# Patient Record
Sex: Female | Born: 1940 | Race: Black or African American | Hispanic: No | Smoking: Former smoker
Health system: Southern US, Community
[De-identification: ages and names within clinical notes are randomized; demographics above are authoritative.]

## PROBLEM LIST (undated history)

## (undated) DIAGNOSIS — E119 Type 2 diabetes mellitus without complications: Secondary | ICD-10-CM

## (undated) DIAGNOSIS — O0091 Unspecified ectopic pregnancy with intrauterine pregnancy: Secondary | ICD-10-CM

## (undated) DIAGNOSIS — I82409 Acute embolism and thrombosis of unspecified deep veins of unspecified lower extremity: Secondary | ICD-10-CM

## (undated) DIAGNOSIS — H9190 Unspecified hearing loss, unspecified ear: Secondary | ICD-10-CM

## (undated) DIAGNOSIS — K449 Diaphragmatic hernia without obstruction or gangrene: Secondary | ICD-10-CM

## (undated) DIAGNOSIS — F419 Anxiety disorder, unspecified: Secondary | ICD-10-CM

## (undated) DIAGNOSIS — E785 Hyperlipidemia, unspecified: Secondary | ICD-10-CM

## (undated) DIAGNOSIS — F329 Major depressive disorder, single episode, unspecified: Secondary | ICD-10-CM

## (undated) DIAGNOSIS — F32A Depression, unspecified: Secondary | ICD-10-CM

## (undated) DIAGNOSIS — M549 Dorsalgia, unspecified: Secondary | ICD-10-CM

## (undated) DIAGNOSIS — T8859XA Other complications of anesthesia, initial encounter: Secondary | ICD-10-CM

## (undated) DIAGNOSIS — G4730 Sleep apnea, unspecified: Secondary | ICD-10-CM

## (undated) DIAGNOSIS — I2699 Other pulmonary embolism without acute cor pulmonale: Secondary | ICD-10-CM

## (undated) DIAGNOSIS — R0981 Nasal congestion: Secondary | ICD-10-CM

## (undated) HISTORY — DX: Nasal congestion: R09.81

## (undated) HISTORY — DX: Type 2 diabetes mellitus without complications: E11.9

## (undated) HISTORY — PX: ECTOPIC PREGNANCY SURGERY: SHX613

## (undated) HISTORY — DX: Unspecified hearing loss, unspecified ear: H91.90

## (undated) HISTORY — DX: Sleep apnea, unspecified: G47.30

## (undated) HISTORY — PX: BUNIONECTOMY: SHX129

## (undated) HISTORY — PX: TUBAL LIGATION: SHX77

## (undated) HISTORY — DX: Other pulmonary embolism without acute cor pulmonale: I26.99

## (undated) HISTORY — DX: Major depressive disorder, single episode, unspecified: F32.9

## (undated) HISTORY — DX: Unspecified ectopic pregnancy with intrauterine pregnancy: O00.91

## (undated) HISTORY — DX: Depression, unspecified: F32.A

## (undated) HISTORY — PX: ABDOMINAL EXPLORATION SURGERY: SHX538

## (undated) HISTORY — DX: Anxiety disorder, unspecified: F41.9

## (undated) HISTORY — DX: Dorsalgia, unspecified: M54.9

## (undated) HISTORY — DX: Acute embolism and thrombosis of unspecified deep veins of unspecified lower extremity: I82.409

## (undated) HISTORY — DX: Diaphragmatic hernia without obstruction or gangrene: K44.9

## (undated) HISTORY — PX: FOOT SURGERY: SHX648

## (undated) HISTORY — DX: Hyperlipidemia, unspecified: E78.5

---

## 1983-02-20 HISTORY — PX: BUNIONECTOMY: SHX129

## 1983-02-20 HISTORY — PX: FOOT SURGERY: SHX648

## 1999-01-03 ENCOUNTER — Ambulatory Visit (HOSPITAL_COMMUNITY): Admission: RE | Admit: 1999-01-03 | Discharge: 1999-01-03 | Payer: Self-pay | Admitting: Internal Medicine

## 2000-04-30 ENCOUNTER — Other Ambulatory Visit: Admission: RE | Admit: 2000-04-30 | Discharge: 2000-04-30 | Payer: Self-pay | Admitting: Family Medicine

## 2000-05-01 ENCOUNTER — Encounter (INDEPENDENT_AMBULATORY_CARE_PROVIDER_SITE_OTHER): Payer: Self-pay

## 2000-05-01 ENCOUNTER — Other Ambulatory Visit: Admission: RE | Admit: 2000-05-01 | Discharge: 2000-05-01 | Payer: Self-pay | Admitting: Obstetrics & Gynecology

## 2001-05-16 ENCOUNTER — Other Ambulatory Visit: Admission: RE | Admit: 2001-05-16 | Discharge: 2001-05-16 | Payer: Self-pay | Admitting: Obstetrics & Gynecology

## 2002-05-21 ENCOUNTER — Other Ambulatory Visit: Admission: RE | Admit: 2002-05-21 | Discharge: 2002-05-21 | Payer: Self-pay | Admitting: Obstetrics & Gynecology

## 2002-10-08 ENCOUNTER — Encounter (INDEPENDENT_AMBULATORY_CARE_PROVIDER_SITE_OTHER): Payer: Self-pay | Admitting: Specialist

## 2002-10-08 ENCOUNTER — Ambulatory Visit (HOSPITAL_COMMUNITY): Admission: RE | Admit: 2002-10-08 | Discharge: 2002-10-08 | Payer: Self-pay | Admitting: Obstetrics & Gynecology

## 2003-06-22 ENCOUNTER — Other Ambulatory Visit: Admission: RE | Admit: 2003-06-22 | Discharge: 2003-06-22 | Payer: Self-pay | Admitting: Obstetrics & Gynecology

## 2003-12-12 ENCOUNTER — Emergency Department (HOSPITAL_COMMUNITY): Admission: EM | Admit: 2003-12-12 | Discharge: 2003-12-12 | Payer: Self-pay | Admitting: Family Medicine

## 2004-02-20 DIAGNOSIS — I82409 Acute embolism and thrombosis of unspecified deep veins of unspecified lower extremity: Secondary | ICD-10-CM

## 2004-02-20 DIAGNOSIS — I2699 Other pulmonary embolism without acute cor pulmonale: Secondary | ICD-10-CM

## 2004-02-20 HISTORY — DX: Other pulmonary embolism without acute cor pulmonale: I26.99

## 2004-02-20 HISTORY — DX: Acute embolism and thrombosis of unspecified deep veins of unspecified lower extremity: I82.409

## 2004-02-24 ENCOUNTER — Ambulatory Visit: Payer: Self-pay | Admitting: Internal Medicine

## 2004-02-28 ENCOUNTER — Encounter: Admission: RE | Admit: 2004-02-28 | Discharge: 2004-02-28 | Payer: Self-pay | Admitting: Internal Medicine

## 2004-03-01 ENCOUNTER — Ambulatory Visit: Payer: Self-pay

## 2004-03-09 ENCOUNTER — Ambulatory Visit: Payer: Self-pay | Admitting: Internal Medicine

## 2004-03-15 ENCOUNTER — Ambulatory Visit: Payer: Self-pay | Admitting: Internal Medicine

## 2004-03-20 ENCOUNTER — Ambulatory Visit: Payer: Self-pay | Admitting: Internal Medicine

## 2004-04-06 ENCOUNTER — Ambulatory Visit: Payer: Self-pay | Admitting: Family Medicine

## 2004-04-19 ENCOUNTER — Ambulatory Visit: Payer: Self-pay | Admitting: Internal Medicine

## 2004-04-21 ENCOUNTER — Inpatient Hospital Stay (HOSPITAL_COMMUNITY): Admission: EM | Admit: 2004-04-21 | Discharge: 2004-04-27 | Payer: Self-pay | Admitting: Emergency Medicine

## 2004-04-21 ENCOUNTER — Ambulatory Visit: Payer: Self-pay | Admitting: Internal Medicine

## 2004-04-21 ENCOUNTER — Encounter: Admission: RE | Admit: 2004-04-21 | Discharge: 2004-04-21 | Payer: Self-pay | Admitting: Internal Medicine

## 2004-05-01 ENCOUNTER — Ambulatory Visit: Payer: Self-pay | Admitting: Cardiology

## 2004-05-04 ENCOUNTER — Ambulatory Visit: Payer: Self-pay | Admitting: Internal Medicine

## 2004-05-08 ENCOUNTER — Ambulatory Visit: Payer: Self-pay | Admitting: Internal Medicine

## 2004-05-15 ENCOUNTER — Ambulatory Visit: Payer: Self-pay | Admitting: Cardiology

## 2004-05-24 ENCOUNTER — Ambulatory Visit: Payer: Self-pay | Admitting: *Deleted

## 2004-06-01 ENCOUNTER — Ambulatory Visit: Payer: Self-pay | Admitting: Internal Medicine

## 2004-06-01 ENCOUNTER — Ambulatory Visit: Payer: Self-pay | Admitting: Cardiology

## 2004-06-15 ENCOUNTER — Ambulatory Visit: Payer: Self-pay | Admitting: Cardiology

## 2004-06-26 ENCOUNTER — Ambulatory Visit: Payer: Self-pay | Admitting: Family Medicine

## 2004-07-05 ENCOUNTER — Ambulatory Visit: Payer: Self-pay | Admitting: Cardiology

## 2004-07-20 ENCOUNTER — Ambulatory Visit: Payer: Self-pay | Admitting: Internal Medicine

## 2004-07-26 ENCOUNTER — Other Ambulatory Visit: Admission: RE | Admit: 2004-07-26 | Discharge: 2004-07-26 | Payer: Self-pay | Admitting: Obstetrics & Gynecology

## 2004-08-02 ENCOUNTER — Ambulatory Visit: Payer: Self-pay | Admitting: *Deleted

## 2004-08-15 ENCOUNTER — Ambulatory Visit: Payer: Self-pay | Admitting: Cardiology

## 2004-09-12 ENCOUNTER — Ambulatory Visit: Payer: Self-pay | Admitting: Cardiology

## 2004-09-27 ENCOUNTER — Ambulatory Visit: Payer: Self-pay | Admitting: Cardiology

## 2004-10-11 ENCOUNTER — Ambulatory Visit: Payer: Self-pay | Admitting: Cardiology

## 2004-10-24 ENCOUNTER — Ambulatory Visit: Admission: RE | Admit: 2004-10-24 | Discharge: 2004-10-24 | Payer: Self-pay | Admitting: Internal Medicine

## 2004-10-24 ENCOUNTER — Ambulatory Visit: Payer: Self-pay | Admitting: Internal Medicine

## 2004-10-30 ENCOUNTER — Ambulatory Visit: Payer: Self-pay | Admitting: Family Medicine

## 2004-11-01 ENCOUNTER — Ambulatory Visit: Payer: Self-pay | Admitting: Cardiology

## 2004-11-13 ENCOUNTER — Ambulatory Visit: Payer: Self-pay | Admitting: Family Medicine

## 2004-11-17 ENCOUNTER — Ambulatory Visit: Payer: Self-pay | Admitting: Cardiology

## 2004-11-17 ENCOUNTER — Ambulatory Visit: Payer: Self-pay | Admitting: Internal Medicine

## 2004-12-01 ENCOUNTER — Encounter: Admission: RE | Admit: 2004-12-01 | Discharge: 2004-12-01 | Payer: Self-pay | Admitting: *Deleted

## 2004-12-01 ENCOUNTER — Ambulatory Visit: Payer: Self-pay

## 2004-12-04 ENCOUNTER — Ambulatory Visit (HOSPITAL_COMMUNITY): Admission: RE | Admit: 2004-12-04 | Discharge: 2004-12-04 | Payer: Self-pay | Admitting: *Deleted

## 2004-12-04 ENCOUNTER — Ambulatory Visit (HOSPITAL_BASED_OUTPATIENT_CLINIC_OR_DEPARTMENT_OTHER): Admission: RE | Admit: 2004-12-04 | Discharge: 2004-12-04 | Payer: Self-pay | Admitting: *Deleted

## 2004-12-14 ENCOUNTER — Ambulatory Visit: Payer: Self-pay | Admitting: Cardiology

## 2004-12-20 ENCOUNTER — Ambulatory Visit: Payer: Self-pay | Admitting: Cardiology

## 2004-12-20 ENCOUNTER — Ambulatory Visit: Payer: Self-pay | Admitting: *Deleted

## 2004-12-26 ENCOUNTER — Ambulatory Visit: Payer: Self-pay | Admitting: Cardiology

## 2004-12-26 ENCOUNTER — Ambulatory Visit (HOSPITAL_COMMUNITY): Admission: RE | Admit: 2004-12-26 | Discharge: 2004-12-26 | Payer: Self-pay | Admitting: Cardiology

## 2004-12-29 ENCOUNTER — Ambulatory Visit: Payer: Self-pay | Admitting: Internal Medicine

## 2004-12-29 ENCOUNTER — Ambulatory Visit (HOSPITAL_COMMUNITY): Admission: RE | Admit: 2004-12-29 | Discharge: 2004-12-29 | Payer: Self-pay | Admitting: Cardiology

## 2005-01-03 ENCOUNTER — Ambulatory Visit: Payer: Self-pay | Admitting: Cardiology

## 2005-01-08 ENCOUNTER — Ambulatory Visit: Payer: Self-pay | Admitting: Internal Medicine

## 2005-01-15 ENCOUNTER — Ambulatory Visit: Payer: Self-pay | Admitting: Cardiology

## 2005-01-18 ENCOUNTER — Ambulatory Visit: Payer: Self-pay | Admitting: Internal Medicine

## 2005-02-08 ENCOUNTER — Ambulatory Visit: Payer: Self-pay | Admitting: Cardiology

## 2005-03-13 ENCOUNTER — Ambulatory Visit: Payer: Self-pay | Admitting: *Deleted

## 2005-04-11 ENCOUNTER — Ambulatory Visit: Payer: Self-pay | Admitting: Internal Medicine

## 2005-05-14 ENCOUNTER — Ambulatory Visit: Payer: Self-pay | Admitting: Internal Medicine

## 2005-06-12 ENCOUNTER — Ambulatory Visit: Payer: Self-pay | Admitting: Cardiovascular Disease

## 2005-07-13 ENCOUNTER — Ambulatory Visit: Payer: Self-pay | Admitting: Cardiology

## 2005-07-19 ENCOUNTER — Ambulatory Visit: Payer: Self-pay | Admitting: Cardiology

## 2005-08-14 ENCOUNTER — Ambulatory Visit: Payer: Self-pay | Admitting: Cardiovascular Disease

## 2005-08-30 ENCOUNTER — Ambulatory Visit: Payer: Self-pay | Admitting: Cardiology

## 2005-09-13 ENCOUNTER — Ambulatory Visit: Payer: Self-pay | Admitting: Internal Medicine

## 2005-10-04 ENCOUNTER — Ambulatory Visit: Payer: Self-pay | Admitting: Cardiology

## 2005-10-19 ENCOUNTER — Ambulatory Visit: Payer: Self-pay | Admitting: Internal Medicine

## 2005-10-19 ENCOUNTER — Ambulatory Visit: Payer: Self-pay | Admitting: Cardiology

## 2005-11-08 ENCOUNTER — Ambulatory Visit: Payer: Self-pay | Admitting: Internal Medicine

## 2005-11-08 ENCOUNTER — Ambulatory Visit: Payer: Self-pay | Admitting: Cardiology

## 2005-11-22 ENCOUNTER — Encounter: Admission: RE | Admit: 2005-11-22 | Discharge: 2005-11-22 | Payer: Self-pay | Admitting: Obstetrics & Gynecology

## 2006-06-12 ENCOUNTER — Ambulatory Visit: Payer: Self-pay | Admitting: Internal Medicine

## 2006-06-12 LAB — CONVERTED CEMR LAB
ALT: 29 units/L (ref 0–40)
AST: 29 units/L (ref 0–37)
Cholesterol: 236 mg/dL (ref 0–200)
Direct LDL: 97.5 mg/dL
HDL: 111 mg/dL (ref >39.0)
Total CHOL/HDL Ratio: 2.1
Triglycerides: 59 mg/dL (ref 0–149)
VLDL: 12 mg/dL (ref 0–40)

## 2006-08-20 ENCOUNTER — Ambulatory Visit: Payer: Self-pay | Admitting: Family Medicine

## 2006-08-20 DIAGNOSIS — O0081 Other ectopic pregnancy with intrauterine pregnancy: Secondary | ICD-10-CM

## 2006-08-20 DIAGNOSIS — R350 Frequency of micturition: Secondary | ICD-10-CM

## 2006-08-20 DIAGNOSIS — R7989 Other specified abnormal findings of blood chemistry: Secondary | ICD-10-CM

## 2006-08-20 DIAGNOSIS — Z86718 Personal history of other venous thrombosis and embolism: Secondary | ICD-10-CM

## 2006-08-20 DIAGNOSIS — E785 Hyperlipidemia, unspecified: Secondary | ICD-10-CM

## 2006-08-20 LAB — CONVERTED CEMR LAB
Bilirubin Urine: NEGATIVE
Blood Glucose, Fasting: 107 mg/dL
Blood in Urine, dipstick: NEGATIVE
Glucose, Urine, Semiquant: NEGATIVE
Ketones, urine, test strip: NEGATIVE
Nitrite: NEGATIVE
Protein, U semiquant: NEGATIVE
Specific Gravity, Urine: 1.01
Urobilinogen, UA: NEGATIVE
pH: 6

## 2006-08-21 LAB — CONVERTED CEMR LAB
ALT: 26 units/L (ref 0–35)
AST: 23 units/L (ref 0–37)
Albumin: 3.8 g/dL (ref 3.5–5.2)
Alkaline Phosphatase: 63 units/L (ref 39–117)
BUN: 18 mg/dL (ref 6–23)
Basophils Absolute: 0 10*3/uL (ref 0.0–0.1)
Basophils Relative: 1 % (ref 0.0–1.0)
Bilirubin, Direct: 0.1 mg/dL (ref 0.0–0.3)
CO2: 32 meq/L (ref 19–32)
Calcium: 9.5 mg/dL (ref 8.4–10.5)
Chloride: 111 meq/L (ref 96–112)
Cholesterol: 234 mg/dL (ref 0–200)
Creatinine, Ser: 1 mg/dL (ref 0.4–1.2)
Direct LDL: 84.8 mg/dL
Eosinophils Absolute: 0.1 10*3/uL (ref 0.0–0.6)
Eosinophils Relative: 2.6 % (ref 0.0–5.0)
GFR calc Af Amer: 72 mL/min
GFR calc non Af Amer: 59 mL/min
Glucose, Bld: 91 mg/dL (ref 70–99)
HCT: 41 % (ref 36.0–46.0)
HDL: 131.5 mg/dL (ref >39.0)
Hemoglobin: 13.5 g/dL (ref 12.0–15.0)
Hgb A1c MFr Bld: 6.5 % — ABNORMAL HIGH (ref 4.6–6.0)
Lymphocytes Relative: 44 % (ref 12.0–46.0)
MCHC: 33 g/dL (ref 30.0–36.0)
MCV: 94.5 fL (ref 78.0–100.0)
Monocytes Absolute: 0.4 10*3/uL (ref 0.2–0.7)
Monocytes Relative: 9 % (ref 3.0–11.0)
Neutro Abs: 2.1 10*3/uL (ref 1.4–7.7)
Neutrophils Relative %: 43.4 % (ref 43.0–77.0)
Platelets: 244 10*3/uL (ref 150–400)
Potassium: 4.4 meq/L (ref 3.5–5.1)
RBC: 4.33 M/uL (ref 3.87–5.11)
RDW: 12.7 % (ref 11.5–14.6)
Sodium: 145 meq/L (ref 135–145)
TSH: 1.16 microintl units/mL (ref 0.35–5.50)
Total Bilirubin: 0.9 mg/dL (ref 0.3–1.2)
Total CHOL/HDL Ratio: 1.8
Total Protein: 7 g/dL (ref 6.0–8.3)
Triglycerides: 59 mg/dL (ref 0–149)
VLDL: 12 mg/dL (ref 0–40)
WBC: 4.9 10*3/uL (ref 4.5–10.5)

## 2006-08-28 ENCOUNTER — Ambulatory Visit: Payer: Self-pay | Admitting: Family Medicine

## 2006-08-28 LAB — CONVERTED CEMR LAB
Bilirubin Urine: NEGATIVE
Blood in Urine, dipstick: NEGATIVE
Glucose, Urine, Semiquant: NEGATIVE
Ketones, urine, test strip: NEGATIVE
Nitrite: NEGATIVE
Protein, U semiquant: NEGATIVE
Specific Gravity, Urine: 1.01
Urobilinogen, UA: NEGATIVE
pH: 7

## 2006-09-02 ENCOUNTER — Telehealth (INDEPENDENT_AMBULATORY_CARE_PROVIDER_SITE_OTHER): Payer: Self-pay | Admitting: *Deleted

## 2006-09-04 ENCOUNTER — Encounter (INDEPENDENT_AMBULATORY_CARE_PROVIDER_SITE_OTHER): Payer: Self-pay | Admitting: *Deleted

## 2006-09-19 ENCOUNTER — Ambulatory Visit: Payer: Self-pay | Admitting: Family Medicine

## 2006-09-19 DIAGNOSIS — Z87440 Personal history of urinary (tract) infections: Secondary | ICD-10-CM | POA: Insufficient documentation

## 2006-09-19 LAB — CONVERTED CEMR LAB
Bilirubin Urine: NEGATIVE
Blood in Urine, dipstick: NEGATIVE
Glucose, Urine, Semiquant: NEGATIVE
Ketones, urine, test strip: NEGATIVE
Nitrite: NEGATIVE
Protein, U semiquant: NEGATIVE
Specific Gravity, Urine: 1.015
Urobilinogen, UA: NEGATIVE
pH: 6

## 2006-09-20 ENCOUNTER — Encounter: Payer: Self-pay | Admitting: Family Medicine

## 2006-09-23 ENCOUNTER — Encounter (INDEPENDENT_AMBULATORY_CARE_PROVIDER_SITE_OTHER): Payer: Self-pay | Admitting: *Deleted

## 2006-10-22 ENCOUNTER — Telehealth (INDEPENDENT_AMBULATORY_CARE_PROVIDER_SITE_OTHER): Payer: Self-pay | Admitting: *Deleted

## 2006-10-22 ENCOUNTER — Telehealth: Payer: Self-pay | Admitting: Family Medicine

## 2007-01-10 ENCOUNTER — Ambulatory Visit: Payer: Self-pay | Admitting: Family Medicine

## 2007-01-27 ENCOUNTER — Encounter: Payer: Self-pay | Admitting: Family Medicine

## 2007-02-18 ENCOUNTER — Ambulatory Visit: Payer: Self-pay | Admitting: Internal Medicine

## 2007-02-20 HISTORY — PX: COLONOSCOPY: SHX174

## 2007-02-24 ENCOUNTER — Telehealth (INDEPENDENT_AMBULATORY_CARE_PROVIDER_SITE_OTHER): Payer: Self-pay | Admitting: *Deleted

## 2007-02-25 ENCOUNTER — Ambulatory Visit: Payer: Self-pay | Admitting: Internal Medicine

## 2007-02-26 LAB — CONVERTED CEMR LAB
Basophils Absolute: 0 10*3/uL (ref 0.0–0.1)
Basophils Relative: 0.6 % (ref 0.0–1.0)
Eosinophils Absolute: 0.2 10*3/uL (ref 0.0–0.6)
Eosinophils Relative: 3.1 % (ref 0.0–5.0)
HCT: 38.9 % (ref 36.0–46.0)
Hemoglobin: 13.3 g/dL (ref 12.0–15.0)
Lymphocytes Relative: 40.9 % (ref 12.0–46.0)
MCHC: 34.3 g/dL (ref 30.0–36.0)
MCV: 94.8 fL (ref 78.0–100.0)
Monocytes Absolute: 0.5 10*3/uL (ref 0.2–0.7)
Monocytes Relative: 9.9 % (ref 3.0–11.0)
Neutro Abs: 2.4 10*3/uL (ref 1.4–7.7)
Neutrophils Relative %: 45.5 % (ref 43.0–77.0)
Platelets: 233 10*3/uL (ref 150–400)
RBC: 4.1 M/uL (ref 3.87–5.11)
RDW: 12.8 % (ref 11.5–14.6)
WBC: 5.3 10*3/uL (ref 4.5–10.5)

## 2007-03-03 ENCOUNTER — Telehealth (INDEPENDENT_AMBULATORY_CARE_PROVIDER_SITE_OTHER): Payer: Self-pay | Admitting: *Deleted

## 2007-03-04 ENCOUNTER — Encounter: Payer: Self-pay | Admitting: Family Medicine

## 2007-03-04 ENCOUNTER — Ambulatory Visit: Payer: Self-pay | Admitting: Internal Medicine

## 2007-05-29 ENCOUNTER — Encounter: Payer: Self-pay | Admitting: Internal Medicine

## 2007-07-16 ENCOUNTER — Ambulatory Visit: Payer: Self-pay | Admitting: Family Medicine

## 2007-07-27 LAB — CONVERTED CEMR LAB
BUN: 21 mg/dL (ref 6–23)
CO2: 28 meq/L (ref 19–32)
Calcium: 9.7 mg/dL (ref 8.4–10.5)
Chloride: 103 meq/L (ref 96–112)
Cholesterol: 246 mg/dL (ref 0–200)
Creatinine, Ser: 1.1 mg/dL (ref 0.4–1.2)
Direct LDL: 113.6 mg/dL
GFR calc Af Amer: 64 mL/min
GFR calc non Af Amer: 53 mL/min
Glucose, Bld: 109 mg/dL — ABNORMAL HIGH (ref 70–99)
HDL: 116.5 mg/dL (ref >39.0)
Potassium: 4.1 meq/L (ref 3.5–5.1)
Sodium: 140 meq/L (ref 135–145)
Total CHOL/HDL Ratio: 2.1
Triglycerides: 72 mg/dL (ref 0–149)
VLDL: 14 mg/dL (ref 0–40)

## 2007-07-28 ENCOUNTER — Encounter (INDEPENDENT_AMBULATORY_CARE_PROVIDER_SITE_OTHER): Payer: Self-pay | Admitting: *Deleted

## 2007-08-07 ENCOUNTER — Encounter: Payer: Self-pay | Admitting: Internal Medicine

## 2007-08-07 ENCOUNTER — Ambulatory Visit: Payer: Self-pay | Admitting: Family Medicine

## 2007-08-07 LAB — CONVERTED CEMR LAB
BUN: 14 mg/dL (ref 6–23)
Basophils Absolute: 0 10*3/uL (ref 0.0–0.1)
Basophils Relative: 0.5 % (ref 0.0–1.0)
CO2: 25 meq/L (ref 19–32)
Calcium: 9.6 mg/dL (ref 8.4–10.5)
Chloride: 105 meq/L (ref 96–112)
Creatinine, Ser: 1 mg/dL (ref 0.4–1.2)
Eosinophils Absolute: 0.2 10*3/uL (ref 0.0–0.7)
Eosinophils Relative: 3.3 % (ref 0.0–5.0)
GFR calc Af Amer: 72 mL/min
GFR calc non Af Amer: 59 mL/min
Glucose, Bld: 110 mg/dL — ABNORMAL HIGH (ref 70–99)
HCT: 39.6 % (ref 36.0–46.0)
Hemoglobin: 13.7 g/dL (ref 12.0–15.0)
Hgb A1c MFr Bld: 6.4 % — ABNORMAL HIGH (ref 4.6–6.0)
Lymphocytes Relative: 34.6 % (ref 12.0–46.0)
MCHC: 34.5 g/dL (ref 30.0–36.0)
MCV: 94.6 fL (ref 78.0–100.0)
Monocytes Absolute: 0.4 10*3/uL (ref 0.1–1.0)
Monocytes Relative: 8 % (ref 3.0–12.0)
Neutro Abs: 3 10*3/uL (ref 1.4–7.7)
Neutrophils Relative %: 53.6 % (ref 43.0–77.0)
Platelets: 207 10*3/uL (ref 150–400)
Potassium: 4.1 meq/L (ref 3.5–5.1)
RBC: 4.18 M/uL (ref 3.87–5.11)
RDW: 13.1 % (ref 11.5–14.6)
Sodium: 140 meq/L (ref 135–145)
TSH: 1.61 microintl units/mL (ref 0.35–5.50)
WBC: 5.5 10*3/uL (ref 4.5–10.5)

## 2007-08-08 ENCOUNTER — Encounter (INDEPENDENT_AMBULATORY_CARE_PROVIDER_SITE_OTHER): Payer: Self-pay | Admitting: *Deleted

## 2007-08-14 ENCOUNTER — Ambulatory Visit: Payer: Self-pay | Admitting: Family Medicine

## 2007-09-01 ENCOUNTER — Telehealth (INDEPENDENT_AMBULATORY_CARE_PROVIDER_SITE_OTHER): Payer: Self-pay | Admitting: *Deleted

## 2007-11-04 ENCOUNTER — Telehealth (INDEPENDENT_AMBULATORY_CARE_PROVIDER_SITE_OTHER): Payer: Self-pay | Admitting: *Deleted

## 2007-11-10 ENCOUNTER — Encounter (INDEPENDENT_AMBULATORY_CARE_PROVIDER_SITE_OTHER): Payer: Self-pay | Admitting: *Deleted

## 2007-11-13 ENCOUNTER — Ambulatory Visit: Payer: Self-pay | Admitting: Family Medicine

## 2007-11-25 ENCOUNTER — Encounter (INDEPENDENT_AMBULATORY_CARE_PROVIDER_SITE_OTHER): Payer: Self-pay | Admitting: *Deleted

## 2007-11-25 LAB — CONVERTED CEMR LAB
BUN: 24 mg/dL — ABNORMAL HIGH (ref 6–23)
CO2: 25 meq/L (ref 19–32)
Calcium: 9.4 mg/dL (ref 8.4–10.5)
Chloride: 105 meq/L (ref 96–112)
Creatinine, Ser: 1 mg/dL (ref 0.4–1.2)
GFR calc Af Amer: 71 mL/min
GFR calc non Af Amer: 59 mL/min
Glucose, Bld: 115 mg/dL — ABNORMAL HIGH (ref 70–99)
Hgb A1c MFr Bld: 6.2 % — ABNORMAL HIGH (ref 4.6–6.0)
Potassium: 4.2 meq/L (ref 3.5–5.1)
Sodium: 140 meq/L (ref 135–145)

## 2008-01-07 ENCOUNTER — Telehealth: Payer: Self-pay | Admitting: Family Medicine

## 2008-03-29 ENCOUNTER — Ambulatory Visit: Payer: Self-pay | Admitting: Family Medicine

## 2008-03-30 ENCOUNTER — Telehealth (INDEPENDENT_AMBULATORY_CARE_PROVIDER_SITE_OTHER): Payer: Self-pay | Admitting: *Deleted

## 2008-03-30 ENCOUNTER — Telehealth: Payer: Self-pay | Admitting: Family Medicine

## 2008-03-30 LAB — CONVERTED CEMR LAB
ALT: 22 units/L (ref 0–35)
AST: 24 units/L (ref 0–37)
Albumin: 3.9 g/dL (ref 3.5–5.2)
Alkaline Phosphatase: 87 units/L (ref 39–117)
BUN: 18 mg/dL (ref 6–23)
Bilirubin, Direct: 0.1 mg/dL (ref 0.0–0.3)
CO2: 28 meq/L (ref 19–32)
Calcium: 9.6 mg/dL (ref 8.4–10.5)
Chloride: 103 meq/L (ref 96–112)
Cholesterol: 274 mg/dL (ref 0–200)
Creatinine, Ser: 0.9 mg/dL (ref 0.4–1.2)
Direct LDL: 142.1 mg/dL
GFR calc Af Amer: 81 mL/min
GFR calc non Af Amer: 67 mL/min
Glucose, Bld: 103 mg/dL — ABNORMAL HIGH (ref 70–99)
HDL: 125.8 mg/dL (ref >39.0)
Hgb A1c MFr Bld: 6.3 % — ABNORMAL HIGH (ref 4.6–6.0)
Potassium: 4.4 meq/L (ref 3.5–5.1)
Sodium: 138 meq/L (ref 135–145)
Total Bilirubin: 0.7 mg/dL (ref 0.3–1.2)
Total CHOL/HDL Ratio: 2.2
Total Protein: 7 g/dL (ref 6.0–8.3)
Triglycerides: 63 mg/dL (ref 0–149)
VLDL: 13 mg/dL (ref 0–40)

## 2008-06-01 ENCOUNTER — Telehealth (INDEPENDENT_AMBULATORY_CARE_PROVIDER_SITE_OTHER): Payer: Self-pay | Admitting: *Deleted

## 2008-07-27 ENCOUNTER — Encounter: Payer: Self-pay | Admitting: Family Medicine

## 2008-07-27 ENCOUNTER — Telehealth: Payer: Self-pay | Admitting: Family Medicine

## 2008-07-27 ENCOUNTER — Ambulatory Visit: Payer: Self-pay | Admitting: Vascular Surgery

## 2008-07-27 ENCOUNTER — Ambulatory Visit (HOSPITAL_COMMUNITY): Admission: RE | Admit: 2008-07-27 | Discharge: 2008-07-27 | Payer: Self-pay | Admitting: Family Medicine

## 2008-07-27 ENCOUNTER — Ambulatory Visit: Payer: Self-pay | Admitting: Family Medicine

## 2008-07-27 DIAGNOSIS — R609 Edema, unspecified: Secondary | ICD-10-CM

## 2008-07-29 ENCOUNTER — Ambulatory Visit: Payer: Self-pay | Admitting: Family Medicine

## 2008-08-01 LAB — CONVERTED CEMR LAB
ALT: 19 units/L (ref 0–35)
AST: 23 units/L (ref 0–37)
Albumin: 3.9 g/dL (ref 3.5–5.2)
Alkaline Phosphatase: 98 units/L (ref 39–117)
BUN: 16 mg/dL (ref 6–23)
Basophils Absolute: 0 10*3/uL (ref 0.0–0.1)
Basophils Relative: 0.9 % (ref 0.0–3.0)
Bilirubin, Direct: 0 mg/dL (ref 0.0–0.3)
CO2: 31 meq/L (ref 19–32)
Calcium: 9.8 mg/dL (ref 8.4–10.5)
Chloride: 108 meq/L (ref 96–112)
Cholesterol: 266 mg/dL — ABNORMAL HIGH (ref 0–200)
Cholesterol: 255 mg/dL — ABNORMAL HIGH (ref 0–200)
Creatinine, Ser: 0.9 mg/dL (ref 0.4–1.2)
Direct LDL: 138.1 mg/dL
Direct LDL: 139 mg/dL
Eosinophils Absolute: 0.3 10*3/uL (ref 0.0–0.7)
Eosinophils Relative: 4.8 % (ref 0.0–5.0)
GFR calc non Af Amer: 80.35 mL/min (ref >60)
Glucose, Bld: 74 mg/dL (ref 70–99)
HCT: 41 % (ref 36.0–46.0)
HDL: 112.5 mg/dL (ref >39.00)
HDL: 110.2 mg/dL (ref >39.00)
Hemoglobin: 14.5 g/dL (ref 12.0–15.0)
INR: 1 (ref 0.8–1.0)
Lymphocytes Relative: 40.5 % (ref 12.0–46.0)
Lymphs Abs: 2.2 10*3/uL (ref 0.7–4.0)
MCHC: 35.4 g/dL (ref 30.0–36.0)
MCV: 96.3 fL (ref 78.0–100.0)
Monocytes Absolute: 0.5 10*3/uL (ref 0.1–1.0)
Monocytes Relative: 9.6 % (ref 3.0–12.0)
Neutro Abs: 2.5 10*3/uL (ref 1.4–7.7)
Neutrophils Relative %: 44.2 % (ref 43.0–77.0)
Platelets: 202 10*3/uL (ref 150.0–400.0)
Potassium: 4.2 meq/L (ref 3.5–5.1)
Prothrombin Time: 10.5 s — ABNORMAL LOW (ref 10.9–13.3)
RBC: 4.25 M/uL (ref 3.87–5.11)
RDW: 12.4 % (ref 11.5–14.6)
Sodium: 143 meq/L (ref 135–145)
Total Bilirubin: 0.9 mg/dL (ref 0.3–1.2)
Total CHOL/HDL Ratio: 2
Total CHOL/HDL Ratio: 2
Total Protein: 7.2 g/dL (ref 6.0–8.3)
Triglycerides: 178 mg/dL — ABNORMAL HIGH (ref 0.0–149.0)
Triglycerides: 75 mg/dL (ref 0.0–149.0)
VLDL: 35.6 mg/dL (ref 0.0–40.0)
VLDL: 15 mg/dL (ref 0.0–40.0)
WBC: 5.5 10*3/uL (ref 4.5–10.5)
aPTT: 30.9 s — ABNORMAL HIGH (ref 21.7–28.8)

## 2008-08-02 ENCOUNTER — Encounter (INDEPENDENT_AMBULATORY_CARE_PROVIDER_SITE_OTHER): Payer: Self-pay | Admitting: *Deleted

## 2009-01-11 ENCOUNTER — Ambulatory Visit: Payer: Self-pay | Admitting: Family Medicine

## 2009-01-11 DIAGNOSIS — S139XXA Sprain of joints and ligaments of unspecified parts of neck, initial encounter: Secondary | ICD-10-CM | POA: Insufficient documentation

## 2009-01-12 ENCOUNTER — Ambulatory Visit: Payer: Self-pay | Admitting: Family Medicine

## 2009-01-12 ENCOUNTER — Encounter (INDEPENDENT_AMBULATORY_CARE_PROVIDER_SITE_OTHER): Payer: Self-pay | Admitting: *Deleted

## 2009-01-12 DIAGNOSIS — I6529 Occlusion and stenosis of unspecified carotid artery: Secondary | ICD-10-CM

## 2009-01-21 ENCOUNTER — Encounter: Payer: Self-pay | Admitting: Family Medicine

## 2009-01-21 ENCOUNTER — Ambulatory Visit: Payer: Self-pay

## 2009-01-25 ENCOUNTER — Telehealth (INDEPENDENT_AMBULATORY_CARE_PROVIDER_SITE_OTHER): Payer: Self-pay | Admitting: *Deleted

## 2009-01-28 ENCOUNTER — Encounter (INDEPENDENT_AMBULATORY_CARE_PROVIDER_SITE_OTHER): Payer: Self-pay | Admitting: *Deleted

## 2009-03-08 ENCOUNTER — Encounter: Payer: Self-pay | Admitting: Family Medicine

## 2009-03-31 ENCOUNTER — Ambulatory Visit: Payer: Self-pay | Admitting: Family Medicine

## 2009-03-31 DIAGNOSIS — M5412 Radiculopathy, cervical region: Secondary | ICD-10-CM | POA: Insufficient documentation

## 2009-04-04 ENCOUNTER — Telehealth (INDEPENDENT_AMBULATORY_CARE_PROVIDER_SITE_OTHER): Payer: Self-pay | Admitting: *Deleted

## 2009-04-04 ENCOUNTER — Encounter: Admission: RE | Admit: 2009-04-04 | Discharge: 2009-04-04 | Payer: Self-pay | Admitting: Family Medicine

## 2009-04-13 ENCOUNTER — Telehealth: Payer: Self-pay | Admitting: Family Medicine

## 2009-04-15 ENCOUNTER — Telehealth: Payer: Self-pay | Admitting: Family Medicine

## 2009-04-19 ENCOUNTER — Telehealth (INDEPENDENT_AMBULATORY_CARE_PROVIDER_SITE_OTHER): Payer: Self-pay | Admitting: *Deleted

## 2009-04-20 ENCOUNTER — Ambulatory Visit: Payer: Self-pay | Admitting: Internal Medicine

## 2009-04-20 DIAGNOSIS — D692 Other nonthrombocytopenic purpura: Secondary | ICD-10-CM | POA: Insufficient documentation

## 2009-04-20 DIAGNOSIS — R21 Rash and other nonspecific skin eruption: Secondary | ICD-10-CM | POA: Insufficient documentation

## 2009-04-21 ENCOUNTER — Encounter: Payer: Self-pay | Admitting: Internal Medicine

## 2009-04-22 LAB — CONVERTED CEMR LAB
Basophils Absolute: 0 10*3/uL (ref 0.0–0.1)
Basophils Relative: 1 % (ref 0–1)
Eosinophils Absolute: 0.2 10*3/uL (ref 0.0–0.7)
Eosinophils Relative: 4 % (ref 0–5)
HCT: 47.4 % — ABNORMAL HIGH (ref 36.0–46.0)
Hemoglobin: 15.6 g/dL — ABNORMAL HIGH (ref 12.0–15.0)
INR: 1.03 (ref <1.50)
Lymphocytes Relative: 56 % — ABNORMAL HIGH (ref 12–46)
Lymphs Abs: 2.6 10*3/uL (ref 0.7–4.0)
MCHC: 32.9 g/dL (ref 30.0–36.0)
MCV: 98.1 fL (ref 78.0–100.0)
Monocytes Absolute: 0.6 10*3/uL (ref 0.1–1.0)
Monocytes Relative: 14 % — ABNORMAL HIGH (ref 3–12)
Neutro Abs: 1.2 10*3/uL — ABNORMAL LOW (ref 1.7–7.7)
Neutrophils Relative %: 26 % — ABNORMAL LOW (ref 43–77)
Platelets: 233 10*3/uL (ref 150–400)
Prothrombin Time: 13.4 s (ref 11.6–15.2)
RBC: 4.83 M/uL (ref 3.87–5.11)
RDW: 13.5 % (ref 11.5–15.5)
WBC: 4.7 10*3/uL (ref 4.0–10.5)
aPTT: 26 s (ref 24–37)

## 2009-05-02 ENCOUNTER — Ambulatory Visit: Payer: Self-pay

## 2009-05-02 ENCOUNTER — Telehealth (INDEPENDENT_AMBULATORY_CARE_PROVIDER_SITE_OTHER): Payer: Self-pay | Admitting: *Deleted

## 2009-05-02 ENCOUNTER — Ambulatory Visit: Payer: Self-pay | Admitting: Family Medicine

## 2009-05-02 ENCOUNTER — Telehealth: Payer: Self-pay | Admitting: Family Medicine

## 2009-05-04 ENCOUNTER — Telehealth: Payer: Self-pay | Admitting: Family Medicine

## 2009-05-13 ENCOUNTER — Encounter (INDEPENDENT_AMBULATORY_CARE_PROVIDER_SITE_OTHER): Payer: Self-pay | Admitting: *Deleted

## 2009-05-13 ENCOUNTER — Ambulatory Visit: Payer: Self-pay

## 2009-05-13 ENCOUNTER — Ambulatory Visit: Payer: Self-pay | Admitting: Cardiology

## 2009-05-13 ENCOUNTER — Ambulatory Visit (HOSPITAL_COMMUNITY): Admission: RE | Admit: 2009-05-13 | Discharge: 2009-05-13 | Payer: Self-pay | Admitting: Family Medicine

## 2009-05-13 ENCOUNTER — Encounter: Payer: Self-pay | Admitting: Family Medicine

## 2009-05-15 DIAGNOSIS — I5032 Chronic diastolic (congestive) heart failure: Secondary | ICD-10-CM | POA: Insufficient documentation

## 2009-05-16 ENCOUNTER — Encounter (INDEPENDENT_AMBULATORY_CARE_PROVIDER_SITE_OTHER): Payer: Self-pay | Admitting: *Deleted

## 2009-06-08 ENCOUNTER — Ambulatory Visit: Payer: Self-pay | Admitting: Cardiology

## 2009-06-09 ENCOUNTER — Telehealth: Payer: Self-pay | Admitting: Cardiology

## 2009-06-09 ENCOUNTER — Encounter: Payer: Self-pay | Admitting: Cardiology

## 2009-07-01 ENCOUNTER — Telehealth: Payer: Self-pay | Admitting: Cardiology

## 2009-07-04 ENCOUNTER — Telehealth: Payer: Self-pay | Admitting: Cardiology

## 2009-07-21 ENCOUNTER — Encounter: Payer: Self-pay | Admitting: Family Medicine

## 2009-08-24 ENCOUNTER — Encounter: Payer: Self-pay | Admitting: Family Medicine

## 2009-08-29 ENCOUNTER — Encounter: Payer: Self-pay | Admitting: Family Medicine

## 2009-11-14 ENCOUNTER — Ambulatory Visit: Payer: Self-pay | Admitting: Family Medicine

## 2009-11-22 ENCOUNTER — Ambulatory Visit: Payer: Self-pay | Admitting: Family Medicine

## 2009-11-22 ENCOUNTER — Encounter (INDEPENDENT_AMBULATORY_CARE_PROVIDER_SITE_OTHER): Payer: Self-pay | Admitting: *Deleted

## 2009-11-23 LAB — CONVERTED CEMR LAB: Fecal Occult Bld: NEGATIVE

## 2009-12-05 ENCOUNTER — Encounter: Payer: Self-pay | Admitting: Family Medicine

## 2010-01-02 ENCOUNTER — Telehealth (INDEPENDENT_AMBULATORY_CARE_PROVIDER_SITE_OTHER): Payer: Self-pay | Admitting: *Deleted

## 2010-03-11 ENCOUNTER — Encounter: Payer: Self-pay | Admitting: Obstetrics & Gynecology

## 2010-03-19 LAB — CONVERTED CEMR LAB
ALT: 20 units/L (ref 0–35)
ALT: 35 units/L (ref 0–35)
AST: 25 units/L (ref 0–37)
AST: 33 units/L (ref 0–37)
Albumin: 4 g/dL (ref 3.5–5.2)
Albumin: 4.5 g/dL (ref 3.5–5.2)
Alkaline Phosphatase: 69 units/L (ref 39–117)
Alkaline Phosphatase: 99 units/L (ref 39–117)
BUN: 14 mg/dL (ref 6–23)
BUN: 15 mg/dL (ref 6–23)
BUN: 16 mg/dL (ref 6–23)
BUN: 17 mg/dL (ref 6–23)
Basophils Absolute: 0 10*3/uL (ref 0.0–0.1)
Basophils Relative: 0.9 % (ref 0.0–1.0)
Bilirubin, Direct: 0.1 mg/dL (ref 0.0–0.3)
Bilirubin, Direct: 0.3 mg/dL (ref 0.0–0.3)
CO2: 27 meq/L (ref 19–32)
CO2: 27 meq/L (ref 19–32)
CO2: 28 meq/L (ref 19–32)
CO2: 28 meq/L (ref 19–32)
Calcium: 10 mg/dL (ref 8.4–10.5)
Calcium: 10.3 mg/dL (ref 8.4–10.5)
Calcium: 9.6 mg/dL (ref 8.4–10.5)
Calcium: 9.6 mg/dL (ref 8.4–10.5)
Chloride: 101 meq/L (ref 96–112)
Chloride: 103 meq/L (ref 96–112)
Chloride: 105 meq/L (ref 96–112)
Chloride: 108 meq/L (ref 96–112)
Cholesterol: 221 mg/dL (ref 0–200)
Creatinine, Ser: 1 mg/dL (ref 0.4–1.2)
Creatinine, Ser: 1 mg/dL (ref 0.4–1.2)
Creatinine, Ser: 1 mg/dL (ref 0.4–1.2)
Creatinine, Ser: 1 mg/dL (ref 0.4–1.2)
Direct LDL: 90.1 mg/dL
Eosinophils Absolute: 0.2 10*3/uL (ref 0.0–0.6)
Eosinophils Relative: 3.6 % (ref 0.0–5.0)
GFR calc Af Amer: 72 mL/min
GFR calc non Af Amer: 59 mL/min
GFR calc non Af Amer: 70.97 mL/min (ref >60)
GFR calc non Af Amer: 70.99 mL/min (ref >60)
GFR calc non Af Amer: 71.05 mL/min (ref >60)
Glucose, Bld: 90 mg/dL (ref 70–99)
Glucose, Bld: 94 mg/dL (ref 70–99)
Glucose, Bld: 96 mg/dL (ref 70–99)
Glucose, Bld: 97 mg/dL (ref 70–99)
HCT: 39.9 % (ref 36.0–46.0)
HDL: 134.3 mg/dL (ref >39.0)
Hemoglobin: 13.7 g/dL (ref 12.0–15.0)
Lymphocytes Relative: 49.6 % — ABNORMAL HIGH (ref 12.0–46.0)
MCHC: 34.4 g/dL (ref 30.0–36.0)
MCV: 93.6 fL (ref 78.0–100.0)
Monocytes Absolute: 0.4 10*3/uL (ref 0.2–0.7)
Monocytes Relative: 9.8 % (ref 3.0–11.0)
Neutro Abs: 1.6 10*3/uL (ref 1.4–7.7)
Neutrophils Relative %: 36.1 % — ABNORMAL LOW (ref 43.0–77.0)
Pap Smear: NORMAL
Platelets: 223 10*3/uL (ref 150–400)
Potassium: 4 meq/L (ref 3.5–5.1)
Potassium: 4.3 meq/L (ref 3.5–5.1)
Potassium: 4.5 meq/L (ref 3.5–5.1)
Potassium: 4.7 meq/L (ref 3.5–5.1)
Pro B Natriuretic peptide (BNP): 46 pg/mL (ref 0.0–100.0)
RBC: 4.26 M/uL (ref 3.87–5.11)
RDW: 13.1 % (ref 11.5–14.6)
Sodium: 138 meq/L (ref 135–145)
Sodium: 141 meq/L (ref 135–145)
Sodium: 141 meq/L (ref 135–145)
Sodium: 143 meq/L (ref 135–145)
TSH: 1.08 microintl units/mL (ref 0.35–5.50)
Total Bilirubin: 1.2 mg/dL (ref 0.3–1.2)
Total Bilirubin: 1.3 mg/dL — ABNORMAL HIGH (ref 0.3–1.2)
Total CHOL/HDL Ratio: 1.6
Total Protein: 7.1 g/dL (ref 6.0–8.3)
Total Protein: 7.1 g/dL (ref 6.0–8.3)
Triglycerides: 54 mg/dL (ref 0–149)
VLDL: 11 mg/dL (ref 0–40)
WBC: 4.5 10*3/uL (ref 4.5–10.5)

## 2010-03-23 NOTE — Assessment & Plan Note (Signed)
Summary: body pains- jr   Vital Signs:  Patient profile:   70 year old female Weight:      165 pounds Temp:     97.7 degrees F oral Pulse rate:   86 / minute Pulse rhythm:   regular BP sitting:   128 / 74  (left arm) Cuff size:   large  Vitals Entered By: Army Fossa CMA (March 31, 2009 4:10 PM) CC: Pain in arms and shoulders since october- saw ortho and PT did not help. Hands numb in the am. Comments Is only taking ASA, Vitamin D, Calicum and Flexeril- she stopped all other medications to see if that would stop the pain.    History of Present Illness: Pt here to f/u neck pain with b/l arm and hand numbness.  See previous visit about this.  Pt never went to chiropractor ---she went to ortho instead who sent her to PT.  Pt is no better and feels like it is actually getting worse.  Notes from ortho reviewed.    Current Medications (verified): 1)  Maxzide-25 37.5-25 Mg  Tabs (Triamterene-Hctz) .... 1/2 By Mouth Once Daily 2)  Celexa 40 Mg  Tabs (Citalopram Hydrobromide) .Marland Kitchen.. 1 Once Daily 3)  Wellbutrin Xl 300 Mg  Tb24 (Bupropion Hcl) .Marland Kitchen.. 1 Once Daily 4)  Pravachol 40 Mg Tabs (Pravastatin Sodium) .Marland Kitchen.. 1 By Mouth At Bedtime 5)  Aspirin 81 Mg Tbec (Aspirin) .... Take 1 Tab Once Daily 6)  Vitamin D 2000 Unit Tabs (Cholecalciferol) .... Take 1 Tab Once Daily 7)  Tri-Luma 0.01-4-0.05 % Crea (Fluocin-Hydroquinone-Tretinoin) .... Apply Two Times A Day For 8 Weeks 8)  Calcium 600 1500 Mg Tabs (Calcium Carbonate) .Marland Kitchen.. 1 By Mouth Two Times A Day 9)  Vitamin D 400 Unit Tabs (Cholecalciferol) .Marland Kitchen.. 1 By Mouth Two Times A Day 10)  Aspirin 81 Mg Tbec (Aspirin) .Marland Kitchen.. 1 By Mouth Once Daily 11)  Flexeril 10 Mg Tabs (Cyclobenzaprine Hcl) .Marland Kitchen.. 1 By Mouth Three Times A Day As Needed 12)  Mobic 15 Mg Tabs (Meloxicam) .... 1/2 - 1 By Mouth Once Daily 13)  Cheratussin Ac 100-10 Mg/68ml Syrp (Guaifenesin-Codeine) .Marland Kitchen.. 1 -2 Tsp By Mouth At Bedtime As Needed  Allergies (verified): No Known Drug  Allergies  Past History:  Past medical, surgical, family and social histories (including risk factors) reviewed for relevance to current acute and chronic problems.  Past Medical History: Reviewed history from 08/07/2007 and no changes required. DVT, hx of Hyperlipidemia Pulmonary embolism, hx  hyperglycemia Current Problems:  PREVENTIVE HEALTH CARE (ICD-V70.0) HX, URINARY INFECTION (ICD-V13.02) PREGNANCY, ECTOPIC NEC W/INTRAUTERINE PRG (ICD-633.81) PULMONARY EMBOLISM, HX OF (ICD-V12.51) HYPERLIPIDEMIA (ICD-272.4) DVT, HX OF (ICD-V12.51) FASTING HYPERGLYCEMIA (ICD-790.6) FREQUENCY, URINARY (ICD-788.41)  Past Surgical History: Reviewed history from 08/07/2007 and no changes required. ectopic pregnancy foot surgery 1988 laproscopic surgery 1975 cortisone shot R shoulder---DR hiltz--- [facility]  Family History: Reviewed history from 01/10/2007 and no changes required. none  Social History: Reviewed history from 08/07/2007 and no changes required. Occupation: [employer]---  [employer] Divorced Alcohol use-yes Drug use-yes Drug use-no Regular exercise-yes Current Smoker  Review of Systems      See HPI  Physical Exam  General:  Well-developed,well-nourished,in no acute distress; alert,appropriate and cooperative throughout examination Neck:  No deformities, masses, or tenderness noted. Msk:  normal ROM, no joint tenderness, no joint swelling, and no joint warmth.   Pulses:  R radial normal and L radial normal.   Extremities:  No clubbing, cyanosis, edema,  or deformity noted with normal full range of motion of all joints.   Neurologic:  strength normal in all extremities.   Psych:  Oriented X3 and normally interactive.     Impression & Recommendations:  Problem # 1:  CERVICAL STRAIN, WITH RADICULOPATHY (ICD-723.4) con't flexeril and pain med Orders: Radiology Referral (Radiology)  Complete Medication List: 1)  Maxzide-25 37.5-25  Mg Tabs (Triamterene-hctz) .... 1/2 by mouth once daily 2)  Celexa 40 Mg Tabs (Citalopram hydrobromide) .Marland Kitchen.. 1 once daily 3)  Wellbutrin Xl 300 Mg Tb24 (Bupropion hcl) .Marland Kitchen.. 1 once daily 4)  Pravachol 40 Mg Tabs (Pravastatin sodium) .Marland Kitchen.. 1 by mouth at bedtime 5)  Aspirin 81 Mg Tbec (Aspirin) .... Take 1 tab once daily 6)  Vitamin D 2000 Unit Tabs (Cholecalciferol) .... Take 1 tab once daily 7)  Tri-luma 0.01-4-0.05 % Crea (Fluocin-hydroquinone-tretinoin) .... Apply two times a day for 8 weeks 8)  Calcium 600 1500 Mg Tabs (Calcium carbonate) .Marland Kitchen.. 1 by mouth two times a day 9)  Vitamin D 400 Unit Tabs (Cholecalciferol) .Marland Kitchen.. 1 by mouth two times a day 10)  Aspirin 81 Mg Tbec (Aspirin) .Marland Kitchen.. 1 by mouth once daily 11)  Flexeril 10 Mg Tabs (Cyclobenzaprine hcl) .Marland Kitchen.. 1 by mouth three times a day as needed 12)  Mobic 15 Mg Tabs (Meloxicam) .... 1/2 - 1 by mouth once daily 13)  Cheratussin Ac 100-10 Mg/8ml Syrp (Guaifenesin-codeine) .Marland Kitchen.. 1 -2 tsp by mouth at bedtime as needed

## 2010-03-23 NOTE — Letter (Signed)
Summary: [facility]   Imported By: Lanelle Bal 04/05/2009 14:08:46  _____________________________________________________________________  External Attachment:    Type:   Image     Comment:   External Document

## 2010-03-23 NOTE — Progress Notes (Signed)
Summary: rx for compression stockings faxed to [facility]  Medications Added JOBST KNEE HIGH COMPRESSION SM  MISC (ELASTIC BANDAGES & SUPPORTS) wear daily during waking hours       Phone Note Call from Patient Call back at Home Phone [phone]   Caller: Patient Reason for Call: Talk to Nurse Summary of Call: pt needs to get a script to take to a medical supply store for stockings. [facility] does not carry them. please call pt and let her know when she can come and pick it up. Initial call taken by: Edman Circle,  June 09, 2009 11:59 AM  Follow-up for Phone Call        1pm line busy Sander Nephew, RN  3:41pm  lm to c/b  Sander Nephew, RN    New/Updated Medications: JOBST KNEE HIGH COMPRESSION SM  MISC (ELASTIC BANDAGES & SUPPORTS) wear daily during waking hours Prescriptions: JOBST KNEE HIGH COMPRESSION SM  MISC (ELASTIC BANDAGES & SUPPORTS) wear daily during waking hours  #1 x 0   Entered by:   Charolotte Capuchin, RN   Authorized by:   Rollene Rotunda, MD, [facility]   Signed by:   Charolotte Capuchin, RN on 06/09/2009   Method used:   Printed then faxed to ...       [facility]       [address]       [place], [place]  [address]       Ph: [phone]       Fax: [phone]   RxID:   [id number]   Appended Document: talk to nurse  lm to cb  pfh,rn faxed to [facility] supply   [phone]

## 2010-03-23 NOTE — Assessment & Plan Note (Signed)
Summary: rash on abdomen ? scabies//fd   Vital Signs:  Patient profile:   70 year old female Weight:      162 pounds Temp:     98.2 degrees F oral Pulse rate:   94 / minute Resp:     14 per minute BP sitting:   130 / 78  (left arm) Cuff size:   large  Vitals Entered By: Shonna Chock (April 20, 2009 2:27 PM) CC: Rash on abdomen-? scabies. Patient's grandchild had scabies Comments REVIEWED MED LIST, PATIENT AGREED DOSE AND INSTRUCTION CORRECT    Primary Care Provider:  Laury Axon  CC:  Rash on abdomen-? scabies. Patient's grandchild had scabies.  History of Present Illness:  Rash      This is a 70 year old woman who presents with Rash < 24 hrs, noted while trying on clothes @ store.  The patient  denies macules, papules, nodules, hives, welts, pustules, blisters, ulcers, itching, scaling, weeping, and oozing.  The rash is located on the abdomen and left forearm.  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  The patient denies the following symptoms: fever, headache, facial swelling, tongue swelling, burning, difficulty breathing, abdominal pain, nausea, vomiting, diarrhea, dizziness, sore throat, dysuria, eye symptoms, and vaginal discharge.  The patient reports a history of recent exposure to scabies  infection in great grandson, new clothing, and new topical exposure( to Permethrin 5% last week).  The patient denies history of recent tick bite, recent tick exposure, other insect bite, recent antibiotic use, new medication, recent travel, pet/animal contact, thyroid disease, chronic liver disease, and chronic edema.  She was on generic Mobic until last week ; ASA 81 mg D/Ced pre EPI.  Allergies (verified): No Known Drug Allergies  Review of Systems General:  Denies chills, sweats, and weight loss. ENT:  Denies nosebleeds. Resp:  Denies coughing up blood. GI:  Denies bloody stools and dark tarry stools. GU:  Denies hematuria. Psych:   Complains of anxiety; anxiety about scabies recurrence with impending birth of great grandchild. Heme:  Complains of abnormal bruising; denies bleeding.  Physical Exam  General:  well-nourished,in no acute distress; alert,appropriate and cooperative throughout examination Eyes:  No corneal or conjunctival inflammation noted. Perrla.  Nose:  External nasal examination shows no deformity or inflammation. Nasal mucosa are pink and moist without lesions or exudates. Mouth:  Oral mucosa and oropharynx without lesions or exudates.  Teeth in good repair. Upper & lower partials Neck:  ? faint ecchymoses inf posterior neck Lungs:  Normal respiratory effort, chest expands symmetrically. Lungs are  essentially clear to auscultation, no crackles or wheezes.Slight rattly cough Heart:  Normal rate and regular rhythm. S1 and S2 normal without gallop, murmur, click, rub. S4 Abdomen:  Bowel sounds positive,abdomen soft and non-tender without masses, organomegaly or hernias noted. Skin:  3 bland slightly depressed 5X5 -7X53mm ecchymotic lesions in semi circle over mid abdomen; single lesion L dorsal forearm Cervical Nodes:  No lymphadenopathy noted Axillary Nodes:  No palpable lymphadenopathy Psych:  slightly anxious.     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 1/2 by mouth once daily 2)  Celexa 40 Mg Tabs (Citalopram hydrobromide) .Marland Kitchen.. 1 once daily 3)  Wellbutrin Xl  300 Mg Tb24 (Bupropion hcl) .Marland Kitchen.. 1 once daily 4)  Pravachol 40 Mg Tabs (Pravastatin sodium) .Marland Kitchen.. 1 by mouth at bedtime 5)  Aspirin 81 Mg Tbec (Aspirin) .... Take 1 tab once daily 6)  Vitamin D 2000  Unit Tabs (Cholecalciferol) .... Take 1 tab once daily 7)  Tri-luma 0.01-4-0.05 % Crea (Fluocin-hydroquinone-tretinoin) .... Apply two times a day for 8 weeks 8)  Calcium 600 1500 Mg Tabs (Calcium carbonate) .Marland Kitchen.. 1 by mouth two times a day 9)  Vitamin D 400 Unit Tabs (Cholecalciferol) .Marland Kitchen.. 1 by mouth two times a day 10)  Aspirin 81 Mg Tbec (Aspirin) .Marland Kitchen.. 1 by mouth once daily 11)  Flexeril 10 Mg Tabs (Cyclobenzaprine hcl) .Marland Kitchen.. 1 by mouth three times a day as needed 12)  Mobic 15 Mg Tabs (Meloxicam) .... 1/2 - 1 by mouth once daily 13)  Lindane 1 % Lotn (Lindane) .... Apply chin to toes at night after washing all linens,clothes. leave on for 8-12 hrs, shower, wash all linens, clothes next day as well. 14)  Permethrin 5 % Crea (Permethrin) .... Apply chin to toes at night after washing all linens,clothes. leave on for 8-12 hrs, shower, wash all linens, clothes next day as well.  Patient Instructions: 1)  Do not repeat Premethrin  for 2 weeks from initial treatment& then only if nits seen  Appended Document: Orders Update    Clinical Lists Changes  Orders: Added new Test order of T- * Misc. Laboratory test [phone]) - Signed

## 2010-03-23 NOTE — Progress Notes (Signed)
Summary: Exposed to Scabies  Phone Note Call from Patient   Summary of Call: Pt states that her grandson was diagnoised today with Scabies. She has been around him. The pediatrician told them to call there PCP and get a medication for this. Please advise. Army Fossa CMA  April 13, 2009 3:20 PM   Follow-up for Phone Call        lindane 1% crm  apply chin to toes at night--- ( after washing all linens, clothes)---leave on 8-12 hours---shower---wash all linens, clothes next day as well. Follow-up by: Loreen Freud DO,  April 13, 2009 3:51 PM  Additional Follow-up for Phone Call Additional follow up Details #1::        Pt is aware. Army Fossa CMA  April 13, 2009 4:01 PM     New/Updated Medications: LINDANE 1 % LOTN (LINDANE) Apply chin to toes at night after washing all linens,clothes. Leave on for 8-12 hrs, shower, wash all linens, clothes next day as well. Prescriptions: LINDANE 1 % LOTN (LINDANE) Apply chin to toes at night after washing all linens,clothes. Leave on for 8-12 hrs, shower, wash all linens, clothes next day as well.  #1 bottle x 0   Entered by:   Army Fossa CMA   Authorized by:   Loreen Freud DO   Signed by:   Army Fossa CMA on 04/13/2009   Method used:   Electronically to        [facility] (retail)       [address]       [place], [place]  [address]       Ph: [phone]       Fax: [phone]   RxID:   [id number]

## 2010-03-23 NOTE — Progress Notes (Signed)
----   Converted from flag ---- ---- 05/02/2009 3:26 PM, Missy Al-Rammal, RVT, RDCS wrote: Negative for right lower extremity DVT. ------------------------------

## 2010-03-23 NOTE — Medication Information (Signed)
Summary: Elastic Bandages  Elastic Bandages   Imported By: Marylou Mccoy 08/10/2009 18:29:31  _____________________________________________________________________  External Attachment:    Type:   Image     Comment:   External Document

## 2010-03-23 NOTE — Letter (Signed)
Summary: Primary Care Consult Scheduled Letter  [facility]  [address] [place], [place] [address]   Phone: [phone]  Fax: [phone]      05/16/2009 MRN: [id number]  HOORAIN KOZAKIEWICZ [address] [place], [place]  [address]    Dear Ms. Bingman,    We have scheduled an appointment for you.  At the recommendation of Dr. Loreen Freud, we have scheduled you a consult with Dr. Rollene Rotunda of [facility] on 06-08-2009 at 3:30pm.  Their address is [address] floor, [place] [address]. The office phone number is [phone].  If this appointment day and time is not convenient for you, please feel free to call the office of the doctor you are being referred to at the number listed above and reschedule the appointment.    It is important for you to keep your scheduled appointments. We are here to make sure you are given good patient care.   Thank you,    Renee, Patient Care Coordinator [facility]

## 2010-03-23 NOTE — Letter (Signed)
Summary: Consuela Mimes MD  Consuela Mimes MD   Imported By: Lanelle Bal 12/20/2009 12:25:29  _____________________________________________________________________  External Attachment:    Type:   Image     Comment:   External Document

## 2010-03-23 NOTE — Progress Notes (Signed)
Summary: t calling back regarding her medication Klor-con   Phone Note Call from Patient Call back at Home Phone [phone]   Caller: Patient Summary of Call: Pt calling back regarding her medication Klor-con Initial call taken by: Judie Grieve,  Jul 04, 2009 1:02 PM  Follow-up for Phone Call        Spoke with pt., Per Dr. Jenene Slicker note, pt is supposed to take klor con for 2 days only. Pt did understand to stop taking Klorcon. Marrion Coy, CNA  Jul 04, 2009 1:25 PM  Follow-up by: Marrion Coy, CNA,  Jul 04, 2009 1:25 PM

## 2010-03-23 NOTE — Progress Notes (Signed)
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 Army Fossa CMA  April 06, 2009 8:34 AM Results faxed to Dr.Hilts. Army Fossa CMA  April 06, 2009 8:34 AM

## 2010-03-23 NOTE — Letter (Signed)
Summary: [facility]  [facility]   Imported By: Lanelle Bal 09/16/2009 11:55:54  _____________________________________________________________________  External Attachment:    Type:   Image     Comment:   External Document

## 2010-03-23 NOTE — Miscellaneous (Signed)
Summary: Orders Update  Clinical Lists Changes  Orders: Added new Test order of Venous Duplex Lower Extremity (Venous Duplex Lower) - Signed 

## 2010-03-23 NOTE — Progress Notes (Signed)
----   Converted from flag ---- ---- 05/02/2009 3:28 PM, Magdalen Spatz [facility] wrote:   ---- 05/02/2009 3:26 PM, Missy Al-Rammal, RVT, RDCS wrote: Negative for right lower extremity DVT. ------------------------------  Pt is aware.

## 2010-03-23 NOTE — Assessment & Plan Note (Signed)
Summary: right leg sweling//lch   Vital Signs:  Patient profile:   70 year old female Height:      63.74 inches Weight:      168 pounds BMI:     29.18 Temp:     97.8 degrees F oral Pulse rate:   88 / minute Pulse rhythm:   regular BP sitting:   130 / 84  (left arm) Cuff size:   large  Vitals Entered By: Army Fossa CMA (May 02, 2009 1:41 PM) CC: Pt c/o right leg swelling every day for past week, near her ankle. No redness or warm to touch.   History of Present Illness: Pt here c/o RLE swelling--no calf pain but pt states last time when she had dvt it presented same why.  No CP,  no Sob.    Current Medications (verified): 1)  Furosemide 20 Mg Tabs (Furosemide) .Marland Kitchen.. 1 By Mouth Qam 2)  Tri-Luma 0.01-4-0.05 % Crea (Fluocin-Hydroquinone-Tretinoin) .... Apply Two Times A Day For 8 Weeks 3)  Calcium 600 1500 Mg Tabs (Calcium Carbonate) .Marland Kitchen.. 1 By Mouth Two Times A Day 4)  Vitamin D 400 Unit Tabs (Cholecalciferol) .Marland Kitchen.. 1 By Mouth Two Times A Day 5)  Aspirin 81 Mg Tbec (Aspirin) .Marland Kitchen.. 1 By Mouth Once Daily 6)  Permethrin 5 % Crea (Permethrin) .... Apply Chin To Toes At Night After Washing All Linens,clothes. Leave On For 8-12 Hrs, Shower, Wash All Linens, Clothes Next Day As Well.  Allergies (verified): No Known Drug Allergies  Past History:  Past medical, surgical, family and social histories (including risk factors) reviewed for relevance to current acute and chronic problems.  Past Medical History: Reviewed history from 08/07/2007 and no changes required. DVT, hx of Hyperlipidemia Pulmonary embolism, hx  hyperglycemia Current Problems:  PREVENTIVE HEALTH CARE (ICD-V70.0) HX, URINARY INFECTION (ICD-V13.02) PREGNANCY, ECTOPIC NEC W/INTRAUTERINE PRG (ICD-633.81) PULMONARY EMBOLISM, HX OF (ICD-V12.51) HYPERLIPIDEMIA (ICD-272.4) DVT, HX OF (ICD-V12.51) FASTING HYPERGLYCEMIA (ICD-790.6) FREQUENCY, URINARY (ICD-788.41)  Past Surgical History: Reviewed history from  08/07/2007 and no changes required. ectopic pregnancy foot surgery 1988 laproscopic surgery 1975 cortisone shot R shoulder---DR hiltz--- [facility]  Family History: Reviewed history from 01/10/2007 and no changes required. none  Social History: Reviewed history from 08/07/2007 and no changes required. Occupation: [employer]---  [employer] Divorced Alcohol use-yes Drug use-yes Drug use-no Regular exercise-yes Current Smoker  Review of Systems      See HPI  Physical Exam  General:  Well-developed,well-nourished,in no acute distress; alert,appropriate and cooperative throughout examination Lungs:  Normal respiratory effort, chest expands symmetrically. Lungs are clear to auscultation, no crackles or wheezes. Heart:  normal rate and no murmur.   Msk:  normal ROM and no joint tenderness.   Extremities:  trace left pedal edema and 1+ right pedal edema.   Neurologic:  alert & oriented X3 and gait normal.   Skin:  Intact without suspicious lesions or rashes Psych:  Cognition and judgment appear intact. Alert and cooperative with normal attention span and concentration. No apparent delusions, illusions, hallucinations   Impression & Recommendations:  Problem # 1:  LEG EDEMA, RIGHT (ICD-782.3)  Her updated medication list for this problem includes:    Furosemide 20 Mg Tabs (Furosemide) .Marland Kitchen... 1 by mouth qam  Orders: Venipuncture ([address]) TLB-BMP (Basic Metabolic Panel-BMET) (80048-METABOL) Doppler Referral (Doppler)  Discussed elevation of the legs, use of compression stockings, sodium restiction, and medication use.   Complete Medication List: 1)  Furosemide 20 Mg Tabs (Furosemide) .Marland KitchenMarland KitchenMarland Kitchen 1  by mouth qam 2)  Tri-luma 0.01-4-0.05 % Crea (Fluocin-hydroquinone-tretinoin) .... Apply two times a day for 8 weeks 3)  Calcium 600 1500 Mg Tabs (Calcium carbonate) .Marland Kitchen.. 1 by mouth two times a day 4)  Vitamin D 400 Unit Tabs (Cholecalciferol) .Marland Kitchen.. 1 by mouth  two times a day 5)  Aspirin 81 Mg Tbec (Aspirin) .Marland Kitchen.. 1 by mouth once daily 6)  Permethrin 5 % Crea (Permethrin) .... Apply chin to toes at night after washing all linens,clothes. leave on for 8-12 hrs, shower, wash all linens, clothes next day as well. Prescriptions: FUROSEMIDE 20 MG TABS (FUROSEMIDE) 1 by mouth qam  #30 x 2   Entered and Authorized by:   Loreen Freud DO   Signed by:   Loreen Freud DO on 05/02/2009   Method used:   Electronically to        [facility] (retail)       [address]       [place], [place]  [address]       Ph: [phone]       Fax: [phone]   RxID:   [id number]

## 2010-03-23 NOTE — Letter (Signed)
Summary: [facility]: Immunoassay Fecal Occult Blood (iFOB) Order Form  [facility]  [address] [place], [place] [address]   Phone: [phone]  Fax: [phone]      [facility]: Immunoassay Fecal Occult Blood (iFOB) Order Form   November 22, 2009 MRN: [id number]   [place] 28-Nov-1940   Physicican Name:________Lowne______________  Diagnosis Code:_________v76.51_______________      [name] CMA

## 2010-03-23 NOTE — Assessment & Plan Note (Signed)
Summary: STOOL CHANGE FROM NORMAL, NOW LT GREEN; ALSO COUGH///SPH   Vital Signs:  Patient profile:   70 year old female Weight:      173.0 pounds O2 Sat:      100 % on Room air Pulse rate:   83 / minute Pulse rhythm:   regular BP sitting:   130 / 72  (right arm) Cuff size:   large  Vitals Entered By: Almeta Monas CMA Duncan Dull) (November 14, 2009 3:32 PM)  O2 Flow:  Room air CC: c/o changes in stool and cough, Cough   History of Present Illness:  Cough      This is a 70 year old woman who presents with Cough.  The symptoms began 3 months ago.  The patient reports productive cough, shortness of breath, and wheezing, but denies non-productive cough, pleuritic chest pain, exertional dyspnea, fever, hemoptysis, and malaise.  The patient denies the following symptoms: cold/URI symptoms, sore throat, nasal congestion, chronic rhinitis, weight loss, acid reflux symptoms, and peripheral edema.  The cough is worse with activity, lying down, and smoking.    Pt also c/o changing stool color from dark green to light green.  Pt with no abd pain or cramping andstool is normal consistency.     Preventive Screening-Counseling & Management  Alcohol-Tobacco     Smoking Status: current     Packs/Day: 1.0  Current Medications (verified): 1)  Furosemide 20 Mg Tabs (Furosemide) .Marland Kitchen.. 1 By Mouth Qam 2)  Tri-Luma 0.01-4-0.05 % Crea (Fluocin-Hydroquinone-Tretinoin) .... Apply Two Times A Day For 8 Weeks 3)  Calcium 600 1500 Mg Tabs (Calcium Carbonate) .Marland Kitchen.. 1 By Mouth Two Times A Day 4)  Vitamin D 400 Unit Tabs (Cholecalciferol) .Marland Kitchen.. 1 By Mouth Two Times A Day 5)  Aspirin 81 Mg Tbec (Aspirin) .Marland Kitchen.. 1 By Mouth Once Daily 6)  Potassium Chloride Cr 10 Meq Cr-Tabs (Potassium Chloride) .... One Daily As Directed 7)  Jobst Knee High Compression Sm  Misc (Elastic Bandages & Supports) .... Wear Daily During Waking Hours 8)  Zithromax Z-Pak 250 Mg Tabs (Azithromycin) .... As Directed 9)  Mucinex Dm Maximum  Strength 60-1200 Mg Xr12h-Tab (Dextromethorphan-Guaifenesin) 10)  Symbicort 80-4.5 Mcg/act Aero (Budesonide-Formoterol Fumarate) .... 2 Puffs Two Times A Day  Allergies (verified): No Known Drug Allergies  Past History:  Past medical, surgical, family and social histories (including risk factors) reviewed for relevance to current acute and chronic problems.  Past Medical History: Reviewed history from 08/07/2007 and no changes required. 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 Ectopic pregnancy Foot surgery 1988 Laproscopic surgery 1975 Cortisone shot R shoulder---Dr Jorge Mandril--- Lysle Rubens  Family History: Reviewed history from 06/08/2009 and no changes required. The patient's father died at age 60 of unknown causes. Her mother died at age 53 with heart failure.  Social History: Reviewed history from 06/08/2009 and no changes required. Occupation: [employer]---  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  Otoscopic examination reveals clear canals, tympanic membranes are intact bilaterally without bulging, retraction, inflammation or discharge. Hearing is grossly normal bilaterally. Nose:  External nasal  examination shows no deformity or inflammation. Nasal mucosa are pink and moist without lesions or exudates. Mouth:  Oral mucosa and oropharynx without lesions or exudates.  Teeth in good repair. Neck:  No deformities, masses, or tenderness noted. Lungs:  R wheezes and L wheezes.   Heart:  Normal rate and regular rhythm. S1 and S2 normal without gallop, murmur, click, rub or other extra sounds. Extremities:  No clubbing, cyanosis, edema, or deformity noted with normal full range of motion of all joints.   Cervical Nodes:  No lymphadenopathy noted Psych:  Cognition and judgment appear intact. Alert and cooperative with normal attention span and concentration. No apparent delusions, illusions, hallucinations   Impression & Recommendations:  Problem # 1:  BRONCHITIS- ACUTE (ICD-466.0)  Her updated medication list for this problem includes:    Zithromax Z-pak 250 Mg Tabs (Azithromycin) .Marland Kitchen... As directed    Mucinex Dm Maximum Strength 60-1200 Mg Xr12h-tab (Dextromethorphan-guaifenesin)    Symbicort 80-4.5 Mcg/act Aero (Budesonide-formoterol fumarate) .Marland Kitchen... 2 puffs two times a day  Orders: T-2 View CXR (71020TC)  Take antibiotics and other medications as directed. Encouraged to push clear liquids, get enough rest, and take acetaminophen as needed. To be seen in 5-7 days if no improvement, sooner if worse.  Complete Medication List: 1)  Furosemide 20 Mg Tabs (Furosemide) .Marland Kitchen.. 1 by mouth qam 2)  Tri-luma 0.01-4-0.05 % Crea (Fluocin-hydroquinone-tretinoin) .... Apply two times a day for 8 weeks 3)  Calcium 600 1500 Mg Tabs (Calcium carbonate) .Marland Kitchen.. 1 by mouth two times a day 4)  Vitamin D 400 Unit Tabs (Cholecalciferol) .Marland Kitchen.. 1 by mouth two times a day 5)  Aspirin 81 Mg Tbec (Aspirin) .Marland Kitchen.. 1 by mouth once daily 6)  Potassium Chloride Cr 10 Meq Cr-tabs (Potassium chloride) .... One daily as directed 7)  Jobst Knee High Compression Sm Misc (Elastic bandages & supports) .... Wear daily during waking  hours 8)  Zithromax Z-pak 250 Mg Tabs (Azithromycin) .... As directed 9)  Mucinex Dm Maximum Strength 60-1200 Mg Xr12h-tab (Dextromethorphan-guaifenesin) 10)  Symbicort 80-4.5 Mcg/act Aero (Budesonide-formoterol fumarate) .... 2 puffs two times a day Prescriptions: ZITHROMAX Z-PAK 250 MG TABS (AZITHROMYCIN) as directed  #1 x 0   Entered and Authorized by:   Loreen Freud DO   Signed by:   Loreen Freud DO on 11/14/2009   Method used:   Electronically to        [facility] [phone]* (retail)       [address]       [place], [place]  [address]       Ph: [phone]       Fax: [phone]   RxID:   [id number]

## 2010-03-23 NOTE — Consult Note (Signed)
Summary: [facility]  [facility]   Imported By: Lanelle Bal 08/30/2009 12:02:56  _____________________________________________________________________  External Attachment:    Type:   Image     Comment:   External Document

## 2010-03-23 NOTE — Progress Notes (Signed)
Summary: Meds to expensive  Phone Note Call from Patient   Summary of Call: Pt states the rx we sent in for her Scabies was $60, and she cannot afford this. Pt is requesting Premethrin, she states that everyone else was put on this and it is only $10. Please advise. Army Fossa CMA  April 15, 2009 11:54 AM   Follow-up for Phone Call        fine to call that in--but its otc---used thesame way Follow-up by: Loreen Freud DO,  April 15, 2009 11:58 AM  Additional Follow-up for Phone Call Additional follow up Details #1::        Pt is aware. Army Fossa CMA  April 15, 2009 12:47 PM     New/Updated Medications: PERMETHRIN 5 % CREA (PERMETHRIN) Apply chin to toes at night after washing all linens,clothes. Leave on for 8-12 hrs, shower, wash all linens, clothes next day as well. Prescriptions: PERMETHRIN 5 % CREA (PERMETHRIN) Apply chin to toes at night after washing all linens,clothes. Leave on for 8-12 hrs, shower, wash all linens, clothes next day as well.  #1 bottle x 0   Entered by:   Army Fossa CMA   Authorized by:   Loreen Freud DO   Signed by:   Army Fossa CMA on 04/15/2009   Method used:   Electronically to        [facility] [phone]* (retail)       [address]       [place], [place]  [address]       Ph: [phone]       Fax: [phone]   RxID:   [id number]

## 2010-03-23 NOTE — Assessment & Plan Note (Signed)
Summary: np6/ dystolic dysfuntion / pt has medicare, geha/ gd  Medications Added POTASSIUM CHLORIDE CR 10 MEQ CR-TABS (POTASSIUM CHLORIDE) one daily as directed      Allergies Added: NKDA  Visit Type:  Initial Consult Primary Provider:  Loreen Freud DO  CC:  Edema.  History of Present Illness: The patient presents for evaluation of right greater than left lower extremity edema. She had a history of deep venous thrombosis 2006. I reviewed the results of this hospitalization and this was described as extensive. She was treated with anticoagulation. Since that time she has had no further cardiovascular problems. I do note that she had cardiac catheterization in 2006 with normal coronaries. Now she presents with recurrent right and left lower extremity swelling. This does go down at night in her legs or up. She did have lower extremity venous Doppler which demonstrated right DVT or venous incompetence. She also had an echocardiogram which suggested some mild diastolic dysfunction but no elevated pulmonary pressures, systolic dysfunction or valvular abnormalities. She does get some shortness of breath with climbing stairs but this has been mildly progressive. She has had a 13 pound weight gain since she stopped working. She says she is eating more and less active. She denies chest discomfort, neck or arm discomfort. She has no palpitations, presyncope or syncope.  Current Medications (verified): 1)  Furosemide 20 Mg Tabs (Furosemide) .Marland Kitchen.. 1 By Mouth Qam 2)  Tri-Luma 0.01-4-0.05 % Crea (Fluocin-Hydroquinone-Tretinoin) .... Apply Two Times A Day For 8 Weeks 3)  Calcium 600 1500 Mg Tabs (Calcium Carbonate) .Marland Kitchen.. 1 By Mouth Two Times A Day 4)  Vitamin D 400 Unit Tabs (Cholecalciferol) .Marland Kitchen.. 1 By Mouth Two Times A Day 5)  Aspirin 81 Mg Tbec (Aspirin) .Marland Kitchen.. 1 By Mouth Once Daily  Allergies (verified): No Known Drug Allergies  Past History:  Past Medical History: Reviewed history from 08/07/2007 and  no changes required. DVT, hx of Hyperlipidemia Pulmonary embolism, hx  hyperglycemia Current Problems:  PREVENTIVE HEALTH CARE (ICD-V70.0) HX, URINARY INFECTION (ICD-V13.02) PREGNANCY, ECTOPIC NEC W/INTRAUTERINE PRG (ICD-633.81) PULMONARY EMBOLISM, HX OF (ICD-V12.51) HYPERLIPIDEMIA (ICD-272.4) DVT, HX OF (ICD-V12.51) FASTING HYPERGLYCEMIA (ICD-790.6) FREQUENCY, URINARY (ICD-788.41)  Past Surgical History: Ectopic pregnancy Foot surgery 1988 Laproscopic surgery 1975 Cortisone shot R shoulder---Dr Jorge Mandril--- Lysle Rubens  Family History: The patient's father died at age 68 of unknown causes. Her mother died at age 88 with heart failure.  Social History: Occupation: [employer]---  [employer] 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   Vital Signs:  Patient profile:   70 year old female Height:      63.74 inches Weight:      168 pounds BMI:     29.18 Pulse rate:   99 / minute Resp:     16 per minute BP sitting:   98 / 60  (right arm)  Vitals Entered By: Marrion Coy, CNA (June 08, 2009 3:45 PM)  Physical Exam  General:  Well developed, well nourished, in no acute distress. Head:  normocephalic and atraumatic Eyes:  PERRLA/EOM intact; conjunctiva and lids normal. Mouth:  Teeth, gums and palate normal. Oral mucosa normal. Neck:  Neck supple, no JVD. No masses, thyromegaly or abnormal cervical nodes. Chest Wall:  no deformities or breast masses noted Lungs:  Clear bilaterally to auscultation and  percussion. Abdomen:  Bowel sounds positive; abdomen soft and non-tender without masses, organomegaly, or hernias noted. No hepatosplenomegaly. Msk:  Back normal, normal gait. Muscle strength and tone normal. Extremities:  No clubbing or cyanosis. Neurologic:  Alert and oriented x 3. Skin:   Intact without lesions or rashes. Cervical Nodes:  no significant adenopathy Axillary Nodes:  no significant adenopathy Inguinal Nodes:  no significant adenopathy Psych:  Normal affect.   Detailed Cardiovascular Exam  Neck    Carotids: Carotids full and equal bilaterally without bruits.      Neck Veins: Normal, no JVD.    Heart    Inspection: no deformities or lifts noted.      Palpation: normal PMI with no thrills palpable.      Auscultation: regular rate and rhythm, S1, S2 without murmurs, rubs, gallops, or clicks.    Vascular    Abdominal Aorta: no palpable masses, pulsations, or audible bruits.      Femoral Pulses: normal femoral pulses bilaterally.      Pedal Pulses: normal pedal pulses bilaterally.      Radial Pulses: normal radial pulses bilaterally.      Peripheral Circulation: no clubbing, cyanosis, or edema noted with normal capillary refill.     EKG  Procedure date:  06/08/2009  Findings:      sinus rhythm, rate 99, left axis deviation, right atrial enlargement, poor anterior R-wave progression, not changed from previous EKGs.  Impression & Recommendations:  Problem # 1:  LEG EDEMA, RIGHT (ICD-782.3) The patient has significant leg edema. I would've suspected DVT but this has been ruled out with venous Doppler. Also her EKG and past history might suggest elevated pulmonary pressures but the echocardiogram does not suggest this at all in her neck veins are not elevated. Therefore, despite the absence of this are Doppler pending venous incompetence would be most likely. I will give her Lasix 20 mg a day for the next 2 days with 10 mEq of potassium. I will check a basic metabolic profile today. I will send her to see a vein specialist.  I have also prescribed compression stockings.  Orders: Misc. Referral (Misc. 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 She has no severe symptoms. I will check a BNP  level and consider further evaluation certainly based on his symptoms are the results of this blood work. 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 Orders: EKG w/ Interpretation (93000)  Patient Instructions: 1)  Your physician recommends that you schedule a follow-up appointment as needed 2)  Your physician recommends that you have lab work today  BNP BMP  3)  Your physician has recommended you make the following change in your medication: Furosemide 20 mg a day for 2 days,  Potassium Chlo 10 Meq for 2 days 4)  You have been referred to Dr Donia Ast  [id number] Prescriptions: POTASSIUM CHLORIDE CR 10 MEQ CR-TABS (POTASSIUM CHLORIDE) one daily as directed  #30 x 0   Entered by:   Charolotte Capuchin, RN   Authorized by:   Rollene Rotunda, MD, [facility]   Signed by:   Charolotte Capuchin, RN on 06/08/2009   Method used:   Electronically to        [facility] (retail)       [address]  [place], [place]  [address]       Ph: [phone]       Fax: [phone]   RxID:   [id number]

## 2010-03-23 NOTE — Progress Notes (Signed)
Summary:  still itching  Phone Note Call from Patient Call back at [facility] Phone [phone] Call back at Work Phone [phone]   Caller: Patient Summary of Call: PT states that she is still itching real bad and would like for dr lowne to refill PERMETHRIN 5 % CREA. pt uses [facility]. pls advise...........Marland KitchenFelecia Deloach CMA  April 19, 2009 10:18 AM   Follow-up for Phone Call        supposed to wait 14 days before retreatment and only if live mites still seen.  If only itchy but nothing visualized should not retreat---but she needs to wait 2 weeks anyway Follow-up by: Loreen Freud DO,  April 19, 2009 12:08 PM  Additional Follow-up for Phone Call Additional follow up Details #1::        Tried to call pt memory full on VM will try again later.............Marland KitchenFelecia Deloach CMA  April 19, 2009 1:01 PM  tried to call again no answer VM still full..........Marland KitchenFelecia Deloach CMA  April 19, 2009 4:37 PM  left message to call  office..........Marland KitchenFelecia Deloach CMA  April 20, 2009 10:36 AM     Additional Follow-up for Phone Call Additional follow up Details #2::    pt states that when she call to request original rx it was because she was in contact with grandson who had scabies. Now pt states that she has itchy spot all over abdomen area. Pt has already done treatment with cream. Pt notice rash when she was trying on clothes yesterday. Pt would like to know if she can not do cream again what can she do..........Marland KitchenFelecia Deloach CMA  April 20, 2009 11:56 AM   Per dr Laury Axon pt can try benadryl, or zyrtec to see if symptoms resolve if not pt will need to be see to confirmed what rash is coming from..............Marland KitchenFelecia Deloach CMA  April 20, 2009 12:03 PM   pt aware but prefer to come in. pt has OV for today...............Marland KitchenFelecia Deloach CMA  April 20, 2009 12:08 PM

## 2010-03-23 NOTE — Progress Notes (Signed)
Summary: refill  Phone Note Refill Request Message from:  Fax from Pharmacy on January 02, 2010 1:50 PM  Refills Requested: Medication #1:  FUROSEMIDE 20 MG TABS 1 by mouth qam [facility] - fax [phone]  Initial call taken by: Okey Regal [place],  January 02, 2010 1:50 PM    Prescriptions: FUROSEMIDE 20 MG TABS (FUROSEMIDE) 1 by mouth qam  #90 x 0   Entered by:   Almeta Monas CMA (AAMA)   Authorized by:   Loreen Freud DO   Signed by:   Almeta Monas CMA (AAMA) on 01/03/2010   Method used:   Electronically to        [facility] [phone]* (retail)       [address]       [place], [place]  [address]       Ph: [phone]       Fax: [phone]   RxID:   [id number]

## 2010-03-23 NOTE — Progress Notes (Signed)
Summary: returning call   Phone Note Call from Patient Call back at Home Phone [phone]   Caller: Patient Reason for Call: Talk to Nurse Summary of Call: returning call Initial call taken by: Migdalia Dk,  Jul 01, 2009 8:13 AM  Follow-up for Phone Call        07/01/09--9am--dr Aquarius Latouche--ms Eisen is calling stating she was under impression she was to take KCL 10 mg once daily--according to your note she was only to take furosemide and KCL for two days for edema lower extremeties--would you please call me or pt and give [name] oders you want--thanks nt     Appended Document: returning call Lasix an KCL was to be for two days only.

## 2010-03-23 NOTE — Consult Note (Signed)
Summary: [facility]  [facility]   Imported By: Lanelle Bal 08/09/2009 10:27:28  _____________________________________________________________________  External Attachment:    Type:   Image     Comment:   External Document

## 2010-03-23 NOTE — Progress Notes (Signed)
Summary: Referral  Phone Note Call from Patient   Caller: Patient Summary of Call: Pt called and stated that she was under the impression that she was going to be referred to cardiology. Please advise. I do not see a referall in the computer. Army Fossa CMA  May 04, 2009 11:04 AM   Follow-up for Phone Call        We were going to get ECHO----  not cardio yet Follow-up by: Loreen Freud DO,  May 04, 2009 11:10 AM  Additional Follow-up for Phone Call Additional follow up Details #1::        Pt is aware. Army Fossa CMA  May 04, 2009 12:58 PM

## 2010-03-23 NOTE — Miscellaneous (Signed)
Summary: Outpatient Coinsurance Notice  Outpatient Coinsurance Notice   Imported By: Marylou Mccoy 05/18/2009 16:30:09  _____________________________________________________________________  External Attachment:    Type:   Image     Comment:   External Document

## 2010-07-07 NOTE — Op Note (Signed)
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   PROCEDURE:  Excision of ganglion, right thumb with release of A1 pulley  right thumb.   SURGEON:  Lowell Bouton, M.D.   ANESTHESIA:  Half percent Marcaine local with sedation.   OPERATIVE FINDINGS:  The patient had a volar retinacular ganglion that arose  from the A1 pulley of her right thumb. There was some shredding of the  flexor pollicus longus tendon and no nodules in the tendon.   PROCEDURE:  Under half percent Marcaine local anesthesia with a tourniquet  on the right arm. The right hand was prepped and draped in usual fashion and  after explaining the limb. The tourniquet was inflated to 250 mmHg. A V-  shaped incision was made over the volar aspect of the MP joint of the right  thumb. Sharp dissection was carried through the subcutaneous tissues. Blunt  dissection was carried down in the midline to the mass and care was taken to  protect the digital nerves. The ganglion was excised completely. The A1  pulley was then released with the scissors. The wound was irrigated with  saline. The thumb was flexed and no further triggering occurred. The skin  was closed with 4-0 nylon suture. Sterile dressings were applied. The  patient went to the recovery room awake and in stable condition.      Lowell Bouton, M.D.  Electronically Signed     EMM/MEDQ  D:  12/04/2004  T:  12/04/2004  Job:  [id number]   cc:   Lelon Perla, M.D.  [address] [place], [place] [address]

## 2010-07-07 NOTE — Cardiovascular Report (Signed)
NAMEKEELEE, YANKEY NO.:  [id number]   MEDICAL RECORD NO.:  [id number]          PATIENT TYPE:  OIB   LOCATION:  2899                         FACILITY:  [facility]   PHYSICIAN:  Arvilla Meres, M.D. LHCDATE OF BIRTH:  17-Nov-1940   DATE OF PROCEDURE:  12/29/2004  DATE OF DISCHARGE:  12/29/2004                              CARDIAC CATHETERIZATION   PATIENT IDENTIFICATION:  [name] is a very pleasant 70 year old woman  with a history of hypertension, hyperlipidemia and pulmonary embolism back  in March of 2006.  She has been evaluated in our office for persistent  dyspnea.  She has not had any chest discomfort.  As part of her workup, she  got a Cardiolite which showed an area of apical ischemia and thus was  referred for diagnostic heart catheterization.   PROCEDURES PERFORMED:  1.  Selective coronary angiography.  2.  Left heart catheterization.  3.  Left ventriculogram.  4.  AngioSeal femoral closure.   DESCRIPTION OF PROCEDURE:  The risks and benefits of procedure were  explained to [name]; consent was signed and placed on her chart.  Prior to the procedure, the patient held her Coumadin for several days and  she had an INR of 1.2 on the day of procedure.  A 6-French arterial sheath  was placed in the right femoral artery using a modified Seldinger technique.  Standard preformed Judkins catheters including a JL4, JR4 and angled pigtail  were used for the procedure.  All catheter exchanges were made over a wire;  there are no apparent complications.  At the end of the procedure, the  patient had her femoral artery sealed with an Angio-Seal device.  There was  good hemostasis.   HEMODYNAMIC RESULTS:  Central aortic pressure 106/84 with a mean of 82.  LV  103/1 with an LVEDP of 5.  There is no gradient across the aortic valve on  pullback.   Left main:  Was normal.   LAD was a long vessel coursing to the apex.  It gave off a large proximal  diagonal.  This was angiographically normal.   The left circumflex was a moderate-sized system made up primarily of a large  OM-1.  The distal AV groove circumflex was small.  There was no angiographic  CAD.   The right coronary artery was a dominant vessel; it gave off an RV branch, a  moderate-to-large PDA and a small PL, and moderate-to-large second PL.  There is no angiographic CAD.   LEFT VENTRICULOGRAM:  Left ventriculogram done the RAO position showed an EF  of approximately 65% with no wall motion abnormalities or mitral  regurgitation.   ASSESSMENT:  1.  Normal coronary arteries with no evidence of atherosclerotic disease.  2.  Normal left ventricular function with low filling pressures.  3.  Dyspnea and unclear etiology.   PLAN:  1.  We will continue with risk factor modification.  2.  She will be eligible for discharge today.  She will resume her Coumadin.  3.  I would consider possible cardiopulmonary exercise test for further      evaluation  of her dyspnea if this persists.      Arvilla Meres, M.D. [facility]  Electronically Signed     DB/MEDQ  D:  12/29/2004  T:  12/30/2004  Job:  [id number]

## 2010-07-07 NOTE — H&P (Signed)
Sierra Ford, Sierra Ford NO.:  [id number]   MEDICAL RECORD NO.:  [id number]          PATIENT TYPE:  INP   LOCATION:  1829                         FACILITY:  [facility]   PHYSICIAN:  Gordy Savers, M.D. LHCDATE OF BIRTH:  1941-01-04   DATE OF ADMISSION:  04/21/2004  DATE OF DISCHARGE:                                HISTORY & PHYSICAL   CHIEF COMPLAINT:  Right leg swelling.   HISTORY OF PRESENT ILLNESS:  The patient is a 70 year old, white female who  began having chest pain approximately 1 month ago.  The pain at times was  sharp and also described as heaviness.  It was aggravated by bending,  coughing and deep inspiration.  There was no prior history of trauma.  She  denied any shortness of breath or productive cough.  The pain was  intermittent.  She was evaluated in the office on a couple of occasions  where oxygen saturation was 98-99%.  She was in no distress and clinically  observed.  The patient did have an exercise tolerance test performed with  negative results.  A chest x-ray was obtained and was negative.  The patient  has continued to have occasional chest pain, but over the past week has  developed intermittent right leg swelling.  The patient was seen in the  office and placed on diuretic therapy and sodium restricted diet and  initially improved.  On the day of admission, right leg swelling reoccurred  and the patient was set up for a CT scan of the chest as well as lower  extremity venous Doppler evaluation.  The CT scan confirmed bilateral small  pulmonary emboli.  The patient is now admitted for evaluation and treatment.   PAST MEDICAL HISTORY:  1.  Fairly unremarkable as she has enjoyed excellent health.  2.  She is a G3, P1, AB2.  She has a history of some mild depression in the      past and had been on Wellbutrin therapy.  3.  She has been evaluated by orthopedics in the past for a left shoulder      tendonitis in 1995.  4.  She has history  of ongoing tobacco use.   MEDICATIONS:  Femhrt, which was discontinued last month.  She is presently  on no chronic medications.   FAMILY HISTORY:  Fairly noncontributory.  Mother with history of senile  dementia and colonic polyps.  One sister died of alcohol related  complications.   REVIEW OF SYSTEMS:  Otherwise negative.  Did have screening colonoscopy in  2003.  She has annual gynecologic checks with Dr. Jennette Kettle.   PHYSICAL EXAMINATION:  GENERAL:  A healthy-appearing, African-American  female in no acute distress.  VITAL SIGNS:  Blood pressure 124/76, pulse 76, O2 saturations 99-100% on  room air.  HEENT:  Normal fundi.  Ears, nose and throat clear.  NECK:  No neck vein distention or bruits.  CHEST:  Clear with no rubs.  CARDIAC:  Normal regular rhythm without ectopy.  ABDOMEN:  Soft, nontender, no organomegaly.  EXTREMITIES:  +2 edema distal to the right knee.  Peripheral pulses were  full.  There was no localized tenderness.   IMPRESSION:  1.  Bilateral pulmonary emboli.  2.  Right leg deep venous thrombosis.  3.  Recent hormone replacement therapy.  4.  History of mild hypercholesterolemia with a high-density lipoprotein.  5.  Tobacco use.   PLAN:  The patient will be admitted to the hospital.  She will be placed on  IV heparin and started on Coumadin.  Cessation of smoking is encouraged.  Hormone replacement therapy has been discontinued.      PFK/MEDQ  D:  04/21/2004  T:  04/22/2004  Job:  [id number]

## 2010-07-07 NOTE — Discharge Summary (Signed)
NAMEJONATHON, CASTELO NO.:  [id number]   MEDICAL RECORD NO.:  [id number]          PATIENT TYPE:  INP   LOCATION:  3005                         FACILITY:  [facility]   PHYSICIAN:  Gordy Savers, M.D. LHCDATE OF BIRTH:  [birth year]   DATE OF ADMISSION:  04/21/2004  DATE OF DISCHARGE:  04/27/2004                                 DISCHARGE SUMMARY   DISCHARGE DIAGNOSES:  1.  Extensive right lower extremity deep vein thrombosis.  2.  Bilateral small segmental pulmonary emboli.   BRIEF ADMISSION HISTORY:  [name] is a 70 year old African-American  female who presented with a history of intermittent pleuritic chest pain  over the past weekend and increased right lower extremity edema. Outpatient  CT scan revealed bilateral small PE. The patient was admitted for further  evaluation and treatment.   PAST MEDICAL HISTORY:  1.  Tobacco, ongoing.  2.  Hypercholesterolemia.  3.  Menopausal symptoms.  4.  Depression.   HOSPITAL COURSE:  The patient presented with bilateral PE. The patient  requested lower extremity venous Dopplers be performed. This confirmed a  right lower extremity DVT. There is extensive DVT noted from the posterior  tibial vein extending into the anterior tibial, popliteal, femoral veins.  DVT was occlusive from the ankle through the femoral vein distal portion.  There was minimal flow noted in the mid proximal femoral vein. DVT was  nonocclusive and mobile in the common femoral vein. The patient was started  on heparin and Coumadin. The patient had some minimal pleuritic pain  initially, but this has resolved. She still has some persistent right lower  extremity swelling. Overall, the patient is symptomatically improved. She is  now currently therapeutic on Coumadin. We suspect that the etiology of her  DVT was secondary to hormone replacement therapy in combination with tobacco  use. The patient's hormone therapy has been discontinued, and the  patient  has agreed to smoking cessation.   LABORATORY DATA:  Protime 20.5, INR 2.2. Hemoglobin 12.3, platelet count  316.   MEDICATIONS AT DISCHARGE:  Coumadin 6 mg daily.   Followup at the Coumadin clinic Monday March 13 at 1:30 p.m. The patient has  also requested followup with Dr. Kriste Basque. I have talked with Tammy, Dr.  Jodelle Green nurse, and she will discuss with Dr. Kriste Basque. Either way, Tammy will  call the patient with followup.      LC/MEDQ  D:  04/27/2004  T:  04/27/2004  Job:  [id number]   cc:   Lonzo Cloud. Kriste Basque, M.D. [facility]

## 2010-07-07 NOTE — Assessment & Plan Note (Signed)
[facility]                               PULMONARY OFFICE NOTE   CRYSTOL, WALPOLE                     MRN:          [id number]  DATE:11/08/2005                            DOB:          [birth year]    PROBLEM LIST:  1. Dyspnea.  2. Pulmonary embolism in 2006.  3. Insomnia.  4. Hyperlipidemia.   HISTORY:  Sierra Ford has been on Coumadin and is followed in the Coumadin  Clinic since experiencing DVT with pulmonary embolism in March 2006 while on  hormone replacement using birth control pills and smoking.  I saw her with  complaints of dyspnea and concern for possible residual affect of her  pulmonary embolism.  She has been off hormones since the pulmonary embolism  and has stopped smoking.  She has gotten a treadmill machine and is walking  regularly on that.  She feels her conditioning is better and she notices  that she has lost weight.  She states her dyspnea has completely resolved.  She has had no chest pain, palpitations, leg pain or other discomfort.  She  has little cough or wheeze and only scant clear phlegm.   Pulmonary function testing in June 2006 had shown normal spirometry flows  with a small airway response to bronchodilator suggesting a subclinical  asthma pattern.  Lung volumes were normal.  Diffusion was very slightly  reduced at 79%  of predicted.   MEDICATIONS:  1. Coumadin.  2. Triamterene/hydrochlorothiazide 1/2 x 75/50.  3. Aspirin 81 mg.  4. Vytorin 10/20.  5. Wellbutrin XL 300 mg.  6. Citalopram 40 mg.   ALLERGIES:  The patient has no medication allergies.   NOTE:  The patient has had no bleeding.   OBJECTIVE:  VITAL SIGNS:  Weight 151 pounds down from 163 pounds last  November.  BP 118/70, pulse regular 81 and room air saturation 97%.  GENERAL APPEARANCE:  The patient looks quite comfortable.  LUNGS:  The lungs are very clear.  NECK:  The neck veins are not distended.  EXTREMITIES:  There is no  peripheral edema.  HEART:  The heart sounds are regular.  P2 is not increased.  CHEST:  I hear no rubs, rales or cough.  LYMPHATICS: I find no adenopathy.   IMPRESSION:  1. The patient had a pulmonary embolism while on risk factors including      hormone replacement and cigarettes, which have now been discontinued.      There has been no recurrence.  2. 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  Her complaint of dyspnea has resolved.   PLAN:  1. It is appropriate now to discontinue Coumadin.  The patient has been      instructed on avoiding venous stasis.  2. The patient is encouraged to continue exercising and to keep her weight      down.  3. Chest x-ray for a former smoker with a complaint of dyspnea.  4. Schedule return in six months and earlier if needed.  Clinton D. Maple Hudson, MD, [facility], FACP   CDY/MedQ  DD:  11/08/2005  DT:  11/09/2005  Job #:  [id number]   cc:   Loreen Freud, M.D.

## 2010-07-07 NOTE — Assessment & Plan Note (Signed)
[facility]                              CARDIOLOGY OFFICE NOTE   Sierra Ford, Sierra Ford                     MRN:          [id number]  DATE:10/19/2005                            DOB:          03-19-1940    IDENTIFYING INFORMATION/JUSTIFICATION FOR ADMISSION AND CARE:  Sierra Ford  is a 70 year old woman who I have seen in the past for shortness of breath.  She has a history of pulmonary embolism and dyslipidemia. Had a normal  cardiac catheterization back in November 2006. I last saw her in March. When  I saw her last, she was increasing her physical activity with walking. Now  she says she walks on her treadmill every day for about 4 miles. She denies  shortness of breath. Her activity is good. She has lost about 20 pounds. No  longer smoking.   MEDICATIONS:  Coumadin as directed. Triamterine/hydrochlorothiazide 75/50  1/2 tablet daily, calcium with D daily, aspirin 81 mg daily, Vytorin 10/20  daily, Wellbutrin XL 300 daily, and ____________40 daily.   PHYSICAL EXAMINATION:  GENERAL:  The patient is in no distress.  VITAL SIGNS:  Blood pressure 116/78, pulse 69, weight 151 down from 160 on  last visit.  LUNGS:  Clear.  CARDIAC:  Regular rate and rhythm. S1 and S2. No S2, no murmurs.  ABDOMEN:  Benign.  EXTREMITIES:  No edema.   IMPRESSION/PLAN:  Overall, she is doing great. I encouraged her and  congratulated her on her efforts and I think it is paying off. Again, she  had a normal left heart catheterization back last November. She does have a  history of pulmonary embolus and she will be followed up with Sierra Ford.  This occurred while she was on oral contraceptives and smoking. He will need  to evaluate long term. She does have a history of dyslipidemia. She had an  LDL at about 260 at one point. This was back in September of last year. Now  on Vytorin 10/40. Her LDL is 90, HDL is 86. She is back down to 10/20 and I  will be in touch with  her with the results. I think with all of her dietary  changes, I will back down as her lipids tolerate. My goal for her would be  to not have her LDL exceed 130. I have not set a definite followup for her.  She is followed by Sierra Ford and Sierra Ford and I would be happy to work  with them with the lipid values until things reach a steady state. If her  symptoms change, of course I would be happy to see her in the future.                                Pricilla Riffle, MD, [facility]    PVR/MedQ  DD:  10/19/2005  DT:  10/20/2005  Job #:  [id number]   cc:   Sierra Fears D. Maple Hudson, MD, FCCP, FACP  Sierra Gowda, MD

## 2010-07-07 NOTE — Op Note (Signed)
   [name], Sierra Ford                        ACCOUNT NO.:  [id number]   MEDICAL RECORD NO.:  [id number]                   PATIENT TYPE:  AMB   LOCATION:  SDC                                  FACILITY:  [facility]   PHYSICIAN:  Freddy Finner, M.D.                DATE OF BIRTH:  20-May-1940   DATE OF PROCEDURE:  10/08/2002  DATE OF DISCHARGE:                                 OPERATIVE REPORT   PREOPERATIVE DIAGNOSES:  1. Postmenopausal bleeding.  2. 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  3. Endometrial thickening on pelvic ultrasound.   POSTOPERATIVE DIAGNOSES:  1. Postmenopausal bleeding.  2. 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  3. Endometrial thickening on pelvic ultrasound.   OPERATIVE PROCEDURE:  Hysteroscopy D&C.   INTRAOPERATIVE COMPLICATIONS:  None.   ANESTHESIA:  General.   ESTIMATED INTRAOPERATIVE BLOOD LOSS:  Less than 10 mL.   SORBITOL DEFICIT:  40 mL.   Details of the present illness are recorded in the admission note.  Briefly,  the patient is a 70 year old with postmenopausal bleeding on hormone  replacement therapy and with endometrial thickening and cervical stenosis on  pelvic ultrasound.  She is admitted now for hysteroscopy D&C.   She was admitted on the morning of surgery, brought to the operating room,  placed under adequate general anesthesia, placed in the dorsal lithotomy  position using the [facility] stirrup system.  Betadine prep was carried out in  the usual fashion.  Sterile drapes were applied.  The cervix was visualized,  grasped with a single tooth tenaculum.  The uterus sounded to 9 cm.  Using  small Pratts and progressive dilatation the cervix was dilated to 23.  The  12.5-degree [facility] hysteroscope was introduced using 3% sorbitol as a  distending medium.  The cavity was visualized.  No intracavitary abnormality  was noted.  Gentle thorough curettage was carried out and exploration with  Randall stick forceps to retrieve the tissue.  Reinspection  revealed  adequate sampling.  Photographs were made of the intraoperative findings.  The patient was awakened, taken to recovery room in good condition.  She  will be given routine outpatient surgical instructions, for follow-up in the  office in seven to ten days.  She was given Darvocet to be taken for  postoperative pain if something stronger than over-the-counter is required.                                               Freddy Finner, M.D.   WRN/MEDQ  D:  10/08/2002  T:  10/08/2002  Job:  [id number]

## 2010-10-18 ENCOUNTER — Encounter: Payer: Self-pay | Admitting: Internal Medicine

## 2010-10-18 ENCOUNTER — Ambulatory Visit (INDEPENDENT_AMBULATORY_CARE_PROVIDER_SITE_OTHER): Payer: Medicare Other | Admitting: Internal Medicine

## 2010-10-18 VITALS — BP 122/76 | HR 101 | Temp 98.5°F | Wt 167.4 lb

## 2010-10-18 DIAGNOSIS — J209 Acute bronchitis, unspecified: Secondary | ICD-10-CM

## 2010-10-18 MED ORDER — HYDROCODONE-HOMATROPINE 5-1.5 MG/5ML PO SYRP: 5.0000 mL | ORAL_SOLUTION | Freq: Four times a day (QID) | ORAL | Status: AC | PRN

## 2010-10-18 NOTE — Patient Instructions (Signed)
Plain Mucinex for thick secretions ;force NON dairy fluids for next 48 hrs. Use a Neti pot daily as needed for sinus congestion 

## 2010-10-18 NOTE — Progress Notes (Signed)
  Subjective:    Patient ID: Sierra Ford, female    DOB: 02-Apr-1940, 71 y.o.   MRN: [id number]  HPI Cough Onset:8/24 Extrinsic symptoms:itchy eyes, sneezing:no  Infectious symptoms :fever, purulent secretions : yellow sputum Chest symptoms: pain, hemoptysis,dyspnea.Some wheezing: GI symptoms: Dyspepsia, reflux: no Occupational/environmental exposures:sick grandchildren Smoking:quit 2/12 ACE inhibitor:no Treatment/efficacy: none Past medical history/family history pulmonary disease: PMH of bronchitis when smoking    Review of Systems The major and minor symptoms of rhinosinusitis were reviewed. She denies nasal congestion/obstruction; nasal purulence; facial pain; anosmia; fatigue; fever; headache; halitosis; earache or dental pain.    Objective:   Physical Exam General appearance is of good health and nourishment; no acute distress or increased work of breathing is present.  No  lymphadenopathy about the head, neck, or axilla noted.   Eyes: No conjunctival inflammation or lid edema is present.  Ears:  External ear exam shows no significant lesions or deformities.  Otoscopic examination reveals  Wax bilaterally Nose:  External nasal examination shows no deformity or inflammation. Nasal mucosa are pink and moist without lesions or exudates. No septal dislocation or dislocation.No obstruction to airflow.   Oral exam: Dental hygiene is good; lips and gums are healthy appearing.There is no oropharyngeal erythema or exudate noted.  Upper partial    Heart:  Normal rate and regular rhythm. S1 and S2 normal without gallop, murmur, click, rub or other extra sounds.   Lungs:Chest clear to auscultation; no wheezes, rhonchi,rales ,or rubs present.No increased work of breathing.    Extremities:  No cyanosis, edema, or clubbing  noted    Skin: Warm & dry          Assessment & Plan:  #1 bronchitis acute, no symptoms of rhinosinusitis  Plan: She has a prescription for Z-Pak which  expires 9/12. She was instructed to fill this and take it. Cough syrup will also be initiated.

## 2010-11-13 ENCOUNTER — Ambulatory Visit (INDEPENDENT_AMBULATORY_CARE_PROVIDER_SITE_OTHER): Payer: Medicare Other | Admitting: Family Medicine

## 2010-11-13 ENCOUNTER — Encounter: Payer: Self-pay | Admitting: Family Medicine

## 2010-11-13 VITALS — BP 122/74 | Temp 98.7°F | Wt 170.0 lb

## 2010-11-13 DIAGNOSIS — J069 Acute upper respiratory infection, unspecified: Secondary | ICD-10-CM

## 2010-11-13 MED ORDER — BENZONATATE 200 MG PO CAPS: 200.0000 mg | ORAL_CAPSULE | Freq: Three times a day (TID) | ORAL | Status: AC | PRN

## 2010-11-13 MED ORDER — AZITHROMYCIN 250 MG PO TABS: 250.0000 mg | ORAL_TABLET | Freq: Every day | ORAL | Status: AC

## 2010-11-13 NOTE — Patient Instructions (Signed)
Take the Azithromycin if not feeling better in the next few days Use the cough meds as needed Add mucinex to thin your congestion Drink plenty of fluids Tylenol or ibuprofen for your sore throat REST! Enjoy your trip!

## 2010-11-13 NOTE — Progress Notes (Signed)
  Subjective:    Patient ID: Sierra Ford, female    DOB: January 30, 1941, 70 y.o.   MRN: [id number]  HPI Sore throat- sxs started on Saturday w/ sore throat.  Developed cough- productive of 'beige-y goop'.  Denies nasal congestion, ear pain, facial pain.  + sick contacts.  No fevers.   Review of Systems For ROS see HPI     Objective:   Physical Exam  Vitals reviewed. Constitutional: She appears well-developed and well-nourished. No distress.  HENT:  Head: Normocephalic and atraumatic.       TMs normal bilaterally Mild nasal congestion Throat w/out erythema, edema, or exudate  Eyes: Conjunctivae and EOM are normal. Pupils are equal, round, and reactive to light.  Neck: Normal range of motion. Neck supple.  Cardiovascular: Normal rate, regular rhythm, normal heart sounds and intact distal pulses.   Pulmonary/Chest: Effort normal and breath sounds normal. No respiratory distress. She has no wheezes.       + hacking cough  Lymphadenopathy:    She has no cervical adenopathy.          Assessment & Plan:

## 2010-11-14 NOTE — Assessment & Plan Note (Signed)
No obvious bacterial infxn on PE.  Pt is travelling this weekend and doesn't want to be out of town w/out abx.  Zpack script sent for pt to fill if no improvement.  Cough meds prn.  Reviewed supportive care and red flags that should prompt return.  Pt expressed understanding and is in agreement w/ plan.

## 2010-11-21 ENCOUNTER — Encounter: Payer: Self-pay | Admitting: Family Medicine

## 2010-11-22 ENCOUNTER — Ambulatory Visit (INDEPENDENT_AMBULATORY_CARE_PROVIDER_SITE_OTHER): Payer: Medicare Other | Admitting: Family Medicine

## 2010-11-22 ENCOUNTER — Encounter: Payer: Self-pay | Admitting: Family Medicine

## 2010-11-22 VITALS — BP 142/80 | HR 76 | Temp 98.8°F | Ht 64.0 in | Wt 173.4 lb

## 2010-11-22 DIAGNOSIS — R609 Edema, unspecified: Secondary | ICD-10-CM

## 2010-11-22 DIAGNOSIS — J329 Chronic sinusitis, unspecified: Secondary | ICD-10-CM

## 2010-11-22 DIAGNOSIS — Z0000 Encounter for general adult medical examination without abnormal findings: Secondary | ICD-10-CM

## 2010-11-22 DIAGNOSIS — E785 Hyperlipidemia, unspecified: Secondary | ICD-10-CM

## 2010-11-22 LAB — BASIC METABOLIC PANEL
Calcium: 9.6 mg/dL (ref 8.4–10.5)
GFR: 101.6 mL/min (ref >60.00)
Potassium: 4.2 mEq/L (ref 3.5–5.1)
Sodium: 143 mEq/L (ref 135–145)

## 2010-11-22 LAB — LIPID PANEL
Cholesterol: 306 mg/dL — ABNORMAL HIGH (ref 0–200)
HDL: 84.4 mg/dL (ref >39.00)
VLDL: 20 mg/dL (ref 0.0–40.0)

## 2010-11-22 LAB — POCT URINALYSIS DIPSTICK
Blood, UA: NEGATIVE
Glucose, UA: NEGATIVE
Protein, UA: NEGATIVE
Spec Grav, UA: 1.02
Urobilinogen, UA: 0.2

## 2010-11-22 LAB — CBC WITH DIFFERENTIAL/PLATELET
Basophils Absolute: 0 10*3/uL (ref 0.0–0.1)
Eosinophils Relative: 2.9 % (ref 0.0–5.0)
HCT: 38.6 % (ref 36.0–46.0)
Hemoglobin: 13 g/dL (ref 12.0–15.0)
Lymphocytes Relative: 36.2 % (ref 12.0–46.0)
Lymphs Abs: 2.6 10*3/uL (ref 0.7–4.0)
Monocytes Relative: 7.3 % (ref 3.0–12.0)
Neutro Abs: 3.8 10*3/uL (ref 1.4–7.7)
Platelets: 290 10*3/uL (ref 150.0–400.0)
WBC: 7.2 10*3/uL (ref 4.5–10.5)

## 2010-11-22 LAB — HEPATIC FUNCTION PANEL
ALT: 20 U/L (ref 0–35)
AST: 22 U/L (ref 0–37)
Alkaline Phosphatase: 88 U/L (ref 39–117)
Total Bilirubin: 0.7 mg/dL (ref 0.3–1.2)

## 2010-11-22 MED ORDER — FUROSEMIDE 20 MG PO TABS: 20.0000 mg | ORAL_TABLET | Freq: Every day | ORAL | Status: DC

## 2010-11-22 MED ORDER — FLUTICASONE PROPIONATE 50 MCG/ACT NA SUSP: NASAL | Status: DC

## 2010-11-22 MED ORDER — AMOXICILLIN-POT CLAVULANATE 875-125 MG PO TABS: 1.0000 | ORAL_TABLET | Freq: Two times a day (BID) | ORAL | Status: AC

## 2010-11-22 NOTE — Patient Instructions (Signed)

## 2010-11-22 NOTE — Progress Notes (Signed)
Subjective:    Sierra Ford is a 70 y.o. female who presents for Medicare Annual/Subsequent preventive examination.  Preventive Screening-Counseling & Management  Tobacco History  Smoking status  . Former Smoker  Smokeless tobacco  . Not on file     Problems Prior to Visit 1.   Current Problems (verified) Patient Active Problem List  Diagnoses  . HYPERLIPIDEMIA  . OTHER NONTHROMBOCYTOPENIC PURPURAS  . DIASTOLIC DYSFUNCTION  . CAROTID ARTERY DISEASE  . PREGNANCY, ECTOPIC NEC W/INTRAUTERINE PRG  . CERVICAL STRAIN, WITH RADICULOPATHY  . RASH-NONVESICULAR  . LEG EDEMA, RIGHT  . FREQUENCY, URINARY  . FASTING HYPERGLYCEMIA  . CERVICAL STRAIN  . Personal History of Venous Thrombosis and Embolism  . HX, URINARY INFECTION  . URI (upper respiratory infection)    Medications Prior to Visit Current Outpatient Prescriptions on File Prior to Visit  Medication Sig Dispense Refill  . aspirin 81 MG tablet Take 81 mg by mouth daily.        . Calcium Carbonate (CALCIUM 600 PO) Take 600 mg by mouth.        . cholecalciferol (VITAMIN D) 1000 UNITS tablet Take 1,000 Units by mouth daily.        . furosemide (LASIX) 20 MG tablet Take 20 mg by mouth daily.        . Multiple Vitamin (MULTIVITAMIN) tablet Take 1 tablet by mouth daily.        . Omega-3 Fatty Acids (FISH OIL) 1000 MG CAPS Take by mouth daily.          Current Medications (verified) Current Outpatient Prescriptions  Medication Sig Dispense Refill  . aspirin 81 MG tablet Take 81 mg by mouth daily.        . Calcium Carbonate (CALCIUM 600 PO) Take 600 mg by mouth.        . cholecalciferol (VITAMIN D) 1000 UNITS tablet Take 1,000 Units by mouth daily.        . furosemide (LASIX) 20 MG tablet Take 20 mg by mouth daily.        . Multiple Vitamin (MULTIVITAMIN) tablet Take 1 tablet by mouth daily.        . Omega-3 Fatty Acids (FISH OIL) 1000 MG CAPS Take by mouth daily.           Allergies (verified) Review of patient's  allergies indicates no known allergies.   PAST HISTORY  Family History History reviewed. No pertinent family history.  Social History History  Substance Use Topics  . Smoking status: Former Games developer  . Smokeless tobacco: Not on file  . Alcohol Use: No     Are there smokers in your home (other than you)? No  Risk Factors Current exercise habits: The patient does not participate in regular exercise at present.  Dietary issues discussed: NA  Cardiac risk factors: advanced age (older than 24 for men, 85 for women), dyslipidemia and sedentary lifestyle.  Depression Screen (Note: if answer to either of the following is "Yes", a more complete depression screening is indicated)   Over the past two weeks, have you felt down, depressed or hopeless? No  Over the past two weeks, have you felt little interest or pleasure in doing things? No  Have you lost interest or pleasure in daily life? No  Do you often feel hopeless? No  Do you cry easily over simple problems? No  Activities of Daily Living In your present state of health, do you have any difficulty performing the following activities?:  Driving?  No Managing money?  No Feeding yourself? No Getting from bed to chair? No Climbing a flight of stairs? No Preparing food and eating?: No Bathing or showering? No Getting dressed: No Getting to the toilet? No Using the toilet:No Moving around from place to place: No In the past year have you fallen or had a near fall?:No   Are you sexually active?  No  Do you have more than one partner?  No  Hearing Difficulties: Yes Do you often ask people to speak up or repeat themselves? Yes Do you experience ringing or noises in your ears? No Do you have difficulty understanding soft or whispered voices? Yes   Do you feel that you have a problem with memory? No  Do you often misplace items? No  Do you feel safe at home?  Yes  Cognitive Testing  Alert? Yes  Normal Appearance?Yes  Oriented  to person? Yes  Place? Yes   Time? Yes  Recall of three objects?  Yes  Can perform simple calculations? Yes  Displays appropriate judgment?Yes  Can read the correct time from a watch face?Yes   Advanced Directives have been discussed with the patient? Yes  List the Names of Other Physician/Practitioners you currently use: 1.  none  Indicate any recent Medical Services you may have received from other than [facility] providers in the past year (date may be approximate).  Immunization History  Administered Date(s) Administered  . Pneumococcal Polysaccharide 08/07/2007  . Td 03/03/2003  . Zoster 08/14/2007, 11/13/2007    Screening Tests Health Maintenance  Topic Date Due  . Mammogram  11/23/2007  . Influenza Vaccine  11/20/2011  . Colonoscopy  10/22/2012  . Tetanus/tdap  03/02/2013  . Pneumococcal Polysaccharide Vaccine Age 28 And Over  Completed  . Zostavax  Completed    All answers were reviewed with the patient and necessary referrals were made:  Loreen Freud, DO   11/22/2010   History reviewed: allergies, current medications, past family history, past medical history, past social history, past surgical history and problem list  Review of Systems  Review of Systems  Constitutional: Negative for activity change, appetite change and fatigue.  HENT: Negative for hearing loss, congestion, tinnitus and ear discharge.  dentist  q61m Eyes: Negative for visual disturbance (see optho q1y -- vision corrected to 20/20 with glasses).  Respiratory: Negative for cough, chest tightness and shortness of breath.   Cardiovascular: Negative for chest pain, palpitations and leg swelling.  Gastrointestinal: Negative for abdominal pain, diarrhea, constipation and abdominal distention.  Genitourinary: Negative for urgency, frequency, decreased urine volume and difficulty urinating.  Musculoskeletal: Negative for back pain, arthralgias and gait problem.  Skin: Negative for color change, pallor and rash.    Neurological: Negative for dizziness, light-headedness, numbness and headaches.  Hematological: Negative for adenopathy. Does not bruise/bleed easily.  Psychiatric/Behavioral: Negative for suicidal ideas, confusion, sleep disturbance, self-injury, dysphoric mood, decreased concentration and agitation.  Pt is able to read and write and can do all ADLs No risk for falling No abuse/ violence in home      Objective:     Vision by Snellen chart: right EAV:WUJWJ, left XBJ:YNWGN  Body mass index is 29.76 kg/(m^2). BP 142/80  Pulse 76  Temp(Src) 98.8 F (37.1 C) (Oral)  Ht 5\' 4"  (1.626 m)  Wt 173 lb 6.4 oz (78.654 kg)  BMI 29.76 kg/m2  SpO2 98%  BP 142/80  Pulse 76  Temp(Src) 98.8 F (37.1 C) (Oral)  Ht 5\' 4"  (1.626 m)  Wt  173 lb 6.4 oz (78.654 kg)  BMI 29.76 kg/m2  SpO2 98%  General Appearance:    Alert, cooperative, no distress, appears stated age  Head:    Normocephalic, without obvious abnormality, atraumatic  Eyes:    PERRL, conjunctiva/corneas clear, EOM's intact, fundi    benign, both eyes  Ears:    Normal TM's and external ear canals, both ears  Nose:   Nares normal, septum midline, + sinus tenderness  B/l  + green mucous  Throat:   Lips, mucosa, and tongue normal; teeth and gums normal  Neck:   Supple, symmetrical, trachea midline,    thyroid:  no enlargement/tenderness/nodules; no carotid   bruit or JVD,  + cervical adenopathy  Back:     Symmetric, no curvature, ROM normal, no CVA tenderness  Lungs:     Clear to auscultation bilaterally, respirations unlabored  Chest Wall:    No tenderness or deformity   Heart:    Regular rate and rhythm, S1 and S2 normal, no murmur, rub   or gallop  Breast Exam:   gyn  Abdomen:     Soft, non-tender, bowel sounds active all four quadrants,    no masses, no organomegaly  Genitalia:    gyn  Rectal:   gyn  Extremities:   Extremities normal, atraumatic, no cyanosis or edema  Pulses:   2+ and symmetric all extremities  Skin:    Skin color, texture, turgor normal, no rashes or lesions  Lymph nodes:   Cervical, supraclavicular, and axillary nodes normal  Neurologic:   CNII-XII intact, normal strength, sensation and reflexes    throughout       Assessment:    CPE Sinusitis--- abx--take all 10 days and use nasonex-- consider ENT if no better      Plan:     During the course of the visit the patient was educated and counseled about appropriate screening and preventive services including:    Pneumococcal vaccine   Td vaccine  Screening electrocardiogram  Screening mammography  Screening Pap smear and pelvic exam   Colorectal cancer screening  Advanced directives: pt will discuss with her attorney  Diet review for nutrition referral? Yes ____  Not Indicated _x___   Patient Instructions (the written plan) was given to the patient.  Medicare Attestation I have personally reviewed: The patient's medical and social history Their use of alcohol, tobacco or illicit drugs Their current medications and supplements The patient's functional ability including ADLs,fall risks, home safety risks, cognitive, and hearing and visual impairment Diet and physical activities Evidence for depression or mood disorders  The patient's weight, height, BMI, and visual acuity have been recorded in the chart.  I have made referrals, counseling, and provided education to the patient based on review of the above and I have provided the patient with a written personalized care plan for preventive services.     Loreen Freud, DO   11/22/2010

## 2010-11-30 ENCOUNTER — Other Ambulatory Visit: Payer: Self-pay | Admitting: Family Medicine

## 2010-11-30 MED ORDER — PRAVASTATIN SODIUM 20 MG PO TABS: 20.0000 mg | ORAL_TABLET | Freq: Every evening | ORAL | Status: DC

## 2010-11-30 NOTE — Telephone Encounter (Signed)
Discussed with patient and advised she still have 2 days worth and she stated she did, I advised her to continue to take her meds and if not better in a few days to give [name] a call back and she agreed.... Discussed labs :Cholesterol--- LDL goal < 100, HDL >40, TG < 150. Diet and exercise will increase HDL and decrease LDL and TG. Fish, Fish Oil, Flaxseed oil will also help increase the HDL and decrease Triglycerides. Recheck labs in 3 months.---- cholesterol is still high even though good cholesterol is high----LDL still too high----- really needs to start medication---- pravachol 20 mg #30- 1 po qhs , 2 refills ----------------272.4 Lipid, hep.    Rx sent to the pharmacy     KP

## 2010-11-30 NOTE — Telephone Encounter (Signed)
Addended by: Arnette Norris on: 11/30/2010 03:34 PM   Modules accepted: Orders

## 2010-11-30 NOTE — Telephone Encounter (Signed)
This pt called and is hoping to get another Amoxicillin-clavulanate rx.  She states she is still having symptoms.  She is aware that she does not have refills for this medicine and stated if she needed to come back in, she would make an appt.  She also wanted me to include in the message that her blood pressure yesterday (10/10) was 127/82.

## 2010-12-12 ENCOUNTER — Telehealth: Payer: Self-pay | Admitting: Family Medicine

## 2010-12-12 NOTE — Telephone Encounter (Signed)
FYI

## 2010-12-12 NOTE — Telephone Encounter (Signed)
The pt called and stated her blood pressure was 110/70 at a drs office tomorrow.  She wanted to give this info to the dr.

## 2010-12-15 ENCOUNTER — Telehealth: Payer: Self-pay | Admitting: Family Medicine

## 2010-12-15 DIAGNOSIS — J329 Chronic sinusitis, unspecified: Secondary | ICD-10-CM

## 2010-12-15 NOTE — Telephone Encounter (Signed)
Please advise      KP 

## 2010-12-15 NOTE — Telephone Encounter (Signed)
Ok to refer to ent 

## 2010-12-15 NOTE — Telephone Encounter (Signed)
Done  KP

## 2011-01-01 ENCOUNTER — Emergency Department (HOSPITAL_COMMUNITY): Payer: Medicare Other

## 2011-01-01 ENCOUNTER — Other Ambulatory Visit: Payer: Self-pay

## 2011-01-01 ENCOUNTER — Emergency Department (HOSPITAL_COMMUNITY)
Admission: EM | Admit: 2011-01-01 | Discharge: 2011-01-01 | Disposition: A | Discharge status: Home / Self Care | Payer: Medicare Other | Attending: Emergency Medicine | Admitting: Emergency Medicine

## 2011-01-01 ENCOUNTER — Encounter (HOSPITAL_COMMUNITY): Payer: Self-pay

## 2011-01-01 DIAGNOSIS — Z86718 Personal history of other venous thrombosis and embolism: Secondary | ICD-10-CM | POA: Insufficient documentation

## 2011-01-01 DIAGNOSIS — Z79899 Other long term (current) drug therapy: Secondary | ICD-10-CM | POA: Insufficient documentation

## 2011-01-01 DIAGNOSIS — R0789 Other chest pain: Secondary | ICD-10-CM | POA: Insufficient documentation

## 2011-01-01 DIAGNOSIS — E785 Hyperlipidemia, unspecified: Secondary | ICD-10-CM | POA: Insufficient documentation

## 2011-01-01 DIAGNOSIS — Z7982 Long term (current) use of aspirin: Secondary | ICD-10-CM | POA: Insufficient documentation

## 2011-01-01 LAB — COMPREHENSIVE METABOLIC PANEL
CO2: 24 mEq/L (ref 19–32)
Calcium: 10.1 mg/dL (ref 8.4–10.5)
Creatinine, Ser: 0.86 mg/dL (ref 0.50–1.10)
GFR calc Af Amer: 78 mL/min — ABNORMAL LOW (ref >90)
GFR calc non Af Amer: 67 mL/min — ABNORMAL LOW (ref >90)
Glucose, Bld: 103 mg/dL — ABNORMAL HIGH (ref 70–99)

## 2011-01-01 LAB — POCT I-STAT TROPONIN I: Troponin i, poc: 0 ng/mL (ref 0.00–0.08)

## 2011-01-01 LAB — D-DIMER, QUANTITATIVE: D-Dimer, Quant: 0.8 ug/mL-FEU — ABNORMAL HIGH (ref 0.00–0.48)

## 2011-01-01 LAB — CBC
Hemoglobin: 14.1 g/dL (ref 12.0–15.0)
MCH: 31.5 pg (ref 26.0–34.0)
MCHC: 34.1 g/dL (ref 30.0–36.0)
MCV: 92.4 fL (ref 78.0–100.0)
RBC: 4.47 MIL/uL (ref 3.87–5.11)

## 2011-01-01 MED ORDER — IOHEXOL 300 MG/ML  SOLN
100.0000 mL | Freq: Once | INTRAMUSCULAR | Status: AC | PRN
  Administered 2011-01-01: 100 mL via INTRAVENOUS

## 2011-01-01 NOTE — ED Notes (Signed)
Pt discharged home, given instructions and states understanding.  Denies further questions or needs at present.  

## 2011-01-01 NOTE — ED Provider Notes (Cosign Needed Addendum)
History     CSN: [id number] Arrival date & time: 01/01/2011  7:10 AM   First MD Initiated Contact with Patient 01/01/11 [phone]      Chief Complaint  Patient presents with  . Chest Pain    (Consider location/radiation/quality/duration/timing/severity/associated sxs/prior treatment) Patient is a 70 y.o. female presenting with chest pain.  Chest Pain Pertinent negatives for primary symptoms include no cough and no palpitations.    pleasant 70 year old female with remote history of pulmonary embolism and dyslipidemia. According to chart review had a normal cardiac catheterization in 2006. She, states she's been having intermittent, but persistent chest pressure since Saturday. It is not necessarily associated with exertion. However, she admits she has not been exerting herself. She has had no shortness of breath. No nausea, vomiting. She denies any leg swelling or pain but states she had a "funny feeling" in her left leg 2 days ago. Patient states she was concerned after reading symptoms on lying in regarding to her symptoms. She denies any fever or cough. Denies any specific alleviating or aggravating factors  Past Medical History  Diagnosis Date  . DVT (deep venous thrombosis)     hx of  . Hyperlipidemia   . PE (pulmonary embolism)     hx of  . Hyperglycemia   . UTI (lower urinary tract infection)     hx  . Ectopic pregnancy with intrauterine pregnancy     Past Surgical History  Procedure Date  . Foot surgery   . Laproscopic surgery 1975  . Cortisone shot     History reviewed. No pertinent family history.  History  Substance Use Topics  . Smoking status: Former Smoker    Quit date: 11/21/2009  . Smokeless tobacco: Not on file  . Alcohol Use: No    OB History    Grav Para Term Preterm Abortions TAB SAB Ect Mult Living                  Review of Systems  Respiratory: Negative for cough.   Cardiovascular: Positive for chest pain. Negative for palpitations and leg  swelling.  All other systems reviewed and are negative.    Allergies  Review of patient's allergies indicates no known allergies.  Home Medications   Current Outpatient Rx  Name Route Sig Dispense Refill  . ASPIRIN EC 325 MG PO TBEC Oral Take 1,300 mg by mouth every 6 (six) hours as needed.      . ASPIRIN EC 81 MG PO TBEC Oral Take 81 mg by mouth at bedtime.      Marland Kitchen CALCIUM-VITAMIN D PO Oral Take 600 Units by mouth 2 (two) times daily.      . CHOLECALCIFEROL 400 UNITS PO TABS Oral Take 800 Units by mouth daily.      . FUROSEMIDE 20 MG PO TABS Oral Take 20 mg by mouth daily.      . MULTIVITAMIN PO Oral Take 1 tablet by mouth at bedtime.      . OMEGA-3-ACID ETHYL ESTERS 1 G PO CAPS Oral Take 1 g by mouth 3 (three) times daily.      Marland Kitchen PRAVASTATIN SODIUM 20 MG PO TABS Oral Take 20 mg by mouth daily.        BP 128/90  Pulse 62  Temp(Src) 98.5 F (36.9 C) (Oral)  Resp 16  SpO2 98%  Physical Exam  Constitutional: She is oriented to person, place, and time. She appears well-developed and well-nourished.  HENT:  Head: Normocephalic and atraumatic.  Eyes: Conjunctivae and EOM are normal. Pupils are equal, round, and reactive to light.  Neck: Neck supple.  Cardiovascular: Normal rate, regular rhythm and normal heart sounds.  Exam reveals no gallop and no friction rub.   No murmur heard. Pulmonary/Chest: Breath sounds normal. She has no wheezes. She has no rales. She exhibits no tenderness.  Abdominal: Soft. Bowel sounds are normal. She exhibits no distension. There is no tenderness. There is no rebound and no guarding.  Musculoskeletal: Normal range of motion. She exhibits no edema and no tenderness.  Neurological: She is alert and oriented to person, place, and time. No cranial nerve deficit. Coordination normal.  Skin: Skin is warm and dry. No rash noted.  Psychiatric: She has a normal mood and affect.    ED Course  Procedures (including critical care time)  Labs Reviewed    COMPREHENSIVE METABOLIC PANEL - Abnormal; Notable for the following:    Glucose, Bld 103 (*)    GFR calc non Af Amer 67 (*)    GFR calc Af Amer 78 (*)    All other components within normal limits  D-DIMER, QUANTITATIVE - Abnormal; Notable for the following:    D-Dimer, Quant 0.80 (*)    All other components within normal limits  CBC  POCT I-STAT TROPONIN I  I-STAT TROPONIN I   Ct Angio Chest W/cm &/or Wo Cm  01/01/2011  *RADIOLOGY REPORT*  Clinical Data:  Chest pressure for 2 days.  CT ANGIOGRAPHY CHEST WITH CONTRAST  Technique:  Multidetector CT imaging of the chest was performed using the standard protocol during bolus administration of intravenous contrast.  Multiplanar CT image reconstructions including MIPs were obtained to evaluate the vascular anatomy.  Contrast: OMNIPAQUE IOHEXOL 300 MG/ML IV SOLN  Comparison:  Plain film of the chest 01/01/2011 at 8:18 am.  Findings:  No pulmonary embolus is identified.  There is no axillary, hilar or mediastinal lymphadenopathy.  Mild cardiomegaly is noted.  Lungs demonstrate some linear scar in the right upper lobe.  The lungs are otherwise clear.  Incidentally imaged upper abdomen is unremarkable.  No focal bony abnormality.  Review of the MIP images confirms the above findings.  IMPRESSION: Negative for pulmonary embolus or other acute abnormality.  Small hiatal hernia.  Original Report Authenticated By: Bernadene Bell. D'ALESSIO, M.D.   Dg Chest Portable 1 View  01/01/2011  *RADIOLOGY REPORT*  Clinical Data: chest pain and shortness of breath  PORTABLE CHEST - 1 VIEW  Comparison: 11/14/2009  Findings: Heart size is normal.  There is a right upper lobe scarring.  No airspace consolidation or pulmonary edema.  No pleural effusions.  IMPRESSION:  1.  No acute cardiopulmonary abnormalities.  Original Report Authenticated By: Rosealee Albee, M.D.     1. Chest tightness, discomfort, or pressure       MDM  Patient is seen and examined, initial  history and physical is completed. Evaluation initiated        Cindy Brindisi A. Patrica Duel, MD 01/01/11 0750  9:48 AM  Date: 01/01/2011  Rate: 63  Rhythm: normal sinus rhythm  QRS Axis: left  Intervals: normal  ST/T Wave abnormalities: normal  Conduction Disutrbances:none  Narrative Interpretation:   Old EKG Reviewed: none available LVH      Merranda Bolls A. Patrica Duel, MD 01/01/11 0803  9:48 AM  Results for orders placed during the hospital encounter of 01/01/11  CBC      Component Value Range   WBC 6.1  4.0 - 10.5 (K/uL)  RBC 4.47  3.87 - 5.11 (MIL/uL)   Hemoglobin 14.1  12.0 - 15.0 (g/dL)   HCT 16.1  09.6 - 04.5 (%)   MCV 92.4  78.0 - 100.0 (fL)   MCH 31.5  26.0 - 34.0 (pg)   MCHC 34.1  30.0 - 36.0 (g/dL)   RDW 40.9  81.1 - 91.4 (%)   Platelets 252  150 - 400 (K/uL)  COMPREHENSIVE METABOLIC PANEL      Component Value Range   Sodium 139  135 - 145 (mEq/L)   Potassium 4.1  3.5 - 5.1 (mEq/L)   Chloride 104  96 - 112 (mEq/L)   CO2 24  19 - 32 (mEq/L)   Glucose, Bld 103 (*) 70 - 99 (mg/dL)   BUN 23  6 - 23 (mg/dL)   Creatinine, Ser 7.82  0.50 - 1.10 (mg/dL)   Calcium 95.6  8.4 - 10.5 (mg/dL)   Total Protein 7.3  6.0 - 8.3 (g/dL)   Albumin 3.8  3.5 - 5.2 (g/dL)   AST 18  0 - 37 (U/L)   ALT 14  0 - 35 (U/L)   Alkaline Phosphatase 91  39 - 117 (U/L)   Total Bilirubin 0.3  0.3 - 1.2 (mg/dL)   GFR calc non Af Amer 67 (*) >90 (mL/min)   GFR calc Af Amer 78 (*) >90 (mL/min)  D-DIMER, QUANTITATIVE      Component Value Range   D-Dimer, Quant 0.80 (*) 0.00 - 0.48 (ug/mL-FEU)  POCT I-STAT TROPONIN I      Component Value Range   Troponin i, poc 0.00  0.00 - 0.08 (ng/mL)   Comment 3            No results found.    Wrenly Lauritsen A. Patrica Duel, MD 01/01/11 [phone]  Patient reassessed in the room, she has been essentially chest pain-free. The entire time here she looks very comfortable, reading a newspaper. Her initial workup is negative. EKG is normal. Troponin is 0. CT scan showed no pulmonary  embolus. This was discussed with Dr. Shirlee Latch from the lower cardiology, who also agrees. The patient will be stable for outpatient followup. They will contact her to make follow up appointment for this week. Patient was told to return to the ER for any concerns or changing symptoms  Christine Morton A. Patrica Duel, MD 01/01/11 1053

## 2011-01-01 NOTE — ED Notes (Signed)
Patient presents with midsternal chest pressure since Saturday afternoon without radiation, nausea or vomiting. Patient reports pressure to be 3/10.

## 2011-01-08 ENCOUNTER — Other Ambulatory Visit (HOSPITAL_COMMUNITY): Payer: Self-pay | Admitting: Cardiology

## 2011-01-08 DIAGNOSIS — R0609 Other forms of dyspnea: Secondary | ICD-10-CM

## 2011-01-08 DIAGNOSIS — R079 Chest pain, unspecified: Secondary | ICD-10-CM

## 2011-01-15 ENCOUNTER — Ambulatory Visit (HOSPITAL_COMMUNITY): Payer: Medicare Other | Attending: Cardiology | Admitting: Radiology

## 2011-01-15 VITALS — Ht 64.0 in | Wt 167.0 lb

## 2011-01-15 DIAGNOSIS — E785 Hyperlipidemia, unspecified: Secondary | ICD-10-CM | POA: Insufficient documentation

## 2011-01-15 DIAGNOSIS — R0602 Shortness of breath: Secondary | ICD-10-CM

## 2011-01-15 DIAGNOSIS — R079 Chest pain, unspecified: Secondary | ICD-10-CM

## 2011-01-15 DIAGNOSIS — I6529 Occlusion and stenosis of unspecified carotid artery: Secondary | ICD-10-CM | POA: Insufficient documentation

## 2011-01-15 DIAGNOSIS — Z8249 Family history of ischemic heart disease and other diseases of the circulatory system: Secondary | ICD-10-CM | POA: Insufficient documentation

## 2011-01-15 DIAGNOSIS — R0989 Other specified symptoms and signs involving the circulatory and respiratory systems: Secondary | ICD-10-CM | POA: Insufficient documentation

## 2011-01-15 DIAGNOSIS — I4949 Other premature depolarization: Secondary | ICD-10-CM

## 2011-01-15 DIAGNOSIS — R0789 Other chest pain: Secondary | ICD-10-CM | POA: Insufficient documentation

## 2011-01-15 DIAGNOSIS — R0609 Other forms of dyspnea: Secondary | ICD-10-CM | POA: Insufficient documentation

## 2011-01-15 DIAGNOSIS — Z87891 Personal history of nicotine dependence: Secondary | ICD-10-CM | POA: Insufficient documentation

## 2011-01-15 DIAGNOSIS — I10 Essential (primary) hypertension: Secondary | ICD-10-CM | POA: Insufficient documentation

## 2011-01-15 MED ORDER — TECHNETIUM TC 99M TETROFOSMIN IV KIT
11.0000 | PACK | Freq: Once | INTRAVENOUS | Status: AC | PRN
  Administered 2011-01-15: 11 via INTRAVENOUS

## 2011-01-15 MED ORDER — TECHNETIUM TC 99M TETROFOSMIN IV KIT
33.0000 | PACK | Freq: Once | INTRAVENOUS | Status: AC | PRN
  Administered 2011-01-15: 33 via INTRAVENOUS

## 2011-01-15 NOTE — Progress Notes (Addendum)
[facility] SITE 3 NUCLEAR MED [address] [place] [address] [phone]  Cardiology Nuclear Med Study  Sierra Ford is a 70 y.o. female [id number] 08-Feb-1941   Nuclear Med Background Indication for Stress Test:  Evaluation for Ischemia and 01/01/11 [facility] ED: Chest Pain, (-) enzymes History:  '06 OZH:[id number] ischemia>Cath:Normal coronaries, EF=65%; '11 Echo:EF=55-60%; h/o DVT/PE Cardiac Risk Factors: Carotid Disease:Mild hard plaque in carotid bulbs, ICA arteries normal, Family History - CAD, History of Smoking, Hypertension and Lipids  Symptoms:  Chest Pressure.  (last date of chest discomfort is constant, 1/10 now) and DOE   Nuclear Pre-Procedure Caffeine/Decaff Intake:  None NPO After: 11:00pm   Lungs:  Clear. IV 0.9% NS with Angio Cath:  22g  IV Site: R Hand x 1, tolerated well IV Started by:  Irean Hong, RN  Chest Size (in):  38 Cup Size: DDD  Height: 5\' 4"  (1.626 m)  Weight:  167 lb (75.751 kg)  BMI:  Body mass index is 28.67 kg/(m^2). Tech Comments:  N/A    Nuclear Med Study 1 or 2 day study: 1 day  Stress Test Type:  Stress  Reading MD: Olga Millers, MD  Order Authorizing Provider: Marca Ancona, MD  Resting Radionuclide: Technetium 25m Tetrofosmin  Resting Radionuclide Dose: 11.0 mCi   Stress Radionuclide:  Technetium 80m Tetrofosmin  Stress Radionuclide Dose: 33.0 mCi           Stress Protocol Rest HR: 56 Stress HR: 131  Rest BP: Sitting:129/76  Standing:121/66 Stress BP: 174/80  Exercise Time (min): 7:01 METS: 7.3   Predicted Max HR: 151 bpm % Max HR: 86.75 bpm Rate Pressure Product: 46962   Dose of Adenosine (mg):  n/a Dose of Lexiscan: n/a mg  Dose of Atropine (mg): n/a Dose of Dobutamine: n/a mcg/kg/min (at max HR)  Stress Test Technologist: Smiley Houseman, CMA-N  Nuclear Technologist:  Doyne Keel, CNMT     Rest Procedure:  Myocardial perfusion imaging was performed at rest 45 minutes following the intravenous  administration of Technetium 62m Tetrofosmin.  Rest ECG: No acute changes, occasional PVC's.  Stress Procedure:  The patient exercised for 7:01 on the treadmill utilizing the Bruce protocol.  The patient stopped due to fatigue and denied any chest pain.  There were no diagnostic ST-T wave changes.  There were occasional PVC's noted.   Technetium 49m Tetrofosmin was injected at peak exercise and myocardial perfusion imaging was performed after a brief delay.  Stress ECG: No significant ST segment change suggestive of ischemia.  QPS Raw Data Images:  Acquisition technically good; normal left ventricular size. Stress Images:  There is decreased uptake in the apex. Rest Images:  There is decreased uptake in the apex, slightly less prominent compared to the stress images. Subtraction (SDS):  These findings are consistent with apical thinning; cannot R/O minimal apical ischemia. Transient Ischemic Dilatation (Normal <1.22):  1.08 Lung/Heart Ratio (Normal <0.45):  0.41  Quantitative Gated Spect Images QGS EDV:  59 ml QGS ESV:  15 ml QGS cine images:  NL LV Function; NL Wall Motion QGS EF: 75%  Impression Exercise Capacity:  Fair exercise capacity. BP Response:  Normal blood pressure response. Clinical Symptoms:  No chest pain. ECG Impression:  No significant ST segment change suggestive of ischemia. Comparison with Prior Nuclear Study: No images to compare  Overall Impression:  Low risk stress nuclear study with a small, partially reversible apical defect most likely related to apical thinning; cannot R/O very mild apical  ischemia.   Olga Millers   Small apical perfusion defect that may be apical thinning (artifact) but cannot rule out mild apical ischemia.  Overall low risk.  She should have followup in the office to see if she has had recurrent symptoms.  Dal[employer]

## 2011-01-16 NOTE — Progress Notes (Signed)
appt 01/22/11 with Tereso Newcomer

## 2011-01-19 NOTE — Progress Notes (Signed)
Patient can follow up with Tereso Newcomer next week.  Low risk scan.

## 2011-01-22 ENCOUNTER — Encounter: Payer: Self-pay | Admitting: Internal Medicine

## 2011-01-22 ENCOUNTER — Encounter: Payer: Self-pay | Admitting: Physician Assistant

## 2011-01-22 ENCOUNTER — Ambulatory Visit (INDEPENDENT_AMBULATORY_CARE_PROVIDER_SITE_OTHER): Payer: Medicare Other | Admitting: Physician Assistant

## 2011-01-22 DIAGNOSIS — K449 Diaphragmatic hernia without obstruction or gangrene: Secondary | ICD-10-CM

## 2011-01-22 DIAGNOSIS — R9439 Abnormal result of other cardiovascular function study: Secondary | ICD-10-CM

## 2011-01-22 DIAGNOSIS — R350 Frequency of micturition: Secondary | ICD-10-CM

## 2011-01-22 DIAGNOSIS — R079 Chest pain, unspecified: Secondary | ICD-10-CM | POA: Insufficient documentation

## 2011-01-22 MED ORDER — OMEPRAZOLE 20 MG PO CPDR: 20.0000 mg | DELAYED_RELEASE_CAPSULE | Freq: Every day | ORAL | Status: DC

## 2011-01-22 NOTE — Progress Notes (Signed)
Low risk study H/o normal cors by cath in 2006 Follow up office visit today Tereso Newcomer, PA-C  8:33 AM 01/22/2011

## 2011-01-22 NOTE — Assessment & Plan Note (Addendum)
Check u/a and cx today.  Otherwise, follow up with PCP.  Update - patient unable to leave sample.  She has been advised to follow up with her PCP for evaluation.

## 2011-01-22 NOTE — Assessment & Plan Note (Signed)
I suspect this is related to her hiatal hernia.  Symptoms are atypical for ischemia.  They persisted for 72 hours with negative troponins and CT was neg for pulmonary embolism.  Start Prilosec 20 mg QD.  Refer to GI.

## 2011-01-22 NOTE — Assessment & Plan Note (Signed)
Abnormality is identical to the one in 2006 when she had a normal cath.  Start PPI and refer to GI.  Follow up with Dr. Rollene Rotunda or me in 4-6 weeks.  Return sooner if symptoms worse.

## 2011-01-22 NOTE — Patient Instructions (Signed)
Your physician recommends that you schedule a follow-up appointment in: 4-6 WEEKS WITH DR. HOCHREIN, IF NOT AVAILABLE THEN WITH SCOTT WEAVER, PA-C SAME DAY DR. Antoine Poche IS IN THE OFFICE  Your physician recommends that you return for lab work in: U/A AND CULTURE, URINARY FREQUENCY  You have been referred to [facility] GASTR[facility]OLOGY FOR HIATAL HERNIA AND CHEST PAIN  Your physician has recommended you make the following change in your medication: START PRILOSEC 20 MG DAILY

## 2011-01-22 NOTE — Progress Notes (Signed)
[address] [place], [place]  [address] Phone: [phone] Fax:  [phone]  Date:  01/22/2011   Name:  Sierra Ford       DOB:  09-06-1940 MRN:  [id number]  PCP:  Dr. Laury Axon Primary Cardiologist:  Dr. Rollene Rotunda    History of Present Illness: Sierra Ford is a 70 y.o. female who presents for post ED visit.  She was evaluated by Dr. Rollene Rotunda in 4/11 for edema.  Echo done in 3/11:  EF 55-60%, grade 1 diast dysfxn.  BNP at that time was normal.  She also has a h/o DVT and pulmonary emboli for which she was previously on coumadin, mild carotid plaque, normal coronary arteries by [facility] in 12/2004. Of note, she had a Cardiolite prior to her cath that demonstrated apical ischemia.  She was seen in the ED 01/01/11 with chest pain.   Troponin was negative.  DDimer was elevated and CT angio of the chest demonstrated no pulmonary embolism (she does have a small hiatal hernia).  Case was discussed with Dr. Marca Ancona over the phone and outpatient stress test was recommended with follow up.  Myoview demonstrated partially reversible apical defect likely artifact but very mild ischemia could not be ruled out.  She returns for follow up.  She notes her chest discomfort was a pressure and persisted for 72 hours prior to presenting to the ED.  Her pain lasted for several days afterward and eventually subsided.  She is pain free today.  No aggravating or alleviating factors.  No exertional chest pain.  Notes DOE that is chronic without change over the past several years.  No orthopnea, PND or edema.  No syncope.    Past Medical History  Diagnosis Date  . DVT (deep venous thrombosis)     hx of  . Hyperlipidemia   . PE (pulmonary embolism)     hx of  . Hyperglycemia   . UTI (lower urinary tract infection)     hx  . Ectopic pregnancy with intrauterine pregnancy   . Chest pain     CLite with apical ischemia in 2006 - normal coronary arteries by [facility] in 12/2004;   Myoview 11/12:  Low risk stress nuclear study with a small, partially reversible apical defect most likely related to apical thinning; cannot R/O very mild apical ischemia.  EF: 75%     Current Outpatient Prescriptions  Medication Sig Dispense Refill  . aspirin EC 325 MG tablet Take 1,300 mg by mouth every 6 (six) hours as needed.        Marland Kitchen aspirin EC 81 MG tablet Take 81 mg by mouth at bedtime.        Marland Kitchen CALCIUM-VITAMIN D PO Take 600 Units by mouth 2 (two) times daily.        . cholecalciferol (VITAMIN D-400) 400 UNITS TABS Take 800 Units by mouth daily.        . furosemide (LASIX) 20 MG tablet Take 20 mg by mouth daily.        . Multiple Vitamin (MULTIVITAMIN PO) Take 1 tablet by mouth at bedtime.        Marland Kitchen omega-3 acid ethyl esters (LOVAZA) 1 G capsule Take 1 g by mouth 3 (three) times daily.        . pravastatin (PRAVACHOL) 20 MG tablet Take 20 mg by mouth daily.          Allergies: No Known Allergies  History  Substance Use Topics  .  Smoking status: Former Smoker -- 50 years    Quit date: 11/21/2009  . Smokeless tobacco: Not on file  . Alcohol Use: No     Family History  Problem Relation Age of Onset  . Heart failure Mother      ROS:  Please see the history of present illness.   Gastrointestinal ROS: positive for - acid regurgitation with lying flat; negative for - blood in stools, heartburn or melena.  Genito-Urinary ROS: positive for - urinary frequency/urgency.   All other systems reviewed and negative.   PHYSICAL EXAM: Vital Signs:  BP 126/70  Pulse 64  Resp 18  Ht 5\' 2"  (1.575 m)  Wt 166 lb 12.8 oz (75.66 kg)  BMI 30.51 kg/m2 Well nourished, well developed, in no acute distress HEENT: normal Neck: no JVD Vascular: no carotid bruits Cardiac:  normal S1, S2; RRR; no murmur Lungs:  clear to auscultation bilaterally, no wheezing, rhonchi or rales Abd: soft, nontender, no hepatomegaly Ext: no edema Skin: warm and dry Neuro:  CNs 2-12 intact, no focal abnormalities  noted Psych: normal affect   EKG:   NSR, HR 64, LAD, PRWP, PVC, NSSTTW changes, no change from prior  Myoview 01/15/11:  Impression  Exercise Capacity: Fair exercise capacity.  BP Response: Normal blood pressure response.  Clinical Symptoms: No chest pain.  ECG Impression: No significant ST segment change suggestive of ischemia.  Comparison with Prior Nuclear Study: No images to compare   Overall Impression: Low risk stress nuclear study with a small, partially reversible apical defect most likely related to apical thinning; cannot R/O very mild apical ischemia.  EF: 75%   ASSESSMENT AND PLAN:

## 2011-03-02 ENCOUNTER — Ambulatory Visit (INDEPENDENT_AMBULATORY_CARE_PROVIDER_SITE_OTHER): Payer: Medicare Other | Admitting: Cardiology

## 2011-03-02 ENCOUNTER — Other Ambulatory Visit: Payer: Medicare Other | Admitting: *Deleted

## 2011-03-02 ENCOUNTER — Other Ambulatory Visit: Payer: Self-pay | Admitting: Family Medicine

## 2011-03-02 ENCOUNTER — Encounter: Payer: Self-pay | Admitting: *Deleted

## 2011-03-02 DIAGNOSIS — R079 Chest pain, unspecified: Secondary | ICD-10-CM | POA: Diagnosis not present

## 2011-03-02 DIAGNOSIS — E785 Hyperlipidemia, unspecified: Secondary | ICD-10-CM

## 2011-03-02 DIAGNOSIS — I6529 Occlusion and stenosis of unspecified carotid artery: Secondary | ICD-10-CM

## 2011-03-02 NOTE — Assessment & Plan Note (Signed)
She has had no further chest pain. Stress test is as listed. She will continue with aggressive risk reduction.

## 2011-03-02 NOTE — Progress Notes (Signed)
HPI .Patient returns for followup of chest discomfort. She had this with an emergency room visit in November. However, followup stress testing demonstrated perhaps apical thinning versus ischemia but a very well preserved ejection fraction. Her symptoms were atypical and responded to Prilosec. Since being treated with this she's had no chest pressure, neck or arm discomfort. She denies any shortness of breath, PND or orthopnea. She tries to keep active and she exercises occasionally without bringing on the symptoms.  No Known Allergies  Current Outpatient Prescriptions  Medication Sig Dispense Refill  . aspirin EC 81 MG tablet Take 81 mg by mouth at bedtime.        Marland Kitchen CALCIUM-VITAMIN D PO Take 600 Units by mouth 2 (two) times daily.        . cholecalciferol (VITAMIN D-400) 400 UNITS TABS Take 800 Units by mouth daily.        . furosemide (LASIX) 20 MG tablet Take 20 mg by mouth daily.        . Multiple Vitamin (MULTIVITAMIN PO) Take 1 tablet by mouth at bedtime.        Marland Kitchen omega-3 acid ethyl esters (LOVAZA) 1 G capsule Take 1 g by mouth 3 (three) times daily.        Marland Kitchen omeprazole (PRILOSEC) 20 MG capsule Take 1 capsule (20 mg total) by mouth daily.  30 capsule  6  . pravastatin (PRAVACHOL) 20 MG tablet Take 20 mg by mouth daily.          Past Medical History  Diagnosis Date  . DVT (deep venous thrombosis)     hx of  . Hyperlipidemia   . PE (pulmonary embolism)     hx of  . Hyperglycemia   . UTI (lower urinary tract infection)     hx  . Ectopic pregnancy with intrauterine pregnancy   . Chest pain     CLite with apical ischemia in 2006 - normal coronary arteries by [facility] in 12/2004;  Myoview 11/12:  Low risk stress nuclear study with a small, partially reversible apical defect most likely related to apical thinning; cannot R/O very mild apical ischemia.  EF: 75%   . Hiatal hernia   . CAD (coronary artery disease)     Past Surgical History  Procedure Date  . Foot surgery   . Abdominal  exploration surgery   . Cortisone shot   . Ectopic pregnancy surgery   . Bunionectomy     right    ROS:  As stated in the HPI and negative for all other systems.  PHYSICAL EXAM BP 110/63  Pulse 66  Ht 5\' 2"  (1.575 m)  Wt 171 lb (77.565 kg)  BMI 31.28 kg/m2 GENERAL:  Well appearing HEENT:  Pupils equal round and reactive, fundi not visualized, oral mucosa unremarkable NECK:  No jugular venous distention, waveform within normal limits, carotid upstroke brisk and symmetric, no bruits, no thyromegaly LYMPHATICS:  No cervical, inguinal adenopathy LUNGS:  Clear to auscultation bilaterally BACK:  No CVA tenderness CHEST:  Unremarkable HEART:  PMI not displaced or sustained,S1 and S2 within normal limits, no S3, no S4, no clicks, no rubs, no murmurs ABD:  Flat, positive bowel sounds normal in frequency in pitch, no bruits, no rebound, no guarding, no midline pulsatile mass, no hepatomegaly, no splenomegaly EXT:  2 plus pulses throughout, no edema, no cyanosis no clubbing SKIN:  No rashes no nodules NEURO:  Cranial nerves II through XII grossly intact, motor grossly intact throughout PSYCH:  Cognitively  intact, oriented to person place and time  Lab Results  Component Value Date   CHOL 306* 11/22/2010   HDL 84.40 11/22/2010   LDLDIRECT 203.1 11/22/2010   TRIG 100.0 11/22/2010   CHOLHDL 4 11/22/2010    ASSESSMENT AND PLAN

## 2011-03-02 NOTE — Assessment & Plan Note (Signed)
She had mild plaque in 2010.  With her cholesterol I will follow up on this.

## 2011-03-02 NOTE — Patient Instructions (Addendum)
Please have blood work today (lipid)  The current medical regimen is effective;  continue present plan and medications.  Your physician has requested that you have a carotid duplex. This test is an ultrasound of the carotid arteries in your neck. It looks at blood flow through these arteries that supply the brain with blood. Allow one hour for this exam. There are no restrictions or special instructions.  Follow up as needed

## 2011-03-02 NOTE — Assessment & Plan Note (Signed)
Her lipids are listed above. I doubt the 20 mg of pravastatin will have made it didn't in this. I will check the lipids again today. I'll make a decision regarding further make changes based on this. She might benefit from our lipid clinic. On the positive side she does have an excellent HDL.

## 2011-03-07 NOTE — Progress Notes (Signed)
Addended by: Sharin Grave on: 03/07/2011 05:19 PM   Modules accepted: Orders

## 2011-03-09 ENCOUNTER — Ambulatory Visit: Payer: Medicare Other | Admitting: Internal Medicine

## 2011-03-19 ENCOUNTER — Encounter (INDEPENDENT_AMBULATORY_CARE_PROVIDER_SITE_OTHER): Payer: Medicare Other | Admitting: *Deleted

## 2011-03-19 DIAGNOSIS — I6529 Occlusion and stenosis of unspecified carotid artery: Secondary | ICD-10-CM | POA: Diagnosis not present

## 2011-03-19 DIAGNOSIS — R079 Chest pain, unspecified: Secondary | ICD-10-CM

## 2011-03-23 ENCOUNTER — Other Ambulatory Visit: Payer: Self-pay | Admitting: *Deleted

## 2011-03-23 DIAGNOSIS — E785 Hyperlipidemia, unspecified: Secondary | ICD-10-CM

## 2011-04-02 ENCOUNTER — Ambulatory Visit (INDEPENDENT_AMBULATORY_CARE_PROVIDER_SITE_OTHER): Payer: Medicare Other | Admitting: Pharmacist

## 2011-04-02 VITALS — Wt 171.0 lb

## 2011-04-02 DIAGNOSIS — E785 Hyperlipidemia, unspecified: Secondary | ICD-10-CM | POA: Diagnosis not present

## 2011-04-02 NOTE — Patient Instructions (Addendum)
Stop pravastatin   Start Crestor 10mg  daily.  If you have any problems, please call the office at [phone] and ask for Kennon Rounds   Try to eat more fresh fruits and vegetables.  Cut down on [place] food.  Try to start drinking more water rather than flavored drinks.  For breakfast, try cereals, cottage cheese or oatmeal with fruit.  Stop ice cream at night.   Start exercising on your treadmill 30 minutes 4-5 times per week.

## 2011-04-03 NOTE — Progress Notes (Signed)
HPI  Ms. Boer is a 71 yo female presenting for a lipid visit.  Her PMH is significant for CAD.  She has taken Lipitor in the past while she was still working (~15 years ago).  She thinks she might have experienced muscle pains, but she cannot remember.  She is currently taking pravastatin 20 mg daily and Lovaza 1 g up three times a day.  FH: MI - CAD at 55 years old in mother.  Sister died of unknown cause at 9 years old.  SH: Patient has been retired for the last 15 years.  Diet:  She eats only two times a day.  She does not eat breakfast, but will eat her first meal of the day usually between 12 and 2 pm. For this meal, she may do 2 eggs, 2 slices of bacon, applesauce, or oatmeal with bacon. For her second meal of the day, or dinner, she may do a [employer] salad or pork chop or fried chicken with steamed veggies.  Other nights, she may do [place] food or go to [employer] where she gets liver and onions, or chicken and dumplings with one of the following sides: turnips/collards/cabbage/mac and cheese.   Patient consumes about 2 glasses of 2% milk per day.  Her drink of choice is flavored water (like Kool-Aid), and she may have V8 juice sometimes. For snacks, she eats ice cream topped with pecans, or cookies with milk in the evening.   Exercise: she has not done any exercise in a few weeks, but she used to go on the treadmill for about 30 minutes a day for 4-5 days of the week.  Current medication: Current Outpatient Prescriptions  Medication Sig Dispense Refill  . aspirin EC 81 MG tablet Take 81 mg by mouth at bedtime.        Marland Kitchen CALCIUM-VITAMIN D PO Take 600 Units by mouth 2 (two) times daily.        . cholecalciferol (VITAMIN D-400) 400 UNITS TABS Take 800 Units by mouth daily.        . furosemide (LASIX) 20 MG tablet Take 20 mg by mouth daily.        . Multiple Vitamin (MULTIVITAMIN PO) Take 1 tablet by mouth at bedtime.        Marland Kitchen omeprazole (PRILOSEC) 20 MG capsule Take 1 capsule (20 mg total) by  mouth daily.  30 capsule  6  . pravastatin (PRAVACHOL) 20 MG tablet Take 20 mg by mouth daily.        Marland Kitchen omega-3 acid ethyl esters (LOVAZA) 1 G capsule Take 1 g by mouth 3 (three) times daily.         Allergies: No Known Allergies

## 2011-04-03 NOTE — Progress Notes (Deleted)
HPI  Sierra Ford is a 71 yo female presenting for a lipids visit.  Her PMH is significant for CAD, diastolic dysfunction, unspecified chest pain, abnormal stress test, right leg edema, fasting hyperglycemia, and hx of VTE.  Diet:  She eats only two times a day.  She does not eat breakfast, but will eat her first meal of the day usually between 12 and 2 pm. For this meal, she may do 2 eggs, 2 slices of bacon, applesauce, or oatmeal with bacon. For her second meal of the day, or dinner, she may do a [employer] salad or pork chop or fried chicken with steamed veggies.  Other nights, she may do [place] food or go to [employer] where she gets liver and onion, or chicken and dumplings with one of the following sides: turnips/collards/cabbage/mac and cheese.   Snacks: she eats ice cream topped with pecans, or cookies with milk in the evening.  Patient consumes about 2 glasses of 2% milk per day.  Her drink of choice is flavored water (like Kool-Aid), and she may have V8 juice sometimes.  Exercise: she has not done any exercise in a few weeks, but she used to go on the treadmill for about 30 minutes a day for 4-5 days of the week.  She has taken Lipitor in the past while she was still working.  FH: MI - CAD at 16 years old in mother.  Sister died of unknown cause at 6 years old.  SH: Patient has been retired for the last 15 years.  Current medication: Current Outpatient Prescriptions  Medication Sig Dispense Refill  . aspirin EC 81 MG tablet Take 81 mg by mouth at bedtime.        Marland Kitchen CALCIUM-VITAMIN D PO Take 600 Units by mouth 2 (two) times daily.        . cholecalciferol (VITAMIN D-400) 400 UNITS TABS Take 800 Units by mouth daily.        . furosemide (LASIX) 20 MG tablet Take 20 mg by mouth daily.        . Multiple Vitamin (MULTIVITAMIN PO) Take 1 tablet by mouth at bedtime.        Marland Kitchen omeprazole (PRILOSEC) 20 MG capsule Take 1 capsule (20 mg total) by mouth daily.  30 capsule  6  . pravastatin (PRAVACHOL)  20 MG tablet Take 20 mg by mouth daily.        Marland Kitchen omega-3 acid ethyl esters (LOVAZA) 1 G capsule Take 1 g by mouth 3 (three) times daily.         Allergies: No Known Allergies

## 2011-04-03 NOTE — Assessment & Plan Note (Addendum)
Assessment:  LDL uncontrolled (goal <100 mg/dl).  Triglycerides and HDL are at goal.  Goal is to encourage lifestyle changes, adherence to medications, and prevention of cardiovascular events.  Labs TC 308, TG 70, HDL 92.3, LDL 197 mg/dl.  LFTs WNL  Plan:  1.) Stop pravastatin.  Start Crestor 10mg  daily.  Gave patient samples.  Discussed side effects including muscle aches.    2.)  Encourage lifestyle changes:  Try to eat more fresh fruits and vegetables.  Cut down on [place] food.  Try to start drinking more water rather than flavored drinks.  For breakfast, try cereals, cottage cheese or oatmeal with fruit.  Stop ice cream at night.  Start exercising on the treadmill 30 minutes 4-5 times per week.    3.) Follow up in about 6 weeks with a fasting lipid panel and another lipids visit.

## 2011-04-04 MED ORDER — ROSUVASTATIN CALCIUM 10 MG PO TABS: 10.0000 mg | ORAL_TABLET | Freq: Every day | ORAL | Status: DC

## 2011-05-02 ENCOUNTER — Other Ambulatory Visit: Payer: Medicare Other

## 2011-05-03 ENCOUNTER — Other Ambulatory Visit (INDEPENDENT_AMBULATORY_CARE_PROVIDER_SITE_OTHER): Payer: Medicare Other

## 2011-05-03 ENCOUNTER — Ambulatory Visit: Payer: Medicare Other

## 2011-05-03 DIAGNOSIS — E785 Hyperlipidemia, unspecified: Secondary | ICD-10-CM | POA: Diagnosis not present

## 2011-05-03 LAB — HEPATIC FUNCTION PANEL
AST: 26 U/L (ref 0–37)
Albumin: 4.2 g/dL (ref 3.5–5.2)

## 2011-05-03 LAB — LIPID PANEL
Cholesterol: 240 mg/dL — ABNORMAL HIGH (ref 0–200)
HDL: 93.5 mg/dL (ref >39.00)
Total CHOL/HDL Ratio: 3
Triglycerides: 73 mg/dL (ref 0.0–149.0)
VLDL: 14.6 mg/dL (ref 0.0–40.0)

## 2011-05-03 LAB — LDL CHOLESTEROL, DIRECT: Direct LDL: 126.2 mg/dL

## 2011-05-07 ENCOUNTER — Ambulatory Visit (INDEPENDENT_AMBULATORY_CARE_PROVIDER_SITE_OTHER): Payer: Medicare Other | Admitting: Pharmacist

## 2011-05-07 ENCOUNTER — Ambulatory Visit: Payer: Medicare Other

## 2011-05-07 VITALS — BP 140/80 | Wt 172.4 lb

## 2011-05-07 DIAGNOSIS — E785 Hyperlipidemia, unspecified: Secondary | ICD-10-CM

## 2011-05-07 MED ORDER — ROSUVASTATIN CALCIUM 10 MG PO TABS: 10.0000 mg | ORAL_TABLET | Freq: Every day | ORAL | Status: DC

## 2011-05-07 NOTE — Assessment & Plan Note (Addendum)
Assessment:  LDL uncontrolled (goal <70 mg/dl).  Triglycerides and HDL are at goal.  Goal is to encourage lifestyle changes and prevent cardiovascular events. Goals: TC<200, LDL<70 Labs TC 240, TG 73, HDL 93.5, LDL 126.2 mg/dl.  LFTs WNL  Plan:  1. Continue Crestor 10mg  daily. Gave patient samples and discount card.   2. Encourage lifestyle changes: Continue changing diet steadily and exercising on the treadmill 2-3 times/week.    3. Sent Crestor 10mg  prescription to [employer] [facility].  4. Follow up in 3 months with a fasting lipid panel and another lipids visit.

## 2011-05-07 NOTE — Patient Instructions (Signed)
Good job choosing healthy food choices and increasing your exercise!  1) Continue Crestor 10mg  daily 2) Continue to choose healthy food options and limiting chinese food and fast food. 3) Try to work up to 30 minutes of exercise 4-5 times per week.  Follow-up office visit and blood work in 3 months. We will call you to schedule an appointment.

## 2011-05-07 NOTE — Progress Notes (Signed)
HPI  Sierra Ford is a pleasant 71 yo female presenting for a lipid visit.  Her PMH is significant for CAD.  She has been taking Crestor 10mg  QDay and is tolerating it well. Previously she tried Lipitor and pravastatin, but can't remember if she had muscle pains with them. She is no longer taking Lovaza 1 g Qday.  FH: MI - CAD at 32 years old in mother.  Sister died of unknown cause at 72 years old.  SH: Patient has been retired for the last 15 years.  Diet:  She eats only two times a day.  She does not eat breakfast, but will eat her first meal of the day usually between 12 and 2 pm. For this meal, she eats oatmeal or cereal and applesauce. She discovered egg whites and plans on trying them soon. She often has waffles with syrup and sometimes has bacon. For her second meal of the day, or dinner, she has [employer] salad, [employer], or [place] food. She has decreased [place] food from once a week to once a month. At [employer] she chooses baked chicken and 2 vegetable sides more often than liver and onions with mac and cheese as a side. She has decreased her carb intake. She now eats more fruit: pineapples, watermelon, strawberries, apples, and bananas. She continues to have about 2 glasses of 2% milk per day. On days where she feels she hasn't had enough servings of fruits/vegetables, she drinks a glass of V8 juice. She has increased water intake to 2-3 glasses/day.  She has decreased her ice cream snacking in the evening.   Exercise: She has increased exercise to 2 days of treadmill a week.  Current medication: Current Outpatient Prescriptions on File Prior to Visit  Medication Sig Dispense Refill  . aspirin EC 81 MG tablet Take 81 mg by mouth at bedtime.        Marland Kitchen CALCIUM-VITAMIN D PO Take 600 Units by mouth 2 (two) times daily.        . cholecalciferol (VITAMIN D-400) 400 UNITS TABS Take 800 Units by mouth daily.        . furosemide (LASIX) 20 MG tablet Take 20 mg by mouth daily.        . Multiple Vitamin  (MULTIVITAMIN PO) Take 1 tablet by mouth at bedtime.        Marland Kitchen omeprazole (PRILOSEC) 20 MG capsule Take 1 capsule (20 mg total) by mouth daily.  30 capsule  6  . DISCONTD: rosuvastatin (CRESTOR) 10 MG tablet Take 1 tablet (10 mg total) by mouth daily.  30 tablet  0    Allergies: No Known Allergies

## 2011-05-08 ENCOUNTER — Encounter: Payer: Self-pay | Admitting: Internal Medicine

## 2011-05-08 ENCOUNTER — Ambulatory Visit (INDEPENDENT_AMBULATORY_CARE_PROVIDER_SITE_OTHER): Payer: Medicare Other | Admitting: Internal Medicine

## 2011-05-08 VITALS — BP 108/70 | HR 83 | Ht 64.0 in | Wt 168.2 lb

## 2011-05-08 DIAGNOSIS — R079 Chest pain, unspecified: Secondary | ICD-10-CM

## 2011-05-08 DIAGNOSIS — K3189 Other diseases of stomach and duodenum: Secondary | ICD-10-CM

## 2011-05-08 DIAGNOSIS — K219 Gastro-esophageal reflux disease without esophagitis: Secondary | ICD-10-CM | POA: Diagnosis not present

## 2011-05-08 DIAGNOSIS — R1013 Epigastric pain: Secondary | ICD-10-CM | POA: Diagnosis not present

## 2011-05-08 DIAGNOSIS — R0789 Other chest pain: Secondary | ICD-10-CM

## 2011-05-08 DIAGNOSIS — K449 Diaphragmatic hernia without obstruction or gangrene: Secondary | ICD-10-CM

## 2011-05-08 MED ORDER — OMEPRAZOLE 20 MG PO CPDR: 20.0000 mg | DELAYED_RELEASE_CAPSULE | Freq: Every day | ORAL | Status: DC

## 2011-05-08 NOTE — Progress Notes (Signed)
Sierra Ford [birth year] MRN [id number]  History of Present Illness:  This is a 71 year old African American female with noncardiac chest pain who was seen in the emergency room last November 2012. Her Myoview stress test was normal with an ejection fraction of 75%. Her symptoms responded to Prilosec 20 mg daily. She currently has no complaints. She denies dysphagia. She was having food regurgitation at night after she was snacking at night. We did a screening colonoscopy in 2000 and again in January 2009 for a family history of colon polyps. Her exam was normal. Her liver function tests were normal and her hemoglobin 14.1. There is no family history of gallbladder disease.   Past Medical History  Diagnosis Date  . DVT (deep venous thrombosis)     hx of  . Hyperlipidemia   . PE (pulmonary embolism)     hx of  . Hyperglycemia   . UTI (lower urinary tract infection)     hx  . Ectopic pregnancy with intrauterine pregnancy   . Chest pain     CLite with apical ischemia in 2006 - normal coronary arteries by [facility] in 12/2004;  Myoview 11/12:  Low risk stress nuclear study with a small, partially reversible apical defect most likely related to apical thinning; cannot R/O very mild apical ischemia.  EF: 75%   . Hiatal hernia   . CAD (coronary artery disease)    Past Surgical History  Procedure Date  . Foot surgery   . Abdominal exploration surgery   . Cortisone shot   . Ectopic pregnancy surgery   . Bunionectomy     right    reports that she quit smoking about 17 months ago. She has never used smokeless tobacco. She reports that she does not drink alcohol or use illicit drugs. family history includes Colon polyps in her mother; Diabetes in her maternal aunt; and Heart failure in her mother. No Known Allergies      Review of Systems: Denies dysphagia. Odynophagia.  The remainder of the 10 point ROS is negative except as outlined in H&P   Physical Exam: General appearance  Well  developed, in no distress. Eyes- non icteric. HEENT nontraumatic, normocephalic. Mouth no lesions, tongue papillated, no cheilosis. Neck supple without adenopathy, thyroid not enlarged, no carotid bruits, no JVD. Lungs Clear to auscultation bilaterally. Cor normal S1, normal S2, regular rhythm, no murmur,  quiet precordium. Abdomen: Soft nontender. Normoactive bowel sounds. No distention. Liver edge at costal margin. Rectal: Not done. Extremities no pedal edema. Skin no lesions. Neurological alert and oriented x 3. Psychological normal mood and affect.  Assessment and Plan:  Problem #1 Noncardiac chest pain which responded to  Prilosec. Symptoms were present only for a short period of time and responded to acid reducers. We will taper her Prilosec. If her symptoms keep recurring, we will proceed with an upper endoscopy and/or upper abdominal ultrasound.   Problem #2 Colorectal screening. Patient's last colonoscopy in 2009 was normal. A recall colonoscopy will be due in January 2019.  05/08/2011 Lina Sar

## 2011-05-08 NOTE — Patient Instructions (Addendum)
We have sent the following medications to your pharmacy for you to pick up at your convenience: Prilosec 20 mg Continue taking your medication and call our office should your symptoms persist. Follow antireflux measures. CC: Dr Laury Axon

## 2011-07-03 ENCOUNTER — Other Ambulatory Visit: Payer: Self-pay | Admitting: Pharmacist

## 2011-07-03 DIAGNOSIS — E785 Hyperlipidemia, unspecified: Secondary | ICD-10-CM

## 2011-07-30 ENCOUNTER — Encounter (INDEPENDENT_AMBULATORY_CARE_PROVIDER_SITE_OTHER): Payer: Medicare Other | Admitting: *Deleted

## 2011-07-30 DIAGNOSIS — E785 Hyperlipidemia, unspecified: Secondary | ICD-10-CM | POA: Diagnosis not present

## 2011-07-30 LAB — LIPID PANEL
LDL Cholesterol: 74 mg/dL (ref 0–99)
Total CHOL/HDL Ratio: 3
Triglycerides: 71 mg/dL (ref 0.0–149.0)

## 2011-07-30 LAB — HEPATIC FUNCTION PANEL
Albumin: 4 g/dL (ref 3.5–5.2)
Alkaline Phosphatase: 71 U/L (ref 39–117)
Bilirubin, Direct: 0 mg/dL (ref 0.0–0.3)

## 2011-07-31 ENCOUNTER — Ambulatory Visit (INDEPENDENT_AMBULATORY_CARE_PROVIDER_SITE_OTHER): Payer: Medicare Other | Admitting: Pharmacist

## 2011-07-31 ENCOUNTER — Encounter: Payer: Self-pay | Admitting: Pharmacist

## 2011-07-31 VITALS — BP 122/72 | Wt 174.0 lb

## 2011-07-31 DIAGNOSIS — E785 Hyperlipidemia, unspecified: Secondary | ICD-10-CM | POA: Diagnosis not present

## 2011-07-31 NOTE — Patient Instructions (Signed)
Congratulations on getting your cholesterol down to goal!  Continue to limit the amount of ice cream and milkshakes you enjoy.  Continue to increase your intake of vegetables and fruits.  Try switching to 1% milk.  Continue exercising 150 minutes/week.  Next fasting labwork: Wednesday, 12/11 Next office visit: Thursday, 12/12 at 10am

## 2011-07-31 NOTE — Progress Notes (Signed)
HPI: Sierra Ford arrived today in good spirits for a 3 month follow up of her lipids.  Her PMH lists CAD, but she does not recall ever having an event of stents placed.  She continues on Crestor 10mg  daily without complaints and with good adherence.    Diet:  She admits to "being bad" with watching what she eats, and admits that her biggest weakness is ice cream and milkshakes.  She eats only two times a day, and has never like to eat breakfast.  She has cut back on chinese and fast foods since her last visit.  Exercise: She continues to walk on her treadmill for 30 minutes 4-5 days/week and is trying to increase the speed intensity.  Lipid Panel     Component Value Date/Time   CHOL 142 07/30/2011 0850   TRIG 71.0 07/30/2011 0850   HDL 53.70 07/30/2011 0850   CHOLHDL 3 07/30/2011 0850   VLDL 14.2 07/30/2011 0850   LDLCALC 74 07/30/2011 0850   Current medication: Current Outpatient Prescriptions on File Prior to Visit  Medication Sig Dispense Refill  . aspirin EC 81 MG tablet Take 81 mg by mouth at bedtime.        Marland Kitchen CALCIUM-VITAMIN D PO Take 600 Units by mouth 2 (two) times daily.        . cholecalciferol (VITAMIN D-400) 400 UNITS TABS Take 800 Units by mouth daily.        . furosemide (LASIX) 20 MG tablet Take 20 mg by mouth daily.       . Multiple Vitamin (MULTIVITAMIN PO) Take 1 tablet by mouth at bedtime.        Marland Kitchen omeprazole (PRILOSEC) 20 MG capsule Take 1 capsule (20 mg total) by mouth daily.  30 capsule  3  . rosuvastatin (CRESTOR) 10 MG tablet Take 1 tablet (10 mg total) by mouth daily.  30 tablet  6  . DISCONTD: omega-3 acid ethyl esters (LOVAZA) 1 G capsule Take 1 g by mouth 3 (three) times daily.        Marland Kitchen DISCONTD: pravastatin (PRAVACHOL) 20 MG tablet Take 20 mg by mouth daily.

## 2011-07-31 NOTE — Progress Notes (Deleted)
Opened in error

## 2011-07-31 NOTE — Assessment & Plan Note (Addendum)
CV Risk Assessment: Risk Factors: CAD (?), age Positive Factors: high HDL  All of her lipid parameters have improved and are now at or below goal.  Plan: -Continue taking Crestor 10mg  daily -Continue to limit the amount of ice cream and milkshakes you enjoy. -Continue to increase your intake of vegetables and fruits. -Try switching to 1% milk. -Continue exercising 150 minutes/week.  Next fasting labwork: Wednesday, 12/11 Next office visit: Thursday, 12/12 at 10am

## 2011-08-01 NOTE — Patient Instructions (Addendum)
Opened in error

## 2011-08-10 DIAGNOSIS — H6120 Impacted cerumen, unspecified ear: Secondary | ICD-10-CM | POA: Diagnosis not present

## 2011-08-10 DIAGNOSIS — H902 Conductive hearing loss, unspecified: Secondary | ICD-10-CM | POA: Diagnosis not present

## 2011-12-03 ENCOUNTER — Other Ambulatory Visit: Payer: Self-pay | Admitting: Physician Assistant

## 2011-12-12 DIAGNOSIS — Z01419 Encounter for gynecological examination (general) (routine) without abnormal findings: Secondary | ICD-10-CM | POA: Diagnosis not present

## 2011-12-12 DIAGNOSIS — Z1231 Encounter for screening mammogram for malignant neoplasm of breast: Secondary | ICD-10-CM | POA: Diagnosis not present

## 2011-12-12 DIAGNOSIS — Z13820 Encounter for screening for osteoporosis: Secondary | ICD-10-CM | POA: Diagnosis not present

## 2011-12-12 DIAGNOSIS — Z1212 Encounter for screening for malignant neoplasm of rectum: Secondary | ICD-10-CM | POA: Diagnosis not present

## 2011-12-12 DIAGNOSIS — Z130 Encounter for screening for diseases of the blood and blood-forming organs and certain disorders involving the immune mechanism: Secondary | ICD-10-CM | POA: Diagnosis not present

## 2011-12-18 ENCOUNTER — Other Ambulatory Visit: Payer: Self-pay | Admitting: Obstetrics & Gynecology

## 2011-12-18 DIAGNOSIS — R928 Other abnormal and inconclusive findings on diagnostic imaging of breast: Secondary | ICD-10-CM

## 2011-12-20 ENCOUNTER — Ambulatory Visit
Admission: RE | Admit: 2011-12-20 | Discharge: 2011-12-20 | Disposition: A | Discharge status: Home / Self Care | Payer: Medicare Other | Source: Ambulatory Visit | Attending: Obstetrics & Gynecology | Admitting: Obstetrics & Gynecology

## 2011-12-20 ENCOUNTER — Other Ambulatory Visit: Payer: Self-pay | Admitting: Obstetrics & Gynecology

## 2011-12-20 DIAGNOSIS — R920 Mammographic microcalcification found on diagnostic imaging of breast: Secondary | ICD-10-CM | POA: Diagnosis not present

## 2011-12-20 DIAGNOSIS — R928 Other abnormal and inconclusive findings on diagnostic imaging of breast: Secondary | ICD-10-CM

## 2011-12-31 ENCOUNTER — Other Ambulatory Visit: Payer: Self-pay | Admitting: Obstetrics & Gynecology

## 2011-12-31 ENCOUNTER — Ambulatory Visit
Admission: RE | Admit: 2011-12-31 | Discharge: 2011-12-31 | Disposition: A | Discharge status: Home / Self Care | Payer: Medicare Other | Source: Ambulatory Visit | Attending: Obstetrics & Gynecology | Admitting: Obstetrics & Gynecology

## 2011-12-31 DIAGNOSIS — R928 Other abnormal and inconclusive findings on diagnostic imaging of breast: Secondary | ICD-10-CM

## 2012-01-02 ENCOUNTER — Other Ambulatory Visit: Payer: Self-pay | Admitting: Family Medicine

## 2012-01-02 NOTE — Telephone Encounter (Signed)
OV 11/22/10. Not showing Crestor on current med list    Plz advise    MW

## 2012-01-08 ENCOUNTER — Ambulatory Visit
Admission: RE | Admit: 2012-01-08 | Discharge: 2012-01-08 | Disposition: A | Discharge status: Home / Self Care | Payer: Medicare Other | Source: Ambulatory Visit | Attending: Obstetrics & Gynecology | Admitting: Obstetrics & Gynecology

## 2012-01-08 ENCOUNTER — Other Ambulatory Visit: Payer: Self-pay | Admitting: Obstetrics & Gynecology

## 2012-01-08 DIAGNOSIS — R928 Other abnormal and inconclusive findings on diagnostic imaging of breast: Secondary | ICD-10-CM

## 2012-01-08 DIAGNOSIS — N6019 Diffuse cystic mastopathy of unspecified breast: Secondary | ICD-10-CM | POA: Diagnosis not present

## 2012-01-08 DIAGNOSIS — N641 Fat necrosis of breast: Secondary | ICD-10-CM | POA: Diagnosis not present

## 2012-01-30 ENCOUNTER — Other Ambulatory Visit (INDEPENDENT_AMBULATORY_CARE_PROVIDER_SITE_OTHER): Payer: Medicare Other

## 2012-01-30 DIAGNOSIS — E785 Hyperlipidemia, unspecified: Secondary | ICD-10-CM | POA: Diagnosis not present

## 2012-01-30 LAB — LIPID PANEL
Cholesterol: 230 mg/dL — ABNORMAL HIGH (ref 0–200)
Total CHOL/HDL Ratio: 3

## 2012-01-30 LAB — HEPATIC FUNCTION PANEL
ALT: 26 U/L (ref 0–35)
AST: 27 U/L (ref 0–37)
Alkaline Phosphatase: 106 U/L (ref 39–117)
Total Bilirubin: 0.7 mg/dL (ref 0.3–1.2)

## 2012-01-31 ENCOUNTER — Ambulatory Visit (INDEPENDENT_AMBULATORY_CARE_PROVIDER_SITE_OTHER): Payer: Medicare Other | Admitting: Pharmacist

## 2012-01-31 VITALS — Wt 163.0 lb

## 2012-01-31 DIAGNOSIS — E785 Hyperlipidemia, unspecified: Secondary | ICD-10-CM

## 2012-01-31 NOTE — Patient Instructions (Addendum)
Increase Crestor to 10mg  daily except 20mg  on Monday, Wednesday and Friday.  Once you have settled down from the holidays, we will get back on your previous diet and exercise routine.  When you are on the treadmill, try to add a little bit of an incline and decrease your pace to help increase your heart rate more.   Recheck labs in 2-3 months.

## 2012-02-12 ENCOUNTER — Telehealth: Payer: Self-pay

## 2012-02-12 NOTE — Telephone Encounter (Signed)
Message copied by Maurice Small on Tue Feb 12, 2012  1:08 PM ------      Message from: Lelon Perla      Created: Sat Feb 09, 2012  5:14 PM       Cholesterol--- LDL goal < 100,  HDL >40,  TG < 150.  Diet and exercise will increase HDL and decrease LDL and TG.  Fish,  Fish Oil, Flaxseed oil will also help increase the HDL and decrease Triglycerides.   Recheck labs in 3 months-----increase crestor to 20 mg  #30 2 refills.272.4  Lipid, hep

## 2012-02-12 NOTE — Telephone Encounter (Signed)
Spoke with patient, patient's cholesterol is addressed by the lipid clinc. Patient was recently change to 10 mg daily EXCEPT 20 mg on M/W/F and patient will f/u with Lipid Clinic for recheck

## 2012-03-17 NOTE — Progress Notes (Signed)
HPI: [name] arrived today in good spirits for a 6 month follow up of her lipids.  Her PMH lists CAD, but she does not recall ever having an event of stents placed.  She continues on Crestor 10mg  daily without complaints and with good adherence.    Diet:  She admits to being off her diet for the holidays.  She has been eating more fast food, but trying to make decent choices such as [employer] or a [employer] salad.   Exercise: She continues to walk on her treadmill for 30 minutes 4-5 days/week.  She will walk approximately 2-2 1/2 miles/hour but does not have any incline set.   Current medication: Current Outpatient Prescriptions on File Prior to Visit  Medication Sig Dispense Refill  . aspirin EC 81 MG tablet Take 81 mg by mouth at bedtime.        Marland Kitchen CALCIUM-VITAMIN D PO Take 600 Units by mouth 2 (two) times daily.        . cholecalciferol (VITAMIN D-400) 400 UNITS TABS Take 800 Units by mouth daily.        . furosemide (LASIX) 20 MG tablet Take 20 mg by mouth daily.       . Multiple Vitamin (MULTIVITAMIN PO) Take 1 tablet by mouth at bedtime.        Marland Kitchen omeprazole (PRILOSEC) 20 MG capsule TAKE ONE CAPSULE BY MOUTH EVERY DAY  30 capsule  5  . rosuvastatin (CRESTOR) 10 MG tablet       . [DISCONTINUED] omega-3 acid ethyl esters (LOVAZA) 1 G capsule Take 1 g by mouth 3 (three) times daily.        . [DISCONTINUED] pravastatin (PRAVACHOL) 20 MG tablet Take 20 mg by mouth daily.

## 2012-03-17 NOTE — Assessment & Plan Note (Addendum)
Pt's LDL increased since June.  TC- 230, TG- 75, HDL- 91, and LDL- 132.  LFTs are WNL.  Increase likely due to change in patients lifestyle habits.  Will increase Crestor to 20mg  3 days of the week and 10mg  4 days.  Will have her get back to her previous diet and exercise routine.  Follow up in 2-3 months.

## 2012-04-02 ENCOUNTER — Other Ambulatory Visit: Payer: Medicare Other

## 2012-04-03 ENCOUNTER — Ambulatory Visit: Payer: Medicare Other | Admitting: Pharmacist

## 2012-04-07 ENCOUNTER — Other Ambulatory Visit: Payer: Self-pay | Admitting: Pharmacist

## 2012-04-07 ENCOUNTER — Other Ambulatory Visit (INDEPENDENT_AMBULATORY_CARE_PROVIDER_SITE_OTHER): Payer: Medicare Other

## 2012-04-07 DIAGNOSIS — E7800 Pure hypercholesterolemia, unspecified: Secondary | ICD-10-CM

## 2012-04-07 LAB — LIPID PANEL
HDL: 82.3 mg/dL (ref >39.00)
Total CHOL/HDL Ratio: 2
VLDL: 14.8 mg/dL (ref 0.0–40.0)

## 2012-04-07 LAB — HEPATIC FUNCTION PANEL
Bilirubin, Direct: 0.2 mg/dL (ref 0.0–0.3)
Total Bilirubin: 1.1 mg/dL (ref 0.3–1.2)

## 2012-04-08 ENCOUNTER — Ambulatory Visit (INDEPENDENT_AMBULATORY_CARE_PROVIDER_SITE_OTHER): Payer: Medicare Other | Admitting: Pharmacist

## 2012-04-08 VITALS — Wt 168.0 lb

## 2012-04-08 DIAGNOSIS — E785 Hyperlipidemia, unspecified: Secondary | ICD-10-CM | POA: Diagnosis not present

## 2012-04-08 NOTE — Patient Instructions (Addendum)
Congratulations on your weight loss!!! 1.  Decrease Crestor 10mg  daily because of your leg/muscle pains 2.  Keep your new healthy diet!! 3.  Continue exercise - you can decrease to walking 1 time a day for vs walking 2 times a day for - whichever fits your schedule better 4.  Follow up visit in 3 months for Lipid Clinic appt May 22 Thur at 11 5.  Come for fasting blood work the day before appt

## 2012-04-17 NOTE — Assessment & Plan Note (Addendum)
Tc 230>177 (at goal < 200) Tg 75> 74 (at goal < 150) HDL 91> 82 ( at goal > 40) LDL  74> 80 ( at goal < 100) LFT wnl but does complain of increased leg muscle pain since increasing Crestor 20mg  MWF and 10mg  other days at last visit.  We decided that decreasing Crestor back to 10mg  daily was best option for Sierra Ford.  I have congratulated Sierra Ford on weight loss and encouraged Sierra Ford to keep up the healthy diet.  She can decrease Sierra Ford walking to 1 time day x102min if that fits Sierra Ford lifestyle better.   She is scheduled for a 3 month ROV to ensure compliance and that muscle pain has stopped with lower Crestor dose.

## 2012-04-17 NOTE — Progress Notes (Signed)
HPI: [name] arrived today in good spirits for a 3 month follow up of her lipids.  Her PMH lists CAD, carotid artery stenosis no CVA.  At last visit her Crestor was increased to 20mg  MWF and 10mg  other days.  Since then she has noticed increased muscle pains in her legs.  She has started a new diet and exercise routine.  She states an 8lb wt loss per home scale.  Diet:  She admits to being off her diet for the holidays. Since then she has not eaten any fried food, [place] food or ice cream.  1 week ago she implemented a strict diet she saw on Dr. Neil Crouch.   Breakfast - fruit, small bowl oatmeal, greem tea and OJ Lunch - 100 calorie [employer] prt shake Dinner - 5oz lean meat and 2 veggies   Exercise: She continues to walk on her treadmill for 30 minutes approx 1 mi with incline all the way up 4-5 days/week.  She does an afternoon exercise on TV with some leg lifts and squats.  She most days walks again for ( approx 1.21mi) with no incline in the evening.    Current medication: Current Outpatient Prescriptions on File Prior to Visit  Medication Sig Dispense Refill  . aspirin EC 81 MG tablet Take 81 mg by mouth at bedtime.        Marland Kitchen CALCIUM-VITAMIN D PO Take 600 Units by mouth 2 (two) times daily.        . cholecalciferol (VITAMIN D-400) 400 UNITS TABS Take 800 Units by mouth daily.        . furosemide (LASIX) 20 MG tablet Take 20 mg by mouth daily.       . Multiple Vitamin (MULTIVITAMIN PO) Take 1 tablet by mouth at bedtime.        Marland Kitchen omeprazole (PRILOSEC) 20 MG capsule TAKE ONE CAPSULE BY MOUTH EVERY DAY  30 capsule  5  . rosuvastatin (CRESTOR) 10 MG tablet       . [DISCONTINUED] omega-3 acid ethyl esters (LOVAZA) 1 G capsule Take 1 g by mouth 3 (three) times daily.        . [DISCONTINUED] pravastatin (PRAVACHOL) 20 MG tablet Take 20 mg by mouth daily.         No current facility-administered medications on file prior to visit.

## 2012-05-03 ENCOUNTER — Other Ambulatory Visit: Payer: Self-pay | Admitting: Family Medicine

## 2012-05-06 ENCOUNTER — Other Ambulatory Visit: Payer: Self-pay | Admitting: Family Medicine

## 2012-05-06 DIAGNOSIS — R609 Edema, unspecified: Secondary | ICD-10-CM

## 2012-05-06 NOTE — Telephone Encounter (Signed)
Refill for lasix sent to [facility]

## 2012-06-30 ENCOUNTER — Telehealth: Payer: Self-pay | Admitting: Pharmacist

## 2012-06-30 NOTE — Telephone Encounter (Signed)
New problem    Pt has question about her Crestor. Please call pt

## 2012-06-30 NOTE — Telephone Encounter (Signed)
Pt called to report the cost of her Crestor had increased from $18 to $42 per month.  She is concerned she cannot afford it.  Advised her to call the insurance company to determine why the cost had increased.  Will leave her some samples until she can determine the cause of the increase.

## 2012-07-01 ENCOUNTER — Emergency Department (HOSPITAL_COMMUNITY): Payer: Medicare Other

## 2012-07-01 ENCOUNTER — Encounter (HOSPITAL_COMMUNITY): Payer: Self-pay

## 2012-07-01 ENCOUNTER — Encounter: Payer: Self-pay | Admitting: Family Medicine

## 2012-07-01 ENCOUNTER — Ambulatory Visit (HOSPITAL_COMMUNITY)
Admission: RE | Admit: 2012-07-01 | Discharge: 2012-07-01 | Disposition: A | Discharge status: Home / Self Care | Payer: Medicare Other | Source: Ambulatory Visit | Attending: Family Medicine | Admitting: Family Medicine

## 2012-07-01 ENCOUNTER — Other Ambulatory Visit (INDEPENDENT_AMBULATORY_CARE_PROVIDER_SITE_OTHER): Payer: Medicare Other

## 2012-07-01 ENCOUNTER — Emergency Department (HOSPITAL_COMMUNITY)
Admission: EM | Admit: 2012-07-01 | Discharge: 2012-07-01 | Disposition: A | Discharge status: Home / Self Care | Payer: Medicare Other | Attending: Emergency Medicine | Admitting: Emergency Medicine

## 2012-07-01 ENCOUNTER — Ambulatory Visit (INDEPENDENT_AMBULATORY_CARE_PROVIDER_SITE_OTHER): Payer: Medicare Other | Admitting: Family Medicine

## 2012-07-01 VITALS — BP 116/82 | HR 80 | Temp 98.0°F | Wt 162.8 lb

## 2012-07-01 DIAGNOSIS — R748 Abnormal levels of other serum enzymes: Secondary | ICD-10-CM | POA: Diagnosis not present

## 2012-07-01 DIAGNOSIS — M7989 Other specified soft tissue disorders: Secondary | ICD-10-CM | POA: Insufficient documentation

## 2012-07-01 DIAGNOSIS — I2510 Atherosclerotic heart disease of native coronary artery without angina pectoris: Secondary | ICD-10-CM | POA: Insufficient documentation

## 2012-07-01 DIAGNOSIS — R609 Edema, unspecified: Secondary | ICD-10-CM

## 2012-07-01 DIAGNOSIS — Z79899 Other long term (current) drug therapy: Secondary | ICD-10-CM | POA: Diagnosis not present

## 2012-07-01 DIAGNOSIS — Z862 Personal history of diseases of the blood and blood-forming organs and certain disorders involving the immune mechanism: Secondary | ICD-10-CM | POA: Insufficient documentation

## 2012-07-01 DIAGNOSIS — Z7982 Long term (current) use of aspirin: Secondary | ICD-10-CM | POA: Diagnosis not present

## 2012-07-01 DIAGNOSIS — Z87891 Personal history of nicotine dependence: Secondary | ICD-10-CM | POA: Insufficient documentation

## 2012-07-01 DIAGNOSIS — R0602 Shortness of breath: Secondary | ICD-10-CM | POA: Diagnosis not present

## 2012-07-01 DIAGNOSIS — Z8742 Personal history of other diseases of the female genital tract: Secondary | ICD-10-CM | POA: Diagnosis not present

## 2012-07-01 DIAGNOSIS — R7989 Other specified abnormal findings of blood chemistry: Secondary | ICD-10-CM

## 2012-07-01 DIAGNOSIS — E785 Hyperlipidemia, unspecified: Secondary | ICD-10-CM

## 2012-07-01 DIAGNOSIS — Z8639 Personal history of other endocrine, nutritional and metabolic disease: Secondary | ICD-10-CM | POA: Insufficient documentation

## 2012-07-01 DIAGNOSIS — Z86711 Personal history of pulmonary embolism: Secondary | ICD-10-CM | POA: Diagnosis not present

## 2012-07-01 DIAGNOSIS — R791 Abnormal coagulation profile: Secondary | ICD-10-CM | POA: Insufficient documentation

## 2012-07-01 DIAGNOSIS — Z86718 Personal history of other venous thrombosis and embolism: Secondary | ICD-10-CM | POA: Diagnosis not present

## 2012-07-01 DIAGNOSIS — Z87440 Personal history of urinary (tract) infections: Secondary | ICD-10-CM | POA: Insufficient documentation

## 2012-07-01 DIAGNOSIS — Z8719 Personal history of other diseases of the digestive system: Secondary | ICD-10-CM | POA: Insufficient documentation

## 2012-07-01 LAB — BASIC METABOLIC PANEL
CO2: 26 mEq/L (ref 19–32)
Calcium: 10 mg/dL (ref 8.4–10.5)
Chloride: 107 mEq/L (ref 96–112)
Potassium: 4 mEq/L (ref 3.5–5.1)
Sodium: 141 mEq/L (ref 135–145)

## 2012-07-01 LAB — HEPATIC FUNCTION PANEL
AST: 25 U/L (ref 0–37)
Albumin: 4.2 g/dL (ref 3.5–5.2)
Albumin: 4.2 g/dL (ref 3.5–5.2)
Alkaline Phosphatase: 86 U/L (ref 39–117)
Total Protein: 7.4 g/dL (ref 6.0–8.3)

## 2012-07-01 LAB — CBC WITH DIFFERENTIAL/PLATELET
Basophils Relative: 0.5 % (ref 0.0–3.0)
Eosinophils Absolute: 0.2 10*3/uL (ref 0.0–0.7)
HCT: 44.3 % (ref 36.0–46.0)
Hemoglobin: 15 g/dL (ref 12.0–15.0)
Lymphocytes Relative: 45.6 % (ref 12.0–46.0)
Lymphs Abs: 2.4 10*3/uL (ref 0.7–4.0)
MCHC: 33.9 g/dL (ref 30.0–36.0)
MCV: 92.6 fl (ref 78.0–100.0)
Neutro Abs: 2.2 10*3/uL (ref 1.4–7.7)
RBC: 4.78 Mil/uL (ref 3.87–5.11)

## 2012-07-01 LAB — D-DIMER, QUANTITATIVE: D-Dimer, Quant: 0.51 ug/mL-FEU — ABNORMAL HIGH (ref 0.00–0.48)

## 2012-07-01 LAB — LIPID PANEL
Cholesterol: 225 mg/dL — ABNORMAL HIGH (ref 0–200)
HDL: 106.3 mg/dL (ref >39.00)
Total CHOL/HDL Ratio: 2
Triglycerides: 75 mg/dL (ref 0.0–149.0)
VLDL: 15 mg/dL (ref 0.0–40.0)

## 2012-07-01 LAB — LDL CHOLESTEROL, DIRECT: Direct LDL: 108.8 mg/dL

## 2012-07-01 MED ORDER — IOHEXOL 350 MG/ML SOLN
100.0000 mL | Freq: Once | INTRAVENOUS | Status: AC | PRN
Start: 2012-07-01 — End: 2012-07-01
  Administered 2012-07-01: 100 mL via INTRAVENOUS

## 2012-07-01 NOTE — ED Notes (Signed)
Pt sent to the ED to r/o a blood clot d/t BLE swelling, pt has increase swelling to her Right leg. Dr. Laury Axon did a bedside US of her RLE and did not see a blood clot. Pt reports she received a phone call at home this evening telling her to come to [facility] ED to r/o a blood clot d/t abnormal labs. This RN spoke with the dr on call and they reported pt had an elevated D-Dimmer. 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 Pt denies chest pain or SOB

## 2012-07-01 NOTE — Progress Notes (Signed)
Right lower extremity venous duplex completed.  Right:  No evidence of DVT, superficial thrombosis, or Baker's cyst.  Left:  Negative for DVT in the common femoral vein.  

## 2012-07-01 NOTE — ED Provider Notes (Signed)
History     CSN: [id number]  Arrival date & time 07/01/12  2021   First MD Initiated Contact with Patient 07/01/12 2058      Chief Complaint  Patient presents with  . Leg Swelling    (Consider location/radiation/quality/duration/timing/severity/associated sxs/prior treatment) HPI Pt with several weeks of lower ext swelling R>L and ongoing SOB. No chest pain, fever, chills, cough. Pt had labs drawn by PMD and scheduled [name] of RLE to r/o DVT. No DVT present. Pt was called by her PMD's office and told to go to the ED because of elevated d-dimer adn to r/o PE.  Past Medical History  Diagnosis Date  . DVT (deep venous thrombosis)     hx of  . Hyperlipidemia   . PE (pulmonary embolism)     hx of  . Hyperglycemia   . UTI (lower urinary tract infection)     hx  . Ectopic pregnancy with intrauterine pregnancy   . Chest pain     CLite with apical ischemia in 2006 - normal coronary arteries by [facility] in 12/2004;  Myoview 11/12:  Low risk stress nuclear study with a small, partially reversible apical defect most likely related to apical thinning; cannot R/O very mild apical ischemia.  EF: 75%   . Hiatal hernia   . CAD (coronary artery disease)     Past Surgical History  Procedure Laterality Date  . Foot surgery    . Abdominal exploration surgery    . Cortisone shot    . Ectopic pregnancy surgery    . Bunionectomy      right    Family History  Problem Relation Age of Onset  . Heart failure Mother     died from  . Colon polyps Mother   . Diabetes Maternal Aunt     grandaughter    History  Substance Use Topics  . Smoking status: Former Smoker -- 50 years    Quit date: 11/21/2009  . Smokeless tobacco: Never Used  . Alcohol Use: No    OB History   Grav Para Term Preterm Abortions TAB SAB Ect Mult Living                  Review of Systems  Constitutional: Negative for fever and chills.  HENT: Negative for neck pain.   Respiratory: Positive for shortness of breath.  Negative for cough and wheezing.   Cardiovascular: Negative for chest pain.  Gastrointestinal: Negative for nausea, vomiting and abdominal pain.  Skin: Negative for rash and wound.  Neurological: Negative for dizziness, weakness, numbness and headaches.  All other systems reviewed and are negative.    Allergies  Review of patient's allergies indicates no known allergies.  Home Medications   Current Outpatient Rx  Name  Route  Sig  Dispense  Refill  . aspirin EC 81 MG tablet   Oral   Take 81 mg by mouth at bedtime.           Marland Kitchen CALCIUM-VITAMIN D PO   Oral   Take 600 Units by mouth 2 (two) times daily.           . cholecalciferol (VITAMIN D-400) 400 UNITS TABS   Oral   Take 400 Units by mouth 2 (two) times daily.          . furosemide (LASIX) 20 MG tablet   Oral   Take 20 mg by mouth daily.         . Multiple Vitamin (MULTIVITAMIN PO)  Oral   Take 1 tablet by mouth at bedtime.           . rosuvastatin (CRESTOR) 10 MG tablet   Oral   Take 10 mg by mouth daily with supper.           BP 115/59  Pulse 74  Temp(Src) 98.2 F (36.8 C) (Oral)  Resp 23  SpO2 100%  Physical Exam  Nursing note and vitals reviewed. Constitutional: She is oriented to person, place, and time. She appears well-developed and well-nourished. No distress.  HENT:  Head: Normocephalic and atraumatic.  Mouth/Throat: Oropharynx is clear and moist.  Eyes: EOM are normal. Pupils are equal, round, and reactive to light.  Neck: Normal range of motion. Neck supple.  Cardiovascular: Normal rate and regular rhythm.   Pulmonary/Chest: Effort normal and breath sounds normal. No respiratory distress. She has no wheezes. She has no rales. She exhibits no tenderness.  Abdominal: Soft. Bowel sounds are normal. She exhibits no distension and no mass. There is no tenderness. There is no rebound and no guarding.  Musculoskeletal: Normal range of motion. She exhibits no edema and no tenderness.  RLE  swelling >LLE. No calf tenderness. No cords. 2+DP bl.   Neurological: She is alert and oriented to person, place, and time.  Moves all ext without deficit.   Skin: Skin is warm and dry. No rash noted. No erythema.  Psychiatric: She has a normal mood and affect. Her behavior is normal.    ED Course  Procedures (including critical care time)  Labs Reviewed - No data to display Ct Angio Chest Pe W/cm &/or Wo Cm  07/01/2012  *RADIOLOGY REPORT*  Clinical Data: Shortness of breath.  Bilateral lower extremity swelling.  Elevated D-dimer.  CT ANGIOGRAPHY CHEST  Technique:  Multidetector CT imaging of the chest using the standard protocol during bolus administration of intravenous contrast. Multiplanar reconstructed images including MIPs were obtained and reviewed to evaluate the vascular anatomy.  Contrast: OMNIPAQUE IOHEXOL 350 MG/ML SOLN  Comparison: 01/01/2011  Findings: No filling defects in the pulmonary arteries to suggest pulmonary emboli. Heart is normal size. Aorta is normal caliber. No mediastinal, hilar, or axillary adenopathy.  Visualized thyroid and chest wall soft tissues unremarkable.  Small hiatal hernia.  Linear scarring in the right upper lobe.  No confluent opacities. No effusions. Imaging into the upper abdomen shows no acute findings.  IMPRESSION: No acute bony abnormality.   Original Report Authenticated By: Charlett Nose, M.D.      1. Swelling of right lower extremity   2. Elevated d-dimer       MDM  Will get CT angio to r/o PE.        Loren Racer, MD 07/01/12 2329

## 2012-07-01 NOTE — Progress Notes (Signed)
  Subjective:    Sierra Ford is a 72 y.o. female who presents for evaluation of edema in the right ankle and foot. The edema has been mild. Onset of symptoms was several weeks ago, and patient reports symptoms have waxed and waned since that time. The edema is present in the evening. The patient states the problem has been intermittent for several months. The swelling has been aggravated by dependency of involved area, hot weather and increased salt intake. The swelling has been relieved by diuretics, elevation of involved area. Associated factors include: risk for DVT -- hx of previous dvt and pe. Cardiac risk factors: dyslipidemia and hypertension.  The following portions of the patient's history were reviewed and updated as appropriate: allergies, current medications, past family history, past medical history, past social history, past surgical history and problem list.  Review of Systems Pertinent items are noted in HPI.   Objective:    BP 116/82  Pulse 80  Temp(Src) 98 F (36.7 C) (Oral)  Wt 162 lb 12.8 oz (73.846 kg)  BMI 27.93 kg/m2 General appearance: alert, cooperative, appears stated age and no distress Ears: normal TM's and external ear canals both ears Nose: Nares normal. Septum midline. Mucosa normal. No drainage or sinus tenderness. Throat: lips, mucosa, and tongue normal; teeth and gums normal Neck: no adenopathy, no carotid bruit, no JVD, supple, symmetrical, trachea midline and thyroid not enlarged, symmetric, no tenderness/mass/nodules Lungs: clear to auscultation bilaterally Heart: S1, S2 normal Extremities: edema +1 pitting R low ext --no calf pain, no errythema  Cardiographics ECG: not done  Imaging Chest x-ray: not indicated   Assessment:     Edema secondary to dependency.  ---pt with hx of dvt/ PE  Plan:    Recommendations: decrease sodium in the diet, elevate feet above the level of the heart whenever possible, increase physical activity and use of  compression stockings. The patient was also instructed to call IMMEDIATELY (i.e., day or night) if any cardiopulmonary symptoms occur, especially chest pain, shortness of breath, dyspnea on exertion, paroxysmal nocturnal dyspnea, or orthopnea, and these were explained. Follow up in 2 weeks and as needed.  Check labs Doppler today

## 2012-07-01 NOTE — Patient Instructions (Signed)
Edema Edema is an abnormal build-up of fluids in tissues. Because this is partly dependent on gravity (water flows to the lowest place), it is more common in the legs and thighs (lower extremities). It is also common in the looser tissues, like around the eyes. Painless swelling of the feet and ankles is common and increases as a person ages. It may affect both legs and may include the calves or even thighs. When squeezed, the fluid may move out of the affected area and may leave a dent for a few moments. CAUSES   Prolonged standing or sitting in one place for extended periods of time. Movement helps pump tissue fluid into the veins, and absence of movement prevents this, resulting in edema.  Varicose veins. The valves in the veins do not work as well as they should. This causes fluid to leak into the tissues.  Fluid and salt overload.  Injury, burn, or surgery to the leg, ankle, or foot, may damage veins and allow fluid to leak out.  Sunburn damages vessels. Leaky vessels allow fluid to go out into the sunburned tissues.  Allergies (from insect bites or stings, medications or chemicals) cause swelling by allowing vessels to become leaky.  Protein in the blood helps keep fluid in your vessels. Low protein, as in malnutrition, allows fluid to leak out.  Hormonal changes, including pregnancy and menstruation, cause fluid retention. This fluid may leak out of vessels and cause edema.  Medications that cause fluid retention. Examples are sex hormones, blood pressure medications, steroid treatment, or anti-depressants.  Some illnesses cause edema, especially heart failure, kidney disease, or liver disease.  Surgery that cuts veins or lymph nodes, such as surgery done for the heart or for breast cancer, may result in edema. DIAGNOSIS  Your caregiver is usually easily able to determine what is causing your swelling (edema) by simply asking what is wrong (getting a history) and examining you (doing  a physical). Sometimes x-rays, EKG (electrocardiogram or heart tracing), and blood work may be done to evaluate for underlying medical illness. TREATMENT  General treatment includes:  Leg elevation (or elevation of the affected body part).  Restriction of fluid intake.  Prevention of fluid overload.  Compression of the affected body part. Compression with elastic bandages or support stockings squeezes the tissues, preventing fluid from entering and forcing it back into the blood vessels.  Diuretics (also called water pills or fluid pills) pull fluid out of your body in the form of increased urination. These are effective in reducing the swelling, but can have side effects and must be used only under your caregiver's supervision. Diuretics are appropriate only for some types of edema. The specific treatment can be directed at any underlying causes discovered. Heart, liver, or kidney disease should be treated appropriately. HOME CARE INSTRUCTIONS   Elevate the legs (or affected body part) above the level of the heart, while lying down.  Avoid sitting or standing still for prolonged periods of time.  Avoid putting anything directly under the knees when lying down, and do not wear constricting clothing or garters on the upper legs.  Exercising the legs causes the fluid to work back into the veins and lymphatic channels. This may help the swelling go down.  The pressure applied by elastic bandages or support stockings can help reduce ankle swelling.  A low-salt diet may help reduce fluid retention and decrease the ankle swelling.  Take any medications exactly as prescribed. SEEK MEDICAL CARE IF:  Your edema is   not responding to recommended treatments. SEEK IMMEDIATE MEDICAL CARE IF:   You develop shortness of breath or chest pain.  You cannot breathe when you lay down; or if, while lying down, you have to get up and go to the window to get your breath.  You are having increasing  swelling without relief from treatment.  You develop a fever over 102 F (38.9 C).  You develop pain or redness in the areas that are swollen.  Tell your caregiver right away if you have gained 3 lb/1.4 kg in 1 day or 5 lb/2.3 kg in a week. MAKE SURE YOU:   Understand these instructions.  Will watch your condition.  Will get help right away if you are not doing well or get worse. Document Released: 02/05/2005 Document Revised: 08/07/2011 Document Reviewed: 09/24/2007 [employer] Patient Information 2013 [employer].  

## 2012-07-02 ENCOUNTER — Encounter: Payer: Self-pay | Admitting: General Practice

## 2012-07-02 ENCOUNTER — Telehealth: Payer: Self-pay | Admitting: Internal Medicine

## 2012-07-02 NOTE — Telephone Encounter (Signed)
Late entry.  Call a nurse contacted me 07/01/12 [phone]) with an elevated D dimer result.  I spoke to pt.  Evaluated 07/01/12 for increased right lower extremity swelling/edema.  She informed me that she has a history of DVT and pulmonary embolus.  States with the previous PE - had no chest pain or increased sob.  She denies any chest pain or acute symptoms currently.  Is concerned regarding the increased swelling.  Had lower extremity ultrasound earlier today - negative.  After discussion with the pt and given her history, it was mutually decided to send her to the ER for further evaluation (CT chest, etc).  ER notified.

## 2012-07-08 ENCOUNTER — Encounter: Payer: Self-pay | Admitting: Family Medicine

## 2012-07-08 ENCOUNTER — Ambulatory Visit (INDEPENDENT_AMBULATORY_CARE_PROVIDER_SITE_OTHER): Payer: Medicare Other | Admitting: Family Medicine

## 2012-07-08 VITALS — BP 112/72 | HR 76 | Temp 98.4°F | Wt 165.6 lb

## 2012-07-08 DIAGNOSIS — R609 Edema, unspecified: Secondary | ICD-10-CM | POA: Diagnosis not present

## 2012-07-08 NOTE — Patient Instructions (Addendum)
Edema Edema is an abnormal build-up of fluids in tissues. Because this is partly dependent on gravity (water flows to the lowest place), it is more common in the legs and thighs (lower extremities). It is also common in the looser tissues, like around the eyes. Painless swelling of the feet and ankles is common and increases as a person ages. It may affect both legs and may include the calves or even thighs. When squeezed, the fluid may move out of the affected area and may leave a dent for a few moments. CAUSES   Prolonged standing or sitting in one place for extended periods of time. Movement helps pump tissue fluid into the veins, and absence of movement prevents this, resulting in edema.  Varicose veins. The valves in the veins do not work as well as they should. This causes fluid to leak into the tissues.  Fluid and salt overload.  Injury, burn, or surgery to the leg, ankle, or foot, may damage veins and allow fluid to leak out.  Sunburn damages vessels. Leaky vessels allow fluid to go out into the sunburned tissues.  Allergies (from insect bites or stings, medications or chemicals) cause swelling by allowing vessels to become leaky.  Protein in the blood helps keep fluid in your vessels. Low protein, as in malnutrition, allows fluid to leak out.  Hormonal changes, including pregnancy and menstruation, cause fluid retention. This fluid may leak out of vessels and cause edema.  Medications that cause fluid retention. Examples are sex hormones, blood pressure medications, steroid treatment, or anti-depressants.  Some illnesses cause edema, especially heart failure, kidney disease, or liver disease.  Surgery that cuts veins or lymph nodes, such as surgery done for the heart or for breast cancer, may result in edema. DIAGNOSIS  Your caregiver is usually easily able to determine what is causing your swelling (edema) by simply asking what is wrong (getting a history) and examining you (doing  a physical). Sometimes x-rays, EKG (electrocardiogram or heart tracing), and blood work may be done to evaluate for underlying medical illness. TREATMENT  General treatment includes:  Leg elevation (or elevation of the affected body part).  Restriction of fluid intake.  Prevention of fluid overload.  Compression of the affected body part. Compression with elastic bandages or support stockings squeezes the tissues, preventing fluid from entering and forcing it back into the blood vessels.  Diuretics (also called water pills or fluid pills) pull fluid out of your body in the form of increased urination. These are effective in reducing the swelling, but can have side effects and must be used only under your caregiver's supervision. Diuretics are appropriate only for some types of edema. The specific treatment can be directed at any underlying causes discovered. Heart, liver, or kidney disease should be treated appropriately. HOME CARE INSTRUCTIONS   Elevate the legs (or affected body part) above the level of the heart, while lying down.  Avoid sitting or standing still for prolonged periods of time.  Avoid putting anything directly under the knees when lying down, and do not wear constricting clothing or garters on the upper legs.  Exercising the legs causes the fluid to work back into the veins and lymphatic channels. This may help the swelling go down.  The pressure applied by elastic bandages or support stockings can help reduce ankle swelling.  A low-salt diet may help reduce fluid retention and decrease the ankle swelling.  Take any medications exactly as prescribed. SEEK MEDICAL CARE IF:  Your edema is   not responding to recommended treatments. SEEK IMMEDIATE MEDICAL CARE IF:   You develop shortness of breath or chest pain.  You cannot breathe when you lay down; or if, while lying down, you have to get up and go to the window to get your breath.  You are having increasing  swelling without relief from treatment.  You develop a fever over 102 F (38.9 C).  You develop pain or redness in the areas that are swollen.  Tell your caregiver right away if you have gained 3 lb/1.4 kg in 1 day or 5 lb/2.3 kg in a week. MAKE SURE YOU:   Understand these instructions.  Will watch your condition.  Will get help right away if you are not doing well or get worse. Document Released: 02/05/2005 Document Revised: 08/07/2011 Document Reviewed: 09/24/2007 ExitCare Patient Information 2013 [employer].  

## 2012-07-08 NOTE — Progress Notes (Signed)
  Subjective:    Sierra Ford is a 72 y.o. female who presents for evaluation of edema in the right lower leg. The edema has been mild. Onset of symptoms was several days ago, and patient reports symptoms have gradually improved since that time. The edema is present at bedtime. The patient states the problem is new. The swelling has been aggravated by dependency of involved area. The swelling has been relieved by diuretics, support stockings, elevation of involved area, low-salt diet. Associated factors include: nothing. Cardiac risk factors: advanced age (older than 36 for men, 59 for women), hypertension and sedentary lifestyle.  The following portions of the patient's history were reviewed and updated as appropriate: allergies, current medications, past family history, past medical history, past social history, past surgical history and problem list.  Review of Systems Pertinent items are noted in HPI.   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    Plan:    Recommendations: decrease sodium in the diet, elevate feet above the level of the heart whenever possible, increase physical activity and use of compression stockings. The patient was also instructed to call IMMEDIATELY (i.e., day or night) if any cardiopulmonary symptoms occur, especially chest pain, shortness of breath, dyspnea on exertion, paroxysmal nocturnal dyspnea, or  orthopnea, and these were explained. Follow up in 2 weeks and as needed.  Check echo-- consider vascular

## 2012-07-10 ENCOUNTER — Ambulatory Visit (INDEPENDENT_AMBULATORY_CARE_PROVIDER_SITE_OTHER): Payer: Medicare Other | Admitting: Pharmacist

## 2012-07-10 ENCOUNTER — Ambulatory Visit (HOSPITAL_COMMUNITY)
Admission: RE | Admit: 2012-07-10 | Discharge: 2012-07-10 | Disposition: A | Discharge status: Home / Self Care | Payer: Medicare Other | Source: Ambulatory Visit | Attending: Family Medicine | Admitting: Family Medicine

## 2012-07-10 DIAGNOSIS — R0989 Other specified symptoms and signs involving the circulatory and respiratory systems: Secondary | ICD-10-CM | POA: Insufficient documentation

## 2012-07-10 DIAGNOSIS — R0609 Other forms of dyspnea: Secondary | ICD-10-CM | POA: Diagnosis not present

## 2012-07-10 DIAGNOSIS — E785 Hyperlipidemia, unspecified: Secondary | ICD-10-CM

## 2012-07-10 DIAGNOSIS — I517 Cardiomegaly: Secondary | ICD-10-CM | POA: Insufficient documentation

## 2012-07-10 DIAGNOSIS — R079 Chest pain, unspecified: Secondary | ICD-10-CM

## 2012-07-10 DIAGNOSIS — R0602 Shortness of breath: Secondary | ICD-10-CM | POA: Diagnosis not present

## 2012-07-10 DIAGNOSIS — R609 Edema, unspecified: Secondary | ICD-10-CM

## 2012-07-10 MED ORDER — ROSUVASTATIN CALCIUM 20 MG PO TABS: 10.0000 mg | ORAL_TABLET | Freq: Every day | ORAL | Status: DC

## 2012-07-10 NOTE — Patient Instructions (Addendum)
Your cholesterol looks great!! Congratulations on losing weight!  Continue your Crestor 10 mg every day.  Continue your healthy diet and exercising.  Follow up 3 months.  Next appointment August 20th at 11:30 am.  Come in for blood work on Tuesday August 19th.

## 2012-07-10 NOTE — Assessment & Plan Note (Addendum)
TC 225, TG 75 (at goal<150), HDL 106 (goal >50), LDL 108 (up from 80 last visit).  LFTs wnl.  TC elevated due to high HDL.  LDL increased from last visit, likely due to decreasing Crestor dose back down to 10 mg daily. HDL excellent.  Due to recent increase in her copay for Crestor 10, patient is unable to afford the $42 copay/month.  Provided samples Crestor 20 mg and instructed her to cut them in half. Sent prescription as well to help reduce her costs/month. Encouraged her to keep up her hard work on her diet and exercise. Follow up in 3 months per her request.    Pt reviewed with Weston Brass, PharmD, CPP

## 2012-07-10 NOTE — Progress Notes (Signed)
[name] is here for follow up for her hyperlipidemia.  She is currently taking Crestor 10 mg daily and reports no issues with this regimen.  At her last visit, she had been taking Crestor 20 mg on MWF and Crestor 10 mg other days, but she was having some muscle pain with this.   Diet Breakfast - smoothie, protein shake, 3/4 c low cal cereal like Special K, or fruit; Green tea. Lunch - smoothie or protein shake  ~3 pm - salad with tomatoes or fruit and [place] dressing Dinner - 5 oz of meat plus vegetables like greens, beets, or carrots For snacks, she still eats ice cream although she reports she has cut back quite a bit. Her last [employer] trip was over a month ago.   She drinks mostly water throughout the day with an ocaissional tea sweetened with artificial sweetener.   Exercise Sierra Ford walks on the treadmill for 30 min in the mornings 5 days/week. On a "good day" she says she walks another 30 min in the evenings.  She also does an exercise program on TV that includes leg lifts, squats, etc. 1-2x/week.   Current Outpatient Prescriptions on File Prior to Visit  Medication Sig Dispense Refill  . aspirin EC 81 MG tablet Take 81 mg by mouth at bedtime.        Marland Kitchen CALCIUM-VITAMIN D PO Take 600 Units by mouth 2 (two) times daily.        . cholecalciferol (VITAMIN D-400) 400 UNITS TABS Take 400 Units by mouth 2 (two) times daily.       . furosemide (LASIX) 20 MG tablet Take 20 mg by mouth daily.      . Multiple Vitamin (MULTIVITAMIN PO) Take 1 tablet by mouth at bedtime.        . [DISCONTINUED] rosuvastatin (CRESTOR) 10 MG tablet Take 10 mg by mouth daily with supper.      . [DISCONTINUED] omega-3 acid ethyl esters (LOVAZA) 1 G capsule Take 1 g by mouth 3 (three) times daily.        . [DISCONTINUED] pravastatin (PRAVACHOL) 20 MG tablet Take 20 mg by mouth daily.         No current facility-administered medications on file prior to visit.   No Known Allergies

## 2012-07-10 NOTE — Progress Notes (Signed)
  Echocardiogram 2D Echocardiogram has been performed.  Jorje Guild 07/10/2012, 3:57 PM

## 2012-07-17 ENCOUNTER — Encounter: Payer: Self-pay | Admitting: Cardiology

## 2012-07-17 ENCOUNTER — Ambulatory Visit (INDEPENDENT_AMBULATORY_CARE_PROVIDER_SITE_OTHER): Payer: Medicare Other | Admitting: Cardiology

## 2012-07-17 VITALS — BP 125/70 | HR 72 | Ht 62.0 in | Wt 164.0 lb

## 2012-07-17 DIAGNOSIS — I519 Heart disease, unspecified: Secondary | ICD-10-CM | POA: Diagnosis not present

## 2012-07-17 NOTE — Progress Notes (Signed)
HPI The patient presents for evaluation of an abnormal echocardiogram. She was recently seen in the emergency room with some right greater than left lower extremity swelling. Because of a previous history of pulmonary emboli she did have lower extremity Doppler which demonstrated no DVT and doesn't mention any venous insufficiency. She was sent for an echocardiogram which demonstrated a well preserved ejection fraction. There was however mention of some mild diastolic dysfunction.  The patient denies any new symptoms such as chest discomfort, neck or arm discomfort. There has been no new shortness of breath, PND or orthopnea. There have been no reported palpitations, presyncope or syncope.  She walks on a treadmill 4 times per week without significant limitations.  She has actually lost about 18 pounds through diet and exercise recently.  No Known Allergies  Current Outpatient Prescriptions  Medication Sig Dispense Refill  . aspirin EC 81 MG tablet Take 81 mg by mouth at bedtime.        Marland Kitchen CALCIUM-VITAMIN D PO Take 600 Units by mouth 2 (two) times daily.        . cholecalciferol (VITAMIN D-400) 400 UNITS TABS Take 400 Units by mouth 2 (two) times daily.       . furosemide (LASIX) 20 MG tablet Take 20 mg by mouth daily.      . Misc Natural Products (7-KETO LEAN PO) Take 1 capsule by mouth 2 (two) times daily.      . Multiple Vitamin (MULTIVITAMIN PO) Take 1 tablet by mouth at bedtime.        Marland Kitchen OVER THE COUNTER MEDICATION Take 1 capsule by mouth daily. Forskolin (coleus forskolii)      . rosuvastatin (CRESTOR) 20 MG tablet Take 0.5 tablets (10 mg total) by mouth daily.  30 tablet  2  . [DISCONTINUED] omega-3 acid ethyl esters (LOVAZA) 1 G capsule Take 1 g by mouth 3 (three) times daily.        . [DISCONTINUED] pravastatin (PRAVACHOL) 20 MG tablet Take 20 mg by mouth daily.         No current facility-administered medications for this visit.    Past Medical History  Diagnosis Date  . DVT (deep  venous thrombosis)     hx of  . Hyperlipidemia   . PE (pulmonary embolism)     hx of  . Hyperglycemia   . UTI (lower urinary tract infection)     hx  . Ectopic pregnancy with intrauterine pregnancy   . Chest pain     CLite with apical ischemia in 2006 - normal coronary arteries by [facility] in 12/2004;  Myoview 11/12:  Low risk stress nuclear study with a small, partially reversible apical defect most likely related to apical thinning; cannot R/O very mild apical ischemia.  EF: 75%   . Hiatal hernia   . CAD (coronary artery disease)     Past Surgical History  Procedure Laterality Date  . Foot surgery    . Abdominal exploration surgery    . Cortisone shot    . Ectopic pregnancy surgery    . Bunionectomy      right    ROS:  As stated in the HPI and negative for all other systems.  PHYSICAL EXAM BP 125/70  Pulse 72  Ht 5\' 2"  (1.575 m)  Wt 164 lb (74.39 kg)  BMI 29.99 kg/m2 GENERAL:  Well appearing HEENT:  Pupils equal round and reactive, fundi not visualized, oral mucosa unremarkable NECK:  No jugular venous distention, waveform within normal  limits, carotid upstroke brisk and symmetric, no bruits, no thyromegaly LYMPHATICS:  No cervical, inguinal adenopathy LUNGS:  Clear to auscultation bilaterally BACK:  No CVA tenderness CHEST:  Unremarkable HEART:  PMI not displaced or sustained,S1 and S2 within normal limits, no S3, no S4, no clicks, no rubs, no murmurs ABD:  Flat, positive bowel sounds normal in frequency in pitch, no bruits, no rebound, no guarding, no midline pulsatile mass, no hepatomegaly, no splenomegaly EXT:  2 plus pulses throughout, trace edema, no cyanosis no clubbing SKIN:  No rashes no nodules NEURO:  Cranial nerves II through XII grossly intact, motor grossly intact throughout PSYCH:  Cognitively intact, oriented to person place and time  ASSESSMENT AND PLAN  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 She really doesn't have any  symptoms related to this. We discussed the physiology. No further cardiovascular testing is suggested. No change in medications is indicated. I did review the ER records and echocardiogram.  EDEMA:  The etiology of this is not clear. There is no evidence of clot or venous insufficiency. Given this conservative management is indicated and we talked about when necessary dosing of diuretic. She already employs salt and fluid restriction.

## 2012-07-17 NOTE — Patient Instructions (Addendum)
The current medical regimen is effective;  continue present plan and medications.  Follow up as needed 

## 2012-07-22 ENCOUNTER — Telehealth: Payer: Self-pay | Admitting: Family Medicine

## 2012-07-22 NOTE — Telephone Encounter (Signed)
[facility] Triage Call Report Triage Record Num: [id number] Operator: Aundra Millet Patient Name: Sierra Ford Call Date & Time: 07/01/2012 7:27:58PM Patient Phone: [phone] PCP: Lelon Perla Patient Gender: Female PCP Fax : [phone] Patient DOB: 1940/10/17 Practice Name: [facility] Reason for Call: Caller: [name]; PCP: Lelon Perla.; CB#: [phone]; Call regarding [name] is calling from [place] regarding a D-dimer ordered on [name] by Lelon Perla..; RN spoke to [name]. who is reporting a STAT lab results obtained today at 1213 per [place] -- D - Dimer: 0.51 ( HIGH) Normal range : 0-0.48 - No prior results on file. Pt has had hx of PE per EPIC record. RN called Dr. Lorin Picket and she stated she will call pt and contact info given to MD. Protocol(s) Used: Office Note Recommended Outcome per Protocol: Information Noted and Sent to Office Reason for Outcome: Caller information to office Care Advice: ~ 05/

## 2012-07-22 NOTE — Telephone Encounter (Signed)
Call report from 07/01/12.     KP

## 2012-10-06 ENCOUNTER — Other Ambulatory Visit (INDEPENDENT_AMBULATORY_CARE_PROVIDER_SITE_OTHER): Payer: Medicare Other

## 2012-10-06 DIAGNOSIS — E785 Hyperlipidemia, unspecified: Secondary | ICD-10-CM

## 2012-10-06 LAB — LIPID PANEL
Cholesterol: 229 mg/dL — ABNORMAL HIGH (ref 0–200)
HDL: 119.2 mg/dL (ref >39.00)
Total CHOL/HDL Ratio: 2
VLDL: 12 mg/dL (ref 0.0–40.0)

## 2012-10-06 LAB — HEPATIC FUNCTION PANEL
Albumin: 4.3 g/dL (ref 3.5–5.2)
Bilirubin, Direct: 0.1 mg/dL (ref 0.0–0.3)
Total Protein: 7.6 g/dL (ref 6.0–8.3)

## 2012-10-07 ENCOUNTER — Other Ambulatory Visit: Payer: Medicare Other

## 2012-10-08 ENCOUNTER — Ambulatory Visit (INDEPENDENT_AMBULATORY_CARE_PROVIDER_SITE_OTHER): Payer: Medicare Other | Admitting: Pharmacist

## 2012-10-08 DIAGNOSIS — E785 Hyperlipidemia, unspecified: Secondary | ICD-10-CM | POA: Diagnosis not present

## 2012-10-08 NOTE — Patient Instructions (Signed)
Continue Crestor 10mg  daily.  If you continue to have memory issues and there are no other causes, give me a call and we can try to stop Crestor for a few weeks to see if there is any improvement.  Recheck labs in 6 months.

## 2012-10-09 NOTE — Assessment & Plan Note (Signed)
Pt's cholesterol improved since last visit.  TC remains elevated as a result of extremely high HDL.  LDL decreased to 96.  Will continue Crestor 10mg  daily since she had myalgias with any higher dose.  ? Memory issues related to Crestor.  Instructed pt to monitor and if they become worse we will try a trial off medication to see if there is any improvement.  Follow up in 6 months if no issues between now and then.

## 2012-10-09 NOTE — Progress Notes (Signed)
Sierra Ford is here for follow up for Sierra Ford hyperlipidemia.  Sierra Ford is currently taking Crestor 10 mg daily and reports no issues with this regimen.  Sierra Ford biggest concern in Sierra Ford memory.  Sierra Ford states Sierra Ford has started forgetting things more often.  Sierra Ford questions if this may be related to Crestor.   Diet The biggest change in Sierra Ford diet has been addition of juicing.  Sierra Ford will add spinach, tomatoes, cabbage, and kale with fruit. Breakfast - smoothie, protein shake, 3/4 c low cal cereal like Special K, or fruit; Green tea. Lunch - smoothie or protein shake  ~3 pm - salad with tomatoes or fruit and [place] dressing Dinner - 5 oz of meat plus vegetables like greens, beets, or carrots Sierra Ford drinks mostly water throughout the day with an ocaissional tea sweetened with artificial sweetener.   Exercise Ms. Vowell walks on the treadmill for 30 min in the mornings 5 days/week. On a "good day" Sierra Ford says Sierra Ford walks another 30 min in the evenings.   Current Outpatient Prescriptions on File Prior to Visit  Medication Sig Dispense Refill  . aspirin EC 81 MG tablet Take 81 mg by mouth at bedtime.        Marland Kitchen CALCIUM-VITAMIN D PO Take 600 Units by mouth 2 (two) times daily.        . cholecalciferol (VITAMIN D-400) 400 UNITS TABS Take 400 Units by mouth 2 (two) times daily.       . furosemide (LASIX) 20 MG tablet Take 20 mg by mouth daily.      . Misc Natural Products (7-KETO LEAN PO) Take 1 capsule by mouth 2 (two) times daily.      . Multiple Vitamin (MULTIVITAMIN PO) Take 1 tablet by mouth at bedtime.        Marland Kitchen OVER THE COUNTER MEDICATION Take 1 capsule by mouth daily. Forskolin (coleus forskolii)      . rosuvastatin (CRESTOR) 20 MG tablet Take 0.5 tablets (10 mg total) by mouth daily.  30 tablet  2  . [DISCONTINUED] omega-3 acid ethyl esters (LOVAZA) 1 G capsule Take 1 g by mouth 3 (three) times daily.        . [DISCONTINUED] pravastatin (PRAVACHOL) 20 MG tablet Take 20 mg by mouth daily.         No current  facility-administered medications on file prior to visit.   No Known Allergies

## 2012-12-15 DIAGNOSIS — Z1212 Encounter for screening for malignant neoplasm of rectum: Secondary | ICD-10-CM | POA: Diagnosis not present

## 2012-12-15 DIAGNOSIS — Z130 Encounter for screening for diseases of the blood and blood-forming organs and certain disorders involving the immune mechanism: Secondary | ICD-10-CM | POA: Diagnosis not present

## 2012-12-15 DIAGNOSIS — Z1231 Encounter for screening mammogram for malignant neoplasm of breast: Secondary | ICD-10-CM | POA: Diagnosis not present

## 2012-12-15 DIAGNOSIS — Z124 Encounter for screening for malignant neoplasm of cervix: Secondary | ICD-10-CM | POA: Diagnosis not present

## 2012-12-15 DIAGNOSIS — M899 Disorder of bone, unspecified: Secondary | ICD-10-CM | POA: Diagnosis not present

## 2012-12-15 DIAGNOSIS — Z01419 Encounter for gynecological examination (general) (routine) without abnormal findings: Secondary | ICD-10-CM | POA: Diagnosis not present

## 2012-12-16 ENCOUNTER — Telehealth: Payer: Self-pay | Admitting: Family Medicine

## 2012-12-16 NOTE — Telephone Encounter (Signed)
Patient called and had a question about having her colonoscopy done every 5 years but the last time she had one done it stated to come back every 10 years. Patient was just concern about not coming every 5 years to have it done.

## 2012-12-16 NOTE — Telephone Encounter (Signed)
The patient needs to call Dr.Brodie and discuss with them.      KP

## 2012-12-31 DIAGNOSIS — J019 Acute sinusitis, unspecified: Secondary | ICD-10-CM | POA: Diagnosis not present

## 2013-02-19 LAB — HM PAP SMEAR: HM PAP: NORMAL

## 2013-02-19 LAB — HM MAMMOGRAPHY: HM Mammogram: NORMAL

## 2013-03-04 ENCOUNTER — Other Ambulatory Visit: Payer: Self-pay | Admitting: Family Medicine

## 2013-03-24 ENCOUNTER — Other Ambulatory Visit: Payer: Self-pay | Admitting: Cardiology

## 2013-04-07 ENCOUNTER — Other Ambulatory Visit: Payer: Medicare Other

## 2013-04-08 ENCOUNTER — Ambulatory Visit: Payer: Medicare Other | Admitting: Pharmacist

## 2013-04-14 ENCOUNTER — Other Ambulatory Visit: Payer: Medicare Other

## 2013-04-15 ENCOUNTER — Ambulatory Visit: Payer: Medicare Other | Admitting: Pharmacist

## 2013-04-16 ENCOUNTER — Ambulatory Visit: Payer: Medicare Other | Admitting: Family Medicine

## 2013-04-21 ENCOUNTER — Other Ambulatory Visit: Payer: Self-pay | Admitting: Family Medicine

## 2013-04-22 ENCOUNTER — Other Ambulatory Visit (INDEPENDENT_AMBULATORY_CARE_PROVIDER_SITE_OTHER): Payer: Medicare Other

## 2013-04-22 DIAGNOSIS — E785 Hyperlipidemia, unspecified: Secondary | ICD-10-CM

## 2013-04-22 LAB — HEPATIC FUNCTION PANEL
ALT: 32 U/L (ref 0–35)
AST: 28 U/L (ref 0–37)
Albumin: 4.1 g/dL (ref 3.5–5.2)
Alkaline Phosphatase: 74 U/L (ref 39–117)
BILIRUBIN DIRECT: 0.1 mg/dL (ref 0.0–0.3)
BILIRUBIN TOTAL: 1 mg/dL (ref 0.3–1.2)
Total Protein: 7.5 g/dL (ref 6.0–8.3)

## 2013-04-22 LAB — LIPID PANEL
CHOL/HDL RATIO: 2
Cholesterol: 233 mg/dL — ABNORMAL HIGH (ref 0–200)
HDL: 110.3 mg/dL (ref >39.00)
LDL CALC: 109 mg/dL — AB (ref 0–99)
Triglycerides: 67 mg/dL (ref 0.0–149.0)
VLDL: 13.4 mg/dL (ref 0.0–40.0)

## 2013-04-23 ENCOUNTER — Ambulatory Visit (INDEPENDENT_AMBULATORY_CARE_PROVIDER_SITE_OTHER): Payer: Medicare Other | Admitting: Pharmacist

## 2013-04-23 DIAGNOSIS — E785 Hyperlipidemia, unspecified: Secondary | ICD-10-CM | POA: Diagnosis not present

## 2013-04-23 NOTE — Patient Instructions (Signed)
Thanks for visiting [name] today. It was great to see you.  - Continue Crestor 10mg  daily - Resume eating healthy with Dr. Thompson Caul diet guide but be mindful of the ingredients (less sugar and less salt) - Continue exercising 4-5 times a week  Keep up the good work, we will recheck your labs and see you in 6 months .

## 2013-04-23 NOTE — Assessment & Plan Note (Addendum)
Labs 04/23/13  TC 233 from 229 (64mo ago), Trig 67 from 60, HDL 110, LDL 109 from 96 (65mo ago), LFTs normal Patient's LDL has increased since last visit but not remarkably, and can be attributed to dietary indiscretion lately. Patient instructed to continue Crestor 10mg  daily and to reduce water intake to 6 glasses of water/day if it seems to provide significant improvement. No significant memory loss worsening noted. Encouraged patient to continue exercising as she has been. Patient verbalized goal weight to be 150lb (currently self-reported ~170lbs). Advised patient to resume adherence to nutrition guide book as it seems to have provided benefit in the past (more vegetables/greens). Would be pleased to see 10lb weight loss or more at next visit. Follow up in 6 months.

## 2013-04-23 NOTE — Progress Notes (Signed)
Ms. Gugel arrives in good spirits, for 38-month hyperlipidemia follow-up. She is a long standing patient of ours, with a history of having been on statins (including atorvastatin, pravastatin) and lovaza, but with myalgias. Currently controlled on crestor 10mg . LDL in 2013, 197 > 04/22/13 109. She says she "has been bad lately" and has not been juicing or eating as healthy d/t recent vacations and dietary indiscretion. No complaints with Crestor as of now except for noticed cramping when drinking ~ 8 glasses of water. Seems to be asymptomatic with 6 glasses.  Diet In that past, has been following a nutrition book by Chief Strategy Officer Dr. Tamala Julian, which encouraged more frequent, moderate meals and juicing, but recently has not been doing so Breakfast - cereal, oatmeal w/ artificial sweatener, seldom scrambled eggs with butter, recently has been eating leftover bacon from family gathering Lunch - recently has been skipping Dinner - leftover bacon wrapped-meatloaf, black eyed peas, kale, salmon/tilapia, loves spinach Snacks - Patient loves icecream and will eat ~ once/week  Exercise Enjoys exercising 30 min on the treadmill at home 4-5x a week. (Sometimes twice per day) Pt appears dedicated to continue.

## 2013-04-24 ENCOUNTER — Ambulatory Visit (INDEPENDENT_AMBULATORY_CARE_PROVIDER_SITE_OTHER): Payer: Medicare Other | Admitting: Family Medicine

## 2013-04-24 ENCOUNTER — Ambulatory Visit (INDEPENDENT_AMBULATORY_CARE_PROVIDER_SITE_OTHER)
Admission: RE | Admit: 2013-04-24 | Discharge: 2013-04-24 | Disposition: A | Discharge status: Home / Self Care | Payer: Medicare Other | Source: Ambulatory Visit | Attending: Family Medicine | Admitting: Family Medicine

## 2013-04-24 ENCOUNTER — Encounter: Payer: Self-pay | Admitting: Family Medicine

## 2013-04-24 VITALS — BP 116/70 | HR 73 | Temp 99.2°F | Ht 64.0 in | Wt 180.2 lb

## 2013-04-24 DIAGNOSIS — R0602 Shortness of breath: Secondary | ICD-10-CM

## 2013-04-24 DIAGNOSIS — I10 Essential (primary) hypertension: Secondary | ICD-10-CM | POA: Diagnosis not present

## 2013-04-24 DIAGNOSIS — R252 Cramp and spasm: Secondary | ICD-10-CM

## 2013-04-24 DIAGNOSIS — Z23 Encounter for immunization: Secondary | ICD-10-CM

## 2013-04-24 DIAGNOSIS — E669 Obesity, unspecified: Secondary | ICD-10-CM | POA: Insufficient documentation

## 2013-04-24 LAB — D-DIMER, QUANTITATIVE: D-Dimer, Quant: 0.31 ug/mL-FEU (ref 0.00–0.48)

## 2013-04-24 LAB — BASIC METABOLIC PANEL
BUN: 16 mg/dL (ref 6–23)
CHLORIDE: 106 meq/L (ref 96–112)
CO2: 24 meq/L (ref 19–32)
Calcium: 10.4 mg/dL (ref 8.4–10.5)
Creatinine, Ser: 0.8 mg/dL (ref 0.4–1.2)
GFR: 89.49 mL/min (ref >60.00)
GLUCOSE: 88 mg/dL (ref 70–99)
POTASSIUM: 4.4 meq/L (ref 3.5–5.1)
SODIUM: 137 meq/L (ref 135–145)

## 2013-04-24 MED ORDER — FUROSEMIDE 20 MG PO TABS
ORAL_TABLET | ORAL | Status: DC
Start: 2013-04-24 — End: 2014-01-01

## 2013-04-24 NOTE — Patient Instructions (Signed)
Leg Cramps  Leg cramps that occur during exercise can be caused by poor circulation or dehydration. However, muscle cramps that occur at rest or during the night are usually not due to any serious medical problem. Heat cramps may cause muscle spasms during hot weather.   CAUSES  There is no clear cause for muscle cramps. However, dehydration may be a factor for those who do not drink enough fluids and those who exercise in the heat. Imbalances in the level of sodium, potassium, calcium or magnesium in the muscle tissue may also be a factor. Some medications, such as water pills (diuretics), may cause loss of chemicals that the body needs (like sodium and potassium) and cause muscle cramps.  TREATMENT   · Make sure your diet has enough fluids and essential minerals for the muscle to work normally.  · Avoid strenuous exercise for several days if you have been having frequent leg cramps.  · Stretch and massage the cramped muscle for several minutes.  · Some medicines may be helpful in some patients with night cramps. Only take over-the-counter or prescription medicines as directed by your caregiver.  SEEK IMMEDIATE MEDICAL CARE IF:   · Your leg cramps become worse.  · Your foot becomes cold, numb, or blue.  Document Released: 03/15/2004 Document Revised: 04/30/2011 Document Reviewed: 03/02/2008  ExitCare® Patient Information ©2014 [employer].

## 2013-04-24 NOTE — Progress Notes (Signed)
Patient ID: Sierra Ford, female   DOB: 05-14-1940, 73 y.o.   MRN: [id number]   Subjective:    Patient ID: Sierra Ford, female    DOB: Oct 18, 1940, 73 y.o.   MRN: [id number] HPI Pt here c/o leg cramps and she is looking into starting herballife for weight loss.  Pt also c/o sob with exertion -- she feels like it has gotten worse.           Objective:    BP 116/70  Pulse 73  Temp(Src) 99.2 F (37.3 C) (Oral)  Ht 5\' 4"  (1.626 m)  Wt 180 lb 3.2 oz (81.738 kg)  BMI 30.92 kg/m2  SpO2 94% General appearance: alert, cooperative, appears stated age and no distress Neck: no adenopathy, supple, symmetrical, trachea midline and thyroid not enlarged, symmetric, no tenderness/mass/nodules Lungs: clear to auscultation bilaterally Heart: regular rate and rhythm, S1, S2 normal, no murmur, click, rub or gallop Extremities: extremities normal, atraumatic, no cyanosis or edema       Assessment & Plan:  1. Leg cramps Check labs- Basic metabolic panel  2. HTN (hypertension) stable - furosemide (LASIX) 20 MG tablet; TAKE ONE TABLET BY MOUTH ONCE DAILY  Dispense: 90 tablet; Refill: 1  3. SOB (shortness of breath) F/u cardio - DG Chest 2 View; Future - D-Dimer, Quantitative

## 2013-04-24 NOTE — Progress Notes (Signed)
Pre visit review using our clinic review tool, if applicable. No additional management support is needed unless otherwise documented below in the visit note. 

## 2013-04-27 ENCOUNTER — Telehealth: Payer: Self-pay | Admitting: *Deleted

## 2013-04-27 NOTE — Telephone Encounter (Signed)
      To: Elvia Collum Fax: [phone] From: [facility] Date/ Time: 04/24/2013 5:55 PM Taken By: Haywood Filler, CSR Caller: [place]: Orpah Melter Patient: Sierra Ford, Sierra Ford DOB: [birth year] Phone: [phone] Reason for Call: Janett Billow is calling from [facility] regarding a D-Dimer ordered on Kathrin Penner by Dr. Juliette Alcide, Yvonne3/07/2013 12:28:00 PM. The results were 0.31. Regarding Appointment: Appt Date: Appt Time: Unknown Provider: Reason: Details: Outcome:

## 2013-04-27 NOTE — Telephone Encounter (Signed)
Spoke with patient and made aware of lab results and to follow up w/cards

## 2013-04-27 NOTE — Progress Notes (Signed)
Spoke with patient and made aware of lab results. Patient advised to follow up w/cards per Dr. Nonda Lou written note.

## 2013-05-01 NOTE — Progress Notes (Signed)
Pt reviewed with Porfirio Oar, PharmD, CPP.  Agree with plan below.

## 2013-05-18 ENCOUNTER — Encounter: Payer: Self-pay | Admitting: Nurse Practitioner

## 2013-05-18 ENCOUNTER — Ambulatory Visit (INDEPENDENT_AMBULATORY_CARE_PROVIDER_SITE_OTHER): Payer: Medicare Other | Admitting: Nurse Practitioner

## 2013-05-18 VITALS — BP 110/80 | HR 68 | Ht 62.0 in | Wt 177.1 lb

## 2013-05-18 DIAGNOSIS — R0989 Other specified symptoms and signs involving the circulatory and respiratory systems: Secondary | ICD-10-CM | POA: Diagnosis not present

## 2013-05-18 DIAGNOSIS — R0600 Dyspnea, unspecified: Secondary | ICD-10-CM

## 2013-05-18 DIAGNOSIS — I5032 Chronic diastolic (congestive) heart failure: Secondary | ICD-10-CM | POA: Diagnosis not present

## 2013-05-18 DIAGNOSIS — R0609 Other forms of dyspnea: Secondary | ICD-10-CM

## 2013-05-18 LAB — BRAIN NATRIURETIC PEPTIDE: Pro B Natriuretic peptide (BNP): 28 pg/mL (ref 0.0–100.0)

## 2013-05-18 NOTE — Progress Notes (Addendum)
Sierra Ford Date of Birth: 06/04/1940 Medical Record #[id number]  History of Present Illness: Sierra Ford is seen back today for a work in visit. Seen for Dr. Percival Spanish. She is a 73 year old female with HLD, prior DVT, prior PE, normal coronaries by cath in 2006, low risk Myoview in 4166, diastolic HF per echo back in 2014 and hiatal hernia.   Last seen here by Dr. Percival Spanish in May of 2014 - this was for review of her echo.   Seen earlier this month by PCP - noted worsening dyspnea. D dimer was normal. She was placed on lasix. CXR normal.  Comes in today. Here alone. Notes that for at least the last 4 to 6 month she has had progressive DOE - only with steps, inclines, etc. She had stopped exercising, gained all of her weight back that she had previously lost - had had lots of trips and then the holidays. Probably had too much salt as well. Just started back on her treadmill. No chest pain. No coughing. Some swelling in her lower legs. No cough.    Current Outpatient Prescriptions  Medication Sig Dispense Refill  . aspirin EC 81 MG tablet Take 81 mg by mouth at bedtime.        Marland Kitchen CALCIUM-VITAMIN D PO Take 600 Units by mouth 2 (two) times daily.        . cholecalciferol (VITAMIN D-400) 400 UNITS TABS Take 400 Units by mouth 2 (two) times daily.       . CRESTOR 20 MG tablet TAKE ONE-HALF TABLET BY MOUTH ONCE DAILY  30 tablet  5  . furosemide (LASIX) 20 MG tablet TAKE ONE TABLET BY MOUTH ONCE DAILY  90 tablet  1  . Multiple Vitamin (MULTIVITAMIN PO) Take 1 tablet by mouth at bedtime.       . [DISCONTINUED] omega-3 acid ethyl esters (LOVAZA) 1 G capsule Take 1 g by mouth 3 (three) times daily.        . [DISCONTINUED] pravastatin (PRAVACHOL) 20 MG tablet Take 20 mg by mouth daily.         No current facility-administered medications for this visit.    No Known Allergies  Past Medical History  Diagnosis Date  . DVT (deep venous thrombosis)     hx of  . Hyperlipidemia   . PE (pulmonary  embolism)     hx of  . Hyperglycemia   . UTI (lower urinary tract infection)     hx  . Ectopic pregnancy with intrauterine pregnancy   . Chest pain     CLite with apical ischemia in 2006 - normal coronary arteries by [facility] in 12/2004;  Myoview 11/12:  Low risk stress nuclear study with a small, partially reversible apical defect most likely related to apical thinning; cannot R/O very mild apical ischemia.  EF: 75%   . Hiatal hernia   . CAD (coronary artery disease)     Past Surgical History  Procedure Laterality Date  . Foot surgery    . Abdominal exploration surgery    . Ectopic pregnancy surgery    . Bunionectomy      right    History  Smoking status  . Former Smoker -- 21 years  . Quit date: 11/21/2009  Smokeless tobacco  . Never Used    History  Alcohol Use No    Family History  Problem Relation Age of Onset  . Heart failure Mother     died from  . Colon polyps Mother   .  Diabetes Maternal Aunt     grandaughter    Review of Systems: The review of systems is per the HPI.  All other systems were reviewed and are negative.  Physical Exam: BP 110/80  Pulse 68  Ht 5\' 2"  (1.575 m)  Wt 177 lb 1.9 oz (80.341 kg)  BMI 32.39 kg/m2  SpO2 98% Oxygen sat only drops to 93% with walking here in the office.  Patient is very pleasant and in no acute distress. She is obese - has gained 14 pounds since last visit.  Skin is warm and dry. Color is normal.  HEENT is unremarkable. Normocephalic/atraumatic. PERRL. Sclera are nonicteric. Neck is supple. No masses. No JVD. Lungs are clear. Cardiac exam shows a regular rate and rhythm. Abdomen is soft. Extremities are with trace edema. Gait and ROM are intact. No gross neurologic deficits noted.  Wt Readings from Last 3 Encounters:  05/18/13 177 lb 1.9 oz (80.341 kg)  04/24/13 180 lb 3.2 oz (81.738 kg)  07/17/12 164 lb (74.39 kg)    LABORATORY DATA: Lab Results  Component Value Date   WBC 5.3 07/01/2012   HGB 15.0 07/01/2012    HCT 44.3 07/01/2012   PLT 234.0 07/01/2012   GLUCOSE 88 04/24/2013   CHOL 233* 04/22/2013   TRIG 67.0 04/22/2013   HDL 110.30 04/22/2013   LDLDIRECT 95.9 10/06/2012   LDLCALC 109* 04/22/2013   ALT 32 04/22/2013   AST 28 04/22/2013   NA 137 04/24/2013   K 4.4 04/24/2013   CL 106 04/24/2013   CREATININE 0.8 04/24/2013   BUN 16 04/24/2013   CO2 24 04/24/2013   TSH 1.61 08/07/2007   INR 1.03 04/20/2009   HGBA1C 6.3* 03/29/2008   Echo Study Conclusions from May 2014  Left ventricle: The cavity size was normal. There was mild concentric hypertrophy. Systolic function was normal. The estimated ejection fraction was in the range of 55% to 60%. Wall motion was normal; there were no regional wall motion abnormalities. Doppler parameters are consistent with abnormal left ventricular relaxation (grade 1 diastolic dysfunction).   CARDIAC CATH FROM 2006 HEMODYNAMIC RESULTS: Central aortic pressure 106/84 with a mean of 82. LV  103/1 with an LVEDP of 5. There is no gradient across the aortic valve on  pullback.  Left main: Was normal.  LAD was a long vessel coursing to the apex. It gave off a large proximal  diagonal. This was angiographically normal.  The left circumflex was a moderate-sized system made up primarily of a large  OM-1. The distal AV groove circumflex was small. There was no angiographic  CAD.  The right coronary artery was a dominant vessel; it gave off an RV branch, a  moderate-to-large PDA and a small PL, and moderate-to-large second PL.  There is no angiographic CAD.  LEFT VENTRICULOGRAM: Left ventriculogram done the RAO position showed an EF  of approximately 65% with no wall motion abnormalities or mitral  regurgitation.  ASSESSMENT:  1. Normal coronary arteries with no evidence of atherosclerotic disease.  2. Normal left ventricular function with low filling pressures.  3. Dyspnea and unclear etiology.  PLAN:  1. We will continue with risk factor modification.  2. She will be eligible for  discharge today. She will resume her Coumadin.  3. I would consider possible cardiopulmonary exercise test for further  evaluation of her dyspnea if this persists.  Glori Bickers, M.D. [facility]  Electronically Signed  DB/MEDQ D: 12/29/2004 T: 12/30/2004 Job: [id number]   Assessment / Plan: 1.  DOE - probably more a reflection of deconditioning - she has gained weight and had stopped exercising. Will arrange for GXT. Check BNP and send for PFTs given her long history of smoking. EKG was checked today and is unchanged.   2. Diastolic HF - now trying to cut back on salt. I do not think we need to repeat her echo at this time  3. Normal coronaries per remote cath  4. HTN - BP is good  5. HLD - on statin therapy.  6. Obesity  Patient is agreeable to this plan and will call if any problems develop in the interim.   Burtis Junes, RN, [place] [address] [place], [place]  [phone]

## 2013-05-18 NOTE — Patient Instructions (Addendum)
Stay on your current medicines  I want to check one blood test that tells me if your shortness of breath is coming from your lungs or from your heart  We will arrange for GXT  We will arrange for PFTs at [facility]  See Dr. Percival Spanish in May  Call the [place] office at [phone] if you have any questions, problems or concerns.

## 2013-05-25 ENCOUNTER — Ambulatory Visit (INDEPENDENT_AMBULATORY_CARE_PROVIDER_SITE_OTHER): Payer: Medicare Other | Admitting: Internal Medicine

## 2013-05-25 DIAGNOSIS — I5032 Chronic diastolic (congestive) heart failure: Secondary | ICD-10-CM

## 2013-05-25 DIAGNOSIS — R0600 Dyspnea, unspecified: Secondary | ICD-10-CM

## 2013-05-25 DIAGNOSIS — R0609 Other forms of dyspnea: Secondary | ICD-10-CM

## 2013-05-25 DIAGNOSIS — R0989 Other specified symptoms and signs involving the circulatory and respiratory systems: Secondary | ICD-10-CM

## 2013-05-25 LAB — PULMONARY FUNCTION TEST
DL/VA % pred: 80 %
DL/VA: 3.87 ml/min/mmHg/L
DLCO unc % pred: 59 %
DLCO unc: 14.34 ml/min/mmHg
FEF 25-75 Post: 2.4 L/sec
FEF 25-75 Pre: 1.86 L/sec
FEF2575-%Change-Post: 28 %
FEF2575-%Pred-Post: 147 %
FEF2575-%Pred-Pre: 114 %
FEV1-%Change-Post: 4 %
FEV1-%Pred-Post: 102 %
FEV1-%Pred-Pre: 98 %
FEV1-Post: 1.84 L
FEV1-Pre: 1.76 L
FEV1FVC-%Change-Post: 3 %
FEV1FVC-%Pred-Pre: 105 %
FEV6-%Change-Post: 1 %
FEV6-%Pred-Post: 99 %
FEV6-%Pred-Pre: 97 %
FEV6-Post: 2.2 L
FEV6-Pre: 2.16 L
FEV6FVC-%Pred-Post: 104 %
FEV6FVC-%Pred-Pre: 104 %
FVC-%Change-Post: 1 %
FVC-%Pred-Post: 95 %
FVC-%Pred-Pre: 93 %
FVC-Post: 2.2 L
FVC-Pre: 2.16 L
Post FEV1/FVC ratio: 84 %
Post FEV6/FVC ratio: 100 %
Pre FEV1/FVC ratio: 81 %
Pre FEV6/FVC Ratio: 100 %
RV % pred: 79 %
RV: 1.76 L
TLC % pred: 76 %
TLC: 3.85 L

## 2013-05-25 NOTE — Progress Notes (Signed)
PFT done. Iana Buzan, CMA  

## 2013-05-26 ENCOUNTER — Other Ambulatory Visit: Payer: Self-pay | Admitting: *Deleted

## 2013-05-26 DIAGNOSIS — R942 Abnormal results of pulmonary function studies: Secondary | ICD-10-CM

## 2013-06-04 ENCOUNTER — Institutional Professional Consult (permissible substitution): Payer: Medicare Other | Admitting: Internal Medicine

## 2013-06-12 ENCOUNTER — Ambulatory Visit (INDEPENDENT_AMBULATORY_CARE_PROVIDER_SITE_OTHER): Payer: Medicare Other | Admitting: Emergency Medicine

## 2013-06-12 ENCOUNTER — Encounter: Payer: Self-pay | Admitting: Emergency Medicine

## 2013-06-12 VITALS — BP 118/64 | HR 65 | Ht 63.0 in | Wt 176.8 lb

## 2013-06-12 DIAGNOSIS — R0609 Other forms of dyspnea: Secondary | ICD-10-CM | POA: Diagnosis not present

## 2013-06-12 DIAGNOSIS — R0989 Other specified symptoms and signs involving the circulatory and respiratory systems: Secondary | ICD-10-CM | POA: Diagnosis not present

## 2013-06-12 NOTE — Progress Notes (Signed)
Subjective:    Patient ID: Sierra Ford, female    DOB: 1940-07-25, 73 y.o.   MRN: [id number]  HPI 73 yo former smoker (17 pk-yrs), hx CAD, DVT/PE (remote, not on anti-coag), hyperlipidemia, HTN with diastolic dysfxn. She is referred by Dr Etter Sjogren for evaluation of exertional SOB. She has been an active person, exercises. She notes that she has had some wt gain about 50 lbs over the last 5 years.  No hx asthma, no wheeze, no CP, no cough. She does have some chronic R LE edema.   She underwent PFT 05/25/13 > normal spirometry with restriction on volumes could suggest mixed disease.   04/24/13 --  COMPARISON: CT ANGIO CHEST W/CM &/OR WO/CM dated 07/01/2012  FINDINGS:  There is no focal parenchymal opacity, pleural effusion, or  pneumothorax. Stable cardiomegaly. Normal mediastinal contours.  The osseous structures are unremarkable.  IMPRESSION:  No active cardiopulmonary disease.   Review of Systems  Constitutional: Negative for fever and unexpected weight change.  HENT: Negative for congestion, dental problem, ear pain, nosebleeds, postnasal drip, rhinorrhea, sinus pressure, sneezing, sore throat and trouble swallowing.   Eyes: Negative for redness and itching.  Respiratory: Positive for shortness of breath. Negative for cough, chest tightness and wheezing.   Cardiovascular: Positive for leg swelling. Negative for palpitations.       Legs and ankles  Gastrointestinal: Negative for nausea and vomiting.  Genitourinary: Negative for dysuria.  Musculoskeletal: Positive for joint swelling.       Knee pain and stiffness  Skin: Negative for rash.  Neurological: Negative for headaches.  Hematological: Does not bruise/bleed easily.  Psychiatric/Behavioral: Negative for dysphoric mood. The patient is not nervous/anxious.     Past Medical History  Diagnosis Date  . DVT (deep venous thrombosis) 2006    hx of  . Hyperlipidemia   . PE (pulmonary embolism) 2006    hx of  . Hyperglycemia    . UTI (lower urinary tract infection)     hx  . Ectopic pregnancy with intrauterine pregnancy   . Chest pain     CLite with apical ischemia in 2006 - normal coronary arteries by [facility] in 12/2004;  Myoview 11/12:  Low risk stress nuclear study with a small, partially reversible apical defect most likely related to apical thinning; cannot R/O very mild apical ischemia.  EF: 75%   . Hiatal hernia   . CAD (coronary artery disease)      Family History  Problem Relation Age of Onset  . Heart failure Mother     died from  . Colon polyps Mother   . Diabetes Maternal Aunt     grandaughter     History   Social History  . Marital Status: Divorced    Spouse Name: N/A    Number of Children: N/A  . Years of Education: N/A   Occupational History  . [employer]    Social History Main Topics  . Smoking status: Former Smoker -- 1.00 packs/day for 50 years    Types: Cigarettes    Quit date: 11/21/2009  . Smokeless tobacco: Never Used  . Alcohol Use: No  . Drug Use: No  . Sexual Activity: Not Currently    Partners: Male   Other Topics Concern  . Not on file   Social History Narrative   Regular exercise           No Known Allergies   Outpatient Prescriptions Prior to Visit  Medication Sig Dispense  Refill  . aspirin EC 81 MG tablet Take 81 mg by mouth at bedtime.        Marland Kitchen CALCIUM-VITAMIN D PO Take 600 Units by mouth 2 (two) times daily.        . cholecalciferol (VITAMIN D-400) 400 UNITS TABS Take 400 Units by mouth 2 (two) times daily.       . CRESTOR 20 MG tablet TAKE ONE-HALF TABLET BY MOUTH ONCE DAILY  30 tablet  5  . furosemide (LASIX) 20 MG tablet TAKE ONE TABLET BY MOUTH ONCE DAILY  90 tablet  1  . Multiple Vitamin (MULTIVITAMIN PO) Take 1 tablet by mouth at bedtime.        No facility-administered medications prior to visit.    07/10/12 --  Study Conclusions Left ventricle: The cavity size was normal. There was mild concentric hypertrophy. Systolic  function was normal. The estimated ejection fraction was in the range of 55% to 60%. Wall motion was normal; there were no regional wall motion abnormalities. Doppler parameters are consistent with abnormal left ventricular relaxation (grade 1 diastolic dysfunction).      Objective:   Physical Exam Filed Vitals:   06/12/13 1542  BP: 118/64  Pulse: 65  Height: 5\' 3"  (1.6 m)  Weight: 176 lb 12.8 oz (80.196 kg)  SpO2: 99%   Gen: Pleasant, well-nourished, in no distress,  normal affect  ENT: No lesions,  mouth clear,  oropharynx clear, no postnasal drip  Neck: No JVD, no TMG, no carotid bruits  Lungs: No use of accessory muscles, no dullness to percussion, clear without rales or rhonchi  Cardiovascular: RRR, heart sounds normal, no murmur or gallops, no peripheral edema  Musculoskeletal: No deformities, no cyanosis or clubbing  Neuro: alert, non focal  Skin: Warm, no lesions or rashes     Assessment & Plan:  Dyspnea on effort 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 Suspect wt gain + her HH with superimposed deconditioning. Agree with cardiac stress testing - alternatively could get CPST. She is scheduled for stress testing on May 1. If reassuring then will need to work on deconditioning.     Your breathing tests suggest some restriction of your breath size. There is no significant evidence for COPD or asthma Get your cardiac stress test as planned. We will not order a cardiopulmonary stress test at this time, but may decide to do so in the future If your cardiac testing is normal then you will be cleared to work on your conditioning and weight loss.  Follow with Dr Lamonte Sakai in 3 months or sooner if you have any problems.

## 2013-06-12 NOTE — Assessment & Plan Note (Signed)
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 Suspect wt gain + her HH with superimposed deconditioning. Agree with cardiac stress testing - alternatively could get CPST. She is scheduled for stress testing on May 1. If reassuring then will need to work on deconditioning.     Your breathing tests suggest some restriction of your breath size. There is no significant evidence for COPD or asthma Get your cardiac stress test as planned. We will not order a cardiopulmonary stress test at this time, but may decide to do so in the future If your cardiac testing is normal then you will be cleared to work on your conditioning and weight loss.  Follow with Dr Lamonte Sakai in 3 months or sooner if you have any problems.

## 2013-06-12 NOTE — Patient Instructions (Signed)
Your breathing tests suggest some restriction of your breath size. There is no significant evidence for COPD or asthma Get your cardiac stress test as planned. We will not order a cardiopulmonary stress test at this time, but may decide to do so in the future If your cardiac testing is normal then you will be cleared to work on your conditioning and weight loss.  Follow with Dr Lamonte Sakai in 3 months or sooner if you have any problems.

## 2013-06-19 ENCOUNTER — Ambulatory Visit (INDEPENDENT_AMBULATORY_CARE_PROVIDER_SITE_OTHER): Payer: Medicare Other | Admitting: Nurse Practitioner

## 2013-06-19 VITALS — BP 116/70 | HR 70

## 2013-06-19 DIAGNOSIS — I5032 Chronic diastolic (congestive) heart failure: Secondary | ICD-10-CM

## 2013-06-19 DIAGNOSIS — R0609 Other forms of dyspnea: Secondary | ICD-10-CM

## 2013-06-19 DIAGNOSIS — R0989 Other specified symptoms and signs involving the circulatory and respiratory systems: Secondary | ICD-10-CM

## 2013-06-19 DIAGNOSIS — R0600 Dyspnea, unspecified: Secondary | ICD-10-CM

## 2013-06-19 NOTE — Progress Notes (Signed)
Exercise Treadmill Test  Pre-Exercise Testing Evaluation Rhythm: normal sinus  Rate: 72 bpm     Test  Exercise Tolerance Test Ordering MD: Marijo File, MD  Interpreting MD: Truitt Merle, NP  Unique Test No: 1  Treadmill:  1  Indication for ETT: exertional dyspnea  Contraindication to ETT: No   Stress Modality: exercise - treadmill  Cardiac Imaging Performed: non   Protocol: standard Bruce - maximal  Max BP:  217/91  Max MPHR (bpm):  149 85% MPR (bpm):  127  MPHR obtained (bpm):  134 % MPHR obtained:  89%  Reached 85% MPHR (min:sec):  4:30 Total Exercise Time (min-sec):  6 minutes  Workload in METS:  7.0 Borg Scale: 14  Reason ETT Terminated:  desired heart rate attained    ST Segment Analysis At Rest: normal ST segments - no evidence of significant ST depression With Exercise: no evidence of significant ST depression  Other Information Arrhythmia:  No Angina during ETT:  absent (0) Quality of ETT:  diagnostic  ETT Interpretation:  normal - no evidence of ischemia by ST analysis  Comments: Patient presents today for routine GXT. Has had HLD, DVT, prior PE, no CAD by cath in 2006, negative myoview in 8502 and diastolic HF. Has had worsening DOE and weight gain (had stopped exercising).   Today the patient exercised on the standard Bruce protocol for a total of 6 minutes.  Reduced exercise tolerance.  Adequate blood pressure response.  Clinically negative for chest pain. Test was stopped due to fatigue. No chest pain. Tried to obtain oxygen sat but not able to read due to fingernail polish.  EKG negative for ischemia. No significant arrhythmia noted.   Recommendations: Exercise and weight loss.   She seems motivated  Will see Dr. Lamonte Sakai back in 3 months. If she has persistent symptoms - she will need further testing.  Patient is agreeable to this plan and will call if any problems develop in the interim.   Burtis Junes, RN, [place] [address] [place], [place]  [phone]

## 2013-07-07 ENCOUNTER — Encounter: Payer: Self-pay | Admitting: Cardiology

## 2013-07-07 ENCOUNTER — Ambulatory Visit (INDEPENDENT_AMBULATORY_CARE_PROVIDER_SITE_OTHER): Payer: Medicare Other | Admitting: Cardiology

## 2013-07-07 VITALS — BP 98/62 | HR 66 | Ht 62.0 in | Wt 173.0 lb

## 2013-07-07 DIAGNOSIS — I6529 Occlusion and stenosis of unspecified carotid artery: Secondary | ICD-10-CM | POA: Diagnosis not present

## 2013-07-07 DIAGNOSIS — I519 Heart disease, unspecified: Secondary | ICD-10-CM

## 2013-07-07 NOTE — Patient Instructions (Signed)
Your physician recommends that you continue on your current medications as directed. Please refer to the Current Medication list given to you today.  Your physician wants you to follow-up in: 1 year ov You will receive a reminder letter in the mail two months in advance. If you don't receive a letter, please call our office to schedule the follow-up appointment.  

## 2013-07-07 NOTE — Progress Notes (Signed)
HPI The patient presents for evaluation of dyspnea.  I have seen her in the past with an abnormal echo.  . She has had some right greater than left lower extremity swelling. Because of a previous history of pulmonary emboli she did have lower extremity Doppler prior to our first  Visit which demonstrated no DVT and doesn't mention any venous insufficiency . She was sent for an echocardiogram which demonstrated a well preserved ejection fraction. There was however mention of some mild diastolic dysfunction.    She recently saw Truitt Merle NP and had an unremarkable exercise treadmill test. BNP level was 28. This was done to evaluate dyspnea with exertion. She subsequently has seen Dr. Lamonte Sakai and has some questionable restrictive disease on pulmonary function testing thought maybe related to some deconditioning. She says she'll get short of breath if she climbs an incline. She otherwise is doing treadmill exercise routinely and on level ground isn't getting any shortness of breath. She's not getting any PND or orthopnea. She's not having any chest pressure, neck or arm discomfort. She has no palpitations, presyncope or syncope.   No Known Allergies  Current Outpatient Prescriptions  Medication Sig Dispense Refill  . aspirin EC 81 MG tablet Take 81 mg by mouth at bedtime.        Marland Kitchen CALCIUM-VITAMIN D PO Take 600 Units by mouth 2 (two) times daily.        . cholecalciferol (VITAMIN D-400) 400 UNITS TABS Take 400 Units by mouth 2 (two) times daily.       . CRESTOR 20 MG tablet TAKE ONE-HALF TABLET BY MOUTH ONCE DAILY  30 tablet  5  . furosemide (LASIX) 20 MG tablet TAKE ONE TABLET BY MOUTH ONCE DAILY  90 tablet  1  . Multiple Vitamin (MULTIVITAMIN PO) Take 1 tablet by mouth at bedtime.       . [DISCONTINUED] omega-3 acid ethyl esters (LOVAZA) 1 G capsule Take 1 g by mouth 3 (three) times daily.        . [DISCONTINUED] pravastatin (PRAVACHOL) 20 MG tablet Take 20 mg by mouth daily.         No current  facility-administered medications for this visit.    Past Medical History  Diagnosis Date  . DVT (deep venous thrombosis) 2006    hx of  . Hyperlipidemia   . PE (pulmonary embolism) 2006    hx of  . Hyperglycemia   . UTI (lower urinary tract infection)     hx  . Ectopic pregnancy with intrauterine pregnancy   . Chest pain     CLite with apical ischemia in 2006 - normal coronary arteries by [facility] in 12/2004;  Myoview 11/12:  Low risk stress nuclear study with a small, partially reversible apical defect most likely related to apical thinning; cannot R/O very mild apical ischemia.  EF: 75%   . Hiatal hernia   . CAD (coronary artery disease)     Past Surgical History  Procedure Laterality Date  . Foot surgery    . Abdominal exploration surgery    . Ectopic pregnancy surgery    . Bunionectomy      right    ROS:  As stated in the HPI and negative for all other systems.  PHYSICAL EXAM BP 98/62  Pulse 66  Ht 5\' 2"  (1.575 m)  Wt 173 lb (78.472 kg)  BMI 31.63 kg/m2 GENERAL:  Well appearing NECK:  No jugular venous distention, waveform within normal limits, carotid upstroke brisk and  symmetric, no bruits, no thyromegaly LUNGS:  Clear to auscultation bilaterally BACK:  No CVA tenderness CHEST:  Unremarkable HEART:  PMI not displaced or sustained,S1 and S2 within normal limits, no S3, no S4, no clicks, no rubs, no murmurs ABD:  Flat, positive bowel sounds normal in frequency in pitch, no bruits, no rebound, no guarding, no midline pulsatile mass, no hepatomegaly, no splenomegaly EXT:  2 plus pulses throughout, trace edema, no cyanosis no clubbing   ASSESSMENT AND PLAN  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  I do not believe that her breathing is related to this.  BNP was normal.  There is no evidence of ischemia.  No further cardiac work up is indicated.   EDEMA:  This has been mild.  No further work up is indicated.

## 2013-09-24 ENCOUNTER — Ambulatory Visit (INDEPENDENT_AMBULATORY_CARE_PROVIDER_SITE_OTHER): Payer: Medicare Other | Admitting: Emergency Medicine

## 2013-09-24 ENCOUNTER — Encounter: Payer: Self-pay | Admitting: Emergency Medicine

## 2013-09-24 VITALS — BP 92/60 | HR 68 | Temp 97.2°F | Ht 62.0 in | Wt 174.2 lb

## 2013-09-24 DIAGNOSIS — R0989 Other specified symptoms and signs involving the circulatory and respiratory systems: Secondary | ICD-10-CM

## 2013-09-24 DIAGNOSIS — R0609 Other forms of dyspnea: Secondary | ICD-10-CM

## 2013-09-24 DIAGNOSIS — I6529 Occlusion and stenosis of unspecified carotid artery: Secondary | ICD-10-CM

## 2013-09-24 NOTE — Progress Notes (Signed)
Subjective:    Patient ID: Sierra Ford, female    DOB: 08/18/1940, 73 y.o.   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 She is referred by Dr Etter Sjogren for evaluation of exertional SOB. She has been an active person, exercises. She notes that she has had some wt gain about 50 lbs over the last 5 years.  No hx asthma, no wheeze, no CP, no cough. She does have some chronic R LE edema.   She underwent PFT 05/25/13 > normal spirometry with restriction on volumes could suggest mixed disease.   ROV 09/24/13 -- follow up for dyspnea and mixed disease on spirometry. She underwent cardiac eval and treadmill testing. No evidence ischemia. Her TTE showed diastolic dysfxn. She has been working on exercise and is walking on treadmill. Her dyspnea is better.    04/24/13 --  COMPARISON: CT ANGIO CHEST W/CM &/OR WO/CM dated 07/01/2012  FINDINGS:  There is no focal parenchymal opacity, pleural effusion, or  pneumothorax. Stable cardiomegaly. Normal mediastinal contours.  The osseous structures are unremarkable.  IMPRESSION:  No active cardiopulmonary disease.   Review of Systems  Constitutional: Negative for fever and unexpected weight change.  HENT: Negative for congestion, dental problem, ear pain, nosebleeds, postnasal drip, rhinorrhea, sinus pressure, sneezing, sore throat and trouble swallowing.   Eyes: Negative for redness and itching.  Respiratory: Positive for shortness of breath. Negative for cough, chest tightness and wheezing.   Cardiovascular: Positive for leg swelling. Negative for palpitations.       Legs and ankles  Gastrointestinal: Negative for nausea and vomiting.  Genitourinary: Negative for dysuria.  Musculoskeletal: Positive for joint swelling.       Knee pain and stiffness  Skin: Negative for rash.  Neurological: Negative for headaches.  Hematological: Does not bruise/bleed easily.   Psychiatric/Behavioral: Negative for dysphoric mood. The patient is not nervous/anxious.    07/10/12 --  Study Conclusions Left ventricle: The cavity size was normal. There was mild concentric hypertrophy. Systolic function was normal. The estimated ejection fraction was in the range of 55% to 60%. Wall motion was normal; there were no regional wall motion abnormalities. Doppler parameters are consistent with abnormal left ventricular relaxation (grade 1 diastolic dysfunction).      Objective:   Physical Exam Filed Vitals:   09/24/13 1605  BP: 92/60  Pulse: 68  Temp: 97.2 F (36.2 C)  TempSrc: Oral  Height: 5\' 2"  (1.575 m)  Weight: 174 lb 3.2 oz (79.017 kg)  SpO2: 98%   Gen: Pleasant, well-nourished, in no distress,  normal affect  ENT: No lesions,  mouth clear,  oropharynx clear, no postnasal drip  Neck: No JVD, no TMG, no carotid bruits  Lungs: No use of accessory muscles, no dullness to percussion, clear without rales or rhonchi  Cardiovascular: RRR, heart sounds normal, no murmur or gallops, no peripheral edema  Musculoskeletal: No deformities, no cyanosis or clubbing  Neuro: alert, non focal  Skin: Warm, no lesions or rashes     Assessment & Plan:  Dyspnea on effort Multifactorial - but I do believe that she has a component of COPD. She is better since starting to exercise. At this point she would like to defer any bronchodilators. I am ok with this plan. If she has residual SOB or if she worsens then I believe she should do a trial of Spiriva to see if she benefits.Marland Kitchen

## 2013-09-24 NOTE — Assessment & Plan Note (Signed)
Multifactorial - but I do believe that she has a component of COPD. She is better since starting to exercise. At this point she would like to defer any bronchodilators. I am ok with this plan. If she has residual SOB or if she worsens then I believe she should do a trial of Spiriva to see if she benefits.Marland Kitchen

## 2013-09-24 NOTE — Patient Instructions (Signed)
We will revisit whether to try an inhaler at some point in the future if your breathing remains problematic.  Continue your good work on exercise and weight loss.  Follow with Dr Lamonte Sakai in 6 months or sooner if you have any problems

## 2013-10-21 ENCOUNTER — Other Ambulatory Visit (INDEPENDENT_AMBULATORY_CARE_PROVIDER_SITE_OTHER): Payer: Medicare Other

## 2013-10-21 ENCOUNTER — Other Ambulatory Visit: Payer: Self-pay | Admitting: *Deleted

## 2013-10-21 DIAGNOSIS — E785 Hyperlipidemia, unspecified: Secondary | ICD-10-CM

## 2013-10-21 LAB — HEPATIC FUNCTION PANEL
ALBUMIN: 4.1 g/dL (ref 3.5–5.2)
ALT: 24 U/L (ref 0–35)
AST: 26 U/L (ref 0–37)
Alkaline Phosphatase: 72 U/L (ref 39–117)
Bilirubin, Direct: 0.1 mg/dL (ref 0.0–0.3)
TOTAL PROTEIN: 7.2 g/dL (ref 6.0–8.3)
Total Bilirubin: 1.2 mg/dL (ref 0.2–1.2)

## 2013-10-21 LAB — LIPID PANEL
Cholesterol: 220 mg/dL — ABNORMAL HIGH (ref 0–200)
HDL: 101.6 mg/dL (ref >39.00)
LDL Cholesterol: 104 mg/dL — ABNORMAL HIGH (ref 0–99)
NonHDL: 118.4
TRIGLYCERIDES: 72 mg/dL (ref 0.0–149.0)
Total CHOL/HDL Ratio: 2
VLDL: 14.4 mg/dL (ref 0.0–40.0)

## 2013-10-22 ENCOUNTER — Ambulatory Visit (INDEPENDENT_AMBULATORY_CARE_PROVIDER_SITE_OTHER): Payer: Medicare Other | Admitting: Pharmacist

## 2013-10-22 VITALS — Wt 173.0 lb

## 2013-10-22 DIAGNOSIS — E785 Hyperlipidemia, unspecified: Secondary | ICD-10-CM | POA: Diagnosis not present

## 2013-10-22 DIAGNOSIS — I6529 Occlusion and stenosis of unspecified carotid artery: Secondary | ICD-10-CM

## 2013-10-22 DIAGNOSIS — Z79899 Other long term (current) drug therapy: Secondary | ICD-10-CM | POA: Diagnosis not present

## 2013-10-22 NOTE — Assessment & Plan Note (Addendum)
Patient tolerating Crestor 10 mg qd, and LDL is trending down.  LDL 104, TC 220, TG 72, HDL 102, LFTs normal.  Given LDL ~ 100 mg/dL, and she is tolerating regimen well, and failed other statins in the past, will not make any changes.  Given she has some evidence of mild-moderate carotid plaque on ultrasound would like to keep LDL 100 mg/dL or less.  Will have patient limit her eating out and reduce her ice cream / milk shakes, and recheck lipid / liver in 6 months.  She would like to continue to follow up to see [name] in lipid clinic after lab for accountability reasons. Plan: 1.  Continue exercising 6 days per week. 2.  Try to limit desserts to no more than 3 days per week.   3.  Continue daily Crestor. 4.  Recheck cholesterol and liver in 6 months (04/28/14 fasting labs  ) and see Gay Filler next day (04/29/14 at 10:30 am)

## 2013-10-22 NOTE — Patient Instructions (Signed)
1.  Continue exercising 6 days per week. 2.  Try to limit desserts to no more than 3 days per week.   3.  Continue daily Crestor. 4.  Recheck cholesterol and liver in 6 months (04/28/14 fasting labs  ) and see Gay Filler next day (04/29/14 at 10:30 am)

## 2013-10-22 NOTE — Progress Notes (Signed)
Ms. Rodenberg arrives in good spirits, for 2-month hyperlipidemia follow-up. She is a long standing patient of ours, with a history of having been on statins (including atorvastatin, pravastatin) and lovaza, but with myalgias. Currently controlled on crestor 10mg . LDL in 2013, 197 > 04/22/13 109. She says she "has been bad lately" and eating more ice cream and milk shakes 3 times a week.  No complaints with Crestor as of now.  She is drinking 4 glasses of water daily and this is keeping her from cramping.    Diet In that past, has been following a nutrition book by Chief Strategy Officer Dr. Tamala Julian, which encouraged more frequent, moderate meals and juicing, but recently has not been doing so Breakfast - cereal or skips Lunch - eats a salad with fruit daily Dinner - typically eats chicken or fish, kale, salmon/tilapia, loves spinach Snacks - Patient loves icecream and will eat ~ once/week  Exercise Enjoys exercising 30 min on the treadmill at home 6x a week. Pt appears dedicated to continue.  Current Outpatient Prescriptions  Medication Sig Dispense Refill  . aspirin EC 81 MG tablet Take 81 mg by mouth at bedtime.        Marland Kitchen CALCIUM-VITAMIN D PO Take 600 Units by mouth 2 (two) times daily.        . cholecalciferol (VITAMIN D-400) 400 UNITS TABS Take 400 Units by mouth 2 (two) times daily.       . furosemide (LASIX) 20 MG tablet TAKE ONE TABLET BY MOUTH ONCE DAILY  90 tablet  1  . Multiple Vitamin (MULTIVITAMIN PO) Take 1 tablet by mouth at bedtime.       . rosuvastatin (CRESTOR) 20 MG tablet TAKE HALF TABLET BY MOUTH ONCE DAILY      . [DISCONTINUED] omega-3 acid ethyl esters (LOVAZA) 1 G capsule Take 1 g by mouth 3 (three) times daily.        . [DISCONTINUED] pravastatin (PRAVACHOL) 20 MG tablet Take 20 mg by mouth daily.         No current facility-administered medications for this visit.   No Known Allergies  Family History  Problem Relation Age of Onset  . Heart failure Mother     died from  . Colon  polyps Mother   . Diabetes Maternal Aunt     grandaughter

## 2013-12-31 ENCOUNTER — Other Ambulatory Visit: Payer: Self-pay | Admitting: Obstetrics & Gynecology

## 2013-12-31 DIAGNOSIS — N952 Postmenopausal atrophic vaginitis: Secondary | ICD-10-CM | POA: Diagnosis not present

## 2013-12-31 DIAGNOSIS — Z124 Encounter for screening for malignant neoplasm of cervix: Secondary | ICD-10-CM | POA: Diagnosis not present

## 2013-12-31 DIAGNOSIS — Z1231 Encounter for screening mammogram for malignant neoplasm of breast: Secondary | ICD-10-CM | POA: Diagnosis not present

## 2014-01-01 ENCOUNTER — Telehealth: Payer: Self-pay | Admitting: Family Medicine

## 2014-01-01 DIAGNOSIS — I10 Essential (primary) hypertension: Secondary | ICD-10-CM

## 2014-01-01 LAB — CYTOLOGY - PAP

## 2014-01-01 MED ORDER — FUROSEMIDE 20 MG PO TABS: ORAL_TABLET | ORAL | Status: DC

## 2014-01-01 NOTE — Telephone Encounter (Signed)
Patient also called in wanting a refill of furosemide sent to [facility] on [address]

## 2014-01-01 NOTE — Telephone Encounter (Signed)
Caller name: Shalita Relation to pt: self Call back number: [phone] Pharmacy:  Reason for call:   Patient would like to know when her last colonoscopy was?

## 2014-01-01 NOTE — Telephone Encounter (Signed)
Msg left to call the office     KP 

## 2014-01-04 NOTE — Telephone Encounter (Signed)
Pt states you can leave a detail message on home voicemail.

## 2014-01-04 NOTE — Telephone Encounter (Addendum)
The last colonoscopy was done on 03/04/2007. I left another message for a call back. Per her chart she is not due again until 2019.       KP

## 2014-01-04 NOTE — Telephone Encounter (Signed)
Pt informed

## 2014-01-27 ENCOUNTER — Other Ambulatory Visit: Payer: Medicare Other

## 2014-01-28 ENCOUNTER — Ambulatory Visit: Payer: Medicare Other | Admitting: Pharmacist

## 2014-03-01 DIAGNOSIS — D239 Other benign neoplasm of skin, unspecified: Secondary | ICD-10-CM | POA: Diagnosis not present

## 2014-03-01 DIAGNOSIS — L821 Other seborrheic keratosis: Secondary | ICD-10-CM | POA: Diagnosis not present

## 2014-04-05 ENCOUNTER — Encounter: Payer: Medicare Other | Admitting: Family Medicine

## 2014-04-27 ENCOUNTER — Other Ambulatory Visit (INDEPENDENT_AMBULATORY_CARE_PROVIDER_SITE_OTHER): Payer: Medicare Other

## 2014-04-27 DIAGNOSIS — Z79899 Other long term (current) drug therapy: Secondary | ICD-10-CM

## 2014-04-27 DIAGNOSIS — E785 Hyperlipidemia, unspecified: Secondary | ICD-10-CM | POA: Diagnosis not present

## 2014-04-27 LAB — LIPID PANEL
CHOL/HDL RATIO: 2
Cholesterol: 225 mg/dL — ABNORMAL HIGH (ref 0–200)
HDL: 100.9 mg/dL (ref >39.00)
LDL Cholesterol: 110 mg/dL — ABNORMAL HIGH (ref 0–99)
NONHDL: 124.1
TRIGLYCERIDES: 70 mg/dL (ref 0.0–149.0)
VLDL: 14 mg/dL (ref 0.0–40.0)

## 2014-04-27 LAB — HEPATIC FUNCTION PANEL
ALT: 23 U/L (ref 0–35)
AST: 23 U/L (ref 0–37)
Albumin: 4.3 g/dL (ref 3.5–5.2)
Alkaline Phosphatase: 82 U/L (ref 39–117)
Bilirubin, Direct: 0.1 mg/dL (ref 0.0–0.3)
Total Bilirubin: 0.9 mg/dL (ref 0.2–1.2)
Total Protein: 7.3 g/dL (ref 6.0–8.3)

## 2014-04-28 ENCOUNTER — Other Ambulatory Visit: Payer: Medicare Other

## 2014-04-28 ENCOUNTER — Ambulatory Visit (INDEPENDENT_AMBULATORY_CARE_PROVIDER_SITE_OTHER): Payer: Medicare Other | Admitting: Pharmacist

## 2014-04-28 VITALS — Wt 182.0 lb

## 2014-04-28 DIAGNOSIS — E785 Hyperlipidemia, unspecified: Secondary | ICD-10-CM

## 2014-04-28 MED ORDER — ROSUVASTATIN CALCIUM 20 MG PO TABS: ORAL_TABLET | ORAL | Status: DC

## 2014-04-28 MED ORDER — EZETIMIBE 10 MG PO TABS: ORAL_TABLET | ORAL | Status: DC

## 2014-04-28 NOTE — Progress Notes (Signed)
S/O: Ms. Verrilli arrives in good spirits for a 20-month hyperlipidemia follow-up. She is a longstanding patient of ours, with a history of having been on statins (atorvastatin, pravastatin) and Lovaza, but did not tolerate these due to myalgias. Currently taking Crestor 10mg  daily and tolerating it well.   Diet:  - Patient states that she has 2 diet books and a juicer. She usually eats 2 meals a day.  - On her best day, she will have a spinach salad for lunch with fruit on it. For dinner, she will have salmon or tilapia, greens, and beets. She likes dairy a lot but looks for skim or 2% fat products. - On her worst day, she will eat out and have either fried chicken, BBQ ribs, or an [employer] corned beef sandwich. -States that she eats out at restaurants ~2 times per week. She cooks healthy foods for herself when she stays at home to eat.  - States that she loves ice cream and usually eats this at least 3 times a week (either [employer] milkshakes or [employer]).  Exercise:  - Spends 30 minutes a day walking on the treadmill 4-5 times per week  - Patient has gained ~10 pounds since we saw her 6 months ago (up to 182 lbs today). States her ideal and goal weight is 155-160 lbs. Knows she needs to keep exercising and work on eating healthier for this to happen.  Lipid Panel: (on Crestor 10mg  daily)    Component Value Date/Time   CHOL 225* 04/27/2014 0911   TRIG 70.0 04/27/2014 0911   HDL 100.90 04/27/2014 0911   CHOLHDL 2 04/27/2014 0911   VLDL 14.0 04/27/2014 0911   LDLCALC 110* 04/27/2014 0911   LDLDIRECT 95.9 10/06/2012 1248    Outpatient Encounter Prescriptions as of 04/28/2014  Medication Sig  . aspirin EC 81 MG tablet Take 81 mg by mouth at bedtime.    Marland Kitchen CALCIUM-VITAMIN D PO Take 600 Units by mouth 2 (two) times daily.    . cholecalciferol (VITAMIN D-400) 400 UNITS TABS Take 400 Units by mouth 2 (two) times daily.   . furosemide (LASIX) 20 MG tablet TAKE ONE TABLET BY MOUTH ONCE DAILY  .  Multiple Vitamin (MULTIVITAMIN PO) Take 1 tablet by mouth at bedtime.   . rosuvastatin (CRESTOR) 20 MG tablet Take 1/2 to 1 tablet daily or as directed.  . [DISCONTINUED] rosuvastatin (CRESTOR) 20 MG tablet TAKE HALF TABLET BY MOUTH ONCE DAILY  . ezetimibe (ZETIA) 10 MG tablet Take 1/2 to 1 tablet daily or as directed.   A/P: Patient with hyperlipidemia and stable lipid panel tolerating Crestor 10mg  daily. LDL increased from 104 to 110 in the past 6 months. Patient willing to try Zetia 5mg  daily in addition to continuing her Crestor 10mg  daily. Provided with a sample of Zetia and new prescriptions for her Crestor and Zetia sent to the pharmacy. Encouraged patient to call us if she does not tolerate Zetia. Discussed diet and lifestyle modifications today as well. Read through nutrition labels for ice cream during today's visit--patient will look for low-fat options since this is one of her main snacks. She will also work on shifting some of her exercise to after her evening meal. Patient is determined to lose weight and listed a goal weight for herself that is 25 pounds under current weight. Will follow-up with patient in 6 months in lipid clinic.  Margareth Kanner E. Tynasia Mccaul, Pharm.D Clinical Pharmacy Resident Pager: [phone] 04/28/2014 10:00 AM

## 2014-04-28 NOTE — Patient Instructions (Signed)
It was nice to see you in clinic today  - We will send a refill of your Crestor 10mg  daily to your pharmacy - We will also send a prescription for Zetia 5mg  daily to start taking to help with your cholesterol - Keep up the great work with your exercising. Try to be active and walk around a bit after your evening meal - Continue to cook food at home and try to limit eating meals out at restaurants - Look for low fat dairy options at the grocery store - Give [name] a call if you find that you cannot tolerate your Zetia - We will see you again in 6 months--come in to have your blood work done on Tuesday, September 6. We will see you in the pharmacy clinic on Wednesday, September 7 at 10am.

## 2014-04-29 ENCOUNTER — Ambulatory Visit: Payer: Medicare Other | Admitting: Pharmacist

## 2014-05-06 ENCOUNTER — Telehealth: Payer: Self-pay | Admitting: Pharmacist

## 2014-05-06 ENCOUNTER — Telehealth: Payer: Self-pay | Admitting: *Deleted

## 2014-05-06 NOTE — Telephone Encounter (Signed)
Zetia samples placed at the front desk for patient. 

## 2014-05-19 ENCOUNTER — Encounter: Payer: Self-pay | Admitting: *Deleted

## 2014-05-19 ENCOUNTER — Telehealth: Payer: Self-pay | Admitting: *Deleted

## 2014-05-19 NOTE — Telephone Encounter (Signed)
Pre-Visit Call completed with patient and chart updated.   Pre-Visit Info documented in Specialty Comments under [employer].    

## 2014-05-20 ENCOUNTER — Other Ambulatory Visit: Payer: Self-pay | Admitting: Family Medicine

## 2014-05-20 ENCOUNTER — Encounter: Payer: Medicare Other | Admitting: Family Medicine

## 2014-05-20 ENCOUNTER — Encounter: Payer: Self-pay | Admitting: Family Medicine

## 2014-05-20 ENCOUNTER — Ambulatory Visit (INDEPENDENT_AMBULATORY_CARE_PROVIDER_SITE_OTHER): Payer: Medicare Other | Admitting: Family Medicine

## 2014-05-20 VITALS — BP 114/76 | HR 77 | Temp 98.0°F | Ht 64.5 in | Wt 183.2 lb

## 2014-05-20 DIAGNOSIS — I10 Essential (primary) hypertension: Secondary | ICD-10-CM

## 2014-05-20 DIAGNOSIS — Z833 Family history of diabetes mellitus: Secondary | ICD-10-CM

## 2014-05-20 DIAGNOSIS — Z0000 Encounter for general adult medical examination without abnormal findings: Secondary | ICD-10-CM | POA: Diagnosis not present

## 2014-05-20 DIAGNOSIS — E785 Hyperlipidemia, unspecified: Secondary | ICD-10-CM | POA: Diagnosis not present

## 2014-05-20 LAB — CBC WITH DIFFERENTIAL/PLATELET
BASOS PCT: 0.6 % (ref 0.0–3.0)
Basophils Absolute: 0 10*3/uL (ref 0.0–0.1)
EOS ABS: 0.2 10*3/uL (ref 0.0–0.7)
Eosinophils Relative: 3 % (ref 0.0–5.0)
HEMATOCRIT: 43.6 % (ref 36.0–46.0)
HEMOGLOBIN: 14.9 g/dL (ref 12.0–15.0)
LYMPHS ABS: 2.4 10*3/uL (ref 0.7–4.0)
LYMPHS PCT: 45.1 % (ref 12.0–46.0)
MCHC: 34.3 g/dL (ref 30.0–36.0)
MCV: 91.1 fl (ref 78.0–100.0)
Monocytes Absolute: 0.5 10*3/uL (ref 0.1–1.0)
Monocytes Relative: 8.5 % (ref 3.0–12.0)
NEUTROS ABS: 2.3 10*3/uL (ref 1.4–7.7)
Neutrophils Relative %: 42.8 % — ABNORMAL LOW (ref 43.0–77.0)
Platelets: 252 10*3/uL (ref 150.0–400.0)
RBC: 4.78 Mil/uL (ref 3.87–5.11)
RDW: 13.7 % (ref 11.5–15.5)
WBC: 5.3 10*3/uL (ref 4.0–10.5)

## 2014-05-20 LAB — HEPATIC FUNCTION PANEL
ALK PHOS: 82 U/L (ref 39–117)
ALT: 29 U/L (ref 0–35)
AST: 32 U/L (ref 0–37)
Albumin: 4.4 g/dL (ref 3.5–5.2)
Bilirubin, Direct: 0.3 mg/dL (ref 0.0–0.3)
TOTAL PROTEIN: 7.5 g/dL (ref 6.0–8.3)
Total Bilirubin: 1.3 mg/dL — ABNORMAL HIGH (ref 0.2–1.2)

## 2014-05-20 LAB — POCT URINALYSIS DIPSTICK
Bilirubin, UA: NEGATIVE
Blood, UA: NEGATIVE
GLUCOSE UA: NEGATIVE
Ketones, UA: NEGATIVE
LEUKOCYTES UA: NEGATIVE
NITRITE UA: NEGATIVE
PROTEIN UA: NEGATIVE
Spec Grav, UA: 1.015
UROBILINOGEN UA: 0.2
pH, UA: 6

## 2014-05-20 LAB — BASIC METABOLIC PANEL
BUN: 22 mg/dL (ref 6–23)
CO2: 28 mEq/L (ref 19–32)
Calcium: 10.6 mg/dL — ABNORMAL HIGH (ref 8.4–10.5)
Chloride: 99 mEq/L (ref 96–112)
Creatinine, Ser: 1.01 mg/dL (ref 0.40–1.20)
GFR: 69.16 mL/min (ref >60.00)
Glucose, Bld: 104 mg/dL — ABNORMAL HIGH (ref 70–99)
POTASSIUM: 3.9 meq/L (ref 3.5–5.1)
SODIUM: 135 meq/L (ref 135–145)

## 2014-05-20 LAB — MICROALBUMIN / CREATININE URINE RATIO
Creatinine,U: 97 mg/dL
Microalb Creat Ratio: 0.7 mg/g (ref 0.0–30.0)
Microalb, Ur: <0.7 mg/dL (ref 0.0–1.9)

## 2014-05-20 LAB — LIPID PANEL
CHOL/HDL RATIO: 2
Cholesterol: 210 mg/dL — ABNORMAL HIGH (ref 0–200)
HDL: 96.9 mg/dL (ref >39.00)
LDL CALC: 97 mg/dL (ref 0–99)
NonHDL: 113.1
Triglycerides: 79 mg/dL (ref 0.0–149.0)
VLDL: 15.8 mg/dL (ref 0.0–40.0)

## 2014-05-20 MED ORDER — FUROSEMIDE 20 MG PO TABS: ORAL_TABLET | ORAL | Status: DC

## 2014-05-20 NOTE — Patient Instructions (Signed)
Preventive Care for Adults A healthy lifestyle and preventive care can promote health and wellness. Preventive health guidelines for women include the following key practices.  A routine yearly physical is a good way to check with your health care provider about your health and preventive screening. It is a chance to share any concerns and updates on your health and to receive a thorough exam.  Visit your dentist for a routine exam and preventive care every 6 months. Brush your teeth twice a day and floss once a day. Good oral hygiene prevents tooth decay and gum disease.  The frequency of eye exams is based on your age, health, family medical history, use of contact lenses, and other factors. Follow your health care provider's recommendations for frequency of eye exams.  Eat a healthy diet. Foods like vegetables, fruits, whole grains, low-fat dairy products, and lean protein foods contain the nutrients you need without too many calories. Decrease your intake of foods high in solid fats, added sugars, and salt. Eat the right amount of calories for you.Get information about a proper diet from your health care provider, if necessary.  Regular physical exercise is one of the most important things you can do for your health. Most adults should get at least 150 minutes of moderate-intensity exercise (any activity that increases your heart rate and causes you to sweat) each week. In addition, most adults need muscle-strengthening exercises on 2 or more days a week.  Maintain a healthy weight. The body mass index (BMI) is a screening tool to identify possible weight problems. It provides an estimate of body fat based on height and weight. Your health care provider can find your BMI and can help you achieve or maintain a healthy weight.For adults 20 years and older:  A BMI below 18.5 is considered underweight.  A BMI of 18.5 to 24.9 is normal.  A BMI of 25 to 29.9 is considered overweight.  A BMI of  30 and above is considered obese.  Maintain normal blood lipids and cholesterol levels by exercising and minimizing your intake of saturated fat. Eat a balanced diet with plenty of fruit and vegetables. Blood tests for lipids and cholesterol should begin at age 76 and be repeated every 5 years. If your lipid or cholesterol levels are high, you are over 50, or you are at high risk for heart disease, you may need your cholesterol levels checked more frequently.Ongoing high lipid and cholesterol levels should be treated with medicines if diet and exercise are not working.  If you smoke, find out from your health care provider how to quit. If you do not use tobacco, do not start.  Lung cancer screening is recommended for adults aged 22-80 years who are at high risk for developing lung cancer because of a history of smoking. A yearly low-dose CT scan of the lungs is recommended for people who have at least a 30-pack-year history of smoking and are a current smoker or have quit within the past 15 years. A pack year of smoking is smoking an average of 1 pack of cigarettes a day for 1 year (for example: 1 pack a day for 30 years or 2 packs a day for 15 years). Yearly screening should continue until the smoker has stopped smoking for at least 15 years. Yearly screening should be stopped for people who develop a health problem that would prevent them from having lung cancer treatment.  If you are pregnant, do not drink alcohol. If you are breastfeeding,  be very cautious about drinking alcohol. If you are not pregnant and choose to drink alcohol, do not have more than 1 drink per day. One drink is considered to be 12 ounces (355 mL) of beer, 5 ounces (148 mL) of wine, or 1.5 ounces (44 mL) of liquor.  Avoid use of street drugs. Do not share needles with anyone. Ask for help if you need support or instructions about stopping the use of drugs.  High blood pressure causes heart disease and increases the risk of  stroke. Your blood pressure should be checked at least every 1 to 2 years. Ongoing high blood pressure should be treated with medicines if weight loss and exercise do not work.  If you are 3-86 years old, ask your health care provider if you should take aspirin to prevent strokes.  Diabetes screening involves taking a blood sample to check your fasting blood sugar level. This should be done once every 3 years, after age 67, if you are within normal weight and without risk factors for diabetes. Testing should be considered at a younger age or be carried out more frequently if you are overweight and have at least 1 risk factor for diabetes.  Breast cancer screening is essential preventive care for women. You should practice "breast self-awareness." This means understanding the normal appearance and feel of your breasts and may include breast self-examination. Any changes detected, no matter how small, should be reported to a health care provider. Women in their 8s and 30s should have a clinical breast exam (CBE) by a health care provider as part of a regular health exam every 1 to 3 years. After age 70, women should have a CBE every year. Starting at age 25, women should consider having a mammogram (breast X-ray test) every year. Women who have a family history of breast cancer should talk to their health care provider about genetic screening. Women at a high risk of breast cancer should talk to their health care providers about having an MRI and a mammogram every year.  Breast cancer gene (BRCA)-related cancer risk assessment is recommended for women who have family members with BRCA-related cancers. BRCA-related cancers include breast, ovarian, tubal, and peritoneal cancers. Having family members with these cancers may be associated with an increased risk for harmful changes (mutations) in the breast cancer genes BRCA1 and BRCA2. Results of the assessment will determine the need for genetic counseling and  BRCA1 and BRCA2 testing.  Routine pelvic exams to screen for cancer are no longer recommended for nonpregnant women who are considered low risk for cancer of the pelvic organs (ovaries, uterus, and vagina) and who do not have symptoms. Ask your health care provider if a screening pelvic exam is right for you.  If you have had past treatment for cervical cancer or a condition that could lead to cancer, you need Pap tests and screening for cancer for at least 20 years after your treatment. If Pap tests have been discontinued, your risk factors (such as having a new sexual partner) need to be reassessed to determine if screening should be resumed. Some women have medical problems that increase the chance of getting cervical cancer. In these cases, your health care provider may recommend more frequent screening and Pap tests.  The HPV test is an additional test that may be used for cervical cancer screening. The HPV test looks for the virus that can cause the cell changes on the cervix. The cells collected during the Pap test can be  tested for HPV. The HPV test could be used to screen women aged 30 years and older, and should be used in women of any age who have unclear Pap test results. After the age of 30, women should have HPV testing at the same frequency as a Pap test.  Colorectal cancer can be detected and often prevented. Most routine colorectal cancer screening begins at the age of 50 years and continues through age 75 years. However, your health care provider may recommend screening at an earlier age if you have risk factors for colon cancer. On a yearly basis, your health care provider may provide home test kits to check for hidden blood in the stool. Use of a small camera at the end of a tube, to directly examine the colon (sigmoidoscopy or colonoscopy), can detect the earliest forms of colorectal cancer. Talk to your health care provider about this at age 50, when routine screening begins. Direct  exam of the colon should be repeated every 5-10 years through age 75 years, unless early forms of pre-cancerous polyps or small growths are found.  People who are at an increased risk for hepatitis B should be screened for this virus. You are considered at high risk for hepatitis B if:  You were born in a country where hepatitis B occurs often. Talk with your health care provider about which countries are considered high risk.  Your parents were born in a high-risk country and you have not received a shot to protect against hepatitis B (hepatitis B vaccine).  You have HIV or AIDS.  You use needles to inject street drugs.  You live with, or have sex with, someone who has hepatitis B.  You get hemodialysis treatment.  You take certain medicines for conditions like cancer, organ transplantation, and autoimmune conditions.  Hepatitis C blood testing is recommended for all people born from 1945 through 1965 and any individual with known risks for hepatitis C.  Practice safe sex. Use condoms and avoid high-risk sexual practices to reduce the spread of sexually transmitted infections (STIs). STIs include gonorrhea, chlamydia, syphilis, trichomonas, herpes, HPV, and human immunodeficiency virus (HIV). Herpes, HIV, and HPV are viral illnesses that have no cure. They can result in disability, cancer, and death.  You should be screened for sexually transmitted illnesses (STIs) including gonorrhea and chlamydia if:  You are sexually active and are younger than 24 years.  You are older than 24 years and your health care provider tells you that you are at risk for this type of infection.  Your sexual activity has changed since you were last screened and you are at an increased risk for chlamydia or gonorrhea. Ask your health care provider if you are at risk.  If you are at risk of being infected with HIV, it is recommended that you take a prescription medicine daily to prevent HIV infection. This is  called preexposure prophylaxis (PrEP). You are considered at risk if:  You are a heterosexual woman, are sexually active, and are at increased risk for HIV infection.  You take drugs by injection.  You are sexually active with a partner who has HIV.  Talk with your health care provider about whether you are at high risk of being infected with HIV. If you choose to begin PrEP, you should first be tested for HIV. You should then be tested every 3 months for as long as you are taking PrEP.  Osteoporosis is a disease in which the bones lose minerals and strength   with aging. This can result in serious bone fractures or breaks. The risk of osteoporosis can be identified using a bone density scan. Women ages 65 years and over and women at risk for fractures or osteoporosis should discuss screening with their health care providers. Ask your health care provider whether you should take a calcium supplement or vitamin D to reduce the rate of osteoporosis.  Menopause can be associated with physical symptoms and risks. Hormone replacement therapy is available to decrease symptoms and risks. You should talk to your health care provider about whether hormone replacement therapy is right for you.  Use sunscreen. Apply sunscreen liberally and repeatedly throughout the day. You should seek shade when your shadow is shorter than you. Protect yourself by wearing long sleeves, pants, a wide-brimmed hat, and sunglasses year round, whenever you are outdoors.  Once a month, do a whole body skin exam, using a mirror to look at the skin on your back. Tell your health care provider of new moles, moles that have irregular borders, moles that are larger than a pencil eraser, or moles that have changed in shape or color.  Stay current with required vaccines (immunizations).  Influenza vaccine. All adults should be immunized every year.  Tetanus, diphtheria, and acellular pertussis (Td, Tdap) vaccine. Pregnant women should  receive 1 dose of Tdap vaccine during each pregnancy. The dose should be obtained regardless of the length of time since the last dose. Immunization is preferred during the 27th-36th week of gestation. An adult who has not previously received Tdap or who does not know her vaccine status should receive 1 dose of Tdap. This initial dose should be followed by tetanus and diphtheria toxoids (Td) booster doses every 10 years. Adults with an unknown or incomplete history of completing a 3-dose immunization series with Td-containing vaccines should begin or complete a primary immunization series including a Tdap dose. Adults should receive a Td booster every 10 years.  Varicella vaccine. An adult without evidence of immunity to varicella should receive 2 doses or a second dose if she has previously received 1 dose. Pregnant females who do not have evidence of immunity should receive the first dose after pregnancy. This first dose should be obtained before leaving the health care facility. The second dose should be obtained 4-8 weeks after the first dose.  Human papillomavirus (HPV) vaccine. Females aged 13-26 years who have not received the vaccine previously should obtain the 3-dose series. The vaccine is not recommended for use in pregnant females. However, pregnancy testing is not needed before receiving a dose. If a female is found to be pregnant after receiving a dose, no treatment is needed. In that case, the remaining doses should be delayed until after the pregnancy. Immunization is recommended for any person with an immunocompromised condition through the age of 26 years if she did not get any or all doses earlier. During the 3-dose series, the second dose should be obtained 4-8 weeks after the first dose. The third dose should be obtained 24 weeks after the first dose and 16 weeks after the second dose.  Zoster vaccine. One dose is recommended for adults aged 60 years or older unless certain conditions are  present.  Measles, mumps, and rubella (MMR) vaccine. Adults born before 1957 generally are considered immune to measles and mumps. Adults born in 1957 or later should have 1 or more doses of MMR vaccine unless there is a contraindication to the vaccine or there is laboratory evidence of immunity to   each of the three diseases. A routine second dose of MMR vaccine should be obtained at least 28 days after the first dose for students attending postsecondary schools, health care workers, or international travelers. People who received inactivated measles vaccine or an unknown type of measles vaccine during 1963-1967 should receive 2 doses of MMR vaccine. People who received inactivated mumps vaccine or an unknown type of mumps vaccine before 1979 and are at high risk for mumps infection should consider immunization with 2 doses of MMR vaccine. For females of childbearing age, rubella immunity should be determined. If there is no evidence of immunity, females who are not pregnant should be vaccinated. If there is no evidence of immunity, females who are pregnant should delay immunization until after pregnancy. Unvaccinated health care workers born before 1957 who lack laboratory evidence of measles, mumps, or rubella immunity or laboratory confirmation of disease should consider measles and mumps immunization with 2 doses of MMR vaccine or rubella immunization with 1 dose of MMR vaccine.  Pneumococcal 13-valent conjugate (PCV13) vaccine. When indicated, a person who is uncertain of her immunization history and has no record of immunization should receive the PCV13 vaccine. An adult aged 19 years or older who has certain medical conditions and has not been previously immunized should receive 1 dose of PCV13 vaccine. This PCV13 should be followed with a dose of pneumococcal polysaccharide (PPSV23) vaccine. The PPSV23 vaccine dose should be obtained at least 8 weeks after the dose of PCV13 vaccine. An adult aged 19  years or older who has certain medical conditions and previously received 1 or more doses of PPSV23 vaccine should receive 1 dose of PCV13. The PCV13 vaccine dose should be obtained 1 or more years after the last PPSV23 vaccine dose.  Pneumococcal polysaccharide (PPSV23) vaccine. When PCV13 is also indicated, PCV13 should be obtained first. All adults aged 65 years and older should be immunized. An adult younger than age 65 years who has certain medical conditions should be immunized. Any person who resides in a nursing home or long-term care facility should be immunized. An adult smoker should be immunized. People with an immunocompromised condition and certain other conditions should receive both PCV13 and PPSV23 vaccines. People with human immunodeficiency virus (HIV) infection should be immunized as soon as possible after diagnosis. Immunization during chemotherapy or radiation therapy should be avoided. Routine use of PPSV23 vaccine is not recommended for American Indians, [place] Natives, or people younger than 65 years unless there are medical conditions that require PPSV23 vaccine. When indicated, people who have unknown immunization and have no record of immunization should receive PPSV23 vaccine. One-time revaccination 5 years after the first dose of PPSV23 is recommended for people aged 19-64 years who have chronic kidney failure, nephrotic syndrome, asplenia, or immunocompromised conditions. People who received 1-2 doses of PPSV23 before age 65 years should receive another dose of PPSV23 vaccine at age 65 years or later if at least 5 years have passed since the previous dose. Doses of PPSV23 are not needed for people immunized with PPSV23 at or after age 65 years.  Meningococcal vaccine. Adults with asplenia or persistent complement component deficiencies should receive 2 doses of quadrivalent meningococcal conjugate (MenACWY-D) vaccine. The doses should be obtained at least 2 months apart.  Microbiologists working with certain meningococcal bacteria, military recruits, people at risk during an outbreak, and people who travel to or live in countries with a high rate of meningitis should be immunized. A first-year college student up through age   21 years who is living in a residence hall should receive a dose if she did not receive a dose on or after her 16th birthday. Adults who have certain high-risk conditions should receive one or more doses of vaccine.  Hepatitis A vaccine. Adults who wish to be protected from this disease, have certain high-risk conditions, work with hepatitis A-infected animals, work in hepatitis A research labs, or travel to or work in countries with a high rate of hepatitis A should be immunized. Adults who were previously unvaccinated and who anticipate close contact with an international adoptee during the first 60 days after arrival in the [place] from a country with a high rate of hepatitis A should be immunized.  Hepatitis B vaccine. Adults who wish to be protected from this disease, have certain high-risk conditions, may be exposed to blood or other infectious body fluids, are household contacts or sex partners of hepatitis B positive people, are clients or workers in certain care facilities, or travel to or work in countries with a high rate of hepatitis B should be immunized.  Haemophilus influenzae type b (Hib) vaccine. A previously unvaccinated person with asplenia or sickle cell disease or having a scheduled splenectomy should receive 1 dose of Hib vaccine. Regardless of previous immunization, a recipient of a hematopoietic stem cell transplant should receive a 3-dose series 6-12 months after her successful transplant. Hib vaccine is not recommended for adults with HIV infection. Preventive Services / Frequency Ages 64 to 68 years  Blood pressure check.** / Every 1 to 2 years.  Lipid and cholesterol check.** / Every 5 years beginning at age  22.  Clinical breast exam.** / Every 3 years for women in their 88s and 53s.  BRCA-related cancer risk assessment.** / For women who have family members with a BRCA-related cancer (breast, ovarian, tubal, or peritoneal cancers).  Pap test.** / Every 2 years from ages 90 through 51. Every 3 years starting at age 21 through age 56 or 3 with a history of 3 consecutive normal Pap tests.  HPV screening.** / Every 3 years from ages 24 through ages 1 to 46 with a history of 3 consecutive normal Pap tests.  Hepatitis C blood test.** / For any individual with known risks for hepatitis C.  Skin self-exam. / Monthly.  Influenza vaccine. / Every year.  Tetanus, diphtheria, and acellular pertussis (Tdap, Td) vaccine.** / Consult your health care provider. Pregnant women should receive 1 dose of Tdap vaccine during each pregnancy. 1 dose of Td every 10 years.  Varicella vaccine.** / Consult your health care provider. Pregnant females who do not have evidence of immunity should receive the first dose after pregnancy.  HPV vaccine. / 3 doses over 6 months, if 72 and younger. The vaccine is not recommended for use in pregnant females. However, pregnancy testing is not needed before receiving a dose.  Measles, mumps, rubella (MMR) vaccine.** / You need at least 1 dose of MMR if you were born in 1957 or later. You may also need a 2nd dose. For females of childbearing age, rubella immunity should be determined. If there is no evidence of immunity, females who are not pregnant should be vaccinated. If there is no evidence of immunity, females who are pregnant should delay immunization until after pregnancy.  Pneumococcal 13-valent conjugate (PCV13) vaccine.** / Consult your health care provider.  Pneumococcal polysaccharide (PPSV23) vaccine.** / 1 to 2 doses if you smoke cigarettes or if you have certain conditions.  Meningococcal vaccine.** /  1 dose if you are age 19 to 21 years and a first-year college  student living in a residence hall, or have one of several medical conditions, you need to get vaccinated against meningococcal disease. You may also need additional booster doses.  Hepatitis A vaccine.** / Consult your health care provider.  Hepatitis B vaccine.** / Consult your health care provider.  Haemophilus influenzae type b (Hib) vaccine.** / Consult your health care provider. Ages 40 to 64 years  Blood pressure check.** / Every 1 to 2 years.  Lipid and cholesterol check.** / Every 5 years beginning at age 20 years.  Lung cancer screening. / Every year if you are aged 55-80 years and have a 30-pack-year history of smoking and currently smoke or have quit within the past 15 years. Yearly screening is stopped once you have quit smoking for at least 15 years or develop a health problem that would prevent you from having lung cancer treatment.  Clinical breast exam.** / Every year after age 40 years.  BRCA-related cancer risk assessment.** / For women who have family members with a BRCA-related cancer (breast, ovarian, tubal, or peritoneal cancers).  Mammogram.** / Every year beginning at age 40 years and continuing for as long as you are in good health. Consult with your health care provider.  Pap test.** / Every 3 years starting at age 30 years through age 65 or 70 years with a history of 3 consecutive normal Pap tests.  HPV screening.** / Every 3 years from ages 30 years through ages 65 to 70 years with a history of 3 consecutive normal Pap tests.  Fecal occult blood test (FOBT) of stool. / Every year beginning at age 50 years and continuing until age 75 years. You may not need to do this test if you get a colonoscopy every 10 years.  Flexible sigmoidoscopy or colonoscopy.** / Every 5 years for a flexible sigmoidoscopy or every 10 years for a colonoscopy beginning at age 50 years and continuing until age 75 years.  Hepatitis C blood test.** / For all people born from 1945 through  1965 and any individual with known risks for hepatitis C.  Skin self-exam. / Monthly.  Influenza vaccine. / Every year.  Tetanus, diphtheria, and acellular pertussis (Tdap/Td) vaccine.** / Consult your health care provider. Pregnant women should receive 1 dose of Tdap vaccine during each pregnancy. 1 dose of Td every 10 years.  Varicella vaccine.** / Consult your health care provider. Pregnant females who do not have evidence of immunity should receive the first dose after pregnancy.  Zoster vaccine.** / 1 dose for adults aged 60 years or older.  Measles, mumps, rubella (MMR) vaccine.** / You need at least 1 dose of MMR if you were born in 1957 or later. You may also need a 2nd dose. For females of childbearing age, rubella immunity should be determined. If there is no evidence of immunity, females who are not pregnant should be vaccinated. If there is no evidence of immunity, females who are pregnant should delay immunization until after pregnancy.  Pneumococcal 13-valent conjugate (PCV13) vaccine.** / Consult your health care provider.  Pneumococcal polysaccharide (PPSV23) vaccine.** / 1 to 2 doses if you smoke cigarettes or if you have certain conditions.  Meningococcal vaccine.** / Consult your health care provider.  Hepatitis A vaccine.** / Consult your health care provider.  Hepatitis B vaccine.** / Consult your health care provider.  Haemophilus influenzae type b (Hib) vaccine.** / Consult your health care provider. Ages 65   years and over  Blood pressure check.** / Every 1 to 2 years.  Lipid and cholesterol check.** / Every 5 years beginning at age 22 years.  Lung cancer screening. / Every year if you are aged 73-80 years and have a 30-pack-year history of smoking and currently smoke or have quit within the past 15 years. Yearly screening is stopped once you have quit smoking for at least 15 years or develop a health problem that would prevent you from having lung cancer  treatment.  Clinical breast exam.** / Every year after age 4 years.  BRCA-related cancer risk assessment.** / For women who have family members with a BRCA-related cancer (breast, ovarian, tubal, or peritoneal cancers).  Mammogram.** / Every year beginning at age 40 years and continuing for as long as you are in good health. Consult with your health care provider.  Pap test.** / Every 3 years starting at age 9 years through age 34 or 91 years with 3 consecutive normal Pap tests. Testing can be stopped between 65 and 70 years with 3 consecutive normal Pap tests and no abnormal Pap or HPV tests in the past 10 years.  HPV screening.** / Every 3 years from ages 57 years through ages 64 or 45 years with a history of 3 consecutive normal Pap tests. Testing can be stopped between 65 and 70 years with 3 consecutive normal Pap tests and no abnormal Pap or HPV tests in the past 10 years.  Fecal occult blood test (FOBT) of stool. / Every year beginning at age 15 years and continuing until age 17 years. You may not need to do this test if you get a colonoscopy every 10 years.  Flexible sigmoidoscopy or colonoscopy.** / Every 5 years for a flexible sigmoidoscopy or every 10 years for a colonoscopy beginning at age 86 years and continuing until age 71 years.  Hepatitis C blood test.** / For all people born from [birth year] through 1964 and any individual with known risks for hepatitis C.  Osteoporosis screening.** / A one-time screening for women ages 83 years and over and women at risk for fractures or osteoporosis.  Skin self-exam. / Monthly.  Influenza vaccine. / Every year.  Tetanus, diphtheria, and acellular pertussis (Tdap/Td) vaccine.** / 1 dose of Td every 10 years.  Varicella vaccine.** / Consult your health care provider.  Zoster vaccine.** / 1 dose for adults aged 61 years or older.  Pneumococcal 13-valent conjugate (PCV13) vaccine.** / Consult your health care provider.  Pneumococcal  polysaccharide (PPSV23) vaccine.** / 1 dose for all adults aged 28 years and older.  Meningococcal vaccine.** / Consult your health care provider.  Hepatitis A vaccine.** / Consult your health care provider.  Hepatitis B vaccine.** / Consult your health care provider.  Haemophilus influenzae type b (Hib) vaccine.** / Consult your health care provider. ** Family history and personal history of risk and conditions may change your health care provider's recommendations. Document Released: 04/03/2001 Document Revised: 06/22/2013 Document Reviewed: 07/03/2010 [facility] Patient Information 2015 [place], [place]. This information is not intended to replace advice given to you by your health care provider. Make sure you discuss any questions you have with your health care provider.

## 2014-05-20 NOTE — Progress Notes (Signed)
Subjective:   Sierra Ford is a 74 y.o. female who presents for Medicare Annual (Subsequent) preventive examination.  Review of Systems:   Review of Systems  Constitutional: Negative for activity change, appetite change and fatigue.  HENT: Negative for hearing loss, congestion, tinnitus and ear discharge.   Eyes: Negative for visual disturbance (see optho q1y -- vision corrected to 20/20 with glasses).  Respiratory: Negative for cough, chest tightness and shortness of breath.   Cardiovascular: Negative for chest pain, palpitations and leg swelling.  Gastrointestinal: Negative for abdominal pain, diarrhea, constipation and abdominal distention.  Genitourinary: Negative for urgency, frequency, decreased urine volume and difficulty urinating.  Musculoskeletal: Negative for back pain, arthralgias and gait problem.  Skin: Negative for color change, pallor and rash.  Neurological: Negative for dizziness, light-headedness, numbness and headaches.  Hematological: Negative for adenopathy. Does not bruise/bleed easily.  Psychiatric/Behavioral: Negative for suicidal ideas, confusion, sleep disturbance, self-injury, dysphoric mood, decreased concentration and agitation.  Pt is able to read and write and can do all ADLs No risk for falling No abuse/ violence in home           Objective:     Vitals: BP 114/76 mmHg  Pulse 77  Temp(Src) 98 F (36.7 C) (Oral)  Ht 5' 4.5" (1.638 m)  Wt 183 lb 3.2 oz (83.099 kg)  BMI 30.97 kg/m2  SpO2 97% BP 114/76 mmHg  Pulse 77  Temp(Src) 98 F (36.7 C) (Oral)  Ht 5' 4.5" (1.638 m)  Wt 183 lb 3.2 oz (83.099 kg)  BMI 30.97 kg/m2  SpO2 97% General appearance: alert, cooperative, appears stated age and no distress Head: Normocephalic, without obvious abnormality, atraumatic Eyes: conjunctivae/corneas clear. PERRL, EOM's intact. Fundi benign. Ears: normal TM's and external ear canals both ears Nose: Nares normal. Septum midline. Mucosa normal.  No drainage or sinus tenderness. Throat: lips, mucosa, and tongue normal; teeth and gums normal Neck: no adenopathy, no carotid bruit, no JVD, supple, symmetrical, trachea midline and thyroid not enlarged, symmetric, no tenderness/mass/nodules Back: symmetric, no curvature. ROM normal. No CVA tenderness. Lungs: clear to auscultation bilaterally Breasts: gyn Heart: regular rate and rhythm, S1, S2 normal, no murmur, click, rub or gallop Abdomen: soft, non-tender; bowel sounds normal; no masses,  no organomegaly Pelvic: deferred Extremities: extremities normal, atraumatic, no cyanosis or edema Pulses: 2+ and symmetric Skin: Skin color, texture, turgor normal. No rashes or lesions Lymph nodes: Cervical, supraclavicular, and axillary nodes normal. Neurologic: Alert and oriented X 3, normal strength and tone. Normal symmetric reflexes. Normal coordination and gait Psych--  No depression, no anxiety Tobacco History  Smoking status  . Former Smoker -- 1.00 packs/day for 50 years  . Types: Cigarettes  . Quit date: 11/21/2009  Smokeless tobacco  . Never Used     Counseling given: Not Answered   Past Medical History  Diagnosis Date  . DVT (deep venous thrombosis) 2006    hx of  . Hyperlipidemia   . PE (pulmonary embolism) 2006    hx of  . UTI (lower urinary tract infection)     hx  . Ectopic pregnancy with intrauterine pregnancy   . Chest pain     CLite with apical ischemia in 2006 - normal coronary arteries by [facility] in 12/2004;  Myoview 11/12:  Low risk stress nuclear study with a small, partially reversible apical defect most likely related to apical thinning; cannot R/O very mild apical ischemia.  EF: 75%   . Hiatal hernia    Past Surgical  History  Procedure Laterality Date  . Foot surgery    . Abdominal exploration surgery    . Ectopic pregnancy surgery    . Bunionectomy      right   Family History  Problem Relation Age of Onset  . Heart failure Mother     died from  .  Colon polyps Mother   . Diabetes Maternal Aunt     grandaughter   History  Sexual Activity  . Sexual Activity:  . Partners: Male    Outpatient Encounter Prescriptions as of 05/20/2014  Medication Sig  . aspirin EC 81 MG tablet Take 81 mg by mouth at bedtime.    Marland Kitchen CALCIUM-VITAMIN D PO Take 600 Units by mouth 2 (two) times daily.    . cholecalciferol (VITAMIN D-400) 400 UNITS TABS Take 400 Units by mouth 2 (two) times daily.   Marland Kitchen ezetimibe (ZETIA) 10 MG tablet Take 1/2 to 1 tablet daily or as directed.  . furosemide (LASIX) 20 MG tablet TAKE ONE TABLET BY MOUTH ONCE DAILY  . Multiple Vitamin (MULTIVITAMIN PO) Take 1 tablet by mouth at bedtime.   . rosuvastatin (CRESTOR) 20 MG tablet Take 1/2 to 1 tablet daily or as directed.  . [DISCONTINUED] furosemide (LASIX) 20 MG tablet TAKE ONE TABLET BY MOUTH ONCE DAILY    Activities of Daily Living In your present state of health, do you have any difficulty performing the following activities: 05/20/2014  Is the patient deaf or have difficulty hearing? N  Hearing N  Vision Y  Difficulty concentrating or making decisions N  Walking or climbing stairs? N  Doing errands, shopping? N    Patient Care Team: Rosalita Chessman, DO as PCP - General Maisie Fus, MD as Consulting Physician (Obstetrics and Gynecology)    Assessment:    1. cpe Exercise Activities and Dietary recommendations-- con't with current exercise plan    Goals    . LDL CALC < 100      Fall Risk Fall Risk  05/20/2014 04/24/2013  Falls in the past year? No No   Depression Screen PHQ 2/9 Scores 05/20/2014 04/24/2013  PHQ - 2 Score 1 0     Cognitive Testing No flowsheet data found.  Immunization History  Administered Date(s) Administered  . Pneumococcal Conjugate-13 04/24/2013  . Pneumococcal Polysaccharide-23 08/07/2007  . Td 03/03/2003  . Zoster 08/14/2007, 11/13/2007   Screening Tests Health Maintenance  Topic Date Due  . TETANUS/TDAP  03/02/2013  . INFLUENZA  VACCINE  09/20/2014 (Originally 09/19/2013)  . MAMMOGRAM  02/20/2015  . COLONOSCOPY  10/22/2017  . DEXA SCAN  Completed  . ZOSTAVAX  Completed  . PNA vac Low Risk Adult  Completed      Plan:    see AVS Check labs ghm utd During the course of the visit the patient was educated and counseled about the following appropriate screening and preventive services:   Vaccines to include Pneumoccal, Influenza, Hepatitis B, Td, Zostavax, HCV  Electrocardiogram  Cardiovascular Disease  Colorectal cancer screening  Bone density screening  Diabetes screening  Glaucoma screening  Mammography/PAP  Nutrition counseling   Patient Instructions (the written plan) was given to the patient.  1. Essential hypertension Stable  - furosemide (LASIX) 20 MG tablet; TAKE ONE TABLET BY MOUTH ONCE DAILY  Dispense: 90 tablet; Refill: 3 - Basic metabolic panel - CBC with Differential/Platelet - Hepatic function panel - Lipid panel - Microalbumin / creatinine urine ratio - POCT urinalysis dipstick  2. Hyperlipidemia Per lipid clinic  On crestor - Basic metabolic panel - CBC with Differential/Platelet - Hepatic function panel - Lipid panel - Microalbumin / creatinine urine ratio - POCT urinalysis dipstick  3. Family history of diabetes mellitus Check labs   4. Medicare annual wellness visit, subsequent   Garnet Koyanagi, DO  05/20/2014

## 2014-05-20 NOTE — Progress Notes (Signed)
Pre visit review using our clinic review tool, if applicable. No additional management support is needed unless otherwise documented below in the visit note. 

## 2014-05-26 ENCOUNTER — Other Ambulatory Visit (INDEPENDENT_AMBULATORY_CARE_PROVIDER_SITE_OTHER): Payer: Medicare Other

## 2014-05-26 DIAGNOSIS — E785 Hyperlipidemia, unspecified: Secondary | ICD-10-CM | POA: Diagnosis not present

## 2014-05-26 LAB — LIPID PANEL
Cholesterol: 184 mg/dL (ref 0–200)
HDL: 82.7 mg/dL (ref >39.00)
LDL CALC: 86 mg/dL (ref 0–99)
NONHDL: 101.3
TRIGLYCERIDES: 75 mg/dL (ref 0.0–149.0)
Total CHOL/HDL Ratio: 2
VLDL: 15 mg/dL (ref 0.0–40.0)

## 2014-05-27 LAB — PTH, INTACT AND CALCIUM
Calcium: 9.5 mg/dL (ref 8.4–10.5)
PTH: 32 pg/mL (ref 14–64)

## 2014-06-08 ENCOUNTER — Telehealth: Payer: Self-pay | Admitting: Family Medicine

## 2014-06-08 NOTE — Telephone Encounter (Signed)
Spoke with patient and she verbalized that her PTH and calcium were good. She said she will continue to watch her diet and exercise to help improve her cholesterol. No further questions or concerns.    KP

## 2014-06-08 NOTE — Telephone Encounter (Signed)
Caller name:Sierra Ford Relationship to patient:self Can be [phone]   Reason for call: Pt wanting to go over resultsletter sent on 05/31/14-

## 2014-07-08 ENCOUNTER — Ambulatory Visit (INDEPENDENT_AMBULATORY_CARE_PROVIDER_SITE_OTHER): Payer: Medicare Other | Admitting: Cardiology

## 2014-07-08 ENCOUNTER — Encounter: Payer: Self-pay | Admitting: Cardiology

## 2014-07-08 VITALS — BP 110/66 | HR 60 | Ht 63.0 in | Wt 171.0 lb

## 2014-07-08 DIAGNOSIS — R0609 Other forms of dyspnea: Secondary | ICD-10-CM

## 2014-07-08 DIAGNOSIS — R0602 Shortness of breath: Secondary | ICD-10-CM | POA: Diagnosis not present

## 2014-07-08 DIAGNOSIS — I5032 Chronic diastolic (congestive) heart failure: Secondary | ICD-10-CM

## 2014-07-08 NOTE — Patient Instructions (Signed)
Your physician recommends that you schedule a follow-up appointment in: one year with Dr. Hochrein  

## 2014-07-08 NOTE — Progress Notes (Signed)
HPI The patient presents for evaluation of dyspnea.  She has had some mild diastolic dysfunction and abnormal echo. However, most recently she's had a normal BNP. She's actually not complaining of dyspnea now. She's able to do some walking. She actually lost 15 pounds. She walks on a treadmill. She exercises routinely. The patient denies any new symptoms such as chest discomfort, neck or arm discomfort. There has been no new shortness of breath, PND or orthopnea. There have been no reported palpitations, presyncope or syncope.   No Known Allergies  Current Outpatient Prescriptions  Medication Sig Dispense Refill  . aspirin EC 81 MG tablet Take 81 mg by mouth at bedtime.      Marland Kitchen CALCIUM-VITAMIN D PO Take 600 Units by mouth 2 (two) times daily.      . cholecalciferol (VITAMIN D-400) 400 UNITS TABS Take 400 Units by mouth 2 (two) times daily.     . furosemide (LASIX) 20 MG tablet TAKE ONE TABLET BY MOUTH ONCE DAILY 90 tablet 3  . Multiple Vitamin (MULTIVITAMIN PO) Take 1 tablet by mouth at bedtime.     . rosuvastatin (CRESTOR) 20 MG tablet Take 1/2 to 1 tablet daily or as directed. 90 tablet 3  . [DISCONTINUED] omega-3 acid ethyl esters (LOVAZA) 1 G capsule Take 1 g by mouth 3 (three) times daily.      . [DISCONTINUED] pravastatin (PRAVACHOL) 20 MG tablet Take 20 mg by mouth daily.       No current facility-administered medications for this visit.    Past Medical History  Diagnosis Date  . DVT (deep venous thrombosis) 2006    hx of  . Hyperlipidemia   . PE (pulmonary embolism) 2006    hx of  . UTI (lower urinary tract infection)     hx  . Ectopic pregnancy with intrauterine pregnancy   . Chest pain     CLite with apical ischemia in 2006 - normal coronary arteries by [facility] in 12/2004;  Myoview 11/12:  Low risk stress nuclear study with a small, partially reversible apical defect most likely related to apical thinning; cannot R/O very mild apical ischemia.  EF: 75%   . Hiatal hernia      Past Surgical History  Procedure Laterality Date  . Foot surgery    . Abdominal exploration surgery    . Ectopic pregnancy surgery    . Bunionectomy      right    ROS:  As stated in the HPI and negative for all other systems.  PHYSICAL EXAM BP 110/66 mmHg  Pulse 60  Ht 5\' 3"  (1.6 m)  Wt 171 lb (77.565 kg)  BMI 30.30 kg/m2 GENERAL:  Well appearing NECK:  No jugular venous distention, waveform within normal limits, carotid upstroke brisk and symmetric, no bruits, no thyromegaly LUNGS:  Clear to auscultation bilaterally CHEST:  Unremarkable HEART:  PMI not displaced or sustained,S1 and S2 within normal limits, no S3, no S4, no clicks, no rubs, no murmurs ABD:  Flat, positive bowel sounds normal in frequency in pitch, no bruits, no rebound, no guarding, no midline pulsatile mass, no hepatomegaly, no splenomegaly EXT:  2 plus pulses throughout, trace edema, no cyanosis no clubbing  EKG:  Sinus rhythm, rate 60, left axis deviation, poor anterior R wave progression, minimal voltage criteria for left ventricular hypertrophy, no acute ST-T wave changes.  07/08/2014   ASSESSMENT AND PLAN  DIASTOLIC HF:  The patient had mild evidence of this on echo.  IBNP was normal.  She is breathing well and participating in risk reduction lifestyle modification. No change in therapy or further imaging is indicated.  EDEMA:  This has been mild.  No further work up is indicated.

## 2014-07-13 NOTE — Telephone Encounter (Signed)
error 

## 2014-10-26 ENCOUNTER — Other Ambulatory Visit (INDEPENDENT_AMBULATORY_CARE_PROVIDER_SITE_OTHER): Payer: Medicare Other | Admitting: *Deleted

## 2014-10-26 DIAGNOSIS — E785 Hyperlipidemia, unspecified: Secondary | ICD-10-CM | POA: Diagnosis not present

## 2014-10-26 LAB — LIPID PANEL
CHOLESTEROL: 262 mg/dL — AB (ref 0–200)
HDL: 105.4 mg/dL (ref >39.00)
LDL Cholesterol: 138 mg/dL — ABNORMAL HIGH (ref 0–99)
NONHDL: 156.75
Total CHOL/HDL Ratio: 2
Triglycerides: 93 mg/dL (ref 0.0–149.0)
VLDL: 18.6 mg/dL (ref 0.0–40.0)

## 2014-10-26 LAB — HEPATIC FUNCTION PANEL
ALT: 21 U/L (ref 0–35)
AST: 21 U/L (ref 0–37)
Albumin: 4.5 g/dL (ref 3.5–5.2)
Alkaline Phosphatase: 83 U/L (ref 39–117)
BILIRUBIN DIRECT: 0.2 mg/dL (ref 0.0–0.3)
Total Bilirubin: 1.2 mg/dL (ref 0.2–1.2)
Total Protein: 7.5 g/dL (ref 6.0–8.3)

## 2014-10-27 ENCOUNTER — Ambulatory Visit (INDEPENDENT_AMBULATORY_CARE_PROVIDER_SITE_OTHER): Payer: Medicare Other | Admitting: Pharmacist

## 2014-10-27 VITALS — Wt 174.0 lb

## 2014-10-27 DIAGNOSIS — E785 Hyperlipidemia, unspecified: Secondary | ICD-10-CM | POA: Diagnosis not present

## 2014-10-27 NOTE — Progress Notes (Signed)
Patient ID: BOBBETTE EAKES                DOB: 2040/05/15, 74 yo                     MRN: [id number]     HPI: Sierra Ford is a 73 y.o. female patient well known to lipid clinic who presents for 6 month follow up visit. PMH is significant for diastolic heart failure, mild CAD, and HLD. She has a history of statin failure with atorvastatin, pravastatin, and Crestor 20mg  daily. She experienced myalgias with these statins as well as with Lovaza. She is currently taking Crestor 10mg  daily and tolerating it well. During her last visit, she was prescribed Zetia, however patient reports that her copay was $300-400 per month and she did not start taking it.   Current Medications: Crestor 10mg  - max tolerated dose Intolerances: Lipitor, pravastatin, Lovaza, Crestor 20mg , Lovaza - myalgias Risk Factors: mild-moderate carotid plaque on ultrasound LDL goal: 100mg /dL  Diet: Admits summer is harder for her diet-wise because she has been traveling, most recently to [facility]. Hasn't been eating breakfast recently and has her first meal between 12-2pm. Yesterday, she had [place] hamburgers for lunch. For dinner, she had salmon, corn, and spinach. She says that when she indulges in 1 meal, she does better at the one after. She has not been eating many salads and has been eating [employer] milkshakes at least once a week. She reports that ice cream is her weakness. She is also going on a cruise in October and would like to lose ~10 pounds in anticipation of it.Her ideal weight is 155 pounds, although she says she would be happy with 165. Her weight today in clinic was 174 pounds.  Exercise: Has not been exercising because she has been "lazy" this summer. Usually likes to walk on the treadmill although this had been giving her heel pain. She records workout shows at home and would like to start exercising to these again.  Family History: Mother had heart failure  Social History: Smoked cigarettes for 50  years - quit 3 years ago. Minimal alcohol use, no drug use.  Labs: 10/2014: TC 262, TG 93, HDL 105.4, LDL 138, LFTs wnl (Crestor 10mg  daily) - admits to many dietary indiscretions this summer 05/2014: TC 184, TG 75, HDL 82.7, LDL 86, LFTs wnl (Crestor 10mg  daily)  Past Medical History  Diagnosis Date  . DVT (deep venous thrombosis) 2006    hx of  . Hyperlipidemia   . PE (pulmonary embolism) 2006    hx of  . UTI (lower urinary tract infection)     hx  . Ectopic pregnancy with intrauterine pregnancy   . Chest pain     CLite with apical ischemia in 2006 - normal coronary arteries by [facility] in 12/2004;  Myoview 11/12:  Low risk stress nuclear study with a small, partially reversible apical defect most likely related to apical thinning; cannot R/O very mild apical ischemia.  EF: 75%   . Hiatal hernia     Current Outpatient Prescriptions on File Prior to Visit  Medication Sig Dispense Refill  . aspirin EC 81 MG tablet Take 81 mg by mouth at bedtime.      Marland Kitchen CALCIUM-VITAMIN D PO Take 600 Units by mouth 2 (two) times daily.      . cholecalciferol (VITAMIN D-400) 400 UNITS TABS Take 400 Units by mouth 2 (two) times daily.     Marland Kitchen  furosemide (LASIX) 20 MG tablet TAKE ONE TABLET BY MOUTH ONCE DAILY 90 tablet 3  . Multiple Vitamin (MULTIVITAMIN PO) Take 1 tablet by mouth at bedtime.     . rosuvastatin (CRESTOR) 20 MG tablet Take 1/2 to 1 tablet daily or as directed. 90 tablet 3  . [DISCONTINUED] omega-3 acid ethyl esters (LOVAZA) 1 G capsule Take 1 g by mouth 3 (three) times daily.      . [DISCONTINUED] pravastatin (PRAVACHOL) 20 MG tablet Take 20 mg by mouth daily.       No current facility-administered medications on file prior to visit.    No Known Allergies  Assessment/Plan:  1. Hyperlipidemia - patient with worsened LDL in the past month due to dietary indiscretions and lack of exercise. LDL has increased from 86 to 138 despite adherence to Crestor 10mg  daily. Zetia was prescribed at her last  visit but it was too expensive for her. She has tried Crestor 20mg  as well as atorvastatin and pravastatin and had myalgias with these. Given that patient is on her max tolerated dose of statin and has mild-moderate carotid plaque, will focus on lifestyle changes to bring LDL back < 100mg /dL. Set specific goals for patient regarding diet, exercise, and working on moderation for her upcoming cruise next month - see patient instructions for patient-set goals. Will f/u with patient in lipid clinic in 6 months.   Sierra Ford, PharmD [facility] [address] [place], [place] [address] Phone: [phone]; Fax: [phone] 10/27/2014 11:30 AM

## 2014-10-27 NOTE — Patient Instructions (Addendum)
Continue taking your Crestor 10mg  daily. Follow up lipid panel in 6 months (Tuesday March 8th - lab opens at 7:30 am, come in any time after for a fasting panel). We will see you in clinic in 6 months on Thursday, March 9th at 11am.  Diet goals: - Eat more eggs and protein shakes for breakfast, salads for lunch, and salmon/chicken for dinner - Eat more meals at home - Try to find low-fat ice cream or sorbet  Exercise goals: - Use your exercise tapes at least 5 days a week  Cruise goals: - Use the gym on the boat in the morning  - Try to avoid the ice cream machine - Limit yourself to 1 dessert per day

## 2014-11-04 ENCOUNTER — Telehealth: Payer: Self-pay | Admitting: Family Medicine

## 2014-11-04 NOTE — Telephone Encounter (Signed)
See results.     KP 

## 2014-11-04 NOTE — Telephone Encounter (Signed)
Pt returning call for results. Best # [phone]. Per [name] advised pt she needs to discuss results with her before scheduling.

## 2015-01-03 DIAGNOSIS — Z6832 Body mass index (BMI) 32.0-32.9, adult: Secondary | ICD-10-CM | POA: Diagnosis not present

## 2015-01-03 DIAGNOSIS — Z1321 Encounter for screening for nutritional disorder: Secondary | ICD-10-CM | POA: Diagnosis not present

## 2015-01-03 DIAGNOSIS — Z13228 Encounter for screening for other metabolic disorders: Secondary | ICD-10-CM | POA: Diagnosis not present

## 2015-01-03 DIAGNOSIS — Z1231 Encounter for screening mammogram for malignant neoplasm of breast: Secondary | ICD-10-CM | POA: Diagnosis not present

## 2015-01-03 DIAGNOSIS — Z130 Encounter for screening for diseases of the blood and blood-forming organs and certain disorders involving the immune mechanism: Secondary | ICD-10-CM | POA: Diagnosis not present

## 2015-01-03 DIAGNOSIS — Z13820 Encounter for screening for osteoporosis: Secondary | ICD-10-CM | POA: Diagnosis not present

## 2015-01-03 DIAGNOSIS — Z1329 Encounter for screening for other suspected endocrine disorder: Secondary | ICD-10-CM | POA: Diagnosis not present

## 2015-01-03 DIAGNOSIS — Z124 Encounter for screening for malignant neoplasm of cervix: Secondary | ICD-10-CM | POA: Diagnosis not present

## 2015-01-03 DIAGNOSIS — Z13220 Encounter for screening for lipoid disorders: Secondary | ICD-10-CM | POA: Diagnosis not present

## 2015-01-03 DIAGNOSIS — Z01419 Encounter for gynecological examination (general) (routine) without abnormal findings: Secondary | ICD-10-CM | POA: Diagnosis not present

## 2015-01-03 DIAGNOSIS — N958 Other specified menopausal and perimenopausal disorders: Secondary | ICD-10-CM | POA: Diagnosis not present

## 2015-01-03 LAB — CBC AND DIFFERENTIAL
HCT: 43 % (ref 36–46)
Hemoglobin: 14.1 g/dL (ref 12.0–16.0)
Platelets: 247 10*3/uL (ref 150–399)
WBC: 5.4 10^3/mL

## 2015-01-03 LAB — HEPATIC FUNCTION PANEL
ALK PHOS: 81 U/L (ref 25–125)
ALT: 22 U/L (ref 7–35)
AST: 24 U/L (ref 13–35)
BILIRUBIN, TOTAL: 1 mg/dL

## 2015-01-03 LAB — LIPID PANEL
Cholesterol: 236 mg/dL — AB (ref 0–200)
HDL: 123 mg/dL — AB (ref 35–70)
LDL Cholesterol: 97 mg/dL
Triglycerides: 79 mg/dL (ref 40–160)

## 2015-01-03 LAB — BASIC METABOLIC PANEL
BUN: 21 mg/dL (ref 4–21)
CREATININE: 0.8 mg/dL (ref 0.5–1.1)
GLUCOSE: 94 mg/dL
POTASSIUM: 4.8 mmol/L (ref 3.4–5.3)
Sodium: 138 mmol/L (ref 137–147)

## 2015-01-03 LAB — TSH: TSH: 1 u[IU]/mL (ref 0.41–5.90)

## 2015-02-01 ENCOUNTER — Encounter: Payer: Self-pay | Admitting: Internal Medicine

## 2015-03-15 ENCOUNTER — Telehealth: Payer: Self-pay | Admitting: Family Medicine

## 2015-03-15 NOTE — Telephone Encounter (Signed)
LM for pt to schedule f/u appt (was due to return 10/2014 and schedule for CPE due 04/2015) & update records for flu shot

## 2015-03-24 NOTE — Telephone Encounter (Signed)
Patient declined flu shot  °

## 2015-03-24 NOTE — Telephone Encounter (Signed)
LM for pt to call and schedule flu shot or update records. °

## 2015-03-24 NOTE — Telephone Encounter (Signed)
Noted  KP 

## 2015-04-14 ENCOUNTER — Ambulatory Visit: Payer: Medicare Other | Admitting: Family Medicine

## 2015-04-27 ENCOUNTER — Other Ambulatory Visit (INDEPENDENT_AMBULATORY_CARE_PROVIDER_SITE_OTHER): Payer: Medicare Other | Admitting: *Deleted

## 2015-04-27 DIAGNOSIS — E785 Hyperlipidemia, unspecified: Secondary | ICD-10-CM

## 2015-04-27 LAB — HEPATIC FUNCTION PANEL
ALK PHOS: 84 U/L (ref 33–130)
ALT: 21 U/L (ref 6–29)
AST: 20 U/L (ref 10–35)
Albumin: 4 g/dL (ref 3.6–5.1)
BILIRUBIN INDIRECT: 0.6 mg/dL (ref 0.2–1.2)
Bilirubin, Direct: 0.1 mg/dL (ref <=0.2)
TOTAL PROTEIN: 6.6 g/dL (ref 6.1–8.1)
Total Bilirubin: 0.7 mg/dL (ref 0.2–1.2)

## 2015-04-27 LAB — LIPID PANEL
Cholesterol: 232 mg/dL — ABNORMAL HIGH (ref 125–200)
HDL: 127 mg/dL (ref >=46)
LDL CALC: 91 mg/dL (ref <130)
Total CHOL/HDL Ratio: 1.8 Ratio (ref <=5.0)
Triglycerides: 70 mg/dL (ref <150)
VLDL: 14 mg/dL (ref <30)

## 2015-04-27 NOTE — Addendum Note (Signed)
Addended by: Eulis Foster on: 04/27/2015 10:33 AM   Modules accepted: Orders

## 2015-04-27 NOTE — Addendum Note (Signed)
Addended by: Eulis Foster on: 04/27/2015 10:32 AM   Modules accepted: Orders

## 2015-04-28 ENCOUNTER — Ambulatory Visit (INDEPENDENT_AMBULATORY_CARE_PROVIDER_SITE_OTHER): Payer: Medicare Other | Admitting: Pharmacist

## 2015-04-28 DIAGNOSIS — E785 Hyperlipidemia, unspecified: Secondary | ICD-10-CM

## 2015-04-28 MED ORDER — EZETIMIBE 10 MG PO TABS: 10.0000 mg | ORAL_TABLET | Freq: Every day | ORAL | Status: DC

## 2015-04-28 NOTE — Progress Notes (Signed)
Patient ID: Sierra Ford                 DOB: 1940-04-07, 75 yo                         MRN: [id number]     HPI: Sierra Ford is a 75 y.o. female patient who presents to lipid clinic for 6 month follow up. She is a longstanding patient of ours, with a history of having been on statins (atorvastatin, pravastatin, rosuvastatin 20mg ) and Lovaza, but did not tolerate these due to myalgias. Currently taking Crestor 10mg  daily and tolerating it well. She was previously prescribed Zetia but it was $300-400 a month.  She had a great time on her cruise but admits that she has not been eating as healthy as she would like. She has not been using her treadmill at home because she has plantar fasciitis. Her mother-in-law also died last week and she reports lots of stressful eating over the past few weeks.  Current Medications: rosuvastatin 10mg  daily - max tolerated dose Intolerances: rosuvastatin 20mg  daily - muscle aches, atorvastatin and pravastatin - muscle aches, Lovaza - muscle aches Risk Factors: mild-moderate carotid plaque on ultrasound LDL goal: 100mg /dL - conservative goal. Will aim for <70 due to carotid plaque.  Diet: Has been trying to eat more chicken - does fry it occasionally. Eats a lot of spinach, kale and collard greens. Tries to limit her red meat. Does eat [employer] and [employer] frequently.  Exercise: Plantar fasciitis has been limiting her mobility. She would like to start walking on her treadmill and using exercise videos.  Family History: Mother had heart failure.  Social History: Smoked cigarettes for 50 years - quit 3 years ago. Minimal alcohol use, no drug use.  Labs: 04/2015: TC 232, TG 70, HDL 127, LDL 91, LFTs wnl (Crestor 10mg  daily) 10/2014: TC 262, TG 93, HDL 105.4, LDL 138, LFTs wnl (Crestor 10mg  daily) - admits to many dietary indiscretions this summer 05/2014: TC 184, TG 75, HDL 82.7, LDL 86, LFTs wnl (Crestor 10mg  daily)  Past Medical History  Diagnosis Date  . DVT  (deep venous thrombosis) 2006    hx of  . Hyperlipidemia   . PE (pulmonary embolism) 2006    hx of  . UTI (lower urinary tract infection)     hx  . Ectopic pregnancy with intrauterine pregnancy   . Chest pain     CLite with apical ischemia in 2006 - normal coronary arteries by [facility] in 12/2004;  Myoview 11/12:  Low risk stress nuclear study with a small, partially reversible apical defect most likely related to apical thinning; cannot R/O very mild apical ischemia.  EF: 75%   . Hiatal hernia     Current Outpatient Prescriptions on File Prior to Visit  Medication Sig Dispense Refill  . aspirin EC 81 MG tablet Take 81 mg by mouth at bedtime.      Marland Kitchen CALCIUM-VITAMIN D PO Take 600 Units by mouth 2 (two) times daily.      . cholecalciferol (VITAMIN D-400) 400 UNITS TABS Take 400 Units by mouth 2 (two) times daily.     . furosemide (LASIX) 20 MG tablet TAKE ONE TABLET BY MOUTH ONCE DAILY 90 tablet 3  . Multiple Vitamin (MULTIVITAMIN PO) Take 1 tablet by mouth at bedtime.     . rosuvastatin (CRESTOR) 20 MG tablet Take 1/2 to 1 tablet daily or as directed. 90 tablet 3  . [  DISCONTINUED] omega-3 acid ethyl esters (LOVAZA) 1 G capsule Take 1 g by mouth 3 (three) times daily.      . [DISCONTINUED] pravastatin (PRAVACHOL) 20 MG tablet Take 20 mg by mouth daily.       No current facility-administered medications on file prior to visit.    No Known Allergies  Assessment/Plan:   1. Hyperlipidemia - LDL much improved from 138 to 91. She continues on Crestor 10mg  daily. Will add Zetia 10mg  daily since it is now generic - confirmed that monthly copay is $10 for patient. Will aim for an aggressive LDL goal <70 due to carotid plaque. Patient will work on dietary indiscretions and switch from [facility] and [employer] ice cream to low fat [place]. She will also try to start walking more and using her exercise videos. F/u lipid panel and clinic visit in 6 months.   Kolina Kube E. Burke Terry, PharmD, [place] [id number] [address], [place], [place] [address] Phone: [phone]; Fax: [phone] 04/28/2015 12:02 PM

## 2015-04-28 NOTE — Patient Instructions (Addendum)
Pick up your prescription for Zetia to start taking once daily. Switch your ice cream from [place] [employer] to low fat [place]. Start walking on your treadmill and use exercise tapes.  Recheck cholesterol on Wednesday, September 20. Lab opens at 7:30am, come in any time after for fasting lipid panel. We will see you in lipid clinic the next day, Thursday, September 21 at [facility]

## 2015-05-02 ENCOUNTER — Other Ambulatory Visit: Payer: Self-pay | Admitting: Cardiology

## 2015-05-02 NOTE — Telephone Encounter (Signed)
REFILL 

## 2015-05-03 ENCOUNTER — Telehealth: Payer: Self-pay | Admitting: Cardiology

## 2015-05-03 MED ORDER — ROSUVASTATIN CALCIUM 20 MG PO TABS: 10.0000 mg | ORAL_TABLET | Freq: Every day | ORAL | Status: DC

## 2015-05-03 NOTE — Telephone Encounter (Signed)
New message        *STAT* If patient is at the pharmacy, call can be transferred to refill team.   1. Which medications need to be refilled? (please list name of each medication and dose if known) generic crestor 20mg  2. Which pharmacy/location (including street and city if local pharmacy) is medication to be sent to? [facility] at [address]. Do they need a 30 day or 90 day supply? 90 tablets presc expired because she cuts them in half

## 2015-05-03 NOTE — Telephone Encounter (Signed)
Rx(s) sent to pharmacy electronically.  

## 2015-05-12 ENCOUNTER — Other Ambulatory Visit: Payer: Self-pay | Admitting: Cardiology

## 2015-05-12 MED ORDER — EZETIMIBE 10 MG PO TABS: 10.0000 mg | ORAL_TABLET | Freq: Every day | ORAL | Status: DC

## 2015-06-21 ENCOUNTER — Other Ambulatory Visit: Payer: Self-pay | Admitting: Family Medicine

## 2015-07-14 ENCOUNTER — Telehealth: Payer: Self-pay

## 2015-07-14 NOTE — Telephone Encounter (Signed)
Left message for patient to return call.

## 2015-07-15 ENCOUNTER — Encounter: Payer: Self-pay | Admitting: Family Medicine

## 2015-07-15 ENCOUNTER — Ambulatory Visit (INDEPENDENT_AMBULATORY_CARE_PROVIDER_SITE_OTHER): Payer: Medicare Other | Admitting: Family Medicine

## 2015-07-15 VITALS — BP 112/64 | HR 64 | Ht 63.0 in | Wt 179.6 lb

## 2015-07-15 DIAGNOSIS — E785 Hyperlipidemia, unspecified: Secondary | ICD-10-CM

## 2015-07-15 DIAGNOSIS — Z0000 Encounter for general adult medical examination without abnormal findings: Secondary | ICD-10-CM | POA: Diagnosis not present

## 2015-07-15 DIAGNOSIS — R600 Localized edema: Secondary | ICD-10-CM

## 2015-07-15 DIAGNOSIS — I10 Essential (primary) hypertension: Secondary | ICD-10-CM

## 2015-07-15 LAB — COMPREHENSIVE METABOLIC PANEL
ALBUMIN: 4.4 g/dL (ref 3.5–5.2)
ALK PHOS: 71 U/L (ref 39–117)
ALT: 21 U/L (ref 0–35)
AST: 22 U/L (ref 0–37)
BILIRUBIN TOTAL: 1.1 mg/dL (ref 0.2–1.2)
BUN: 16 mg/dL (ref 6–23)
CALCIUM: 10.7 mg/dL — AB (ref 8.4–10.5)
CHLORIDE: 105 meq/L (ref 96–112)
CO2: 31 mEq/L (ref 19–32)
CREATININE: 0.79 mg/dL (ref 0.40–1.20)
GFR: 91.54 mL/min (ref >60.00)
Glucose, Bld: 97 mg/dL (ref 70–99)
Potassium: 4.7 mEq/L (ref 3.5–5.1)
Sodium: 141 mEq/L (ref 135–145)
TOTAL PROTEIN: 7.1 g/dL (ref 6.0–8.3)

## 2015-07-15 LAB — LIPID PANEL
CHOL/HDL RATIO: 3
Cholesterol: 204 mg/dL — ABNORMAL HIGH (ref 0–200)
HDL: 81.2 mg/dL (ref >39.00)
LDL CALC: 108 mg/dL — AB (ref 0–99)
NONHDL: 122.51
Triglycerides: 72 mg/dL (ref 0.0–149.0)
VLDL: 14.4 mg/dL (ref 0.0–40.0)

## 2015-07-15 LAB — CBC WITH DIFFERENTIAL/PLATELET
BASOS ABS: 0 10*3/uL (ref 0.0–0.1)
BASOS PCT: 0.9 % (ref 0.0–3.0)
EOS ABS: 0.2 10*3/uL (ref 0.0–0.7)
Eosinophils Relative: 4.5 % (ref 0.0–5.0)
HEMATOCRIT: 44.2 % (ref 36.0–46.0)
HEMOGLOBIN: 14.7 g/dL (ref 12.0–15.0)
LYMPHS PCT: 46.8 % — AB (ref 12.0–46.0)
Lymphs Abs: 2.4 10*3/uL (ref 0.7–4.0)
MCHC: 33.2 g/dL (ref 30.0–36.0)
MCV: 92.2 fl (ref 78.0–100.0)
MONO ABS: 0.5 10*3/uL (ref 0.1–1.0)
Monocytes Relative: 9.7 % (ref 3.0–12.0)
Neutro Abs: 1.9 10*3/uL (ref 1.4–7.7)
Neutrophils Relative %: 38.1 % — ABNORMAL LOW (ref 43.0–77.0)
Platelets: 235 10*3/uL (ref 150.0–400.0)
RBC: 4.8 Mil/uL (ref 3.87–5.11)
RDW: 14.1 % (ref 11.5–15.5)
WBC: 5.1 10*3/uL (ref 4.0–10.5)

## 2015-07-15 LAB — POCT URINALYSIS DIPSTICK
Bilirubin, UA: NEGATIVE
Glucose, UA: NEGATIVE
KETONES UA: NEGATIVE
Leukocytes, UA: NEGATIVE
Nitrite, UA: NEGATIVE
PH UA: 6
PROTEIN UA: NEGATIVE
RBC UA: NEGATIVE
UROBILINOGEN UA: 0.2

## 2015-07-15 MED ORDER — FUROSEMIDE 40 MG PO TABS: 40.0000 mg | ORAL_TABLET | Freq: Every day | ORAL | Status: DC

## 2015-07-15 NOTE — Progress Notes (Signed)
Subjective:   Sierra Ford is a 75 y.o. female who presents for Medicare Annual (Subsequent) preventive examination.  Review of Systems:   Review of Systems  Constitutional: Negative for activity change, appetite change and fatigue.  HENT: Negative for hearing loss, congestion, tinnitus and ear discharge.   Eyes: Negative for visual disturbance (see optho q1y -- vision corrected to 20/20 with glasses).  Respiratory: Negative for cough, chest tightness and shortness of breath.   Cardiovascular: Negative for chest pain, palpitations and leg swelling.  Gastrointestinal: Negative for abdominal pain, diarrhea, constipation and abdominal distention.  Genitourinary: Negative for urgency, frequency, decreased urine volume and difficulty urinating.  Musculoskeletal: Negative for back pain, arthralgias and gait problem.  Skin: Negative for color change, pallor and rash.  Neurological: Negative for dizziness, light-headedness, numbness and headaches.  Hematological: Negative for adenopathy. Does not bruise/bleed easily.  Psychiatric/Behavioral: Negative for suicidal ideas, confusion, sleep disturbance, self-injury, dysphoric mood, decreased concentration and agitation.  Pt is able to read and write and can do all ADLs No risk for falling No abuse/ violence in home          Objective:     Vitals: BP 112/64 mmHg  Pulse 64  Ht '5\' 3"'  (1.6 m)  Wt 179 lb 9.6 oz (81.466 kg)  BMI 31.82 kg/m2  SpO2 98%  Body mass index is 31.82 kg/(m^2). BP 112/64 mmHg  Pulse 64  Ht '5\' 3"'  (1.6 m)  Wt 179 lb 9.6 oz (81.466 kg)  BMI 31.82 kg/m2  SpO2 98% General appearance: alert, cooperative, appears stated age and no distress Head: Normocephalic, without obvious abnormality, atraumatic Eyes: negative findings: lids and lashes normal, conjunctivae and sclerae normal and pupils equal, round, reactive to light and accomodation Ears: normal TM's and external ear canals both ears Nose: Nares normal.  Septum midline. Mucosa normal. No drainage or sinus tenderness. Throat: lips, mucosa, and tongue normal; teeth and gums normal Neck: no adenopathy, supple, symmetrical, trachea midline and thyroid not enlarged, symmetric, no tenderness/mass/nodules Back: symmetric, no curvature. ROM normal. No CVA tenderness. Lungs: clear to auscultation bilaterally Breasts: gyn Heart: regular rate and rhythm, S1, S2 normal, no murmur, click, rub or gallop Abdomen: soft, non-tender; bowel sounds normal; no masses,  no organomegaly Pelvic: deferred-gyn Extremities: extremities normal, atraumatic, no cyanosis or edema Pulses: 2+ and symmetric Skin: Skin color, texture, turgor normal. No rashes or lesions Lymph nodes: Cervical, supraclavicular, and axillary nodes normal. Neurologic: Alert and oriented X 3, normal strength and tone. Normal symmetric reflexes. Normal coordination and gait Psych- no depression, no anxiety  Tobacco History  Smoking status  . Former Smoker -- 1.00 packs/day for 50 years  . Types: Cigarettes  . Quit date: 11/21/2009  Smokeless tobacco  . Never Used     Counseling given: Not Answered   Past Medical History  Diagnosis Date  . DVT (deep venous thrombosis) ([place]) 2006    hx of  . Hyperlipidemia   . PE (pulmonary embolism) 2006    hx of  . UTI (lower urinary tract infection)     hx  . Ectopic pregnancy with intrauterine pregnancy   . Chest pain     CLite with apical ischemia in 2006 - normal coronary arteries by [facility] in 12/2004;  Myoview 11/12:  Low risk stress nuclear study with a small, partially reversible apical defect most likely related to apical thinning; cannot R/O very mild apical ischemia.  EF: 75%   . Hiatal hernia    Past Surgical  History  Procedure Laterality Date  . Foot surgery    . Abdominal exploration surgery    . Ectopic pregnancy surgery    . Bunionectomy      right   Family History  Problem Relation Age of Onset  . Heart failure Mother      died from  . Colon polyps Mother   . Diabetes Maternal Games developer  . Diabetes Maternal Aunt    History  Sexual Activity  . Sexual Activity:  . Partners: Male    Outpatient Encounter Prescriptions as of 07/15/2015  Medication Sig  . aspirin EC 81 MG tablet Take 81 mg by mouth at bedtime.    Marland Kitchen CALCIUM-VITAMIN D PO Take 600 Units by mouth 2 (two) times daily.    . cholecalciferol (VITAMIN D-400) 400 UNITS TABS Take 400 Units by mouth 2 (two) times daily.   . Coenzyme Q10 (CO Q 10 PO) Take 1 tablet by mouth daily.  Marland Kitchen ezetimibe (ZETIA) 10 MG tablet Take 1 tablet (10 mg total) by mouth daily.  . Multiple Vitamin (MULTIVITAMIN PO) Take 1 tablet by mouth at bedtime.   . rosuvastatin (CRESTOR) 20 MG tablet Take 0.5 tablets (10 mg total) by mouth daily.  . [DISCONTINUED] furosemide (LASIX) 20 MG tablet TAKE ONE TABLET BY MOUTH ONCE DAILY  . furosemide (LASIX) 40 MG tablet Take 1 tablet (40 mg total) by mouth daily.   No facility-administered encounter medications on file as of 07/15/2015.    Activities of Daily Living In your present state of health, do you have any difficulty performing the following activities: 07/15/2015  Hearing? Y  Vision? N  Difficulty concentrating or making decisions? Y  Walking or climbing stairs? Y  Dressing or bathing? N  Doing errands, shopping? N    Patient Care Team: Ann Held, DO as PCP - General W Evette Cristal, MD as Consulting Physician (Obstetrics and Gynecology) Minus Breeding, MD as Consulting Physician (Cardiology) Collene Gobble, MD as Consulting Physician (Pulmonary Disease)    Assessment:     Exercise Activities and Dietary recommendations- WATER AEROBIC DAILY    Goals    . LDL CALC < 100      Fall Risk Fall Risk  07/15/2015 05/20/2014 04/24/2013  Falls in the past year? No No No   Depression Screen PHQ 2/9 Scores 07/15/2015 05/20/2014 04/24/2013  PHQ - 2 Score 0 1 0     Cognitive Testing MMSE - Mini Mental State  Exam 07/15/2015  Orientation to time 5  Orientation to Place 5  Registration 3  Attention/ Calculation 5  Recall 3  Language- name 2 objects 2  Language- repeat 1  Language- follow 3 step command 3  Language- read & follow direction 1  Write a sentence 1  Copy design 1  Total score 30    Immunization History  Administered Date(s) Administered  . Pneumococcal Conjugate-13 04/24/2013  . Pneumococcal Polysaccharide-23 08/07/2007  . Td 03/03/2003  . Zoster 08/14/2007, 11/13/2007   Screening Tests Health Maintenance  Topic Date Due  . INFLUENZA VACCINE  03/23/2016 (Originally 09/20/2015)  . TETANUS/TDAP  07/14/2016 (Originally 03/02/2013)  . MAMMOGRAM  11/19/2016  . COLONOSCOPY  10/22/2017  . DEXA SCAN  Completed  . ZOSTAVAX  Completed  . PNA vac Low Risk Adult  Completed      Plan:   see AVS During the course of the visit the patient was educated and counseled about the following appropriate screening and preventive  services:   Vaccines to include Pneumoccal, Influenza, Hepatitis B, Td, Zostavax, HCV  Electrocardiogram  Cardiovascular Disease  Colorectal cancer screening  Bone density screening  Diabetes screening  Glaucoma screening  Mammography/PAP  Nutrition counseling   Patient Instructions (the written plan) was given to the patient.  1. Hyperlipidemia con't crestor and zetia Check labs - Comprehensive metabolic panel - CBC with Differential/Platelet - Lipid panel - POCT urinalysis dipstick - Lipid panel; Future  2. Essential hypertension Stable - Comprehensive metabolic panel - CBC with Differential/Platelet - Lipid panel - POCT urinalysis dipstick - furosemide (LASIX) 40 MG tablet; Take 1 tablet (40 mg total) by mouth daily.  Dispense: 90 tablet; Refill: 3 - CBC with Differential/Platelet; Future - Comp Met (CMET); Future  3. Bilateral edema of lower extremity con't lasix - furosemide (LASIX) 40 MG tablet; Take 1 tablet (40 mg total) by  mouth daily.  Dispense: 90 tablet; Refill: 3  4. Medicare annual wellness visit, subsequent  See above  5. Routine history and physical examination of adult    Ann Held, DO  07/20/2015

## 2015-07-15 NOTE — Patient Instructions (Signed)
Preventive Care for Adults, Female A healthy lifestyle and preventive care can promote health and wellness. Preventive health guidelines for women include the following key practices.  A routine yearly physical is a good way to check with your health care provider about your health and preventive screening. It is a chance to share any concerns and updates on your health and to receive a thorough exam.  Visit your dentist for a routine exam and preventive care every 6 months. Brush your teeth twice a day and floss once a day. Good oral hygiene prevents tooth decay and gum disease.  The frequency of eye exams is based on your age, health, family medical history, use of contact lenses, and other factors. Follow your health care provider's recommendations for frequency of eye exams.  Eat a healthy diet. Foods like vegetables, fruits, whole grains, low-fat dairy products, and lean protein foods contain the nutrients you need without too many calories. Decrease your intake of foods high in solid fats, added sugars, and salt. Eat the right amount of calories for you.Get information about a proper diet from your health care provider, if necessary.  Regular physical exercise is one of the most important things you can do for your health. Most adults should get at least 150 minutes of moderate-intensity exercise (any activity that increases your heart rate and causes you to sweat) each week. In addition, most adults need muscle-strengthening exercises on 2 or more days a week.  Maintain a healthy weight. The body mass index (BMI) is a screening tool to identify possible weight problems. It provides an estimate of body fat based on height and weight. Your health care provider can find your BMI and can help you achieve or maintain a healthy weight.For adults 20 years and older:  A BMI below 18.5 is considered underweight.  A BMI of 18.5 to 24.9 is normal.  A BMI of 25 to 29.9 is considered overweight.  A  BMI of 30 and above is considered obese.  Maintain normal blood lipids and cholesterol levels by exercising and minimizing your intake of saturated fat. Eat a balanced diet with plenty of fruit and vegetables. Blood tests for lipids and cholesterol should begin at age 45 and be repeated every 5 years. If your lipid or cholesterol levels are high, you are over 50, or you are at high risk for heart disease, you may need your cholesterol levels checked more frequently.Ongoing high lipid and cholesterol levels should be treated with medicines if diet and exercise are not working.  If you smoke, find out from your health care provider how to quit. If you do not use tobacco, do not start.  Lung cancer screening is recommended for adults aged 45-80 years who are at high risk for developing lung cancer because of a history of smoking. A yearly low-dose CT scan of the lungs is recommended for people who have at least a 30-pack-year history of smoking and are a current smoker or have quit within the past 15 years. A pack year of smoking is smoking an average of 1 pack of cigarettes a day for 1 year (for example: 1 pack a day for 30 years or 2 packs a day for 15 years). Yearly screening should continue until the smoker has stopped smoking for at least 15 years. Yearly screening should be stopped for people who develop a health problem that would prevent them from having lung cancer treatment.  If you are pregnant, do not drink alcohol. If you are  breastfeeding, be very cautious about drinking alcohol. If you are not pregnant and choose to drink alcohol, do not have more than 1 drink per day. One drink is considered to be 12 ounces (355 mL) of beer, 5 ounces (148 mL) of wine, or 1.5 ounces (44 mL) of liquor.  Avoid use of street drugs. Do not share needles with anyone. Ask for help if you need support or instructions about stopping the use of drugs.  High blood pressure causes heart disease and increases the risk  of stroke. Your blood pressure should be checked at least every 1 to 2 years. Ongoing high blood pressure should be treated with medicines if weight loss and exercise do not work.  If you are 55-79 years old, ask your health care provider if you should take aspirin to prevent strokes.  Diabetes screening is done by taking a blood sample to check your blood glucose level after you have not eaten for a certain period of time (fasting). If you are not overweight and you do not have risk factors for diabetes, you should be screened once every 3 years starting at age 45. If you are overweight or obese and you are 40-70 years of age, you should be screened for diabetes every year as part of your cardiovascular risk assessment.  Breast cancer screening is essential preventive care for women. You should practice "breast self-awareness." This means understanding the normal appearance and feel of your breasts and may include breast self-examination. Any changes detected, no matter how small, should be reported to a health care provider. Women in their 20s and 30s should have a clinical breast exam (CBE) by a health care provider as part of a regular health exam every 1 to 3 years. After age 40, women should have a CBE every year. Starting at age 40, women should consider having a mammogram (breast X-ray test) every year. Women who have a family history of breast cancer should talk to their health care provider about genetic screening. Women at a high risk of breast cancer should talk to their health care providers about having an MRI and a mammogram every year.  Breast cancer gene (BRCA)-related cancer risk assessment is recommended for women who have family members with BRCA-related cancers. BRCA-related cancers include breast, ovarian, tubal, and peritoneal cancers. Having family members with these cancers may be associated with an increased risk for harmful changes (mutations) in the breast cancer genes BRCA1 and  BRCA2. Results of the assessment will determine the need for genetic counseling and BRCA1 and BRCA2 testing.  Your health care provider may recommend that you be screened regularly for cancer of the pelvic organs (ovaries, uterus, and vagina). This screening involves a pelvic examination, including checking for microscopic changes to the surface of your cervix (Pap test). You may be encouraged to have this screening done every 3 years, beginning at age 21.  For women ages 30-65, health care providers may recommend pelvic exams and Pap testing every 3 years, or they may recommend the Pap and pelvic exam, combined with testing for human papilloma virus (HPV), every 5 years. Some types of HPV increase your risk of cervical cancer. Testing for HPV may also be done on women of any age with unclear Pap test results.  Other health care providers may not recommend any screening for nonpregnant women who are considered low risk for pelvic cancer and who do not have symptoms. Ask your health care provider if a screening pelvic exam is right for   you.  If you have had past treatment for cervical cancer or a condition that could lead to cancer, you need Pap tests and screening for cancer for at least 20 years after your treatment. If Pap tests have been discontinued, your risk factors (such as having a new sexual partner) need to be reassessed to determine if screening should resume. Some women have medical problems that increase the chance of getting cervical cancer. In these cases, your health care provider may recommend more frequent screening and Pap tests.  Colorectal cancer can be detected and often prevented. Most routine colorectal cancer screening begins at the age of 50 years and continues through age 75 years. However, your health care provider may recommend screening at an earlier age if you have risk factors for colon cancer. On a yearly basis, your health care provider may provide home test kits to check  for hidden blood in the stool. Use of a small camera at the end of a tube, to directly examine the colon (sigmoidoscopy or colonoscopy), can detect the earliest forms of colorectal cancer. Talk to your health care provider about this at age 50, when routine screening begins. Direct exam of the colon should be repeated every 5-10 years through age 75 years, unless early forms of precancerous polyps or small growths are found.  People who are at an increased risk for hepatitis B should be screened for this virus. You are considered at high risk for hepatitis B if:  You were born in a country where hepatitis B occurs often. Talk with your health care provider about which countries are considered high risk.  Your parents were born in a high-risk country and you have not received a shot to protect against hepatitis B (hepatitis B vaccine).  You have HIV or AIDS.  You use needles to inject street drugs.  You live with, or have sex with, someone who has hepatitis B.  You get hemodialysis treatment.  You take certain medicines for conditions like cancer, organ transplantation, and autoimmune conditions.  Hepatitis C blood testing is recommended for all people born from 1945 through 1965 and any individual with known risks for hepatitis C.  Practice safe sex. Use condoms and avoid high-risk sexual practices to reduce the spread of sexually transmitted infections (STIs). STIs include gonorrhea, chlamydia, syphilis, trichomonas, herpes, HPV, and human immunodeficiency virus (HIV). Herpes, HIV, and HPV are viral illnesses that have no cure. They can result in disability, cancer, and death.  You should be screened for sexually transmitted illnesses (STIs) including gonorrhea and chlamydia if:  You are sexually active and are younger than 24 years.  You are older than 24 years and your health care provider tells you that you are at risk for this type of infection.  Your sexual activity has changed  since you were last screened and you are at an increased risk for chlamydia or gonorrhea. Ask your health care provider if you are at risk.  If you are at risk of being infected with HIV, it is recommended that you take a prescription medicine daily to prevent HIV infection. This is called preexposure prophylaxis (PrEP). You are considered at risk if:  You are sexually active and do not regularly use condoms or know the HIV status of your partner(s).  You take drugs by injection.  You are sexually active with a partner who has HIV.  Talk with your health care provider about whether you are at high risk of being infected with HIV. If   you choose to begin PrEP, you should first be tested for HIV. You should then be tested every 3 months for as long as you are taking PrEP.  Osteoporosis is a disease in which the bones lose minerals and strength with aging. This can result in serious bone fractures or breaks. The risk of osteoporosis can be identified using a bone density scan. Women ages 67 years and over and women at risk for fractures or osteoporosis should discuss screening with their health care providers. Ask your health care provider whether you should take a calcium supplement or vitamin D to reduce the rate of osteoporosis.  Menopause can be associated with physical symptoms and risks. Hormone replacement therapy is available to decrease symptoms and risks. You should talk to your health care provider about whether hormone replacement therapy is right for you.  Use sunscreen. Apply sunscreen liberally and repeatedly throughout the day. You should seek shade when your shadow is shorter than you. Protect yourself by wearing long sleeves, pants, a wide-brimmed hat, and sunglasses year round, whenever you are outdoors.  Once a month, do a whole body skin exam, using a mirror to look at the skin on your back. Tell your health care provider of new moles, moles that have irregular borders, moles that  are larger than a pencil eraser, or moles that have changed in shape or color.  Stay current with required vaccines (immunizations).  Influenza vaccine. All adults should be immunized every year.  Tetanus, diphtheria, and acellular pertussis (Td, Tdap) vaccine. Pregnant women should receive 1 dose of Tdap vaccine during each pregnancy. The dose should be obtained regardless of the length of time since the last dose. Immunization is preferred during the 27th-36th week of gestation. An adult who has not previously received Tdap or who does not know her vaccine status should receive 1 dose of Tdap. This initial dose should be followed by tetanus and diphtheria toxoids (Td) booster doses every 10 years. Adults with an unknown or incomplete history of completing a 3-dose immunization series with Td-containing vaccines should begin or complete a primary immunization series including a Tdap dose. Adults should receive a Td booster every 10 years.  Varicella vaccine. An adult without evidence of immunity to varicella should receive 2 doses or a second dose if she has previously received 1 dose. Pregnant females who do not have evidence of immunity should receive the first dose after pregnancy. This first dose should be obtained before leaving the health care facility. The second dose should be obtained 4-8 weeks after the first dose.  Human papillomavirus (HPV) vaccine. Females aged 13-26 years who have not received the vaccine previously should obtain the 3-dose series. The vaccine is not recommended for use in pregnant females. However, pregnancy testing is not needed before receiving a dose. If a female is found to be pregnant after receiving a dose, no treatment is needed. In that case, the remaining doses should be delayed until after the pregnancy. Immunization is recommended for any person with an immunocompromised condition through the age of 61 years if she did not get any or all doses earlier. During the  3-dose series, the second dose should be obtained 4-8 weeks after the first dose. The third dose should be obtained 24 weeks after the first dose and 16 weeks after the second dose.  Zoster vaccine. One dose is recommended for adults aged 30 years or older unless certain conditions are present.  Measles, mumps, and rubella (MMR) vaccine. Adults born  before 1957 generally are considered immune to measles and mumps. Adults born in 1957 or later should have 1 or more doses of MMR vaccine unless there is a contraindication to the vaccine or there is laboratory evidence of immunity to each of the three diseases. A routine second dose of MMR vaccine should be obtained at least 28 days after the first dose for students attending postsecondary schools, health care workers, or international travelers. People who received inactivated measles vaccine or an unknown type of measles vaccine during 1963-1967 should receive 2 doses of MMR vaccine. People who received inactivated mumps vaccine or an unknown type of mumps vaccine before 1979 and are at high risk for mumps infection should consider immunization with 2 doses of MMR vaccine. For females of childbearing age, rubella immunity should be determined. If there is no evidence of immunity, females who are not pregnant should be vaccinated. If there is no evidence of immunity, females who are pregnant should delay immunization until after pregnancy. Unvaccinated health care workers born before 1957 who lack laboratory evidence of measles, mumps, or rubella immunity or laboratory confirmation of disease should consider measles and mumps immunization with 2 doses of MMR vaccine or rubella immunization with 1 dose of MMR vaccine.  Pneumococcal 13-valent conjugate (PCV13) vaccine. When indicated, a person who is uncertain of his immunization history and has no record of immunization should receive the PCV13 vaccine. All adults 65 years of age and older should receive this  vaccine. An adult aged 19 years or older who has certain medical conditions and has not been previously immunized should receive 1 dose of PCV13 vaccine. This PCV13 should be followed with a dose of pneumococcal polysaccharide (PPSV23) vaccine. Adults who are at high risk for pneumococcal disease should obtain the PPSV23 vaccine at least 8 weeks after the dose of PCV13 vaccine. Adults older than 74 years of age who have normal immune system function should obtain the PPSV23 vaccine dose at least 1 year after the dose of PCV13 vaccine.  Pneumococcal polysaccharide (PPSV23) vaccine. When PCV13 is also indicated, PCV13 should be obtained first. All adults aged 65 years and older should be immunized. An adult younger than age 65 years who has certain medical conditions should be immunized. Any person who resides in a nursing home or long-term care facility should be immunized. An adult smoker should be immunized. People with an immunocompromised condition and certain other conditions should receive both PCV13 and PPSV23 vaccines. People with human immunodeficiency virus (HIV) infection should be immunized as soon as possible after diagnosis. Immunization during chemotherapy or radiation therapy should be avoided. Routine use of PPSV23 vaccine is not recommended for American Indians, [place] Natives, or people younger than 65 years unless there are medical conditions that require PPSV23 vaccine. When indicated, people who have unknown immunization and have no record of immunization should receive PPSV23 vaccine. One-time revaccination 5 years after the first dose of PPSV23 is recommended for people aged 19-64 years who have chronic kidney failure, nephrotic syndrome, asplenia, or immunocompromised conditions. People who received 1-2 doses of PPSV23 before age 65 years should receive another dose of PPSV23 vaccine at age 65 years or later if at least 5 years have passed since the previous dose. Doses of PPSV23 are not  needed for people immunized with PPSV23 at or after age 65 years.  Meningococcal vaccine. Adults with asplenia or persistent complement component deficiencies should receive 2 doses of quadrivalent meningococcal conjugate (MenACWY-D) vaccine. The doses should be obtained   at least 2 months apart. Microbiologists working with certain meningococcal bacteria, [place] recruits, people at risk during an outbreak, and people who travel to or live in countries with a high rate of meningitis should be immunized. A first-year college student up through age 34 years who is living in a residence hall should receive a dose if she did not receive a dose on or after her 16th birthday. Adults who have certain high-risk conditions should receive one or more doses of vaccine.  Hepatitis A vaccine. Adults who wish to be protected from this disease, have certain high-risk conditions, work with hepatitis A-infected animals, work in hepatitis A research labs, or travel to or work in countries with a high rate of hepatitis A should be immunized. Adults who were previously unvaccinated and who anticipate close contact with an international adoptee during the first 60 days after arrival in the [place] from a country with a high rate of hepatitis A should be immunized.  Hepatitis B vaccine. Adults who wish to be protected from this disease, have certain high-risk conditions, may be exposed to blood or other infectious body fluids, are household contacts or sex partners of hepatitis B positive people, are clients or workers in certain care facilities, or travel to or work in countries with a high rate of hepatitis B should be immunized.  Haemophilus influenzae type b (Hib) vaccine. A previously unvaccinated person with asplenia or sickle cell disease or having a scheduled splenectomy should receive 1 dose of Hib vaccine. Regardless of previous immunization, a recipient of a hematopoietic stem cell transplant should receive a  3-dose series 6-12 months after her successful transplant. Hib vaccine is not recommended for adults with HIV infection. Preventive Services / Frequency Ages 35 to 4 years  Blood pressure check.** / Every 3-5 years.  Lipid and cholesterol check.** / Every 5 years beginning at age 60.  Clinical breast exam.** / Every 3 years for women in their 71s and 10s.  BRCA-related cancer risk assessment.** / For women who have family members with a BRCA-related cancer (breast, ovarian, tubal, or peritoneal cancers).  Pap test.** / Every 2 years from ages 76 through 26. Every 3 years starting at age 61 through age 76 or 93 with a history of 3 consecutive normal Pap tests.  HPV screening.** / Every 3 years from ages 37 through ages 60 to 51 with a history of 3 consecutive normal Pap tests.  Hepatitis C blood test.** / For any individual with known risks for hepatitis C.  Skin self-exam. / Monthly.  Influenza vaccine. / Every year.  Tetanus, diphtheria, and acellular pertussis (Tdap, Td) vaccine.** / Consult your health care provider. Pregnant women should receive 1 dose of Tdap vaccine during each pregnancy. 1 dose of Td every 10 years.  Varicella vaccine.** / Consult your health care provider. Pregnant females who do not have evidence of immunity should receive the first dose after pregnancy.  HPV vaccine. / 3 doses over 6 months, if 93 and younger. The vaccine is not recommended for use in pregnant females. However, pregnancy testing is not needed before receiving a dose.  Measles, mumps, rubella (MMR) vaccine.** / You need at least 1 dose of MMR if you were born in 1957 or later. You may also need a 2nd dose. For females of childbearing age, rubella immunity should be determined. If there is no evidence of immunity, females who are not pregnant should be vaccinated. If there is no evidence of immunity, females who are  pregnant should delay immunization until after pregnancy.  Pneumococcal  13-valent conjugate (PCV13) vaccine.** / Consult your health care provider.  Pneumococcal polysaccharide (PPSV23) vaccine.** / 1 to 2 doses if you smoke cigarettes or if you have certain conditions.  Meningococcal vaccine.** / 1 dose if you are age 68 to 8 years and a Market researcher living in a residence hall, or have one of several medical conditions, you need to get vaccinated against meningococcal disease. You may also need additional booster doses.  Hepatitis A vaccine.** / Consult your health care provider.  Hepatitis B vaccine.** / Consult your health care provider.  Haemophilus influenzae type b (Hib) vaccine.** / Consult your health care provider. Ages 7 to 53 years  Blood pressure check.** / Every year.  Lipid and cholesterol check.** / Every 5 years beginning at age 25 years.  Lung cancer screening. / Every year if you are aged 11-80 years and have a 30-pack-year history of smoking and currently smoke or have quit within the past 15 years. Yearly screening is stopped once you have quit smoking for at least 15 years or develop a health problem that would prevent you from having lung cancer treatment.  Clinical breast exam.** / Every year after age 48 years.  BRCA-related cancer risk assessment.** / For women who have family members with a BRCA-related cancer (breast, ovarian, tubal, or peritoneal cancers).  Mammogram.** / Every year beginning at age 41 years and continuing for as long as you are in good health. Consult with your health care provider.  Pap test.** / Every 3 years starting at age 65 years through age 37 or 70 years with a history of 3 consecutive normal Pap tests.  HPV screening.** / Every 3 years from ages 72 years through ages 60 to 40 years with a history of 3 consecutive normal Pap tests.  Fecal occult blood test (FOBT) of stool. / Every year beginning at age 21 years and continuing until age 5 years. You may not need to do this test if you get  a colonoscopy every 10 years.  Flexible sigmoidoscopy or colonoscopy.** / Every 5 years for a flexible sigmoidoscopy or every 10 years for a colonoscopy beginning at age 35 years and continuing until age 48 years.  Hepatitis C blood test.** / For all people born from [birth year] through 1965 and any individual with known risks for hepatitis C.  Skin self-exam. / Monthly.  Influenza vaccine. / Every year.  Tetanus, diphtheria, and acellular pertussis (Tdap/Td) vaccine.** / Consult your health care provider. Pregnant women should receive 1 dose of Tdap vaccine during each pregnancy. 1 dose of Td every 10 years.  Varicella vaccine.** / Consult your health care provider. Pregnant females who do not have evidence of immunity should receive the first dose after pregnancy.  Zoster vaccine.** / 1 dose for adults aged 30 years or older.  Measles, mumps, rubella (MMR) vaccine.** / You need at least 1 dose of MMR if you were born in 1957 or later. You may also need a second dose. For females of childbearing age, rubella immunity should be determined. If there is no evidence of immunity, females who are not pregnant should be vaccinated. If there is no evidence of immunity, females who are pregnant should delay immunization until after pregnancy.  Pneumococcal 13-valent conjugate (PCV13) vaccine.** / Consult your health care provider.  Pneumococcal polysaccharide (PPSV23) vaccine.** / 1 to 2 doses if you smoke cigarettes or if you have certain conditions.  Meningococcal vaccine.** /  Consult your health care provider.  Hepatitis A vaccine.** / Consult your health care provider.  Hepatitis B vaccine.** / Consult your health care provider.  Haemophilus influenzae type b (Hib) vaccine.** / Consult your health care provider. Ages 64 years and over  Blood pressure check.** / Every year.  Lipid and cholesterol check.** / Every 5 years beginning at age 23 years.  Lung cancer screening. / Every year if you  are aged 16-80 years and have a 30-pack-year history of smoking and currently smoke or have quit within the past 15 years. Yearly screening is stopped once you have quit smoking for at least 15 years or develop a health problem that would prevent you from having lung cancer treatment.  Clinical breast exam.** / Every year after age 74 years.  BRCA-related cancer risk assessment.** / For women who have family members with a BRCA-related cancer (breast, ovarian, tubal, or peritoneal cancers).  Mammogram.** / Every year beginning at age 44 years and continuing for as long as you are in good health. Consult with your health care provider.  Pap test.** / Every 3 years starting at age 58 years through age 22 or 39 years with 3 consecutive normal Pap tests. Testing can be stopped between 65 and 70 years with 3 consecutive normal Pap tests and no abnormal Pap or HPV tests in the past 10 years.  HPV screening.** / Every 3 years from ages 64 years through ages 70 or 61 years with a history of 3 consecutive normal Pap tests. Testing can be stopped between 65 and 70 years with 3 consecutive normal Pap tests and no abnormal Pap or HPV tests in the past 10 years.  Fecal occult blood test (FOBT) of stool. / Every year beginning at age 40 years and continuing until age 27 years. You may not need to do this test if you get a colonoscopy every 10 years.  Flexible sigmoidoscopy or colonoscopy.** / Every 5 years for a flexible sigmoidoscopy or every 10 years for a colonoscopy beginning at age 7 years and continuing until age 32 years.  Hepatitis C blood test.** / For all people born from [birth year] through 1964 and any individual with known risks for hepatitis C.  Osteoporosis screening.** / A one-time screening for women ages 30 years and over and women at risk for fractures or osteoporosis.  Skin self-exam. / Monthly.  Influenza vaccine. / Every year.  Tetanus, diphtheria, and acellular pertussis (Tdap/Td)  vaccine.** / 1 dose of Td every 10 years.  Varicella vaccine.** / Consult your health care provider.  Zoster vaccine.** / 1 dose for adults aged 35 years or older.  Pneumococcal 13-valent conjugate (PCV13) vaccine.** / Consult your health care provider.  Pneumococcal polysaccharide (PPSV23) vaccine.** / 1 dose for all adults aged 46 years and older.  Meningococcal vaccine.** / Consult your health care provider.  Hepatitis A vaccine.** / Consult your health care provider.  Hepatitis B vaccine.** / Consult your health care provider.  Haemophilus influenzae type b (Hib) vaccine.** / Consult your health care provider. ** Family history and personal history of risk and conditions may change your health care provider's recommendations.   This information is not intended to replace advice given to you by your health care provider. Make sure you discuss any questions you have with your health care provider.   Document Released: 04/03/2001 Document Revised: 02/26/2014 Document Reviewed: 07/03/2010 Elsevier Interactive Patient Education [employer].

## 2015-07-20 NOTE — Progress Notes (Signed)
HPI The patient presents for evaluation of dyspnea.  She has had some mild diastolic dysfunction and abnormal echo.  Since I last saw her she has done well.  She is doing exercise in the water 6 days per week.  Marland Kitchen She walks on a treadmill.  However, currently she has some heal spur pain.   The patient denies any new symptoms such as chest discomfort, neck or arm discomfort. There has been no new shortness of breath, PND or orthopnea. There have been no reported palpitations, presyncope or syncope.  She has some mild chronic dyspnea walking up a hill.  No Known Allergies  Current Outpatient Prescriptions  Medication Sig Dispense Refill  . aspirin EC 81 MG tablet Take 81 mg by mouth at bedtime.      Marland Kitchen CALCIUM-VITAMIN D PO Take 600 Units by mouth 2 (two) times daily.      . cholecalciferol (VITAMIN D-400) 400 UNITS TABS Take 400 Units by mouth 2 (two) times daily.     . Coenzyme Q10 (CO Q 10 PO) Take 1 tablet by mouth daily.    Marland Kitchen ezetimibe (ZETIA) 10 MG tablet Take 1 tablet (10 mg total) by mouth daily. 90 tablet 3  . furosemide (LASIX) 40 MG tablet Take 1 tablet (40 mg total) by mouth daily. 90 tablet 3  . Multiple Vitamin (MULTIVITAMIN PO) Take 1 tablet by mouth at bedtime.     . Potassium (POTASSIMIN PO) Take 1 tablet by mouth as directed.    . rosuvastatin (CRESTOR) 20 MG tablet Take 0.5 tablets (10 mg total) by mouth daily. 45 tablet 0  . [DISCONTINUED] omega-3 acid ethyl esters (LOVAZA) 1 G capsule Take 1 g by mouth 3 (three) times daily.      . [DISCONTINUED] pravastatin (PRAVACHOL) 20 MG tablet Take 20 mg by mouth daily.       No current facility-administered medications for this visit.    Past Medical History  Diagnosis Date  . DVT (deep venous thrombosis) ([place]) 2006    hx of  . Hyperlipidemia   . PE (pulmonary embolism) 2006    hx of  . UTI (lower urinary tract infection)     hx  . Ectopic pregnancy with intrauterine pregnancy   . Chest pain     CLite with apical ischemia  in 2006 - normal coronary arteries [facility] in 12/2004;  Myoview 11/12:  Low risk stress nuclear study with a small, partially reversible apical defect most likely related to apical thinning; cannot R/O very mild apical ischemia.  EF: 75%   . Hiatal hernia     Past Surgical History  Procedure Laterality Date  . Foot surgery    . Abdominal exploration surgery    . Ectopic pregnancy surgery    . Bunionectomy      right    ROS:  As stated in the HPI and negative for all other systems.  PHYSICAL EXAM BP 104/68 mmHg  Pulse 82  Ht 5\' 2"  (1.575 m)  Wt 175 lb (79.379 kg)  BMI 32.00 kg/m2 GENERAL:  Well appearing NECK:  No jugular venous distention, waveform within normal limits, carotid upstroke brisk and symmetric, no bruits, no thyromegaly LUNGS:  Clear to auscultation bilaterally CHEST:  Unremarkable HEART:  PMI not displaced or sustained,S1 and S2 within normal limits, no S3, no S4, no clicks, no rubs, no murmurs ABD:  Flat, positive bowel sounds normal in frequency in pitch, no bruits, no rebound, no guarding, no midline pulsatile mass,  no hepatomegaly, no splenomegaly EXT:  2 plus pulses throughout, trace edema, no cyanosis no clubbing  EKG:  Sinus rhythm, rate 82, left axis deviation, poor anterior R wave progression, minimal voltage criteria for left ventricular hypertrophy, no acute ST-T wave changes.  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    She is breathing well and participating in risk reduction lifestyle modification. No change in therapy or further imaging is indicated.  EDEMA:  This has been mild.  No further work up is indicated.   HTN:  The blood pressure is at target. No change in medications is indicated. We will continue with therapeutic lifestyle changes (TLC).

## 2015-07-21 ENCOUNTER — Encounter: Payer: Self-pay | Admitting: Cardiology

## 2015-07-21 ENCOUNTER — Ambulatory Visit (INDEPENDENT_AMBULATORY_CARE_PROVIDER_SITE_OTHER): Payer: Medicare Other | Admitting: Cardiology

## 2015-07-21 VITALS — BP 104/68 | HR 82 | Ht 62.0 in | Wt 175.0 lb

## 2015-07-21 DIAGNOSIS — R079 Chest pain, unspecified: Secondary | ICD-10-CM

## 2015-07-21 NOTE — Patient Instructions (Signed)
Your physician wants you to follow-up in: 1 Year. You will receive a reminder letter in the mail two months in advance. If you don't receive a letter, please call our office to schedule the follow-up appointment.  

## 2015-07-22 ENCOUNTER — Ambulatory Visit (INDEPENDENT_AMBULATORY_CARE_PROVIDER_SITE_OTHER): Payer: Medicare Other

## 2015-07-22 ENCOUNTER — Ambulatory Visit (INDEPENDENT_AMBULATORY_CARE_PROVIDER_SITE_OTHER): Payer: Medicare Other | Admitting: Podiatry

## 2015-07-22 ENCOUNTER — Encounter: Payer: Self-pay | Admitting: Podiatry

## 2015-07-22 VITALS — BP 101/69 | HR 72 | Resp 12

## 2015-07-22 DIAGNOSIS — M79671 Pain in right foot: Secondary | ICD-10-CM | POA: Diagnosis not present

## 2015-07-22 DIAGNOSIS — M722 Plantar fascial fibromatosis: Secondary | ICD-10-CM | POA: Diagnosis not present

## 2015-07-22 MED ORDER — TRIAMCINOLONE ACETONIDE 10 MG/ML IJ SUSP
10.0000 mg | Freq: Once | INTRAMUSCULAR | Status: AC
  Administered 2015-07-22: 10 mg

## 2015-07-22 NOTE — Patient Instructions (Signed)
Plantar Fasciitis Plantar fasciitis is a painful foot condition that affects the heel. It occurs when the band of tissue that connects the toes to the heel bone (plantar fascia) becomes irritated. This can happen after exercising too much or doing other repetitive activities (overuse injury). The pain from plantar fasciitis can range from mild irritation to severe pain that makes it difficult for you to walk or move. The pain is usually worse in the morning or after you have been sitting or lying down for a while. CAUSES This condition may be caused by:  Standing for long periods of time.  Wearing shoes that do not fit.  Doing high-impact activities, including running, aerobics, and ballet.  Being overweight.  Having an abnormal way of walking (gait).  Having tight calf muscles.  Having high arches in your feet.  Starting a new athletic activity. SYMPTOMS The main symptom of this condition is heel pain. Other symptoms include:  Pain that gets worse after activity or exercise.  Pain that is worse in the morning or after resting.  Pain that goes away after you walk for a few minutes. DIAGNOSIS This condition may be diagnosed based on your signs and symptoms. Your health care provider will also do a physical exam to check for:  A tender area on the bottom of your foot.  A high arch in your foot.  Pain when you move your foot.  Difficulty moving your foot. You may also need to have imaging studies to confirm the diagnosis. These can include:  X-rays.  Ultrasound.  MRI. TREATMENT  Treatment for plantar fasciitis depends on the severity of the condition. Your treatment may include:  Rest, ice, and over-the-counter pain medicines to manage your pain.  Exercises to stretch your calves and your plantar fascia.  A splint that holds your foot in a stretched, upward position while you sleep (night splint).  Physical therapy to relieve symptoms and prevent problems in the  future.  Cortisone injections to relieve severe pain.  Extracorporeal shock wave therapy (ESWT) to stimulate damaged plantar fascia with electrical impulses. It is often used as a last resort before surgery.  Surgery, if other treatments have not worked after 12 months. HOME CARE INSTRUCTIONS  Take medicines only as directed by your health care provider.  Avoid activities that cause pain.  Roll the bottom of your foot over a bag of ice or a bottle of cold water. Do this for 20 minutes, 3-4 times a day.  Perform simple stretches as directed by your health care provider.  Try wearing athletic shoes with air-sole or gel-sole cushions or soft shoe inserts.  Wear a night splint while sleeping, if directed by your health care provider.  Keep all follow-up appointments with your health care provider. PREVENTION   Do not perform exercises or activities that cause heel pain.  Consider finding low-impact activities if you continue to have problems.  Lose weight if you need to. The best way to prevent plantar fasciitis is to avoid the activities that aggravate your plantar fascia. SEEK MEDICAL CARE IF:  Your symptoms do not go away after treatment with home care measures.  Your pain gets worse.  Your pain affects your ability to move or do your daily activities.   This information is not intended to replace advice given to you by your health care provider. Make sure you discuss any questions you have with your health care provider.   Document Released: 10/31/2000 Document Revised: 10/27/2014 Document Reviewed: 12/16/2013 Elsevier   Interactive Patient Education 2016 Elsevier Inc.  

## 2015-07-22 NOTE — Progress Notes (Signed)
   Subjective:    Patient ID: Sierra Ford, female    DOB: 1941-02-09, 75 y.o.   MRN: [id number]  HPI  Chief Complaint  Patient presents with  . Plantar Fasciitis    ''RT FOOT BOTTOM OF THE HEEL IS PAINFUL.''    PT STATED RT BOTTOM/BACK  OF THE HEEL IS BEEN PAINFUL FOR 3 WEEKS. HEEL IS WORSE WHEN SITTING/STANDING. TRIED NO TREATMENT. Review of Systems  Musculoskeletal: Positive for gait problem.       Objective:   Physical Exam        Assessment & Plan:

## 2015-07-25 NOTE — Progress Notes (Signed)
Subjective:     Patient ID: Sierra Ford, female   DOB: Jun 05, 1940, 76 y.o.   MRN: [id number]  HPI patient presents stating that the right heel has been very painful for the last 3 weeks and it's worse when sitting or standing and currently she is just tried shoe gear modifications   Review of Systems  All other systems reviewed and are negative.      Objective:   Physical Exam  Constitutional: She is oriented to person, place, and time.  Cardiovascular: Intact distal pulses.   Musculoskeletal: Normal range of motion.  Neurological: She is oriented to person, place, and time.  Skin: Skin is warm.  Nursing note and vitals reviewed.  neurovascular status intact muscle strength adequate range of motion within normal limits with patient found to have exquisite discomfort plantar aspect right heel at the insertional point tendon into the calcaneus with moderate depression of the arch noted. 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 Gave instructions on physical therapy and reappoint to recheck  Report indicates that there is a spur formation with no indications of stress fracture or arthritis

## 2015-07-29 ENCOUNTER — Ambulatory Visit (INDEPENDENT_AMBULATORY_CARE_PROVIDER_SITE_OTHER): Payer: Medicare Other | Admitting: Podiatry

## 2015-07-29 ENCOUNTER — Encounter: Payer: Self-pay | Admitting: Podiatry

## 2015-07-29 DIAGNOSIS — M722 Plantar fascial fibromatosis: Secondary | ICD-10-CM | POA: Diagnosis not present

## 2015-07-29 MED ORDER — TRIAMCINOLONE ACETONIDE 10 MG/ML IJ SUSP
10.0000 mg | Freq: Once | INTRAMUSCULAR | Status: AC
  Administered 2015-07-29: 10 mg

## 2015-07-29 NOTE — Progress Notes (Signed)
Subjective:     Patient ID: Sierra Ford, female   DOB: [birth year], 75 y.o.   MRN: [id number]  HPI patient states she still getting pain in her right heel but it has improved some   Review of Systems     Objective:   Physical Exam Neurovascular status intact muscle strength adequate with continued discomfort in the right plantar fascia at the insertional point tendon into the calcaneus with fluid buildup    Assessment:     Plantar fasciitis still noted right heel    Plan:     Reinjected the plantar fascia right 3 mg Kenalog 5 mg Xylocaine and instructed on physical therapy. Reappoint to recheck as needed

## 2015-08-31 ENCOUNTER — Telehealth: Payer: Self-pay | Admitting: Family Medicine

## 2015-08-31 NOTE — Telephone Encounter (Signed)
Patient returning your call.

## 2015-08-31 NOTE — Telephone Encounter (Signed)
Left a message for call back.  

## 2015-08-31 NOTE — Telephone Encounter (Signed)
Spoke to patient and discussed lab results from 07/15/15.  She stated understanding and agreed with plan. Follow up lab appt scheduled.  Future labs ordered.   While on the phone, pt stated that she would like to have her Hemoglobin A1c checked and an order for a sleep study.  Per patient, she is currently in an Alzheimer's study. She says they drew labs and she was told that she was pre-diabetic.  Pt would like to have A1c drawn to verify results.  She says she has diabetes in her family and the thought that she could be prediabetic is scary.  She also says that during the study they also suggested ruling out sleep apnea as a cause of memory loss, therefore patient is requesting an order for a sleep study as well.  Pt states she does "snore a lot" and would like to have study done.   Please advise.

## 2015-08-31 NOTE — Telephone Encounter (Signed)
°  Relationship to patient: Self Can be reached: [phone]     Reason for call: Patient request call back to discuss lab report that she received.

## 2015-09-01 ENCOUNTER — Other Ambulatory Visit: Payer: Self-pay | Admitting: Family Medicine

## 2015-09-01 DIAGNOSIS — R739 Hyperglycemia, unspecified: Secondary | ICD-10-CM

## 2015-09-01 DIAGNOSIS — R0683 Snoring: Secondary | ICD-10-CM

## 2015-09-01 NOTE — Telephone Encounter (Signed)
Orders are in for lab and pulmonary

## 2015-09-02 NOTE — Telephone Encounter (Signed)
Patient has been made aware and verbalized understanding, she will come in on Tuesday and wait for the call for the referral.     KP

## 2015-09-06 ENCOUNTER — Other Ambulatory Visit (INDEPENDENT_AMBULATORY_CARE_PROVIDER_SITE_OTHER): Payer: Medicare Other

## 2015-09-06 DIAGNOSIS — R739 Hyperglycemia, unspecified: Secondary | ICD-10-CM | POA: Diagnosis not present

## 2015-09-06 LAB — BASIC METABOLIC PANEL
BUN: 24 mg/dL — AB (ref 6–23)
CHLORIDE: 103 meq/L (ref 96–112)
CO2: 27 meq/L (ref 19–32)
Calcium: 9.8 mg/dL (ref 8.4–10.5)
Creatinine, Ser: 0.9 mg/dL (ref 0.40–1.20)
GFR: 78.72 mL/min (ref >60.00)
GLUCOSE: 110 mg/dL — AB (ref 70–99)
POTASSIUM: 4.3 meq/L (ref 3.5–5.1)
Sodium: 137 mEq/L (ref 135–145)

## 2015-09-06 LAB — HEMOGLOBIN A1C: Hgb A1c MFr Bld: 6.4 % (ref 4.6–6.5)

## 2015-09-12 ENCOUNTER — Telehealth: Payer: Self-pay | Admitting: Behavioral Health

## 2015-09-12 DIAGNOSIS — R739 Hyperglycemia, unspecified: Secondary | ICD-10-CM

## 2015-09-12 DIAGNOSIS — I10 Essential (primary) hypertension: Secondary | ICD-10-CM

## 2015-09-12 DIAGNOSIS — E785 Hyperlipidemia, unspecified: Secondary | ICD-10-CM

## 2015-09-12 NOTE — Telephone Encounter (Addendum)
-----   Message from Ann Held, DO sent at 09/10/2015 11:14 AM EDT ----- Increase fluid intake Glucose elevated---  Watch simple sugars and starches Lipid, cmp, hgba1c ---- inc 3 months    Order placed for future labs. Lab appointment scheduled for 12/13/15 at 8:00 AM.

## 2015-10-04 ENCOUNTER — Other Ambulatory Visit (INDEPENDENT_AMBULATORY_CARE_PROVIDER_SITE_OTHER): Payer: Medicare Other

## 2015-10-04 DIAGNOSIS — R739 Hyperglycemia, unspecified: Secondary | ICD-10-CM | POA: Diagnosis not present

## 2015-10-04 DIAGNOSIS — E785 Hyperlipidemia, unspecified: Secondary | ICD-10-CM

## 2015-10-04 DIAGNOSIS — I10 Essential (primary) hypertension: Secondary | ICD-10-CM

## 2015-10-04 LAB — CBC WITH DIFFERENTIAL/PLATELET
BASOS ABS: 0 10*3/uL (ref 0.0–0.1)
Basophils Relative: 0.5 % (ref 0.0–3.0)
EOS ABS: 0.3 10*3/uL (ref 0.0–0.7)
Eosinophils Relative: 4.4 % (ref 0.0–5.0)
HEMATOCRIT: 42.3 % (ref 36.0–46.0)
Hemoglobin: 14.5 g/dL (ref 12.0–15.0)
LYMPHS ABS: 2.5 10*3/uL (ref 0.7–4.0)
LYMPHS PCT: 39 % (ref 12.0–46.0)
MCHC: 34.4 g/dL (ref 30.0–36.0)
MCV: 91.8 fl (ref 78.0–100.0)
MONO ABS: 0.5 10*3/uL (ref 0.1–1.0)
Monocytes Relative: 7.3 % (ref 3.0–12.0)
NEUTROS ABS: 3.1 10*3/uL (ref 1.4–7.7)
NEUTROS PCT: 48.8 % (ref 43.0–77.0)
PLATELETS: 272 10*3/uL (ref 150.0–400.0)
RBC: 4.61 Mil/uL (ref 3.87–5.11)
RDW: 13.5 % (ref 11.5–15.5)
WBC: 6.4 10*3/uL (ref 4.0–10.5)

## 2015-10-04 LAB — COMPLETE METABOLIC PANEL WITH GFR
ALBUMIN: 4.1 g/dL (ref 3.6–5.1)
ALK PHOS: 71 U/L (ref 33–130)
ALT: 17 U/L (ref 6–29)
AST: 19 U/L (ref 10–35)
BUN: 19 mg/dL (ref 7–25)
CHLORIDE: 105 mmol/L (ref 98–110)
CO2: 23 mmol/L (ref 20–31)
Calcium: 9.9 mg/dL (ref 8.6–10.4)
Creat: 0.82 mg/dL (ref 0.60–0.93)
GFR, EST NON AFRICAN AMERICAN: 71 mL/min (ref >=60)
GFR, Est African American: 82 mL/min (ref >=60)
GLUCOSE: 110 mg/dL — AB (ref 65–99)
POTASSIUM: 4.2 mmol/L (ref 3.5–5.3)
SODIUM: 138 mmol/L (ref 135–146)
Total Bilirubin: 1.1 mg/dL (ref 0.2–1.2)
Total Protein: 6.8 g/dL (ref 6.1–8.1)

## 2015-10-04 LAB — LIPID PANEL
CHOL/HDL RATIO: 2
Cholesterol: 229 mg/dL — ABNORMAL HIGH (ref 0–200)
HDL: 110.4 mg/dL (ref >39.00)
LDL CALC: 103 mg/dL — AB (ref 0–99)
NONHDL: 118.57
Triglycerides: 80 mg/dL (ref 0.0–149.0)
VLDL: 16 mg/dL (ref 0.0–40.0)

## 2015-10-04 LAB — COMPREHENSIVE METABOLIC PANEL
ALT: 18 U/L (ref 0–35)
AST: 19 U/L (ref 0–37)
Albumin: 4.2 g/dL (ref 3.5–5.2)
Alkaline Phosphatase: 76 U/L (ref 39–117)
BILIRUBIN TOTAL: 1 mg/dL (ref 0.2–1.2)
BUN: 19 mg/dL (ref 6–23)
CHLORIDE: 103 meq/L (ref 96–112)
CO2: 26 meq/L (ref 19–32)
CREATININE: 0.88 mg/dL (ref 0.40–1.20)
Calcium: 10.2 mg/dL (ref 8.4–10.5)
GFR: 80.77 mL/min (ref >60.00)
GLUCOSE: 112 mg/dL — AB (ref 70–99)
Potassium: 4 mEq/L (ref 3.5–5.1)
SODIUM: 138 meq/L (ref 135–145)
Total Protein: 7.2 g/dL (ref 6.0–8.3)

## 2015-10-04 LAB — HEMOGLOBIN A1C: Hgb A1c MFr Bld: 6.5 % (ref 4.6–6.5)

## 2015-10-25 ENCOUNTER — Ambulatory Visit (INDEPENDENT_AMBULATORY_CARE_PROVIDER_SITE_OTHER)
Admission: RE | Admit: 2015-10-25 | Discharge: 2015-10-25 | Disposition: A | Discharge status: Home / Self Care | Payer: Medicare Other | Source: Ambulatory Visit | Attending: Pulmonary Disease | Admitting: Pulmonary Disease

## 2015-10-25 ENCOUNTER — Encounter: Payer: Self-pay | Admitting: Pulmonary Disease

## 2015-10-25 ENCOUNTER — Encounter (INDEPENDENT_AMBULATORY_CARE_PROVIDER_SITE_OTHER): Payer: Self-pay

## 2015-10-25 ENCOUNTER — Ambulatory Visit (INDEPENDENT_AMBULATORY_CARE_PROVIDER_SITE_OTHER): Payer: Medicare Other | Admitting: Pulmonary Disease

## 2015-10-25 DIAGNOSIS — R0609 Other forms of dyspnea: Secondary | ICD-10-CM

## 2015-10-25 DIAGNOSIS — G4710 Hypersomnia, unspecified: Secondary | ICD-10-CM | POA: Diagnosis not present

## 2015-10-25 DIAGNOSIS — E669 Obesity, unspecified: Secondary | ICD-10-CM

## 2015-10-25 NOTE — Assessment & Plan Note (Signed)
Patient with exertional dyspnea. Plan for chest x-ray today. 40 pack year smoking history, quit 5 years ago. May need repeat PFTs. Was seen in 2015 by Dr.Byrum. PFT then was non diagnostic.

## 2015-10-25 NOTE — Progress Notes (Signed)
Subjective:    Patient ID: Sierra Ford, female    DOB: 05-Aug-1940, 75 y.o.   MRN: [id number]  HPI    This is the case of Sierra Ford, 75 y.o. Female, who was referred by Rhys Martini  in consultation regarding possible OSA.   As you very well know, patient is 40PY smoking history and quit 5 yrs ago, not been dxed with asthma or copd.  She has some sinus issues. She was seen by Dr. Lamonte Sakai in 2015 for dyspnea. Workup was unremarkable. Dyspnea eventually improved with time. Still gets exertional dyspnea with more than ADLs.  Patient has snoring, witnessed apneas, gasping, choking. She usually ends up sleeping 4-5 hours during the night. She goes to bed anytime from 11 PM to 1 AM. She wakes up anywhere from 4 to 5 AM. Feels refreshed when she wakes up. She naps daily, lasting 1-2 hours. She naps because it's more out of habit. At the end of a 24-hour period, she ends up sleeping 6-7 hours. She denies abnormal behavior and sleep.  ESS 12  Review of Systems  Constitutional: Negative.  Negative for fever and unexpected weight change.  HENT: Positive for congestion and postnasal drip. Negative for dental problem, ear pain, nosebleeds, rhinorrhea, sinus pressure, sneezing, sore throat and trouble swallowing.   Eyes: Negative.  Negative for redness and itching.  Respiratory: Negative.  Negative for cough, chest tightness, shortness of breath and wheezing.   Cardiovascular: Positive for leg swelling. Negative for palpitations.  Gastrointestinal: Negative.  Negative for nausea and vomiting.  Endocrine: Negative.   Genitourinary: Negative.  Negative for dysuria.  Musculoskeletal: Negative.  Negative for joint swelling.  Skin: Negative.  Negative for rash.  Allergic/Immunologic: Negative.   Neurological: Positive for headaches.  Hematological: Negative.  Does not bruise/bleed easily.  Psychiatric/Behavioral: Negative.  Negative for dysphoric mood. The patient is not nervous/anxious.     Past Medical History:  Diagnosis Date  . Chest pain    CLite with apical ischemia in 2006 - normal coronary arteries by [facility] in 12/2004;  Myoview 11/12:  Low risk stress nuclear study with a small, partially reversible apical defect most likely related to apical thinning; cannot R/O very mild apical ischemia.  EF: 75%   . DVT (deep venous thrombosis) ([place]) 2006   hx of  . Ectopic pregnancy with intrauterine pregnancy   . Hiatal hernia   . Hyperlipidemia   . PE (pulmonary embolism) 2006   hx of  . UTI (lower urinary tract infection)    hx   (-) CA  Family History  Problem Relation Age of Onset  . Heart failure Mother     died from  . Colon polyps Mother   . Diabetes Maternal Games developer  . Diabetes Maternal Aunt      Past Surgical History:  Procedure Laterality Date  . ABDOMINAL EXPLORATION SURGERY    . BUNIONECTOMY     right  . ECTOPIC PREGNANCY SURGERY    . FOOT SURGERY      Social History   Social History  . Marital status: Divorced    Spouse name: N/A  . Number of children: N/A  . Years of education: N/A   Occupational History  . [employer]    Social History Main Topics  . Smoking status: Former Smoker    Packs/day: 1.00    Years: 50.00    Types: Cigarettes    Quit date: 11/21/2009  .  Smokeless tobacco: Never Used  . Alcohol use No  . Drug use: No  . Sexual activity: Not Currently    Partners: Male   Other Topics Concern  . Not on file   Social History Narrative   Regular exercise         Lives in [place].   No Known Allergies   Outpatient Medications Prior to Visit  Medication Sig Dispense Refill  . aspirin EC 81 MG tablet Take 81 mg by mouth at bedtime.      Marland Kitchen CALCIUM-VITAMIN D PO Take 600 Units by mouth 2 (two) times daily.      . cholecalciferol (VITAMIN D-400) 400 UNITS TABS Take 400 Units by mouth 2 (two) times daily.     . Coenzyme Q10 (CO Q 10 PO) Take 1 tablet by mouth daily.    Marland Kitchen ezetimibe (ZETIA) 10 MG  tablet Take 1 tablet (10 mg total) by mouth daily. 90 tablet 3  . furosemide (LASIX) 40 MG tablet Take 1 tablet (40 mg total) by mouth daily. 90 tablet 3  . Multiple Vitamin (MULTIVITAMIN PO) Take 1 tablet by mouth at bedtime.     . Potassium (POTASSIMIN PO) Take 1 tablet by mouth as directed.    . rosuvastatin (CRESTOR) 20 MG tablet Take 0.5 tablets (10 mg total) by mouth daily. 45 tablet 0   No facility-administered medications prior to visit.    No orders of the defined types were placed in this encounter.       Objective:   Physical Exam  Vitals:  Vitals:   10/25/15 1058  BP: 118/68  Pulse: 62  SpO2: 95%  Weight: 183 lb (83 kg)  Height: 5\' 2"  (1.575 m)    Constitutional/General:  Pleasant, well-nourished, well-developed, not in any distress,  Comfortably seating.  Well kempt  Body mass index is 33.47 kg/m. Wt Readings from Last 3 Encounters:  10/25/15 183 lb (83 kg)  07/21/15 175 lb (79.4 kg)  07/15/15 179 lb 9.6 oz (81.5 kg)    Neck circumference: 18 inches  HEENT: Pupils equal and reactive to light and accommodation. Anicteric sclerae. Normal nasal mucosa.   No oral  lesions,  mouth clear,  oropharynx clear, no postnasal drip. (-) Oral thrush. No dental caries.  Airway - Mallampati class III-IV  Neck: No masses. Midline trachea. No JVD, (-) LAD. (-) bruits appreciated.  Respiratory/Chest: Grossly normal chest. (-) deformity. (-) Accessory muscle use.  Symmetric expansion. (-) Tenderness on palpation.  Resonant on percussion.  Diminished BS on both lower lung zones. (-) wheezing, crackles, rhonchi (-) egophony  Cardiovascular: Regular rate and  rhythm, heart sounds normal, no murmur or gallops,Gr 1 edema  Gastrointestinal:  Normal bowel sounds. Soft, non-tender. No hepatosplenomegaly.  (-) masses.   Musculoskeletal:  Normal muscle tone. Normal gait.   Extremities: Grossly normal. (-) clubbing, cyanosis.  (+)  edema  Skin: (-) rash,lesions seen.    Neurological/Psychiatric : alert, oriented to time, place, person. Normal mood and affect          Assessment & Plan:  Hypersomnia Patient has snoring, witnessed apneas, gasping, choking. She usually ends up sleeping 4-5 hours during the night. She goes to bed anytime from 11 PM to 1 AM. She wakes up anywhere from 4 to 5 AM. Feels refreshed when she wakes up. She naps daily, lasting 1-2 hours. She naps because it's more out of habit. At the end of a 24-hour period, she ends up sleeping 6-7 hours. She denies abnormal  behavior and sleep.  ESS 12  Patient was part of a Alzheimer's disease study over at [facility]. Her mother had Alzheimer's dementia. They were not sure whether sleep apnea was contributing to her memory issues.  Plan:  We discussed about the diagnosis of Obstructive Sleep Apnea (OSA) and implications of untreated OSA. We discussed about CPAP and BiPaP as possible treatment options.    We will schedule the patient for a sleep study. Plan for home study. Her sleep schedule is all over the place. If mild sleep apnea, plan to treat as well. If negative, need to reassure patient.   Patient was instructed to call the office if he/she has not heard back from the office 1-2 weeks after the sleep study.   Patient was instructed to call the office if he/she is having issues with the PAP device.   We discussed good sleep hygiene.   Patient was advised not to engage in activities requiring concentration and/or vigilance if he/she is sleepy.  Patient was advised not to drive if he/she is sleepy.    Dyspnea on effort Patient with exertional dyspnea. Plan for chest x-ray today. 40 pack year smoking history, quit 5 years ago. May need repeat PFTs. Was seen in 2015 by Dr.Byrum. PFT then was non diagnostic.   Obesity (BMI 30-39.9) Weight reduction     Thank you very much for letting me participate in this patient's care. Please do not hesitate to give me a call if  you have any questions or concerns regarding the treatment plan.   Patient will follow up with me in 6-8 weeks.     Monica Becton, MD 10/25/2015   11:39 AM Pulmonary and [place] Pager: [phone] Office: [phone], Fax: [phone]  v

## 2015-10-25 NOTE — Assessment & Plan Note (Signed)
Patient has snoring, witnessed apneas, gasping, choking. She usually ends up sleeping 4-5 hours during the night. She goes to bed anytime from 11 PM to 1 AM. She wakes up anywhere from 4 to 5 AM. Feels refreshed when she wakes up. She naps daily, lasting 1-2 hours. She naps because it's more out of habit. At the end of a 24-hour period, she ends up sleeping 6-7 hours. She denies abnormal behavior and sleep.  ESS 12  Patient was part of a Alzheimer's disease study [facility] at [facility]. Her mother had Alzheimer's dementia. They were not sure whether sleep apnea was contributing to her memory issues.  Plan:  We discussed about the diagnosis of Obstructive Sleep Apnea (OSA) and implications of untreated OSA. We discussed about CPAP and BiPaP as possible treatment options.    We will schedule the patient for a sleep study. Plan for home study. Her sleep schedule is all over the place. If mild sleep apnea, plan to treat as well. If negative, need to reassure patient.   Patient was instructed to call the office if he/she has not heard back from the office 1-2 weeks after the sleep study.   Patient was instructed to call the office if he/she is having issues with the PAP device.   We discussed good sleep hygiene.   Patient was advised not to engage in activities requiring concentration and/or vigilance if he/she is sleepy.  Patient was advised not to drive if he/she is sleepy.

## 2015-10-25 NOTE — Patient Instructions (Signed)
It was a pleasure taking care of you today!  We will schedule you to have a sleep study to determine if you have sleep apnea.  We will get a chest x-ray today.  We will get a home sleep test.  You will be instructed to come back to the office to get an apparatus to sleep with overnight.  Once we have the apparatus, it will usually take [name] 1-2 weeks to read the study and get back at you with results of the test.  Please give [name] a call in 2 weeks after your study if you do not hear back from [name].    If the sleep study is positive, we will order you a CPAP  machine.  Please call the office if you do NOT receive your machine in the next 1-2 weeks.   Please make sure you use your CPAP device everytime you sleep.  We will monitor the usage of your machine per your insurance requirement.  Your insurance company may take the machine from you if you are not using it regularly.   Please clean the mask, tubings, filter, water reservoir with soapy water every week.  Please use distilled water for the water reservoir.   Please call the office or your machine provider (DME company) if you are having issues with the device.   Return to clinic in 6-8 weeks with Dr. [name] or NP

## 2015-10-25 NOTE — Assessment & Plan Note (Signed)
Weight reduction 

## 2015-10-26 ENCOUNTER — Telehealth: Payer: Self-pay | Admitting: Pulmonary Disease

## 2015-10-26 NOTE — Telephone Encounter (Signed)
Notes Recorded by Rush Landmark, MD on 10/25/2015 at 1:42 PM EDT pls tell pt CXR was normal. --------------  lmtcb X1 to relay cxr results.

## 2015-10-26 NOTE — Telephone Encounter (Signed)
Patient calling to get CXR results. Aware of results. Patient also calling to check the status of Sleep Study.  She said that she is waiting for someone from our office to call to set up Home Sleep Test.  I see order has been placed.    [facility] - please advise on status of HST for this patient.

## 2015-10-26 NOTE — Telephone Encounter (Signed)
Pt was seen yesterday & order was put in for hst.  I explained then we would be calling her in the next week or 2 to schedule her to pick up machine.  I just called pt & left her vm on home # & cell # letting her know we would be calling her in the next week or so to schedule her hst and left her my phone # in case she has any questions.  Nothing further needed.

## 2015-11-08 ENCOUNTER — Other Ambulatory Visit: Payer: Medicare Other

## 2015-11-08 DIAGNOSIS — G4733 Obstructive sleep apnea (adult) (pediatric): Secondary | ICD-10-CM | POA: Diagnosis not present

## 2015-11-09 ENCOUNTER — Other Ambulatory Visit: Payer: Medicare Other

## 2015-11-10 ENCOUNTER — Ambulatory Visit: Payer: Medicare Other

## 2015-11-10 ENCOUNTER — Telehealth: Payer: Self-pay | Admitting: Pulmonary Disease

## 2015-11-10 NOTE — Telephone Encounter (Signed)
  Please call the pt and tell the pt the Many  showed OSA.   Pt stops breathing 28   times an hour.   Home sleep study was done on : 11/08/15  Pt will need a cpap titration study. pls order and facilitate.   Let me know if you receive this.   Thanks!   J. Shirl Harris, MD 11/10/2015, 1:12 PM  ,

## 2015-11-14 ENCOUNTER — Other Ambulatory Visit: Payer: Medicare Other

## 2015-11-14 NOTE — Telephone Encounter (Signed)
LMTCB

## 2015-11-15 ENCOUNTER — Other Ambulatory Visit: Payer: Medicare Other | Admitting: *Deleted

## 2015-11-15 ENCOUNTER — Telehealth: Payer: Self-pay | Admitting: Pulmonary Disease

## 2015-11-15 DIAGNOSIS — E785 Hyperlipidemia, unspecified: Secondary | ICD-10-CM | POA: Diagnosis not present

## 2015-11-15 LAB — HEPATIC FUNCTION PANEL
ALBUMIN: 3.8 g/dL (ref 3.6–5.1)
ALK PHOS: 71 U/L (ref 33–130)
ALT: 18 U/L (ref 6–29)
AST: 18 U/L (ref 10–35)
BILIRUBIN INDIRECT: 0.9 mg/dL (ref 0.2–1.2)
BILIRUBIN TOTAL: 1.1 mg/dL (ref 0.2–1.2)
Bilirubin, Direct: 0.2 mg/dL (ref <=0.2)
TOTAL PROTEIN: 6.1 g/dL (ref 6.1–8.1)

## 2015-11-15 LAB — LIPID PANEL
Cholesterol: 194 mg/dL (ref 125–200)
HDL: 106 mg/dL (ref >=46)
LDL CALC: 69 mg/dL (ref <130)
TRIGLYCERIDES: 97 mg/dL (ref <150)
Total CHOL/HDL Ratio: 1.8 Ratio (ref <=5.0)
VLDL: 19 mg/dL (ref <30)

## 2015-11-15 NOTE — Telephone Encounter (Signed)
PAtient notified of Sleep Study results. Patient decided to set up appointment with [name] to discuss treatment options vs CPAP.  Patient is leary about trying CPAP. Patient scheduled to see Dr. Murlean Iba on 10/26 at 10am.  Patient aware of appointment Nothing further needed.

## 2015-11-16 ENCOUNTER — Ambulatory Visit (INDEPENDENT_AMBULATORY_CARE_PROVIDER_SITE_OTHER): Payer: Medicare Other | Admitting: Pharmacist

## 2015-11-16 DIAGNOSIS — E785 Hyperlipidemia, unspecified: Secondary | ICD-10-CM

## 2015-11-16 NOTE — Patient Instructions (Addendum)
Your cholesterol looked excellent today.  Continue taking your Crestor 10mg  daily and Zetia 10mg  daily.  Follow up labs on Tuesday, March 27th. Come in any time after 7:30am for fasting blood work. Follow up in lipid clinic with [facility] on Wednesday, March 28th at 1pm to review results.

## 2015-11-16 NOTE — Progress Notes (Signed)
Patient ID: Sierra Ford                 DOB: 1940/09/16                    MRN: [id number]     HPI: Sierra Ford is a 75 y.o. female who presents to lipid clinic for 6 month follow up. She is a longstanding patient of ours, with a history of statin intolerance with atorvastatin, pravastatin, rosuvastatin 20mg  plus Lovaza, but did not tolerate these due to myalgias. Currently taking Crestor 10mg  daily and Zetia 10mg  daily and tolerating them well.  Ms. Shampine is doing well and tolerating her lipid lowering therapy. States she was recently enrolled in a study for Alzheimer's since her mother passed from this. She was told yesterday that she has sleep apnea and also found out that she is prediabetic. She has appropriate follow ups scheduled. She has been taking water aerobics classes.  Current Medications: rosuvastatin 10mg  daily - max tolerated dose, Zetia 10mg  daily Intolerances: rosuvastatin 20mg  - muscle aches, atorvastatin and pravastatin - muscle aches, Lovaza - muscle aches Risk Factors: mild-moderate carotid plaque on ultrasound LDL goal: 100mg /dL - conservative goal. Will aim for <70 due to carotid plaque.  Diet: Has been trying to eat more chicken - does fry it occasionally. Eats a lot of spinach, kale and collard greens. Tries to limit her red meat. Does eat [employer] frequently.  Exercise: Water aerobics classes.  Family History: Mother had heart failure and Alzheimer's disease.  Social History: Smoked cigarettes for 50 years - quit 3 years ago. Minimal alcohol use, no drug use.  Labs: 10/2015: TC 194, TG 97, HDL 106, LDL 69 (Crestor 10mg  daily, Zetia 10mg  daily) 04/2015: TC 232, TG 70, HDL 127, LDL 91, LFTs wnl (Crestor 10mg  daily) 10/2014: TC 262, TG 93, HDL 105.4, LDL 138, LFTs wnl (Crestor 10mg  daily) - admits to many dietary indiscretions this summer 05/2014: TC 184, TG 75, HDL 82.7, LDL 86, LFTs wnl (Crestor 10mg  daily)  Past Medical History:  Diagnosis  Date  . Chest pain    CLite with apical ischemia in 2006 - normal coronary arteries by [facility] in 12/2004;  Myoview 11/12:  Low risk stress nuclear study with a small, partially reversible apical defect most likely related to apical thinning; cannot R/O very mild apical ischemia.  EF: 75%   . DVT (deep venous thrombosis) ([place]) 2006   hx of  . Ectopic pregnancy with intrauterine pregnancy   . Hiatal hernia   . Hyperlipidemia   . PE (pulmonary embolism) 2006   hx of  . UTI (lower urinary tract infection)    hx    Current Outpatient Prescriptions on File Prior to Visit  Medication Sig Dispense Refill  . aspirin EC 81 MG tablet Take 81 mg by mouth at bedtime.      Marland Kitchen CALCIUM-VITAMIN D PO Take 600 Units by mouth 2 (two) times daily.      . cholecalciferol (VITAMIN D-400) 400 UNITS TABS Take 400 Units by mouth 2 (two) times daily.     . Coenzyme Q10 (CO Q 10 PO) Take 1 tablet by mouth daily.    Marland Kitchen ezetimibe (ZETIA) 10 MG tablet Take 1 tablet (10 mg total) by mouth daily. 90 tablet 3  . furosemide (LASIX) 40 MG tablet Take 1 tablet (40 mg total) by mouth daily. 90 tablet 3  . Multiple Vitamin (MULTIVITAMIN PO) Take 1 tablet by mouth  at bedtime.     . Potassium (POTASSIMIN PO) Take 1 tablet by mouth as directed.    . rosuvastatin (CRESTOR) 20 MG tablet Take 0.5 tablets (10 mg total) by mouth daily. 45 tablet 0  . [DISCONTINUED] omega-3 acid ethyl esters (LOVAZA) 1 G capsule Take 1 g by mouth 3 (three) times daily.      . [DISCONTINUED] pravastatin (PRAVACHOL) 20 MG tablet Take 20 mg by mouth daily.       No current facility-administered medications on file prior to visit.     No Known Allergies  Assessment/Plan:  1. Hyperlipidemia - LDL improved to 69 since Crestor dose increased to 20mg  and pt resumed Zetia therapy. She is tolerating both medications well. LDL at goal and no med changes made today. Pt prefers to follow up every 6 months with lipid panel and office visit; this helps with  accountability for lifestyle improvements.    Megan E. Supple, PharmD, [place] [id number], [place], [place] [address] Phone: [phone]; Fax: [phone] 11/16/2015 7:47 AM

## 2015-11-18 ENCOUNTER — Telehealth: Payer: Self-pay | Admitting: Cardiology

## 2015-11-18 NOTE — Telephone Encounter (Signed)
Informed patient of liver and lipid study results. She voiced thanks and understanding. Routed to [facility] for results basket.

## 2015-11-18 NOTE — Telephone Encounter (Signed)
°  Follow Up ° ° °Pt is returning call from yesterday. Please call. °

## 2015-11-21 DIAGNOSIS — H40003 Preglaucoma, unspecified, bilateral: Secondary | ICD-10-CM | POA: Diagnosis not present

## 2015-11-23 ENCOUNTER — Other Ambulatory Visit: Payer: Self-pay | Admitting: *Deleted

## 2015-11-23 DIAGNOSIS — G4733 Obstructive sleep apnea (adult) (pediatric): Secondary | ICD-10-CM | POA: Diagnosis not present

## 2015-11-23 DIAGNOSIS — G4710 Hypersomnia, unspecified: Secondary | ICD-10-CM

## 2015-11-24 NOTE — Telephone Encounter (Signed)
Sierra Ford, [place]      11/15/15 5:01 PM  Note    PAtient notified of Sleep Study results. Patient decided to set up appointment with [name] to discuss treatment options vs CPAP.  Patient is leary about trying CPAP. Patient scheduled to see Dr. Murlean Iba on 10/26 at 10am.  Patient aware of appointment Nothing further needed.

## 2015-12-13 ENCOUNTER — Other Ambulatory Visit (INDEPENDENT_AMBULATORY_CARE_PROVIDER_SITE_OTHER): Payer: Medicare Other

## 2015-12-13 DIAGNOSIS — E785 Hyperlipidemia, unspecified: Secondary | ICD-10-CM

## 2015-12-13 LAB — LIPID PANEL
Cholesterol: 240 mg/dL — ABNORMAL HIGH (ref 0–200)
HDL: 106.2 mg/dL (ref >39.00)
LDL CALC: 114 mg/dL — AB (ref 0–99)
NONHDL: 133.49
Total CHOL/HDL Ratio: 2
Triglycerides: 97 mg/dL (ref 0.0–149.0)
VLDL: 19.4 mg/dL (ref 0.0–40.0)

## 2015-12-13 LAB — COMPREHENSIVE METABOLIC PANEL
ALK PHOS: 82 U/L (ref 39–117)
ALT: 26 U/L (ref 0–35)
AST: 23 U/L (ref 0–37)
Albumin: 4.5 g/dL (ref 3.5–5.2)
BUN: 18 mg/dL (ref 6–23)
CALCIUM: 10 mg/dL (ref 8.4–10.5)
CO2: 29 meq/L (ref 19–32)
Chloride: 100 mEq/L (ref 96–112)
Creatinine, Ser: 0.87 mg/dL (ref 0.40–1.20)
GFR: 81.8 mL/min (ref >60.00)
GLUCOSE: 118 mg/dL — AB (ref 70–99)
POTASSIUM: 3.9 meq/L (ref 3.5–5.1)
Sodium: 138 mEq/L (ref 135–145)
Total Bilirubin: 1.1 mg/dL (ref 0.2–1.2)
Total Protein: 7.5 g/dL (ref 6.0–8.3)

## 2015-12-13 LAB — HEMOGLOBIN A1C: HEMOGLOBIN A1C: 6.6 % — AB (ref 4.6–6.5)

## 2015-12-15 ENCOUNTER — Ambulatory Visit: Payer: Medicare Other | Admitting: Pulmonary Disease

## 2015-12-16 ENCOUNTER — Ambulatory Visit (INDEPENDENT_AMBULATORY_CARE_PROVIDER_SITE_OTHER): Payer: Medicare Other | Admitting: Pulmonary Disease

## 2015-12-16 ENCOUNTER — Encounter: Payer: Self-pay | Admitting: Pulmonary Disease

## 2015-12-16 VITALS — BP 108/76 | HR 83 | Ht 62.0 in | Wt 183.4 lb

## 2015-12-16 DIAGNOSIS — E669 Obesity, unspecified: Secondary | ICD-10-CM | POA: Diagnosis not present

## 2015-12-16 DIAGNOSIS — G4710 Hypersomnia, unspecified: Secondary | ICD-10-CM | POA: Diagnosis not present

## 2015-12-16 DIAGNOSIS — G4733 Obstructive sleep apnea (adult) (pediatric): Secondary | ICD-10-CM | POA: Diagnosis not present

## 2015-12-16 NOTE — Patient Instructions (Signed)
  It was a pleasure taking care of you today!  You are diagnosed with Obstructive Sleep Apnea or OSA.  You stop breathing  28 times an hour.   We will order you an autoCPAP  machine.  Please call the office if you do NOT receive your machine in the next 1-2 weeks.   Please make sure you use your CPAP device everytime you sleep.  We will monitor the usage of your machine per your insurance requirement.  Your insurance company may take the machine from you if you are not using it regularly.   Please clean the mask, tubings, filter, water reservoir with soapy water every week.  Please use distilled water for the water reservoir.   Please call the office or your machine provider (DME company) if you are having issues with the device.   Return to clinic in 6-8 weeks with Dr. [name] or NP.

## 2015-12-16 NOTE — Assessment & Plan Note (Signed)
Weight reduction 

## 2015-12-16 NOTE — Assessment & Plan Note (Signed)
Patient has snoring, witnessed apneas, gasping, choking. She usually ends up sleeping 4-5 hours during the night. She goes to bed anytime from 11 PM to 1 AM. She wakes up anywhere from 4 to 5 AM. Feels refreshed when she wakes up. She naps daily, lasting 1-2 hours. She naps because it's more out of habit. At the end of a 24-hour period, she ends up sleeping 6-7 hours. She denies abnormal behavior and sleep.  ESS 12  Patient was part of a Alzheimer's disease study over at [facility]. Her mother had Alzheimer's dementia. They were not sure whether sleep apnea was contributing to her memory issues.  Patient had a home sleep test on 11/08/15 which showed her AHI was 28. She wanted to know the different treatment options. We extensively discussed CPAP therapy. She did not necessarily have issues with that. I mentioned to her about oral device/guard but I'm not sure if Medicare will cover it without her trying CPAP first.  Plan :  We extensively discussed the diagnosis, pathophysiology, and treatment options for Obstructive Sleep Apnea (OSA).  We discussed treatment options for OSA including CPAP, BiPaP, as well as surgical options and oral devices.   We will start patient on auto CPAP 5-10 centimeters water. We discussed the different masks. Most likely she once nasal mask or nasal pillows. Discussed with her regarding compliance. She will let me know if she does not feel better or she is having issues with his CPAP. May eventually try oral guard/device on her. We'll try her on a lower pressure to make it more comfortable.  Patient was instructed to call the office if he/she has not received the PAP device in 1-2 weeks.  Patient was instructed to have mask, tubings, filter, reservoir cleaned at least once a week with soapy water.  Patient was instructed to call the office if he/she is having issues with the PAP device.    I advised patient to obtain sufficient amount of sleep --  7 to 8 hours  at least in a 24 hr period.  Patient was advised to follow good sleep hygiene.  Patient was advised NOT to engage in activities requiring concentration and/or vigilance if he/she is and  sleepy.  Patient is NOT to drive if he/she is sleepy.

## 2015-12-16 NOTE — Progress Notes (Signed)
Subjective:    Patient ID: Sierra Ford, female    DOB: Feb 11, 1941, 75 y.o.   MRN: [id number]      This is the case of Sierra Ford, 75 y.o. Female, who was referred by Rhys Martini  in consultation regarding possible OSA.   As you very well know, patient is 40PY smoking history and quit 5 yrs ago, not been dxed with asthma or copd.  She has some sinus issues. She was seen by Dr. Lamonte Sakai in 2015 for dyspnea. Workup was unremarkable. Dyspnea eventually improved with time. Still gets exertional dyspnea with more than ADLs.  Patient has snoring, witnessed apneas, gasping, choking. She usually ends up sleeping 4-5 hours during the night. She goes to bed anytime from 11 PM to 1 AM. She wakes up anywhere from 4 to 5 AM. Feels refreshed when she wakes up. She naps daily, lasting 1-2 hours. She naps because it's more out of habit. At the end of a 24-hour period, she ends up sleeping 6-7 hours. She denies abnormal behavior and sleep.  ESS 12   ROV 12/16/15 Patient returns to the office after home sleep test. She had a home sleep study on 11/08/15 and AHI was 28. She wanted to hold off on CPAP until we discussed today. She remained symptomatic. Has hypersomnia. Hypersomnia affects her functionality. She wanted to know the different treatment options which we discussed today. Has not been admitted nor been on antibiotics since last seen.    Review of Systems  Constitutional: Negative.  Negative for fever and unexpected weight change.  HENT: Positive for congestion and postnasal drip. Negative for dental problem, ear pain, nosebleeds, rhinorrhea, sinus pressure, sneezing, sore throat and trouble swallowing.   Eyes: Negative.  Negative for redness and itching.  Respiratory: Negative.  Negative for cough, chest tightness, shortness of breath and wheezing.   Cardiovascular: Positive for leg swelling. Negative for palpitations.  Gastrointestinal: Negative.  Negative for nausea and vomiting.    Endocrine: Negative.   Genitourinary: Negative.  Negative for dysuria.  Musculoskeletal: Negative.  Negative for joint swelling.  Skin: Negative.  Negative for rash.  Allergic/Immunologic: Negative.   Neurological: Positive for headaches.  Hematological: Negative.  Does not bruise/bleed easily.  Psychiatric/Behavioral: Negative.  Negative for dysphoric mood. The patient is not nervous/anxious.       Objective:   Physical Exam  Vitals:  Vitals:   12/16/15 1207  BP: 108/76  Pulse: 83  SpO2: 95%  Weight: 183 lb 6.4 oz (83.2 kg)  Height: 5\' 2"  (1.575 m)    Constitutional/General:  Pleasant, well-nourished, well-developed, not in any distress,  Comfortably seating.  Well kempt  Body mass index is 33.54 kg/m. Wt Readings from Last 3 Encounters:  12/16/15 183 lb 6.4 oz (83.2 kg)  10/25/15 183 lb (83 kg)  07/21/15 175 lb (79.4 kg)    Neck circumference: 18 inches  HEENT: Pupils equal and reactive to light and accommodation. Anicteric sclerae. Normal nasal mucosa.   No oral  lesions,  mouth clear,  oropharynx clear, no postnasal drip. (-) Oral thrush. No dental caries.  Airway - Mallampati class III-IV  Neck: No masses. Midline trachea. No JVD, (-) LAD. (-) bruits appreciated.  Respiratory/Chest: Grossly normal chest. (-) deformity. (-) Accessory muscle use.  Symmetric expansion. (-) Tenderness on palpation.  Resonant on percussion.  Diminished BS on both lower lung zones. (-) wheezing, crackles, rhonchi (-) egophony  Cardiovascular: Regular rate and  rhythm, heart sounds normal, no murmur or  gallops,Gr 1 edema  Gastrointestinal:  Normal bowel sounds. Soft, non-tender. No hepatosplenomegaly.  (-) masses.   Musculoskeletal:  Normal muscle tone. Normal gait.   Extremities: Grossly normal. (-) clubbing, cyanosis.  (+)  edema  Skin: (-) rash,lesions seen.   Neurological/Psychiatric : alert, oriented to time, place, person. Normal mood and affect           Assessment & Plan:  OSA (obstructive sleep apnea) Patient has snoring, witnessed apneas, gasping, choking. She usually ends up sleeping 4-5 hours during the night. She goes to bed anytime from 11 PM to 1 AM. She wakes up anywhere from 4 to 5 AM. Feels refreshed when she wakes up. She naps daily, lasting 1-2 hours. She naps because it's more out of habit. At the end of a 24-hour period, she ends up sleeping 6-7 hours. She denies abnormal behavior and sleep.  ESS 12  Patient was part of a Alzheimer's disease study over at [facility]. Her mother had Alzheimer's dementia. They were not sure whether sleep apnea was contributing to her memory issues.  Patient had a home sleep test on 11/08/15 which showed her AHI was 28. She wanted to know the different treatment options. We extensively discussed CPAP therapy. She did not necessarily have issues with that. I mentioned to her about oral device/guard but I'm not sure if Medicare will cover it without her trying CPAP first.  Plan :  We extensively discussed the diagnosis, pathophysiology, and treatment options for Obstructive Sleep Apnea (OSA).  We discussed treatment options for OSA including CPAP, BiPaP, as well as surgical options and oral devices.   We will start patient on auto CPAP 5-10 centimeters water. We discussed the different masks. Most likely she once nasal mask or nasal pillows. Discussed with her regarding compliance. She will let me know if she does not feel better or she is having issues with his CPAP. May eventually try oral guard/device on her. We'll try her on a lower pressure to make it more comfortable.  Patient was instructed to call the office if he/she has not received the PAP device in 1-2 weeks.  Patient was instructed to have mask, tubings, filter, reservoir cleaned at least once a week with soapy water.  Patient was instructed to call the office if he/she is having issues with the PAP device.    I advised patient  to obtain sufficient amount of sleep --  7 to 8 hours at least in a 24 hr period.  Patient was advised to follow good sleep hygiene.  Patient was advised NOT to engage in activities requiring concentration and/or vigilance if he/she is and  sleepy.  Patient is NOT to drive if he/she is sleepy.      Obesity (BMI 30-39.9) Weight reduction     Thank you very much for letting me participate in this patient's care. Please do not hesitate to give me a call if you have any questions or concerns regarding the treatment plan.   Patient will follow up with me in 6-8 weeks.     Monica Becton, MD 12/16/2015   12:52 PM Pul[facility] and [place] Pager: [phone] Office: [phone], Fax: [phone]

## 2015-12-19 ENCOUNTER — Telehealth: Payer: Self-pay | Admitting: Family Medicine

## 2015-12-19 NOTE — Telephone Encounter (Signed)
Returning call for lab results.    CB: [phone]

## 2015-12-21 NOTE — Telephone Encounter (Signed)
Spoke with pt. She states that she did not have myalgias on Crestor 10mg , only when they increased it to 40mg . She will continue 10mg  and cardiology will manage her cholesterol. Advised her that hgba1c was about the same as before and to continue watching diet.

## 2015-12-21 NOTE — Telephone Encounter (Signed)
Pt called in, she says that nurse advised her to stop taking her cholesterol medication. She says that she was advised by cardiologist to continue taking it. She says that she also have concerns about her diabetes lab work.   Pt would like a call back.    CB: [phone]

## 2016-01-24 DIAGNOSIS — Z6833 Body mass index (BMI) 33.0-33.9, adult: Secondary | ICD-10-CM | POA: Diagnosis not present

## 2016-01-24 DIAGNOSIS — Z1231 Encounter for screening mammogram for malignant neoplasm of breast: Secondary | ICD-10-CM | POA: Diagnosis not present

## 2016-01-24 DIAGNOSIS — Z01419 Encounter for gynecological examination (general) (routine) without abnormal findings: Secondary | ICD-10-CM | POA: Diagnosis not present

## 2016-01-27 ENCOUNTER — Encounter: Payer: Self-pay | Admitting: Adult Health

## 2016-01-27 ENCOUNTER — Ambulatory Visit (INDEPENDENT_AMBULATORY_CARE_PROVIDER_SITE_OTHER): Payer: Medicare Other | Admitting: Adult Health

## 2016-01-27 VITALS — BP 138/78 | HR 60 | Temp 97.7°F | Ht 62.0 in | Wt 184.8 lb

## 2016-01-27 DIAGNOSIS — G4733 Obstructive sleep apnea (adult) (pediatric): Secondary | ICD-10-CM

## 2016-01-27 NOTE — Patient Instructions (Signed)
Keep up good work  Continue on CPAP At bedtime   Work on weight loss.  Do not drive if sleepy.  Wear for at least 4-6 hr each night  Follow up Dr. [name] in 6 months and As needed

## 2016-01-27 NOTE — Progress Notes (Signed)
Subjective:    Patient ID: Sierra Ford, female    DOB: 02-22-1940, 75 y.o.   MRN: [id number]  HPI 75 yo female followed for Moderate OSA on CPAP   TEST  HST 11/08/15 AHI 28/h   01/27/2016 Follow up : OSA  Pt returns for a 2 month follow up for sleep apnea. She has recently started on CPAP At bedtime   Doing very well, feels rested and much better.  Wears it everynight for ~5hr [facility] shows excellent control with AHI at 1.9, avg usage at 5.5 hr . Min leaks. On autoset 5-15   Past Medical History:  Diagnosis Date  . Chest pain    CLite with apical ischemia in 2006 - normal coronary arteries by [facility] in 12/2004;  Myoview 11/12:  Low risk stress nuclear study with a small, partially reversible apical defect most likely related to apical thinning; cannot R/O very mild apical ischemia.  EF: 75%   . DVT (deep venous thrombosis) ([place]) 2006   hx of  . Ectopic pregnancy with intrauterine pregnancy   . Hiatal hernia   . Hyperlipidemia   . PE (pulmonary embolism) 2006   hx of  . UTI (lower urinary tract infection)    hx   Current Outpatient Prescriptions on File Prior to Visit  Medication Sig Dispense Refill  . aspirin EC 81 MG tablet Take 81 mg by mouth at bedtime.      Marland Kitchen CALCIUM-VITAMIN D PO Take 600 Units by mouth 2 (two) times daily.      . cholecalciferol (VITAMIN D-400) 400 UNITS TABS Take 400 Units by mouth 2 (two) times daily.     . Coenzyme Q10 100 MG TABS Take 100 mg by mouth daily.    Marland Kitchen ezetimibe (ZETIA) 10 MG tablet Take 1 tablet (10 mg total) by mouth daily. 90 tablet 3  . Flaxseed, Linseed, (FLAXSEED OIL PO) Take by mouth.    . furosemide (LASIX) 40 MG tablet Take 1 tablet (40 mg total) by mouth daily. 90 tablet 3  . Multiple Vitamin (MULTIVITAMIN PO) Take 1 tablet by mouth at bedtime.     . Omega-3 Fatty Acids (FISH OIL PO) Take by mouth.    . Potassium (POTASSIMIN PO) Take 1 tablet by mouth as directed.    . rosuvastatin (CRESTOR) 20 MG tablet Take 0.5 tablets (10  mg total) by mouth daily. 45 tablet 0  . [DISCONTINUED] omega-3 acid ethyl esters (LOVAZA) 1 G capsule Take 1 g by mouth 3 (three) times daily.      . [DISCONTINUED] pravastatin (PRAVACHOL) 20 MG tablet Take 20 mg by mouth daily.       No current facility-administered medications on file prior to visit.       Review of Systems Constitutional:   No  weight loss, night sweats,  Fevers, chills, fatigue, or  lassitude.  HEENT:   No headaches,  Difficulty swallowing,  Tooth/dental problems, or  Sore throat,                No sneezing, itching, ear ache, nasal congestion, post nasal drip,   CV:  No chest pain,  Orthopnea, PND, swelling in lower extremities, anasarca, dizziness, palpitations, syncope.   GI  No heartburn, indigestion, abdominal pain, nausea, vomiting, diarrhea, change in bowel habits, loss of appetite, bloody stools.   Resp: No shortness of breath with exertion or at rest.  No excess mucus, no productive cough,  No non-productive cough,  No coughing up of blood.  No change in color of mucus.  No wheezing.  No chest wall deformity  Skin: no rash or lesions.  GU: no dysuria, change in color of urine, no urgency or frequency.  No flank pain, no hematuria   MS:  No joint pain or swelling.  No decreased range of motion.  No back pain.  Psych:  No change in mood or affect. No depression or anxiety.  No memory loss.         Objective:   Physical Exam  Vitals:   01/27/16 1200  BP: 138/78  Pulse: 60  Temp: 97.7 F (36.5 C)  TempSrc: Oral  SpO2: 96%  Weight: 184 lb 12.8 oz (83.8 kg)  Height: 5\' 2"  (1.575 m)  Body mass index is 33.8 kg/m.  GEN: A/Ox3; pleasant , NAD, well nourished    HEENT:  [place]/AT,  EACs-clear, TMs-wnl, NOSE-clear, THROAT-clear, no lesions, no postnasal drip or exudate noted. Class 2-3 MP airway   NECK:  Supple w/ fair ROM; no JVD; normal carotid impulses w/o bruits; no thyromegaly or nodules palpated; no lymphadenopathy.    RESP  Clear  P & A;  w/o, wheezes/ rales/ or rhonchi. no accessory muscle use, no dullness to percussion  CARD:  RRR, no m/r/g  , no peripheral edema, pulses intact, no cyanosis or clubbing.  GI:   Soft & nt; nml bowel sounds; no organomegaly or masses detected.   Musco: Warm bil, no deformities or joint swelling noted.   Neuro: alert, no focal deficits noted.    Skin: Warm, no lesions or rashes  Danaria Larsen NP-C  [facility]  01/27/2016        Assessment & Plan:

## 2016-01-27 NOTE — Assessment & Plan Note (Signed)
Doing well on CPAP w/ good control and compliance   Plan  Patient Instructions  Keep up good work  Continue on CPAP At bedtime   Work on weight loss.  Do not drive if sleepy.  Wear for at least 4-6 hr each night  Follow up Dr. [name] in 6 months and As needed

## 2016-02-02 ENCOUNTER — Encounter: Payer: Self-pay | Admitting: Adult Health

## 2016-02-20 DIAGNOSIS — G4733 Obstructive sleep apnea (adult) (pediatric): Secondary | ICD-10-CM | POA: Diagnosis not present

## 2016-03-16 ENCOUNTER — Ambulatory Visit: Payer: Medicare Other | Admitting: Family Medicine

## 2016-03-16 ENCOUNTER — Telehealth: Payer: Self-pay | Admitting: Family Medicine

## 2016-03-16 NOTE — Telephone Encounter (Signed)
No charge. 

## 2016-03-16 NOTE — Telephone Encounter (Signed)
Pt says that she is stuck in traffic and will be more that atleast 30 minutes late for her appt. Advised pt that she will have to reschedule per CMA.    Pt says that she would like to not be charged?

## 2016-03-22 DIAGNOSIS — G4733 Obstructive sleep apnea (adult) (pediatric): Secondary | ICD-10-CM | POA: Diagnosis not present

## 2016-04-18 DIAGNOSIS — G4733 Obstructive sleep apnea (adult) (pediatric): Secondary | ICD-10-CM | POA: Diagnosis not present

## 2016-04-19 DIAGNOSIS — G4733 Obstructive sleep apnea (adult) (pediatric): Secondary | ICD-10-CM | POA: Diagnosis not present

## 2016-04-24 DIAGNOSIS — G4733 Obstructive sleep apnea (adult) (pediatric): Secondary | ICD-10-CM | POA: Diagnosis not present

## 2016-05-15 ENCOUNTER — Other Ambulatory Visit: Payer: Medicare PPO

## 2016-05-15 DIAGNOSIS — E785 Hyperlipidemia, unspecified: Secondary | ICD-10-CM | POA: Diagnosis not present

## 2016-05-15 LAB — LIPID PANEL
Chol/HDL Ratio: 1.9 ratio units (ref 0.0–4.4)
Cholesterol, Total: 217 mg/dL — ABNORMAL HIGH (ref 100–199)
HDL: 115 mg/dL (ref >39)
LDL CALC: 87 mg/dL (ref 0–99)
Triglycerides: 76 mg/dL (ref 0–149)
VLDL CHOLESTEROL CAL: 15 mg/dL (ref 5–40)

## 2016-05-15 LAB — HEPATIC FUNCTION PANEL
ALBUMIN: 4.4 g/dL (ref 3.5–4.8)
ALT: 22 IU/L (ref 0–32)
AST: 24 IU/L (ref 0–40)
Alkaline Phosphatase: 92 IU/L (ref 39–117)
Bilirubin Total: 1 mg/dL (ref 0.0–1.2)
Bilirubin, Direct: 0.24 mg/dL (ref 0.00–0.40)
TOTAL PROTEIN: 6.8 g/dL (ref 6.0–8.5)

## 2016-05-16 ENCOUNTER — Ambulatory Visit: Payer: Medicare Other

## 2016-05-16 ENCOUNTER — Ambulatory Visit (INDEPENDENT_AMBULATORY_CARE_PROVIDER_SITE_OTHER): Payer: Medicare PPO | Admitting: Pharmacist

## 2016-05-16 ENCOUNTER — Encounter: Payer: Self-pay | Admitting: Pharmacist

## 2016-05-16 DIAGNOSIS — E785 Hyperlipidemia, unspecified: Secondary | ICD-10-CM | POA: Diagnosis not present

## 2016-05-16 NOTE — Progress Notes (Signed)
Patient ID: Sierra Ford                 DOB: August 31, 1940                    MRN: [id number]     HPI: Sierra Ford is a 76 y.o. female patient of Dr. Percival Spanish that presents today for lipid follow up. She is a longstanding patient of ours, with a history of statin intolerance with atorvastatin, pravastatin, rosuvastatin 20mg  plus Lovaza, but did not tolerate these due to myalgias. Currently taking Crestor 10mg  daily and Zetia 10mg  daily and tolerating them well. Pt prefers to follow up every 6 months with lipid panel and office visit; this helps with accountability for lifestyle improvements. At last Lipid Clinic visit 6 months ago, rosuvastatin 10 mg and ezetimibe 10 mg were continued as LDL <70.  Patient knows that she has work to do with her diet, and that she has been less controlled with her portion sizes since her previous LDL of 59. She says that she eats ice cream at least 4 times a week, and that it is usually the [place] [employer] sized containers. She notes that she eats well when she cooks for herself, but has been eating out a lot more lately. She has had no tolerance issues with her rosuvastatin and ezetimibe.   Current Medications: rosuvastatin 10mg  daily - max tolerated dose, ezetimibe 10mg  daily Intolerances: rosuvastatin 20mg  - muscle aches, atorvastatin and pravastatin - muscle aches, Lovaza - muscle aches Risk Factors: Carotid Artery Disease- mild-moderate carotid plaque on ultrasound LDL goal: 100mg /dL - conservative goal. Will aim for <70 due to carotid plaque.  Diet: Has been trying to eat more chicken - does fry it occasionally. Eats a lot of spinach, kale and collard greens. Tries to limit her red meat. Does eat Suezanne Jacquet and Jerry's frequently, at least 4 days a week. Avoids cooking carbohydrates, but will eat rice/pasta/potatoes when she goes out to eat.  Exercise: Water aerobics classes every morning, and Silver Sneakers for 45 minutes.   Family History: Mother had  heart failure and Alzheimer's disease.  Social History: Smoked cigarettes for 50 years - quit 3 years ago. Minimal alcohol use, no drug use.  Labs: 05/15/16: TC 217, TG 76, HDL 115, LDL 87 (Crestor 10mg  daily, Zetia 10mg  daily) 10/2015: TC 194, TG 97, HDL 106, LDL 69 (Crestor 10mg  daily, Zetia 10mg  daily) 04/2015: TC 232, TG 70, HDL 127, LDL 91, LFTs wnl (Crestor 10mg  daily) 10/2014: TC 262, TG 93, HDL 105.4, LDL 138, LFTs wnl (Crestor 10mg  daily) - admits to many dietary indiscretions this summer 05/2014: TC 184, TG 75, HDL 82.7, LDL 86, LFTs wnl (Crestor 10mg  daily)  Past Medical History:  Diagnosis Date  . Chest pain    CLite with apical ischemia in 2006 - normal coronary arteries by [facility] in 12/2004;  Myoview 11/12:  Low risk stress nuclear study with a small, partially reversible apical defect most likely related to apical thinning; cannot R/O very mild apical ischemia.  EF: 75%   . DVT (deep venous thrombosis) ([place]) 2006   hx of  . Ectopic pregnancy with intrauterine pregnancy   . Hiatal hernia   . Hyperlipidemia   . PE (pulmonary embolism) 2006   hx of  . UTI (lower urinary tract infection)    hx    Current Outpatient Prescriptions on File Prior to Visit  Medication Sig Dispense Refill  . aspirin EC 81 MG  tablet Take 81 mg by mouth at bedtime.      Marland Kitchen CALCIUM-VITAMIN D PO Take 600 Units by mouth 2 (two) times daily.      . cholecalciferol (VITAMIN D-400) 400 UNITS TABS Take 400 Units by mouth 2 (two) times daily.     . Coenzyme Q10 100 MG TABS Take 100 mg by mouth daily.    Marland Kitchen ezetimibe (ZETIA) 10 MG tablet Take 1 tablet (10 mg total) by mouth daily. 90 tablet 3  . Flaxseed, Linseed, (FLAXSEED OIL PO) Take by mouth.    . furosemide (LASIX) 40 MG tablet Take 1 tablet (40 mg total) by mouth daily. 90 tablet 3  . Multiple Vitamin (MULTIVITAMIN PO) Take 1 tablet by mouth at bedtime.     . Omega-3 Fatty Acids (FISH OIL PO) Take by mouth.    . Potassium (POTASSIMIN PO) Take 1 tablet by  mouth as directed.    . rosuvastatin (CRESTOR) 20 MG tablet Take 0.5 tablets (10 mg total) by mouth daily. 45 tablet 0  . [DISCONTINUED] omega-3 acid ethyl esters (LOVAZA) 1 G capsule Take 1 g by mouth 3 (three) times daily.      . [DISCONTINUED] pravastatin (PRAVACHOL) 20 MG tablet Take 20 mg by mouth daily.       No current facility-administered medications on file prior to visit.     No Known Allergies  Assessment/Plan:  Hyperlipidemia: LDL at conservative goal of <100, but is still greater than ideal goal of <70, given her history of carotid artery disease.  Continue rosuvastatin 10 mg and ezetimibe 10 mg daily, as this is her maximum tolerated statin dose. We discussed dietary changes she could target, specifically reducing portion size and frequency of desserts during the week. Overall, she knows what she needs to do to reduce her cholesterol, and states that she will be making these changes to control her cholesterol and lose weight.   Follow up in 6 months for hepatic/lipid panel and discussion regarding success of lifestyle modifications.    Patient was seen with Courtney Heys, PharmD Candidate 2018.  Thank you,  Lelan Pons. Patterson Hammersmith, [facility]  05/16/2016 7:44 AM

## 2016-05-16 NOTE — Patient Instructions (Addendum)
Try to work on serving sizes - Press photographer the [place] and [employer] sized ice creams, and serve yourself an appropriate serving!   Cholesterol Cholesterol is a white, waxy, fat-like substance that is needed by the human body in small amounts. The liver makes all the cholesterol we need. Cholesterol is carried from the liver by the blood through the blood vessels. Deposits of cholesterol (plaques) may build up on blood vessel (artery) walls. Plaques make the arteries narrower and stiffer. Cholesterol plaques increase the risk for heart attack and stroke. You cannot feel your cholesterol level even if it is very high. The only way to know that it is high is to have a blood test. Once you know your cholesterol levels, you should keep a record of the test results. Work with your health care provider to keep your levels in the desired range. What do the results mean?  Total cholesterol is a rough measure of all the cholesterol in your blood.  LDL (low-density lipoprotein) is the "bad" cholesterol. This is the type that causes plaque to build up on the artery walls. You want this level to be low.  HDL (high-density lipoprotein) is the "good" cholesterol because it cleans the arteries and carries the LDL away. You want this level to be high.  Triglycerides are fat that the body can either burn for energy or store. High levels are closely linked to heart disease. What are the desired levels of cholesterol?  Total cholesterol below 200.  LDL below 100 for people who are at risk, below 70 for people at very high risk.  HDL above 40 is good. A level of 60 or higher is considered to be protective against heart disease.  Triglycerides below 150. How can I lower my cholesterol? Diet  Follow your diet program as told by your health care provider.  Choose fish or white meat chicken and [place], roasted or baked. Limit fatty cuts of red meat, fried foods, and processed meats, such as sausage and lunch  meats.  Eat lots of fresh fruits and vegetables.  Choose whole grains, beans, pasta, potatoes, and cereals.  Choose olive oil, corn oil, or canola oil, and use only small amounts.  Avoid butter, mayonnaise, shortening, or palm kernel oils.  Avoid foods with trans fats.  Drink skim or nonfat milk and eat low-fat or nonfat yogurt and cheeses. Avoid whole milk, cream, ice cream, egg yolks, and full-fat cheeses.  Healthier desserts include angel food cake, ginger snaps, animal crackers, hard candy, popsicles, and low-fat or nonfat frozen yogurt. Avoid pastries, cakes, pies, and cookies. Exercise  Follow your exercise program as told by your health care provider. A regular program:  Helps to decrease LDL and raise HDL.  Helps with weight control.  Do things that increase your activity level, such as gardening, walking, and taking the stairs.  Ask your health care provider about ways that you can be more active in your daily life. Medicine  Take over-the-counter and prescription medicines only as told by your health care provider.  Medicine may be prescribed by your health care provider to help lower cholesterol and decrease the risk for heart disease. This is usually done if diet and exercise have failed to bring down cholesterol levels.  If you have several risk factors, you may need medicine even if your levels are normal. This information is not intended to replace advice given to you by your health care provider. Make sure you discuss any questions you have with your  health care provider. Document Released: 10/31/2000 Document Revised: 09/03/2015 Document Reviewed: 08/06/2015 Elsevier Interactive Patient Education  2017 [employer].

## 2016-05-20 DIAGNOSIS — G4733 Obstructive sleep apnea (adult) (pediatric): Secondary | ICD-10-CM | POA: Diagnosis not present

## 2016-05-21 ENCOUNTER — Other Ambulatory Visit: Payer: Self-pay | Admitting: Cardiology

## 2016-05-22 ENCOUNTER — Telehealth: Payer: Self-pay | Admitting: Cardiology

## 2016-05-22 NOTE — Telephone Encounter (Signed)
New message   Pt is calling returning call to [facility] about labs.

## 2016-05-22 NOTE — Telephone Encounter (Signed)
Rx request sent to pharmacy.  

## 2016-05-22 NOTE — Telephone Encounter (Signed)
Patient notified of lab results

## 2016-05-29 ENCOUNTER — Other Ambulatory Visit: Payer: Self-pay | Admitting: Cardiology

## 2016-05-29 NOTE — Telephone Encounter (Signed)
REFILL 

## 2016-05-31 DIAGNOSIS — E785 Hyperlipidemia, unspecified: Secondary | ICD-10-CM | POA: Diagnosis not present

## 2016-05-31 DIAGNOSIS — M170 Bilateral primary osteoarthritis of knee: Secondary | ICD-10-CM | POA: Diagnosis not present

## 2016-05-31 DIAGNOSIS — H9192 Unspecified hearing loss, left ear: Secondary | ICD-10-CM | POA: Diagnosis not present

## 2016-05-31 DIAGNOSIS — E1151 Type 2 diabetes mellitus with diabetic peripheral angiopathy without gangrene: Secondary | ICD-10-CM | POA: Diagnosis not present

## 2016-05-31 DIAGNOSIS — Z6830 Body mass index (BMI) 30.0-30.9, adult: Secondary | ICD-10-CM | POA: Diagnosis not present

## 2016-05-31 DIAGNOSIS — H269 Unspecified cataract: Secondary | ICD-10-CM | POA: Diagnosis not present

## 2016-05-31 DIAGNOSIS — E669 Obesity, unspecified: Secondary | ICD-10-CM | POA: Diagnosis not present

## 2016-06-19 DIAGNOSIS — G4733 Obstructive sleep apnea (adult) (pediatric): Secondary | ICD-10-CM | POA: Diagnosis not present

## 2016-06-28 ENCOUNTER — Encounter: Payer: Self-pay | Admitting: Family Medicine

## 2016-06-28 ENCOUNTER — Ambulatory Visit (INDEPENDENT_AMBULATORY_CARE_PROVIDER_SITE_OTHER): Payer: Medicare PPO | Admitting: Family Medicine

## 2016-06-28 VITALS — BP 110/66 | HR 61 | Temp 98.0°F | Resp 16 | Ht 62.0 in | Wt 185.6 lb

## 2016-06-28 DIAGNOSIS — H6123 Impacted cerumen, bilateral: Secondary | ICD-10-CM | POA: Diagnosis not present

## 2016-06-28 DIAGNOSIS — I5032 Chronic diastolic (congestive) heart failure: Secondary | ICD-10-CM | POA: Diagnosis not present

## 2016-06-28 DIAGNOSIS — R739 Hyperglycemia, unspecified: Secondary | ICD-10-CM

## 2016-06-28 DIAGNOSIS — E785 Hyperlipidemia, unspecified: Secondary | ICD-10-CM | POA: Diagnosis not present

## 2016-06-28 LAB — COMPREHENSIVE METABOLIC PANEL
ALK PHOS: 74 U/L (ref 39–117)
ALT: 22 U/L (ref 0–35)
AST: 23 U/L (ref 0–37)
Albumin: 4.4 g/dL (ref 3.5–5.2)
BILIRUBIN TOTAL: 1.2 mg/dL (ref 0.2–1.2)
BUN: 22 mg/dL (ref 6–23)
CALCIUM: 9.8 mg/dL (ref 8.4–10.5)
CO2: 28 mEq/L (ref 19–32)
CREATININE: 0.89 mg/dL (ref 0.40–1.20)
Chloride: 102 mEq/L (ref 96–112)
GFR: 79.57 mL/min (ref >60.00)
Glucose, Bld: 122 mg/dL — ABNORMAL HIGH (ref 70–99)
Potassium: 4.2 mEq/L (ref 3.5–5.1)
SODIUM: 137 meq/L (ref 135–145)
TOTAL PROTEIN: 7.1 g/dL (ref 6.0–8.3)

## 2016-06-28 LAB — HEMOGLOBIN A1C: Hgb A1c MFr Bld: 6.8 % — ABNORMAL HIGH (ref 4.6–6.5)

## 2016-06-28 NOTE — Patient Instructions (Signed)
Carbohydrate Counting for Diabetes Mellitus, Adult Carbohydrate counting is a method for keeping track of how many carbohydrates you eat. Eating carbohydrates naturally increases the amount of sugar (glucose) in the blood. Counting how many carbohydrates you eat helps keep your blood glucose within normal limits, which helps you manage your diabetes (diabetes mellitus). It is important to know how many carbohydrates you can safely have in each meal. This is different for every person. A diet and nutrition specialist (registered dietitian) can help you make a meal plan and calculate how many carbohydrates you should have at each meal and snack. Carbohydrates are found in the following foods:  Grains, such as breads and cereals.  Dried beans and soy products.  Starchy vegetables, such as potatoes, peas, and corn.  Fruit and fruit juices.  Milk and yogurt.  Sweets and snack foods, such as cake, cookies, candy, chips, and soft drinks. How do I count carbohydrates? There are two ways to count carbohydrates in food. You can use either of the methods or a combination of both. Reading "Nutrition Facts" on packaged food  The "Nutrition Facts" list is included on the labels of almost all packaged foods and beverages in the [place] It includes:  The serving size.  Information about nutrients in each serving, including the grams (g) of carbohydrate per serving. To use the "Nutrition Facts":  Decide how many servings you will have.  Multiply the number of servings by the number of carbohydrates per serving.  The resulting number is the total amount of carbohydrates that you will be having. Learning standard serving sizes of other foods  When you eat foods containing carbohydrates that are not packaged or do not include "Nutrition Facts" on the label, you need to measure the servings in order to count the amount of carbohydrates:  Measure the foods that you will eat with a food scale or measuring  cup, if needed.  Decide how many standard-size servings you will eat.  Multiply the number of servings by 15. Most carbohydrate-rich foods have about 15 g of carbohydrates per serving.  For example, if you eat 8 oz (170 g) of strawberries, you will have eaten 2 servings and 30 g of carbohydrates (2 servings x 15 g = 30 g).  For foods that have more than one food mixed, such as soups and casseroles, you must count the carbohydrates in each food that is included. The following list contains standard serving sizes of common carbohydrate-rich foods. Each of these servings has about 15 g of carbohydrates:   hamburger bun or  English muffin.   oz (15 mL) syrup.   oz (14 g) jelly.  1 slice of bread.  1 six-inch tortilla.  3 oz (85 g) cooked rice or pasta.  4 oz (113 g) cooked dried beans.  4 oz (113 g) starchy vegetable, such as peas, corn, or potatoes.  4 oz (113 g) hot cereal.  4 oz (113 g) mashed potatoes or  of a large baked potato.  4 oz (113 g) canned or frozen fruit.  4 oz (120 mL) fruit juice.  4-6 crackers.  6 chicken nuggets.  6 oz (170 g) unsweetened dry cereal.  6 oz (170 g) plain fat-free yogurt or yogurt sweetened with artificial sweeteners.  8 oz (240 mL) milk.  8 oz (170 g) fresh fruit or one small piece of fruit.  24 oz (680 g) popped popcorn. Example of carbohydrate counting Sample meal  3 oz (85 g) chicken breast.  6 oz (  170 g) brown rice.  4 oz (113 g) corn.  8 oz (240 mL) milk.  8 oz (170 g) strawberries with sugar-free whipped topping. Carbohydrate calculation 1. Identify the foods that contain carbohydrates:  Rice.  Corn.  Milk.  Strawberries. 2. Calculate how many servings you have of each food:  2 servings rice.  1 serving corn.  1 serving milk.  1 serving strawberries. 3. Multiply each number of servings by 15 g:  2 servings rice x 15 g = 30 g.  1 serving corn x 15 g = 15 g.  1 serving milk x 15 g = 15  g.  1 serving strawberries x 15 g = 15 g. 4. Add together all of the amounts to find the total grams of carbohydrates eaten:  30 g + 15 g + 15 g + 15 g = 75 g of carbohydrates total. This information is not intended to replace advice given to you by your health care provider. Make sure you discuss any questions you have with your health care provider. Document Released: 02/05/2005 Document Revised: 08/26/2015 Document Reviewed: 07/20/2015 Elsevier Interactive Patient Education  2017 Elsevier Inc.  

## 2016-06-28 NOTE — Assessment & Plan Note (Signed)
Per cardiology con't zetia and crestor

## 2016-06-28 NOTE — Assessment & Plan Note (Signed)
Per cardiology 

## 2016-06-28 NOTE — Progress Notes (Signed)
Patient ID: Sierra Ford, female   DOB: Feb 04, 1941, 76 y.o.   MRN: [id number]     Subjective:  I acted as a Education administrator for Dr. Carollee Herter.  [name], Leslie   Patient ID: Sierra Ford, female    DOB: [birth year], 76 y.o.   MRN: [id number]  Chief Complaint  Patient presents with  . Follow-up    HPI  Patient is in today for follow up of cholesterol , sugar and is c/o her ears feeling full.  No other complaints.   Patient Care Team: Carollee Herter, Alferd Apa, DO as PCP - General Maisie Fus, MD as Consulting Physician (Obstetrics and Gynecology) Minus Breeding, MD as Consulting Physician (Cardiology) Collene Gobble, MD as Consulting Physician (Pulmonary Disease)   Past Medical History:  Diagnosis Date  . Chest pain    CLite with apical ischemia in 2006 - normal coronary arteries by [facility] in 12/2004;  Myoview 11/12:  Low risk stress nuclear study with a small, partially reversible apical defect most likely related to apical thinning; cannot R/O very mild apical ischemia.  EF: 75%   . DVT (deep venous thrombosis) ([place]) 2006   hx of  . Ectopic pregnancy with intrauterine pregnancy   . Hiatal hernia   . Hyperlipidemia   . PE (pulmonary embolism) 2006   hx of  . UTI (lower urinary tract infection)    hx    Past Surgical History:  Procedure Laterality Date  . ABDOMINAL EXPLORATION SURGERY    . BUNIONECTOMY     right  . ECTOPIC PREGNANCY SURGERY    . FOOT SURGERY      Family History  Problem Relation Age of Onset  . Heart failure Mother        died from  . Colon polyps Mother   . Diabetes Maternal Museum/gallery exhibitions officer  . Diabetes Maternal Aunt     Social History   Social History  . Marital status: Divorced    Spouse name: N/A  . Number of children: N/A  . Years of education: N/A   Occupational History  . [employer]    Social History Main Topics  . Smoking status: Former Smoker    Packs/day: 1.00    Years: 50.00    Types: Cigarettes   Quit date: 11/21/2009  . Smokeless tobacco: Never Used  . Alcohol use No  . Drug use: No  . Sexual activity: Not Currently    Partners: Male   Other Topics Concern  . Not on file   Social History Narrative   Regular exercise          Outpatient Medications Prior to Visit  Medication Sig Dispense Refill  . aspirin EC 81 MG tablet Take 81 mg by mouth at bedtime.      Marland Kitchen CALCIUM-VITAMIN D PO Take 600 Units by mouth 2 (two) times daily.      . cholecalciferol (VITAMIN D-400) 400 UNITS TABS Take 400 Units by mouth 2 (two) times daily.     . Coenzyme Q10 100 MG TABS Take 100 mg by mouth daily.    Marland Kitchen ezetimibe (ZETIA) 10 MG tablet TAKE ONE TABLET BY MOUTH ONCE DAILY 90 tablet 2  . Flaxseed, Linseed, (FLAXSEED OIL PO) Take by mouth.    . furosemide (LASIX) 40 MG tablet Take 1 tablet (40 mg total) by mouth daily. 90 tablet 3  . Multiple Vitamin (MULTIVITAMIN PO) Take 1 tablet by mouth at  bedtime.     . Omega-3 Fatty Acids (FISH OIL PO) Take by mouth.    . Potassium (POTASSIMIN PO) Take 1 tablet by mouth as directed.    . rosuvastatin (CRESTOR) 20 MG tablet Take 0.5 tablets (10 mg total) by mouth daily. 45 tablet 0  . rosuvastatin (CRESTOR) 20 MG tablet TAKE ONE-HALF TO ONE TABLET BY MOUTH ONCE DAILY OR  AS  DIRECTED 90 tablet 1   No facility-administered medications prior to visit.     No Known Allergies  Review of Systems  Constitutional: Negative for fever and malaise/fatigue.  HENT: Negative for congestion.   Eyes: Negative for blurred vision.  Respiratory: Negative for cough and shortness of breath.   Cardiovascular: Negative for chest pain, palpitations and leg swelling.  Gastrointestinal: Negative for vomiting.  Musculoskeletal: Negative for back pain.  Skin: Negative for rash.  Neurological: Negative for loss of consciousness and headaches.       Objective:    Physical Exam  Constitutional: She is oriented to person, place, and time. She appears well-developed and  well-nourished.  HENT:  Head: Normocephalic and atraumatic.  Ears:  Eyes: Conjunctivae and EOM are normal.  Neck: Normal range of motion. Neck supple. No JVD present. Carotid bruit is not present. No thyromegaly present.  Cardiovascular: Normal rate, regular rhythm and normal heart sounds.   No murmur heard. Pulmonary/Chest: Effort normal and breath sounds normal. No respiratory distress. She has no wheezes. She has no rales. She exhibits no tenderness.  Musculoskeletal: She exhibits no edema.  Neurological: She is alert and oriented to person, place, and time.  Psychiatric: She has a normal mood and affect. Her behavior is normal. Judgment and thought content normal.  Nursing note and vitals reviewed.   BP 110/66 (BP Location: Left Arm, Cuff Size: Normal)   Pulse 61   Temp 98 F (36.7 C) (Oral)   Resp 16   Ht 5\' 2"  (1.575 m)   Wt 185 lb 9.6 oz (84.2 kg)   SpO2 96%   BMI 33.95 kg/m  Wt Readings from Last 3 Encounters:  06/28/16 185 lb 9.6 oz (84.2 kg)  01/27/16 184 lb 12.8 oz (83.8 kg)  12/16/15 183 lb 6.4 oz (83.2 kg)   BP Readings from Last 3 Encounters:  06/28/16 110/66  01/27/16 138/78  12/16/15 108/76     Immunization History  Administered Date(s) Administered  . Pneumococcal Conjugate-13 04/24/2013  . Pneumococcal Polysaccharide-23 08/07/2007  . Td 03/03/2003  . Zoster 08/14/2007, 11/13/2007    Health Maintenance  Topic Date Due  . TETANUS/TDAP  07/14/2016 (Originally 03/02/2013)  . INFLUENZA VACCINE  09/19/2016  . MAMMOGRAM  11/19/2016  . COLONOSCOPY  10/22/2017  . DEXA SCAN  Completed  . PNA vac Low Risk Adult  Completed    Lab Results  Component Value Date   WBC 6.4 10/04/2015   HGB 14.5 10/04/2015   HCT 42.3 10/04/2015   PLT 272.0 10/04/2015   GLUCOSE 122 (H) 06/28/2016   CHOL 217 (H) 05/15/2016   TRIG 76 05/15/2016   HDL 115 05/15/2016   LDLDIRECT 95.9 10/06/2012   LDLCALC 87 05/15/2016   ALT 22 06/28/2016   AST 23 06/28/2016   NA 137  06/28/2016   K 4.2 06/28/2016   CL 102 06/28/2016   CREATININE 0.89 06/28/2016   BUN 22 06/28/2016   CO2 28 06/28/2016   TSH 1.00 01/03/2015   INR 1.03 04/20/2009   HGBA1C 6.8 (H) 06/28/2016   MICROALBUR <0.7 05/20/2014  Lab Results  Component Value Date   TSH 1.00 01/03/2015   Lab Results  Component Value Date   WBC 6.4 10/04/2015   HGB 14.5 10/04/2015   HCT 42.3 10/04/2015   MCV 91.8 10/04/2015   PLT 272.0 10/04/2015   Lab Results  Component Value Date   NA 137 06/28/2016   K 4.2 06/28/2016   CO2 28 06/28/2016   GLUCOSE 122 (H) 06/28/2016   BUN 22 06/28/2016   CREATININE 0.89 06/28/2016   BILITOT 1.2 06/28/2016   ALKPHOS 74 06/28/2016   AST 23 06/28/2016   ALT 22 06/28/2016   PROT 7.1 06/28/2016   ALBUMIN 4.4 06/28/2016   CALCIUM 9.8 06/28/2016   GFR 79.57 06/28/2016   Lab Results  Component Value Date   CHOL 217 (H) 05/15/2016   Lab Results  Component Value Date   HDL 115 05/15/2016   Lab Results  Component Value Date   LDLCALC 87 05/15/2016   Lab Results  Component Value Date   TRIG 76 05/15/2016   Lab Results  Component Value Date   CHOLHDL 1.9 05/15/2016   Lab Results  Component Value Date   HGBA1C 6.8 (H) 06/28/2016         Assessment & Plan:   Problem List Items Addressed This Visit      Unprioritized   Chronic diastolic heart failure [facility])    Per cardiology      Hyperlipidemia    Per cardiology con't zetia and crestor       Other Visit Diagnoses    Hyperglycemia    -  Primary   Relevant Orders   Hemoglobin A1c (Completed)   Comprehensive metabolic panel (Completed)   Bilateral impacted cerumen       Relevant Orders   Ambulatory referral to ENT    +  I am having Ms. Conradt maintain her cholecalciferol, CALCIUM-VITAMIN D PO, Multiple Vitamin (MULTIVITAMIN PO), aspirin EC, rosuvastatin, furosemide, Potassium (POTASSIMIN PO), Coenzyme Q10, (Flaxseed, Linseed, (FLAXSEED OIL PO)), Omega-3 Fatty Acids (FISH OIL PO),  and ezetimibe.  No orders of the defined types were placed in this encounter.   CMA served as Education administrator during this visit. History, Physical and Plan performed by medical provider. Documentation and orders reviewed and attested to.  Ann Held, DO

## 2016-06-29 ENCOUNTER — Other Ambulatory Visit: Payer: Self-pay | Admitting: Family Medicine

## 2016-06-29 DIAGNOSIS — E119 Type 2 diabetes mellitus without complications: Secondary | ICD-10-CM

## 2016-06-29 DIAGNOSIS — E785 Hyperlipidemia, unspecified: Secondary | ICD-10-CM

## 2016-07-12 ENCOUNTER — Telehealth: Payer: Self-pay | Admitting: Family Medicine

## 2016-07-12 NOTE — Telephone Encounter (Signed)
Relation to EW:YBRK Call back [phone]   Reason for call:  Patient scheduled AWV with PCP for 10/09/2016 due to patient wanting EKG at the time, please advise

## 2016-07-12 NOTE — Telephone Encounter (Signed)
Called left message to call back 

## 2016-07-12 NOTE — Telephone Encounter (Signed)
If she is having a problem , ie chest pain --  We cant do both She could still do medicare wellness with nurse and see me either before or after

## 2016-07-13 NOTE — Telephone Encounter (Signed)
Called left message to call back 

## 2016-07-13 NOTE — Telephone Encounter (Signed)
Patient returned call and the EKG is for [facility] purposes only.

## 2016-07-19 NOTE — Progress Notes (Signed)
HPI The patient presents for evaluation of dyspnea.  She has had some mild diastolic dysfunction and abnormal echo.  I saw her last year and she returns for follow up.  She has been diagnosed with sleep apnea and her A1C was elevated.  She still exercises daily with water aerobics, walking and Silver sneakers.   She gets a little bit dizzy if she tries to use machines exercise but she otherwise does well.  The patient denies any new symptoms such as chest discomfort, neck or arm discomfort. There has been no new shortness of breath, PND or orthopnea. There have been no reported palpitations, presyncope or syncope.  She has very mild ankle edema  No Known Allergies  Current Outpatient Prescriptions  Medication Sig Dispense Refill  . aspirin EC 81 MG tablet Take 81 mg by mouth at bedtime.      Marland Kitchen CALCIUM-VITAMIN D PO Take 600 Units by mouth 2 (two) times daily.      . cholecalciferol (VITAMIN D-400) 400 UNITS TABS Take 400 Units by mouth 2 (two) times daily.     . Coenzyme Q10 100 MG TABS Take 100 mg by mouth daily.    Marland Kitchen ezetimibe (ZETIA) 10 MG tablet TAKE ONE TABLET BY MOUTH ONCE DAILY 90 tablet 2  . Flaxseed, Linseed, (FLAXSEED OIL PO) Take by mouth.    . furosemide (LASIX) 40 MG tablet Take 1 tablet (40 mg total) by mouth daily. 90 tablet 3  . Multiple Vitamin (MULTIVITAMIN PO) Take 1 tablet by mouth at bedtime.     . Omega-3 Fatty Acids (FISH OIL PO) Take by mouth.    . Potassium (POTASSIMIN PO) Take 1 tablet by mouth as directed.    . rosuvastatin (CRESTOR) 20 MG tablet Take 0.5 tablets (10 mg total) by mouth daily. 45 tablet 0   No current facility-administered medications for this visit.     Past Medical History:  Diagnosis Date  . Chest pain    CLite with apical ischemia in 2006 - normal coronary arteries by [facility] in 12/2004;  Myoview 11/12:  Low risk stress nuclear study with a small, partially reversible apical defect most likely related to apical thinning; cannot R/O very mild  apical ischemia.  EF: 75%   . DVT (deep venous thrombosis) ([place]) 2006   hx of  . Ectopic pregnancy with intrauterine pregnancy   . Hiatal hernia   . Hyperlipidemia   . PE (pulmonary embolism) 2006   hx of  . UTI (lower urinary tract infection)    hx    Past Surgical History:  Procedure Laterality Date  . ABDOMINAL EXPLORATION SURGERY    . BUNIONECTOMY     right  . ECTOPIC PREGNANCY SURGERY    . FOOT SURGERY      ROS: Positive for plantar fasciitis.  Otherwise as stated in the HPI and negative for all other systems.  PHYSICAL EXAM BP 106/70   Pulse 92   Ht 5\' 2"  (1.575 m)   Wt 183 lb (83 kg)   BMI 33.47 kg/m   GENERAL:  Well appearing NECK:  No jugular venous distention, waveform within normal limits, carotid upstroke brisk and symmetric, no bruits, no thyromegaly LUNGS:  Clear to auscultation bilaterally CHEST:  Unremarkable HEART:  PMI not displaced or sustained,S1 and S2 within normal limits, no S3, no S4, no clicks, no rubs, no murmurs ABD:  Flat, positive bowel sounds normal in frequency in pitch, no bruits, no rebound, no guarding, no midline pulsatile mass,  no hepatomegaly, no splenomegaly EXT:  2 plus pulses throughout, trace edema, no cyanosis no clubbing   EKG:  Sinus rhythm, rate 92, left axis deviation, poor anterior R wave progression, minimal voltage criteria for left ventricular hypertrophy, no acute ST-T wave changes.  07/20/2016   Lab Results  Component Value Date   HGBA1C 6.8 (H) 06/28/2016    ASSESSMENT AND PLAN  DIASTOLIC HF:  The patient had mild evidence of this on echo.   At this point I don't think that further study is needed.  She understand salt and fluid restrictuion.   EDEMA:  This has been mild.  No change in therapy is planned.   HTN:  The blood pressure is at target.  She will remain on med as listed.  DM:   This is new and she is followed at a study at [facility].   SLEEP APNEA:  She is having this treated with CPAP.

## 2016-07-20 ENCOUNTER — Ambulatory Visit (INDEPENDENT_AMBULATORY_CARE_PROVIDER_SITE_OTHER): Payer: Medicare PPO | Admitting: Cardiology

## 2016-07-20 ENCOUNTER — Encounter: Payer: Self-pay | Admitting: Cardiology

## 2016-07-20 VITALS — BP 106/70 | HR 92 | Ht 62.0 in | Wt 183.0 lb

## 2016-07-20 DIAGNOSIS — I5032 Chronic diastolic (congestive) heart failure: Secondary | ICD-10-CM

## 2016-07-20 DIAGNOSIS — M7989 Other specified soft tissue disorders: Secondary | ICD-10-CM | POA: Diagnosis not present

## 2016-07-20 DIAGNOSIS — I10 Essential (primary) hypertension: Secondary | ICD-10-CM | POA: Diagnosis not present

## 2016-07-20 DIAGNOSIS — G4733 Obstructive sleep apnea (adult) (pediatric): Secondary | ICD-10-CM | POA: Diagnosis not present

## 2016-07-20 NOTE — Patient Instructions (Signed)

## 2016-07-25 ENCOUNTER — Encounter: Payer: Self-pay | Admitting: Pulmonary Disease

## 2016-07-25 DIAGNOSIS — G4733 Obstructive sleep apnea (adult) (pediatric): Secondary | ICD-10-CM | POA: Diagnosis not present

## 2016-07-27 ENCOUNTER — Encounter: Payer: Self-pay | Admitting: Pulmonary Disease

## 2016-07-27 ENCOUNTER — Ambulatory Visit (INDEPENDENT_AMBULATORY_CARE_PROVIDER_SITE_OTHER): Payer: Medicare PPO | Admitting: Pulmonary Disease

## 2016-07-27 DIAGNOSIS — E669 Obesity, unspecified: Secondary | ICD-10-CM | POA: Diagnosis not present

## 2016-07-27 DIAGNOSIS — G4733 Obstructive sleep apnea (adult) (pediatric): Secondary | ICD-10-CM | POA: Diagnosis not present

## 2016-07-27 NOTE — Progress Notes (Signed)
   Subjective:    Patient ID: KIEU QUIGGLE, female    DOB: 04-Oct-1940, 76 y.o.   MRN: [id number]  HPI  76 yo female followed for Moderate OSA on CPAP   She has adjusted well to her CPAP machine and wakes up feeling rested. Takes two-hour nap daily but denies excessive daytime somnolence or snoring. No problems with mask or pressure. Denies dryness of mouth. Her main issue is taking care of the machine and she is wondering if she needs to buy a soclean device  She otherwise leads an active lifestyle  I have reviewed CPAP download which shows good control of events with average pressure of 9.5 cm on auto settings with minimal leak in excellent usage about 5.5 hours per night  Significant tests/ events reviewed  HST 11/08/15 AHI 28/h     Review of Systems Patient denies significant dyspnea,cough, hemoptysis,  chest pain, palpitations, pedal edema, orthopnea, paroxysmal nocturnal dyspnea, lightheadedness, nausea, vomiting, abdominal or  leg pains      Objective:   Physical Exam  Gen. Pleasant, obese, in no distress ENT - no lesions, no post nasal drip Neck: No JVD, no thyromegaly, no carotid bruits Lungs: no use of accessory muscles, no dullness to percussion, decreased without rales or rhonchi  Cardiovascular: Rhythm regular, heart sounds  normal, no murmurs or gallops, no peripheral edema Musculoskeletal: No deformities, no cyanosis or clubbing , no tremors       Assessment & Plan:

## 2016-07-27 NOTE — Assessment & Plan Note (Signed)
Weight loss encouraged 

## 2016-07-27 NOTE — Addendum Note (Signed)
Addended by: Chase Picket A on: 07/27/2016 02:42 PM   Modules accepted: Orders

## 2016-07-27 NOTE — Patient Instructions (Signed)
CPAP is working well, change to 10 cm pressure

## 2016-07-27 NOTE — Assessment & Plan Note (Signed)
We'll change to a fixed pressure 10 cm and reviewed download  Weight loss encouraged, compliance with goal of at least 4-6 hrs every night is the expectation. Advised against medications with sedative side effects Cautioned against driving when sleepy - understanding that sleepiness will vary on a day to day basis

## 2016-08-01 ENCOUNTER — Ambulatory Visit: Payer: PRIVATE HEALTH INSURANCE

## 2016-08-01 ENCOUNTER — Encounter: Payer: Self-pay | Admitting: Podiatry

## 2016-08-01 ENCOUNTER — Ambulatory Visit (INDEPENDENT_AMBULATORY_CARE_PROVIDER_SITE_OTHER): Payer: PRIVATE HEALTH INSURANCE

## 2016-08-01 ENCOUNTER — Ambulatory Visit (INDEPENDENT_AMBULATORY_CARE_PROVIDER_SITE_OTHER): Payer: Medicare PPO | Admitting: Podiatry

## 2016-08-01 DIAGNOSIS — M722 Plantar fascial fibromatosis: Secondary | ICD-10-CM | POA: Diagnosis not present

## 2016-08-01 MED ORDER — TRIAMCINOLONE ACETONIDE 10 MG/ML IJ SUSP
10.0000 mg | Freq: Once | INTRAMUSCULAR | Status: AC
  Administered 2016-08-01: 10 mg

## 2016-08-02 NOTE — Progress Notes (Signed)
Subjective:    Patient ID: Sierra Ford, female   DOB: 76 y.o.   MRN: [id number]   HPI patient presents with pain in the plantar aspect left heel that's been intense in nature over 3 weeks    ROS      Objective:  Physical Exam neurovascular status intact negative Homans sign was noted with exquisite discomfort plantar aspect left heel     Assessment:    Acute plantar fasciitis left     Plan:  Careful reinjection plantar fashion 3 mg Kenalog 5 mill grams Xylocaine and reviewed x-rays and dispensed fascial brace with instructions on usage  X-rays indicated small spur with no indication of stress fracture or arthritis

## 2016-08-06 ENCOUNTER — Ambulatory Visit: Payer: Medicare Other | Admitting: Pulmonary Disease

## 2016-08-08 ENCOUNTER — Ambulatory Visit: Payer: PRIVATE HEALTH INSURANCE | Admitting: Podiatry

## 2016-08-09 ENCOUNTER — Other Ambulatory Visit: Payer: Self-pay | Admitting: Family Medicine

## 2016-08-09 DIAGNOSIS — I10 Essential (primary) hypertension: Secondary | ICD-10-CM

## 2016-08-09 DIAGNOSIS — R600 Localized edema: Secondary | ICD-10-CM

## 2016-08-19 DIAGNOSIS — G4733 Obstructive sleep apnea (adult) (pediatric): Secondary | ICD-10-CM | POA: Diagnosis not present

## 2016-09-19 DIAGNOSIS — G4733 Obstructive sleep apnea (adult) (pediatric): Secondary | ICD-10-CM | POA: Diagnosis not present

## 2016-09-26 ENCOUNTER — Ambulatory Visit (INDEPENDENT_AMBULATORY_CARE_PROVIDER_SITE_OTHER): Payer: Medicare PPO | Admitting: Podiatry

## 2016-09-26 ENCOUNTER — Encounter: Payer: Self-pay | Admitting: Podiatry

## 2016-09-26 DIAGNOSIS — M722 Plantar fascial fibromatosis: Secondary | ICD-10-CM | POA: Diagnosis not present

## 2016-09-26 MED ORDER — TRIAMCINOLONE ACETONIDE 10 MG/ML IJ SUSP
10.0000 mg | Freq: Once | INTRAMUSCULAR | Status: AC
  Administered 2016-09-26: 10 mg

## 2016-09-26 NOTE — Progress Notes (Signed)
Subjective:    Patient ID: Sierra Ford, female   DOB: 76 y.o.   MRN: [id number]   HPI patient states that she's had a flareup in her left heel and it's making it difficult for her to walk    ROS      Objective:  Physical Exam neurovascular status intact with patient noted to have acute inflammation left plantar fashion at the insertional point of the tendon into the calcaneus     Assessment:   Acute plantar fasciitis left with inflammation fluid buildup      Plan:    At this time due to the acute nature were just can to treat conservatively and I injected the fascia 3 mg Kenalog 5 mg Xylocaine and I then went ahead and advised on supportive shoe gear and if symptoms persist patient be seen back to recheck

## 2016-09-27 ENCOUNTER — Other Ambulatory Visit (INDEPENDENT_AMBULATORY_CARE_PROVIDER_SITE_OTHER): Payer: Medicare PPO

## 2016-09-27 ENCOUNTER — Other Ambulatory Visit: Payer: Medicare PPO

## 2016-09-27 DIAGNOSIS — E785 Hyperlipidemia, unspecified: Secondary | ICD-10-CM

## 2016-09-27 DIAGNOSIS — E119 Type 2 diabetes mellitus without complications: Secondary | ICD-10-CM | POA: Diagnosis not present

## 2016-09-27 LAB — COMPREHENSIVE METABOLIC PANEL
ALBUMIN: 4.3 g/dL (ref 3.5–5.2)
ALK PHOS: 75 U/L (ref 39–117)
ALT: 27 U/L (ref 0–35)
AST: 23 U/L (ref 0–37)
BUN: 16 mg/dL (ref 6–23)
CO2: 28 mEq/L (ref 19–32)
CREATININE: 0.82 mg/dL (ref 0.40–1.20)
Calcium: 9.5 mg/dL (ref 8.4–10.5)
Chloride: 108 mEq/L (ref 96–112)
GFR: 87.4 mL/min (ref >60.00)
GLUCOSE: 119 mg/dL — AB (ref 70–99)
POTASSIUM: 4.1 meq/L (ref 3.5–5.1)
SODIUM: 141 meq/L (ref 135–145)
TOTAL PROTEIN: 7 g/dL (ref 6.0–8.3)
Total Bilirubin: 1.4 mg/dL — ABNORMAL HIGH (ref 0.2–1.2)

## 2016-09-27 LAB — LIPID PANEL
CHOLESTEROL: 200 mg/dL (ref 0–200)
HDL: 101 mg/dL (ref >39.00)
LDL Cholesterol: 87 mg/dL (ref 0–99)
NONHDL: 99.18
Total CHOL/HDL Ratio: 2
Triglycerides: 62 mg/dL (ref 0.0–149.0)
VLDL: 12.4 mg/dL (ref 0.0–40.0)

## 2016-09-27 LAB — HEMOGLOBIN A1C: HEMOGLOBIN A1C: 6.7 % — AB (ref 4.6–6.5)

## 2016-10-01 DIAGNOSIS — H6123 Impacted cerumen, bilateral: Secondary | ICD-10-CM | POA: Insufficient documentation

## 2016-10-01 DIAGNOSIS — H9193 Unspecified hearing loss, bilateral: Secondary | ICD-10-CM | POA: Diagnosis not present

## 2016-10-01 DIAGNOSIS — Z87891 Personal history of nicotine dependence: Secondary | ICD-10-CM | POA: Diagnosis not present

## 2016-10-01 DIAGNOSIS — H93293 Other abnormal auditory perceptions, bilateral: Secondary | ICD-10-CM | POA: Insufficient documentation

## 2016-10-01 DIAGNOSIS — Z7289 Other problems related to lifestyle: Secondary | ICD-10-CM | POA: Diagnosis not present

## 2016-10-03 ENCOUNTER — Encounter: Payer: Self-pay | Admitting: Podiatry

## 2016-10-03 ENCOUNTER — Ambulatory Visit (INDEPENDENT_AMBULATORY_CARE_PROVIDER_SITE_OTHER): Payer: Medicare PPO | Admitting: Podiatry

## 2016-10-03 DIAGNOSIS — M722 Plantar fascial fibromatosis: Secondary | ICD-10-CM | POA: Diagnosis not present

## 2016-10-03 MED ORDER — TRIAMCINOLONE ACETONIDE 10 MG/ML IJ SUSP
10.0000 mg | Freq: Once | INTRAMUSCULAR | Status: AC
  Administered 2016-10-03: 10 mg

## 2016-10-04 NOTE — Progress Notes (Signed)
Subjective:    Patient ID: Sierra Ford, female   DOB: 76 y.o.   MRN: [id number]   HPI patient states she has improved some but is still having a spot of pain in her left heel    ROS      Objective:  Physical Exam neurovascular status intact with patient's left heel showing a continued inflammatory area in the plantar fascia at the insertional point calcaneus     Assessment:    Plantar fasciitis still noted with moderate improvement     Plan:    Reinjected the plantar fascia 3 mg Kenalog 5 mill grams Xylocaine and instructed on physical therapy supportive shoes and reappoint to recheck

## 2016-10-09 ENCOUNTER — Ambulatory Visit (INDEPENDENT_AMBULATORY_CARE_PROVIDER_SITE_OTHER): Payer: Medicare PPO | Admitting: Family Medicine

## 2016-10-09 ENCOUNTER — Encounter: Payer: Self-pay | Admitting: Family Medicine

## 2016-10-09 VITALS — BP 128/78 | HR 67 | Ht 62.0 in | Wt 186.0 lb

## 2016-10-09 DIAGNOSIS — R7989 Other specified abnormal findings of blood chemistry: Secondary | ICD-10-CM

## 2016-10-09 DIAGNOSIS — I5032 Chronic diastolic (congestive) heart failure: Secondary | ICD-10-CM

## 2016-10-09 DIAGNOSIS — E785 Hyperlipidemia, unspecified: Secondary | ICD-10-CM | POA: Diagnosis not present

## 2016-10-09 DIAGNOSIS — Z0000 Encounter for general adult medical examination without abnormal findings: Secondary | ICD-10-CM

## 2016-10-09 NOTE — Progress Notes (Signed)
Subjective:   Sierra Ford is a 76 y.o. female who presents for Medicare Annual (Subsequent) preventive examination.  Review of Systems:   Review of Systems  Constitutional: Negative for activity change, appetite change and fatigue.  HENT: Negative for hearing loss, congestion, tinnitus and ear discharge.   Eyes: Negative for visual disturbance (see optho q1y -- vision corrected to 20/20 with glasses).  Respiratory: Negative for cough, chest tightness and shortness of breath.   Cardiovascular: Negative for chest pain, palpitations and leg swelling.  Gastrointestinal: Negative for abdominal pain, diarrhea, constipation and abdominal distention.  Genitourinary: Negative for urgency, frequency, decreased urine volume and difficulty urinating.  Musculoskeletal: Negative for back pain, arthralgias and gait problem.  Skin: Negative for color change, pallor and rash.  Neurological: Negative for dizziness, light-headedness, numbness and headaches.  Hematological: Negative for adenopathy. Does not bruise/bleed easily.  Psychiatric/Behavioral: Negative for suicidal ideas, confusion, sleep disturbance, self-injury, dysphoric mood, decreased concentration and agitation.  Pt is able to read and write and can do all ADLs No risk for falling No abuse/ violence in home         Objective:     Vitals: BP 128/78   Pulse 67   Ht 5\' 2"  (1.575 m)   Wt 186 lb (84.4 kg)   SpO2 96%   BMI 34.02 kg/m   Body mass index is 34.02 kg/m. BP 128/78   Pulse 67   Ht 5\' 2"  (1.575 m)   Wt 186 lb (84.4 kg)   SpO2 96%   BMI 34.02 kg/m  General appearance: alert, cooperative, appears stated age and no distress Head: Normocephalic, without obvious abnormality, atraumatic Eyes: negative findings: lids and lashes normal, conjunctivae and sclerae normal and pupils equal, round, reactive to light and accomodation Ears: normal TM's and external ear canals both ears Nose: Nares normal. Septum midline. Mucosa  normal. No drainage or sinus tenderness. Throat: lips, mucosa, and tongue normal; teeth and gums normal Neck: no adenopathy, no carotid bruit, no JVD, supple, symmetrical, trachea midline and thyroid not enlarged, symmetric, no tenderness/mass/nodules Back: symmetric, no curvature. ROM normal. No CVA tenderness. Lungs: clear to auscultation bilaterally Heart: regular rate and rhythm, S1, S2 normal, no murmur, click, rub or gallop Abdomen: soft, non-tender; bowel sounds normal; no masses,  no organomegaly Extremities: extremities normal, atraumatic, no cyanosis or edema Pulses: 2+ and symmetric Skin: Skin color, texture, turgor normal. No rashes or lesions Lymph nodes: Cervical, supraclavicular, and axillary nodes normal. Neurologic: Alert and oriented X 3, normal strength and tone. Normal symmetric reflexes. Normal coordination and gait  Tobacco History  Smoking Status  . Former Smoker  . Packs/day: 1.00  . Years: 50.00  . Types: Cigarettes  . Quit date: 11/21/2009  Smokeless Tobacco  . Never Used     Counseling given: Not Answered   Past Medical History:  Diagnosis Date  . Chest pain    CLite with apical ischemia in 2006 - normal coronary arteries by [facility] in 12/2004;  Myoview 11/12:  Low risk stress nuclear study with a small, partially reversible apical defect most likely related to apical thinning; cannot R/O very mild apical ischemia.  EF: 75%   . DVT (deep venous thrombosis) ([place]) 2006   hx of  . Ectopic pregnancy with intrauterine pregnancy   . Hiatal hernia   . Hyperlipidemia   . PE (pulmonary embolism) 2006   hx of  . UTI (lower urinary tract infection)    hx   Past Surgical History:  Procedure Laterality Date  . ABDOMINAL EXPLORATION SURGERY    . BUNIONECTOMY     right  . ECTOPIC PREGNANCY SURGERY    . FOOT SURGERY     Family History  Problem Relation Age of Onset  . Heart failure Mother        died from  . Colon polyps Mother   . Diabetes Maternal Web designer  . Diabetes Maternal Aunt    History  Sexual Activity  . Sexual activity: Not Currently  . Partners: Male    Outpatient Encounter Prescriptions as of 10/09/2016  Medication Sig  . aspirin EC 81 MG tablet Take 81 mg by mouth at bedtime.    Marland Kitchen CALCIUM-VITAMIN D PO Take 600 Units by mouth 2 (two) times daily.    . cholecalciferol (VITAMIN D-400) 400 UNITS TABS Take 400 Units by mouth 2 (two) times daily.   . Coenzyme Q10 100 MG TABS Take 100 mg by mouth daily.  Marland Kitchen ezetimibe (ZETIA) 10 MG tablet TAKE ONE TABLET BY MOUTH ONCE DAILY  . Flaxseed, Linseed, (FLAXSEED OIL PO) Take by mouth.  . furosemide (LASIX) 40 MG tablet TAKE ONE TABLET BY MOUTH ONCE DAILY  . Multiple Vitamin (MULTIVITAMIN PO) Take 1 tablet by mouth at bedtime.   . Omega-3 Fatty Acids (FISH OIL PO) Take by mouth.  . Potassium (POTASSIMIN PO) Take 1 tablet by mouth as directed.  . rosuvastatin (CRESTOR) 20 MG tablet Take 0.5 tablets (10 mg total) by mouth daily.   No facility-administered encounter medications on file as of 10/09/2016.     Activities of Daily Living In your present state of health, do you have any difficulty performing the following activities: 10/09/2016  Hearing? N  Vision? N  Difficulty concentrating or making decisions? N  Walking or climbing stairs? N  Dressing or bathing? N  Doing errands, shopping? N  Some recent data might be hidden    Patient Care Team: Carollee Herter, Alferd Apa, DO as PCP - General Maisie Fus, MD as Consulting Physician (Obstetrics and Gynecology) Minus Breeding, MD as Consulting Physician (Cardiology) Collene Gobble, MD as Consulting Physician (Pulmonary Disease)    Assessment:    cpe  Exercise Activities and Dietary recommendations Current Exercise Habits: Structured exercise class, Type of exercise: strength training/weights;calisthenics, Time (Minutes): 60, Frequency (Times/Week): 5, Weekly Exercise (Minutes/Week): 300, Intensity: Moderate,  Exercise limited by: cardiac condition(s);None identified  Goals    . LDL CALC < 100      Fall Risk Fall Risk  07/15/2015 05/20/2014 04/24/2013  Falls in the past year? No No No   Depression Screen PHQ 2/9 Scores 10/09/2016 07/15/2015 05/20/2014 04/24/2013  PHQ - 2 Score 0 0 1 0     Cognitive Function MMSE - Mini Mental State Exam 10/09/2016 07/15/2015  Orientation to time 5 5  Orientation to Place 5 5  Registration 3 3  Attention/ Calculation 5 5  Recall 3 3  Language- name 2 objects 2 2  Language- repeat 1 1  Language- follow 3 step command 3 3  Language- read & follow direction 1 1  Write a sentence 1 1  Copy design 1 1  Total score 30 30        Immunization History  Administered Date(s) Administered  . Pneumococcal Conjugate-13 04/24/2013  . Pneumococcal Polysaccharide-23 08/07/2007  . Td 03/03/2003  . Zoster 08/14/2007, 11/13/2007   Screening Tests Health Maintenance  Topic Date Due  . FOOT  EXAM  09/18/1950  . OPHTHALMOLOGY EXAM  09/18/1950  . TETANUS/TDAP  03/02/2013  . URINE MICROALBUMIN  05/20/2015  . MAMMOGRAM  11/20/2015  . INFLUENZA VACCINE  09/19/2016  . HEMOGLOBIN A1C  03/30/2017  . COLONOSCOPY  10/22/2017  . DEXA SCAN  Completed  . PNA vac Low Risk Adult  Completed      Plan:    cpe    I have personally reviewed and noted the following in the patient's chart:   . Medical and social history . Use of alcohol, tobacco or illicit drugs  . Current medications and supplements . Functional ability and status . Nutritional status . Physical activity . Advanced directives . List of other physicians . Hospitalizations, surgeries, and ER visits in previous 12 months . Vitals . Screenings to include cognitive, depression, and falls . Referrals and appointments  In addition, I have reviewed and discussed with patient certain preventive protocols, quality metrics, and best practice recommendations. A written personalized care plan for preventive services  as well as general preventive health recommendations were provided to patient.   1. Encounter for Medicare annual wellness exam See above  2. Hyperlipidemia, unspecified hyperlipidemia type Tolerating statin, encouraged heart healthy diet, avoid trans fats, minimize simple carbs and saturated fats. Increase exercise as tolerated  3. Other specified abnormal findings of blood chemistry   4. Chronic diastolic heart failure [facility]) Per cardiology  Ann Held, DO  10/09/2016

## 2016-10-09 NOTE — Assessment & Plan Note (Signed)
Stable Per cardiology 

## 2016-10-09 NOTE — Assessment & Plan Note (Signed)
Tolerating statin, encouraged heart healthy diet, avoid trans fats, minimize simple carbs and saturated fats. Increase exercise as tolerated 

## 2016-10-09 NOTE — Patient Instructions (Signed)
Preventive Care 65 Years and Older, Female Preventive care refers to lifestyle choices and visits with your health care provider that can promote health and wellness. What does preventive care include?  A yearly physical exam. This is also called an annual well check.  Dental exams once or twice a year.  Routine eye exams. Ask your health care provider how often you should have your eyes checked.  Personal lifestyle choices, including: ? Daily care of your teeth and gums. ? Regular physical activity. ? Eating a healthy diet. ? Avoiding tobacco and drug use. ? Limiting alcohol use. ? Practicing safe sex. ? Taking low-dose aspirin every day. ? Taking vitamin and mineral supplements as recommended by your health care provider. What happens during an annual well check? The services and screenings done by your health care provider during your annual well check will depend on your age, overall health, lifestyle risk factors, and family history of disease. Counseling Your health care provider may ask you questions about your:  Alcohol use.  Tobacco use.  Drug use.  Emotional well-being.  Home and relationship well-being.  Sexual activity.  Eating habits.  History of falls.  Memory and ability to understand (cognition).  Work and work environment.  Reproductive health.  Screening You may have the following tests or measurements:  Height, weight, and BMI.  Blood pressure.  Lipid and cholesterol levels. These may be checked every 5 years, or more frequently if you are over 50 years old.  Skin check.  Lung cancer screening. You may have this screening every year starting at age 55 if you have a 30-pack-year history of smoking and currently smoke or have quit within the past 15 years.  Fecal occult blood test (FOBT) of the stool. You may have this test every year starting at age 50.  Flexible sigmoidoscopy or colonoscopy. You may have a sigmoidoscopy every 5 years or  a colonoscopy every 10 years starting at age 50.  Hepatitis C blood test.  Hepatitis B blood test.  Sexually transmitted disease (STD) testing.  Diabetes screening. This is done by checking your blood sugar (glucose) after you have not eaten for a while (fasting). You may have this done every 1-3 years.  Bone density scan. This is done to screen for osteoporosis. You may have this done starting at age 65.  Mammogram. This may be done every 1-2 years. Talk to your health care provider about how often you should have regular mammograms.  Talk with your health care provider about your test results, treatment options, and if necessary, the need for more tests. Vaccines Your health care provider may recommend certain vaccines, such as:  Influenza vaccine. This is recommended every year.  Tetanus, diphtheria, and acellular pertussis (Tdap, Td) vaccine. You may need a Td booster every 10 years.  Varicella vaccine. You may need this if you have not been vaccinated.  Zoster vaccine. You may need this after age 60.  Measles, mumps, and rubella (MMR) vaccine. You may need at least one dose of MMR if you were born in 1957 or later. You may also need a second dose.  Pneumococcal 13-valent conjugate (PCV13) vaccine. One dose is recommended after age 65.  Pneumococcal polysaccharide (PPSV23) vaccine. One dose is recommended after age 65.  Meningococcal vaccine. You may need this if you have certain conditions.  Hepatitis A vaccine. You may need this if you have certain conditions or if you travel or work in places where you may be exposed to hepatitis   A.  Hepatitis B vaccine. You may need this if you have certain conditions or if you travel or work in places where you may be exposed to hepatitis B.  Haemophilus influenzae type b (Hib) vaccine. You may need this if you have certain conditions.  Talk to your health care provider about which screenings and vaccines you need and how often you  need them. This information is not intended to replace advice given to you by your health care provider. Make sure you discuss any questions you have with your health care provider. Document Released: 03/04/2015 Document Revised: 10/26/2015 Document Reviewed: 12/07/2014 Elsevier Interactive Patient Education  2017 [employer].

## 2016-10-09 NOTE — Assessment & Plan Note (Signed)
Check labs 

## 2016-11-02 ENCOUNTER — Encounter: Payer: Medicare PPO | Admitting: Family Medicine

## 2016-11-06 DIAGNOSIS — G4733 Obstructive sleep apnea (adult) (pediatric): Secondary | ICD-10-CM | POA: Diagnosis not present

## 2016-11-12 NOTE — Progress Notes (Signed)
Patient ID: Sierra Ford                 DOB: 1940-09-14                    MRN: [id number]     HPI: Sierra Ford is a 76 y.o. female patient referred to lipid clinic by Dr. Percival Spanish. PMH is significant for HLD, DVT (2006), PE (2006), and OSA. Pt has a history of statin intolerance with atorvastatin, pravastatin, rosuvastatin 20mg  plusLovaza, but did not tolerate these due to myalgias. Currently taking Crestor 10mg  daily and Zetia 10mg  daily and tolerating them well. Pt prefers to follow up every 6 months with lipid panel and office visit; this helps with accountability for lifestyle improvements. At [facility] visit on 11/16/2015, rosuvastatin 10 mg and ezetimibe 10 mg were continued as LDL <70.  Pt presents today in good spirits. She is tolerating Crestor and Zetia well with no complaints. Her lipid panel from yesterday, 11/13/2016, was within conservative goal of LDL 100mg /dL and almost at strict goal of 70mg /dL. She continues to exercise at [employer] 3 times a week, and does water aerobics 5 times a week.  She states that she has not had a great diet the past few weeks d/t traveling and watching the grandkids, but is ready to get back to her diet and keep her cholesterol levels low. She says that she eats ice cream (Ben and Jerry's sized containers) about 2-3x/week which has decreased from her last visit (4x/wk). She would like to decrease the amount of cookout milkshakes that she is drinking (1/wk).   Current Medications: rosuvastatin 10mg  daily - max tolerated dose, ezetimibe 10mg  daily  Intolerances: rosuvastatin 20mg  - muscle aches, atorvastatin and pravastatin - muscle aches, Lovaza - muscle aches  Risk Factors: Carotid Artery Disease- mild-moderate carotid plaque on ultrasound  LDL goal: 100mg /dL- conservative goal. Will aim for <70 due to carotid plaque.  Diet: Has been trying to eat more chicken - does fry it occasionally. Eats a lot of spinach, kale and collard greens.  Tries to limit her red meat. Does eat Suezanne Jacquet and Jerry's frequently, at least 4 days a week. Encourage substituting with Halo Top. Avoids cooking carbohydrates, but will eat rice/pasta/potatoes when she goes out to eat.  Usually drinks water with flavor packets and an occasional diet soda. Dr. Carollee Herter encouraged heart healthy diet, avoid trans fats, minimize simple carbs and saturated fats at [place] on 10/09/16.  Exercise: Pt exercises with [employer] three times a week and does water aerobics 5x/week.  Family History: Mother died from HF, mother had colon polyps and Alzheimer's disease, aunt and granddaughter has diabetes.  Social History: Former smoker: 1ppd x 50 years. Quit date: 11/21/2009. Minimal alcohol use, no drug use.  Labs: 11/13/16: TC 197, TG 55, HDL 112, LDL 74 (Crestor 10mg  daily, Zetia 10mg  daily) 09/27/16: TC 200, TG 62, HDL 101, LDL 87 (Crestor 10mg  daily, Zetia 10mg  daily) 05/15/16: TC 217, TG 76, HDL 115, LDL 87 (Crestor 10mg  daily, Zetia 10mg  daily) 10/2015: TC 194, TG 97, HDL 106, LDL 69 (Crestor 10mg  daily, Zetia 10mg  daily) 04/2015: TC 232, TG 70, HDL 127, LDL 91, LFTs wnl (Crestor 10mg  daily) 10/2014: TC 262, TG 93, HDL 105.4, LDL 138, LFTs wnl (Crestor 10mg  daily) - admits to many dietary indiscretions this summer 05/2014: TC 184, TG 75, HDL 82.7, LDL 86, LFTs wnl (Crestor 10mg  daily)  Past Medical History:  Diagnosis Date  .  Chest pain    CLite with apical ischemia in 2006 - normal coronary arteries by [facility] in 12/2004;  Myoview 11/12:  Low risk stress nuclear study with a small, partially reversible apical defect most likely related to apical thinning; cannot R/O very mild apical ischemia.  EF: 75%   . DVT (deep venous thrombosis) ([place]) 2006   hx of  . Ectopic pregnancy with intrauterine pregnancy   . Hiatal hernia   . Hyperlipidemia   . PE (pulmonary embolism) 2006   hx of  . UTI (lower urinary tract infection)    hx    Current Outpatient Prescriptions on File Prior  to Visit  Medication Sig Dispense Refill  . aspirin EC 81 MG tablet Take 81 mg by mouth at bedtime.      Marland Kitchen CALCIUM-VITAMIN D PO Take 600 Units by mouth 2 (two) times daily.      . cholecalciferol (VITAMIN D-400) 400 UNITS TABS Take 400 Units by mouth 2 (two) times daily.     . Coenzyme Q10 100 MG TABS Take 100 mg by mouth daily.    Marland Kitchen ezetimibe (ZETIA) 10 MG tablet TAKE ONE TABLET BY MOUTH ONCE DAILY 90 tablet 2  . Flaxseed, Linseed, (FLAXSEED OIL PO) Take by mouth.    . furosemide (LASIX) 40 MG tablet TAKE ONE TABLET BY MOUTH ONCE DAILY 90 tablet 3  . Multiple Vitamin (MULTIVITAMIN PO) Take 1 tablet by mouth at bedtime.     . Omega-3 Fatty Acids (FISH OIL PO) Take by mouth.    . Potassium (POTASSIMIN PO) Take 1 tablet by mouth as directed.    . rosuvastatin (CRESTOR) 20 MG tablet Take 0.5 tablets (10 mg total) by mouth daily. 45 tablet 0  . [DISCONTINUED] omega-3 acid ethyl esters (LOVAZA) 1 G capsule Take 1 g by mouth 3 (three) times daily.      . [DISCONTINUED] pravastatin (PRAVACHOL) 20 MG tablet Take 20 mg by mouth daily.       No current facility-administered medications on file prior to visit.     No Known Allergies  Assessment/Plan:  1. Hyperlipidemia: LDL at conservative goal of <100, and is almost at her ideal goal of <70, given her history of carotid artery disease. Continue rosuvastatin 10 mg and ezetimibe 10 mg daily, as this is her maximum tolerated statin dose. We discussed dietary improvements. She has been splurging while on vacation, but has a plan of getting back to her diet of eating prepackaged salmon and chicken, and kale and collards. Pt seems very educated on what she should and should not eat in her diet to lower cholesterol.  Her goal for her next visit will be to decrease the amount of ice cream she eats/wk, to try to substitute Ben and Jerry's with Halo Top, and to decrease her consumption of Cookout milkshakes.  Encouraged pt to keep up the good work with exercise.  Follow up in 6 months per patient preference for hepatic/lipid panel and discussion regarding success of lifestyle modifications.   Pt seen with pharmacy student, Maple Mirza.  Mackena Plummer E. Daxter Paule, PharmD, CPP, [place] [address], [place], [place] [address] Phone: [phone]; Fax: [phone] 11/14/2016 2:02 PM

## 2016-11-13 ENCOUNTER — Other Ambulatory Visit: Payer: Self-pay | Admitting: Cardiology

## 2016-11-13 ENCOUNTER — Other Ambulatory Visit: Payer: Medicare PPO

## 2016-11-13 DIAGNOSIS — E785 Hyperlipidemia, unspecified: Secondary | ICD-10-CM

## 2016-11-13 LAB — HEPATIC FUNCTION PANEL
ALT: 23 IU/L (ref 0–32)
AST: 19 IU/L (ref 0–40)
Albumin: 4.1 g/dL (ref 3.5–4.8)
Alkaline Phosphatase: 84 IU/L (ref 39–117)
BILIRUBIN TOTAL: 0.6 mg/dL (ref 0.0–1.2)
Bilirubin, Direct: 0.15 mg/dL (ref 0.00–0.40)
Total Protein: 6.4 g/dL (ref 6.0–8.5)

## 2016-11-14 ENCOUNTER — Ambulatory Visit (INDEPENDENT_AMBULATORY_CARE_PROVIDER_SITE_OTHER): Payer: Medicare PPO | Admitting: Pharmacist

## 2016-11-14 DIAGNOSIS — E782 Mixed hyperlipidemia: Secondary | ICD-10-CM | POA: Diagnosis not present

## 2016-11-14 LAB — LIPID PANEL
CHOL/HDL RATIO: 1.8 ratio (ref 0.0–4.4)
CHOLESTEROL TOTAL: 197 mg/dL (ref 100–199)
HDL: 112 mg/dL (ref >39)
LDL Calculated: 74 mg/dL (ref 0–99)
TRIGLYCERIDES: 55 mg/dL (ref 0–149)
VLDL Cholesterol Cal: 11 mg/dL (ref 5–40)

## 2016-11-14 NOTE — Patient Instructions (Addendum)
Thank you for coming to see [name] today!  Continue taking your Crestor 10mg  daily and Zetia 10mg  daily.   Your cholesterol levels from 11/13/16 were great! Your total cholesterol was 197 and LDL was 74.  Keep up the good work!  Try to decrease the amount of ice cream per week.  Maybe try Halo Top ice cream.   We will see you in 6 months!  Have a great day!

## 2016-11-19 ENCOUNTER — Telehealth: Payer: Self-pay | Admitting: Cardiology

## 2016-11-19 NOTE — Telephone Encounter (Signed)
Left detailed message w/results per request

## 2016-11-19 NOTE — Telephone Encounter (Signed)
New Message    Pt is returning [place] call from Friday , please call her back and leave a complete message on her voicemail she is at the gym

## 2016-11-19 NOTE — Telephone Encounter (Signed)
Notes recorded by Vennie Homans on 11/16/2016 at 4:17 PM EDT Leave detailed message with result on pt voicemail  Result send to pt PCP via Epic ------  Notes recorded by Minus Breeding, MD on 11/15/2016 at 11:23 PM EDT Excellent lipid profile. No change in therapy. Call Ms. Zappulla with the results and send results to Carollee Herter, Alferd Apa, DO

## 2016-11-20 ENCOUNTER — Telehealth: Payer: Self-pay | Admitting: Podiatry

## 2016-11-20 NOTE — Telephone Encounter (Signed)
I informed pt we had last seen her 10/03/2016 and if she was continuing to have problems she should come in the see Dr. Paulla Dolly for evaluation and if necessary get the handicap sticker. Pt states understanding and asked if there was anything else Dr. Paulla Dolly could give her she had 2 shots in each foot and wanted to know if there was a cream. I told pt she could have up to 3 shots in each foot, she could discuss that and the cream at the next appt. Transferred pt to schedulers.

## 2016-11-20 NOTE — Telephone Encounter (Signed)
I am a pt of Dr. Mellody Drown and I am being treated for plantar fasciitis. I was wanting to know if I can get a handicap placard. Please call me back at [phone] and if I do not answer, you can leave a detailed message on my home answering machine. Thank you.

## 2016-11-22 ENCOUNTER — Encounter: Payer: Self-pay | Admitting: Podiatry

## 2016-11-22 ENCOUNTER — Ambulatory Visit (INDEPENDENT_AMBULATORY_CARE_PROVIDER_SITE_OTHER): Payer: Medicare PPO | Admitting: Podiatry

## 2016-11-22 DIAGNOSIS — M722 Plantar fascial fibromatosis: Secondary | ICD-10-CM | POA: Diagnosis not present

## 2016-11-22 MED ORDER — MELOXICAM 15 MG PO TABS: 15.0000 mg | ORAL_TABLET | Freq: Every day | ORAL | 2 refills | Status: DC

## 2016-11-22 NOTE — Progress Notes (Signed)
Subjective:    Patient ID: Sierra Ford, female   DOB: 76 y.o.   MRN: [id number]   HPI patient states that her left heel is really bothering her still and that it's not responded well to medication    ROS      Objective:  Physical Exam neurovascular status intact with exquisite discomfort left plantar fascia     Assessment:    Chronic plantar fasciitis left inflammation fluid buildup noted     Plan:    H&P condition reviewed and at this point I did discuss possible shockwave therapy for this which may be necessary but in a try first utilizing a boot and see if we can immobilize this and allowed to rest. Patient be seen back for [name] to recheck again and we'll start oral anti-inflammatory agent at this time

## 2016-12-20 ENCOUNTER — Ambulatory Visit: Payer: Medicare PPO | Admitting: Podiatry

## 2016-12-28 LAB — BASIC METABOLIC PANEL
BUN: 23 — AB (ref 4–21)
CREATININE: 0.8 (ref 0.5–1.1)
GLUCOSE: 111
POTASSIUM: 4.6 (ref 3.4–5.3)
Sodium: 142 (ref 137–147)

## 2016-12-28 LAB — LIPID PANEL
Cholesterol: 200 (ref 0–200)
HDL: 101 — AB (ref 35–70)
LDL Cholesterol: 84
Triglycerides: 73 (ref 40–160)

## 2016-12-28 LAB — CBC AND DIFFERENTIAL
HCT: 42 (ref 36–46)
Hemoglobin: 14.5 (ref 12.0–16.0)
Platelets: 241 (ref 150–399)
WBC: 4.1

## 2016-12-28 LAB — TSH: TSH: 1.27 (ref 0.41–5.90)

## 2016-12-28 LAB — HEMOGLOBIN A1C: Hemoglobin A1C: 6.7

## 2017-01-24 DIAGNOSIS — Z01419 Encounter for gynecological examination (general) (routine) without abnormal findings: Secondary | ICD-10-CM | POA: Diagnosis not present

## 2017-01-24 DIAGNOSIS — Z6834 Body mass index (BMI) 34.0-34.9, adult: Secondary | ICD-10-CM | POA: Diagnosis not present

## 2017-01-24 DIAGNOSIS — Z124 Encounter for screening for malignant neoplasm of cervix: Secondary | ICD-10-CM | POA: Diagnosis not present

## 2017-01-25 LAB — HM PAP SMEAR: HM Pap smear: NEGATIVE

## 2017-02-13 ENCOUNTER — Telehealth: Payer: Self-pay | Admitting: *Deleted

## 2017-02-14 NOTE — Telephone Encounter (Signed)
[facility] with attached lab results indicating "possible pre-diabetes"; forwarded to provider/SLS 12/27

## 2017-02-25 ENCOUNTER — Other Ambulatory Visit: Payer: Self-pay | Admitting: Cardiology

## 2017-03-01 DIAGNOSIS — G4733 Obstructive sleep apnea (adult) (pediatric): Secondary | ICD-10-CM | POA: Diagnosis not present

## 2017-03-06 ENCOUNTER — Encounter: Payer: Self-pay | Admitting: *Deleted

## 2017-03-12 DIAGNOSIS — G4733 Obstructive sleep apnea (adult) (pediatric): Secondary | ICD-10-CM | POA: Diagnosis not present

## 2017-03-12 DIAGNOSIS — M158 Other polyosteoarthritis: Secondary | ICD-10-CM | POA: Diagnosis not present

## 2017-03-12 DIAGNOSIS — E785 Hyperlipidemia, unspecified: Secondary | ICD-10-CM | POA: Diagnosis not present

## 2017-03-12 DIAGNOSIS — E669 Obesity, unspecified: Secondary | ICD-10-CM | POA: Diagnosis not present

## 2017-03-12 DIAGNOSIS — Z6834 Body mass index (BMI) 34.0-34.9, adult: Secondary | ICD-10-CM | POA: Diagnosis not present

## 2017-03-13 ENCOUNTER — Ambulatory Visit (INDEPENDENT_AMBULATORY_CARE_PROVIDER_SITE_OTHER): Payer: Medicare PPO | Admitting: Orthopaedic Surgery

## 2017-03-13 ENCOUNTER — Encounter (INDEPENDENT_AMBULATORY_CARE_PROVIDER_SITE_OTHER): Payer: Self-pay | Admitting: Orthopaedic Surgery

## 2017-03-13 ENCOUNTER — Ambulatory Visit (INDEPENDENT_AMBULATORY_CARE_PROVIDER_SITE_OTHER): Payer: Medicare PPO

## 2017-03-13 VITALS — Ht 62.0 in | Wt 191.0 lb

## 2017-03-13 DIAGNOSIS — M160 Bilateral primary osteoarthritis of hip: Secondary | ICD-10-CM | POA: Diagnosis not present

## 2017-03-13 DIAGNOSIS — M545 Low back pain, unspecified: Secondary | ICD-10-CM

## 2017-03-13 NOTE — Progress Notes (Signed)
Office Visit Note   Patient: Sierra Ford           Date of Birth: 02-08-1941           MRN: [id number] Visit Date: 03/13/2017              Requested by: [address], [place], [place] [address] [place], [place] [address] PCP: Carollee Herter, Alferd Apa, DO   Assessment & Plan: Visit Diagnoses:  1. Acute bilateral low back pain without sciatica   2. Bilateral primary osteoarthritis of hip     Plan: Patient says symptoms for 6 weeks but gets relief with sitting.  No limitation of walking.  She likely has some disc bulge contributing to her symptoms.  I plan to recheck her in 2 months.  If she gets increase pain or is unable to walk her normal distances she will call and can return earlier.  She has some bilateral hip osteoarthritis not particularly symptomatic at this point.  We discussed the degenerative anterolisthesis is present at L3-4.  Follow-Up Instructions: Return in about 2 months (around 05/11/2017).   Orders:  Orders Placed This Encounter  Procedures  . XR Lumbar Spine 2-3 Views   No orders of the defined types were placed in this encounter.     Procedures: No procedures performed   Clinical Data: No additional findings.   Subjective: Chief Complaint  Patient presents with  . Lower Back - Pain    HPI 77 year old female lives alone has had 6 weeks history of back pain when she standing.  She can stand about 10 minutes and then has onset of back pain.  She gets complete relief with sitting.  No pain radiates into her legs.  She is able to walk as far she wants and goes to exercise class on a regular basis.  She denies associated bowel or bladder symptoms.   Review of Systems 14 point review of systems performed.  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  History of chronic diastolic heart failure carotid artery disease.  Peripheral leg edema she uses teds.  Positive for sleep apnea  .otherwise negative as it pertains to HPI.   Objective: Vital Signs: Ht 5\' 2"  (1.575 m)   Wt 191 lb (86.6 kg)   BMI 34.93 kg/m   Physical Exam  Constitutional: She is oriented to person, place, and time. She appears well-developed.  HENT:  Head: Normocephalic.  Right Ear: External ear normal.  Left Ear: External ear normal.  Eyes: Pupils are equal, round, and reactive to light.  Neck: No tracheal deviation present. No thyromegaly present.  Cardiovascular: Normal rate.  Pulmonary/Chest: Effort normal.  Abdominal: Soft.  Neurological: She is alert and oriented to person, place, and time.  Skin: Skin is warm and dry.  Psychiatric: She has a normal mood and affect. Her behavior is normal.    Ortho Exam patient has mild lower extremity edema she has bilateral knee-high teds.  Distal pulses are intact.  There is limitation in internal rotation of both hips 15-20 degrees which gives her some groin pain.  Not able to figure for with limitation of external rotation and hip flexion.  Knee range of motion is full without significant crepitus.  Negative straight leg raising 90 degrees.  Knee and ankle jerk are trace and symmetrical.  Negative popliteal compression test.  No sciatic notch tenderness.  Tenderness  at the L3-L5 paraspinal level.  Negative percussion over the kidneys.  Specialty Comments:  No specialty comments available.  Imaging: Xr Lumbar Spine 2-3 Views  Result Date: 03/13/2017 AP and lateral lumbar x-rays are obtained and reviewed.  Patient has bilateral hip space narrowing with marginal osteophytes.  Right lumbar curvature with scoliosis less than 20 degrees.  L3-4 degenerative anterolisthesis with disc space narrowing.  Other lumbar levels have normal disc height. 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    PMFS History: Patient Active Problem List   Diagnosis Date Noted  . OSA (obstructive sleep apnea) 12/16/2015  . Dyspnea on  effort 06/12/2013  . Obesity (BMI 30-39.9) 04/24/2013  . Chest pain, unspecified 01/22/2011  . Abnormal stress test 01/22/2011  . Frequent urination 01/22/2011  . Chronic diastolic heart failure ([place]) 05/15/2009  . 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  . RASH-NONVESICULAR 04/20/2009  . CERVICAL STRAIN, WITH RADICULOPATHY 03/31/2009  . CAROTID ARTERY DISEASE 01/12/2009  . CERVICAL STRAIN 01/11/2009  . LEG EDEMA, RIGHT 07/27/2008  . HX, URINARY INFECTION 09/19/2006  . Hyperlipidemia 08/20/2006  . PREGNANCY, ECTOPIC NEC W/INTRAUTERINE PRG 08/20/2006  . FREQUENCY, URINARY 08/20/2006  . Other specified abnormal findings of blood chemistry 08/20/2006  . Personal history of venous thrombosis and embolism 08/20/2006   Past Medical History:  Diagnosis Date  . Chest pain    CLite with apical ischemia in 2006 - normal coronary arteries by [facility] in 12/2004;  Myoview 11/12:  Low risk stress nuclear study with a small, partially reversible apical defect most likely related to apical thinning; cannot R/O very mild apical ischemia.  EF: 75%   . DVT (deep venous thrombosis) ([place]) 2006   hx of  . Ectopic pregnancy with intrauterine pregnancy   . Hiatal hernia   . Hyperlipidemia   . PE (pulmonary embolism) 2006   hx of  . UTI (lower urinary tract infection)    hx    Family History  Problem Relation Age of Onset  . Heart failure Mother        died from  . Colon polyps Mother   . Diabetes Maternal Museum/gallery exhibitions officer  . Diabetes Maternal Aunt     Past Surgical History:  Procedure Laterality Date  . ABDOMINAL EXPLORATION SURGERY    . BUNIONECTOMY     right  . ECTOPIC PREGNANCY SURGERY    . FOOT SURGERY     Social History   Occupational History  . Occupation: [employer]  Tobacco Use  . Smoking status: Former Smoker    Packs/day: 1.00    Years: 50.00    Pack years: 50.00    Types: Cigarettes    Last attempt to quit: 11/21/2009    Years since quitting:  7.3  . Smokeless tobacco: Never Used  Substance and Sexual Activity  . Alcohol use: No  . Drug use: No  . Sexual activity: Not Currently    Partners: Male

## 2017-03-15 ENCOUNTER — Encounter: Payer: Self-pay | Admitting: Podiatry

## 2017-03-15 ENCOUNTER — Ambulatory Visit (INDEPENDENT_AMBULATORY_CARE_PROVIDER_SITE_OTHER): Payer: Medicare PPO | Admitting: Podiatry

## 2017-03-15 DIAGNOSIS — L600 Ingrowing nail: Secondary | ICD-10-CM

## 2017-03-15 NOTE — Progress Notes (Signed)
Subjective:   Patient ID: Sierra Ford, female   DOB: 77 y.o.   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  Explained procedure and risk and today I infiltrated the right hallux 60 mg like Marcaine mixture and remove the border exposed matrix and applied phenol 3 applications 30 seconds followed by alcohol lavage sterile dressing.  Given instructions on soaks and reappoint

## 2017-03-15 NOTE — Patient Instructions (Addendum)

## 2017-03-27 ENCOUNTER — Encounter: Payer: Self-pay | Admitting: Gastroenterology

## 2017-03-28 ENCOUNTER — Telehealth: Payer: Self-pay | Admitting: *Deleted

## 2017-03-28 NOTE — Telephone Encounter (Signed)
Left detailed message on machine that she needs to make an appointment to discuss lab results from [facility], possible pre-diabetic.

## 2017-04-08 ENCOUNTER — Encounter: Payer: Self-pay | Admitting: Family Medicine

## 2017-04-08 ENCOUNTER — Ambulatory Visit (HOSPITAL_BASED_OUTPATIENT_CLINIC_OR_DEPARTMENT_OTHER)
Admission: RE | Admit: 2017-04-08 | Discharge: 2017-04-08 | Disposition: A | Discharge status: Home / Self Care | Payer: Medicare PPO | Source: Ambulatory Visit | Attending: Family Medicine | Admitting: Family Medicine

## 2017-04-08 ENCOUNTER — Ambulatory Visit (INDEPENDENT_AMBULATORY_CARE_PROVIDER_SITE_OTHER): Payer: Medicare PPO | Admitting: Family Medicine

## 2017-04-08 VITALS — BP 126/70 | HR 61 | Temp 98.0°F | Resp 16 | Ht 62.0 in | Wt 198.4 lb

## 2017-04-08 DIAGNOSIS — E1165 Type 2 diabetes mellitus with hyperglycemia: Secondary | ICD-10-CM

## 2017-04-08 DIAGNOSIS — G4733 Obstructive sleep apnea (adult) (pediatric): Secondary | ICD-10-CM | POA: Diagnosis not present

## 2017-04-08 DIAGNOSIS — R0609 Other forms of dyspnea: Secondary | ICD-10-CM | POA: Insufficient documentation

## 2017-04-08 DIAGNOSIS — J984 Other disorders of lung: Secondary | ICD-10-CM | POA: Insufficient documentation

## 2017-04-08 DIAGNOSIS — R0602 Shortness of breath: Secondary | ICD-10-CM | POA: Diagnosis not present

## 2017-04-08 DIAGNOSIS — E1151 Type 2 diabetes mellitus with diabetic peripheral angiopathy without gangrene: Secondary | ICD-10-CM

## 2017-04-08 DIAGNOSIS — I700 Atherosclerosis of aorta: Secondary | ICD-10-CM | POA: Diagnosis not present

## 2017-04-08 DIAGNOSIS — IMO0002 Reserved for concepts with insufficient information to code with codable children: Secondary | ICD-10-CM

## 2017-04-08 DIAGNOSIS — I10 Essential (primary) hypertension: Secondary | ICD-10-CM | POA: Diagnosis not present

## 2017-04-08 DIAGNOSIS — E782 Mixed hyperlipidemia: Secondary | ICD-10-CM

## 2017-04-08 DIAGNOSIS — E119 Type 2 diabetes mellitus without complications: Secondary | ICD-10-CM | POA: Insufficient documentation

## 2017-04-08 DIAGNOSIS — E669 Obesity, unspecified: Secondary | ICD-10-CM | POA: Diagnosis not present

## 2017-04-08 DIAGNOSIS — I5032 Chronic diastolic (congestive) heart failure: Secondary | ICD-10-CM

## 2017-04-08 LAB — MICROALBUMIN / CREATININE URINE RATIO
Creatinine,U: 153.2 mg/dL
MICROALB UR: 1.1 mg/dL (ref 0.0–1.9)
Microalb Creat Ratio: 0.7 mg/g (ref 0.0–30.0)

## 2017-04-08 LAB — HEMOGLOBIN A1C: Hgb A1c MFr Bld: 6.8 % — ABNORMAL HIGH (ref 4.6–6.5)

## 2017-04-08 LAB — LIPID PANEL
CHOLESTEROL: 196 mg/dL (ref 0–200)
HDL: 90.6 mg/dL (ref >39.00)
LDL Cholesterol: 93 mg/dL (ref 0–99)
NONHDL: 105.14
Total CHOL/HDL Ratio: 2
Triglycerides: 59 mg/dL (ref 0.0–149.0)
VLDL: 11.8 mg/dL (ref 0.0–40.0)

## 2017-04-08 LAB — COMPREHENSIVE METABOLIC PANEL
ALK PHOS: 73 U/L (ref 39–117)
ALT: 25 U/L (ref 0–35)
AST: 22 U/L (ref 0–37)
Albumin: 4.1 g/dL (ref 3.5–5.2)
BILIRUBIN TOTAL: 0.8 mg/dL (ref 0.2–1.2)
BUN: 16 mg/dL (ref 6–23)
CO2: 28 mEq/L (ref 19–32)
Calcium: 9.5 mg/dL (ref 8.4–10.5)
Chloride: 104 mEq/L (ref 96–112)
Creatinine, Ser: 0.81 mg/dL (ref 0.40–1.20)
GFR: 88.52 mL/min (ref >60.00)
Glucose, Bld: 123 mg/dL — ABNORMAL HIGH (ref 70–99)
POTASSIUM: 4.4 meq/L (ref 3.5–5.1)
SODIUM: 139 meq/L (ref 135–145)
TOTAL PROTEIN: 6.9 g/dL (ref 6.0–8.3)

## 2017-04-08 MED ORDER — BLOOD GLUCOSE MONITOR KIT: PACK | 0 refills | Status: AC

## 2017-04-08 NOTE — Patient Instructions (Signed)

## 2017-04-08 NOTE — Assessment & Plan Note (Signed)
Pt given info for healthy weight and welllness

## 2017-04-08 NOTE — Assessment & Plan Note (Signed)
hgba1c to be checked, minimize simple carbs. Increase exercise as tolerated. Continue current meds Lab Results  Component Value Date   HGBA1C 6.7 12/28/2016

## 2017-04-08 NOTE — Assessment & Plan Note (Signed)
Check echo Pt c/o some inc sob with exertion  No chest pain F/u cardiology

## 2017-04-08 NOTE — Progress Notes (Signed)
Patient ID: Sierra Ford, female   DOB: 1940-10-18, 77 y.o.   MRN: [id number]     Subjective:  I acted as a Education administrator for Dr. Carollee Herter.  [name], [place]   Patient ID: Sierra Ford, female    DOB: [birth year], 77 y.o.   MRN: [id number]  Chief Complaint  Patient presents with  . lab results from [place]    HPI  Patient is in today for follow up labs from November from [facility].    Her a1c was up.  She has not tried to work on diet --- she is exercising.   Pt also c/o DOE.  She sees cardiology every 3 months-- Dr Percival Spanish annually.  No chest pain.  She has not see pulm since her pulm moved away.     Patient Care Team: Carollee Herter, Alferd Apa, DO as PCP - General Maisie Fus, MD as Consulting Physician (Obstetrics and Gynecology) Minus Breeding, MD as Consulting Physician (Cardiology) Collene Gobble, MD as Consulting Physician (Pulmonary Disease)   Past Medical History:  Diagnosis Date  . Chest pain    CLite with apical ischemia in 2006 - normal coronary arteries by [facility] in 12/2004;  Myoview 11/12:  Low risk stress nuclear study with a small, partially reversible apical defect most likely related to apical thinning; cannot R/O very mild apical ischemia.  EF: 75%   . DVT (deep venous thrombosis) ([place]) 2006   hx of  . Ectopic pregnancy with intrauterine pregnancy   . Hiatal hernia   . Hyperlipidemia   . PE (pulmonary embolism) 2006   hx of  . UTI (lower urinary tract infection)    hx    Past Surgical History:  Procedure Laterality Date  . ABDOMINAL EXPLORATION SURGERY    . BUNIONECTOMY     right  . ECTOPIC PREGNANCY SURGERY    . FOOT SURGERY      Family History  Problem Relation Age of Onset  . Heart failure Mother        died from  . Colon polyps Mother   . Diabetes Maternal Museum/gallery exhibitions officer  . Diabetes Maternal Aunt     Social History   Socioeconomic History  . Marital status: Divorced    Spouse name: Not on file  . Number of children:  Not on file  . Years of education: Not on file  . Highest education level: Not on file  Social Needs  . Financial resource strain: Not on file  . Food insecurity - worry: Not on file  . Food insecurity - inability: Not on file  . Transportation needs - medical: Not on file  . Transportation needs - non-medical: Not on file  Occupational History  . Occupation: [employer]  Tobacco Use  . Smoking status: Former Smoker    Packs/day: 1.00    Years: 50.00    Pack years: 50.00    Types: Cigarettes    Last attempt to quit: 11/21/2009    Years since quitting: 7.3  . Smokeless tobacco: Never Used  Substance and Sexual Activity  . Alcohol use: No  . Drug use: No  . Sexual activity: Not Currently    Partners: Male  Other Topics Concern  . Not on file  Social History Narrative   Regular exercise          Outpatient Medications Prior to Visit  Medication Sig Dispense Refill  . aspirin EC 81 MG  tablet Take 81 mg by mouth at bedtime.      Marland Kitchen CALCIUM-VITAMIN D PO Take 600 Units by mouth 2 (two) times daily.      . cholecalciferol (VITAMIN D-400) 400 UNITS TABS Take 400 Units by mouth 2 (two) times daily.     . Coenzyme Q10 100 MG TABS Take 100 mg by mouth daily.    Marland Kitchen ezetimibe (ZETIA) 10 MG tablet TAKE 1 TABLET BY MOUTH ONCE DAILY 90 tablet 2  . Flaxseed, Linseed, (FLAXSEED OIL PO) Take by mouth.    . furosemide (LASIX) 40 MG tablet TAKE ONE TABLET BY MOUTH ONCE DAILY 90 tablet 3  . meloxicam (MOBIC) 15 MG tablet Take 1 tablet (15 mg total) by mouth daily. 30 tablet 2  . Multiple Vitamin (MULTIVITAMIN PO) Take 1 tablet by mouth at bedtime.     . Omega-3 Fatty Acids (FISH OIL PO) Take by mouth.    . Potassium (POTASSIMIN PO) Take 1 tablet by mouth as directed.    . rosuvastatin (CRESTOR) 20 MG tablet Take 0.5 tablets (10 mg total) by mouth daily. 45 tablet 0   No facility-administered medications prior to visit.     No Known Allergies  Review of Systems    Constitutional: Negative for chills, fever and malaise/fatigue.  HENT: Negative for congestion and hearing loss.   Eyes: Negative for discharge.  Respiratory: Positive for shortness of breath. Negative for cough and sputum production.   Cardiovascular: Negative for chest pain, palpitations and leg swelling.  Gastrointestinal: Negative for abdominal pain, blood in stool, constipation, diarrhea, heartburn, nausea and vomiting.  Genitourinary: Negative for dysuria, frequency, hematuria and urgency.  Musculoskeletal: Negative for back pain, falls and myalgias.  Skin: Negative for rash.  Neurological: Negative for dizziness, sensory change, loss of consciousness, weakness and headaches.  Endo/Heme/Allergies: Negative for environmental allergies. Does not bruise/bleed easily.  Psychiatric/Behavioral: Negative for depression and suicidal ideas. The patient is not nervous/anxious and does not have insomnia.        Objective:    Physical Exam  Constitutional: She is oriented to person, place, and time. She appears well-developed and well-nourished.  HENT:  Head: Normocephalic and atraumatic.  Eyes: Conjunctivae and EOM are normal.  Neck: Normal range of motion. Neck supple. No JVD present. Carotid bruit is not present. No thyromegaly present.  Cardiovascular: Normal rate, regular rhythm and normal heart sounds.  No murmur heard. Pulmonary/Chest: Effort normal and breath sounds normal. No respiratory distress. She has no wheezes. She has no rales. She exhibits no tenderness.  Musculoskeletal: She exhibits no edema.  Neurological: She is alert and oriented to person, place, and time.  Psychiatric: She has a normal mood and affect. Her behavior is normal. Judgment and thought content normal.  Nursing note and vitals reviewed.   BP 126/70 (BP Location: Left Arm, Cuff Size: Large)   Pulse 61   Temp 98 F (36.7 C) (Oral)   Resp 16   Ht '5\' 2"'  (1.575 m)   Wt 198 lb 6.4 oz (90 kg)   SpO2 98%    BMI 36.29 kg/m  Wt Readings from Last 3 Encounters:  04/08/17 198 lb 6.4 oz (90 kg)  03/13/17 191 lb (86.6 kg)  10/09/16 186 lb (84.4 kg)   BP Readings from Last 3 Encounters:  04/08/17 126/70  10/09/16 128/78  07/27/16 128/64     Immunization History  Administered Date(s) Administered  . Pneumococcal Conjugate-13 04/24/2013  . Pneumococcal Polysaccharide-23 08/07/2007  . Td 03/03/2003  .  Zoster 08/14/2007, 11/13/2007    Health Maintenance  Topic Date Due  . FOOT EXAM  09/18/1951  . OPHTHALMOLOGY EXAM  09/18/1951  . TETANUS/TDAP  03/02/2013  . URINE MICROALBUMIN  05/20/2015  . MAMMOGRAM  11/20/2015  . INFLUENZA VACCINE  09/19/2016  . HEMOGLOBIN A1C  06/27/2017  . COLONOSCOPY  10/22/2017  . DEXA SCAN  Completed  . PNA vac Low Risk Adult  Completed    Lab Results  Component Value Date   WBC 4.1 12/28/2016   HGB 14.5 12/28/2016   HCT 42 12/28/2016   PLT 241 12/28/2016   GLUCOSE 119 (H) 09/27/2016   CHOL 200 12/28/2016   TRIG 73 12/28/2016   HDL 101 (A) 12/28/2016   LDLDIRECT 95.9 10/06/2012   LDLCALC 84 12/28/2016   ALT 23 11/13/2016   AST 19 11/13/2016   NA 142 12/28/2016   K 4.6 12/28/2016   CL 108 09/27/2016   CREATININE 0.8 12/28/2016   BUN 23 (A) 12/28/2016   CO2 28 09/27/2016   TSH 1.27 12/28/2016   INR 1.03 04/20/2009   HGBA1C 6.7 12/28/2016   MICROALBUR <0.7 05/20/2014    Lab Results  Component Value Date   TSH 1.27 12/28/2016   Lab Results  Component Value Date   WBC 4.1 12/28/2016   HGB 14.5 12/28/2016   HCT 42 12/28/2016   MCV 91.8 10/04/2015   PLT 241 12/28/2016   Lab Results  Component Value Date   NA 142 12/28/2016   K 4.6 12/28/2016   CO2 28 09/27/2016   GLUCOSE 119 (H) 09/27/2016   BUN 23 (A) 12/28/2016   CREATININE 0.8 12/28/2016   BILITOT 0.6 11/13/2016   ALKPHOS 84 11/13/2016   AST 19 11/13/2016   ALT 23 11/13/2016   PROT 6.4 11/13/2016   ALBUMIN 4.1 11/13/2016   CALCIUM 9.5 09/27/2016   GFR 87.40 09/27/2016    Lab Results  Component Value Date   CHOL 200 12/28/2016   Lab Results  Component Value Date   HDL 101 (A) 12/28/2016   Lab Results  Component Value Date   LDLCALC 84 12/28/2016   Lab Results  Component Value Date   TRIG 73 12/28/2016   Lab Results  Component Value Date   CHOLHDL 1.8 11/13/2016   Lab Results  Component Value Date   HGBA1C 6.7 12/28/2016         Assessment & Plan:   Problem List Items Addressed This Visit      Unprioritized   Chronic diastolic heart failure ([place])    Check echo Pt c/o some inc sob with exertion  No chest pain F/u cardiology      DM (diabetes mellitus) type II uncontrolled, periph vascular disorder ([place]) - Primary    hgba1c to be checked, minimize simple carbs. Increase exercise as tolerated. Continue current meds Lab Results  Component Value Date   HGBA1C 6.7 12/28/2016         Relevant Medications   blood glucose meter kit and supplies KIT   Other Relevant Orders   Microalbumin / creatinine urine ratio   Hemoglobin A1c   Comprehensive metabolic panel   Lipid panel   DOE (dyspnea on exertion)   Relevant Orders   ECHOCARDIOGRAM COMPLETE   DG Chest 2 View   Essential hypertension   Relevant Orders   ECHOCARDIOGRAM COMPLETE   Hyperlipidemia    Tolerating statin, encouraged heart healthy diet, avoid trans fats, minimize simple carbs and saturated fats. Increase exercise as tolerated  Obesity (BMI 30-39.9)    Pt given info for healthy weight and welllness        Other Visit Diagnoses    Obstructive sleep apnea syndrome       Relevant Orders   Ambulatory referral to Pulmonology      I am having Tommye B. Tiu start on blood glucose meter kit and supplies. I am also having her maintain her cholecalciferol, CALCIUM-VITAMIN D PO, Multiple Vitamin (MULTIVITAMIN PO), aspirin EC, rosuvastatin, Potassium (POTASSIMIN PO), Coenzyme Q10, (Flaxseed, Linseed, (FLAXSEED OIL PO)), Omega-3 Fatty Acids (FISH OIL PO),  furosemide, meloxicam, and ezetimibe.  Meds ordered this encounter  Medications  . blood glucose meter kit and supplies KIT    Sig: Dispense based on patient and insurance preference. Use up to four times daily as directed. (FOR ICD-9 250.00, 250.01).    Dispense:  1 each    Refill:  0    Order Specific Question:   Number of strips    Answer:   100    Order Specific Question:   Number of lancets    Answer:   100    CMA served as scribe during this visit. History, Physical and Plan performed by medical provider. Documentation and orders reviewed and attested to.  Ann Held, DO

## 2017-04-08 NOTE — Assessment & Plan Note (Signed)
Tolerating statin, encouraged heart healthy diet, avoid trans fats, minimize simple carbs and saturated fats. Increase exercise as tolerated 

## 2017-04-10 ENCOUNTER — Telehealth: Payer: Self-pay | Admitting: Family Medicine

## 2017-04-10 NOTE — Telephone Encounter (Signed)
No  Result  Note in [facility]  .  Pt   Was  Advised  Per notes  Of  Dr  Etter Sjogren Cheri Rous  On  04/09/2017    Pt  Advised  And willing  To  Start  Metformin   Pharmacy  Of  Choice   Is  [facility] . Pt  Will  Call  Back  To  Schedule  Lab recheck in 3  Months

## 2017-04-11 ENCOUNTER — Ambulatory Visit: Payer: Medicare PPO | Admitting: Family Medicine

## 2017-04-11 DIAGNOSIS — Z1231 Encounter for screening mammogram for malignant neoplasm of breast: Secondary | ICD-10-CM | POA: Diagnosis not present

## 2017-04-11 LAB — HM MAMMOGRAPHY

## 2017-04-11 MED ORDER — METFORMIN HCL ER 500 MG PO TB24: 500.0000 mg | ORAL_TABLET | Freq: Every day | ORAL | 2 refills | Status: DC

## 2017-04-11 NOTE — Addendum Note (Signed)
Addended by: Kem Boroughs D on: 04/11/2017 01:34 PM   Modules accepted: Orders

## 2017-04-11 NOTE — Telephone Encounter (Signed)
Left message on phone that rx has been sent in and that she needs to schedule a follow up appt in 3 months.

## 2017-04-16 ENCOUNTER — Other Ambulatory Visit: Payer: Self-pay

## 2017-04-16 ENCOUNTER — Ambulatory Visit (HOSPITAL_COMMUNITY): Payer: Medicare PPO | Attending: Cardiovascular Disease

## 2017-04-16 DIAGNOSIS — R0609 Other forms of dyspnea: Secondary | ICD-10-CM | POA: Diagnosis not present

## 2017-04-16 DIAGNOSIS — I119 Hypertensive heart disease without heart failure: Secondary | ICD-10-CM | POA: Diagnosis not present

## 2017-04-16 DIAGNOSIS — R079 Chest pain, unspecified: Secondary | ICD-10-CM | POA: Diagnosis not present

## 2017-04-16 DIAGNOSIS — Z86718 Personal history of other venous thrombosis and embolism: Secondary | ICD-10-CM | POA: Diagnosis not present

## 2017-04-16 DIAGNOSIS — I2699 Other pulmonary embolism without acute cor pulmonale: Secondary | ICD-10-CM | POA: Diagnosis not present

## 2017-04-16 DIAGNOSIS — I10 Essential (primary) hypertension: Secondary | ICD-10-CM | POA: Diagnosis not present

## 2017-04-16 DIAGNOSIS — E785 Hyperlipidemia, unspecified: Secondary | ICD-10-CM | POA: Diagnosis not present

## 2017-04-18 ENCOUNTER — Other Ambulatory Visit: Payer: Self-pay | Admitting: *Deleted

## 2017-04-18 DIAGNOSIS — R0609 Other forms of dyspnea: Principal | ICD-10-CM

## 2017-04-18 DIAGNOSIS — I519 Heart disease, unspecified: Secondary | ICD-10-CM

## 2017-05-02 ENCOUNTER — Encounter (INDEPENDENT_AMBULATORY_CARE_PROVIDER_SITE_OTHER): Payer: Self-pay | Admitting: Surgery

## 2017-05-02 ENCOUNTER — Telehealth: Payer: Self-pay | Admitting: Cardiology

## 2017-05-02 ENCOUNTER — Ambulatory Visit (INDEPENDENT_AMBULATORY_CARE_PROVIDER_SITE_OTHER): Payer: Medicare PPO | Admitting: Surgery

## 2017-05-02 ENCOUNTER — Ambulatory Visit (INDEPENDENT_AMBULATORY_CARE_PROVIDER_SITE_OTHER): Payer: Self-pay

## 2017-05-02 VITALS — BP 117/68 | HR 84

## 2017-05-02 DIAGNOSIS — M25571 Pain in right ankle and joints of right foot: Secondary | ICD-10-CM

## 2017-05-02 NOTE — Telephone Encounter (Signed)
Closed Encounter  °

## 2017-05-02 NOTE — Progress Notes (Incomplete)
Office Visit Note   Patient: Sierra Ford           Date of Birth: 04/29/1940           MRN: [id number] Visit Date: 05/02/2017              Requested by: [address] [place], [place] [address] [place],  [place] [address] PCP: Carollee Herter, Alferd Apa, DO   Assessment & Plan: Visit Diagnoses:  1. Pain in right ankle and joints of right foot     Plan:   Follow-Up Instructions: No follow-ups on file.   Orders:  Orders Placed This Encounter  Procedures   XR Ankle Complete Right   No orders of the defined types were placed in this encounter.     Procedures: No procedures performed   Clinical Data: No additional findings.   Subjective: Chief Complaint  Patient presents with   Right Ankle - Edema, Pain    HPI  Review of Systems   Objective: Vital Signs: BP 117/68   Pulse 84   Physical Exam  Ortho Exam  Specialty Comments:  No specialty comments available.  Imaging: No results found.   PMFS History: Patient Active Problem List   Diagnosis Date Noted   Lower abdominal pain 04/04/2021   Rectal bleeding 04/04/2021   Change in bowel habits 04/04/2021   Internal hemorrhoids 04/04/2021   Preventative health care 03/07/2021   Sacroiliitis ([place]) 03/07/2021   Morbid (severe) obesity due to excess calories ([place]) 03/07/2021   Bronchitis 12/20/2020   Other pulmonary embolism with acute cor pulmonale, unspecified chronicity ([place])    Educated about COVID-19 virus infection 01/26/2019   Hyperlipidemia associated with type 2 diabetes mellitus ([place]) 04/24/2018   Right ankle swelling 05/13/2017   Diabetes mellitus, type II ([place]) 04/08/2017   Essential hypertension 04/08/2017   Atherosclerosis of aorta ([place]) 04/08/2017   Abnormal auditory perception of both ears 10/01/2016   Bilateral impacted cerumen 10/01/2016   OSA (obstructive sleep apnea) 12/16/2015   DOE (dyspnea on exertion) 06/12/2013   Obesity (BMI 30-39.9) 04/24/2013   Chest pain,  unspecified 01/22/2011   Abnormal stress test 01/22/2011   Frequent urination 01/22/2011   Chronic diastolic heart failure ([place]) 05/15/2009   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   RASH-NONVESICULAR 04/20/2009   CERVICAL STRAIN, WITH RADICULOPATHY 03/31/2009   CAROTID ARTERY DISEASE 01/12/2009   CERVICAL STRAIN 01/11/2009   LEG EDEMA, RIGHT 07/27/2008   HX, URINARY INFECTION 09/19/2006   Hyperlipidemia LDL goal <70 08/20/2006   PREGNANCY, ECTOPIC NEC W/INTRAUTERINE PRG 08/20/2006   FREQUENCY, URINARY 08/20/2006   Other specified abnormal findings of blood chemistry 08/20/2006   Personal history of venous thrombosis and embolism 08/20/2006   Past Medical History:  Diagnosis Date   Anxiety    Back pain    Chest pain    CLite with apical ischemia in 2006 - normal coronary arteries by [facility] in 12/2004;  Myoview 11/12:  Low risk stress nuclear study with a small, partially reversible apical defect most likely related to apical thinning; cannot R/O very mild apical ischemia.  EF: 75%    Decreased hearing    Depression    Diabetes ([place])    DVT (deep venous thrombosis) ([place]) 2006   hx of   Ectopic pregnancy with intrauterine pregnancy    Hiatal hernia    Hyperlipidemia    PE (pulmonary embolism) 2006   hx of   Sleep apnea    CPAP  Family History  Problem Relation Age of Onset   Heart failure Mother        died from   Colon polyps Mother    Hypertension Mother    Sudden death Mother    Obesity Mother    Alcoholism Father    Diabetes Maternal Aunt        grandaughter   Diabetes Maternal Aunt    Colon cancer Neg Hx    Esophageal cancer Neg Hx    Stomach cancer Neg Hx     Past Surgical History:  Procedure Laterality Date   ABDOMINAL EXPLORATION SURGERY     BUNIONECTOMY     right   COLONOSCOPY  2009   ECTOPIC PREGNANCY SURGERY     FOOT SURGERY     TUBAL LIGATION     Social History   Occupational History   Occupation: Retired Scientist, research (medical)  Tobacco Use    Smoking status: Former    Packs/day: 1.00    Years: 50.00    Total pack years: 50.00    Types: Cigarettes    Quit date: 11/21/2009    Years since quitting: 11.9   Smokeless tobacco: Never  Vaping Use   Vaping Use: Never used  Substance and Sexual Activity   Alcohol use: Yes    Comment: rare 3 times a year per pt   Drug use: No   Sexual activity: Not Currently    Partners: Male

## 2017-05-03 ENCOUNTER — Encounter (INDEPENDENT_AMBULATORY_CARE_PROVIDER_SITE_OTHER): Payer: Self-pay

## 2017-05-03 ENCOUNTER — Ambulatory Visit (INDEPENDENT_AMBULATORY_CARE_PROVIDER_SITE_OTHER): Payer: Medicare PPO

## 2017-05-03 DIAGNOSIS — Z719 Counseling, unspecified: Secondary | ICD-10-CM

## 2017-05-03 NOTE — Progress Notes (Signed)
Patient came in today for diabetic teaching. She was taught how to use her new glucometer, how to record her numbers and report them.  Per Dr. Etter Sjogren, her fasting blood sugars should be between 120-130. Dr. Etter Sjogren wants her to test 2 hours after breakfast, lunch, or dinner. Patient is try to pick a different meal each day at 2 to test after wards. She is also to test if she is not feeling well. Not so good blood sugars for her are 150-180.  Patient was giving written instructions on what to do. She voiced her understanding.        2 hours after 150-180

## 2017-05-07 ENCOUNTER — Encounter (INDEPENDENT_AMBULATORY_CARE_PROVIDER_SITE_OTHER): Payer: Self-pay | Admitting: Orthopaedic Surgery

## 2017-05-07 ENCOUNTER — Ambulatory Visit (INDEPENDENT_AMBULATORY_CARE_PROVIDER_SITE_OTHER): Payer: Medicare PPO | Admitting: Orthopaedic Surgery

## 2017-05-07 ENCOUNTER — Ambulatory Visit (INDEPENDENT_AMBULATORY_CARE_PROVIDER_SITE_OTHER): Payer: PRIVATE HEALTH INSURANCE | Admitting: Orthopaedic Surgery

## 2017-05-07 DIAGNOSIS — M4807 Spinal stenosis, lumbosacral region: Secondary | ICD-10-CM

## 2017-05-07 DIAGNOSIS — M25571 Pain in right ankle and joints of right foot: Secondary | ICD-10-CM | POA: Diagnosis not present

## 2017-05-10 ENCOUNTER — Encounter: Payer: Self-pay | Admitting: Cardiology

## 2017-05-13 ENCOUNTER — Ambulatory Visit: Payer: Self-pay | Admitting: *Deleted

## 2017-05-13 ENCOUNTER — Encounter: Payer: Self-pay | Admitting: Family Medicine

## 2017-05-13 ENCOUNTER — Ambulatory Visit (INDEPENDENT_AMBULATORY_CARE_PROVIDER_SITE_OTHER): Payer: Medicare PPO | Admitting: Family Medicine

## 2017-05-13 ENCOUNTER — Encounter (INDEPENDENT_AMBULATORY_CARE_PROVIDER_SITE_OTHER): Payer: Self-pay | Admitting: Orthopaedic Surgery

## 2017-05-13 VITALS — BP 116/66 | HR 65 | Temp 98.0°F | Resp 16 | Ht 62.0 in | Wt 195.2 lb

## 2017-05-13 DIAGNOSIS — M25471 Effusion, right ankle: Secondary | ICD-10-CM | POA: Insufficient documentation

## 2017-05-13 NOTE — Progress Notes (Signed)
Patient ID: Sierra Ford, female    DOB: [birth year]  Age: 77 y.o. MRN: [id number]    Subjective:  Subjective  HPI Sierra Ford presents for R ankle pain.  She sprained it over a week ago and put ice directly on her skin for 20 min --- it turned white ---  It did go away but left some redness .     Review of Systems  Constitutional: Positive for fatigue. Negative for activity change, appetite change and unexpected weight change.  Respiratory: Negative for cough and shortness of breath.   Cardiovascular: Negative for chest pain and palpitations.  Musculoskeletal: Positive for arthralgias and joint swelling.  Psychiatric/Behavioral: Negative for behavioral problems and dysphoric mood. The patient is not nervous/anxious.     History Past Medical History:  Diagnosis Date  . Chest pain    CLite with apical ischemia in 2006 - normal coronary arteries by [facility] in 12/2004;  Myoview 11/12:  Low risk stress nuclear study with a small, partially reversible apical defect most likely related to apical thinning; cannot R/O very mild apical ischemia.  EF: 75%   . DVT (deep venous thrombosis) ([place]) 2006   hx of  . Ectopic pregnancy with intrauterine pregnancy   . Hiatal hernia   . Hyperlipidemia   . PE (pulmonary embolism) 2006   hx of  . UTI (lower urinary tract infection)    hx    She has a past surgical history that includes Foot surgery; Abdominal exploration surgery; Ectopic pregnancy surgery; and Bunionectomy.   Her family history includes Colon polyps in her mother; Diabetes in her maternal aunt and maternal aunt; Heart failure in her mother.She reports that she quit smoking about 7 years ago. Her smoking use included cigarettes. She has a 50.00 pack-year smoking history. She has never used smokeless tobacco. She reports that she does not drink alcohol or use drugs.  Current Outpatient Medications on File Prior to Visit  Medication Sig Dispense Refill  . aspirin EC 81 MG tablet Take  81 mg by mouth at bedtime.      . blood glucose meter kit and supplies KIT Dispense based on patient and insurance preference. Use up to four times daily as directed. (FOR ICD-9 250.00, 250.01). 1 each 0  . CALCIUM-VITAMIN D PO Take 600 Units by mouth 2 (two) times daily.      . cholecalciferol (VITAMIN D-400) 400 UNITS TABS Take 400 Units by mouth 2 (two) times daily.     . Coenzyme Q10 100 MG TABS Take 100 mg by mouth daily.    Marland Kitchen ezetimibe (ZETIA) 10 MG tablet TAKE 1 TABLET BY MOUTH ONCE DAILY 90 tablet 2  . Flaxseed, Linseed, (FLAXSEED OIL PO) Take by mouth.    . furosemide (LASIX) 40 MG tablet TAKE ONE TABLET BY MOUTH ONCE DAILY 90 tablet 3  . meloxicam (MOBIC) 15 MG tablet Take 1 tablet (15 mg total) by mouth daily. 30 tablet 2  . metFORMIN (GLUCOPHAGE-XR) 500 MG 24 hr tablet Take 1 tablet (500 mg total) by mouth daily with breakfast. 30 tablet 2  . Multiple Vitamin (MULTIVITAMIN PO) Take 1 tablet by mouth at bedtime.     . Omega-3 Fatty Acids (FISH OIL PO) Take by mouth.    . Potassium (POTASSIMIN PO) Take 1 tablet by mouth as directed.    . rosuvastatin (CRESTOR) 20 MG tablet Take 0.5 tablets (10 mg total) by mouth daily. 45 tablet 0  . [DISCONTINUED] omega-3 acid ethyl esters (LOVAZA)  1 G capsule Take 1 g by mouth 3 (three) times daily.      . [DISCONTINUED] pravastatin (PRAVACHOL) 20 MG tablet Take 20 mg by mouth daily.       No current facility-administered medications on file prior to visit.      Objective:  Objective  Physical Exam  Constitutional: She is oriented to person, place, and time. She appears well-developed and well-nourished.  HENT:  Head: Normocephalic and atraumatic.  Eyes: Conjunctivae and EOM are normal.  Neck: Normal range of motion. Neck supple. No JVD present. Carotid bruit is not present. No thyromegaly present.  Cardiovascular: Normal rate, regular rhythm and normal heart sounds.  No murmur heard. Pulmonary/Chest: Effort normal and breath sounds normal.  No respiratory distress. She has no wheezes. She has no rales. She exhibits no tenderness.  Musculoskeletal: She exhibits no edema.       Right ankle: She exhibits swelling and ecchymosis. Tenderness. Lateral malleolus tenderness found.  Neurological: She is alert and oriented to person, place, and time. Coordination normal.  Psychiatric: She has a normal mood and affect.  Nursing note and vitals reviewed.  BP 116/66 (BP Location: Right Arm, Cuff Size: Normal)   Pulse 65   Temp 98 F (36.7 C) (Oral)   Resp 16   Ht '5\' 2"'  (1.575 m)   Wt 195 lb 3.2 oz (88.5 kg)   SpO2 95%   BMI 35.70 kg/m  Wt Readings from Last 3 Encounters:  05/13/17 195 lb 3.2 oz (88.5 kg)  05/07/17 192 lb (87.1 kg)  04/08/17 198 lb 6.4 oz (90 kg)     Lab Results  Component Value Date   WBC 4.1 12/28/2016   HGB 14.5 12/28/2016   HCT 42 12/28/2016   PLT 241 12/28/2016   GLUCOSE 123 (H) 04/08/2017   CHOL 196 04/08/2017   TRIG 59.0 04/08/2017   HDL 90.60 04/08/2017   LDLDIRECT 95.9 10/06/2012   LDLCALC 93 04/08/2017   ALT 25 04/08/2017   AST 22 04/08/2017   NA 139 04/08/2017   K 4.4 04/08/2017   CL 104 04/08/2017   CREATININE 0.81 04/08/2017   BUN 16 04/08/2017   CO2 28 04/08/2017   TSH 1.27 12/28/2016   INR 1.03 04/20/2009   HGBA1C 6.8 (H) 04/08/2017   MICROALBUR 1.1 04/08/2017    Dg Chest 2 View  Result Date: 04/08/2017 CLINICAL DATA:  Shortness of Breath EXAM: CHEST  2 VIEW COMPARISON:  October 25, 2015 FINDINGS: Scarring in the right upper lobe is stable. There is no edema or consolidation. Heart is upper normal in size with pulmonary vascularity within normal limits. There is aortic atherosclerosis. There is degenerative change in the thoracic spine. IMPRESSION: Stable scarring right upper lobe. No edema or consolidation. Stable cardiac silhouette. There is aortic atherosclerosis. Aortic Atherosclerosis (ICD10-I70.0). Electronically Signed   By: Lowella Grip III M.D.   On: 04/08/2017 10:03       Assessment & Plan:  Plan  I am having Symphoni B. Swartzendruber maintain her cholecalciferol, CALCIUM-VITAMIN D PO, Multiple Vitamin (MULTIVITAMIN PO), aspirin EC, rosuvastatin, Potassium (POTASSIMIN PO), Coenzyme Q10, (Flaxseed, Linseed, (FLAXSEED OIL PO)), Omega-3 Fatty Acids (FISH OIL PO), furosemide, meloxicam, ezetimibe, blood glucose meter kit and supplies, and metFORMIN.  No orders of the defined types were placed in this encounter.   Problem List Items Addressed This Visit      Unprioritized   Right ankle swelling - Primary    con't with brace per ortho F/u ortho  Elevated  ice alt heat-- -make sure you have towel between skin and ice --- no more 15-20  min         Follow-up: Return in about 5 months (around 10/13/2017), or if symptoms worsen or fail to improve, for annual exam, fasting, and AWV with rn.  Ann Held, DO

## 2017-05-13 NOTE — Progress Notes (Signed)
Office Visit Note   Patient: Sierra Ford           Date of Birth: August 08, 1940           MRN: [id number] Visit Date: 05/07/2017              Requested by: [address], [place], [place] [address] [place], [place] [address] PCP: Carollee Herter, Alferd Apa, DO   Assessment & Plan: Visit Diagnoses:  1. Spinal stenosis of lumbosacral region   2.  S/P lateral right ankle sprain  Plan: We will place patient in a lace up Swede-O that she can use.  Number symptoms has anterolisthesis at L3-4 with claudication symptoms.  Follow-Up Instructions: Follow-up after lumbar MRI to evaluate her for lumbar spinal stenosis.  Orders:  Orders Placed This Encounter  Procedures  . MR Lumbar Spine w/o contrast   No orders of the defined types were placed in this encounter.     Procedures: No procedures performed   Clinical Data: No additional findings.   Subjective: Chief Complaint  Patient presents with  . Lower Back - Pain, Follow-up  . Right Ankle - Pain    HPI 77 year old female returns for follow-up of right ankle injury and persistent problems with low back pain.  Is placed in a cam boot and states she cannot wear the cam boot since it bothers her back with ambulation.  Tolerate 10 minutes and then has increased back pain.  She has pain with standing increased back and leg symptoms that make her have to stop and sit down to get relief.  Negative for fever or chills.  He denies bowel or bladder associated symptoms.  Review of Systems 14 point review of systems updated and unchanged from 03/13/2017 other than as mentioned above.  Neurogenic claudication after standing 10 minutes he can ambulate 1-2 blocks and has to stop and sit.  History of diastolic heart failure carotid artery disease noted.  Peripheral edema she is using teds.   Objective: Vital Signs: BP 110/66   Pulse 71   Ht 5\' 2"  (1.575 m)   Wt 192 lb (87.1 kg)   BMI 35.12 kg/m   Physical Exam  Constitutional:  She is oriented to person, place, and time. She appears well-developed.  HENT:  Head: Normocephalic.  Right Ear: External ear normal.  Left Ear: External ear normal.  Eyes: Pupils are equal, round, and reactive to light.  Neck: No tracheal deviation present. No thyromegaly present.  Cardiovascular: Normal rate.  Pulmonary/Chest: Effort normal.  Abdominal: Soft.  Neurological: She is alert and oriented to person, place, and time.  Skin: Skin is warm and dry.  Psychiatric: She has a normal mood and affect. Her behavior is normal.    Ortho Exam patient has some discomfort with internal rotation left greater than right hip consistent with hip osteoarthritis.  Pedal pulses are intact.  Knees reach full extension.  Slight tenderness anterolateral right ankle.  1+ anterior drawer with good endpoint.  Resisted peroneal function anterior tib gastrocsoleus is intact and symmetrical.  Specialty Comments:  No specialty comments available.  Imaging: No results found.   PMFS History: Patient Active Problem List   Diagnosis Date Noted  . DM (diabetes mellitus) type II uncontrolled, periph vascular disorder ([place]) 04/08/2017  . Essential hypertension 04/08/2017  . Atherosclerosis of aorta ([place]) 04/08/2017  . OSA (obstructive sleep apnea) 12/16/2015  . DOE (dyspnea on exertion) 06/12/2013  . Obesity (BMI 30-39.9) 04/24/2013  .  Chest pain, unspecified 01/22/2011  . Abnormal stress test 01/22/2011  . Frequent urination 01/22/2011  . Chronic diastolic heart failure ([place]) 05/15/2009  . 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  . RASH-NONVESICULAR 04/20/2009  . CERVICAL STRAIN, WITH RADICULOPATHY 03/31/2009  . CAROTID ARTERY DISEASE 01/12/2009  . CERVICAL STRAIN 01/11/2009  . LEG EDEMA, RIGHT 07/27/2008  . HX, URINARY INFECTION 09/19/2006  . Hyperlipidemia 08/20/2006  . PREGNANCY, ECTOPIC NEC W/INTRAUTERINE PRG 08/20/2006  . FREQUENCY, URINARY 08/20/2006  . Other specified abnormal  findings of blood chemistry 08/20/2006  . Personal history of venous thrombosis and embolism 08/20/2006   Past Medical History:  Diagnosis Date  . Chest pain    CLite with apical ischemia in 2006 - normal coronary arteries by [facility] in 12/2004;  Myoview 11/12:  Low risk stress nuclear study with a small, partially reversible apical defect most likely related to apical thinning; cannot R/O very mild apical ischemia.  EF: 75%   . DVT (deep venous thrombosis) ([place]) 2006   hx of  . Ectopic pregnancy with intrauterine pregnancy   . Hiatal hernia   . Hyperlipidemia   . PE (pulmonary embolism) 2006   hx of  . UTI (lower urinary tract infection)    hx    Family History  Problem Relation Age of Onset  . Heart failure Mother        died from  . Colon polyps Mother   . Diabetes Maternal Museum/gallery exhibitions officer  . Diabetes Maternal Aunt     Past Surgical History:  Procedure Laterality Date  . ABDOMINAL EXPLORATION SURGERY    . BUNIONECTOMY     right  . ECTOPIC PREGNANCY SURGERY    . FOOT SURGERY     Social History   Occupational History  . Occupation: [employer]  Tobacco Use  . Smoking status: Former Smoker    Packs/day: 1.00    Years: 50.00    Pack years: 50.00    Types: Cigarettes    Last attempt to quit: 11/21/2009    Years since quitting: 7.4  . Smokeless tobacco: Never Used  Substance and Sexual Activity  . Alcohol use: No  . Drug use: No  . Sexual activity: Not Currently    Partners: Male

## 2017-05-13 NOTE — Telephone Encounter (Signed)
Pt called with complaints of area on her right ankle may have frost bite which occurred  on 05/11/17; she states that she sprained her ankle a week ago and placed ice directly on the site for 20 minutes; initially the area was white on Friday; about 30 minutes later it became red and painful; nurse triage initiated; recommendations made per protocol to include seeing a physician within 24 hours; pt normally sees Dr Etter Sjogren; pt offered and accepted appointment with Dr Etter Sjogren, [facility], today at 1430; will route to office for notification of this encounter.      Reason for Disposition . [1] Frostbite blisters AND [2] contain clear fluid  Answer Assessment - Initial Assessment Questions 1. DESCRIPTION: "How does it feel?" "What does it look like?" "Are there any blisters"?     Right ankle red 2. LOCATION: "Where is the frostbite located?"     Right ankle 3. SIZE: "How large an area is involved?"     50 cent piece 4. ONSET: "When did it happen?"     05/10/17 5. HYPOTHERMIA: "Are you shivering?"     no 6. CAUSE: "What do you think caused the frostbite?"     Ice applied directly to sit4 7. OTHER SYMPTOMS: "Do you have any other symptoms?" (e.g., weakness, difficulty walking, confusion)     pain 8. PREGNANCY: "Is there any chance you are pregnant?" "When was your last menstrual period?"     no  Protocols used: [facility]

## 2017-05-13 NOTE — Telephone Encounter (Signed)
FYI to PCP

## 2017-05-13 NOTE — Patient Instructions (Signed)
Ankle Sprain  An ankle sprain is a stretch or tear in one of the tough tissues (ligaments) in your ankle.  Follow these instructions at home:   Rest your ankle.   Take over-the-counter and prescription medicines only as told by your doctor.   For 2-3 days, keep your ankle higher than the level of your heart (elevated) as much as possible.   If directed, put ice on the area:  ? Put ice in a plastic bag.  ? Place a towel between your skin and the bag.  ? Leave the ice on for 20 minutes, 2-3 times a day.   If you were given a brace:  ? Wear it as told.  ? Take it off to shower or bathe.  ? Try not to move your ankle much, but wiggle your toes from time to time. This helps to prevent swelling.   If you were given an elastic bandage (dressing):  ? Take it off when you shower or bathe.  ? Try not to move your ankle much, but wiggle your toes from time to time. This helps to prevent swelling.  ? Adjust the bandage to make it more comfortable if it feels too tight.  ? Loosen the bandage if you lose feeling in your foot, your foot tingles, or your foot gets cold and blue.   If you have crutches, use them as told by your doctor. Continue to use them until you can walk without feeling pain in your ankle.  Contact a doctor if:   Your bruises or swelling are quickly getting worse.   Your pain does not get better after you take medicine.  Get help right away if:   You cannot feel your toes or foot.   Your toes or your foot looks blue.   You have very bad pain that gets worse.  This information is not intended to replace advice given to you by your health care provider. Make sure you discuss any questions you have with your health care provider.  Document Released: 07/25/2007 Document Revised: 07/14/2015 Document Reviewed: 09/07/2014  Elsevier Interactive Patient Education  2018 Elsevier Inc.

## 2017-05-13 NOTE — Assessment & Plan Note (Signed)
con't with brace per ortho F/u ortho  Elevated ice alt heat-- -make sure you have towel between skin and ice --- no more 15-20  min

## 2017-05-14 ENCOUNTER — Other Ambulatory Visit: Payer: Medicare PPO | Admitting: *Deleted

## 2017-05-14 DIAGNOSIS — E782 Mixed hyperlipidemia: Secondary | ICD-10-CM

## 2017-05-14 LAB — HEPATIC FUNCTION PANEL
ALBUMIN: 4.3 g/dL (ref 3.5–4.8)
ALT: 19 IU/L (ref 0–32)
AST: 19 IU/L (ref 0–40)
Alkaline Phosphatase: 78 IU/L (ref 39–117)
BILIRUBIN TOTAL: 1 mg/dL (ref 0.0–1.2)
BILIRUBIN, DIRECT: 0.25 mg/dL (ref 0.00–0.40)
TOTAL PROTEIN: 6.6 g/dL (ref 6.0–8.5)

## 2017-05-14 LAB — LIPID PANEL
CHOL/HDL RATIO: 2.1 ratio (ref 0.0–4.4)
Cholesterol, Total: 195 mg/dL (ref 100–199)
HDL: 95 mg/dL (ref >39)
LDL Calculated: 88 mg/dL (ref 0–99)
Triglycerides: 58 mg/dL (ref 0–149)
VLDL Cholesterol Cal: 12 mg/dL (ref 5–40)

## 2017-05-15 ENCOUNTER — Ambulatory Visit (INDEPENDENT_AMBULATORY_CARE_PROVIDER_SITE_OTHER): Payer: Medicare PPO | Admitting: Pharmacist

## 2017-05-15 DIAGNOSIS — E782 Mixed hyperlipidemia: Secondary | ICD-10-CM | POA: Diagnosis not present

## 2017-05-15 MED ORDER — ROSUVASTATIN CALCIUM 20 MG PO TABS: ORAL_TABLET | ORAL | 3 refills | Status: DC

## 2017-05-15 NOTE — Patient Instructions (Addendum)
Increase your Crestor (rosuvastatin) to 1/2 tablet daily except 1 tablet on Mondays, Wednesdays, and Fridays.  Continue to take your Zetia each day.  Keep up the great work exercising. Try to look for low fat ice cream.  Follow up lab work in 6 months and office visit to discuss your results the next day.  Call Alizandra Loh, Pharmacist with any side effects prior to this [phone].

## 2017-05-15 NOTE — Progress Notes (Signed)
Patient ID: Sierra Ford                 DOB: Oct 18, 1940                    MRN: [id number]     HPI: Sierra Ford is a 77 y.o. female patient of Dr. Percival Spanish that presents today for lipid follow up. PMH is significant for DM, mild-moderate carotid plaque on ultrasound, HLD, DVT and PE (2006), and OSA. She is a longstanding patient of ours, with a history of statin intolerance with atorvastatin, pravastatin, rosuvastatin 5m plusLovaza, but did not tolerate these due to myalgias. Currently taking Crestor 139mdaily and Zetia 1013maily and tolerating them well. Pt prefers to follow up every 6 months with lipid panel and office visit; this helps with accountability for lifestyle improvements. Pt has a history of statin intolerance with atorvastatin, pravastatin, rosuvastatin 62m54musLovaza, but did not tolerate these due to myalgias. Most recent lipid panel revealed LDL of 88.  Patient presents today in good spirits. She has decreased her intake of Cookout milkshakes and Ben and Jerry's. She has switched to lower fat ice cream from [employer] has been eating fish and chicken, no red meat or white carbs. She uses Splenda to sweeten food. She continues to tolerate her Crestor and Zetia well. She was recently started on metformin due to A1c of 6.8. Pt tries to stay very active with water aerobics and Silver Sneakers exercise programs most days a week. She did have an infected toenail in January that put her out from water aerobics for a month. She currently has a sprained right ankle which has been affecting her exercise as well. She is scheduled for an MRI due to back pain.  Current Medications: rosuvastatin 10mg39mly - max tolerated dose, ezetimibe 10mg 8my Intolerances: rosuvastatin 62mg -43mcle aches, atorvastatin and pravastatin - muscle aches, Lovaza - muscle aches Risk Factors: CAD - mild-moderate carotid plaque on ultrasound LDL goal: <70mg/dL19m to carotid plaque  Diet: Has been  trying to eat more chicken - does fry it occasionally. Eats a lot of spinach, kale and collard greens. Tries to limit her red meat. Does eat Ben and Suezanne Jacquetry's frequently, at least 4 days a week. Encourage substituting with Halo Top. Avoids cooking carbohydrates, but will eat rice/pasta/potatoes when she goes out to eat.  Usually drinks water with flavor packets and an occasional diet soda. Dr. Lowne ChCarollee Herterged heart healthy diet, avoid trans fats, minimize simple carbs and saturated fats at OV on 8/Mango/18.  Exercise: Pt exercises with Silver Sneakers three times a week and does water aerobics 5x/week.  Family History: Mother died from HF, mother had colon polyps and Alzheimer's disease, aunt and granddaughter has diabetes.  Social History: Former smoker: 1ppd x 50 years. Quit date: 11/21/2009. Minimal alcohol use, no drug use.  Labs: 05/14/17: TC 195, TG 58, HDL 95, LDL 88 (Crestor 10mg dai68mZetia 10mg dail74m/25/18: TC 197, TG 55, HDL 112, LDL 74 (Crestor 10mg daily35mtia 10mg daily)63m/18: TC 200, TG 62, HDL 101, LDL 87 (Crestor 10mg daily, 36ma 10mg daily) 362m18: TC 217, TG 76, HDL 115, LDL 87 (Crestor 10mg daily, Ze46m10mg daily) 9/262m TC 194, TG 97, HDL 106, LDL 69 (Crestor 10mg daily, Zeti67mmg daily) 3/20121mC 232, TG 70, HDL 127, LDL 91, LFTs wnl (Crestor 10mg daily) 9/201660m 262, TG 93, HDL 105.4, LDL 138, LFTs wnl (Crestor 10mg daily) -56m  admits to many dietary indiscretions this summer 05/2014: TC 184, TG 75, HDL 82.7, LDL 86, LFTs wnl (Crestor 3m daily)  Past Medical History:  Diagnosis Date  . Chest pain    [facility] with apical ischemia in 2006 [facility];  Myoview 11/12:  Low risk stress nuclear study with a small, partially reversible apical defect most likely related to apical thinning; cannot R/O very mild apical ischemia.  EF: 75%   . DVT (deep venous thrombosis) ([place] 2006   hx of  . Ectopic pregnancy with intrauterine pregnancy    . Hiatal hernia   . Hyperlipidemia   . PE (pulmonary embolism) 2006   hx of  . UTI (lower urinary tract infection)    hx    Current Outpatient Medications on File Prior to Visit  Medication Sig Dispense Refill  . aspirin EC 81 MG tablet Take 81 mg by mouth at bedtime.      . blood glucose meter kit and supplies KIT Dispense based on patient and insurance preference. Use up to four times daily as directed. (FOR ICD-9 250.00, 250.01). 1 each 0  . CALCIUM-VITAMIN D PO Take 600 Units by mouth 2 (two) times daily.      . cholecalciferol (VITAMIN D-400) 400 UNITS TABS Take 400 Units by mouth 2 (two) times daily.     . Coenzyme Q10 100 MG TABS Take 100 mg by mouth daily.    .Marland Kitchenezetimibe (ZETIA) 10 MG tablet TAKE 1 TABLET BY MOUTH ONCE DAILY 90 tablet 2  . Flaxseed, Linseed, (FLAXSEED OIL PO) Take by mouth.    . furosemide (LASIX) 40 MG tablet TAKE ONE TABLET BY MOUTH ONCE DAILY 90 tablet 3  . meloxicam (MOBIC) 15 MG tablet Take 1 tablet (15 mg total) by mouth daily. 30 tablet 2  . metFORMIN (GLUCOPHAGE-XR) 500 MG 24 hr tablet Take 1 tablet (500 mg total) by mouth daily with breakfast. 30 tablet 2  . Multiple Vitamin (MULTIVITAMIN PO) Take 1 tablet by mouth at bedtime.     . Omega-3 Fatty Acids (FISH OIL PO) Take by mouth.    . Potassium (POTASSIMIN PO) Take 1 tablet by mouth as directed.    . rosuvastatin (CRESTOR) 20 MG tablet Take 0.5 tablets (10 mg total) by mouth daily. 45 tablet 0  . [DISCONTINUED] omega-3 acid ethyl esters (LOVAZA) 1 G capsule Take 1 g by mouth 3 (three) times daily.      . [DISCONTINUED] pravastatin (PRAVACHOL) 20 MG tablet Take 20 mg by mouth daily.       No current facility-administered medications on file prior to visit.     No Known Allergies  Assessment/Plan:  1. Hyperlipidemia: Pt continues to make healthy lifestyle choices with frequent exercise and healthy eating habits. Discussed reading nutrition labels when she shops for ice cream to look for those with  lower saturated fat and sugar content. Pt is tolerating Crestor 150mdaily and Zetia 10108maily however is previously intolerant to Crestor 36m29mily. LDL currently 88 close to goal < 70. Will increase Crestor to 36mg56m and continue 10mg 50mother days. Recheck lipids in 6 months with pharmacy visit after per patient preference. Advised pt to call clinic if she does not tolerate higher Crestor dosing.   Megan E. Supple, PharmD, CPP, BCACP [id number]: [phone] 7:24 AM

## 2017-05-19 NOTE — Progress Notes (Signed)
 HPI The patient presents for evaluation of dyspnea.  She has had some mild diastolic dysfunction and abnormal echo.  I saw her last year and she returns for follow up.  Since I last saw her she has done well.  She does water aerobics and does well with this.  The patient denies any new symptoms such as chest discomfort, neck or arm discomfort. There has been no new shortness of breath, PND or orthopnea. There have been no reported palpitations, presyncope or syncope.    No Known Allergies  Current Outpatient Medications  Medication Sig Dispense Refill  . aspirin EC 81 MG tablet Take 81 mg by mouth at bedtime.      . blood glucose meter kit and supplies KIT Dispense based on patient and insurance preference. Use up to four times daily as directed. (FOR ICD-9 250.00, 250.01). 1 each 0  . CALCIUM-VITAMIN D PO Take 600 Units by mouth 2 (two) times daily.      . cholecalciferol (VITAMIN D-400) 400 UNITS TABS Take 400 Units by mouth 2 (two) times daily.     . Coenzyme Q10 100 MG TABS Take 100 mg by mouth daily.    . ezetimibe (ZETIA) 10 MG tablet TAKE 1 TABLET BY MOUTH ONCE DAILY 90 tablet 2  . Flaxseed, Linseed, (FLAXSEED OIL PO) Take by mouth.    . furosemide (LASIX) 40 MG tablet TAKE ONE TABLET BY MOUTH ONCE DAILY 90 tablet 3  . metFORMIN (GLUCOPHAGE-XR) 500 MG 24 hr tablet Take 1 tablet (500 mg total) by mouth daily with breakfast. 30 tablet 2  . Multiple Vitamin (MULTIVITAMIN PO) Take 1 tablet by mouth at bedtime.     . Omega-3 Fatty Acids (FISH OIL PO) Take by mouth.    . Potassium (POTASSIMIN PO) Take 1 tablet by mouth as directed.    . rosuvastatin (CRESTOR) 10 MG tablet Take 10 mg by mouth daily.     No current facility-administered medications for this visit.     Past Medical History:  Diagnosis Date  . Chest pain    CLite with apical ischemia in 2006 - normal coronary arteries by [facility] in 12/2004;  Myoview 11/12:  Low risk stress nuclear study with a small, partially reversible  apical defect most likely related to apical thinning; cannot R/O very mild apical ischemia.  EF: 75%   . DVT (deep venous thrombosis) (HCC) 2006   hx of  . Ectopic pregnancy with intrauterine pregnancy   . Hiatal hernia   . Hyperlipidemia   . PE (pulmonary embolism) 2006   hx of  . UTI (lower urinary tract infection)    hx    Past Surgical History:  Procedure Laterality Date  . ABDOMINAL EXPLORATION SURGERY    . BUNIONECTOMY     right  . ECTOPIC PREGNANCY SURGERY    . FOOT SURGERY      ROS:   Tingling in her face, right ankle swelling after she twisted it, low back pain.  Otherwise as stated in the HPI and negative for all other systems.  PHYSICAL EXAM BP 112/78   Pulse 67   Ht 5' 2" (1.575 m)   Wt 194 lb 9.6 oz (88.3 kg)   BMI 35.59 kg/m   GENERAL:  Well appearing NECK:  No jugular venous distention, waveform within normal limits, carotid upstroke brisk and symmetric, no bruits, no thyromegaly LUNGS:  Clear to auscultation bilaterally CHEST:  Unremarkable HEART:  PMI not displaced or sustained,S1 and S2 within normal   limits, no S3, no S4, no clicks, no rubs, no murmurs ABD:  Flat, positive bowel sounds normal in frequency in pitch, no bruits, no rebound, no guarding, no midline pulsatile mass, no hepatomegaly, no splenomegaly EXT:  2 plus pulses throughout, no edema, no cyanosis no clubbing    EKG:  Sinus rhythm, rate 67  left axis deviation, poor anterior R wave progression, minimal voltage criteria for left ventricular hypertrophy, no acute ST-T wave changes.  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  She is euvolemic.  No change in therapy.  EDEMA:  This has been mild.   She does not have edema at this point.  No change in therapy.  DM:   A1c was 6.7.  No change in therapy.  SLEEP APNEA:   She is wearing CPAP.  No change in therapy.    DYSLIPIDEMIA:  She is being followed in the Lipid Clinic.  At this point I do not think that she needs to have her Crestor increased as was suggested because her HDL is still very high her ratio is excellent.  She will remain on the 10 mg daily.  She will remain on the Zetia.   

## 2017-05-21 ENCOUNTER — Encounter: Payer: Self-pay | Admitting: Cardiology

## 2017-05-21 ENCOUNTER — Ambulatory Visit (INDEPENDENT_AMBULATORY_CARE_PROVIDER_SITE_OTHER): Payer: Medicare PPO | Admitting: Cardiology

## 2017-05-21 VITALS — BP 112/78 | HR 67 | Ht 62.0 in | Wt 194.6 lb

## 2017-05-21 DIAGNOSIS — I10 Essential (primary) hypertension: Secondary | ICD-10-CM

## 2017-05-21 DIAGNOSIS — I5032 Chronic diastolic (congestive) heart failure: Secondary | ICD-10-CM

## 2017-05-21 NOTE — Patient Instructions (Signed)
Medication Instructions:  Take- Crestor 10 mg daily  If you need a refill on your cardiac medications before your next appointment, please call your pharmacy.  Labwork: None Ordered   Testing/Procedures: None Ordered  Follow-Up: Your physician wants you to follow-up in: 1 Year. You should receive a reminder letter in the mail two months in advance. If you do not receive a letter, please call our office [phone].    Thank you for choosing [facility]!!

## 2017-05-23 ENCOUNTER — Ambulatory Visit: Payer: Medicare PPO | Admitting: Cardiology

## 2017-05-23 ENCOUNTER — Ambulatory Visit
Admission: RE | Admit: 2017-05-23 | Discharge: 2017-05-23 | Disposition: A | Discharge status: Home / Self Care | Payer: PRIVATE HEALTH INSURANCE | Source: Ambulatory Visit | Attending: Orthopaedic Surgery | Admitting: Orthopaedic Surgery

## 2017-05-23 ENCOUNTER — Encounter (INDEPENDENT_AMBULATORY_CARE_PROVIDER_SITE_OTHER): Payer: Medicare PPO

## 2017-05-23 DIAGNOSIS — M4807 Spinal stenosis, lumbosacral region: Secondary | ICD-10-CM

## 2017-05-23 DIAGNOSIS — M48061 Spinal stenosis, lumbar region without neurogenic claudication: Secondary | ICD-10-CM | POA: Diagnosis not present

## 2017-05-27 ENCOUNTER — Ambulatory Visit (INDEPENDENT_AMBULATORY_CARE_PROVIDER_SITE_OTHER): Payer: Medicare PPO | Admitting: Family Medicine

## 2017-05-27 ENCOUNTER — Encounter (INDEPENDENT_AMBULATORY_CARE_PROVIDER_SITE_OTHER): Payer: Self-pay | Admitting: Family Medicine

## 2017-05-27 VITALS — BP 108/70 | HR 53 | Temp 97.8°F | Ht 63.0 in | Wt 193.0 lb

## 2017-05-27 DIAGNOSIS — E669 Obesity, unspecified: Secondary | ICD-10-CM | POA: Diagnosis not present

## 2017-05-27 DIAGNOSIS — Z1331 Encounter for screening for depression: Secondary | ICD-10-CM | POA: Diagnosis not present

## 2017-05-27 DIAGNOSIS — Z0289 Encounter for other administrative examinations: Secondary | ICD-10-CM

## 2017-05-27 DIAGNOSIS — E1169 Type 2 diabetes mellitus with other specified complication: Secondary | ICD-10-CM | POA: Diagnosis not present

## 2017-05-27 DIAGNOSIS — Z6834 Body mass index (BMI) 34.0-34.9, adult: Secondary | ICD-10-CM

## 2017-05-27 DIAGNOSIS — R0602 Shortness of breath: Secondary | ICD-10-CM

## 2017-05-27 DIAGNOSIS — E559 Vitamin D deficiency, unspecified: Secondary | ICD-10-CM | POA: Diagnosis not present

## 2017-05-27 DIAGNOSIS — R5383 Other fatigue: Secondary | ICD-10-CM | POA: Diagnosis not present

## 2017-05-27 NOTE — Progress Notes (Signed)
Office: [phone]  /  Fax: [phone]   Dear Dr. Carollee Herter,   Thank you for referring Sierra Ford to our clinic. The following note includes my evaluation and treatment recommendations.  HPI:   Chief Complaint: OBESITY    Sierra Ford has been referred by Ann Held, DO for consultation regarding her obesity and obesity related comorbidities.    Sierra Ford (MR# [id number]) is a 77 y.o. female who presents on 05/27/2017 for obesity evaluation and treatment. Current BMI is Body mass index is 34.19 kg/m.Marland Kitchen Sierra Ford has been struggling with her weight for many years and has been unsuccessful in either losing weight, maintaining weight loss, or reaching her healthy weight goal.     Sierra Ford attended our information session and states she is currently in the action stage of change and ready to dedicate time achieving and maintaining a healthier weight. Sierra Ford is interested in becoming our patient and working on intensive lifestyle modifications including (but not limited to) diet, exercise and weight loss.    Sierra Ford states her desired weight loss is 23-28 lbs she started gaining weight in 2010 her heaviest weight ever was 195 lbs she has significant food cravings issues  she snacks frequently in the evenings she skips meals frequently she is frequently drinking liquids with calories she frequently makes poor food choices she frequently eats larger portions than normal  she struggles with emotional eating    Fatigue Sierra Ford feels her energy is lower than it should be. This has worsened with weight gain and has not worsened recently. Sierra Ford admits to daytime somnolence and  denies waking up still tired. Patient is at risk for obstructive sleep apnea. Patent has a history of symptoms of daytime fatigue. Patient generally gets 4 hours of sleep per night, and states they generally have generally restful sleep. Snoring is present. Apneic episodes are not present. Epworth  Sleepiness Score is 14.  Dyspnea on exertion Sierra Ford notes increasing shortness of breath with exercising and seems to be worsening over time with weight gain. She notes getting out of breath sooner with activity than she used to. This has not gotten worse recently. EKG done on 05/21/17, left ventricular hypertrophy, poor R wave progression (done at Cardiologist office). Sierra Ford denies orthopnea.  Diabetes II Sierra Ford has a diagnosis of diabetes type II. Sierra Ford's last Hgb A1c of 6.8 in Feburary 2019, urine microalbumin and creatinine normal. She is on metformin currently and she denies any hypoglycemic episodes. She has been working on intensive lifestyle modifications including diet, exercise, and weight loss to help control her blood glucose levels.  Vitamin D Deficiency Sierra Ford has a diagnosis of vitamin D deficiency. She is on OTC Vit D currently, no Vit D level in chart. She denies nausea, vomiting or muscle weakness.  Depression Screen Sierra Ford's Food and Mood (modified PHQ-9) score was  Depression screen PHQ 2/9 05/27/2017  Decreased Interest 1  Down, Depressed, Hopeless 1  PHQ - 2 Score 2  Altered sleeping 1  Tired, decreased energy 2  Change in appetite 1  Feeling bad or failure about yourself  3  Trouble concentrating 3  Moving slowly or fidgety/restless 3  Suicidal thoughts 0  PHQ-9 Score 15  Difficult doing work/chores Somewhat difficult    ALLERGIES: No Known Allergies  MEDICATIONS: Current Outpatient Medications on File Prior to Visit  Medication Sig Dispense Refill  . aspirin EC 81 MG tablet Take 81 mg by mouth at bedtime.      . blood glucose meter  kit and supplies KIT Dispense based on patient and insurance preference. Use up to four times daily as directed. (FOR ICD-9 250.00, 250.01). 1 each 0  . CALCIUM-VITAMIN D PO Take 600 Units by mouth 2 (two) times daily.      . cholecalciferol (VITAMIN D-400) 400 UNITS TABS Take 400 Units by mouth 2 (two) times daily.     . Coenzyme Q10  100 MG TABS Take 100 mg by mouth daily.    Marland Kitchen ezetimibe (ZETIA) 10 MG tablet TAKE 1 TABLET BY MOUTH ONCE DAILY 90 tablet 2  . Flaxseed, Linseed, (FLAXSEED OIL PO) Take by mouth.    . furosemide (LASIX) 40 MG tablet TAKE ONE TABLET BY MOUTH ONCE DAILY 90 tablet 3  . metFORMIN (GLUCOPHAGE-XR) 500 MG 24 hr tablet Take 1 tablet (500 mg total) by mouth daily with breakfast. 30 tablet 2  . Multiple Vitamin (MULTIVITAMIN PO) Take 1 tablet by mouth at bedtime.     . Omega-3 Fatty Acids (FISH OIL PO) Take by mouth.    . rosuvastatin (CRESTOR) 10 MG tablet Take 10 mg by mouth daily.    . [DISCONTINUED] omega-3 acid ethyl esters (LOVAZA) 1 G capsule Take 1 g by mouth 3 (three) times daily.      . [DISCONTINUED] pravastatin (PRAVACHOL) 20 MG tablet Take 20 mg by mouth daily.       No current facility-administered medications on file prior to visit.     PAST MEDICAL HISTORY: Past Medical History:  Diagnosis Date  . Anxiety   . Back pain   . Chest pain    CLite with apical ischemia in 2006 - normal coronary arteries by [facility] in 12/2004;  Myoview 11/12:  Low risk stress nuclear study with a small, partially reversible apical defect most likely related to apical thinning; cannot R/O very mild apical ischemia.  EF: 75%   . Decreased hearing   . Depression   . Diabetes ([place])   . DVT (deep venous thrombosis) ([place]) 2006   hx of  . Easy bruising   . Ectopic pregnancy with intrauterine pregnancy   . Hiatal hernia   . Hx of blood clots   . Hyperlipidemia   . Joint pain   . Low back pain   . Muscle pain   . PE (pulmonary embolism) 2006   hx of  . Pre-diabetes   . Right shoulder pain   . Shortness of breath   . Sinus congestion   . Sleep apnea   . Sprained ankle   . Swelling of ankle   . Swelling of extremity   . UTI (lower urinary tract infection)    hx    PAST SURGICAL HISTORY: Past Surgical History:  Procedure Laterality Date  . ABDOMINAL EXPLORATION SURGERY    . BUNIONECTOMY     right    . ECTOPIC PREGNANCY SURGERY    . FOOT SURGERY    . TUBAL LIGATION      SOCIAL HISTORY: Social History   Tobacco Use  . Smoking status: Former Smoker    Packs/day: 1.00    Years: 50.00    Pack years: 50.00    Types: Cigarettes    Last attempt to quit: 11/21/2009    Years since quitting: 7.5  . Smokeless tobacco: Never Used  Substance Use Topics  . Alcohol use: No  . Drug use: No    FAMILY HISTORY: Family History  Problem Relation Age of Onset  . Heart failure Mother  died from  . Colon polyps Mother   . Hypertension Mother   . Sudden death Mother   . Obesity Mother   . Alcoholism Father   . Diabetes Maternal Museum/gallery exhibitions officer  . Diabetes Maternal Aunt     ROS: Review of Systems  Constitutional: Positive for malaise/fatigue. Negative for weight loss.  HENT:       + Decreased hearing + Nasal stuffiness  Eyes:       + Wear glasses or contacts  Respiratory: Positive for shortness of breath (with exertion).   Cardiovascular: Negative for orthopnea.  Gastrointestinal: Negative for nausea and vomiting.  Musculoskeletal: Positive for back pain.       Negative muscle weakness + Muscle or joint pain  Endo/Heme/Allergies: Bruises/bleeds easily.  Psychiatric/Behavioral: Positive for depression. Negative for suicidal ideas.    PHYSICAL EXAM: Blood pressure 108/70, pulse (!) 53, temperature 97.8 F (36.6 C), temperature source Oral, height '5\' 3"'  (1.6 m), weight 193 lb (87.5 kg), SpO2 98 %. Body mass index is 34.19 kg/m. Physical Exam  Constitutional: She is oriented to person, place, and time. She appears well-developed and well-nourished.  HENT:  Head: Normocephalic and atraumatic.  Nose: Nose normal.  Eyes: EOM are normal. No scleral icterus.  Neck: Normal range of motion. Neck supple. No thyromegaly present.  Cardiovascular: Normal rate and regular rhythm.  Pulmonary/Chest: Effort normal. No respiratory distress.  Abdominal: Soft. There is no  tenderness.  + Obesity  Musculoskeletal:  Range of Motion normal in all 4 extremities Trace edema noted in bilateral lower extremities  Neurological: She is alert and oriented to person, place, and time. Coordination normal.  Skin: Skin is warm and dry.  Psychiatric: She has a normal mood and affect. Her behavior is normal.  Vitals reviewed.   RECENT LABS AND TESTS: BMET    Component Value Date/Time   NA 139 04/08/2017 0935   NA 142 12/28/2016   K 4.4 04/08/2017 0935   CL 104 04/08/2017 0935   CO2 28 04/08/2017 0935   GLUCOSE 123 (H) 04/08/2017 0935   BUN 16 04/08/2017 0935   BUN 23 (A) 12/28/2016   CREATININE 0.81 04/08/2017 0935   CREATININE 0.82 10/04/2015 0845   CALCIUM 9.5 04/08/2017 0935   GFRNONAA 71 10/04/2015 0845   GFRAA 82 10/04/2015 0845   Lab Results  Component Value Date   HGBA1C 6.8 (H) 04/08/2017   No results found for: INSULIN CBC    Component Value Date/Time   WBC 4.1 12/28/2016   WBC 6.4 10/04/2015 0845   RBC 4.61 10/04/2015 0845   HGB 14.5 12/28/2016   HCT 42 12/28/2016   PLT 241 12/28/2016   MCV 91.8 10/04/2015 0845   MCH 31.5 01/01/2011 0744   MCHC 34.4 10/04/2015 0845   RDW 13.5 10/04/2015 0845   LYMPHSABS 2.5 10/04/2015 0845   MONOABS 0.5 10/04/2015 0845   EOSABS 0.3 10/04/2015 0845   BASOSABS 0.0 10/04/2015 0845   Iron/TIBC/Ferritin/ %Sat No results found for: IRON, TIBC, FERRITIN, IRONPCTSAT Lipid Panel     Component Value Date/Time   CHOL 195 05/14/2017 1007   TRIG 58 05/14/2017 1007   HDL 95 05/14/2017 1007   CHOLHDL 2.1 05/14/2017 1007   CHOLHDL 2 04/08/2017 0935   VLDL 11.8 04/08/2017 0935   LDLCALC 88 05/14/2017 1007   LDLDIRECT 95.9 10/06/2012 1248   Hepatic Function Panel     Component Value Date/Time   PROT 6.6 05/14/2017 1007   ALBUMIN  4.3 05/14/2017 1007   AST 19 05/14/2017 1007   ALT 19 05/14/2017 1007   ALKPHOS 78 05/14/2017 1007   BILITOT 1.0 05/14/2017 1007   BILIDIR 0.25 05/14/2017 1007   IBILI 0.9  11/15/2015 0831      Component Value Date/Time   TSH 1.27 12/28/2016   TSH 1.00 01/03/2015   TSH 1.61 08/07/2007 0938   TSH 1.08 01/10/2007 0000   TSH 1.16 08/20/2006 1203    ECG  shows NSR with a rate of 67 BPM INDIRECT CALORIMETER done today shows a VO2 of 244 and a REE of 1701.  Her calculated basal metabolic rate is 1194 thus her basal metabolic rate is better than expected.    ASSESSMENT AND PLAN: Other fatigue - Plan: CBC with Differential/Platelet, T3, T4, free, TSH  Shortness of breath on exertion  Controlled type 2 diabetes mellitus with other specified complication, without long-term current use of insulin (HCC) - Plan: Insulin, random  Vitamin D deficiency - Plan: VITAMIN D 25 Hydroxy (Vit-D Deficiency, Fractures)  Depression screening  Class 1 obesity with serious comorbidity and body mass index (BMI) of 34.0 to 34.9 in adult, unspecified obesity type  PLAN:  Fatigue Hollye was informed that her fatigue may be related to obesity, depression or many other causes. Labs will be ordered, and in the meanwhile Sierra Ford has agreed to work on diet, exercise and weight loss to help with fatigue. Proper sleep hygiene was discussed including the need for 7-8 hours of quality sleep each night. A sleep study was not ordered based on symptoms and Epworth score.  Dyspnea on exertion Sierra Ford's shortness of breath appears to be obesity related and exercise induced. She has agreed to work on weight loss and gradually increase exercise to treat her exercise induced shortness of breath. If Sierra Ford follows our instructions and loses weight without improvement of her shortness of breath, we will plan to refer to pulmonology. We will monitor this condition regularly. Sierra Ford agrees to this plan.  Diabetes II Sierra Ford has been given extensive diabetes education by myself today including ideal fasting and post-prandial blood glucose readings, individual ideal Hgb A1c goals and hypoglycemia prevention.  We discussed the importance of good blood sugar control to decrease the likelihood of diabetic complications such as nephropathy, neuropathy, limb loss, blindness, coronary artery disease, and death. We discussed the importance of intensive lifestyle modification including diet, exercise and weight loss as the first line treatment for diabetes. Ame agrees to continue her diabetes medications as prescribed. We will check labs and Sierra Ford agrees to follow up with our clinic in 2 weeks.  Vitamin D Deficiency Sierra Ford was informed that low vitamin D levels contributes to fatigue and are associated with obesity, breast, and colon cancer. Sierra Ford agrees to continue taking OTC Vit D and will follow up for routine testing of vitamin D, at least 2-3 times per year. She was informed of the risk of over-replacement of vitamin D and agrees to not increase her dose unless she discusses this with [name] first. We will check labs and Sierra Ford agrees to follow up with our clinic in 2 weeks.  Depression Screen Jahmiya had a strongly positive depression screening. Depression is commonly associated with obesity and often results in emotional eating behaviors. We will monitor this closely and work on CBT to help improve the non-hunger eating patterns. Referral to Psychology may be required if no improvement is seen as she continues in our clinic.  Obesity Bently is currently in the action stage of  change and her goal is to continue with weight loss efforts. I recommend Deyonna begin the structured treatment plan as follows:  She has agreed to follow the Category 2 plan + 100 calories Kynesha has been instructed to eventually work up to a goal of 150 minutes of combined cardio and strengthening exercise per week for weight loss and overall health benefits. We discussed the following Behavioral Modification Strategies today: increasing lean protein intake, decreasing simple carbohydrates, work on meal planning and easy cooking plans, increase  H20 intake, no skipping meals, better snacking choices, and planning for success   She was informed of the importance of frequent follow up visits to maximize her success with intensive lifestyle modifications for her multiple health conditions. She was informed we would discuss her lab results at her next visit unless there is a critical issue that needs to be addressed sooner. Madalynn agreed to keep her next visit at the agreed upon time to discuss these results.    OBESITY BEHAVIORAL INTERVENTION VISIT  Today's visit was # 1 out of 22.  Starting weight: 193 lbs Starting date: 05/27/17 Today's weight : 193 lbs  Today's date: 05/27/2017 Total lbs lost to date: 0 (Patients must lose 7 lbs in the first 6 months to continue with counseling)   ASK: We discussed the diagnosis of obesity with [name] today and Kirah agreed to give [name] permission to discuss obesity behavioral modification therapy today.  ASSESS: Orion has the diagnosis of obesity and her BMI today is 34.2 Damien is in the action stage of change   ADVISE: Zayley was educated on the multiple health risks of obesity as well as the benefit of weight loss to improve her health. She was advised of the need for long term treatment and the importance of lifestyle modifications.  AGREE: Multiple dietary modification options and treatment options were discussed and  Yolette agreed to the above obesity treatment plan.   I, Trixie Dredge, am acting as transcriptionist for Ilene Qua, MD  I have reviewed the above documentation for accuracy and completeness, and I agree with the above. - Ilene Qua, MD

## 2017-05-28 LAB — CBC WITH DIFFERENTIAL/PLATELET
BASOS: 0 %
Basophils Absolute: 0 10*3/uL (ref 0.0–0.2)
EOS (ABSOLUTE): 0.2 10*3/uL (ref 0.0–0.4)
EOS: 5 %
HEMATOCRIT: 42.2 % (ref 34.0–46.6)
HEMOGLOBIN: 14.4 g/dL (ref 11.1–15.9)
Immature Grans (Abs): 0 10*3/uL (ref 0.0–0.1)
Immature Granulocytes: 0 %
LYMPHS ABS: 2.1 10*3/uL (ref 0.7–3.1)
Lymphs: 45 %
MCH: 30.6 pg (ref 26.6–33.0)
MCHC: 34.1 g/dL (ref 31.5–35.7)
MCV: 90 fL (ref 79–97)
Monocytes Absolute: 0.4 10*3/uL (ref 0.1–0.9)
Monocytes: 9 %
NEUTROS ABS: 1.9 10*3/uL (ref 1.4–7.0)
Neutrophils: 41 %
Platelets: 221 10*3/uL (ref 150–379)
RBC: 4.7 x10E6/uL (ref 3.77–5.28)
RDW: 13.7 % (ref 12.3–15.4)
WBC: 4.7 10*3/uL (ref 3.4–10.8)

## 2017-05-28 LAB — T4, FREE: Free T4: 1.05 ng/dL (ref 0.82–1.77)

## 2017-05-28 LAB — INSULIN, RANDOM: INSULIN: 13.7 u[IU]/mL (ref 2.6–24.9)

## 2017-05-28 LAB — TSH: TSH: 1.25 u[IU]/mL (ref 0.450–4.500)

## 2017-05-28 LAB — T3: T3 TOTAL: 81 ng/dL (ref 71–180)

## 2017-05-28 LAB — VITAMIN D 25 HYDROXY (VIT D DEFICIENCY, FRACTURES): VIT D 25 HYDROXY: 45.9 ng/mL (ref 30.0–100.0)

## 2017-05-29 ENCOUNTER — Encounter (INDEPENDENT_AMBULATORY_CARE_PROVIDER_SITE_OTHER): Payer: Self-pay | Admitting: Orthopaedic Surgery

## 2017-05-29 ENCOUNTER — Ambulatory Visit (INDEPENDENT_AMBULATORY_CARE_PROVIDER_SITE_OTHER): Payer: Medicare PPO | Admitting: Orthopaedic Surgery

## 2017-05-29 VITALS — BP 113/67 | HR 71 | Ht 63.0 in | Wt 192.0 lb

## 2017-05-29 DIAGNOSIS — M48062 Spinal stenosis, lumbar region with neurogenic claudication: Secondary | ICD-10-CM

## 2017-05-29 NOTE — Progress Notes (Signed)
Office Visit Note   Patient: Sierra Ford           Date of Birth: 1940-10-13           MRN: [id number] Visit Date: 05/29/2017              Requested by: [address], [place], [place] [address] [place], [place] [address] PCP: Carollee Herter, Alferd Apa, DO   Assessment & Plan: Visit Diagnoses:  1. Spinal stenosis of lumbar region with neurogenic claudication     Plan: Patient has severe stenosis at L3-4 without pars defect claudication symptoms.  Mild stenosis at L4-5.  Her symptoms are not severe enough to consider operative intervention at this point.  We will check her back again in 3 months and on return we will get a lateral flexion-extension lumbar spine x-rays for review.  If she develops increased claudication in the meantime she can return earlier.  MRI scan was reviewed I gave her a copy of the report and we discussed the pathophysiology.  Treatment options were also discussed but her symptoms are not severe enough to consider this at this time.  Follow-Up Instructions: Return in about 3 months (around 08/28/2017).   Orders:  No orders of the defined types were placed in this encounter.  No orders of the defined types were placed in this encounter.     Procedures: No procedures performed   Clinical Data: No additional findings.   Subjective: Chief Complaint  Patient presents with  . Lower Back - Pain    MRI review    HPI 77 year old female returns with persistent problems with neurogenic claudication symptoms and has MRI scan available for review.  She states her ankle is doing better but she continues to have pain when she stands and problems when she walks.  Can make it about 2 blocks and stand for 10 minutes.  She denies chills or fever no bowel bladder symptoms.  Review of Systems 14 point update unchanged from 05/07/2017 office visit other than mentioned above.  Of note is history of diastolic heart failure, carotid artery  disease.   Objective: Vital Signs: BP 113/67 (BP Location: Right Arm, Patient Position: Sitting, Cuff Size: Large)   Pulse 71   Ht 5\' 3"  (1.6 m)   Wt 192 lb (87.1 kg)   BMI 34.01 kg/m   Physical Exam  Constitutional: She is oriented to person, place, and time. She appears well-developed.  HENT:  Head: Normocephalic.  Right Ear: External ear normal.  Left Ear: External ear normal.  Eyes: Pupils are equal, round, and reactive to light.  Neck: No tracheal deviation present. No thyromegaly present.  Cardiovascular: Normal rate.  Pulmonary/Chest: Effort normal.  Abdominal: Soft.  Neurological: She is alert and oriented to person, place, and time.  Skin: Skin is warm and dry.  Psychiatric: She has a normal mood and affect. Her behavior is normal.    Ortho Exam patient still has mild discomfort with hip internal and external rotation greater on the left than right.  Pulses are intact.  Negative straight leg raising 90 degrees.  Knee has mild crepitus but good range of motion.  Gastrocsoleus anterior tib is intact.  Sensation of the foot is intact.  Specialty Comments:  No specialty comments available.  Imaging: CLINICAL DATA:  Low back pain for 3 months, worse with standing and walking. No injury.  EXAM: MRI LUMBAR SPINE WITHOUT CONTRAST  TECHNIQUE: Multiplanar, multisequence MR imaging of the  lumbar spine was performed. No intravenous contrast was administered.  COMPARISON:  Lumbar spine radiograph March 13, 2017  FINDINGS: SEGMENTATION: For the purposes of this report, the last well-formed intervertebral disc is reported as L5-S1.  ALIGNMENT: Maintained lumbar lordosis. Grade 1 (4 mm) L3-4 anterolisthesis. No spondylolysis.  VERTEBRAE:Vertebral bodies are intact. Moderate to severe L3-4 disc height loss. Remaining disc morphology generally preserved with mild disc desiccation mid lumbar discs. Mild chronic discogenic endplate changes A1-2. No suspicious or  acute bone marrow signal.  CONUS MEDULLARIS AND CAUDA EQUINA: Conus medullaris terminates at T12-L1 and demonstrates normal morphology and signal characteristics. Cauda equina is normal.  PARASPINAL AND OTHER SOFT TISSUES: Nonacute. Asymmetrically prominent T1 and T2 bright fat signal RIGHT paraspinal muscles at lumbosacral junction favoring small intramuscular lipoma. Dependent subcutaneous edema.  DISC LEVELS:  T12-L1, L1-2: No disc bulge, canal stenosis nor neural foraminal narrowing. Mild facet arthropathy.  L2-3: Annular bulging. Moderate facet arthropathy and ligamentum flavum redundancy without canal stenosis or neural foraminal narrowing.  L3-4: Anterolisthesis. Moderate to severe facet arthropathy and ligamentum flavum redundancy. Central annular fissure. Severe canal stenosis, AP dimension of the thecal sac is 5 mm. Lateral recess narrowing may affect the traversing L4 nerves. Moderate bilateral neural foraminal narrowing.  L4-5: 3 mm broad-based disc bulge asymmetric to the RIGHT. RIGHT annular fissure. Moderate facet arthropathy and ligamentum flavum redundancy. Mild canal stenosis. Moderate RIGHT, mild-to-moderate LEFT neural foraminal narrowing.  L5-S1: 2 mm broad-based disc bulge asymmetric to the RIGHT. Moderate to severe RIGHT, mild LEFT facet arthropathy. No canal stenosis. Minimal RIGHT neural foraminal narrowing.  IMPRESSION: 1. Grade 1 L3-4 anterolisthesis without spondylolysis. No fracture or acute osseous process. 2. 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  Mild canal stenosis L4-5. 3. Neural foraminal narrowing L3-4 through L5-S1: Moderate at L3-4 and L4-5.   Electronically Signed   By: Elon Alas M.D.   On: 05/23/2017 14:12    PMFS History: Patient Active Problem List   Diagnosis Date Noted  . Right ankle swelling 05/13/2017  . DM (diabetes mellitus) type II uncontrolled, periph vascular disorder ([place]) 04/08/2017  . Essential  hypertension 04/08/2017  . Atherosclerosis of aorta ([place]) 04/08/2017  . OSA (obstructive sleep apnea) 12/16/2015  . DOE (dyspnea on exertion) 06/12/2013  . Obesity (BMI 30-39.9) 04/24/2013  . Chest pain, unspecified 01/22/2011  . Abnormal stress test 01/22/2011  . Frequent urination 01/22/2011  . Chronic diastolic heart failure ([place]) 05/15/2009  . 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  . RASH-NONVESICULAR 04/20/2009  . CERVICAL STRAIN, WITH RADICULOPATHY 03/31/2009  . CAROTID ARTERY DISEASE 01/12/2009  . CERVICAL STRAIN 01/11/2009  . LEG EDEMA, RIGHT 07/27/2008  . HX, URINARY INFECTION 09/19/2006  . Hyperlipidemia 08/20/2006  . PREGNANCY, ECTOPIC NEC W/INTRAUTERINE PRG 08/20/2006  . FREQUENCY, URINARY 08/20/2006  . Other specified abnormal findings of blood chemistry 08/20/2006  . Personal history of venous thrombosis and embolism 08/20/2006   Past Medical History:  Diagnosis Date  . Anxiety   . Back pain   . Chest pain    CLite with apical ischemia in 2006 - normal coronary arteries by [facility] in 12/2004;  Myoview 11/12:  Low risk stress nuclear study with a small, partially reversible apical defect most likely related to apical thinning; cannot R/O very mild apical ischemia.  EF: 75%   . Decreased hearing   . Depression   . Diabetes ([place])   . DVT (deep venous thrombosis) ([place]) 2006   hx of  . Easy bruising   . Ectopic pregnancy with intrauterine  pregnancy   . Hiatal hernia   . Hx of blood clots   . Hyperlipidemia   . Joint pain   . Low back pain   . Muscle pain   . PE (pulmonary embolism) 2006   hx of  . Pre-diabetes   . Right shoulder pain   . Shortness of breath   . Sinus congestion   . Sleep apnea   . Sprained ankle   . Swelling of ankle   . Swelling of extremity   . UTI (lower urinary tract infection)    hx    Family History  Problem Relation Age of Onset  . Heart failure Mother        died from  . Colon polyps Mother   . Hypertension Mother   .  Sudden death Mother   . Obesity Mother   . Alcoholism Father   . Diabetes Maternal Museum/gallery exhibitions officer  . Diabetes Maternal Aunt     Past Surgical History:  Procedure Laterality Date  . ABDOMINAL EXPLORATION SURGERY    . BUNIONECTOMY     right  . ECTOPIC PREGNANCY SURGERY    . FOOT SURGERY    . TUBAL LIGATION     Social History   Occupational History  . Occupation: Retired Scientist, research (medical)  Tobacco Use  . Smoking status: Former Smoker    Packs/day: 1.00    Years: 50.00    Pack years: 50.00    Types: Cigarettes    Last attempt to quit: 11/21/2009    Years since quitting: 7.5  . Smokeless tobacco: Never Used  Substance and Sexual Activity  . Alcohol use: No  . Drug use: No  . Sexual activity: Not Currently    Partners: Male

## 2017-05-30 ENCOUNTER — Ambulatory Visit (INDEPENDENT_AMBULATORY_CARE_PROVIDER_SITE_OTHER): Payer: Medicare PPO | Admitting: Family Medicine

## 2017-05-30 ENCOUNTER — Encounter: Payer: Self-pay | Admitting: Family Medicine

## 2017-05-30 VITALS — BP 110/72 | HR 84 | Temp 98.1°F | Ht 63.0 in | Wt 192.1 lb

## 2017-05-30 DIAGNOSIS — J40 Bronchitis, not specified as acute or chronic: Secondary | ICD-10-CM

## 2017-05-30 MED ORDER — PREDNISONE 20 MG PO TABS: 40.0000 mg | ORAL_TABLET | Freq: Every day | ORAL | 0 refills | Status: AC

## 2017-05-30 MED ORDER — ALBUTEROL SULFATE 108 (90 BASE) MCG/ACT IN AEPB: 1.0000 | INHALATION_SPRAY | Freq: Four times a day (QID) | RESPIRATORY_TRACT | 0 refills | Status: DC | PRN

## 2017-05-30 NOTE — Progress Notes (Signed)
Pre visit review using our clinic review tool, if applicable. No additional management support is needed unless otherwise documented below in the visit note. 

## 2017-05-30 NOTE — Patient Instructions (Signed)
Continue to push fluids, practice good hand hygiene, and cover your mouth if you cough.  If you start having fevers, shaking or shortness of breath, seek immediate care.  Let us know if you need anything.  

## 2017-05-30 NOTE — Progress Notes (Signed)
Chief Complaint  Patient presents with  . Cough    congestion  . Wheezing    Sierra Ford here for URI complaints.  Duration: 4 days  Associated symptoms: sinus congestion, wheezing and cough Denies: sinus pain, rhinorrhea, itchy watery eyes, ear pain, ear drainage, sore throat, shortness of breath, myalgia and fevers Treatment to date: none Sick contacts: No  ROS:  Const: Denies fevers HEENT: As noted in HPI Lungs: +cough  Past Medical History:  Diagnosis Date  . Anxiety   . Back pain   . Chest pain    CLite with apical ischemia in 2006 - normal coronary arteries by [facility] in 12/2004;  Myoview 11/12:  Low risk stress nuclear study with a small, partially reversible apical defect most likely related to apical thinning; cannot R/O very mild apical ischemia.  EF: 75%   . Decreased hearing   . Depression   . Diabetes ([place])   . DVT (deep venous thrombosis) ([place]) 2006   hx of  . Easy bruising   . Ectopic pregnancy with intrauterine pregnancy   . Hiatal hernia   . Hx of blood clots   . Hyperlipidemia   . Joint pain   . Low back pain   . Muscle pain   . PE (pulmonary embolism) 2006   hx of  . Pre-diabetes   . Right shoulder pain   . Shortness of breath   . Sinus congestion   . Sleep apnea   . Sprained ankle   . Swelling of ankle   . Swelling of extremity   . UTI (lower urinary tract infection)    hx     BP 110/72 (BP Location: Left Arm, Patient Position: Sitting, Cuff Size: Large)   Pulse 84   Temp 98.1 F (36.7 C) (Oral)   Ht 5\' 3"  (1.6 m)   Wt 192 lb 2 oz (87.1 kg)   SpO2 94%   BMI 34.03 kg/m  General: Awake, alert, appears stated age HEENT: AT, [place], ears patent b/l and TM's neg, nares patent w/o discharge, pharynx pink and without exudates, MMM Neck: No masses or asymmetry Heart: RRR Lungs: Diffuse exp wheezes, no accessory muscle use Psych: Age appropriate judgment and insight, normal mood and affect  Wheezy bronchitis - Plan: Albuterol Sulfate 108  (90 Base) MCG/ACT AEPB, predniSONE (DELTASONE) 20 MG tablet  Orders as above. Continue to push fluids, practice good hand hygiene, cover mouth when coughing. F/u prn. If starting to experience fevers, shaking, or shortness of breath, seek immediate care. Pt voiced understanding and agreement to the plan.  [place], DO 05/30/17 1:39 PM

## 2017-06-08 ENCOUNTER — Other Ambulatory Visit: Payer: Self-pay | Admitting: Family Medicine

## 2017-06-10 ENCOUNTER — Ambulatory Visit (INDEPENDENT_AMBULATORY_CARE_PROVIDER_SITE_OTHER): Payer: Medicare PPO | Admitting: Family Medicine

## 2017-06-10 VITALS — BP 113/71 | HR 68 | Temp 97.7°F | Ht 63.0 in | Wt 181.0 lb

## 2017-06-10 DIAGNOSIS — E559 Vitamin D deficiency, unspecified: Secondary | ICD-10-CM | POA: Diagnosis not present

## 2017-06-10 DIAGNOSIS — E119 Type 2 diabetes mellitus without complications: Secondary | ICD-10-CM | POA: Diagnosis not present

## 2017-06-10 DIAGNOSIS — E669 Obesity, unspecified: Secondary | ICD-10-CM | POA: Diagnosis not present

## 2017-06-10 DIAGNOSIS — Z6832 Body mass index (BMI) 32.0-32.9, adult: Secondary | ICD-10-CM | POA: Diagnosis not present

## 2017-06-10 NOTE — Progress Notes (Signed)
Office: [phone]  /  Fax: [phone]   HPI:   Chief Complaint: OBESITY Sierra Ford is here to discuss her progress with her obesity treatment plan. She is on the Category 2 plan + 100 calories and is following her eating plan approximately 100 % of the time. She states she is doing water aerobics and silver sneakers for 90 minutes 6 times per week. Sierra Ford really enjoyed meal plan, never hungry. Sometimes only doing 6 oz at dinner.  Her weight is 181 lb (82.1 kg) today and has had a weight loss of 12 pounds over a period of 2 weeks since her last visit. She has lost 12 lbs since starting treatment with [name].  Vitamin D Deficiency Sierra Ford has a diagnosis of vitamin D deficiency. She is on 2,000 IU per day Vit D and denies nausea, vomiting or muscle weakness.  Diabetes II Sierra Ford has a diagnosis of diabetes type II. Sierra Ford last Hgb A1c was 6.8 and insulin 13.7. She is on metformin 500 mg and she states fasting BGs range between 109 and 138 and post prandials range between 110 and 138. She denies any hypoglycemic episodes. She has been working on intensive lifestyle modifications including diet, exercise, and weight loss to help control her blood glucose levels.  ALLERGIES: No Known Allergies  MEDICATIONS: Current Outpatient Medications on File Prior to Visit  Medication Sig Dispense Refill  . Albuterol Sulfate 108 (90 Base) MCG/ACT AEPB Inhale 1-2 puffs into the lungs every 6 (six) hours as needed. 1 each 0  . aspirin EC 81 MG tablet Take 81 mg by mouth at bedtime.      . blood glucose meter kit and supplies KIT Dispense based on patient and insurance preference. Use up to four times daily as directed. (FOR ICD-9 250.00, 250.01). 1 each 0  . CALCIUM-VITAMIN D PO Take 600 Units by mouth 2 (two) times daily.      . cholecalciferol (VITAMIN D-400) 400 UNITS TABS Take 400 Units by mouth 2 (two) times daily.     . Coenzyme Q10 100 MG TABS Take 100 mg by mouth daily.    Mar[place] Kitchen ezetimibe (ZETIA) 10 MG  tablet TAKE 1 TABLET BY MOUTH ONCE DAILY 90 tablet 2  . Flaxseed, Linseed, (FLAXSEED OIL PO) Take by mouth.    . furosemide (LASIX) 40 MG tablet TAKE ONE TABLET BY MOUTH ONCE DAILY 90 tablet 3  . metFORMIN (GLUCOPHAGE-XR) 500 MG 24 hr tablet Take 1 tablet (500 mg total) by mouth daily with breakfast. 30 tablet 2  . Multiple Vitamin (MULTIVITAMIN PO) Take 1 tablet by mouth at bedtime.     . Omega-3 Fatty Acids (FISH OIL PO) Take by mouth.    . rosuvastatin (CRESTOR) 10 MG tablet Take 10 mg by mouth daily.    . [DISCONTINUED] omega-3 acid ethyl esters (LOVAZA) 1 G capsule Take 1 g by mouth 3 (three) times daily.      . [DISCONTINUED] pravastatin (PRAVACHOL) 20 MG tablet Take 20 mg by mouth daily.       No current facility-administered medications on file prior to visit.     PAST MEDICAL HISTORY: Past Medical History:  Diagnosis Date  . Anxiety   . Back pain   . Chest pain    CLite with apical ischemia in 2006 - normal coronary arteries by [facility] in 12/2004;  Myoview 11/12:  Low risk stress nuclear study with a small, partially reversible apical defect most likely related to apical thinning; cannot R/O very mild apical  ischemia.  EF: 75%   . Decreased hearing   . Depression   . Diabetes ([place])   . DVT (deep venous thrombosis) ([place]) 2006   hx of  . Easy bruising   . Ectopic pregnancy with intrauterine pregnancy   . Hiatal hernia   . Hx of blood clots   . Hyperlipidemia   . Joint pain   . Low back pain   . Muscle pain   . PE (pulmonary embolism) 2006   hx of  . Pre-diabetes   . Right shoulder pain   . Shortness of breath   . Sinus congestion   . Sleep apnea   . Sprained ankle   . Swelling of ankle   . Swelling of extremity   . UTI (lower urinary tract infection)    hx    PAST SURGICAL HISTORY: Past Surgical History:  Procedure Laterality Date  . ABDOMINAL EXPLORATION SURGERY    . BUNIONECTOMY     right  . ECTOPIC PREGNANCY SURGERY    . FOOT SURGERY    . TUBAL LIGATION        SOCIAL HISTORY: Social History   Tobacco Use  . Smoking status: Former Smoker    Packs/day: 1.00    Years: 50.00    Pack years: 50.00    Types: Cigarettes    Last attempt to quit: 11/21/2009    Years since quitting: 7.5  . Smokeless tobacco: Never Used  Substance Use Topics  . Alcohol use: No  . Drug use: No    FAMILY HISTORY: Family History  Problem Relation Age of Onset  . Heart failure Mother        died from  . Colon polyps Mother   . Hypertension Mother   . Sudden death Mother   . Obesity Mother   . Alcoholism Father   . Diabetes Maternal Museum/gallery exhibitions officer  . Diabetes Maternal Aunt     ROS: Review of Systems  Constitutional: Positive for weight loss.  Gastrointestinal: Negative for nausea and vomiting.  Musculoskeletal:       Negative muscle weakness  Endo/Heme/Allergies:       Negative hypoglycemia    PHYSICAL EXAM: Blood pressure 113/71, pulse 68, temperature 97.7 F (36.5 C), temperature source Oral, height _0  (1.6 m), weight 181 lb (82.1 kg), SpO2 97 %. Body mass index is 32.06 kg/m. Physical Exam  Constitutional: She is oriented to person, place, and time. She appears well-developed and well-nourished.  Cardiovascular: Normal rate.  Pulmonary/Chest: Effort normal.  Musculoskeletal: Normal range of motion.  Neurological: She is oriented to person, place, and time.  Skin: Skin is warm and dry.  Psychiatric: She has a normal mood and affect. Her behavior is normal.  Vitals reviewed.   RECENT LABS AND TESTS: BMET    Component Value Date/Time   NA 139 04/08/2017 0935   NA 142 12/28/2016   K 4.4 04/08/2017 0935   CL 104 04/08/2017 0935   CO2 28 04/08/2017 0935   GLUCOSE 123 (H) 04/08/2017 0935   BUN 16 04/08/2017 0935   BUN 23 (A) 12/28/2016   CREATININE 0.81 04/08/2017 0935   CREATININE 0.82 10/04/2015 0845   CALCIUM 9.5 04/08/2017 0935   GFRNONAA 71 10/04/2015 0845   GFRAA 82 10/04/2015 0845   Lab Results  Component  Value Date   HGBA1C 6.8 (H) 04/08/2017   HGBA1C 6.7 12/28/2016   HGBA1C 6.7 (H) 09/27/2016   HGBA1C 6.8 (H) 06/28/2016  HGBA1C 6.6 (H) 12/13/2015   Lab Results  Component Value Date   INSULIN 13.7 05/27/2017   CBC    Component Value Date/Time   WBC 4.7 05/27/2017 1224   WBC 6.4 10/04/2015 0845   RBC 4.70 05/27/2017 1224   RBC 4.61 10/04/2015 0845   HGB 14.4 05/27/2017 1224   HCT 42.2 05/27/2017 1224   PLT 221 05/27/2017 1224   MCV 90 05/27/2017 1224   MCH 30.6 05/27/2017 1224   MCH 31.5 01/01/2011 0744   MCHC 34.1 05/27/2017 1224   MCHC 34.4 10/04/2015 0845   RDW 13.7 05/27/2017 1224   LYMPHSABS 2.1 05/27/2017 1224   MONOABS 0.5 10/04/2015 0845   EOSABS 0.2 05/27/2017 1224   BASOSABS 0.0 05/27/2017 1224   Iron/TIBC/Ferritin/ %Sat No results found for: IRON, TIBC, FERRITIN, IRONPCTSAT Lipid Panel     Component Value Date/Time   CHOL 195 05/14/2017 1007   TRIG 58 05/14/2017 1007   HDL 95 05/14/2017 1007   CHOLHDL 2.1 05/14/2017 1007   CHOLHDL 2 04/08/2017 0935   VLDL 11.8 04/08/2017 0935   LDLCALC 88 05/14/2017 1007   LDLDIRECT 95.9 10/06/2012 1248   Hepatic Function Panel     Component Value Date/Time   PROT 6.6 05/14/2017 1007   ALBUMIN 4.3 05/14/2017 1007   AST 19 05/14/2017 1007   ALT 19 05/14/2017 1007   ALKPHOS 78 05/14/2017 1007   BILITOT 1.0 05/14/2017 1007   BILIDIR 0.25 05/14/2017 1007   IBILI 0.9 11/15/2015 0831      Component Value Date/Time   TSH 1.250 05/27/2017 1224   TSH 1.27 12/28/2016   TSH 1.00 01/03/2015   TSH 1.61 08/07/2007 0938   TSH 1.08 01/10/2007 0000  Results for ALEEA, HENDRY (MRN [id number]) as of 06/10/2017 17:55  Ref. Range 05/27/2017 12:24  Vitamin D, 25-Hydroxy Latest Ref Range: 30.0 - 100.0 ng/mL 45.9    ASSESSMENT AND PLAN: Vitamin D deficiency  Type 2 diabetes mellitus without complication, without long-term current use of insulin (HCC)  Class 1 obesity with serious comorbidity and body mass index (BMI) of  32.0 to 32.9 in adult, unspecified obesity type  PLAN:  Vitamin D Deficiency Sierra Ford was informed that low vitamin D levels contributes to fatigue and are associated with obesity, breast, and colon cancer. Sierra Ford agrees to continue taking current Vit D supplement and will follow up for routine testing of vitamin D, at least 2-3 times per year. She was informed of the risk of over-replacement of vitamin D and agrees to not increase her dose unless she discusses this with [name] first. Sierra Ford agrees to follow up with our clinic in 2 weeks.  Diabetes II Sierra Ford has been given extensive diabetes education by myself today including ideal fasting and post-prandial blood glucose readings, individual ideal Hgb A1c goals and hypoglycemia prevention. We discussed the importance of good blood sugar control to decrease the likelihood of diabetic complications such as nephropathy, neuropathy, limb loss, blindness, coronary artery disease, and death. We discussed the importance of intensive lifestyle modification including diet, exercise and weight loss as the first line treatment for diabetes. Sierra Ford agrees to continue metformin and continue testing her BGs and she agrees agrees to follow up with our clinic in 2 weeks.  We spent > than 50% of the 15 minute visit on the counseling as documented in the note.  Obesity Sierra Ford is currently in the action stage of change. As such, her goal is to continue with weight loss efforts She has agreed to  follow the Category 2 plan + 100 calories, add 1/2 cottage cheese + 1 egg Sierra Ford has been instructed to work up to a goal of 150 minutes of combined cardio and strengthening exercise per week for weight loss and overall health benefits. We discussed the following Behavioral Modification Strategies today: increasing lean protein intake, increase H20 intake, work on meal planning and easy cooking plans, and planning for success   Sierra Ford has agreed to follow up with our clinic in 2 weeks.  She was informed of the importance of frequent follow up visits to maximize her success with intensive lifestyle modifications for her multiple health conditions.   OBESITY BEHAVIORAL INTERVENTION VISIT  Today's visit was # 2 out of 22.  Starting weight: 193 lbs Starting date: 05/27/17 Today's weight : 181 lbs Today's date: 06/10/2017 Total lbs lost to date: 12 (Patients must lose 7 lbs in the first 6 months to continue with counseling)   ASK: We discussed the diagnosis of obesity with Sierra Ford today and Sierra Ford agreed to give [name] permission to discuss obesity behavioral modification therapy today.  ASSESS: Sierra Ford has the diagnosis of obesity and her BMI today is 32.07 Sierra Ford is in the action stage of change   ADVISE: Sierra Ford was educated on the multiple health risks of obesity as well as the benefit of weight loss to improve her health. She was advised of the need for long term treatment and the importance of lifestyle modifications.  AGREE: Multiple dietary modification options and treatment options were discussed and  Sierra Ford agreed to the above obesity treatment plan.  I, Trixie Dredge, am acting as transcriptionist for Ilene Qua, MD  I have reviewed the above documentation for accuracy and completeness, and I agree with the above. - Ilene Qua, MD

## 2017-06-26 ENCOUNTER — Ambulatory Visit (INDEPENDENT_AMBULATORY_CARE_PROVIDER_SITE_OTHER): Payer: Medicare PPO | Admitting: Family Medicine

## 2017-06-26 VITALS — BP 117/80 | HR 68 | Temp 97.8°F | Ht 63.0 in | Wt 178.0 lb

## 2017-06-26 DIAGNOSIS — E7849 Other hyperlipidemia: Secondary | ICD-10-CM

## 2017-06-26 DIAGNOSIS — E119 Type 2 diabetes mellitus without complications: Secondary | ICD-10-CM

## 2017-06-26 DIAGNOSIS — E669 Obesity, unspecified: Secondary | ICD-10-CM

## 2017-06-26 DIAGNOSIS — Z6831 Body mass index (BMI) 31.0-31.9, adult: Secondary | ICD-10-CM | POA: Diagnosis not present

## 2017-06-26 MED ORDER — METFORMIN HCL ER 500 MG PO TB24: 500.0000 mg | ORAL_TABLET | Freq: Every day | ORAL | 0 refills | Status: DC

## 2017-06-26 NOTE — Progress Notes (Signed)
Office: [phone]  /  Fax: [phone]   HPI:   Chief Complaint: OBESITY Sierra Ford is here to discuss her progress with her obesity treatment plan. She is on the Category 2 plan + 100 calories, add 1/2 cottage cheese + 1 egg and is following her eating plan approximately 90 % of the time. She states she is doing water aerobics for 45 minutes 5 times per week. Sierra Ford has a d hard time getting down all of breakfast. Still enjoying the rest of the meal plan, wants to go back to just eggs without sausage.  Her weight is 178 lb (80.7 kg) today and has had a weight loss of 3 pounds over a period of 2 weeks since her last visit. She has lost 15 lbs since starting treatment with [name].  Diabetes II Sierra Ford has a diagnosis of diabetes type II. Alois denies carbohydrate cravings or hunger. She is only checking BGs sporadically. She denies any hypoglycemic episodes. Last A1c was 6.8 on 04/08/17. She has been working on intensive lifestyle modifications including diet, exercise, and weight loss to help control her blood glucose levels.  Hyperlipidemia Sierra Ford has hyperlipidemia and has been trying to improve her cholesterol levels with intensive lifestyle modification including a low saturated fat diet, exercise and weight loss. She denies any chest pain, claudication or myalgias.  ALLERGIES: No Known Allergies  MEDICATIONS: Current Outpatient Medications on File Prior to Visit  Medication Sig Dispense Refill  . Albuterol Sulfate 108 (90 Base) MCG/ACT AEPB Inhale 1-2 puffs into the lungs every 6 (six) hours as needed. 1 each 0  . aspirin EC 81 MG tablet Take 81 mg by mouth at bedtime.      . blood glucose meter kit and supplies KIT Dispense based on patient and insurance preference. Use up to four times daily as directed. (FOR ICD-9 250.00, 250.01). 1 each 0  . CALCIUM-VITAMIN D PO Take 600 Units by mouth 2 (two) times daily.      . cholecalciferol (VITAMIN D-400) 400 UNITS TABS Take 400 Units by mouth 2  (two) times daily.     . Coenzyme Q10 100 MG TABS Take 100 mg by mouth daily.    Marland Kitchen ezetimibe (ZETIA) 10 MG tablet TAKE 1 TABLET BY MOUTH ONCE DAILY 90 tablet 2  . Flaxseed, Linseed, (FLAXSEED OIL PO) Take by mouth.    . furosemide (LASIX) 40 MG tablet TAKE ONE TABLET BY MOUTH ONCE DAILY 90 tablet 3  . Multiple Vitamin (MULTIVITAMIN PO) Take 1 tablet by mouth at bedtime.     . Omega-3 Fatty Acids (FISH OIL PO) Take by mouth.    . rosuvastatin (CRESTOR) 10 MG tablet Take 10 mg by mouth daily.    . TRUE METRIX BLOOD GLUCOSE TEST test strip USE UP TO FOUR TIMES DAILY AS DIRECTED 100 each 0  . TRUEPLUS LANCETS 33G MISC USE UP TO FOUR TIMES DAILY AS DIRECTED 100 each 0  . [DISCONTINUED] omega-3 acid ethyl esters (LOVAZA) 1 G capsule Take 1 g by mouth 3 (three) times daily.      . [DISCONTINUED] pravastatin (PRAVACHOL) 20 MG tablet Take 20 mg by mouth daily.       No current facility-administered medications on file prior to visit.     PAST MEDICAL HISTORY: Past Medical History:  Diagnosis Date  . Anxiety   . Back pain   . Chest pain    CLite with apical ischemia in 2006 - normal coronary arteries by [facility] in 12/2004;  Myoview  11/12:  Low risk stress nuclear study with a small, partially reversible apical defect most likely related to apical thinning; cannot R/O very mild apical ischemia.  EF: 75%   . Decreased hearing   . Depression   . Diabetes ([place])   . DVT (deep venous thrombosis) ([place]) 2006   hx of  . Easy bruising   . Ectopic pregnancy with intrauterine pregnancy   . Hiatal hernia   . Hx of blood clots   . Hyperlipidemia   . Joint pain   . Low back pain   . Muscle pain   . PE (pulmonary embolism) 2006   hx of  . Pre-diabetes   . Right shoulder pain   . Shortness of breath   . Sinus congestion   . Sleep apnea   . Sprained ankle   . Swelling of ankle   . Swelling of extremity   . UTI (lower urinary tract infection)    hx    PAST SURGICAL HISTORY: Past Surgical History:    Procedure Laterality Date  . ABDOMINAL EXPLORATION SURGERY    . BUNIONECTOMY     right  . ECTOPIC PREGNANCY SURGERY    . FOOT SURGERY    . TUBAL LIGATION      SOCIAL HISTORY: Social History   Tobacco Use  . Smoking status: Former Smoker    Packs/day: 1.00    Years: 50.00    Pack years: 50.00    Types: Cigarettes    Last attempt to quit: 11/21/2009    Years since quitting: 7.6  . Smokeless tobacco: Never Used  Substance Use Topics  . Alcohol use: No  . Drug use: No    FAMILY HISTORY: Family History  Problem Relation Age of Onset  . Heart failure Mother        died from  . Colon polyps Mother   . Hypertension Mother   . Sudden death Mother   . Obesity Mother   . Alcoholism Father   . Diabetes Maternal Museum/gallery exhibitions officer  . Diabetes Maternal Aunt     ROS: Review of Systems  Constitutional: Positive for weight loss.  Cardiovascular: Negative for chest pain and claudication.  Musculoskeletal: Negative for myalgias.  Endo/Heme/Allergies:       Negative hypoglycemia    PHYSICAL EXAM: Blood pressure 117/80, pulse 68, temperature 97.8 F (36.6 C), temperature source Oral, height '5\' 3"'  (1.6 m), weight 178 lb (80.7 kg), SpO2 98 %. Body mass index is 31.53 kg/m. Physical Exam  Constitutional: She is oriented to person, place, and time. She appears well-developed and well-nourished.  Cardiovascular: Normal rate.  Pulmonary/Chest: Effort normal.  Musculoskeletal: Normal range of motion.  Neurological: She is oriented to person, place, and time.  Skin: Skin is warm and dry.  Psychiatric: She has a normal mood and affect. Her behavior is normal.  Vitals reviewed.   RECENT LABS AND TESTS: BMET    Component Value Date/Time   NA 139 04/08/2017 0935   NA 142 12/28/2016   K 4.4 04/08/2017 0935   CL 104 04/08/2017 0935   CO2 28 04/08/2017 0935   GLUCOSE 123 (H) 04/08/2017 0935   BUN 16 04/08/2017 0935   BUN 23 (A) 12/28/2016   CREATININE 0.81 04/08/2017  0935   CREATININE 0.82 10/04/2015 0845   CALCIUM 9.5 04/08/2017 0935   GFRNONAA 71 10/04/2015 0845   GFRAA 82 10/04/2015 0845   Lab Results  Component Value Date   HGBA1C  6.8 (H) 04/08/2017   HGBA1C 6.7 12/28/2016   HGBA1C 6.7 (H) 09/27/2016   HGBA1C 6.8 (H) 06/28/2016   HGBA1C 6.6 (H) 12/13/2015   Lab Results  Component Value Date   INSULIN 13.7 05/27/2017   CBC    Component Value Date/Time   WBC 4.7 05/27/2017 1224   WBC 6.4 10/04/2015 0845   RBC 4.70 05/27/2017 1224   RBC 4.61 10/04/2015 0845   HGB 14.4 05/27/2017 1224   HCT 42.2 05/27/2017 1224   PLT 221 05/27/2017 1224   MCV 90 05/27/2017 1224   MCH 30.6 05/27/2017 1224   MCH 31.5 01/01/2011 0744   MCHC 34.1 05/27/2017 1224   MCHC 34.4 10/04/2015 0845   RDW 13.7 05/27/2017 1224   LYMPHSABS 2.1 05/27/2017 1224   MONOABS 0.5 10/04/2015 0845   EOSABS 0.2 05/27/2017 1224   BASOSABS 0.0 05/27/2017 1224   Iron/TIBC/Ferritin/ %Sat No results found for: IRON, TIBC, FERRITIN, IRONPCTSAT Lipid Panel     Component Value Date/Time   CHOL 195 05/14/2017 1007   TRIG 58 05/14/2017 1007   HDL 95 05/14/2017 1007   CHOLHDL 2.1 05/14/2017 1007   CHOLHDL 2 04/08/2017 0935   VLDL 11.8 04/08/2017 0935   LDLCALC 88 05/14/2017 1007   LDLDIRECT 95.9 10/06/2012 1248   Hepatic Function Panel     Component Value Date/Time   PROT 6.6 05/14/2017 1007   ALBUMIN 4.3 05/14/2017 1007   AST 19 05/14/2017 1007   ALT 19 05/14/2017 1007   ALKPHOS 78 05/14/2017 1007   BILITOT 1.0 05/14/2017 1007   BILIDIR 0.25 05/14/2017 1007   IBILI 0.9 11/15/2015 0831      Component Value Date/Time   TSH 1.250 05/27/2017 1224   TSH 1.27 12/28/2016   TSH 1.00 01/03/2015   TSH 1.61 08/07/2007 0938   TSH 1.08 01/10/2007 0000    ASSESSMENT AND PLAN: Type 2 diabetes mellitus without complication, without long-term current use of insulin (HCC) - Plan: metFORMIN (GLUCOPHAGE-XR) 500 MG 24 hr tablet  Other hyperlipidemia  Class 1 obesity with  serious comorbidity and body mass index (BMI) of 31.0 to 31.9 in adult, unspecified obesity type  PLAN:  Diabetes II Sierra Ford has been given extensive diabetes education by myself today including ideal fasting and post-prandial blood glucose readings, individual ideal Hgb A1c goals and hypoglycemia prevention. We discussed the importance of good blood sugar control to decrease the likelihood of diabetic complications such as nephropathy, neuropathy, limb loss, blindness, coronary artery disease, and death. We discussed the importance of intensive lifestyle modification including diet, exercise and weight loss as the first line treatment for diabetes. Sierra Ford agrees to continue taking metformin 500 mg PO q AM #90 and we will refill for 1 month (90 days supply). Sierra Ford agrees to follow up with our clinic in 2 weeks.  Hyperlipidemia Sierra Ford was informed of the American Heart Association Guidelines emphasizing intensive lifestyle modifications as the first line treatment for hyperlipidemia. We discussed many lifestyle modifications today in depth, and Sierra Ford will continue to work on decreasing saturated fats such as fatty red meat, butter and many fried foods. She will also increase vegetables and lean protein in her diet and continue to work on exercise and weight loss efforts. Sierra Ford agrees to continue taking statin and Zetia and she agrees to follow up with our clinic in 2 weeks.  Obesity Sierra Ford is currently in the action stage of change. As such, her goal is to continue with weight loss efforts She has agreed to follow the  Category 2 plan + 100 calories, add 1/2 cottage cheese + 1 egg Sierra Ford has been instructed to work up to a goal of 150 minutes of combined cardio and strengthening exercise per week for weight loss and overall health benefits. We discussed the following Behavioral Modification Strategies today: increasing lean protein intake, increasing vegetables, work on meal planning and easy cooking plans,  and planning for success   Sierra Ford has agreed to follow up with our clinic in 2 weeks. She was informed of the importance of frequent follow up visits to maximize her success with intensive lifestyle modifications for her multiple health conditions.   OBESITY BEHAVIORAL INTERVENTION VISIT  Today's visit was # 3 out of 22.  Starting weight: 193 lbs Starting date: 05/27/17 Today's weight : 178 lbs  Today's date: 06/26/2017 Total lbs lost to date: 79 (Patients must lose 7 lbs in the first 6 months to continue with counseling)   ASK: We discussed the diagnosis of obesity with Sierra Ford today and Sierra Ford agreed to give [name] permission to discuss obesity behavioral modification therapy today.  ASSESS: Sierra Ford has the diagnosis of obesity and her BMI today is 31.54 Sierra Ford is in the action stage of change   ADVISE: Sierra Ford was educated on the multiple health risks of obesity as well as the benefit of weight loss to improve her health. She was advised of the need for long term treatment and the importance of lifestyle modifications.  AGREE: Multiple dietary modification options and treatment options were discussed and  Sierra Ford agreed to the above obesity treatment plan.  I, Trixie Dredge, am acting as transcriptionist for Ilene Qua, MD  I have reviewed the above documentation for accuracy and completeness, and I agree with the above. - Ilene Qua, MD

## 2017-07-01 ENCOUNTER — Other Ambulatory Visit: Payer: Self-pay | Admitting: Family Medicine

## 2017-07-03 ENCOUNTER — Other Ambulatory Visit: Payer: Self-pay

## 2017-07-03 ENCOUNTER — Ambulatory Visit (AMBULATORY_SURGERY_CENTER): Payer: Self-pay | Admitting: *Deleted

## 2017-07-03 ENCOUNTER — Telehealth (INDEPENDENT_AMBULATORY_CARE_PROVIDER_SITE_OTHER): Payer: Self-pay | Admitting: Family Medicine

## 2017-07-03 VITALS — Ht 63.0 in | Wt 183.0 lb

## 2017-07-03 DIAGNOSIS — Z1211 Encounter for screening for malignant neoplasm of colon: Secondary | ICD-10-CM

## 2017-07-03 MED ORDER — NA SULFATE-K SULFATE-MG SULF 17.5-3.13-1.6 GM/177ML PO SOLN: ORAL | 0 refills | Status: DC

## 2017-07-03 NOTE — Telephone Encounter (Signed)
L/m for pt to return call

## 2017-07-03 NOTE — Progress Notes (Signed)
Patient denies any allergies to eggs or soy. Patient denies any problems with anesthesia/sedation. Patient denies any oxygen use at home. Patient denies taking any diet/weight loss medications or blood thinners. EMMI education assisgned to patient on colonoscopy, this was explained and instructions given to patient. 

## 2017-07-03 NOTE — Telephone Encounter (Signed)
Sierra Ford is requesting that her Rx for Metformin be resent to [facility].  She stated it takes about 7 days to receive and that she will be out of the medication by the time it arrives. Thank you [name]

## 2017-07-04 ENCOUNTER — Other Ambulatory Visit (INDEPENDENT_AMBULATORY_CARE_PROVIDER_SITE_OTHER): Payer: Self-pay

## 2017-07-04 DIAGNOSIS — E1165 Type 2 diabetes mellitus with hyperglycemia: Principal | ICD-10-CM

## 2017-07-04 DIAGNOSIS — IMO0002 Reserved for concepts with insufficient information to code with codable children: Secondary | ICD-10-CM

## 2017-07-04 DIAGNOSIS — E1151 Type 2 diabetes mellitus with diabetic peripheral angiopathy without gangrene: Secondary | ICD-10-CM

## 2017-07-04 MED ORDER — METFORMIN HCL 500 MG PO TABS: 500.0000 mg | ORAL_TABLET | Freq: Every day | ORAL | 0 refills | Status: DC

## 2017-07-04 NOTE — Telephone Encounter (Signed)
Spoke with [employer] pt only has part B and medications are not covered under this plan. Called pt to make aware we would need to send to local pharmacy for her secondary insurance to cover she verbalized understanding medication sent to [facility] per pt request.

## 2017-07-11 ENCOUNTER — Ambulatory Visit (INDEPENDENT_AMBULATORY_CARE_PROVIDER_SITE_OTHER): Payer: Medicare PPO | Admitting: Family Medicine

## 2017-07-11 ENCOUNTER — Encounter: Payer: Self-pay | Admitting: Family Medicine

## 2017-07-11 VITALS — BP 122/66 | HR 66 | Temp 97.6°F | Resp 16 | Ht 63.0 in | Wt 183.0 lb

## 2017-07-11 DIAGNOSIS — E785 Hyperlipidemia, unspecified: Secondary | ICD-10-CM

## 2017-07-11 DIAGNOSIS — I5032 Chronic diastolic (congestive) heart failure: Secondary | ICD-10-CM | POA: Diagnosis not present

## 2017-07-11 DIAGNOSIS — IMO0002 Reserved for concepts with insufficient information to code with codable children: Secondary | ICD-10-CM

## 2017-07-11 DIAGNOSIS — I10 Essential (primary) hypertension: Secondary | ICD-10-CM

## 2017-07-11 DIAGNOSIS — E1165 Type 2 diabetes mellitus with hyperglycemia: Secondary | ICD-10-CM | POA: Diagnosis not present

## 2017-07-11 DIAGNOSIS — E119 Type 2 diabetes mellitus without complications: Secondary | ICD-10-CM

## 2017-07-11 DIAGNOSIS — E1151 Type 2 diabetes mellitus with diabetic peripheral angiopathy without gangrene: Secondary | ICD-10-CM | POA: Diagnosis not present

## 2017-07-11 LAB — COMPREHENSIVE METABOLIC PANEL
ALK PHOS: 70 U/L (ref 39–117)
ALT: 21 U/L (ref 0–35)
AST: 20 U/L (ref 0–37)
Albumin: 4.3 g/dL (ref 3.5–5.2)
BILIRUBIN TOTAL: 1.2 mg/dL (ref 0.2–1.2)
BUN: 27 mg/dL — ABNORMAL HIGH (ref 6–23)
CALCIUM: 9.9 mg/dL (ref 8.4–10.5)
CO2: 30 mEq/L (ref 19–32)
Chloride: 101 mEq/L (ref 96–112)
Creatinine, Ser: 0.92 mg/dL (ref 0.40–1.20)
GFR: 76.37 mL/min (ref >60.00)
GLUCOSE: 107 mg/dL — AB (ref 70–99)
Potassium: 3.8 mEq/L (ref 3.5–5.1)
Sodium: 140 mEq/L (ref 135–145)
TOTAL PROTEIN: 7 g/dL (ref 6.0–8.3)

## 2017-07-11 LAB — LIPID PANEL
CHOLESTEROL: 193 mg/dL (ref 0–200)
HDL: 79.4 mg/dL (ref >39.00)
LDL Cholesterol: 90 mg/dL (ref 0–99)
NONHDL: 113.39
TRIGLYCERIDES: 116 mg/dL (ref 0.0–149.0)
Total CHOL/HDL Ratio: 2
VLDL: 23.2 mg/dL (ref 0.0–40.0)

## 2017-07-11 LAB — HEMOGLOBIN A1C: Hgb A1c MFr Bld: 6.8 % — ABNORMAL HIGH (ref 4.6–6.5)

## 2017-07-11 MED ORDER — METFORMIN HCL ER 500 MG PO TB24: 500.0000 mg | ORAL_TABLET | Freq: Every day | ORAL | 1 refills | Status: DC

## 2017-07-11 NOTE — Assessment & Plan Note (Addendum)
Well controlled, no changes to meds. Encouraged heart healthy diet such as the DASH diet and exercise as tolerated.  Pt in healthy weight and wellness program

## 2017-07-11 NOTE — Assessment & Plan Note (Signed)
Per cardiology 

## 2017-07-11 NOTE — Assessment & Plan Note (Signed)
Tolerating statin, encouraged heart healthy diet, avoid trans fats, minimize simple carbs and saturated fats. Increase exercise as tolerated 

## 2017-07-11 NOTE — Progress Notes (Signed)
Patient ID: Sierra Ford, female   DOB: [birth year], 77 y.o.   MRN: [id number]     Subjective:  I acted as a Education administrator for Dr. Carollee Herter.  [name], [place]   Patient ID: Sierra Ford, female    DOB: Aug 23, 1940, 77 y.o.   MRN: [id number]  Chief Complaint  Patient presents with  . Hypertension  . Diabetes    HPI  Patient is in today for follow up blood pressure and diabetes.    Patient Care Team: Carollee Herter, Alferd Apa, DO as PCP - General Maisie Fus, MD as Consulting Physician (Obstetrics and Gynecology) Minus Breeding, MD as Consulting Physician (Cardiology) Collene Gobble, MD as Consulting Physician (Pulmonary Disease)   Past Medical History:  Diagnosis Date  . Anxiety   . Back pain   . Chest pain    CLite with apical ischemia in 2006 - normal coronary arteries by [facility] in 12/2004;  Myoview 11/12:  Low risk stress nuclear study with a small, partially reversible apical defect most likely related to apical thinning; cannot R/O very mild apical ischemia.  EF: 75%   . Decreased hearing   . Depression   . Diabetes ([place])   . DVT (deep venous thrombosis) ([place]) 2006   hx of  . Easy bruising   . Ectopic pregnancy with intrauterine pregnancy   . Hiatal hernia   . Hx of blood clots   . Hyperlipidemia   . Joint pain   . Low back pain   . Muscle pain   . PE (pulmonary embolism) 2006   hx of  . Pre-diabetes   . Right shoulder pain   . Shortness of breath   . Sinus congestion   . Sleep apnea    CPAP   . Sprained ankle   . Swelling of ankle   . Swelling of extremity   . UTI (lower urinary tract infection)    hx    Past Surgical History:  Procedure Laterality Date  . ABDOMINAL EXPLORATION SURGERY    . BUNIONECTOMY     right  . COLONOSCOPY  2009  . ECTOPIC PREGNANCY SURGERY    . FOOT SURGERY    . TUBAL LIGATION      Family History  Problem Relation Age of Onset  . Heart failure Mother        died from  . Colon polyps Mother   . Hypertension Mother   .  Sudden death Mother   . Obesity Mother   . Alcoholism Father   . Diabetes Maternal Museum/gallery exhibitions officer  . Diabetes Maternal Aunt   . Colon cancer Neg Hx   . Esophageal cancer Neg Hx   . Stomach cancer Neg Hx     Social History   Socioeconomic History  . Marital status: Divorced    Spouse name: Not on file  . Number of children: Not on file  . Years of education: Not on file  . Highest education level: Not on file  Occupational History  . Occupation: Retired Scientist, research (medical)  Social Needs  . Financial resource strain: Not on file  . Food insecurity:    Worry: Not on file    Inability: Not on file  . Transportation needs:    Medical: Not on file    Non-medical: Not on file  Tobacco Use  . Smoking status: Former Smoker    Packs/day: 1.00    Years: 50.00    Pack years: 50.00  Types: Cigarettes    Last attempt to quit: 11/21/2009    Years since quitting: 7.6  . Smokeless tobacco: Never Used  Substance and Sexual Activity  . Alcohol use: Not Currently    Comment: rare 3 times a year per pt  . Drug use: No  . Sexual activity: Not Currently    Partners: Male  Lifestyle  . Physical activity:    Days per week: Not on file    Minutes per session: Not on file  . Stress: Not on file  Relationships  . Social connections:    Talks on phone: Not on file    Gets together: Not on file    Attends religious service: Not on file    Active member of club or organization: Not on file    Attends meetings of clubs or organizations: Not on file    Relationship status: Not on file  . Intimate partner violence:    Fear of current or ex partner: Not on file    Emotionally abused: Not on file    Physically abused: Not on file    Forced sexual activity: Not on file  Other Topics Concern  . Not on file  Social History Narrative   Regular exercise          Outpatient Medications Prior to Visit  Medication Sig Dispense Refill  . aspirin EC 81 MG tablet Take 81 mg by mouth at  bedtime.      . blood glucose meter kit and supplies KIT Dispense based on patient and insurance preference. Use up to four times daily as directed. (FOR ICD-9 250.00, 250.01). 1 each 0  . CALCIUM-VITAMIN D PO Take 600 Units by mouth 2 (two) times daily.      . cholecalciferol (VITAMIN D-400) 400 UNITS TABS Take 400 Units by mouth 2 (two) times daily.     . Coenzyme Q10 100 MG TABS Take 100 mg by mouth daily.    Marland Kitchen ezetimibe (ZETIA) 10 MG tablet TAKE 1 TABLET BY MOUTH ONCE DAILY 90 tablet 2  . Flaxseed, Linseed, (FLAXSEED OIL PO) Take by mouth.    . furosemide (LASIX) 40 MG tablet TAKE ONE TABLET BY MOUTH ONCE DAILY 90 tablet 3  . metFORMIN (GLUCOPHAGE) 500 MG tablet Take 1 tablet (500 mg total) by mouth daily with breakfast. 30 tablet 0  . Multiple Vitamin (MULTIVITAMIN PO) Take 1 tablet by mouth at bedtime.     . Na Sulfate-K Sulfate-Mg Sulf 17.5-3.13-1.6 GM/177ML SOLN Suprep (no substitutions)-TAKE AS DIRECTED. 354 mL 0  . Omega-3 Fatty Acids (FISH OIL PO) Take by mouth.    Marland Kitchen POTASSIUM CHLORIDE PO Take 1 tablet by mouth as needed.    . rosuvastatin (CRESTOR) 10 MG tablet Take 10 mg by mouth daily.    . TRUE METRIX BLOOD GLUCOSE TEST test strip TEST  UP  TO FOUR TIMES DAILY AS DIRECTED 100 each 1  . TRUEPLUS LANCETS 33G MISC USE UP TO FOUR TIMES DAILY AS DIRECTED 100 each 1   No facility-administered medications prior to visit.     No Known Allergies  Review of Systems  Constitutional: Negative for chills, fever and malaise/fatigue.  HENT: Negative for congestion and hearing loss.   Eyes: Negative for discharge.  Respiratory: Negative for cough, sputum production and shortness of breath.   Cardiovascular: Negative for chest pain, palpitations and leg swelling.  Gastrointestinal: Negative for abdominal pain, blood in stool, constipation, diarrhea, heartburn, nausea and vomiting.  Genitourinary: Negative for dysuria,  frequency, hematuria and urgency.  Musculoskeletal: Negative for back  pain, falls and myalgias.  Skin: Negative for rash.  Neurological: Negative for dizziness, sensory change, loss of consciousness, weakness and headaches.  Endo/Heme/Allergies: Negative for environmental allergies. Does not bruise/bleed easily.  Psychiatric/Behavioral: Negative for depression and suicidal ideas. The patient is not nervous/anxious and does not have insomnia.        Objective:    Physical Exam  Constitutional: She is oriented to person, place, and time. She appears well-developed and well-nourished.  HENT:  Head: Normocephalic and atraumatic.  Eyes: Conjunctivae and EOM are normal.  Neck: Normal range of motion. Neck supple. No JVD present. Carotid bruit is not present. No thyromegaly present.  Cardiovascular: Normal rate, regular rhythm and normal heart sounds.  No murmur heard. Pulmonary/Chest: Effort normal and breath sounds normal. No respiratory distress. She has no wheezes. She has no rales. She exhibits no tenderness.  Musculoskeletal: She exhibits no edema.  Neurological: She is alert and oriented to person, place, and time.  Psychiatric: She has a normal mood and affect.  Nursing note and vitals reviewed.  Diabetic Foot Exam - Simple   No data filed       BP 122/66 (BP Location: Right Arm, Cuff Size: Normal)   Pulse 66   Temp 97.6 F (36.4 C) (Oral)   Resp 16   Ht '5\' 3"'  (1.6 m)   Wt 183 lb (83 kg)   SpO2 97%   BMI 32.42 kg/m  Wt Readings from Last 3 Encounters:  07/11/17 183 lb (83 kg)  07/03/17 183 lb (83 kg)  06/26/17 178 lb (80.7 kg)   BP Readings from Last 3 Encounters:  07/11/17 122/66  06/26/17 117/80  06/10/17 113/71     Immunization History  Administered Date(s) Administered  . Pneumococcal Conjugate-13 04/24/2013  . Pneumococcal Polysaccharide-23 08/07/2007  . Td 03/03/2003  . Zoster 08/14/2007, 11/13/2007    Health Maintenance  Topic Date Due  . OPHTHALMOLOGY EXAM  09/18/1950  . TETANUS/TDAP  03/02/2013  . MAMMOGRAM   11/20/2015  . INFLUENZA VACCINE  09/19/2017  . HEMOGLOBIN A1C  10/06/2017  . COLONOSCOPY  10/22/2017  . URINE MICROALBUMIN  04/08/2018  . FOOT EXAM  07/12/2018  . DEXA SCAN  Completed  . PNA vac Low Risk Adult  Completed    Lab Results  Component Value Date   WBC 4.7 05/27/2017   HGB 14.4 05/27/2017   HCT 42.2 05/27/2017   PLT 221 05/27/2017   GLUCOSE 123 (H) 04/08/2017   CHOL 195 05/14/2017   TRIG 58 05/14/2017   HDL 95 05/14/2017   LDLDIRECT 95.9 10/06/2012   LDLCALC 88 05/14/2017   ALT 19 05/14/2017   AST 19 05/14/2017   NA 139 04/08/2017   K 4.4 04/08/2017   CL 104 04/08/2017   CREATININE 0.81 04/08/2017   BUN 16 04/08/2017   CO2 28 04/08/2017   TSH 1.250 05/27/2017   INR 1.03 04/20/2009   HGBA1C 6.8 (H) 04/08/2017   MICROALBUR 1.1 04/08/2017    Lab Results  Component Value Date   TSH 1.250 05/27/2017   Lab Results  Component Value Date   WBC 4.7 05/27/2017   HGB 14.4 05/27/2017   HCT 42.2 05/27/2017   MCV 90 05/27/2017   PLT 221 05/27/2017   Lab Results  Component Value Date   NA 139 04/08/2017   K 4.4 04/08/2017   CO2 28 04/08/2017   GLUCOSE 123 (H) 04/08/2017   BUN 16 04/08/2017  CREATININE 0.81 04/08/2017   BILITOT 1.0 05/14/2017   ALKPHOS 78 05/14/2017   AST 19 05/14/2017   ALT 19 05/14/2017   PROT 6.6 05/14/2017   ALBUMIN 4.3 05/14/2017   CALCIUM 9.5 04/08/2017   GFR 88.52 04/08/2017   Lab Results  Component Value Date   CHOL 195 05/14/2017   Lab Results  Component Value Date   HDL 95 05/14/2017   Lab Results  Component Value Date   LDLCALC 88 05/14/2017   Lab Results  Component Value Date   TRIG 58 05/14/2017   Lab Results  Component Value Date   CHOLHDL 2.1 05/14/2017   Lab Results  Component Value Date   HGBA1C 6.8 (H) 04/08/2017         Assessment & Plan:   Problem List Items Addressed This Visit      Unprioritized   Chronic diastolic heart failure ([place])    Per cardiology      DM (diabetes mellitus)  type II uncontrolled, periph vascular disorder ([place])    hgba1c to be checkedminimize simple carbs. Increase exercise as tolerated. Continue current meds Pt metformin xr changed to immediate release by another office rx fixed Check labs today       Relevant Medications   metFORMIN (GLUCOPHAGE XR) 500 MG 24 hr tablet   Essential hypertension    Well controlled, no changes to meds. Encouraged heart healthy diet such as the DASH diet and exercise as tolerated.  Pt in healthy weight and wellness program      Hyperlipidemia LDL goal <70    Tolerating statin, encouraged heart healthy diet, avoid trans fats, minimize simple carbs and saturated fats. Increase exercise as tolerated      Relevant Orders   Lipid panel    Other Visit Diagnoses    Diabetes mellitus without complication ([place])    -  Primary   Relevant Medications   metFORMIN (GLUCOPHAGE XR) 500 MG 24 hr tablet   Other Relevant Orders   Hemoglobin A1c   Comprehensive metabolic panel      I am having Fendi B. Degner start on metFORMIN. I am also having her maintain her cholecalciferol, CALCIUM-VITAMIN D PO, Multiple Vitamin (MULTIVITAMIN PO), aspirin EC, Coenzyme Q10, (Flaxseed, Linseed, (FLAXSEED OIL PO)), Omega-3 Fatty Acids (FISH OIL PO), furosemide, ezetimibe, blood glucose meter kit and supplies, rosuvastatin, TRUEPLUS LANCETS 33G, TRUE METRIX BLOOD GLUCOSE TEST, POTASSIUM CHLORIDE PO, Na Sulfate-K Sulfate-Mg Sulf, and metFORMIN.  Meds ordered this encounter  Medications  . metFORMIN (GLUCOPHAGE XR) 500 MG 24 hr tablet    Sig: Take 1 tablet (500 mg total) by mouth daily with breakfast.    Dispense:  90 tablet    Refill:  1    CMA served as scribe during this visit. History, Physical and Plan performed by medical provider. Documentation and orders reviewed and attested to.  Ann Held, DO

## 2017-07-11 NOTE — Assessment & Plan Note (Signed)
hgba1c to be checkedminimize simple carbs. Increase exercise as tolerated. Continue current meds Pt metformin xr changed to immediate release by another office rx fixed Check labs today

## 2017-07-11 NOTE — Patient Instructions (Signed)

## 2017-07-12 ENCOUNTER — Other Ambulatory Visit: Payer: Self-pay | Admitting: Family Medicine

## 2017-07-16 ENCOUNTER — Telehealth: Payer: Self-pay | Admitting: Gastroenterology

## 2017-07-16 ENCOUNTER — Ambulatory Visit (INDEPENDENT_AMBULATORY_CARE_PROVIDER_SITE_OTHER): Payer: Medicare PPO | Admitting: Family Medicine

## 2017-07-16 VITALS — BP 103/67 | HR 61 | Temp 97.8°F | Ht 63.0 in | Wt 174.0 lb

## 2017-07-16 DIAGNOSIS — E7849 Other hyperlipidemia: Secondary | ICD-10-CM | POA: Diagnosis not present

## 2017-07-16 DIAGNOSIS — E119 Type 2 diabetes mellitus without complications: Secondary | ICD-10-CM | POA: Diagnosis not present

## 2017-07-16 DIAGNOSIS — E669 Obesity, unspecified: Secondary | ICD-10-CM

## 2017-07-16 DIAGNOSIS — Z6830 Body mass index (BMI) 30.0-30.9, adult: Secondary | ICD-10-CM | POA: Diagnosis not present

## 2017-07-16 NOTE — Progress Notes (Signed)
Office: [phone]  /  Fax: [phone]   HPI:   Chief Complaint: OBESITY Sierra Ford is here to discuss her progress with her obesity treatment plan. Sierra Ford is on the Category 2 plan +100 calories and add 1/2 cup cottage cheese and 1 egg and Sierra Ford is following her eating plan approximately 85 % of the time. Sierra Ford states Sierra Ford is doing water aerobics for 45 minutes 5 to 6 times per week. Sierra Ford is still enjoying the meal plan. Sierra Ford is finding that Sierra Ford is getting a little bored. Occasionally Sierra Ford is feeling full, so Sierra Ford is not eating all the food and has noticed a slight increase in hunger. Her weight is 174 lb (78.9 kg) today and has had a weight loss of 4 pounds over a period of 3 weeks since her last visit. Sierra Ford has lost 19 lbs since starting treatment with [name].  Diabetes II Sierra Ford has a diagnosis of diabetes type II. Sierra Ford is on metformin 2 times daily and last A1c was at 6.8  Sierra Ford has been working on intensive lifestyle modifications including diet, exercise, and weight loss to help control her blood glucose levels. Sierra Ford denies hypoglycemia.  Hyperlipidemia Sierra Ford has hyperlipidemia and Sierra Ford is on statin and Zetia. Sierra Ford has been trying to improve her cholesterol levels with intensive lifestyle modification including a low saturated fat diet, exercise and weight loss. Sierra Ford denies any myalgias.  ALLERGIES: No Known Allergies  MEDICATIONS: Current Outpatient Medications on File Prior to Visit  Medication Sig Dispense Refill  . aspirin EC 81 MG tablet Take 81 mg by mouth at bedtime.      . blood glucose meter kit and supplies KIT Dispense based on patient and insurance preference. Use up to four times daily as directed. (FOR ICD-9 250.00, 250.01). 1 each 0  . CALCIUM-VITAMIN D PO Take 600 Units by mouth 2 (two) times daily.      . cholecalciferol (VITAMIN D-400) 400 UNITS TABS Take 400 Units by mouth 2 (two) times daily.     . Coenzyme Q10 100 MG TABS Take 100 mg by mouth daily.    Sierra Ford Kitchen ezetimibe (ZETIA) 10  MG tablet TAKE 1 TABLET BY MOUTH ONCE DAILY 90 tablet 2  . Flaxseed, Linseed, (FLAXSEED OIL PO) Take by mouth.    . furosemide (LASIX) 40 MG tablet TAKE ONE TABLET BY MOUTH ONCE DAILY 90 tablet 3  . metFORMIN (GLUCOPHAGE XR) 500 MG 24 hr tablet Take 1 tablet (500 mg total) by mouth daily with breakfast. 90 tablet 1  . Multiple Vitamin (MULTIVITAMIN PO) Take 1 tablet by mouth at bedtime.     . Na Sulfate-K Sulfate-Mg Sulf 17.5-3.13-1.6 GM/177ML SOLN Suprep (no substitutions)-TAKE AS DIRECTED. 354 mL 0  . Omega-3 Fatty Acids (FISH OIL PO) Take by mouth.    Sierra Ford Kitchen POTASSIUM CHLORIDE PO Take 1 tablet by mouth as needed.    . rosuvastatin (CRESTOR) 10 MG tablet Take 10 mg by mouth daily.    . TRUE METRIX BLOOD GLUCOSE TEST test strip TEST  UP  TO FOUR TIMES DAILY AS DIRECTED 100 each 1  . TRUEPLUS LANCETS 33G MISC USE UP TO FOUR TIMES DAILY AS DIRECTED 100 each 1  . [DISCONTINUED] omega-3 acid ethyl esters (LOVAZA) 1 G capsule Take 1 g by mouth 3 (three) times daily.      . [DISCONTINUED] pravastatin (PRAVACHOL) 20 MG tablet Take 20 mg by mouth daily.       No current facility-administered medications on file prior to visit.  PAST MEDICAL HISTORY: Past Medical History:  Diagnosis Date  . Anxiety   . Back pain   . Chest pain    CLite with apical ischemia in 2006 - normal coronary arteries by [facility] in 12/2004;  Myoview 11/12:  Low risk stress nuclear study with a small, partially reversible apical defect most likely related to apical thinning; cannot R/O very mild apical ischemia.  EF: 75%   . Decreased hearing   . Depression   . Diabetes (Sierra Ford)   . DVT (deep venous thrombosis) ([place]) 2006   hx of  . Easy bruising   . Ectopic pregnancy with intrauterine pregnancy   . Hiatal hernia   . Hx of blood clots   . Hyperlipidemia   . Joint pain   . Low back pain   . Muscle pain   . PE (pulmonary embolism) 2006   hx of  . Pre-diabetes   . Right shoulder pain   . Shortness of breath   . Sinus  congestion   . Sleep apnea    CPAP   . Sprained ankle   . Swelling of ankle   . Swelling of extremity   . UTI (lower urinary tract infection)    hx    PAST SURGICAL HISTORY: Past Surgical History:  Procedure Laterality Date  . ABDOMINAL EXPLORATION SURGERY    . BUNIONECTOMY     right  . COLONOSCOPY  2009  . ECTOPIC PREGNANCY SURGERY    . FOOT SURGERY    . TUBAL LIGATION      SOCIAL HISTORY: Social History   Tobacco Use  . Smoking status: Former Smoker    Packs/day: 1.00    Years: 50.00    Pack years: 50.00    Types: Cigarettes    Last attempt to quit: 11/21/2009    Years since quitting: 7.6  . Smokeless tobacco: Never Used  Substance Use Topics  . Alcohol use: Not Currently    Comment: rare 3 times a year per pt  . Drug use: No    FAMILY HISTORY: Family History  Problem Relation Age of Onset  . Heart failure Mother        died from  . Colon polyps Mother   . Hypertension Mother   . Sudden death Mother   . Obesity Mother   . Alcoholism Father   . Diabetes Maternal Museum/gallery exhibitions officer  . Diabetes Maternal Aunt   . Colon cancer Neg Hx   . Esophageal cancer Neg Hx   . Stomach cancer Neg Hx     ROS: Review of Systems  Constitutional: Positive for weight loss.  Musculoskeletal: Negative for myalgias.  Endo/Heme/Allergies:       Negative for hypoglycemia    PHYSICAL EXAM: Blood pressure 103/67, pulse 61, temperature 97.8 F (36.6 C), temperature source Oral, height '5\' 3"'  (1.6 m), weight 174 lb (78.9 kg), SpO2 95 %. Body mass index is 30.82 kg/m. Physical Exam  Constitutional: Sierra Ford is oriented to person, place, and time. Sierra Ford appears well-developed and well-nourished.  Cardiovascular: Normal rate.  Pulmonary/Chest: Effort normal.  Musculoskeletal: Normal range of motion.  Neurological: Sierra Ford is oriented to person, place, and time.  Skin: Skin is warm and dry.  Psychiatric: Sierra Ford has a normal mood and affect. Her behavior is normal.  Vitals  reviewed.   RECENT LABS AND TESTS: BMET    Component Value Date/Time   NA 140 07/11/2017 0908   NA 142 12/28/2016   K  3.8 07/11/2017 0908   CL 101 07/11/2017 0908   CO2 30 07/11/2017 0908   GLUCOSE 107 (H) 07/11/2017 0908   BUN 27 (H) 07/11/2017 0908   BUN 23 (A) 12/28/2016   CREATININE 0.92 07/11/2017 0908   CREATININE 0.82 10/04/2015 0845   CALCIUM 9.9 07/11/2017 0908   GFRNONAA 71 10/04/2015 0845   GFRAA 82 10/04/2015 0845   Lab Results  Component Value Date   HGBA1C 6.8 (H) 07/11/2017   HGBA1C 6.8 (H) 04/08/2017   HGBA1C 6.7 12/28/2016   HGBA1C 6.7 (H) 09/27/2016   HGBA1C 6.8 (H) 06/28/2016   Lab Results  Component Value Date   INSULIN 13.7 05/27/2017   CBC    Component Value Date/Time   WBC 4.7 05/27/2017 1224   WBC 6.4 10/04/2015 0845   RBC 4.70 05/27/2017 1224   RBC 4.61 10/04/2015 0845   HGB 14.4 05/27/2017 1224   HCT 42.2 05/27/2017 1224   PLT 221 05/27/2017 1224   MCV 90 05/27/2017 1224   MCH 30.6 05/27/2017 1224   MCH 31.5 01/01/2011 0744   MCHC 34.1 05/27/2017 1224   MCHC 34.4 10/04/2015 0845   RDW 13.7 05/27/2017 1224   LYMPHSABS 2.1 05/27/2017 1224   MONOABS 0.5 10/04/2015 0845   EOSABS 0.2 05/27/2017 1224   BASOSABS 0.0 05/27/2017 1224   Iron/TIBC/Ferritin/ %Sat No results found for: IRON, TIBC, FERRITIN, IRONPCTSAT Lipid Panel     Component Value Date/Time   CHOL 193 07/11/2017 0908   CHOL 195 05/14/2017 1007   TRIG 116.0 07/11/2017 0908   HDL 79.40 07/11/2017 0908   HDL 95 05/14/2017 1007   CHOLHDL 2 07/11/2017 0908   VLDL 23.2 07/11/2017 0908   LDLCALC 90 07/11/2017 0908   LDLCALC 88 05/14/2017 1007   LDLDIRECT 95.9 10/06/2012 1248   Hepatic Function Panel     Component Value Date/Time   PROT 7.0 07/11/2017 0908   PROT 6.6 05/14/2017 1007   ALBUMIN 4.3 07/11/2017 0908   ALBUMIN 4.3 05/14/2017 1007   AST 20 07/11/2017 0908   ALT 21 07/11/2017 0908   ALKPHOS 70 07/11/2017 0908   BILITOT 1.2 07/11/2017 0908   BILITOT 1.0  05/14/2017 1007   BILIDIR 0.25 05/14/2017 1007   IBILI 0.9 11/15/2015 0831      Component Value Date/Time   TSH 1.250 05/27/2017 1224   TSH 1.27 12/28/2016   TSH 1.00 01/03/2015   TSH 1.61 08/07/2007 0938   TSH 1.08 01/10/2007 0000   Results for JAMIRIA, LANGILL (MRN [id number]) as of 07/16/2017 10:49  Ref. Range 05/27/2017 12:24  Vitamin D, 25-Hydroxy Latest Ref Range: 30.0 - 100.0 ng/mL 45.9   ASSESSMENT AND PLAN: Type 2 diabetes mellitus without complication, without long-term current use of insulin (HCC)  Other hyperlipidemia  Class 1 obesity with serious comorbidity and body mass index (BMI) of 30.0 to 30.9 in adult, unspecified obesity type  PLAN:  Diabetes II Danyia has been given extensive diabetes education by myself today including ideal fasting and post-prandial blood glucose readings, individual ideal Hgb A1c goals and hypoglycemia prevention. We discussed the importance of good blood sugar control to decrease the likelihood of diabetic complications such as nephropathy, neuropathy, limb loss, blindness, coronary artery disease, and death. We discussed the importance of intensive lifestyle modification including diet, exercise and weight loss as the first line treatment for diabetes. Shiela will hold metformin today for colonoscopy; and will resume today. Ferrah will follow up at the agreed upon time.  Hyperlipidemia Amra was informed of  the American Heart Association Guidelines emphasizing intensive lifestyle modifications as the first line treatment for hyperlipidemia. We discussed many lifestyle modifications today in depth, and Hayli will continue to work on decreasing saturated fats such as fatty red meat, butter and many fried foods. Sierra Ford will also increase vegetables and lean protein in her diet and continue to work on exercise and weight loss efforts. Alana will continue statin and we will repeat labs in 2 months.  We spent > than 50% of the 15 minute visit on the  counseling as documented in the note.  Obesity Catelynn is currently in the action stage of change. As such, her goal is to continue with weight loss efforts Sierra Ford has agreed to keep a food journal with 400 to 500 calories and 35+ grams of protein at supper daily and follow the Category 2 plan Daveah has been instructed to work up to a goal of 150 minutes of combined cardio and strengthening exercise per week for weight loss and overall health benefits. We discussed the following Behavioral Modification Strategies today: planning for success, keep a strict food journal, increasing lean protein intake, increasing vegetables and work on meal planning and easy cooking plans  Akeela has agreed to follow up with our clinic in 2 to 3 weeks. Sierra Ford was informed of the importance of frequent follow up visits to maximize her success with intensive lifestyle modifications for her multiple health conditions.   OBESITY BEHAVIORAL INTERVENTION VISIT  Today's visit was # 4 out of 22.  Starting weight: 193 lbs Starting date: 05/27/17 Today's weight : 174 lbs  Today's date: 07/16/2017 Total lbs lost to date: 69 (Patients must lose 7 lbs in the first 6 months to continue with counseling)   ASK: We discussed the diagnosis of obesity with [name] today and Tanayah agreed to give [name] permission to discuss obesity behavioral modification therapy today.  ASSESS: Jadda has the diagnosis of obesity and her BMI today is 30.83 Ladawn is in the action stage of change   ADVISE: Denaisha was educated on the multiple health risks of obesity as well as the benefit of weight loss to improve her health. Sierra Ford was advised of the need for long term treatment and the importance of lifestyle modifications.  AGREE: Multiple dietary modification options and treatment options were discussed and  Najma agreed to the above obesity treatment plan.  I, Doreene Nest, am acting as transcriptionist for Eber Jones, MD  I have  reviewed the above documentation for accuracy and completeness, and I agree with the above. - Ilene Qua, MD

## 2017-07-16 NOTE — Telephone Encounter (Signed)
Pt wanted to know if she could eat a cracker to take her Metformin- instructed no- she can do jello or chicken broth - instructed to not take Metformin tomorrow morning- pt verbalized understanding   Sierra Ford PV

## 2017-07-17 ENCOUNTER — Ambulatory Visit (AMBULATORY_SURGERY_CENTER): Payer: Medicare PPO | Admitting: Gastroenterology

## 2017-07-17 ENCOUNTER — Other Ambulatory Visit: Payer: Self-pay

## 2017-07-17 ENCOUNTER — Encounter: Payer: Self-pay | Admitting: Gastroenterology

## 2017-07-17 VITALS — BP 109/59 | HR 54 | Temp 97.7°F | Resp 18 | Ht 63.0 in | Wt 183.0 lb

## 2017-07-17 DIAGNOSIS — E119 Type 2 diabetes mellitus without complications: Secondary | ICD-10-CM | POA: Diagnosis not present

## 2017-07-17 DIAGNOSIS — I10 Essential (primary) hypertension: Secondary | ICD-10-CM | POA: Diagnosis not present

## 2017-07-17 DIAGNOSIS — Z1211 Encounter for screening for malignant neoplasm of colon: Secondary | ICD-10-CM

## 2017-07-17 DIAGNOSIS — Z86010 Personal history of colonic polyps: Secondary | ICD-10-CM | POA: Diagnosis not present

## 2017-07-17 MED ORDER — SODIUM CHLORIDE 0.9 % IV SOLN: 500.0000 mL | Freq: Once | INTRAVENOUS | Status: DC

## 2017-07-17 NOTE — Patient Instructions (Signed)
**   Handout given on diverticulosis and hemorrhoid banding **   YOU HAD AN ENDOSCOPIC PROCEDURE TODAY AT [facility]:   Refer to the procedure report that was given to you for any specific questions about what was found during the examination.  If the procedure report does not answer your questions, please call your gastroenterologist to clarify.  If you requested that your care partner not be given the details of your procedure findings, then the procedure report has been included in a sealed envelope for you to review at your convenience later.  YOU SHOULD EXPECT: Some feelings of bloating in the abdomen. Passage of more gas than usual.  Walking can help get rid of the air that was put into your GI tract during the procedure and reduce the bloating. If you had a lower endoscopy (such as a colonoscopy or flexible sigmoidoscopy) you may notice spotting of blood in your stool or on the toilet paper. If you underwent a bowel prep for your procedure, you may not have a normal bowel movement for a few days.  Please Note:  You might notice some irritation and congestion in your nose or some drainage.  This is from the oxygen used during your procedure.  There is no need for concern and it should clear up in a day or so.  SYMPTOMS TO REPORT IMMEDIATELY:   Following lower endoscopy (colonoscopy or flexible sigmoidoscopy):  Excessive amounts of blood in the stool  Significant tenderness or worsening of abdominal pains  Swelling of the abdomen that is new, acute  Fever of 100F or higher  For urgent or emergent issues, a gastroenterologist can be reached at any hour by calling [phone].   DIET:  We do recommend a small meal at first, but then you may proceed to your regular diet.  Drink plenty of fluids but you should avoid alcoholic beverages for 24 hours.  ACTIVITY:  You should plan to take it easy for the rest of today and you should NOT DRIVE or use heavy machinery until  tomorrow (because of the sedation medicines used during the test).    FOLLOW UP: Our staff will call the number listed on your records the next business day following your procedure to check on you and address any questions or concerns that you may have regarding the information given to you following your procedure. If we do not reach you, we will leave a message.  However, if you are feeling well and you are not experiencing any problems, there is no need to return our call.  We will assume that you have returned to your regular daily activities without incident.  If any biopsies were taken you will be contacted by phone or by letter within the next 1-3 weeks.  Please call us at [phone] if you have not heard about the biopsies in 3 weeks.    SIGNATURES/CONFIDENTIALITY: You and/or your care partner have signed paperwork which will be entered into your electronic medical record.  These signatures attest to the fact that that the information above on your After Visit Summary has been reviewed and is understood.  Full responsibility of the confidentiality of this discharge information lies with you and/or your care-partner.

## 2017-07-17 NOTE — Op Note (Signed)
[facility] Patient Name: Sierra Ford Procedure Date: 07/17/2017 10:59 AM MRN: [id number] Endoscopist: Mauri Pole , MD Age: 77 Referring MD:  Date of Birth: 03-26-1940 Gender: Female Account #: [id number] Procedure:                Colonoscopy Indications:              Screening for colorectal malignant neoplasm Medicines:                Monitored Anesthesia Care Procedure:                Pre-Anesthesia Assessment:                           - Prior to the procedure, a History and Physical                            was performed, and patient medications and                            allergies were reviewed. The patient's tolerance of                            previous anesthesia was also reviewed. The risks                            and benefits of the procedure and the sedation                            options and risks were discussed with the patient.                            All questions were answered, and informed consent                            was obtained. Prior Anticoagulants: The patient has                            taken no previous anticoagulant or antiplatelet                            agents. ASA Grade Assessment: II - A patient with                            mild systemic disease. After reviewing the risks                            and benefits, the patient was deemed in                            satisfactory condition to undergo the procedure.                           After obtaining informed consent, the colonoscope  was passed under direct vision. Throughout the                            procedure, the patient's blood pressure, pulse, and                            oxygen saturations were monitored continuously. The                            Colonoscope was introduced through the anus and                            advanced to the the cecum, identified by                            appendiceal orifice  and ileocecal valve. The                            colonoscopy was performed without difficulty. The                            patient tolerated the procedure well. The quality                            of the bowel preparation was excellent. The                            ileocecal valve, appendiceal orifice, and rectum                            were photographed. Scope In: 11:11:28 AM Scope Out: 11:23:36 AM Scope Withdrawal Time: 0 hours 6 minutes 55 seconds  Total Procedure Duration: 0 hours 12 minutes 8 seconds  Findings:                 The perianal and digital rectal examinations were                            normal.                           Non-bleeding internal hemorrhoids were found during                            retroflexion. The hemorrhoids were medium-sized.                           Multiple small and large-mouthed diverticula were                            found in the sigmoid colon and descending colon. Complications:            No immediate complications. Estimated Blood Loss:     Estimated blood loss was minimal. Impression:               - Non-bleeding internal hemorrhoids.                           -  Moderate diverticulosis in the sigmoid colon and                            in the descending colon.                           - No specimens collected. Recommendation:           - Patient has a contact number available for                            emergencies. The signs and symptoms of potential                            delayed complications were discussed with the                            patient. Return to normal activities tomorrow.                            Written discharge instructions were provided to the                            patient.                           - Resume previous diet.                           - Continue present medications.                           - No repeat colonoscopy due to age. Mauri Pole, MD 07/17/2017  11:32:02 AM This report has been signed electronically.

## 2017-07-17 NOTE — Progress Notes (Signed)
Report given to PACU, vss 

## 2017-07-18 ENCOUNTER — Telehealth: Payer: Self-pay

## 2017-07-18 ENCOUNTER — Telehealth: Payer: Self-pay | Admitting: *Deleted

## 2017-07-18 NOTE — Telephone Encounter (Signed)
  Follow up Call-  Call back number 07/17/2017  Post procedure Call Back phone  # [phone]  Permission to leave phone message Yes  Some recent data might be hidden     Patient questions:  Do you have a fever, pain , or abdominal swelling? No. Pain Score  0 *  Have you tolerated food without any problems? Yes.    Have you been able to return to your normal activities? Yes.    Do you have any questions about your discharge instructions: Diet   No. Medications  No. Follow up visit  No.  Do you have questions or concerns about your Care? No.  Actions: * If pain score is 4 or above: No action needed, pain <4.

## 2017-07-18 NOTE — Telephone Encounter (Signed)
Received paperwork from [facility] stating patient was participant in [facility] [place] Healthy Brain Study; includes Lab resting, MRI of the brain, and an exam of cognitive status. These results are being sent to you per patient request; forwarded to provider/SLS 05/30

## 2017-08-06 ENCOUNTER — Ambulatory Visit (INDEPENDENT_AMBULATORY_CARE_PROVIDER_SITE_OTHER): Payer: Medicare PPO | Admitting: Family Medicine

## 2017-08-06 VITALS — BP 107/71 | HR 70 | Temp 97.7°F | Ht 63.0 in | Wt 172.0 lb

## 2017-08-06 DIAGNOSIS — E559 Vitamin D deficiency, unspecified: Secondary | ICD-10-CM

## 2017-08-06 DIAGNOSIS — E119 Type 2 diabetes mellitus without complications: Secondary | ICD-10-CM

## 2017-08-06 DIAGNOSIS — Z6830 Body mass index (BMI) 30.0-30.9, adult: Secondary | ICD-10-CM | POA: Diagnosis not present

## 2017-08-06 DIAGNOSIS — E669 Obesity, unspecified: Secondary | ICD-10-CM | POA: Diagnosis not present

## 2017-08-06 NOTE — Progress Notes (Signed)
Office: [phone]  /  Fax: [phone]   HPI:   Chief Complaint: OBESITY Sierra Ford is here to discuss her progress with her obesity treatment plan. She is on the keep a food journal with 400-500 calories and 35+ grams of protein at supper daily and follow the Category 2 plan and is following her eating plan approximately 75 % of the time. She states she is swimming and silver sneakers for 60 minutes 5 times per week. Sierra Ford had a few indulgences with going out to eat and going to [place]. She is going to [place] at the end of the month, will attempt smart choices when eating out. Her weight is 172 lb (78 kg) today and has had a weight loss of 2 pounds over a period of 3 weeks since her last visit. She has lost 21 lbs since starting treatment with [name].  Diabetes II Sierra Ford has a diagnosis of diabetes type II. [name] still notes occasional carbohydrate cravings. She denies any hypoglycemic episodes. Last A1c was 6.8 on 07/11/17. She has been working on intensive lifestyle modifications including diet, exercise, and weight loss to help control her blood glucose levels.  Vitamin D Deficiency Sierra Ford has a diagnosis of vitamin D deficiency. She is on 400 IU BID OTC Vit D and denies nausea, vomiting or muscle weakness.  ALLERGIES: No Known Allergies  MEDICATIONS: Current Outpatient Medications on File Prior to Visit  Medication Sig Dispense Refill  . aspirin EC 81 MG tablet Take 81 mg by mouth at bedtime.      . blood glucose meter kit and supplies KIT Dispense based on patient and insurance preference. Use up to four times daily as directed. (FOR ICD-9 250.00, 250.01). 1 each 0  . CALCIUM-VITAMIN D PO Take 600 Units by mouth 2 (two) times daily.      . cholecalciferol (VITAMIN D-400) 400 UNITS TABS Take 400 Units by mouth 2 (two) times daily.     . Coenzyme Q10 100 MG TABS Take 100 mg by mouth daily.    Marland Kitchen ezetimibe (ZETIA) 10 MG tablet TAKE 1 TABLET BY MOUTH ONCE DAILY 90 tablet 2  . Flaxseed,  Linseed, (FLAXSEED OIL PO) Take by mouth.    . furosemide (LASIX) 40 MG tablet TAKE ONE TABLET BY MOUTH ONCE DAILY 90 tablet 3  . metFORMIN (GLUCOPHAGE XR) 500 MG 24 hr tablet Take 1 tablet (500 mg total) by mouth daily with breakfast. 90 tablet 1  . Multiple Vitamin (MULTIVITAMIN PO) Take 1 tablet by mouth at bedtime.     . Omega-3 Fatty Acids (FISH OIL PO) Take by mouth.    Marland Kitchen POTASSIUM CHLORIDE PO Take 1 tablet by mouth as needed.    . rosuvastatin (CRESTOR) 10 MG tablet Take 10 mg by mouth daily.    . TRUE METRIX BLOOD GLUCOSE TEST test strip TEST  UP  TO FOUR TIMES DAILY AS DIRECTED 100 each 1  . TRUEPLUS LANCETS 33G MISC USE UP TO FOUR TIMES DAILY AS DIRECTED 100 each 1  . [DISCONTINUED] omega-3 acid ethyl esters (LOVAZA) 1 G capsule Take 1 g by mouth 3 (three) times daily.      . [DISCONTINUED] pravastatin (PRAVACHOL) 20 MG tablet Take 20 mg by mouth daily.       Current Facility-Administered Medications on File Prior to Visit  Medication Dose Route Frequency Provider Last Rate Last Dose  . 0.9 %  sodium chloride infusion  500 mL Intravenous Once Nandigam, Venia Minks, MD  PAST MEDICAL HISTORY: Past Medical History:  Diagnosis Date  . Anxiety   . Back pain   . Chest pain    CLite with apical ischemia in 2006 - normal coronary arteries by [facility] in 12/2004;  Myoview 11/12:  Low risk stress nuclear study with a small, partially reversible apical defect most likely related to apical thinning; cannot R/O very mild apical ischemia.  EF: 75%   . Decreased hearing   . Depression   . Diabetes ([place])   . DVT (deep venous thrombosis) ([place]) 2006   hx of  . Easy bruising   . Ectopic pregnancy with intrauterine pregnancy   . Hiatal hernia   . Hx of blood clots   . Hyperlipidemia   . Joint pain   . Low back pain   . Muscle pain   . PE (pulmonary embolism) 2006   hx of  . Pre-diabetes   . Right shoulder pain   . Shortness of breath   . Sinus congestion   . Sleep apnea    CPAP   .  Sprained ankle   . Swelling of ankle   . Swelling of extremity   . UTI (lower urinary tract infection)    hx    PAST SURGICAL HISTORY: Past Surgical History:  Procedure Laterality Date  . ABDOMINAL EXPLORATION SURGERY    . BUNIONECTOMY     right  . COLONOSCOPY  2009  . ECTOPIC PREGNANCY SURGERY    . FOOT SURGERY    . TUBAL LIGATION      SOCIAL HISTORY: Social History   Tobacco Use  . Smoking status: Former Smoker    Packs/day: 1.00    Years: 50.00    Pack years: 50.00    Types: Cigarettes    Last attempt to quit: 11/21/2009    Years since quitting: 7.7  . Smokeless tobacco: Never Used  Substance Use Topics  . Alcohol use: Not Currently    Comment: rare 3 times a year per pt  . Drug use: No    FAMILY HISTORY: Family History  Problem Relation Age of Onset  . Heart failure Mother        died from  . Colon polyps Mother   . Hypertension Mother   . Sudden death Mother   . Obesity Mother   . Alcoholism Father   . Diabetes Maternal Museum/gallery exhibitions officer  . Diabetes Maternal Aunt   . Colon cancer Neg Hx   . Esophageal cancer Neg Hx   . Stomach cancer Neg Hx     ROS: Review of Systems  Constitutional: Positive for weight loss.  Gastrointestinal: Negative for nausea and vomiting.  Musculoskeletal:       Negative muscle weakness  Endo/Heme/Allergies:       Negative hypoglycemia    PHYSICAL EXAM: Blood pressure 107/71, pulse 70, temperature 97.7 F (36.5 C), temperature source Oral, height '5\' 3"'  (1.6 m), weight 172 lb (78 kg), SpO2 97 %. Body mass index is 30.47 kg/m. Physical Exam  Constitutional: She is oriented to person, place, and time. She appears well-developed and well-nourished.  Cardiovascular: Normal rate.  Pulmonary/Chest: Effort normal.  Musculoskeletal: Normal range of motion.  Neurological: She is oriented to person, place, and time.  Skin: Skin is warm and dry.  Psychiatric: She has a normal mood and affect. Her behavior is normal.    Vitals reviewed.   RECENT LABS AND TESTS: BMET    Component Value Date/Time  NA 140 07/11/2017 0908   NA 142 12/28/2016   K 3.8 07/11/2017 0908   CL 101 07/11/2017 0908   CO2 30 07/11/2017 0908   GLUCOSE 107 (H) 07/11/2017 0908   BUN 27 (H) 07/11/2017 0908   BUN 23 (A) 12/28/2016   CREATININE 0.92 07/11/2017 0908   CREATININE 0.82 10/04/2015 0845   CALCIUM 9.9 07/11/2017 0908   GFRNONAA 71 10/04/2015 0845   GFRAA 82 10/04/2015 0845   Lab Results  Component Value Date   HGBA1C 6.8 (H) 07/11/2017   HGBA1C 6.8 (H) 04/08/2017   HGBA1C 6.7 12/28/2016   HGBA1C 6.7 (H) 09/27/2016   HGBA1C 6.8 (H) 06/28/2016   Lab Results  Component Value Date   INSULIN 13.7 05/27/2017   CBC    Component Value Date/Time   WBC 4.7 05/27/2017 1224   WBC 6.4 10/04/2015 0845   RBC 4.70 05/27/2017 1224   RBC 4.61 10/04/2015 0845   HGB 14.4 05/27/2017 1224   HCT 42.2 05/27/2017 1224   PLT 221 05/27/2017 1224   MCV 90 05/27/2017 1224   MCH 30.6 05/27/2017 1224   MCH 31.5 01/01/2011 0744   MCHC 34.1 05/27/2017 1224   MCHC 34.4 10/04/2015 0845   RDW 13.7 05/27/2017 1224   LYMPHSABS 2.1 05/27/2017 1224   MONOABS 0.5 10/04/2015 0845   EOSABS 0.2 05/27/2017 1224   BASOSABS 0.0 05/27/2017 1224   Iron/TIBC/Ferritin/ %Sat No results found for: IRON, TIBC, FERRITIN, IRONPCTSAT Lipid Panel     Component Value Date/Time   CHOL 193 07/11/2017 0908   CHOL 195 05/14/2017 1007   TRIG 116.0 07/11/2017 0908   HDL 79.40 07/11/2017 0908   HDL 95 05/14/2017 1007   CHOLHDL 2 07/11/2017 0908   VLDL 23.2 07/11/2017 0908   LDLCALC 90 07/11/2017 0908   LDLCALC 88 05/14/2017 1007   LDLDIRECT 95.9 10/06/2012 1248   Hepatic Function Panel     Component Value Date/Time   PROT 7.0 07/11/2017 0908   PROT 6.6 05/14/2017 1007   ALBUMIN 4.3 07/11/2017 0908   ALBUMIN 4.3 05/14/2017 1007   AST 20 07/11/2017 0908   ALT 21 07/11/2017 0908   ALKPHOS 70 07/11/2017 0908   BILITOT 1.2 07/11/2017 0908    BILITOT 1.0 05/14/2017 1007   BILIDIR 0.25 05/14/2017 1007   IBILI 0.9 11/15/2015 0831      Component Value Date/Time   TSH 1.250 05/27/2017 1224   TSH 1.27 12/28/2016   TSH 1.00 01/03/2015   TSH 1.61 08/07/2007 0938   TSH 1.08 01/10/2007 0000  Results for RAINY, ROTHMAN (MRN [id number]) as of 08/07/2017 08:40  Ref. Range 05/27/2017 12:24  Vitamin D, 25-Hydroxy Latest Ref Range: 30.0 - 100.0 ng/mL 45.9    ASSESSMENT AND PLAN: Type 2 diabetes mellitus without complication, without long-term current use of insulin (HCC)  Vitamin D deficiency  Class 1 obesity with serious comorbidity and body mass index (BMI) of 30.0 to 30.9 in adult, unspecified obesity type  PLAN:  Diabetes II Tashika has been given extensive diabetes education by myself today including ideal fasting and post-prandial blood glucose readings, individual ideal Hgb A1c goals and hypoglycemia prevention. We discussed the importance of good blood sugar control to decrease the likelihood of diabetic complications such as nephropathy, neuropathy, limb loss, blindness, coronary artery disease, and death. We discussed the importance of intensive lifestyle modification including diet, exercise and weight loss as the first line treatment for diabetes. Loistine agrees to continue metformin and she is trying to make smart choices  on vacation. Donielle agrees to follow up with our clinic in 2 weeks.  Vitamin D Deficiency Eda was informed that low vitamin D levels contributes to fatigue and are associated with obesity, breast, and colon cancer. Aysiah agrees to continue taking OTC Vit D supplement and will follow up for routine testing of vitamin D, at least 2-3 times per year. She was informed of the risk of over-replacement of vitamin D and agrees to not increase her dose unless she discusses this with [name] first. Bryce agrees to follow up with our clinic in 2 weeks.  We spent > than 50% of the 15 minute visit on the counseling as documented  in the note.  Obesity Kaitlen is currently in the action stage of change. As such, her goal is to continue with weight loss efforts She has agreed to follow the Category 2 plan Terrill has been instructed to work up to a goal of 150 minutes of combined cardio and strengthening exercise per week for weight loss and overall health benefits. We discussed the following Behavioral Modification Strategies today: increasing lean protein intake, increasing vegetables, work on meal planning and easy cooking plans, better snacking choices, and planning for success   Nalany has agreed to follow up with our clinic in 2 weeks. She was informed of the importance of frequent follow up visits to maximize her success with intensive lifestyle modifications for her multiple health conditions.   OBESITY BEHAVIORAL INTERVENTION VISIT  Today's visit was # 5 out of 22.  Starting weight: 193 lbs Starting date: 05/27/17 Today's weight : 172 lbs Today's date: 08/06/2017 Total lbs lost to date: 21 (Patients must lose 7 lbs in the first 6 months to continue with counseling)   ASK: We discussed the diagnosis of obesity with [name] today and Musette agreed to give [name] permission to discuss obesity behavioral modification therapy today.  ASSESS: Sukaina has the diagnosis of obesity and her BMI today is 30.48 Zauria is in the action stage of change   ADVISE: Emaly was educated on the multiple health risks of obesity as well as the benefit of weight loss to improve her health. She was advised of the need for long term treatment and the importance of lifestyle modifications.  AGREE: Multiple dietary modification options and treatment options were discussed and  Alyza agreed to the above obesity treatment plan.  I, Trixie Dredge, am acting as transcriptionist for Ilene Qua, MD  I have reviewed the above documentation for accuracy and completeness, and I agree with the above. - Ilene Qua, MD

## 2017-08-07 ENCOUNTER — Ambulatory Visit (INDEPENDENT_AMBULATORY_CARE_PROVIDER_SITE_OTHER): Payer: Medicare PPO | Admitting: Adult Health

## 2017-08-07 ENCOUNTER — Encounter: Payer: Self-pay | Admitting: Adult Health

## 2017-08-07 DIAGNOSIS — E669 Obesity, unspecified: Secondary | ICD-10-CM | POA: Diagnosis not present

## 2017-08-07 DIAGNOSIS — G4733 Obstructive sleep apnea (adult) (pediatric): Secondary | ICD-10-CM | POA: Diagnosis not present

## 2017-08-07 NOTE — Addendum Note (Signed)
Addended by: Parke Poisson E on: 08/07/2017 05:31 PM   Modules accepted: Orders

## 2017-08-07 NOTE — Assessment & Plan Note (Signed)
Great job with weight loss and exercise

## 2017-08-07 NOTE — Progress Notes (Signed)
$'@Patient'l$  ID: Sierra Ford, female    DOB: [birth year], 77 y.o.   MRN: [id number]  Chief Complaint  Patient presents with  . Follow-up    OSA    Referring provider: Ann Held, *  HPI: 77 year old female followed for moderate sleep apnea on CPAP  Significant tests/ events reviewed  HST 11/08/15 AHI 28/h    08/07/2017 Follow up : OSA  Patient presents for a one-year follow-up for sleep apnea.  Patient says she is doing well on CPAP.  She feels rested with no significant daytime sleepiness.  Download shows excellent compliance with average usage at 6 hours.  Patient is on CPAP 10 cm H2O.  AHI 1.0.  Minimum leaks. Patient says she is very active.  Exercises and goes to water aerobics.  She has lost 22 pounds over the last year.  She has been doing a weight loss program.  And is very excited about this.  She travels quite a bit.     No Known Allergies  Immunization History  Administered Date(s) Administered  . Pneumococcal Conjugate-13 04/24/2013  . Pneumococcal Polysaccharide-23 08/07/2007  . Td 03/03/2003  . Zoster 08/14/2007, 11/13/2007    Past Medical History:  Diagnosis Date  . Anxiety   . Back pain   . Chest pain    CLite with apical ischemia in 2006 - normal coronary arteries by [facility] in 12/2004;  Myoview 11/12:  Low risk stress nuclear study with a small, partially reversible apical defect most likely related to apical thinning; cannot R/O very mild apical ischemia.  EF: 75%   . Decreased hearing   . Depression   . Diabetes ([place])   . DVT (deep venous thrombosis) ([place]) 2006   hx of  . Easy bruising   . Ectopic pregnancy with intrauterine pregnancy   . Hiatal hernia   . Hx of blood clots   . Hyperlipidemia   . Joint pain   . Low back pain   . Muscle pain   . PE (pulmonary embolism) 2006   hx of  . Pre-diabetes   . Right shoulder pain   . Shortness of breath   . Sinus congestion   . Sleep apnea    CPAP   . Sprained ankle   . Swelling of  ankle   . Swelling of extremity   . UTI (lower urinary tract infection)    hx    Tobacco History: Social History   Tobacco Use  Smoking Status Former Smoker  . Packs/day: 1.00  . Years: 50.00  . Pack years: 50.00  . Types: Cigarettes  . Last attempt to quit: 11/21/2009  . Years since quitting: 7.7  Smokeless Tobacco Never Used   Counseling given: Not Answered   Outpatient Encounter Medications as of 08/07/2017  Medication Sig  . aspirin EC 81 MG tablet Take 81 mg by mouth at bedtime.    . blood glucose meter kit and supplies KIT Dispense based on patient and insurance preference. Use up to four times daily as directed. (FOR ICD-9 250.00, 250.01).  . CALCIUM-VITAMIN D PO Take 600 Units by mouth 2 (two) times daily.    . cholecalciferol (VITAMIN D-400) 400 UNITS TABS Take 400 Units by mouth 2 (two) times daily.   . Coenzyme Q10 100 MG TABS Take 100 mg by mouth daily.  Marland Kitchen ezetimibe (ZETIA) 10 MG tablet TAKE 1 TABLET BY MOUTH ONCE DAILY  . Flaxseed, Linseed, (FLAXSEED OIL PO) Take by mouth.  . furosemide (LASIX)  40 MG tablet TAKE ONE TABLET BY MOUTH ONCE DAILY  . metFORMIN (GLUCOPHAGE XR) 500 MG 24 hr tablet Take 1 tablet (500 mg total) by mouth daily with breakfast.  . Multiple Vitamin (MULTIVITAMIN PO) Take 1 tablet by mouth at bedtime.   . Omega-3 Fatty Acids (FISH OIL PO) Take by mouth.  Marland Kitchen POTASSIUM CHLORIDE PO Take 1 tablet by mouth as needed.  . rosuvastatin (CRESTOR) 10 MG tablet Take 10 mg by mouth daily.  . TRUE METRIX BLOOD GLUCOSE TEST test strip TEST  UP  TO FOUR TIMES DAILY AS DIRECTED  . TRUEPLUS LANCETS 33G MISC USE UP TO FOUR TIMES DAILY AS DIRECTED  . [DISCONTINUED] omega-3 acid ethyl esters (LOVAZA) 1 G capsule Take 1 g by mouth 3 (three) times daily.    . [DISCONTINUED] pravastatin (PRAVACHOL) 20 MG tablet Take 20 mg by mouth daily.     Facility-Administered Encounter Medications as of 08/07/2017  Medication  . 0.9 %  sodium chloride infusion     Review of  Systems  Constitutional:   No  weight loss, night sweats,  Fevers, chills, fatigue, or  lassitude.  HEENT:   No headaches,  Difficulty swallowing,  Tooth/dental problems, or  Sore throat,                No sneezing, itching, ear ache, nasal congestion, post nasal drip,   CV:  No chest pain,  Orthopnea, PND, swelling in lower extremities, anasarca, dizziness, palpitations, syncope.   GI  No heartburn, indigestion, abdominal pain, nausea, vomiting, diarrhea, change in bowel habits, loss of appetite, bloody stools.   Resp: No shortness of breath with exertion or at rest.  No excess mucus, no productive cough,  No non-productive cough,  No coughing up of blood.  No change in color of mucus.  No wheezing.  No chest wall deformity  Skin: no rash or lesions.  GU: no dysuria, change in color of urine, no urgency or frequency.  No flank pain, no hematuria   MS:  No joint pain or swelling.  No decreased range of motion.  No back pain.    Physical Exam  BP 118/70 (BP Location: Left Arm, Cuff Size: Normal)   Pulse 94   Ht '5\' 3"'$  (1.6 m)   Wt 176 lb 3.2 oz (79.9 kg)   SpO2 96%   BMI 31.21 kg/m   GEN: A/Ox3; pleasant , NAD, well nourished    HEENT:  [place]/AT,  EACs-clear, TMs-wnl, NOSE-clear, THROAT-clear, no lesions, no postnasal drip or exudate noted. Class 2 MP airway   NECK:  Supple w/ fair ROM; no JVD; normal carotid impulses w/o bruits; no thyromegaly or nodules palpated; no lymphadenopathy.    RESP  Clear  P & A; w/o, wheezes/ rales/ or rhonchi. no accessory muscle use, no dullness to percussion  CARD:  RRR, no m/r/g, no peripheral edema, pulses intact, no cyanosis or clubbing.  GI:   Soft & nt; nml bowel sounds; no organomegaly or masses detected.   Musco: Warm bil, no deformities or joint swelling noted.   Neuro: alert, no focal deficits noted.    Skin: Warm, no lesions or rashes    Lab Results:  CBC  BMET  BNP No results found for: BNP  Imaging: No results  found.   Assessment & Plan:   OSA (obstructive sleep apnea) Well-controlled on CPAP  Plan  Patient Instructions  Continue on CPAP At bedtime   Keep up the good work [employer] job on [employer]  loss Do not drive sleeping Follow-up in 1 year with Dr. Elsworth Soho  And As needed        Obesity (BMI 30-39.9) Great job with weight loss and exercise      [employer], NP 08/07/2017

## 2017-08-07 NOTE — Assessment & Plan Note (Signed)
Well-controlled on CPAP  Plan  Patient Instructions  Continue on CPAP At bedtime   Keep up the good work [place] job on weight loss Do not drive sleeping Follow-up in 1 year with Dr. Elsworth Soho  And As needed

## 2017-08-07 NOTE — Patient Instructions (Signed)
Continue on CPAP At bedtime   Keep up the good work [employer] job on [employer] loss Do not drive sleeping Follow-up in 1 year with Dr. Elsworth Soho  And As needed

## 2017-08-14 ENCOUNTER — Telehealth: Payer: Self-pay | Admitting: *Deleted

## 2017-08-14 NOTE — Telephone Encounter (Signed)
.  Received Medical records from [facility]; forwarded to provider/SLS 06/26

## 2017-08-15 ENCOUNTER — Ambulatory Visit: Payer: Medicare PPO | Admitting: Cardiology

## 2017-08-21 ENCOUNTER — Ambulatory Visit (INDEPENDENT_AMBULATORY_CARE_PROVIDER_SITE_OTHER): Payer: Medicare PPO | Admitting: Family Medicine

## 2017-08-28 DIAGNOSIS — E119 Type 2 diabetes mellitus without complications: Secondary | ICD-10-CM | POA: Diagnosis not present

## 2017-08-28 LAB — HM DIABETES EYE EXAM

## 2017-08-29 ENCOUNTER — Encounter: Payer: Self-pay | Admitting: Family Medicine

## 2017-08-30 ENCOUNTER — Ambulatory Visit (INDEPENDENT_AMBULATORY_CARE_PROVIDER_SITE_OTHER): Payer: Medicare PPO | Admitting: Orthopaedic Surgery

## 2017-09-02 ENCOUNTER — Ambulatory Visit (INDEPENDENT_AMBULATORY_CARE_PROVIDER_SITE_OTHER): Payer: Medicare PPO | Admitting: Physician Assistant

## 2017-09-02 VITALS — BP 107/68 | HR 66 | Temp 98.3°F | Ht 63.0 in | Wt 171.0 lb

## 2017-09-02 DIAGNOSIS — E669 Obesity, unspecified: Secondary | ICD-10-CM | POA: Diagnosis not present

## 2017-09-02 DIAGNOSIS — Z6830 Body mass index (BMI) 30.0-30.9, adult: Secondary | ICD-10-CM | POA: Diagnosis not present

## 2017-09-02 DIAGNOSIS — I5032 Chronic diastolic (congestive) heart failure: Secondary | ICD-10-CM | POA: Diagnosis not present

## 2017-09-02 NOTE — Progress Notes (Signed)
Office: [phone]  /  Fax: [phone]   HPI:   Chief Complaint: OBESITY Sierra Ford is here to discuss her progress with her obesity treatment plan. Sierra Ford is on the Category 2 plan and is following her eating plan approximately 50 % of the time. Sierra Ford states Sierra Ford is swimming for 45 minutes 5-6 times per week and sliver sneakers for 90 minutes 2 days per week. Sierra Ford continues to do well with weight loss. Has had increase in celebration eating and would like celebration eating strategies.  Her weight is 171 lb (77.6 kg) today and has had a weight loss of 1 pound over a period of 4 weeks since her last visit. Sierra Ford has lost 22 lbs since starting treatment with [name].  Chronic Diastolic Heart Failure Sierra Ford has chronic diastolic heart failure. Sierra Ford is on Lasix 40 mg daily. Repeat blood pressure at 107/68. Sierra Ford states Sierra Ford has taken 2 pills of Lasix last night due to swelling of her ankle after recent trip. According to records, Sierra Ford is to take 1 pill daily. Advised her to not increase her Lasix as it can decrease her blood pressure and would put her at risk of hypotension.  ALLERGIES: No Known Allergies  MEDICATIONS: Current Outpatient Medications on File Prior to Visit  Medication Sig Dispense Refill  . aspirin EC 81 MG tablet Take 81 mg by mouth at bedtime.      . blood glucose meter kit and supplies KIT Dispense based on patient and insurance preference. Use up to four times daily as directed. (FOR ICD-9 250.00, 250.01). 1 each 0  . CALCIUM-VITAMIN D PO Take 600 Units by mouth 2 (two) times daily.      . cholecalciferol (VITAMIN D-400) 400 UNITS TABS Take 400 Units by mouth 2 (two) times daily.     . Coenzyme Q10 100 MG TABS Take 100 mg by mouth daily.    Marland Kitchen ezetimibe (ZETIA) 10 MG tablet TAKE 1 TABLET BY MOUTH ONCE DAILY 90 tablet 2  . Flaxseed, Linseed, (FLAXSEED OIL PO) Take by mouth.    . furosemide (LASIX) 40 MG tablet TAKE ONE TABLET BY MOUTH ONCE DAILY 90 tablet 3  . metFORMIN (GLUCOPHAGE XR) 500  MG 24 hr tablet Take 1 tablet (500 mg total) by mouth daily with breakfast. 90 tablet 1  . Multiple Vitamin (MULTIVITAMIN PO) Take 1 tablet by mouth at bedtime.     . Omega-3 Fatty Acids (FISH OIL PO) Take by mouth.    Marland Kitchen POTASSIUM CHLORIDE PO Take 1 tablet by mouth as needed.    . rosuvastatin (CRESTOR) 10 MG tablet Take 10 mg by mouth daily.    . TRUE METRIX BLOOD GLUCOSE TEST test strip TEST  UP  TO FOUR TIMES DAILY AS DIRECTED 100 each 1  . TRUEPLUS LANCETS 33G MISC USE UP TO FOUR TIMES DAILY AS DIRECTED 100 each 1  . [DISCONTINUED] omega-3 acid ethyl esters (LOVAZA) 1 G capsule Take 1 g by mouth 3 (three) times daily.      . [DISCONTINUED] pravastatin (PRAVACHOL) 20 MG tablet Take 20 mg by mouth daily.       Current Facility-Administered Medications on File Prior to Visit  Medication Dose Route Frequency Provider Last Rate Last Dose  . 0.9 %  sodium chloride infusion  500 mL Intravenous Once Nandigam, Venia Minks, MD        PAST MEDICAL HISTORY: Past Medical History:  Diagnosis Date  . Anxiety   . Back pain   . Chest pain  CLite with apical ischemia in 2006 - normal coronary arteries by [facility] in 12/2004;  Myoview 11/12:  Low risk stress nuclear study with a small, partially reversible apical defect most likely related to apical thinning; cannot R/O very mild apical ischemia.  EF: 75%   . Decreased hearing   . Depression   . Diabetes ([place])   . DVT (deep venous thrombosis) ([place]) 2006   hx of  . Easy bruising   . Ectopic pregnancy with intrauterine pregnancy   . Hiatal hernia   . Hx of blood clots   . Hyperlipidemia   . Joint pain   . Low back pain   . Muscle pain   . PE (pulmonary embolism) 2006   hx of  . Pre-diabetes   . Right shoulder pain   . Shortness of breath   . Sinus congestion   . Sleep apnea    CPAP   . Sprained ankle   . Swelling of ankle   . Swelling of extremity   . UTI (lower urinary tract infection)    hx    PAST SURGICAL HISTORY: Past Surgical  History:  Procedure Laterality Date  . ABDOMINAL EXPLORATION SURGERY    . BUNIONECTOMY     right  . COLONOSCOPY  2009  . ECTOPIC PREGNANCY SURGERY    . FOOT SURGERY    . TUBAL LIGATION      SOCIAL HISTORY: Social History   Tobacco Use  . Smoking status: Former Smoker    Packs/day: 1.00    Years: 50.00    Pack years: 50.00    Types: Cigarettes    Last attempt to quit: 11/21/2009    Years since quitting: 7.7  . Smokeless tobacco: Never Used  Substance Use Topics  . Alcohol use: Not Currently    Comment: rare 3 times a year per pt  . Drug use: No    FAMILY HISTORY: Family History  Problem Relation Age of Onset  . Heart failure Mother        died from  . Colon polyps Mother   . Hypertension Mother   . Sudden death Mother   . Obesity Mother   . Alcoholism Father   . Diabetes Maternal Museum/gallery exhibitions officer  . Diabetes Maternal Aunt   . Colon cancer Neg Hx   . Esophageal cancer Neg Hx   . Stomach cancer Neg Hx     ROS: Review of Systems  Constitutional: Positive for weight loss.    PHYSICAL EXAM: Blood pressure 107/68, pulse 66, temperature 98.3 F (36.8 C), temperature source Oral, height '5\' 3"'  (1.6 m), weight 171 lb (77.6 kg), SpO2 98 %. Body mass index is 30.29 kg/m. Physical Exam  Constitutional: Sierra Ford is oriented to person, place, and time. Sierra Ford appears well-developed and well-nourished.  Cardiovascular: Normal rate.  Pulmonary/Chest: Effort normal.  Musculoskeletal: Normal range of motion. Sierra Ford exhibits no edema.  Neurological: Sierra Ford is oriented to person, place, and time.  Skin: Skin is warm and dry.  Psychiatric: Sierra Ford has a normal mood and affect. Her behavior is normal.  Vitals reviewed.   RECENT LABS AND TESTS: BMET    Component Value Date/Time   NA 140 07/11/2017 0908   NA 142 12/28/2016   K 3.8 07/11/2017 0908   CL 101 07/11/2017 0908   CO2 30 07/11/2017 0908   GLUCOSE 107 (H) 07/11/2017 0908   BUN 27 (H) 07/11/2017 0908   BUN 23 (A)  12/28/2016  CREATININE 0.92 07/11/2017 0908   CREATININE 0.82 10/04/2015 0845   CALCIUM 9.9 07/11/2017 0908   GFRNONAA 71 10/04/2015 0845   GFRAA 82 10/04/2015 0845   Lab Results  Component Value Date   HGBA1C 6.8 (H) 07/11/2017   HGBA1C 6.8 (H) 04/08/2017   HGBA1C 6.7 12/28/2016   HGBA1C 6.7 (H) 09/27/2016   HGBA1C 6.8 (H) 06/28/2016   Lab Results  Component Value Date   INSULIN 13.7 05/27/2017   CBC    Component Value Date/Time   WBC 4.7 05/27/2017 1224   WBC 6.4 10/04/2015 0845   RBC 4.70 05/27/2017 1224   RBC 4.61 10/04/2015 0845   HGB 14.4 05/27/2017 1224   HCT 42.2 05/27/2017 1224   PLT 221 05/27/2017 1224   MCV 90 05/27/2017 1224   MCH 30.6 05/27/2017 1224   MCH 31.5 01/01/2011 0744   MCHC 34.1 05/27/2017 1224   MCHC 34.4 10/04/2015 0845   RDW 13.7 05/27/2017 1224   LYMPHSABS 2.1 05/27/2017 1224   MONOABS 0.5 10/04/2015 0845   EOSABS 0.2 05/27/2017 1224   BASOSABS 0.0 05/27/2017 1224   Iron/TIBC/Ferritin/ %Sat No results found for: IRON, TIBC, FERRITIN, IRONPCTSAT Lipid Panel     Component Value Date/Time   CHOL 193 07/11/2017 0908   CHOL 195 05/14/2017 1007   TRIG 116.0 07/11/2017 0908   HDL 79.40 07/11/2017 0908   HDL 95 05/14/2017 1007   CHOLHDL 2 07/11/2017 0908   VLDL 23.2 07/11/2017 0908   LDLCALC 90 07/11/2017 0908   LDLCALC 88 05/14/2017 1007   LDLDIRECT 95.9 10/06/2012 1248   Hepatic Function Panel     Component Value Date/Time   PROT 7.0 07/11/2017 0908   PROT 6.6 05/14/2017 1007   ALBUMIN 4.3 07/11/2017 0908   ALBUMIN 4.3 05/14/2017 1007   AST 20 07/11/2017 0908   ALT 21 07/11/2017 0908   ALKPHOS 70 07/11/2017 0908   BILITOT 1.2 07/11/2017 0908   BILITOT 1.0 05/14/2017 1007   BILIDIR 0.25 05/14/2017 1007   IBILI 0.9 11/15/2015 0831      Component Value Date/Time   TSH 1.250 05/27/2017 1224   TSH 1.27 12/28/2016   TSH 1.00 01/03/2015   TSH 1.61 08/07/2007 0938   TSH 1.08 01/10/2007 0000    ASSESSMENT AND PLAN: Chronic  diastolic heart failure (HCC)  Class 1 obesity with serious comorbidity and body mass index (BMI) of 30.0 to 30.9 in adult, unspecified obesity type  PLAN:  Chronic Diastolic Heart Failure We discussed sodium restriction, working on healthy weight loss, and a regular exercise program as the means to achieve improved blood pressure control. Sierra Ford agreed with this plan and agreed to follow up as directed. We will continue to monitor her blood pressure as well as her progress with the above lifestyle modifications. Sierra Ford agrees to continue Lasix 40 mg daily and Sierra Ford will watch for signs of hypotension as Sierra Ford continues her lifestyle modifications. Sierra Ford agrees to follow up with our clinic in 2 to 3 weeks.  We spent > than 50% of the 15 minute visit on the counseling as documented in the note.  Obesity Sierra Ford is currently in the action stage of change. As such, her goal is to continue with weight loss efforts Sierra Ford has agreed to follow the Category 2 plan Sierra Ford has been instructed to work up to a goal of 150 minutes of combined cardio and strengthening exercise per week for weight loss and overall health benefits. We discussed the following Behavioral Modification Strategies today: increasing lean  protein intake and work on meal planning and easy Sierra Ford has agreed to follow up with our clinic in 2 to 3 weeks. Sierra Ford was informed of the importance of frequent follow up visits to maximize her success with intensive lifestyle modifications for her multiple health conditions.   OBESITY BEHAVIORAL INTERVENTION VISIT  Today's visit was # 6 out of 22.  Starting weight: 193 lbs Starting date: 05/27/17 Today's weight : 171 lbs  Today's date: 09/02/2017 Total lbs lost to date: 85 (Patients must lose 7 lbs in the first 6 months to continue with counseling)   ASK: We discussed the diagnosis of obesity with Sierra Ford today and Sierra Ford agreed to give [name] permission to discuss obesity  behavioral modification therapy today.  ASSESS: Anuhea has the diagnosis of obesity and her BMI today is 30.3 Sierra Ford is in the action stage of change   ADVISE: Sierra Ford was educated on the multiple health risks of obesity as well as the benefit of weight loss to improve her health. Sierra Ford was advised of the need for long term treatment and the importance of lifestyle modifications.  AGREE: Multiple dietary modification options and treatment options were discussed and  Sierra Ford agreed to the above obesity treatment plan.   Sierra Ford, am acting as transcriptionist for Lacy Duverney, PA-C I, Lacy Duverney [facility], have reviewed this note and agree with its content

## 2017-09-03 DIAGNOSIS — G4733 Obstructive sleep apnea (adult) (pediatric): Secondary | ICD-10-CM | POA: Diagnosis not present

## 2017-09-16 ENCOUNTER — Ambulatory Visit (INDEPENDENT_AMBULATORY_CARE_PROVIDER_SITE_OTHER): Payer: Medicare PPO | Admitting: Family Medicine

## 2017-09-16 VITALS — BP 91/60 | HR 53 | Temp 98.4°F | Ht 63.0 in | Wt 173.0 lb

## 2017-09-16 DIAGNOSIS — Z6830 Body mass index (BMI) 30.0-30.9, adult: Secondary | ICD-10-CM

## 2017-09-16 DIAGNOSIS — E669 Obesity, unspecified: Secondary | ICD-10-CM

## 2017-09-16 DIAGNOSIS — E559 Vitamin D deficiency, unspecified: Secondary | ICD-10-CM | POA: Diagnosis not present

## 2017-09-16 DIAGNOSIS — E119 Type 2 diabetes mellitus without complications: Secondary | ICD-10-CM | POA: Diagnosis not present

## 2017-09-17 NOTE — Progress Notes (Signed)
Office: [phone]  /  Fax: [phone]   HPI:   Chief Complaint: OBESITY Sierra Ford is here to discuss her progress with her obesity treatment plan. She is on the Category 2 plan and is following her eating plan approximately 60 % of the time. She states she is swimming for 60 minutes 3 times per week. Sierra Ford had a 3 day class reunion that was heavy on indulgent foods and celebrated her birthday the past weekend. Has a family reunion for 3 days this weekend.  Her weight is 173 lb (78.5 kg) today and has gained 2 pounds since her last visit. She has lost 20 lbs since starting treatment with Sierra Ford.  Diabetes II Sierra Ford has a diagnosis of diabetes type II. Sierra Ford notes carbohydrate cravings when celebrating. She denies GI side effects of metformin and she denies any hypoglycemic episodes. Last A1c was 6.8 on 07/11/17. She has been working on intensive lifestyle modifications including diet, exercise, and weight loss to help control her blood glucose levels.  Vitamin D Deficiency Sierra Ford has a diagnosis of vitamin D deficiency. She is on OTC Vit D replacement and denies nausea, vomiting or muscle weakness.  ALLERGIES: No Known Allergies  MEDICATIONS: Current Outpatient Medications on File Prior to Visit  Medication Sig Dispense Refill  . aspirin EC 81 MG tablet Take 81 mg by mouth at bedtime.      . blood glucose meter kit and supplies KIT Dispense based on patient and insurance preference. Use up to four times daily as directed. (FOR ICD-9 250.00, 250.01). 1 each 0  . CALCIUM-VITAMIN D PO Take 600 Units by mouth 2 (two) times daily.      . cholecalciferol (VITAMIN D-400) 400 UNITS TABS Take 400 Units by mouth 2 (two) times daily.     . Coenzyme Q10 100 MG TABS Take 100 mg by mouth daily.    Marland Kitchen ezetimibe (ZETIA) 10 MG tablet TAKE 1 TABLET BY MOUTH ONCE DAILY 90 tablet 2  . Flaxseed, Linseed, (FLAXSEED OIL PO) Take by mouth.    . furosemide (LASIX) 40 MG tablet TAKE ONE TABLET BY MOUTH ONCE DAILY 90  tablet 3  . metFORMIN (GLUCOPHAGE XR) 500 MG 24 hr tablet Take 1 tablet (500 mg total) by mouth daily with breakfast. 90 tablet 1  . Multiple Vitamin (MULTIVITAMIN PO) Take 1 tablet by mouth at bedtime.     . Omega-3 Fatty Acids (FISH OIL PO) Take by mouth.    Marland Kitchen POTASSIUM CHLORIDE PO Take 1 tablet by mouth as needed.    . rosuvastatin (CRESTOR) 10 MG tablet Take 10 mg by mouth daily.    . TRUE METRIX BLOOD GLUCOSE TEST test strip TEST  UP  TO FOUR TIMES DAILY AS DIRECTED 100 each 1  . TRUEPLUS LANCETS 33G MISC USE UP TO FOUR TIMES DAILY AS DIRECTED 100 each 1  . [DISCONTINUED] omega-3 acid ethyl esters (LOVAZA) 1 G capsule Take 1 g by mouth 3 (three) times daily.      . [DISCONTINUED] pravastatin (PRAVACHOL) 20 MG tablet Take 20 mg by mouth daily.       Current Facility-Administered Medications on File Prior to Visit  Medication Dose Route Frequency Provider Last Rate Last Dose  . 0.9 %  sodium chloride infusion  500 mL Intravenous Once Nandigam, Venia Minks, MD        PAST MEDICAL HISTORY: Past Medical History:  Diagnosis Date  . Anxiety   . Back pain   . Chest pain  CLite with apical ischemia in 2006 - normal coronary arteries by [facility] in 12/2004;  Myoview 11/12:  Low risk stress nuclear study with a small, partially reversible apical defect most likely related to apical thinning; cannot R/O very mild apical ischemia.  EF: 75%   . Decreased hearing   . Depression   . Diabetes ([place])   . DVT (deep venous thrombosis) ([place]) 2006   hx of  . Easy bruising   . Ectopic pregnancy with intrauterine pregnancy   . Hiatal hernia   . Hx of blood clots   . Hyperlipidemia   . Joint pain   . Low back pain   . Muscle pain   . PE (pulmonary embolism) 2006   hx of  . Pre-diabetes   . Right shoulder pain   . Shortness of breath   . Sinus congestion   . Sleep apnea    CPAP   . Sprained ankle   . Swelling of ankle   . Swelling of extremity   . UTI (lower urinary tract infection)    hx     PAST SURGICAL HISTORY: Past Surgical History:  Procedure Laterality Date  . ABDOMINAL EXPLORATION SURGERY    . BUNIONECTOMY     right  . COLONOSCOPY  2009  . ECTOPIC PREGNANCY SURGERY    . FOOT SURGERY    . TUBAL LIGATION      SOCIAL HISTORY: Social History   Tobacco Use  . Smoking status: Former Smoker    Packs/day: 1.00    Years: 50.00    Pack years: 50.00    Types: Cigarettes    Last attempt to quit: 11/21/2009    Years since quitting: 7.8  . Smokeless tobacco: Never Used  Substance Use Topics  . Alcohol use: Not Currently    Comment: rare 3 times a year per pt  . Drug use: No    FAMILY HISTORY: Family History  Problem Relation Age of Onset  . Heart failure Mother        died from  . Colon polyps Mother   . Hypertension Mother   . Sudden death Mother   . Obesity Mother   . Alcoholism Father   . Diabetes Maternal Museum/gallery exhibitions officer  . Diabetes Maternal Aunt   . Colon cancer Neg Hx   . Esophageal cancer Neg Hx   . Stomach cancer Neg Hx     ROS: Review of Systems  Constitutional: Negative for weight loss.  Gastrointestinal: Negative for nausea and vomiting.  Musculoskeletal:       Negative muscle weakness  Endo/Heme/Allergies:       Negative hypoglycemia    PHYSICAL EXAM: Blood pressure 91/60, pulse (!) 53, temperature 98.4 F (36.9 C), temperature source Oral, height '5\' 3"'  (1.6 m), weight 173 lb (78.5 kg), SpO2 98 %. Body mass index is 30.65 kg/m. Physical Exam  Constitutional: She is oriented to person, place, and time. She appears well-developed and well-nourished.  Cardiovascular: Normal rate.  Pulmonary/Chest: Effort normal.  Musculoskeletal: Normal range of motion.  Neurological: She is oriented to person, place, and time.  Skin: Skin is warm and dry.  Psychiatric: She has a normal mood and affect. Her behavior is normal.  Vitals reviewed.   RECENT LABS AND TESTS: BMET    Component Value Date/Time   NA 140 07/11/2017 0908    NA 142 12/28/2016   K 3.8 07/11/2017 0908   CL 101 07/11/2017 0908   CO2  30 07/11/2017 0908   GLUCOSE 107 (H) 07/11/2017 0908   BUN 27 (H) 07/11/2017 0908   BUN 23 (A) 12/28/2016   CREATININE 0.92 07/11/2017 0908   CREATININE 0.82 10/04/2015 0845   CALCIUM 9.9 07/11/2017 0908   GFRNONAA 71 10/04/2015 0845   GFRAA 82 10/04/2015 0845   Lab Results  Component Value Date   HGBA1C 6.8 (H) 07/11/2017   HGBA1C 6.8 (H) 04/08/2017   HGBA1C 6.7 12/28/2016   HGBA1C 6.7 (H) 09/27/2016   HGBA1C 6.8 (H) 06/28/2016   Lab Results  Component Value Date   INSULIN 13.7 05/27/2017   CBC    Component Value Date/Time   WBC 4.7 05/27/2017 1224   WBC 6.4 10/04/2015 0845   RBC 4.70 05/27/2017 1224   RBC 4.61 10/04/2015 0845   HGB 14.4 05/27/2017 1224   HCT 42.2 05/27/2017 1224   PLT 221 05/27/2017 1224   MCV 90 05/27/2017 1224   MCH 30.6 05/27/2017 1224   MCH 31.5 01/01/2011 0744   MCHC 34.1 05/27/2017 1224   MCHC 34.4 10/04/2015 0845   RDW 13.7 05/27/2017 1224   LYMPHSABS 2.1 05/27/2017 1224   MONOABS 0.5 10/04/2015 0845   EOSABS 0.2 05/27/2017 1224   BASOSABS 0.0 05/27/2017 1224   Iron/TIBC/Ferritin/ %Sat No results found for: IRON, TIBC, FERRITIN, IRONPCTSAT Lipid Panel     Component Value Date/Time   CHOL 193 07/11/2017 0908   CHOL 195 05/14/2017 1007   TRIG 116.0 07/11/2017 0908   HDL 79.40 07/11/2017 0908   HDL 95 05/14/2017 1007   CHOLHDL 2 07/11/2017 0908   VLDL 23.2 07/11/2017 0908   LDLCALC 90 07/11/2017 0908   LDLCALC 88 05/14/2017 1007   LDLDIRECT 95.9 10/06/2012 1248   Hepatic Function Panel     Component Value Date/Time   PROT 7.0 07/11/2017 0908   PROT 6.6 05/14/2017 1007   ALBUMIN 4.3 07/11/2017 0908   ALBUMIN 4.3 05/14/2017 1007   AST 20 07/11/2017 0908   ALT 21 07/11/2017 0908   ALKPHOS 70 07/11/2017 0908   BILITOT 1.2 07/11/2017 0908   BILITOT 1.0 05/14/2017 1007   BILIDIR 0.25 05/14/2017 1007   IBILI 0.9 11/15/2015 0831      Component Value  Date/Time   TSH 1.250 05/27/2017 1224   TSH 1.27 12/28/2016   TSH 1.00 01/03/2015   TSH 1.61 08/07/2007 0938   TSH 1.08 01/10/2007 0000  Results for REGINAE, WOLFREY (MRN [id number]) as of 09/17/2017 09:16  Ref. Range 05/27/2017 12:24  Vitamin D, 25-Hydroxy Latest Ref Range: 30.0 - 100.0 ng/mL 45.9    ASSESSMENT AND PLAN: Type 2 diabetes mellitus without complication, without long-term current use of insulin (HCC)  Vitamin D deficiency  Class 1 obesity with serious comorbidity and body mass index (BMI) of 30.0 to 30.9 in adult, unspecified obesity type  PLAN:  Diabetes II Adyline has been given extensive diabetes education by myself today including ideal fasting and post-prandial blood glucose readings, individual ideal Hgb A1c goals and hypoglycemia prevention. We discussed the importance of good blood sugar control to decrease the likelihood of diabetic complications such as nephropathy, neuropathy, limb loss, blindness, coronary artery disease, and death. We discussed the importance of intensive lifestyle modification including diet, exercise and weight loss as the first line treatment for diabetes. Nadirah agrees to continue taking metformin and she agrees to follow up with our clinic in 2 weeks.  Vitamin D Deficiency Rosalia was informed that low vitamin D levels contributes to fatigue and are associated with  obesity, breast, and colon cancer. Indiah agrees to continue taking OTC Vit D replacement and will follow up for routine testing of vitamin D, at least 2-3 times per year. She was informed of the risk of over-replacement of vitamin D and agrees to not increase her dose unless she discusses this with Sierra Ford first. We will check Vit D level in early September and Kathrynn agrees to follow up with our clinic in 2 weeks.  We spent > than 50% of the 15 minute visit on the counseling as documented in the note.  Obesity Senna is currently in the action stage of change. As such, her goal is to  continue with weight loss efforts She has agreed to follow the Category 2 plan [place] has been instructed to work up to a goal of 150 minutes of combined cardio and strengthening exercise per week for weight loss and overall health benefits. We discussed the following Behavioral Modification Strategies today: increasing lean protein intake, increasing vegetables, work on meal planning and easy cooking plans, dealing with family or coworker sabotage, celebration eating strategies, and planning for success   Kana has agreed to follow up with our clinic in 2 weeks. She was informed of the importance of frequent follow up visits to maximize her success with intensive lifestyle modifications for her multiple health conditions.   OBESITY BEHAVIORAL INTERVENTION VISIT  Today's visit was # 7 out of 22.  Starting weight: 193 lbs Starting date: 05/27/17 Today's weight : 173 lbs  Today's date: 09/16/2017 Total lbs lost to date: 35    ASK: We discussed the diagnosis of obesity with [name] today and Anali agreed to give Sierra Ford permission to discuss obesity behavioral modification therapy today.  ASSESS: Rosemae has the diagnosis of obesity and her BMI today is 30.65 Namita is in the action stage of change   ADVISE: Lynessa was educated on the multiple health risks of obesity as well as the benefit of weight loss to improve her health. She was advised of the need for long term treatment and the importance of lifestyle modifications.  AGREE: Multiple dietary modification options and treatment options were discussed and  Annalena agreed to the above obesity treatment plan.  I, Trixie Dredge, am acting as transcriptionist for Ilene Qua, MD  I have reviewed the above documentation for accuracy and completeness, and I agree with the above. - Ilene Qua, MD

## 2017-09-23 ENCOUNTER — Telehealth: Payer: Self-pay | Admitting: Cardiology

## 2017-09-23 NOTE — Telephone Encounter (Signed)
New message   Pt c/o swelling: STAT is pt has developed SOB within 24 hours  1) How much weight have you gained and in what time span? n/a  2) If swelling, where is the swelling located? Swelling in patients ankles  3) Are you currently taking a fluid pill? furosemide (LASIX) 40 MG tabletfurosemide (LASIX) 40 MG tablet  4) Are you currently SOB? Patient has sob when she exerts herself.  5) Do you have a log of your daily weights (if so, list)? no  6) Have you gained 3 pounds in a day or 5 pounds in a week?no  7) Have you traveled recently? Patient has traveled to [place] within the last week.   Patient states that she is having excessive swelling in her ankles the patients wants to know if can take 2 tablets of furosemide (LASIX) 40 MG tablet daily instead of 1.

## 2017-09-23 NOTE — Telephone Encounter (Signed)
Spoke to patient.  She states she had taking  40 mg x2 daily for 2 3-4 weeks prior to  09/02/17 appointment with  [facility]. She states blood pressure was low but she does not remember the actually number . Patient states the office had her to wait a little an recheck blood pressure and it came up. The center informed patient to contact Dr Percival Spanish , but went out of town last week . And  Went back to taking only 40 mg lasix daily , she wanted to know if she can take 80 mg again (40mg  x 2) if feet are swelling.  RN ASKED PATIENT IF SHE HAS CHECKED BLOOD PRESSURE  SINCE LAST OFFICE VISIT ON 09/16/17 WITH WEIGHT management. Patient stated no , she did not have a blood pressure cuff. She stated she will go to a drugstore and use their machine. RN informed patient to go to her local fire deppt station for a more accurate reading. Patient states she would and call back with reading and the will be able to ask Dr Michelle Piper?

## 2017-09-24 NOTE — Telephone Encounter (Signed)
Pt calling to give her BP reading   120/80. Please call pt is needed.

## 2017-09-24 NOTE — Telephone Encounter (Signed)
Looks great.  No change.

## 2017-10-01 NOTE — Telephone Encounter (Signed)
Leave detailed message on pt voicemail(DPR) with Dr Percival Spanish response.

## 2017-10-09 ENCOUNTER — Ambulatory Visit (INDEPENDENT_AMBULATORY_CARE_PROVIDER_SITE_OTHER): Payer: Self-pay | Admitting: Physician Assistant

## 2017-10-09 ENCOUNTER — Encounter (INDEPENDENT_AMBULATORY_CARE_PROVIDER_SITE_OTHER): Payer: Self-pay

## 2017-10-24 ENCOUNTER — Encounter: Payer: Self-pay | Admitting: Family Medicine

## 2017-10-24 ENCOUNTER — Ambulatory Visit (INDEPENDENT_AMBULATORY_CARE_PROVIDER_SITE_OTHER): Payer: Medicare PPO | Admitting: Family Medicine

## 2017-10-24 VITALS — BP 116/55 | HR 63 | Temp 98.4°F | Resp 16 | Ht 63.0 in | Wt 178.8 lb

## 2017-10-24 DIAGNOSIS — R609 Edema, unspecified: Secondary | ICD-10-CM | POA: Diagnosis not present

## 2017-10-24 DIAGNOSIS — Z0000 Encounter for general adult medical examination without abnormal findings: Secondary | ICD-10-CM

## 2017-10-24 DIAGNOSIS — E1165 Type 2 diabetes mellitus with hyperglycemia: Secondary | ICD-10-CM | POA: Diagnosis not present

## 2017-10-24 DIAGNOSIS — I5032 Chronic diastolic (congestive) heart failure: Secondary | ICD-10-CM

## 2017-10-24 DIAGNOSIS — IMO0002 Reserved for concepts with insufficient information to code with codable children: Secondary | ICD-10-CM

## 2017-10-24 DIAGNOSIS — E1151 Type 2 diabetes mellitus with diabetic peripheral angiopathy without gangrene: Secondary | ICD-10-CM

## 2017-10-24 DIAGNOSIS — I10 Essential (primary) hypertension: Secondary | ICD-10-CM

## 2017-10-24 DIAGNOSIS — E785 Hyperlipidemia, unspecified: Secondary | ICD-10-CM | POA: Diagnosis not present

## 2017-10-24 LAB — COMPREHENSIVE METABOLIC PANEL
ALT: 19 U/L (ref 0–35)
AST: 18 U/L (ref 0–37)
Albumin: 4.6 g/dL (ref 3.5–5.2)
Alkaline Phosphatase: 85 U/L (ref 39–117)
BILIRUBIN TOTAL: 1.1 mg/dL (ref 0.2–1.2)
BUN: 23 mg/dL (ref 6–23)
CALCIUM: 10.1 mg/dL (ref 8.4–10.5)
CHLORIDE: 99 meq/L (ref 96–112)
CO2: 32 meq/L (ref 19–32)
CREATININE: 0.91 mg/dL (ref 0.40–1.20)
GFR: 77.28 mL/min (ref >60.00)
Glucose, Bld: 98 mg/dL (ref 70–99)
Potassium: 4.2 mEq/L (ref 3.5–5.1)
Sodium: 139 mEq/L (ref 135–145)
Total Protein: 7.2 g/dL (ref 6.0–8.3)

## 2017-10-24 LAB — CBC WITH DIFFERENTIAL/PLATELET
BASOS PCT: 0.5 % (ref 0.0–3.0)
Basophils Absolute: 0 10*3/uL (ref 0.0–0.1)
EOS PCT: 4.2 % (ref 0.0–5.0)
Eosinophils Absolute: 0.2 10*3/uL (ref 0.0–0.7)
HEMATOCRIT: 44.2 % (ref 36.0–46.0)
HEMOGLOBIN: 15 g/dL (ref 12.0–15.0)
LYMPHS PCT: 48.9 % — AB (ref 12.0–46.0)
Lymphs Abs: 2.4 10*3/uL (ref 0.7–4.0)
MCHC: 33.9 g/dL (ref 30.0–36.0)
MCV: 92.7 fl (ref 78.0–100.0)
Monocytes Absolute: 0.5 10*3/uL (ref 0.1–1.0)
Monocytes Relative: 9.8 % (ref 3.0–12.0)
Neutro Abs: 1.8 10*3/uL (ref 1.4–7.7)
Neutrophils Relative %: 36.6 % — ABNORMAL LOW (ref 43.0–77.0)
Platelets: 236 10*3/uL (ref 150.0–400.0)
RBC: 4.76 Mil/uL (ref 3.87–5.11)
RDW: 13.4 % (ref 11.5–15.5)
WBC: 4.9 10*3/uL (ref 4.0–10.5)

## 2017-10-24 LAB — HEMOGLOBIN A1C: Hgb A1c MFr Bld: 6.7 % — ABNORMAL HIGH (ref 4.6–6.5)

## 2017-10-24 LAB — LIPID PANEL
CHOL/HDL RATIO: 2
Cholesterol: 222 mg/dL — ABNORMAL HIGH (ref 0–200)
HDL: 108.9 mg/dL (ref >39.00)
LDL CALC: 98 mg/dL (ref 0–99)
NonHDL: 112.7
Triglycerides: 72 mg/dL (ref 0.0–149.0)
VLDL: 14.4 mg/dL (ref 0.0–40.0)

## 2017-10-24 NOTE — Assessment & Plan Note (Signed)
stable °

## 2017-10-24 NOTE — Progress Notes (Signed)
Subjective:     Sierra Ford is a 77 y.o. female and is here for a comprehensive physical exam. The patient reports no problems.  HPI HYPERTENSION   Blood pressure range-not checking   Chest pain- no      Dyspnea- no Lightheadedness- no   Edema- yes but no worse  Other side effects - no   Medication compliance: good  Low salt diet- good    DIABETES    Blood Sugar ranges-up and down per pt  Polyuria- no New Visual problems- no  Hypoglycemic symptoms- no  Other side effects-no Medication compliance - good Last eye exam- done  Foot exam- today   HYPERLIPIDEMIA  Medication compliance- good RUQ pain- no  Muscle aches- no Other side effects-no     Social History   Socioeconomic History  . Marital status: Divorced    Spouse name: Not on file  . Number of children: Not on file  . Years of education: Not on file  . Highest education level: Not on file  Occupational History  . Occupation: Retired Scientist, research (medical)  Social Needs  . Financial resource strain: Not on file  . Food insecurity:    Worry: Not on file    Inability: Not on file  . Transportation needs:    Medical: Not on file    Non-medical: Not on file  Tobacco Use  . Smoking status: Former Smoker    Packs/day: 1.00    Years: 50.00    Pack years: 50.00    Types: Cigarettes    Last attempt to quit: 11/21/2009    Years since quitting: 7.9  . Smokeless tobacco: Never Used  Substance and Sexual Activity  . Alcohol use: Yes    Comment: rare 3 times a year per pt  . Drug use: No  . Sexual activity: Not Currently    Partners: Male  Lifestyle  . Physical activity:    Days per week: Not on file    Minutes per session: Not on file  . Stress: Not on file  Relationships  . Social connections:    Talks on phone: Not on file    Gets together: Not on file    Attends religious service: Not on file    Active member of club or organization: Not on file    Attends meetings of clubs or organizations: Not on file     Relationship status: Not on file  . Intimate partner violence:    Fear of current or ex partner: Not on file    Emotionally abused: Not on file    Physically abused: Not on file    Forced sexual activity: Not on file  Other Topics Concern  . Not on file  Social History Narrative   Regular exercise         Health Maintenance  Topic Date Due  . OPHTHALMOLOGY EXAM  09/18/1950  . TETANUS/TDAP  03/02/2013  . INFLUENZA VACCINE  06/13/2019 (Originally 09/19/2017)  . HEMOGLOBIN A1C  01/11/2018  . URINE MICROALBUMIN  04/08/2018  . MAMMOGRAM  04/11/2018  . FOOT EXAM  10/25/2018  . DEXA SCAN  Completed  . PNA vac Low Risk Adult  Completed    The following portions of the patient's history were reviewed and updated as appropriate:  She  has a past medical history of Anxiety, Back pain, Chest pain, Decreased hearing, Depression, Diabetes ([place]), DVT (deep venous thrombosis) ([place]) (2006), Easy bruising, Ectopic pregnancy with intrauterine pregnancy, Hiatal hernia, blood clots, Hyperlipidemia, Joint  pain, Low back pain, Muscle pain, PE (pulmonary embolism) (2006), Pre-diabetes, Right shoulder pain, Shortness of breath, Sinus congestion, Sleep apnea, Sprained ankle, Swelling of ankle, Swelling of extremity, and UTI (lower urinary tract infection). She does not have any pertinent problems on file. She  has a past surgical history that includes Foot surgery; Abdominal exploration surgery; Ectopic pregnancy surgery; Bunionectomy; Tubal ligation; and Colonoscopy (2009). Her family history includes Alcoholism in her father; Colon polyps in her mother; Diabetes in her maternal aunt and maternal aunt; Heart failure in her mother; Hypertension in her mother; Obesity in her mother; Sudden death in her mother. She  reports that she quit smoking about 7 years ago. Her smoking use included cigarettes. She has a 50.00 pack-year smoking history. She has never used smokeless tobacco. She reports that she drinks  alcohol. She reports that she does not use drugs. She has a current medication list which includes the following prescription(s): aspirin ec, blood glucose meter kit and supplies, calcium-vitamin d, cholecalciferol, coenzyme q10, ezetimibe, flaxseed (linseed), furosemide, metformin, multiple vitamins-minerals, omega-3 fatty acids, potassium chloride, rosuvastatin, true metrix blood glucose test, trueplus lancets 33g, omega-3 acid ethyl esters, and pravastatin, and the following Facility-Administered Medications: sodium chloride. Current Outpatient Medications on File Prior to Visit  Medication Sig Dispense Refill  . aspirin EC 81 MG tablet Take 81 mg by mouth at bedtime.      . blood glucose meter kit and supplies KIT Dispense based on patient and insurance preference. Use up to four times daily as directed. (FOR ICD-9 250.00, 250.01). 1 each 0  . CALCIUM-VITAMIN D PO Take 600 Units by mouth 2 (two) times daily.      . cholecalciferol (VITAMIN D-400) 400 UNITS TABS Take 400 Units by mouth 2 (two) times daily.     . Coenzyme Q10 100 MG TABS Take 100 mg by mouth daily.    Marland Kitchen ezetimibe (ZETIA) 10 MG tablet TAKE 1 TABLET BY MOUTH ONCE DAILY 90 tablet 2  . Flaxseed, Linseed, (FLAXSEED OIL PO) Take by mouth.    . furosemide (LASIX) 40 MG tablet TAKE ONE TABLET BY MOUTH ONCE DAILY 90 tablet 3  . metFORMIN (GLUCOPHAGE XR) 500 MG 24 hr tablet Take 1 tablet (500 mg total) by mouth daily with breakfast. 90 tablet 1  . Multiple Vitamin (MULTIVITAMIN PO) Take 1 tablet by mouth at bedtime.     . Omega-3 Fatty Acids (FISH OIL PO) Take by mouth.    Marland Kitchen POTASSIUM CHLORIDE PO Take 1 tablet by mouth as needed.    . rosuvastatin (CRESTOR) 10 MG tablet Take 10 mg by mouth daily.    . TRUE METRIX BLOOD GLUCOSE TEST test strip TEST  UP  TO FOUR TIMES DAILY AS DIRECTED 100 each 1  . TRUEPLUS LANCETS 33G MISC USE UP TO FOUR TIMES DAILY AS DIRECTED 100 each 1  . [DISCONTINUED] omega-3 acid ethyl esters (LOVAZA) 1 G capsule  Take 1 g by mouth 3 (three) times daily.      . [DISCONTINUED] pravastatin (PRAVACHOL) 20 MG tablet Take 20 mg by mouth daily.       Current Facility-Administered Medications on File Prior to Visit  Medication Dose Route Frequency Provider Last Rate Last Dose  . 0.9 %  sodium chloride infusion  500 mL Intravenous Once Nandigam, Venia Minks, MD       She has No Known Allergies..  Review of Systems Review of Systems  Constitutional: Negative for activity change, appetite change and fatigue.  HENT:  Negative for hearing loss, congestion, tinnitus and ear discharge.  dentist [id number]: Negative for visual disturbance (see optho q1y -- vision corrected to 20/20 with glasses).  Respiratory: Negative for cough, chest tightness and shortness of breath.   Cardiovascular: Negative for chest pain, palpitations and leg swelling.  Gastrointestinal: Negative for abdominal pain, diarrhea, constipation and abdominal distention.  Genitourinary: Negative for urgency, frequency, decreased urine volume and difficulty urinating.  Musculoskeletal: Negative for back pain, arthralgias and gait problem.  Skin: Negative for color change, pallor and rash.  Neurological: Negative for dizziness, light-headedness, numbness and headaches.  Hematological: Negative for adenopathy. Does not bruise/bleed easily.  Psychiatric/Behavioral: Negative for suicidal ideas, confusion, sleep disturbance, self-injury, dysphoric mood, decreased concentration and agitation.  Diabetic Foot Exam - Simple   Simple Foot Form Diabetic Foot exam was performed with the following findings:  Yes 10/24/2017  1:07 PM  Visual Inspection No deformities, no ulcerations, no other skin breakdown bilaterally:  Yes Sensation Testing Intact to touch and monofilament testing bilaterally:  Yes Pulse Check Posterior Tibialis and Dorsalis pulse intact bilaterally:  Yes Comments        Objective:    BP (!) 116/55 (BP Location: Right Arm, Cuff Size:  Normal)   Pulse 63   Temp 98.4 F (36.9 C) (Oral)   Resp 16   Ht _0  (1.6 m)   Wt 178 lb 12.8 oz (81.1 kg)   SpO2 97%   BMI 31.67 kg/m  General appearance: alert, cooperative, appears stated age and no distress Head: Normocephalic, without obvious abnormality, atraumatic Eyes: negative findings: lids and lashes normal, conjunctivae and sclerae normal and pupils equal, round, reactive to light and accomodation Ears: normal TM's and external ear canals both ears Nose: Nares normal. Septum midline. Mucosa normal. No drainage or sinus tenderness. Throat: lips, mucosa, and tongue normal; teeth and gums normal Neck: no adenopathy, no carotid bruit, no JVD, supple, symmetrical, trachea midline and thyroid not enlarged, symmetric, no tenderness/mass/nodules Back: symmetric, no curvature. ROM normal. No CVA tenderness. Lungs: clear to auscultation bilaterally Breasts: gyn Heart: regular rate and rhythm, S1, S2 normal, no murmur, click, rub or gallop Abdomen: soft, non-tender; bowel sounds normal; no masses,  no organomegaly Pelvic: deferred--gyn Extremities: extremities normal, atraumatic, no cyanosis or edema Pulses: 2+ and symmetric Skin: Skin color, texture, turgor normal. No rashes or lesions Lymph nodes: Cervical, supraclavicular, and axillary nodes normal. Neurologic: Alert and oriented X 3, normal strength and tone. Normal symmetric reflexes. Normal coordination and gait    Assessment:    Healthy female exam.      Plan:    ghm utd Check labs  See After Visit Summary for Counseling Recommendations    1. Essential hypertension Well controlled, no changes to meds. Encouraged heart healthy diet such as the DASH diet and exercise as tolerated.  - Hemoglobin A1c - CBC with Differential/Platelet - Lipid panel - Comprehensive metabolic panel  2. DM (diabetes mellitus) type II uncontrolled, periph vascular disorder ([place] hg[place] to be done, minimize simple carbs. Increase  exercise as tolerated. Continue current meds - Hemoglobin A1c - CBC with Differential/Platelet - Lipid panel - Comprehensive metabolic panel  3. Hyperlipidemia LDL goal <70 Tolerating statin, encouraged heart healthy diet, avoid trans fats, minimize simple carbs and saturated fats. Increase exercise as tolerated - Hemoglobin A1c - CBC with Differential/Platelet - Lipid panel - Comprehensive metabolic panel  4. Preventative health care See above

## 2017-10-24 NOTE — Assessment & Plan Note (Signed)
con't meds Per cardiology 

## 2017-10-24 NOTE — Patient Instructions (Addendum)
Bring copy of living will Get your shingles and TDAP vaccine at your pharmacy.      Preventive Care 15 Years and Older, Female Preventive care refers to lifestyle choices and visits with your health care provider that can promote health and wellness. What does preventive care include?  A yearly physical exam. This is also called an annual well check.  Dental exams once or twice a year.  Routine eye exams. Ask your health care provider how often you should have your eyes checked.  Personal lifestyle choices, including: ? Daily care of your teeth and gums. ? Regular physical activity. ? Eating a healthy diet. ? Avoiding tobacco and drug use. ? Limiting alcohol use. ? Practicing safe sex. ? Taking low-dose aspirin every day. ? Taking vitamin and mineral supplements as recommended by your health care provider. What happens during an annual well check? The services and screenings done by your health care provider during your annual well check will depend on your age, overall health, lifestyle risk factors, and family history of disease. Counseling Your health care provider may ask you questions about your:  Alcohol use.  Tobacco use.  Drug use.  Emotional well-being.  Home and relationship well-being.  Sexual activity.  Eating habits.  History of falls.  Memory and ability to understand (cognition).  Work and work Statistician.  Reproductive health.  Screening You may have the following tests or measurements:  Height, weight, and BMI.  Blood pressure.  Lipid and cholesterol levels. These may be checked every 5 years, or more frequently if you are over 41 years old.  Skin check.  Lung cancer screening. You may have this screening every year starting at age 53 if you have a 30-pack-year history of smoking and currently smoke or have quit within the past 15 years.  Fecal occult blood test (FOBT) of the stool. You may have this test every year starting at age  75.  Flexible sigmoidoscopy or colonoscopy. You may have a sigmoidoscopy every 5 years or a colonoscopy every 10 years starting at age 35.  Hepatitis C blood test.  Hepatitis B blood test.  Sexually transmitted disease (STD) testing.  Diabetes screening. This is done by checking your blood sugar (glucose) after you have not eaten for a while (fasting). You may have this done every 1-3 years.  Bone density scan. This is done to screen for osteoporosis. You may have this done starting at age 84.  Mammogram. This may be done every 1-2 years. Talk to your health care provider about how often you should have regular mammograms.  Talk with your health care provider about your test results, treatment options, and if necessary, the need for more tests. Vaccines Your health care provider may recommend certain vaccines, such as:  Influenza vaccine. This is recommended every year.  Tetanus, diphtheria, and acellular pertussis (Tdap, Td) vaccine. You may need a Td booster every 10 years.  Varicella vaccine. You may need this if you have not been vaccinated.  Zoster vaccine. You may need this after age 76.  Measles, mumps, and rubella (MMR) vaccine. You may need at least one dose of MMR if you were born in 1957 or later. You may also need a second dose.  Pneumococcal 13-valent conjugate (PCV13) vaccine. One dose is recommended after age 3.  Pneumococcal polysaccharide (PPSV23) vaccine. One dose is recommended after age 68.  Meningococcal vaccine. You may need this if you have certain conditions.  Hepatitis A vaccine. You may need this if  you have certain conditions or if you travel or work in places where you may be exposed to hepatitis A.  Hepatitis B vaccine. You may need this if you have certain conditions or if you travel or work in places where you may be exposed to hepatitis B.  Haemophilus influenzae type b (Hib) vaccine. You may need this if you have certain conditions.  Talk to  your health care provider about which screenings and vaccines you need and how often you need them. This information is not intended to replace advice given to you by your health care provider. Make sure you discuss any questions you have with your health care provider. Document Released: 03/04/2015 Document Revised: 10/26/2015 Document Reviewed: 12/07/2014 Elsevier Interactive Patient Education  [employer].

## 2017-10-24 NOTE — Assessment & Plan Note (Signed)
hgba1c to be checked, minimize simple carbs. Increase exercise as tolerated. Continue current meds  

## 2017-10-24 NOTE — Assessment & Plan Note (Signed)
Tolerating statin, encouraged heart healthy diet, avoid trans fats, minimize simple carbs and saturated fats. Increase exercise as tolerated 

## 2017-11-01 ENCOUNTER — Encounter: Payer: Self-pay | Admitting: *Deleted

## 2017-11-06 ENCOUNTER — Ambulatory Visit (INDEPENDENT_AMBULATORY_CARE_PROVIDER_SITE_OTHER): Payer: Medicare PPO | Admitting: Bariatrics

## 2017-11-06 VITALS — BP 124/72 | HR 62 | Temp 97.7°F | Ht 63.0 in | Wt 172.0 lb

## 2017-11-06 DIAGNOSIS — E119 Type 2 diabetes mellitus without complications: Secondary | ICD-10-CM | POA: Diagnosis not present

## 2017-11-06 DIAGNOSIS — E669 Obesity, unspecified: Secondary | ICD-10-CM

## 2017-11-06 DIAGNOSIS — E559 Vitamin D deficiency, unspecified: Secondary | ICD-10-CM

## 2017-11-06 DIAGNOSIS — Z6830 Body mass index (BMI) 30.0-30.9, adult: Secondary | ICD-10-CM

## 2017-11-06 NOTE — Progress Notes (Signed)
Office: [phone]  /  Fax: [phone]   HPI:   Chief Complaint: OBESITY Sierra Ford is here to discuss her progress with her obesity treatment plan. She is on the Category 2 plan and is following her eating plan approximately 50 % of the time. She states she is doing water aerobics 45 minutes 4 times per week. Sierra Ford is still having hunger and getting adequate protein. She is "bored with diet plan". She states " I want to try something different."  Her weight is 172 lb (78 kg) today and has had a weight loss of 1 pound over a period of 7 weeks since her last visit. She has lost 21 lbs since starting treatment with [name].  Diabetes II Sierra Ford has a diagnosis of diabetes type II. Sierra Ford is taking metformin. Her last A1c was 6.7. She denies hypoglycemic episodes, polydipsia, polyphagia, or GI side effects. She has been working on intensive lifestyle modifications including diet, exercise, and weight loss to help control her blood glucose levels.  Vitamin D deficiency Sierra Ford has a diagnosis of vitamin D deficiency. Her last vitamin D level was 45.9 on 05/27/17. She is currently taking OTC vit D and denies nausea, vomiting or muscle weakness.  ALLERGIES: No Known Allergies  MEDICATIONS: Current Outpatient Medications on File Prior to Visit  Medication Sig Dispense Refill  . aspirin EC 81 MG tablet Take 81 mg by mouth at bedtime.      . blood glucose meter kit and supplies KIT Dispense based on patient and insurance preference. Use up to four times daily as directed. (FOR ICD-9 250.00, 250.01). 1 each 0  . CALCIUM-VITAMIN D PO Take 600 Units by mouth 2 (two) times daily.      . cholecalciferol (VITAMIN D-400) 400 UNITS TABS Take 400 Units by mouth 2 (two) times daily.     . Coenzyme Q10 100 MG TABS Take 100 mg by mouth daily.    Marland Kitchen ezetimibe (ZETIA) 10 MG tablet TAKE 1 TABLET BY MOUTH ONCE DAILY 90 tablet 2  . Flaxseed, Linseed, (FLAXSEED OIL PO) Take by mouth.    . furosemide (LASIX) 40 MG tablet  TAKE ONE TABLET BY MOUTH ONCE DAILY 90 tablet 3  . metFORMIN (GLUCOPHAGE XR) 500 MG 24 hr tablet Take 1 tablet (500 mg total) by mouth daily with breakfast. 90 tablet 1  . Multiple Vitamin (MULTIVITAMIN PO) Take 1 tablet by mouth at bedtime.     . Omega-3 Fatty Acids (FISH OIL PO) Take by mouth.    Marland Kitchen POTASSIUM CHLORIDE PO Take 1 tablet by mouth as needed.    . rosuvastatin (CRESTOR) 10 MG tablet Take 10 mg by mouth daily.    . TRUE METRIX BLOOD GLUCOSE TEST test strip TEST  UP  TO FOUR TIMES DAILY AS DIRECTED 100 each 1  . TRUEPLUS LANCETS 33G MISC USE UP TO FOUR TIMES DAILY AS DIRECTED 100 each 1  . [DISCONTINUED] omega-3 acid ethyl esters (LOVAZA) 1 G capsule Take 1 g by mouth 3 (three) times daily.      . [DISCONTINUED] pravastatin (PRAVACHOL) 20 MG tablet Take 20 mg by mouth daily.       Current Facility-Administered Medications on File Prior to Visit  Medication Dose Route Frequency Provider Last Rate Last Dose  . 0.9 %  sodium chloride infusion  500 mL Intravenous Once Sierra Ford, Sierra Minks, MD        PAST MEDICAL HISTORY: Past Medical History:  Diagnosis Date  . Anxiety   . Back  pain   . Chest pain    Sierra Ford with apical ischemia in 2006 - normal coronary arteries by Sierra Ford in 12/2004;  Myoview 11/12:  Low risk stress nuclear study with a small, partially reversible apical defect most likely related to apical thinning; cannot R/O very mild apical ischemia.  EF: 75%   . Decreased hearing   . Depression   . Diabetes ([place])   . DVT (deep venous thrombosis) ([place]) 2006   hx of  . Easy bruising   . Ectopic pregnancy with intrauterine pregnancy   . Hiatal hernia   . Hx of blood clots   . Hyperlipidemia   . Joint pain   . Low back pain   . Muscle pain   . PE (pulmonary embolism) 2006   hx of  . Pre-diabetes   . Right shoulder pain   . Shortness of breath   . Sinus congestion   . Sleep apnea    CPAP   . Sprained ankle   . Swelling of ankle   . Swelling of extremity   . UTI (lower  urinary tract infection)    hx    PAST SURGICAL HISTORY: Past Surgical History:  Procedure Laterality Date  . ABDOMINAL EXPLORATION SURGERY    . BUNIONECTOMY     right  . COLONOSCOPY  2009  . ECTOPIC PREGNANCY SURGERY    . FOOT SURGERY    . TUBAL LIGATION      SOCIAL HISTORY: Social History   Tobacco Use  . Smoking status: Former Smoker    Packs/day: 1.00    Years: 50.00    Pack years: 50.00    Types: Cigarettes    Last attempt to quit: 11/21/2009    Years since quitting: 7.9  . Smokeless tobacco: Never Used  Substance Use Topics  . Alcohol use: Yes    Comment: rare 3 times a year per pt  . Drug use: No    FAMILY HISTORY: Family History  Problem Relation Age of Onset  . Heart failure Mother        died from  . Colon polyps Mother   . Hypertension Mother   . Sudden death Mother   . Obesity Mother   . Alcoholism Father   . Diabetes Maternal Museum/gallery exhibitions officer  . Diabetes Maternal Aunt   . Colon cancer Neg Hx   . Esophageal cancer Neg Hx   . Stomach cancer Neg Hx     ROS: Review of Systems  Constitutional: Negative for weight loss.  Gastrointestinal: Negative for nausea and vomiting.  Musculoskeletal:       Negative for muscle weakness.  Endo/Heme/Allergies: Negative for polydipsia.       Negative for hypoglycemia. Negative for polyphagia.    PHYSICAL EXAM: Blood pressure 124/72, pulse 62, temperature 97.7 F (36.5 C), temperature source Oral, height '5\' 3"'  (1.6 m), weight 172 lb (78 kg), SpO2 95 %. Body mass index is 30.47 kg/m. Physical Exam  Constitutional: She is oriented to person, place, and time. She appears well-developed and well-nourished.  Cardiovascular: Normal rate.  Pulmonary/Chest: Effort normal.  Musculoskeletal: Normal range of motion.  Neurological: She is oriented to person, place, and time.  Skin: Skin is warm and dry.  Psychiatric: She has a normal mood and affect. Her behavior is normal.  Vitals reviewed.   RECENT  LABS AND TESTS: BMET    Component Value Date/Time   NA 139 10/24/2017 1013   NA 142 12/28/2016  K 4.2 10/24/2017 1013   CL 99 10/24/2017 1013   CO2 32 10/24/2017 1013   GLUCOSE 98 10/24/2017 1013   BUN 23 10/24/2017 1013   BUN 23 (A) 12/28/2016   CREATININE 0.91 10/24/2017 1013   CREATININE 0.82 10/04/2015 0845   CALCIUM 10.1 10/24/2017 1013   GFRNONAA 71 10/04/2015 0845   GFRAA 82 10/04/2015 0845   Lab Results  Component Value Date   HGBA1C 6.7 (H) 10/24/2017   HGBA1C 6.8 (H) 07/11/2017   HGBA1C 6.8 (H) 04/08/2017   HGBA1C 6.7 12/28/2016   HGBA1C 6.7 (H) 09/27/2016   Lab Results  Component Value Date   INSULIN 13.7 05/27/2017   CBC    Component Value Date/Time   WBC 4.9 10/24/2017 1013   RBC 4.76 10/24/2017 1013   HGB 15.0 10/24/2017 1013   HGB 14.4 05/27/2017 1224   HCT 44.2 10/24/2017 1013   HCT 42.2 05/27/2017 1224   PLT 236.0 10/24/2017 1013   PLT 221 05/27/2017 1224   MCV 92.7 10/24/2017 1013   MCV 90 05/27/2017 1224   MCH 30.6 05/27/2017 1224   MCH 31.5 01/01/2011 0744   MCHC 33.9 10/24/2017 1013   RDW 13.4 10/24/2017 1013   RDW 13.7 05/27/2017 1224   LYMPHSABS 2.4 10/24/2017 1013   LYMPHSABS 2.1 05/27/2017 1224   MONOABS 0.5 10/24/2017 1013   EOSABS 0.2 10/24/2017 1013   EOSABS 0.2 05/27/2017 1224   BASOSABS 0.0 10/24/2017 1013   BASOSABS 0.0 05/27/2017 1224   Iron/TIBC/Ferritin/ %Sat No results found for: IRON, TIBC, FERRITIN, IRONPCTSAT Lipid Panel     Component Value Date/Time   CHOL 222 (H) 10/24/2017 1013   CHOL 195 05/14/2017 1007   TRIG 72.0 10/24/2017 1013   HDL 108.90 10/24/2017 1013   HDL 95 05/14/2017 1007   CHOLHDL 2 10/24/2017 1013   VLDL 14.4 10/24/2017 1013   LDLCALC 98 10/24/2017 1013   LDLCALC 88 05/14/2017 1007   LDLDIRECT 95.9 10/06/2012 1248   Hepatic Function Panel     Component Value Date/Time   PROT 7.2 10/24/2017 1013   PROT 6.6 05/14/2017 1007   ALBUMIN 4.6 10/24/2017 1013   ALBUMIN 4.3 05/14/2017 1007    AST 18 10/24/2017 1013   ALT 19 10/24/2017 1013   ALKPHOS 85 10/24/2017 1013   BILITOT 1.1 10/24/2017 1013   BILITOT 1.0 05/14/2017 1007   BILIDIR 0.25 05/14/2017 1007   IBILI 0.9 11/15/2015 0831      Component Value Date/Time   TSH 1.250 05/27/2017 1224   TSH 1.27 12/28/2016   TSH 1.00 01/03/2015   TSH 1.61 08/07/2007 0938   TSH 1.08 01/10/2007 0000   Results for BLAKELYN, DINGES (MRN [id number]) as of 11/06/2017 17:32  Ref. Range 05/27/2017 12:24  Vitamin D, 25-Hydroxy Latest Ref Range: 30.0 - 100.0 ng/mL 45.9   ASSESSMENT AND PLAN: Type 2 diabetes mellitus without complication, without long-term current use of insulin (HCC)  Vitamin D deficiency  Class 1 obesity with serious comorbidity and body mass index (BMI) of 30.0 to 30.9 in adult, unspecified obesity type  PLAN:  Diabetes II Sierra Ford has been given extensive diabetes education by myself today including ideal fasting and post-prandial blood glucose readings, individual ideal Hgb A1c goals and hypoglycemia prevention. We discussed the importance of good blood sugar control to decrease the likelihood of diabetic complications such as nephropathy, neuropathy, limb loss, blindness, coronary artery disease, and death. We discussed the importance of intensive lifestyle modification including diet, exercise and weight loss as the  first line treatment for diabetes. Sierra Ford agrees to continue her metformin, decrease carbohydrates, and increase amount of protein. She will follow up at the agreed upon time in 2 weeks.  Vitamin D Deficiency Sierra Ford was informed that low vitamin D levels contributes to fatigue and are associated with obesity, breast, and colon cancer. She agrees to continue to take low dose vitamin D and will follow up for routine testing of vitamin D, at least 2-3 times per year. She was informed of the risk of over-replacement of vitamin D and agrees to not increase her dose unless she discusses this with [name]  first.  Obesity Sierra Ford is currently in the action stage of change. As such, her goal is to continue with weight loss efforts. She has agreed to change to a lower carbohydrate, vegetable and lean protein rich diet plan. She agreed to increase water intake and do more meal planning. Sierra Ford has been instructed to work up to a goal of 150 minutes of combined cardio and strengthening exercise per week for weight loss and overall health benefits. We discussed the following Behavioral Modification Strategies today: increasing lean protein intake, decreasing simple carbohydrates, increasing vegetables, increase H2O intake, and no skipping meals.  Sierra Ford has agreed to follow up with our clinic in 2 weeks. She was informed of the importance of frequent follow up visits to maximize her success with intensive lifestyle modifications for her multiple health conditions.   OBESITY BEHAVIORAL INTERVENTION VISIT  Today's visit was # 8   Starting weight: 193 lbs Starting date: 05/27/17 Today's weight : Weight: 172 lb (78 kg)  Today's date: 11/06/2017 Total lbs lost to date: 21 At least 15 minutes were spent on discussing the following behavioral intervention visit.   ASK: We discussed the diagnosis of obesity with Sierra Ford today and Sierra Ford agreed to give [name] permission to discuss obesity behavioral modification therapy today.  ASSESS: Sierra Ford has the diagnosis of obesity and her BMI today is 30.48. Sierra Ford is in the action stage of change.   ADVISE: Sierra Ford was educated on the multiple health risks of obesity as well as the benefit of weight loss to improve her health. She was advised of the need for long term treatment and the importance of lifestyle modifications to improve her current health and to decrease her risk of future health problems.  AGREE: Multiple dietary modification options and treatment options were discussed and Sierra Ford agreed to follow the recommendations documented in the above  note.  ARRANGE: Sierra Ford was educated on the importance of frequent visits to treat obesity as outlined per CMS and USPSTF guidelines and agreed to schedule her next follow up appointment today.  I, Marcille Blanco, am acting as Location manager for [employer]. Owens Shark, DO  I have reviewed the above documentation for accuracy and completeness, and I agree with the above. -Jearld Lesch, DO

## 2017-11-07 ENCOUNTER — Encounter: Payer: Self-pay | Admitting: Family Medicine

## 2017-11-07 LAB — HM DIABETES EYE EXAM

## 2017-11-11 ENCOUNTER — Other Ambulatory Visit: Payer: Self-pay | Admitting: Family Medicine

## 2017-11-11 DIAGNOSIS — I10 Essential (primary) hypertension: Secondary | ICD-10-CM

## 2017-11-11 DIAGNOSIS — R600 Localized edema: Secondary | ICD-10-CM

## 2017-11-13 ENCOUNTER — Ambulatory Visit: Payer: Medicare PPO | Admitting: Podiatry

## 2017-11-18 ENCOUNTER — Other Ambulatory Visit: Payer: Medicare PPO | Admitting: *Deleted

## 2017-11-18 DIAGNOSIS — E782 Mixed hyperlipidemia: Secondary | ICD-10-CM | POA: Diagnosis not present

## 2017-11-18 LAB — LIPID PANEL
CHOLESTEROL TOTAL: 198 mg/dL (ref 100–199)
Chol/HDL Ratio: 1.8 ratio (ref 0.0–4.4)
HDL: 113 mg/dL (ref >39)
LDL CALC: 74 mg/dL (ref 0–99)
Triglycerides: 57 mg/dL (ref 0–149)
VLDL CHOLESTEROL CAL: 11 mg/dL (ref 5–40)

## 2017-11-19 ENCOUNTER — Ambulatory Visit (INDEPENDENT_AMBULATORY_CARE_PROVIDER_SITE_OTHER): Payer: Medicare PPO | Admitting: Pharmacist

## 2017-11-19 DIAGNOSIS — E785 Hyperlipidemia, unspecified: Secondary | ICD-10-CM

## 2017-11-19 NOTE — Progress Notes (Signed)
Patient ID: Sierra Ford                 DOB: Oct 26, 1940                    MRN: [id number]     HPI: Sierra Ford is a 77 y.o. female patient of Dr. Percival Spanish that presents today for lipid follow up. PMH is significant for DM, mild-moderate carotid plaque on ultrasound, HLD, DVT and PE (2006), and OSA. She is a longstanding patient of ours, with a history of statin intolerance with atorvastatin, pravastatin, rosuvastatin 4m plusLovaza, but did not tolerate these due to myalgias. Currently taking Crestor 132mdaily and Zetia 1067maily and tolerating them well. Pt prefers to follow up every 6 months with lipid panel and office visit; this helps with accountability for lifestyle improvements. Pt has a history of statin intolerance with atorvastatin, pravastatin, rosuvastatin 86m60musLovaza, but did not tolerate these due to myalgias. At last visit 6 months ago, pt was advised to increase Crestor to 86mg59mew days a week as tolerated and continue 10mg 54mother days.  Patient presents today in good spirits. She continues to tolerate her Crestor and Zetia well. Of note, she has continued to take her Crestor 10mg e67m day and did not increase her dose to 86mg a 33mdays a week. She has made huge improvements in lifestyle since her last visit. She has joined a new program and has lost 23 lbs in the past 6 months due to improvements in diet and exercise. She goes to water aerobics every day, Silver Sneakers 3 days a week, and walks on other days. She has been eating high fiber low calorie toast, lean meats, fruits, veggies, and is watching her calorie count.  Current Medications: rosuvastatin 10mg dai79m max tolerated dose, ezetimibe 10mg dail62mtolerances: rosuvastatin 86mg - mus22maches, atorvastatin and pravastatin - muscle aches, Lovaza - muscle aches Risk Factors: CAD - mild-moderate carotid plaque on ultrasound LDL goal: <70mg/dL due54mcarotid plaque  Diet:  Breakfast - egg or yogurt,  low carb toast Lunch - 4 oz lean meat, fruit, some toast Dinner - 8 oz lean meat, 2 cups veggies Snacks - 3 a day under 100 calories - granola bar, pudding, ice cream  Exercise: Pt exercises with Silver Sneakers three times a week, walks other days, and does water aerobics every day. Has lost 23 lbs in the past 6 months.  Family History: Mother died from HF, mother had colon polyps and Alzheimer's disease, aunt and granddaughter has diabetes.  Social History: Former smoker: 1ppd x 50 years. Quit date: 11/21/2009. Minimal alcohol use, no drug use.  Labs: 11/18/17: TC 198, TG 57, HDL 113, LDL 74 (Crestor 10mg daily, 89ma 10mg daily) 356m19: TC 195, TG 58, HDL 95, LDL 88 (Crestor 10mg daily, Ze59m10mg daily) 9/271m: TC 197, TG 55, HDL 112, LDL 74 (Crestor 10mg daily, Zeti17mmg daily) 8/9/178mC 200, TG 62, HDL 101, LDL 87 (Crestor 10mg daily, Zetia 44m daily) 3/27/1825m 217, TG 76, HDL 115, LDL 87 (Crestor 10mg daily, Zetia 1049maily) 10/2015: T59m4, TG 97, HDL 106, LDL 69 (Crestor 10mg daily, Zetia 10mg91mly) 04/2015: TC 15m TG 70, HDL 127, LDL 91, LFTs wnl (Crestor 10mg daily) 10/2014: TC 267mTG 93, HDL 105.4, LDL 138, LFTs wnl (Crestor 10mg daily) - admits to m27mdietary indiscretions this summer 05/2014: TC 184, TG 75, HDL 82.7, LDL 86, LFTs wnl (  Crestor 42m daily)  Past Medical History:  Diagnosis Date  . Anxiety   . Back pain   . Chest pain    [facility] apical ischemia in 2006 [facility]  Myoview 11/12:  Low risk stress nuclear study with a small, partially reversible apical defect most likely related to apical thinning; cannot R/O very mild apical ischemia.  EF: 75%   . Decreased hearing   . Depression   . [place]   . [place]   hx of  . Easy bruising   . Ectopic pregnancy with intrauterine pregnancy   . Hiatal hernia   . Hx of blood clots   . Hyperlipidemia   . Joint pain   . Low back pain     . Muscle pain   . PE (pulmonary embolism) 2006   hx of  . Pre-diabetes   . Right shoulder pain   . Shortness of breath   . Sinus congestion   . Sleep apnea    CPAP   . Sprained ankle   . Swelling of ankle   . Swelling of extremity   . UTI (lower urinary tract infection)    hx    Current Outpatient Medications on File Prior to Visit  Medication Sig Dispense Refill  . aspirin EC 81 MG tablet Take 81 mg by mouth at bedtime.      . blood glucose meter kit and supplies KIT Dispense based on patient and insurance preference. Use up to four times daily as directed. (FOR ICD-9 250.00, 250.01). 1 each 0  . CALCIUM-VITAMIN D PO Take 600 Units by mouth 2 (two) times daily.      . cholecalciferol (VITAMIN D-400) 400 UNITS TABS Take 400 Units by mouth 2 (two) times daily.     . Coenzyme Q10 100 MG TABS Take 100 mg by mouth daily.    .Marland Kitchenezetimibe (ZETIA) 10 MG tablet TAKE 1 TABLET BY MOUTH ONCE DAILY 90 tablet 2  . Flaxseed, Linseed, (FLAXSEED OIL PO) Take by mouth.    . furosemide (LASIX) 40 MG tablet Take 1 tablet (40 mg total) by mouth daily. 90 tablet 1  . metFORMIN (GLUCOPHAGE XR) 500 MG 24 hr tablet Take 1 tablet (500 mg total) by mouth daily with breakfast. 90 tablet 1  . Multiple Vitamin (MULTIVITAMIN PO) Take 1 tablet by mouth at bedtime.     . Omega-3 Fatty Acids (FISH OIL PO) Take by mouth.    .Marland KitchenPOTASSIUM CHLORIDE PO Take 1 tablet by mouth as needed.    . rosuvastatin (CRESTOR) 10 MG tablet Take 10 mg by mouth daily.    . TRUE METRIX BLOOD GLUCOSE TEST test strip TEST  UP  TO FOUR TIMES DAILY AS DIRECTED 100 each 1  . TRUEPLUS LANCETS 33G MISC USE UP TO FOUR TIMES DAILY AS DIRECTED 100 each 1  . [DISCONTINUED] omega-3 acid ethyl esters (LOVAZA) 1 G capsule Take 1 g by mouth 3 (three) times daily.      . [DISCONTINUED] pravastatin (PRAVACHOL) 20 MG tablet Take 20 mg by mouth daily.       Current Facility-Administered Medications on File Prior to Visit  Medication Dose Route  Frequency Provider Last Rate Last Dose  . 0.9 %  sodium chloride infusion  500 mL Intravenous Once Nandigam, KVenia Minks MD        No Known Allergies  Assessment/Plan:  1. Hyperlipidemia: LDL improved from 98 to  74 due to large improvements in lifestyle. She exercises daily and watches her portions and calories and has lost 23 lbs over the past 6 months as a result. Will continue Crestor 76m daily and Zetia 128mdaily. Discussed with pt that she can try increasing Crestor to 2040mWF and continue 81m11ml other days to see if she tolerates slightly higher dose. She continues to take CoQ10 100mg6mly. Recheck lipids in 6 months with pharmacy visit after per patient preference.   Haddie Bruhl E. Allanna Bresee, PharmD, CPP, BCACPShickshinny [id number][id number]: [phone]: [phone]/2019 8:12 AM

## 2017-11-19 NOTE — Patient Instructions (Signed)
It was nice to see you today - your cholesterol looks excellent  Keep up the great work with diet and exercise!  Continue to take Crestor 10mg  daily and Zetia 10mg  daily. If you are able to increase your Crestor to 20mg  a few days a week, this will help to keep your LDL at 70 or less  Follow up for fasting lab work on Monday, April 6th any time after 7:30am  Follow up in clinic for lipid visit on Tuesday, April 7th at 8:30am

## 2017-11-20 ENCOUNTER — Ambulatory Visit (INDEPENDENT_AMBULATORY_CARE_PROVIDER_SITE_OTHER): Payer: Medicare PPO | Admitting: Family Medicine

## 2017-11-20 VITALS — BP 101/66 | HR 65 | Temp 98.6°F | Ht 63.0 in | Wt 173.0 lb

## 2017-11-20 DIAGNOSIS — E559 Vitamin D deficiency, unspecified: Secondary | ICD-10-CM

## 2017-11-20 DIAGNOSIS — E119 Type 2 diabetes mellitus without complications: Secondary | ICD-10-CM

## 2017-11-20 DIAGNOSIS — Z6830 Body mass index (BMI) 30.0-30.9, adult: Secondary | ICD-10-CM | POA: Diagnosis not present

## 2017-11-20 DIAGNOSIS — E669 Obesity, unspecified: Secondary | ICD-10-CM

## 2017-11-20 NOTE — Progress Notes (Signed)
Office: [phone]  /  Fax: [phone]   HPI:   Chief Complaint: OBESITY Sierra Ford is here to discuss her progress with her obesity treatment plan. She is on the lower carbohydrate, vegetable and lean protein rich diet plan and is following her eating plan approximately 75 % of the time. She states she is swimming and going to [employer] for 60 minutes 5 times per week. [name] is planning on going to [place] this weekend for a football game.  Her weight is 173 lb (78.5 kg) today and has not lost weight since her last visit. She has lost 20 lbs since starting treatment with [name].  Diabetes II [name] has a diagnosis of diabetes type II. Her last A1c was 6.7. [name] admits to carb cravings and denies any GI side effects of metformin. She is on a statin. She has been working on intensive lifestyle modifications including diet, exercise, and weight loss to help control her blood glucose levels.  Vitamin D deficiency [name] has a diagnosis of vitamin D deficiency. She is currently taking OTC vit D. She admits fatigue and denies nausea, vomiting or muscle weakness.  ALLERGIES: No Known Allergies  MEDICATIONS: Current Outpatient Medications on File Prior to Visit  Medication Sig Dispense Refill  . aspirin EC 81 MG tablet Take 81 mg by mouth at bedtime.      . blood glucose meter kit and supplies KIT Dispense based on patient and insurance preference. Use up to four times daily as directed. (FOR ICD-9 250.00, 250.01). 1 each 0  . CALCIUM-VITAMIN D PO Take 600 Units by mouth 2 (two) times daily.      . cholecalciferol (VITAMIN D-400) 400 UNITS TABS Take 400 Units by mouth 2 (two) times daily.     . Coenzyme Q10 100 MG TABS Take 100 mg by mouth daily.    Marland Kitchen ezetimibe (ZETIA) 10 MG tablet TAKE 1 TABLET BY MOUTH ONCE DAILY 90 tablet 2  . Flaxseed, Linseed, (FLAXSEED OIL PO) Take by mouth.    . furosemide (LASIX) 40 MG tablet Take 1 tablet (40 mg total) by mouth daily. 90 tablet 1  . metFORMIN  (GLUCOPHAGE XR) 500 MG 24 hr tablet Take 1 tablet (500 mg total) by mouth daily with breakfast. 90 tablet 1  . Multiple Vitamin (MULTIVITAMIN PO) Take 1 tablet by mouth at bedtime.     . Omega-3 Fatty Acids (FISH OIL PO) Take by mouth.    Marland Kitchen POTASSIUM CHLORIDE PO Take 1 tablet by mouth as needed.    . rosuvastatin (CRESTOR) 10 MG tablet Take 10 mg by mouth daily.    . TRUE METRIX BLOOD GLUCOSE TEST test strip TEST  UP  TO FOUR TIMES DAILY AS DIRECTED 100 each 1  . TRUEPLUS LANCETS 33G MISC USE UP TO FOUR TIMES DAILY AS DIRECTED 100 each 1  . [DISCONTINUED] omega-3 acid ethyl esters (LOVAZA) 1 G capsule Take 1 g by mouth 3 (three) times daily.      . [DISCONTINUED] pravastatin (PRAVACHOL) 20 MG tablet Take 20 mg by mouth daily.       Current Facility-Administered Medications on File Prior to Visit  Medication Dose Route Frequency Provider Last Rate Last Dose  . 0.9 %  sodium chloride infusion  500 mL Intravenous Once Nandigam, Venia Minks, MD        PAST MEDICAL HISTORY: Past Medical History:  Diagnosis Date  . Anxiety   . Back pain   . Chest pain    CLite with  apical ischemia in 2006 - normal coronary arteries by [facility] in 12/2004;  Myoview 11/12:  Low risk stress nuclear study with a small, partially reversible apical defect most likely related to apical thinning; cannot R/O very mild apical ischemia.  EF: 75%   . Decreased hearing   . Depression   . Diabetes ([place])   . DVT (deep venous thrombosis) ([place]) 2006   hx of  . Easy bruising   . Ectopic pregnancy with intrauterine pregnancy   . Hiatal hernia   . Hx of blood clots   . Hyperlipidemia   . Joint pain   . Low back pain   . Muscle pain   . PE (pulmonary embolism) 2006   hx of  . Pre-diabetes   . Right shoulder pain   . Shortness of breath   . Sinus congestion   . Sleep apnea    CPAP   . Sprained ankle   . Swelling of ankle   . Swelling of extremity   . UTI (lower urinary tract infection)    hx    PAST SURGICAL  HISTORY: Past Surgical History:  Procedure Laterality Date  . ABDOMINAL EXPLORATION SURGERY    . BUNIONECTOMY     right  . COLONOSCOPY  2009  . ECTOPIC PREGNANCY SURGERY    . FOOT SURGERY    . TUBAL LIGATION      SOCIAL HISTORY: Social History   Tobacco Use  . Smoking status: Former Smoker    Packs/day: 1.00    Years: 50.00    Pack years: 50.00    Types: Cigarettes    Last attempt to quit: 11/21/2009    Years since quitting: 8.0  . Smokeless tobacco: Never Used  Substance Use Topics  . Alcohol use: Yes    Comment: rare 3 times a year per pt  . Drug use: No    FAMILY HISTORY: Family History  Problem Relation Age of Onset  . Heart failure Mother        died from  . Colon polyps Mother   . Hypertension Mother   . Sudden death Mother   . Obesity Mother   . Alcoholism Father   . Diabetes Maternal Museum/gallery exhibitions officer  . Diabetes Maternal Aunt   . Colon cancer Neg Hx   . Esophageal cancer Neg Hx   . Stomach cancer Neg Hx     ROS: Review of Systems  Constitutional: Positive for malaise/fatigue. Negative for weight loss.  Gastrointestinal: Negative for diarrhea, nausea and vomiting.  Musculoskeletal:       Negative for muscle weakness.    PHYSICAL EXAM: Blood pressure 101/66, pulse 65, temperature 98.6 F (37 C), temperature source Oral, height _0  (1.6 m), weight 173 lb (78.5 kg), SpO2 96 %. Body mass index is 30.65 kg/m. Physical Exam  Constitutional: She is oriented to person, place, and time. She appears well-developed and well-nourished.  Cardiovascular: Normal rate.  Pulmonary/Chest: Effort normal.  Musculoskeletal: Normal range of motion.  Neurological: She is oriented to person, place, and time.  Skin: Skin is warm and dry.  Psychiatric: She has a normal mood and affect. Her behavior is normal.  Vitals reviewed.   RECENT LABS AND TESTS: BMET    Component Value Date/Time   NA 139 10/24/2017 1013   NA 142 12/28/2016   K 4.2 10/24/2017  1013   CL 99 10/24/2017 1013   CO2 32 10/24/2017 1013   GLUCOSE 98 10/24/2017 1013  BUN 23 10/24/2017 1013   BUN 23 (A) 12/28/2016   CREATININE 0.91 10/24/2017 1013   CREATININE 0.82 10/04/2015 0845   CALCIUM 10.1 10/24/2017 1013   GFRNONAA 71 10/04/2015 0845   GFRAA 82 10/04/2015 0845   Lab Results  Component Value Date   HGBA1C 6.7 (H) 10/24/2017   HGBA1C 6.8 (H) 07/11/2017   HGBA1C 6.8 (H) 04/08/2017   HGBA1C 6.7 12/28/2016   HGBA1C 6.7 (H) 09/27/2016   Lab Results  Component Value Date   INSULIN 13.7 05/27/2017   CBC    Component Value Date/Time   WBC 4.9 10/24/2017 1013   RBC 4.76 10/24/2017 1013   HGB 15.0 10/24/2017 1013   HGB 14.4 05/27/2017 1224   HCT 44.2 10/24/2017 1013   HCT 42.2 05/27/2017 1224   PLT 236.0 10/24/2017 1013   PLT 221 05/27/2017 1224   MCV 92.7 10/24/2017 1013   MCV 90 05/27/2017 1224   MCH 30.6 05/27/2017 1224   MCH 31.5 01/01/2011 0744   MCHC 33.9 10/24/2017 1013   RDW 13.4 10/24/2017 1013   RDW 13.7 05/27/2017 1224   LYMPHSABS 2.4 10/24/2017 1013   LYMPHSABS 2.1 05/27/2017 1224   MONOABS 0.5 10/24/2017 1013   EOSABS 0.2 10/24/2017 1013   EOSABS 0.2 05/27/2017 1224   BASOSABS 0.0 10/24/2017 1013   BASOSABS 0.0 05/27/2017 1224   Iron/TIBC/Ferritin/ %Sat No results found for: IRON, TIBC, FERRITIN, IRONPCTSAT Lipid Panel     Component Value Date/Time   CHOL 198 11/18/2017 0847   TRIG 57 11/18/2017 0847   HDL 113 11/18/2017 0847   CHOLHDL 1.8 11/18/2017 0847   CHOLHDL 2 10/24/2017 1013   VLDL 14.4 10/24/2017 1013   LDLCALC 74 11/18/2017 0847   LDLDIRECT 95.9 10/06/2012 1248   Hepatic Function Panel     Component Value Date/Time   PROT 7.2 10/24/2017 1013   PROT 6.6 05/14/2017 1007   ALBUMIN 4.6 10/24/2017 1013   ALBUMIN 4.3 05/14/2017 1007   AST 18 10/24/2017 1013   ALT 19 10/24/2017 1013   ALKPHOS 85 10/24/2017 1013   BILITOT 1.1 10/24/2017 1013   BILITOT 1.0 05/14/2017 1007   BILIDIR 0.25 05/14/2017 1007   IBILI  0.9 11/15/2015 0831      Component Value Date/Time   TSH 1.250 05/27/2017 1224   TSH 1.27 12/28/2016   TSH 1.00 01/03/2015   TSH 1.61 08/07/2007 0938   TSH 1.08 01/10/2007 0000   Results for PRINCESS, KARNES (MRN [id number]) as of 11/20/2017 11:50  Ref. Range 05/27/2017 12:24  Vitamin D, 25-Hydroxy Latest Ref Range: 30.0 - 100.0 ng/mL 45.9    ASSESSMENT AND PLAN: Type 2 diabetes mellitus without complication, without long-term current use of insulin (HCC)  Vitamin D deficiency  Class 1 obesity with serious comorbidity and body mass index (BMI) of 30.0 to 30.9 in adult, unspecified obesity type  PLAN:  Diabetes II Octavie has been given extensive diabetes education by myself today including ideal fasting and post-prandial blood glucose readings, individual ideal Hgb A1c goals  and hypoglycemia prevention. We discussed the importance of good blood sugar control to decrease the likelihood of diabetic complications such as nephropathy, neuropathy, limb loss, blindness, coronary artery disease, and death. We discussed the importance of intensive lifestyle modification including diet, exercise and weight loss as the first line treatment for diabetes. Ailyn agrees to continue her metformin and will follow up at the agreed upon time in 2 weeks.  Vitamin D Deficiency Philippa was informed that low vitamin D levels  contributes to fatigue and are associated with obesity, breast, and colon cancer. She agrees to continue to take OTC vitamin D and will follow up for routine testing of vitamin D, at least 2-3 times per year. She was informed of the risk of over-replacement of vitamin D and agrees to not increase her dose unless she discusses this with [name] first. We will see her in 2 weeks.  Obesity Parker is currently in the action stage of change. As such, her goal is to continue with weight loss efforts. She has agreed to [employer] plan + up to 200 calories. Tomia has been instructed to work up to a  goal of 150 minutes of combined cardio and strengthening exercise per week for weight loss and overall health benefits. We discussed the following Behavioral Modification Strategies today: increasing lean protein intake, increasing vegetables, work on meal planning and easy cooking plans, and planning for success.  I spent > than 50% of the 15 minute visit on counseling as documented in the note.  Talula has agreed to follow up with our clinic in 2 weeks. She was informed of the importance of frequent follow up visits to maximize her success with intensive lifestyle modifications for her multiple health conditions.   OBESITY BEHAVIORAL INTERVENTION VISIT  Today's visit was # 9   Starting weight: 193 lbs Starting date: 05/27/17 Today's weight : Weight: 173 lb (78.5 kg)  Today's date: 11/20/2017 Total lbs lost to date: 7  ASK: We discussed the diagnosis of obesity with [name] today and Glender agreed to give [name] permission to discuss obesity behavioral modification therapy today.  ASSESS: Keith has the diagnosis of obesity and her BMI today is 30.65. Dayzha is in the action stage of change.   ADVISE: Malai was educated on the multiple health risks of obesity as well as the benefit of weight loss to improve her health. She was advised of the need for long term treatment and the importance of lifestyle modifications to improve her current health and to decrease her risk of future health problems.  AGREE: Multiple dietary modification options and treatment options were discussed and Winnifred agreed to follow the recommendations documented in the above note.  ARRANGE: Ana was educated on the importance of frequent visits to treat obesity as outlined per CMS and USPSTF guidelines and agreed to schedule her next follow up appointment today.  I, Marcille Blanco, am acting as Location manager for Eber Jones, MD  I have reviewed the above documentation for accuracy and completeness,  and I agree with the above. - Ilene Qua, MD

## 2017-11-25 ENCOUNTER — Telehealth: Payer: Self-pay | Admitting: *Deleted

## 2017-11-25 NOTE — Telephone Encounter (Signed)
Copied from [place] [id number]. Topic: Referral - Request >> Nov 25, 2017 10:35 AM Sheran Luz wrote: Reason for CRM: Pt called to request a referral to a dermatologist for a break out on her legs. Pt states she does not have a preference but would like to get it in as soon as possible. She is requesting a call back to discuss further. Please advise.

## 2017-11-26 NOTE — Telephone Encounter (Signed)
Patient made an appointment to be seen on 11/27/17 with Dr. Nani Ravens

## 2017-11-27 ENCOUNTER — Ambulatory Visit (INDEPENDENT_AMBULATORY_CARE_PROVIDER_SITE_OTHER): Payer: Medicare PPO | Admitting: Family Medicine

## 2017-11-27 ENCOUNTER — Encounter: Payer: Self-pay | Admitting: Family Medicine

## 2017-11-27 VITALS — BP 120/68 | HR 59 | Temp 98.1°F | Ht 62.0 in | Wt 181.4 lb

## 2017-11-27 DIAGNOSIS — L309 Dermatitis, unspecified: Secondary | ICD-10-CM | POA: Diagnosis not present

## 2017-11-27 MED ORDER — TRIAMCINOLONE ACETONIDE 0.1 % EX CREA: 1.0000 "application " | TOPICAL_CREAM | Freq: Two times a day (BID) | CUTANEOUS | 0 refills | Status: DC

## 2017-11-27 NOTE — Progress Notes (Signed)
Chief Complaint  Patient presents with  . Follow-up    spots on legs    Sierra Ford is a 77 y.o. female here for a skin complaint.  Duration: 2 months Location: legs Pruritic? Yes Painful? No Drainage? No New soaps/lotions/topicals/detergents? No Sick contacts? No Other associated symptoms: scaling Therapies tried thus far: vaseline, TAO  ROS:  Const: No fevers Skin: As noted in HPI  Past Medical History:  Diagnosis Date  . Anxiety   . Back pain   . Chest pain    CLite with apical ischemia in 2006 - normal coronary arteries by [facility] in 12/2004;  Myoview 11/12:  Low risk stress nuclear study with a small, partially reversible apical defect most likely related to apical thinning; cannot R/O very mild apical ischemia.  EF: 75%   . Decreased hearing   . Depression   . Diabetes ([place])   . DVT (deep venous thrombosis) ([place]) 2006   hx of  . Easy bruising   . Ectopic pregnancy with intrauterine pregnancy   . Hiatal hernia   . Hx of blood clots   . Hyperlipidemia   . Joint pain   . Low back pain   . Muscle pain   . PE (pulmonary embolism) 2006   hx of  . Pre-diabetes   . Right shoulder pain   . Shortness of breath   . Sinus congestion   . Sleep apnea    CPAP   . Sprained ankle   . Swelling of ankle   . Swelling of extremity   . UTI (lower urinary tract infection)    hx    BP 120/68 (BP Location: Left Arm, Patient Position: Sitting, Cuff Size: Normal)   Pulse (!) 59   Temp 98.1 F (36.7 C) (Oral)   Ht 5\' 2"  (1.575 m)   Wt 181 lb 6 oz (82.3 kg)   SpO2 99%   BMI 33.17 kg/m  Gen: awake, alert, appearing stated age Lungs: No accessory muscle use Skin: +scaly patches on anterior legs. No drainage, erythema, TTP, fluctuance, excoriation Psych: Age appropriate judgment and insight  Dermatitis - Plan: triamcinolone cream (KENALOG) 0.1 %  Orders as above. Cont hydration of skin. F/u prn in 2 weeks. The patient voiced understanding and agreement to the  plan.  [place], DO 11/27/17 9:28 AM

## 2017-11-27 NOTE — Progress Notes (Signed)
Pre visit review using our clinic review tool, if applicable. No additional management support is needed unless otherwise documented below in the visit note. 

## 2017-11-27 NOTE — Patient Instructions (Addendum)
OK to continue Vaseline.  Try not to itch.  I want to see you in 2 weeks if no better.  Let us know if you need anything.

## 2017-11-28 ENCOUNTER — Other Ambulatory Visit: Payer: Self-pay | Admitting: Cardiology

## 2017-12-02 ENCOUNTER — Other Ambulatory Visit: Payer: Self-pay | Admitting: Family Medicine

## 2017-12-03 ENCOUNTER — Ambulatory Visit (INDEPENDENT_AMBULATORY_CARE_PROVIDER_SITE_OTHER): Payer: Medicare PPO | Admitting: Family Medicine

## 2017-12-03 VITALS — BP 101/67 | HR 76 | Temp 97.8°F | Ht 62.0 in | Wt 171.0 lb

## 2017-12-03 DIAGNOSIS — Z6830 Body mass index (BMI) 30.0-30.9, adult: Secondary | ICD-10-CM

## 2017-12-03 DIAGNOSIS — E119 Type 2 diabetes mellitus without complications: Secondary | ICD-10-CM

## 2017-12-03 DIAGNOSIS — E559 Vitamin D deficiency, unspecified: Secondary | ICD-10-CM

## 2017-12-03 DIAGNOSIS — E669 Obesity, unspecified: Secondary | ICD-10-CM | POA: Diagnosis not present

## 2017-12-03 DIAGNOSIS — G4733 Obstructive sleep apnea (adult) (pediatric): Secondary | ICD-10-CM | POA: Diagnosis not present

## 2017-12-05 ENCOUNTER — Ambulatory Visit (INDEPENDENT_AMBULATORY_CARE_PROVIDER_SITE_OTHER): Payer: Medicare PPO | Admitting: Podiatry

## 2017-12-05 ENCOUNTER — Encounter: Payer: Self-pay | Admitting: Podiatry

## 2017-12-05 DIAGNOSIS — M779 Enthesopathy, unspecified: Secondary | ICD-10-CM

## 2017-12-05 DIAGNOSIS — L84 Corns and callosities: Secondary | ICD-10-CM | POA: Diagnosis not present

## 2017-12-05 DIAGNOSIS — M2042 Other hammer toe(s) (acquired), left foot: Secondary | ICD-10-CM

## 2017-12-05 DIAGNOSIS — E119 Type 2 diabetes mellitus without complications: Secondary | ICD-10-CM | POA: Diagnosis not present

## 2017-12-05 MED ORDER — TRIAMCINOLONE ACETONIDE 10 MG/ML IJ SUSP
10.0000 mg | Freq: Once | INTRAMUSCULAR | Status: AC
  Administered 2017-12-05: 10 mg

## 2017-12-05 MED ORDER — LISINOPRIL 2.5 MG PO TABS: 2.5000 mg | ORAL_TABLET | Freq: Every day | ORAL | 0 refills | Status: DC

## 2017-12-05 NOTE — Progress Notes (Signed)
Office: [phone]  /  Fax: [phone]   HPI:   Chief Complaint: OBESITY Sierra Ford is here to discuss her progress with her obesity treatment plan. She is on the Pescatarian eating plan and is following her eating plan approximately 50 % of the time. She states she is exercising 0 minutes 0 times per week. Sierra Ford only followed her low carbohydrate plan half the time, secondary to being on the road. She has plans to go on a [place] cruise in November. Her weight is 171 lb (77.6 kg) today and has had a weight loss of 2 pounds over a period of 2 weeks since her last visit. She has lost 22 lbs since starting treatment with [name].  Diabetes II Sierra Ford has a diagnosis of diabetes type II. Sierra Ford is on Metformin and statin, but she is not on ACE inhibitor. Sierra Ford denies any hypoglycemic episodes. Her last A1c was at 6.7 at the beginning of September 2019. She has been working on intensive lifestyle modifications including diet, exercise, and weight loss to help control her blood glucose levels.  Vitamin D deficiency Sierra Ford has a diagnosis of vitamin D deficiency. She is currently taking OTC vit D 400 IU two times daily and she denies nausea, vomiting or muscle weakness.  ALLERGIES: No Known Allergies  MEDICATIONS: Current Outpatient Medications on File Prior to Visit  Medication Sig Dispense Refill  . aspirin EC 81 MG tablet Take 81 mg by mouth at bedtime.      . blood glucose meter kit and supplies KIT Dispense based on patient and insurance preference. Use up to four times daily as directed. (FOR ICD-9 250.00, 250.01). 1 each 0  . CALCIUM-VITAMIN D PO Take 600 Units by mouth 2 (two) times daily.      . cholecalciferol (VITAMIN D-400) 400 UNITS TABS Take 400 Units by mouth 2 (two) times daily.     . Coenzyme Q10 100 MG TABS Take 100 mg by mouth daily.    Marland Kitchen ezetimibe (ZETIA) 10 MG tablet TAKE 1 TABLET BY MOUTH ONCE DAILY 90 tablet 2  . Flaxseed, Linseed, (FLAXSEED OIL PO) Take by mouth.    .  furosemide (LASIX) 40 MG tablet Take 1 tablet (40 mg total) by mouth daily. 90 tablet 1  . metFORMIN (GLUCOPHAGE XR) 500 MG 24 hr tablet Take 1 tablet (500 mg total) by mouth daily with breakfast. 90 tablet 1  . Multiple Vitamin (MULTIVITAMIN PO) Take 1 tablet by mouth at bedtime.     . Omega-3 Fatty Acids (FISH OIL PO) Take by mouth.    Marland Kitchen POTASSIUM CHLORIDE PO Take 1 tablet by mouth as needed.    . rosuvastatin (CRESTOR) 10 MG tablet Take 10 mg by mouth daily.    Marland Kitchen triamcinolone cream (KENALOG) 0.1 % Apply 1 application topically 2 (two) times daily. 30 g 0  . TRUE METRIX BLOOD GLUCOSE TEST test strip TEST  UP  TO FOUR TIMES DAILY AS DIRECTED 100 each 1  . TRUEPLUS LANCETS 33G MISC USE UP TO FOUR TIMES DAILY AS DIRECTED 100 each 1  . [DISCONTINUED] omega-3 acid ethyl esters (LOVAZA) 1 G capsule Take 1 g by mouth 3 (three) times daily.      . [DISCONTINUED] pravastatin (PRAVACHOL) 20 MG tablet Take 20 mg by mouth daily.       Current Facility-Administered Medications on File Prior to Visit  Medication Dose Route Frequency Provider Last Rate Last Dose  . 0.9 %  sodium chloride infusion  500 mL Intravenous  Once Mauri Pole, MD        PAST MEDICAL HISTORY: Past Medical History:  Diagnosis Date  . Anxiety   . Back pain   . Chest pain    CLite with apical ischemia in 2006 - normal coronary arteries by [facility] in 12/2004;  Myoview 11/12:  Low risk stress nuclear study with a small, partially reversible apical defect most likely related to apical thinning; cannot R/O very mild apical ischemia.  EF: 75%   . Decreased hearing   . Depression   . Diabetes ([place])   . DVT (deep venous thrombosis) ([place]) 2006   hx of  . Easy bruising   . Ectopic pregnancy with intrauterine pregnancy   . Hiatal hernia   . Hx of blood clots   . Hyperlipidemia   . Joint pain   . Low back pain   . Muscle pain   . PE (pulmonary embolism) 2006   hx of  . Pre-diabetes   . Right shoulder pain   . Shortness of  breath   . Sinus congestion   . Sleep apnea    CPAP   . Sprained ankle   . Swelling of ankle   . Swelling of extremity   . UTI (lower urinary tract infection)    hx    PAST SURGICAL HISTORY: Past Surgical History:  Procedure Laterality Date  . ABDOMINAL EXPLORATION SURGERY    . BUNIONECTOMY     right  . COLONOSCOPY  2009  . ECTOPIC PREGNANCY SURGERY    . FOOT SURGERY    . TUBAL LIGATION      SOCIAL HISTORY: Social History   Tobacco Use  . Smoking status: Former Smoker    Packs/day: 1.00    Years: 50.00    Pack years: 50.00    Types: Cigarettes    Last attempt to quit: 11/21/2009    Years since quitting: 8.0  . Smokeless tobacco: Never Used  Substance Use Topics  . Alcohol use: Yes    Comment: rare 3 times a year per pt  . Drug use: No    FAMILY HISTORY: Family History  Problem Relation Age of Onset  . Heart failure Mother        died from  . Colon polyps Mother   . Hypertension Mother   . Sudden death Mother   . Obesity Mother   . Alcoholism Father   . Diabetes Maternal Museum/gallery exhibitions officer  . Diabetes Maternal Aunt   . Colon cancer Neg Hx   . Esophageal cancer Neg Hx   . Stomach cancer Neg Hx     ROS: Review of Systems  Constitutional: Positive for weight loss.  Gastrointestinal: Negative for nausea and vomiting.  Musculoskeletal:       Negative for muscle weakness  Endo/Heme/Allergies:       Negative for hypoglycemia    PHYSICAL EXAM: Blood pressure 101/67, pulse 76, temperature 97.8 F (36.6 C), height '5\' 2"'  (1.575 m), weight 171 lb (77.6 kg), SpO2 97 %. Body mass index is 31.28 kg/m. Physical Exam  Constitutional: She is oriented to person, place, and time. She appears well-developed and well-nourished.  Cardiovascular: Normal rate.  Pulmonary/Chest: Effort normal.  Musculoskeletal: Normal range of motion.  Neurological: She is oriented to person, place, and time.  Skin: Skin is warm and dry.  Psychiatric: She has a normal mood  and affect. Her behavior is normal.  Vitals reviewed.   RECENT LABS AND  TESTS: BMET    Component Value Date/Time   NA 139 10/24/2017 1013   NA 142 12/28/2016   K 4.2 10/24/2017 1013   CL 99 10/24/2017 1013   CO2 32 10/24/2017 1013   GLUCOSE 98 10/24/2017 1013   BUN 23 10/24/2017 1013   BUN 23 (A) 12/28/2016   CREATININE 0.91 10/24/2017 1013   CREATININE 0.82 10/04/2015 0845   CALCIUM 10.1 10/24/2017 1013   GFRNONAA 71 10/04/2015 0845   GFRAA 82 10/04/2015 0845   Lab Results  Component Value Date   HGBA1C 6.7 (H) 10/24/2017   HGBA1C 6.8 (H) 07/11/2017   HGBA1C 6.8 (H) 04/08/2017   HGBA1C 6.7 12/28/2016   HGBA1C 6.7 (H) 09/27/2016   Lab Results  Component Value Date   INSULIN 13.7 05/27/2017   CBC    Component Value Date/Time   WBC 4.9 10/24/2017 1013   RBC 4.76 10/24/2017 1013   HGB 15.0 10/24/2017 1013   HGB 14.4 05/27/2017 1224   HCT 44.2 10/24/2017 1013   HCT 42.2 05/27/2017 1224   PLT 236.0 10/24/2017 1013   PLT 221 05/27/2017 1224   MCV 92.7 10/24/2017 1013   MCV 90 05/27/2017 1224   MCH 30.6 05/27/2017 1224   MCH 31.5 01/01/2011 0744   MCHC 33.9 10/24/2017 1013   RDW 13.4 10/24/2017 1013   RDW 13.7 05/27/2017 1224   LYMPHSABS 2.4 10/24/2017 1013   LYMPHSABS 2.1 05/27/2017 1224   MONOABS 0.5 10/24/2017 1013   EOSABS 0.2 10/24/2017 1013   EOSABS 0.2 05/27/2017 1224   BASOSABS 0.0 10/24/2017 1013   BASOSABS 0.0 05/27/2017 1224   Iron/TIBC/Ferritin/ %Sat No results found for: IRON, TIBC, FERRITIN, IRONPCTSAT Lipid Panel     Component Value Date/Time   CHOL 198 11/18/2017 0847   TRIG 57 11/18/2017 0847   HDL 113 11/18/2017 0847   CHOLHDL 1.8 11/18/2017 0847   CHOLHDL 2 10/24/2017 1013   VLDL 14.4 10/24/2017 1013   LDLCALC 74 11/18/2017 0847   LDLDIRECT 95.9 10/06/2012 1248   Hepatic Function Panel     Component Value Date/Time   PROT 7.2 10/24/2017 1013   PROT 6.6 05/14/2017 1007   ALBUMIN 4.6 10/24/2017 1013   ALBUMIN 4.3 05/14/2017  1007   AST 18 10/24/2017 1013   ALT 19 10/24/2017 1013   ALKPHOS 85 10/24/2017 1013   BILITOT 1.1 10/24/2017 1013   BILITOT 1.0 05/14/2017 1007   BILIDIR 0.25 05/14/2017 1007   IBILI 0.9 11/15/2015 0831      Component Value Date/Time   TSH 1.250 05/27/2017 1224   TSH 1.27 12/28/2016   TSH 1.00 01/03/2015   TSH 1.61 08/07/2007 0938   TSH 1.08 01/10/2007 0000   Results for GENEVIA, BOULDIN (MRN [id number]) as of 12/05/2017 07:28  Ref. Range 05/27/2017 12:24  Vitamin D, 25-Hydroxy Latest Ref Range: 30.0 - 100.0 ng/mL 45.9   ASSESSMENT AND PLAN: Type 2 diabetes mellitus without complication, without long-term current use of insulin (HCC) - Plan: lisinopril (ZESTRIL) 2.5 MG tablet  Vitamin D deficiency  Class 1 obesity with serious comorbidity and body mass index (BMI) of 30.0 to 30.9 in adult, unspecified obesity type  PLAN:  Diabetes II Sierra Ford has been given extensive diabetes education by myself today including ideal fasting and post-prandial blood glucose readings, individual ideal Hgb A1c goals and hypoglycemia prevention. We discussed the importance of good blood sugar control to decrease the likelihood of diabetic complications such as nephropathy, neuropathy, limb loss, blindness, coronary artery disease, and death. We  discussed the importance of intensive lifestyle modification including diet, exercise and weight loss as the first line treatment for diabetes. Melyna agrees to continue Metformin and start Lisinopril 2.5 mg qPM #30 with no refills and follow up at the agreed upon time.  Vitamin D Deficiency Sierra Ford was informed that low vitamin D levels contributes to fatigue and are associated with obesity, breast, and colon cancer. She agrees to continue to take OTC Vit D '@400'  IU twice daily and will follow up for routine testing of vitamin D, at least 2-3 times per year. She was informed of the risk of over-replacement of vitamin D and agrees to not increase her dose unless she  discusses this with [name] first. We will repeat vitamin D level mid December and Adamarie will follow up at the agreed upon time.  Obesity Sierra Ford is currently in the action stage of change. As such, her goal is to continue with weight loss efforts She has agreed to follow a lower carbohydrate, vegetable and lean protein rich diet plan Sierra Ford has been instructed to work up to a goal of 150 minutes of combined cardio and strengthening exercise per week for weight loss and overall health benefits. We discussed the following Behavioral Modification Strategies today: better snacking choices, planning for success, increasing lean protein intake, increasing vegetables and work on meal planning and easy cooking plans  Sierra Ford has agreed to follow up with our clinic in 2 weeks. She was informed of the importance of frequent follow up visits to maximize her success with intensive lifestyle modifications for her multiple health conditions.   OBESITY BEHAVIORAL INTERVENTION VISIT  Today's visit was # 10   Starting weight: 193 lbs Starting date: 05/27/17 Today's weight : 171 lbs Today's date: 12/03/2017 Total lbs lost to date: 22 At least 15 minutes were spent on discussing the following behavioral intervention visit.   ASK: We discussed the diagnosis of obesity with Sierra Ford today and Sierra Ford agreed to give [name] permission to discuss obesity behavioral modification therapy today.  ASSESS: Sierra Ford has the diagnosis of obesity and her BMI today is 31.27 Sierra Ford is in the action stage of change   ADVISE: Sierra Ford was educated on the multiple health risks of obesity as well as the benefit of weight loss to improve her health. She was advised of the need for long term treatment and the importance of lifestyle modifications to improve her current health and to decrease her risk of future health problems.  AGREE: Multiple dietary modification options and treatment options were discussed and  Sierra Ford agreed to follow  the recommendations documented in the above note.  ARRANGE: Sierra Ford was educated on the importance of frequent visits to treat obesity as outlined per CMS and USPSTF guidelines and agreed to schedule her next follow up appointment today.  I, Doreene Nest, am acting as transcriptionist for Eber Jones, MD  I have reviewed the above documentation for accuracy and completeness, and I agree with the above. - Ilene Qua, MD

## 2017-12-05 NOTE — Progress Notes (Signed)
Subjective:   Patient ID: Sierra Ford, female   DOB: 77 y.o.   MRN: [id number]   HPI Patient presents with inflammation pain of the inner phalangeal joint left fifth toe with keratotic lesion formation   ROS      Objective:  Physical Exam  Inflammatory capsulitis at the inner phalangeal joint digit 5 left with hammertoe deformity and keratotic lesion formation that is painful     Assessment:  Capsulitis left fifth digit inner phalangeal joint with hammertoe deformity and lesion formation secondary to bone pressure     Plan:  H&P x-ray reviewed and today I did a careful inner phalangeal joint injection 1.5 mg dexamethasone 1 mg Kenalog and debrided lesion fully and applied padding to the toe.  Reappoint if symptoms persist and may require surgery for this eventually  X-ray indicates there is mild rotation of the toe with slight enlargement had a proximal phalanx

## 2017-12-09 ENCOUNTER — Telehealth: Payer: Self-pay | Admitting: *Deleted

## 2017-12-09 NOTE — Telephone Encounter (Signed)
Is she taking it now--- her bp was low --- that will make it lower We do not want it lower

## 2017-12-09 NOTE — Telephone Encounter (Signed)
Copied from [place] [phone]. Topic: General - Other >> Dec 09, 2017  1:39 PM [name] wrote: Reason for CRM: the weight lost Dr. that Dr.Lowne referred the pt to, prescribed Lisinopril 2.5 mg. Pt wanted to check with her PcP first to see if this is safe to take.   Can leave a detailed message with the answer if pt does not answer

## 2017-12-10 NOTE — Telephone Encounter (Signed)
Left message on machine to call back  

## 2017-12-10 NOTE — Telephone Encounter (Signed)
Pt states that she is not taking medication.

## 2017-12-10 NOTE — Telephone Encounter (Signed)
Pt returned call stating she has not started the medication. States specialist wanted her on it to protect her kidneys since she is taking metformin. Pt states she cannot stop lasix due to swelling in feet / legs. Advised pt not to start lisinopril per below recommendation and to continue current medications unless Dr Etter Sjogren chase has any further recommendations? If so, pt requests that we leave detailed message on her voicemail.  If no other recommendations, no need to call pt back.  Please advise?

## 2017-12-10 NOTE — Telephone Encounter (Signed)
It is a bp med--- her bp was very low in their office so I would not recommend taking it

## 2017-12-11 NOTE — Telephone Encounter (Signed)
Pt was advised not to start medication yesterday. No further callback needed per below note.

## 2017-12-12 ENCOUNTER — Encounter: Payer: Self-pay | Admitting: Family Medicine

## 2017-12-12 ENCOUNTER — Encounter (INDEPENDENT_AMBULATORY_CARE_PROVIDER_SITE_OTHER): Payer: Self-pay

## 2017-12-12 ENCOUNTER — Ambulatory Visit (INDEPENDENT_AMBULATORY_CARE_PROVIDER_SITE_OTHER): Payer: Medicare PPO | Admitting: Family Medicine

## 2017-12-12 DIAGNOSIS — E785 Hyperlipidemia, unspecified: Secondary | ICD-10-CM | POA: Diagnosis not present

## 2017-12-12 DIAGNOSIS — L309 Dermatitis, unspecified: Secondary | ICD-10-CM | POA: Diagnosis not present

## 2017-12-12 DIAGNOSIS — IMO0002 Reserved for concepts with insufficient information to code with codable children: Secondary | ICD-10-CM

## 2017-12-12 DIAGNOSIS — E1165 Type 2 diabetes mellitus with hyperglycemia: Secondary | ICD-10-CM

## 2017-12-12 DIAGNOSIS — I10 Essential (primary) hypertension: Secondary | ICD-10-CM | POA: Diagnosis not present

## 2017-12-12 DIAGNOSIS — E1151 Type 2 diabetes mellitus with diabetic peripheral angiopathy without gangrene: Secondary | ICD-10-CM | POA: Diagnosis not present

## 2017-12-12 MED ORDER — TRIAMCINOLONE ACETONIDE 0.1 % EX CREA: 1.0000 "application " | TOPICAL_CREAM | Freq: Two times a day (BID) | CUTANEOUS | 2 refills | Status: DC

## 2017-12-12 NOTE — Assessment & Plan Note (Signed)
Tolerating statin, encouraged heart healthy diet, avoid trans fats, minimize simple carbs and saturated fats. Increase exercise as tolerated 

## 2017-12-12 NOTE — Progress Notes (Signed)
Patient ID: Sierra Ford, female    DOB: Jun 27, 1940  Age: 77 y.o. MRN: [id number]    Subjective:  Subjective  HPI Sierra Ford presents for f/u dermatitis-- it is improving with topical steroid cream and vaseline.  No other complaints.    Review of Systems  Constitutional: Negative for chills and fever.  HENT: Negative for congestion and hearing loss.   Eyes: Negative for discharge.  Respiratory: Negative for cough and shortness of breath.   Cardiovascular: Negative for chest pain, palpitations and leg swelling.  Gastrointestinal: Negative for abdominal pain, blood in stool, constipation, diarrhea, nausea and vomiting.  Genitourinary: Negative for dysuria, frequency, hematuria and urgency.  Musculoskeletal: Negative for back pain and myalgias.  Skin: Negative for rash.  Allergic/Immunologic: Negative for environmental allergies.  Neurological: Negative for dizziness, weakness and headaches.  Hematological: Does not bruise/bleed easily.  Psychiatric/Behavioral: Negative for suicidal ideas. The patient is not nervous/anxious.     History Past Medical History:  Diagnosis Date  . Anxiety   . Back pain   . Chest pain    CLite with apical ischemia in 2006 - normal coronary arteries by [facility] in 12/2004;  Myoview 11/12:  Low risk stress nuclear study with a small, partially reversible apical defect most likely related to apical thinning; cannot R/O very mild apical ischemia.  EF: 75%   . Decreased hearing   . Depression   . Diabetes ([place])   . DVT (deep venous thrombosis) ([place]) 2006   hx of  . Easy bruising   . Ectopic pregnancy with intrauterine pregnancy   . Hiatal hernia   . Hx of blood clots   . Hyperlipidemia   . Joint pain   . Low back pain   . Muscle pain   . PE (pulmonary embolism) 2006   hx of  . Pre-diabetes   . Right shoulder pain   . Shortness of breath   . Sinus congestion   . Sleep apnea    CPAP   . Sprained ankle   . Swelling of ankle   . Swelling of  extremity   . UTI (lower urinary tract infection)    hx    She has a past surgical history that includes Foot surgery; Abdominal exploration surgery; Ectopic pregnancy surgery; Bunionectomy; Tubal ligation; and Colonoscopy (2009).   Her family history includes Alcoholism in her father; Colon polyps in her mother; Diabetes in her maternal aunt and maternal aunt; Heart failure in her mother; Hypertension in her mother; Obesity in her mother; Sudden death in her mother.She reports that she quit smoking about 8 years ago. Her smoking use included cigarettes. She has a 50.00 pack-year smoking history. She has never used smokeless tobacco. She reports that she drinks alcohol. She reports that she does not use drugs.  Current Outpatient Medications on File Prior to Visit  Medication Sig Dispense Refill  . aspirin EC 81 MG tablet Take 81 mg by mouth at bedtime.      . blood glucose meter kit and supplies KIT Dispense based on patient and insurance preference. Use up to four times daily as directed. (FOR ICD-9 250.00, 250.01). 1 each 0  . CALCIUM-VITAMIN D PO Take 600 Units by mouth 2 (two) times daily.      . cholecalciferol (VITAMIN D-400) 400 UNITS TABS Take 400 Units by mouth 2 (two) times daily.     . Coenzyme Q10 100 MG TABS Take 100 mg by mouth daily.    Marland Kitchen ezetimibe (ZETIA) 10  MG tablet TAKE 1 TABLET BY MOUTH ONCE DAILY 90 tablet 2  . Flaxseed, Linseed, (FLAXSEED OIL PO) Take by mouth.    . furosemide (LASIX) 40 MG tablet Take 1 tablet (40 mg total) by mouth daily. 90 tablet 1  . metFORMIN (GLUCOPHAGE XR) 500 MG 24 hr tablet Take 1 tablet (500 mg total) by mouth daily with breakfast. 90 tablet 1  . Multiple Vitamin (MULTIVITAMIN PO) Take 1 tablet by mouth at bedtime.     . Omega-3 Fatty Acids (FISH OIL PO) Take by mouth.    Marland Kitchen POTASSIUM CHLORIDE PO Take 1 tablet by mouth as needed.    . rosuvastatin (CRESTOR) 10 MG tablet Take 10 mg by mouth daily.    . TRUE METRIX BLOOD GLUCOSE TEST test strip  TEST  UP  TO FOUR TIMES DAILY AS DIRECTED 200 each 12  . TRUEPLUS LANCETS 33G MISC TEST  UP TO FOUR TIMES DAILY AS DIRECTED 200 each 12  . [DISCONTINUED] omega-3 acid ethyl esters (LOVAZA) 1 G capsule Take 1 g by mouth 3 (three) times daily.      . [DISCONTINUED] pravastatin (PRAVACHOL) 20 MG tablet Take 20 mg by mouth daily.       Current Facility-Administered Medications on File Prior to Visit  Medication Dose Route Frequency Provider Last Rate Last Dose  . 0.9 %  sodium chloride infusion  500 mL Intravenous Once Nandigam, Venia Minks, MD         Objective:  Objective  Physical Exam  Constitutional: She is oriented to person, place, and time. She appears well-developed and well-nourished.  HENT:  Head: Normocephalic and atraumatic.  Eyes: Conjunctivae and EOM are normal.  Neck: Normal range of motion. Neck supple. No JVD present. Carotid bruit is not present. No thyromegaly present.  Cardiovascular: Normal rate, regular rhythm and normal heart sounds.  No murmur heard. Pulmonary/Chest: Effort normal and breath sounds normal. No respiratory distress. She has no wheezes. She has no rales. She exhibits no tenderness.  Musculoskeletal: She exhibits no edema.  Neurological: She is alert and oriented to person, place, and time.  Skin: Skin is dry. No rash noted. No erythema. No pallor.  Psychiatric: She has a normal mood and affect.  Nursing note and vitals reviewed.  BP (!) 106/58 (BP Location: Right Arm, Cuff Size: Normal)   Pulse (!) 55   Temp 98.3 F (36.8 C) (Oral)   Resp 16   Ht '5\' 2"'  (1.575 m)   Wt 182 lb 3.2 oz (82.6 kg)   SpO2 98%   BMI 33.32 kg/m  Wt Readings from Last 3 Encounters:  12/12/17 182 lb 3.2 oz (82.6 kg)  12/03/17 171 lb (77.6 kg)  11/27/17 181 lb 6 oz (82.3 kg)     Lab Results  Component Value Date   WBC 4.9 10/24/2017   HGB 15.0 10/24/2017   HCT 44.2 10/24/2017   PLT 236.0 10/24/2017   GLUCOSE 98 10/24/2017   CHOL 198 11/18/2017   TRIG 57  11/18/2017   HDL 113 11/18/2017   LDLDIRECT 95.9 10/06/2012   LDLCALC 74 11/18/2017   ALT 19 10/24/2017   AST 18 10/24/2017   NA 139 10/24/2017   K 4.2 10/24/2017   CL 99 10/24/2017   CREATININE 0.91 10/24/2017   BUN 23 10/24/2017   CO2 32 10/24/2017   TSH 1.250 05/27/2017   INR 1.03 04/20/2009   HGBA1C 6.7 (H) 10/24/2017   MICROALBUR 1.1 04/08/2017    Mr Lumbar Spine W/o Contrast  Result Date: 05/23/2017 CLINICAL DATA:  Low back pain for 3 months, worse with standing and walking. No injury. EXAM: MRI LUMBAR SPINE WITHOUT CONTRAST TECHNIQUE: Multiplanar, multisequence MR imaging of the lumbar spine was performed. No intravenous contrast was administered. COMPARISON:  Lumbar spine radiograph March 13, 2017 FINDINGS: SEGMENTATION: For the purposes of this report, the last well-formed intervertebral disc is reported as L5-S1. ALIGNMENT: Maintained lumbar lordosis. Grade 1 (4 mm) L3-4 anterolisthesis. No spondylolysis. VERTEBRAE:Vertebral bodies are intact. Moderate to severe L3-4 disc height loss. Remaining disc morphology generally preserved with mild disc desiccation mid lumbar discs. Mild chronic discogenic endplate changes L5-7. No suspicious or acute bone marrow signal. CONUS MEDULLARIS AND CAUDA EQUINA: Conus medullaris terminates at T12-L1 and demonstrates normal morphology and signal characteristics. Cauda equina is normal. PARASPINAL AND OTHER SOFT TISSUES: Nonacute. Asymmetrically prominent T1 and T2 bright fat signal RIGHT paraspinal muscles at lumbosacral junction favoring small intramuscular lipoma. Dependent subcutaneous edema. DISC LEVELS: T12-L1, L1-2: No disc bulge, canal stenosis nor neural foraminal narrowing. Mild facet arthropathy. L2-3: Annular bulging. Moderate facet arthropathy and ligamentum flavum redundancy without canal stenosis or neural foraminal narrowing. L3-4: Anterolisthesis. Moderate to severe facet arthropathy and ligamentum flavum redundancy. Central annular  fissure. Severe canal stenosis, AP dimension of the thecal sac is 5 mm. Lateral recess narrowing may affect the traversing L4 nerves. Moderate bilateral neural foraminal narrowing. L4-5: 3 mm broad-based disc bulge asymmetric to the RIGHT. RIGHT annular fissure. Moderate facet arthropathy and ligamentum flavum redundancy. Mild canal stenosis. Moderate RIGHT, mild-to-moderate LEFT neural foraminal narrowing. L5-S1: 2 mm broad-based disc bulge asymmetric to the RIGHT. Moderate to severe RIGHT, mild LEFT facet arthropathy. No canal stenosis. Minimal RIGHT neural foraminal narrowing. IMPRESSION: 1. Grade 1 L3-4 anterolisthesis without spondylolysis. No fracture or acute osseous process. 2. 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  Mild canal stenosis L4-5. 3. Neural foraminal narrowing L3-4 through L5-S1: Moderate at L3-4 and L4-5. Electronically Signed   By: Elon Alas M.D.   On: 05/23/2017 14:12     Assessment & Plan:  Plan  I have discontinued Izzabelle B. Bronaugh's lisinopril. I am also having her maintain her cholecalciferol, CALCIUM-VITAMIN D PO, Multiple Vitamin (MULTIVITAMIN PO), aspirin EC, Coenzyme Q10, (Flaxseed, Linseed, (FLAXSEED OIL PO)), Omega-3 Fatty Acids (FISH OIL PO), blood glucose meter kit and supplies, rosuvastatin, POTASSIUM CHLORIDE PO, metFORMIN, furosemide, ezetimibe, TRUE METRIX BLOOD GLUCOSE TEST, TRUEPLUS LANCETS 33G, and triamcinolone cream. We will continue to administer sodium chloride.  Meds ordered this encounter  Medications  . triamcinolone cream (KENALOG) 0.1 %    Sig: Apply 1 application topically 2 (two) times daily.    Dispense:  45 g    Refill:  2    Problem List Items Addressed This Visit      Unprioritized   DM (diabetes mellitus) type II uncontrolled, periph vascular disorder ([place])    hgba1c to be checked, minimize simple carbs. Increase exercise as tolerated. Continue current meds       Essential hypertension    Well controlled, no changes to meds. Encouraged  heart healthy diet such as the DASH diet and exercise as tolerated.  On no meds Healthy weight and wellness wanted to put her on lisinopril due to dm but bp running too low        Hyperlipidemia LDL goal <70    Tolerating statin, encouraged heart healthy diet, avoid trans fats, minimize simple carbs and saturated fats. Increase exercise as tolerated       Other Visit Diagnoses  Dermatitis       Relevant Medications   triamcinolone cream (KENALOG) 0.1 %      Follow-up: Return in about 3 months (around 03/14/2018), or if symptoms worsen or fail to improve, for hypertension, hyperlipidemia, diabetes II.  Ann Held, DO

## 2017-12-12 NOTE — Assessment & Plan Note (Signed)
hgba1c to be checked, minimize simple carbs. Increase exercise as tolerated. Continue current meds  

## 2017-12-12 NOTE — Patient Instructions (Signed)

## 2017-12-12 NOTE — Assessment & Plan Note (Signed)
Well controlled, no changes to meds. Encouraged heart healthy diet such as the DASH diet and exercise as tolerated.  On no meds Healthy weight and wellness wanted to put her on lisinopril due to dm but bp running too low

## 2017-12-17 ENCOUNTER — Ambulatory Visit (INDEPENDENT_AMBULATORY_CARE_PROVIDER_SITE_OTHER): Payer: Medicare PPO | Admitting: Family Medicine

## 2017-12-17 ENCOUNTER — Encounter (INDEPENDENT_AMBULATORY_CARE_PROVIDER_SITE_OTHER): Payer: Self-pay

## 2017-12-25 ENCOUNTER — Ambulatory Visit (INDEPENDENT_AMBULATORY_CARE_PROVIDER_SITE_OTHER): Payer: Medicare PPO | Admitting: Family Medicine

## 2017-12-25 ENCOUNTER — Encounter (INDEPENDENT_AMBULATORY_CARE_PROVIDER_SITE_OTHER): Payer: Self-pay | Admitting: Family Medicine

## 2017-12-25 VITALS — BP 105/69 | HR 72 | Temp 98.2°F | Ht 62.0 in | Wt 171.0 lb

## 2017-12-25 DIAGNOSIS — E559 Vitamin D deficiency, unspecified: Secondary | ICD-10-CM | POA: Diagnosis not present

## 2017-12-25 DIAGNOSIS — E669 Obesity, unspecified: Secondary | ICD-10-CM | POA: Diagnosis not present

## 2017-12-25 DIAGNOSIS — E119 Type 2 diabetes mellitus without complications: Secondary | ICD-10-CM

## 2017-12-25 DIAGNOSIS — Z6831 Body mass index (BMI) 31.0-31.9, adult: Secondary | ICD-10-CM | POA: Diagnosis not present

## 2017-12-25 NOTE — Progress Notes (Signed)
Office: [phone]  /  Fax: [phone]   HPI:   Chief Complaint: OBESITY Sierra Ford is here to discuss her progress with her obesity treatment plan. She is following a lower carbohydrate, vegetable and lean protein rich diet plan and is following her eating plan approximately 50 % of the time. She states she is swimming and participating with [employer] 45 minutes 5 times per week. [name] said that recent celebrations have caused her to increase her eating She will be leaving for a [place] cruise in 3 days. Her goal is to lose 8 more lbs overall. Her weight is 171 lb (77.6 kg) today and has not lost weight since her last visit. She has lost 22 lbs since starting treatment with [name].  Diabetes II Sierra Ford has a diagnosis of diabetes type II. Sierra Ford states she occasionally checks sugars and  BGs range between 110 and 115 and denies any hypoglycemic episodes. She is currently stable and diabetes is well-controlled  Last A1c was Hemoglobin A1C Latest Ref Rng & Units 10/24/2017 07/11/2017 04/08/2017 12/28/2016  HGBA1C 4.6 - 6.5 % 6.7(H) 6.8(H) 6.8(H) 6.7  Some recent data might be hidden    She has been working on intensive lifestyle modifications including diet, exercise, and weight loss to help control her blood glucose levels.  Vitamin D deficiency Sierra Ford has a diagnosis of vitamin D deficiency. She is stable taking OTC Vit D and nearly at goal. Sierra Ford denies nausea, vomiting or muscle weakness.   ALLERGIES: No Known Allergies  MEDICATIONS: Current Outpatient Medications on File Prior to Visit  Medication Sig Dispense Refill  . aspirin EC 81 MG tablet Take 81 mg by mouth at bedtime.      . blood glucose meter kit and supplies KIT Dispense based on patient and insurance preference. Use up to four times daily as directed. (FOR ICD-9 250.00, 250.01). 1 each 0  . CALCIUM-VITAMIN D PO Take 600 Units by mouth 2 (two) times daily.      . cholecalciferol (VITAMIN D-400) 400 UNITS TABS Take 400 Units by  mouth 2 (two) times daily.     . Coenzyme Q10 100 MG TABS Take 100 mg by mouth daily.    Marland Kitchen ezetimibe (ZETIA) 10 MG tablet TAKE 1 TABLET BY MOUTH ONCE DAILY 90 tablet 2  . Flaxseed, Linseed, (FLAXSEED OIL PO) Take by mouth.    . furosemide (LASIX) 40 MG tablet Take 1 tablet (40 mg total) by mouth daily. 90 tablet 1  . metFORMIN (GLUCOPHAGE XR) 500 MG 24 hr tablet Take 1 tablet (500 mg total) by mouth daily with breakfast. 90 tablet 1  . Multiple Vitamin (MULTIVITAMIN PO) Take 1 tablet by mouth at bedtime.     . Omega-3 Fatty Acids (FISH OIL PO) Take by mouth.    Marland Kitchen POTASSIUM CHLORIDE PO Take 1 tablet by mouth as needed.    . rosuvastatin (CRESTOR) 10 MG tablet Take 10 mg by mouth daily.    Marland Kitchen triamcinolone cream (KENALOG) 0.1 % Apply 1 application topically 2 (two) times daily. 45 g 2  . TRUE METRIX BLOOD GLUCOSE TEST test strip TEST  UP  TO FOUR TIMES DAILY AS DIRECTED 200 each 12  . TRUEPLUS LANCETS 33G MISC TEST  UP TO FOUR TIMES DAILY AS DIRECTED 200 each 12  . [DISCONTINUED] omega-3 acid ethyl esters (LOVAZA) 1 G capsule Take 1 g by mouth 3 (three) times daily.      . [DISCONTINUED] pravastatin (PRAVACHOL) 20 MG tablet Take 20 mg by  mouth daily.       Current Facility-Administered Medications on File Prior to Visit  Medication Dose Route Frequency Provider Last Rate Last Dose  . 0.9 %  sodium chloride infusion  500 mL Intravenous Once Nandigam, Venia Minks, MD        PAST MEDICAL HISTORY: Past Medical History:  Diagnosis Date  . Anxiety   . Back pain   . Chest pain    CLite with apical ischemia in 2006 - normal coronary arteries by [facility] in 12/2004;  Myoview 11/12:  Low risk stress nuclear study with a small, partially reversible apical defect most likely related to apical thinning; cannot R/O very mild apical ischemia.  EF: 75%   . Decreased hearing   . Depression   . Diabetes ([place])   . DVT (deep venous thrombosis) ([place]) 2006   hx of  . Easy bruising   . Ectopic pregnancy with  intrauterine pregnancy   . Hiatal hernia   . Hx of blood clots   . Hyperlipidemia   . Joint pain   . Low back pain   . Muscle pain   . PE (pulmonary embolism) 2006   hx of  . Pre-diabetes   . Right shoulder pain   . Shortness of breath   . Sinus congestion   . Sleep apnea    CPAP   . Sprained ankle   . Swelling of ankle   . Swelling of extremity   . UTI (lower urinary tract infection)    hx    PAST SURGICAL HISTORY: Past Surgical History:  Procedure Laterality Date  . ABDOMINAL EXPLORATION SURGERY    . BUNIONECTOMY     right  . COLONOSCOPY  2009  . ECTOPIC PREGNANCY SURGERY    . FOOT SURGERY    . TUBAL LIGATION      SOCIAL HISTORY: Social History   Tobacco Use  . Smoking status: Former Smoker    Packs/day: 1.00    Years: 50.00    Pack years: 50.00    Types: Cigarettes    Last attempt to quit: 11/21/2009    Years since quitting: 8.0  . Smokeless tobacco: Never Used  Substance Use Topics  . Alcohol use: Yes    Comment: rare 3 times a year per pt  . Drug use: No    FAMILY HISTORY: Family History  Problem Relation Age of Onset  . Heart failure Mother        died from  . Colon polyps Mother   . Hypertension Mother   . Sudden death Mother   . Obesity Mother   . Alcoholism Father   . Diabetes Maternal Museum/gallery exhibitions officer  . Diabetes Maternal Aunt   . Colon cancer Neg Hx   . Esophageal cancer Neg Hx   . Stomach cancer Neg Hx     ROS: Review of Systems  Constitutional: Positive for malaise/fatigue. Negative for weight loss.  Gastrointestinal: Negative for diarrhea, nausea and vomiting.  Musculoskeletal:       Negative muscle weakness  Endo/Heme/Allergies:       Negative hypoglycemia    PHYSICAL EXAM: Blood pressure 105/69, pulse 72, temperature 98.2 F (36.8 C), temperature source Oral, height '5\' 2"'  (1.575 m), weight 171 lb (77.6 kg), SpO2 100 %. Body mass index is 31.28 kg/m. Physical Exam  Constitutional: She is oriented to person,  place, and time. She appears well-developed and well-nourished.  Cardiovascular: Normal rate.  Pulmonary/Chest: Effort normal.  Musculoskeletal: Normal range of motion.  Neurological: She is alert and oriented to person, place, and time.  Skin: Skin is warm and dry.  Psychiatric: She has a normal mood and affect. Her behavior is normal.  Vitals reviewed.   RECENT LABS AND TESTS: BMET    Component Value Date/Time   NA 139 10/24/2017 1013   NA 142 12/28/2016   K 4.2 10/24/2017 1013   CL 99 10/24/2017 1013   CO2 32 10/24/2017 1013   GLUCOSE 98 10/24/2017 1013   BUN 23 10/24/2017 1013   BUN 23 (A) 12/28/2016   CREATININE 0.91 10/24/2017 1013   CREATININE 0.82 10/04/2015 0845   CALCIUM 10.1 10/24/2017 1013   GFRNONAA 71 10/04/2015 0845   GFRAA 82 10/04/2015 0845   Lab Results  Component Value Date   HGBA1C 6.7 (H) 10/24/2017   HGBA1C 6.8 (H) 07/11/2017   HGBA1C 6.8 (H) 04/08/2017   HGBA1C 6.7 12/28/2016   HGBA1C 6.7 (H) 09/27/2016   Lab Results  Component Value Date   INSULIN 13.7 05/27/2017   CBC    Component Value Date/Time   WBC 4.9 10/24/2017 1013   RBC 4.76 10/24/2017 1013   HGB 15.0 10/24/2017 1013   HGB 14.4 05/27/2017 1224   HCT 44.2 10/24/2017 1013   HCT 42.2 05/27/2017 1224   PLT 236.0 10/24/2017 1013   PLT 221 05/27/2017 1224   MCV 92.7 10/24/2017 1013   MCV 90 05/27/2017 1224   MCH 30.6 05/27/2017 1224   MCH 31.5 01/01/2011 0744   MCHC 33.9 10/24/2017 1013   RDW 13.4 10/24/2017 1013   RDW 13.7 05/27/2017 1224   LYMPHSABS 2.4 10/24/2017 1013   LYMPHSABS 2.1 05/27/2017 1224   MONOABS 0.5 10/24/2017 1013   EOSABS 0.2 10/24/2017 1013   EOSABS 0.2 05/27/2017 1224   BASOSABS 0.0 10/24/2017 1013   BASOSABS 0.0 05/27/2017 1224   Iron/TIBC/Ferritin/ %Sat No results found for: IRON, TIBC, FERRITIN, IRONPCTSAT Lipid Panel     Component Value Date/Time   CHOL 198 11/18/2017 0847   TRIG 57 11/18/2017 0847   HDL 113 11/18/2017 0847   CHOLHDL 1.8  11/18/2017 0847   CHOLHDL 2 10/24/2017 1013   VLDL 14.4 10/24/2017 1013   LDLCALC 74 11/18/2017 0847   LDLDIRECT 95.9 10/06/2012 1248   Hepatic Function Panel     Component Value Date/Time   PROT 7.2 10/24/2017 1013   PROT 6.6 05/14/2017 1007   ALBUMIN 4.6 10/24/2017 1013   ALBUMIN 4.3 05/14/2017 1007   AST 18 10/24/2017 1013   ALT 19 10/24/2017 1013   ALKPHOS 85 10/24/2017 1013   BILITOT 1.1 10/24/2017 1013   BILITOT 1.0 05/14/2017 1007   BILIDIR 0.25 05/14/2017 1007   IBILI 0.9 11/15/2015 0831      Component Value Date/Time   TSH 1.250 05/27/2017 1224   TSH 1.27 12/28/2016   TSH 1.00 01/03/2015   TSH 1.61 08/07/2007 0938   TSH 1.08 01/10/2007 0000   Results for HEATHER, MCKENDREE (MRN [id number]) as of 12/25/2017 17:11  Ref. Range 05/27/2017 12:24  Vitamin D, 25-Hydroxy Latest Ref Range: 30.0 - 100.0 ng/mL 45.9   ASSESSMENT AND PLAN: Type 2 diabetes mellitus without complication, without long-term current use of insulin (HCC)  Vitamin D deficiency  Class 1 obesity with serious comorbidity and body mass index (BMI) of 31.0 to 31.9 in adult, unspecified obesity type  PLAN: Diabetes II Sierra Ford has been given extensive diabetes education by myself today including ideal fasting and post-prandial blood glucose readings,  individual ideal HgA1c goals  and hypoglycemia prevention. We discussed the importance of good blood sugar control to decrease the likelihood of diabetic complications such as nephropathy, neuropathy, limb loss, blindness, coronary artery disease, and death. We discussed the importance of intensive lifestyle modification including diet, exercise and weight loss as the first line treatment for diabetes. Sierra Ford agrees to continue her Metformin. She was prescribed losartan at her last visit for kidney protection but Sierra Ford said that she is not taking the Losartan because her PCP advised against it due to the chance of hypotension. Sierra Ford agrees to follow up with our office in  3 weeks.   Vitamin D Deficiency Sierra Ford was informed that low vitamin D levels contributes to fatigue and are associated with obesity, breast, and colon cancer. She agrees to continue to take OTC Vit D 400 IU BID and will follow up for routine testing of vitamin D, at least 2-3 times per year. She was informed of the risk of over-replacement of vitamin D and agrees to not increase her dose unless she discusses this with [name] first. Sierra Ford agrees to follow up with our office in 3 weeks.   Obesity Sierra Ford is currently in the action stage of change. As such, her goal is to continue with weight loss efforts She has agreed to portion control better and make smarter food choices, such as increase vegetables and decrease simple carbohydrates (while on her cruise) Sierra Ford has been instructed to continue swimming and participating in [place] 45 minutes 5 times per week for weight loss and overall health benefits. We discussed the following Behavioral Modification Strategies today: Travel eating strategies and planning for success  Sierra Ford has agreed to follow up with our clinic in 3 weeks. She was informed of the importance of frequent follow up visits to maximize her success with intensive lifestyle modifications for her multiple health conditions.   OBESITY BEHAVIORAL INTERVENTION VISIT  Today's visit was # 11   Starting weight: 193 lbs Starting date: 05/27/2017 Today's weight : Weight: 171 lb (77.6 kg)  Today's date: 12/25/2017 Total lbs lost to date: 22 lbs  At least 15 minutes were spent on discussing the following behavioral intervention visit.   ASK: We discussed the diagnosis of obesity with Sierra Ford today and Sierra Ford agreed to give [name] permission to discuss obesity behavioral modification therapy today.  ASSESS: Sierra Ford has the diagnosis of obesity and her BMI today is 31.27 Sierra Ford is in the action stage of change   ADVISE: Sierra Ford was educated on the multiple health risks of obesity as  well as the benefit of weight loss to improve her health. She was advised of the need for long term treatment and the importance of lifestyle modifications to improve her current health and to decrease her risk of future health problems.  AGREE: Multiple dietary modification options and treatment options were discussed and  Sierra Ford agreed to follow the recommendations documented in the above note.  ARRANGE: Sierra Ford was educated on the importance of frequent visits to treat obesity as outlined per CMS and USPSTF guidelines and agreed to schedule her next follow up appointment today.  I, Sierra Ford, CMA, am acting as Location manager for [employer], [place].  I have reviewed the above documentation for accuracy and completeness, and I agree with the above.  - Jae Skeet, FNP-C.

## 2017-12-26 ENCOUNTER — Encounter (INDEPENDENT_AMBULATORY_CARE_PROVIDER_SITE_OTHER): Payer: Self-pay | Admitting: Family Medicine

## 2018-01-07 ENCOUNTER — Other Ambulatory Visit: Payer: Self-pay | Admitting: Family Medicine

## 2018-01-07 DIAGNOSIS — E119 Type 2 diabetes mellitus without complications: Secondary | ICD-10-CM

## 2018-01-15 ENCOUNTER — Ambulatory Visit (INDEPENDENT_AMBULATORY_CARE_PROVIDER_SITE_OTHER): Payer: Medicare PPO | Admitting: Family Medicine

## 2018-01-27 DIAGNOSIS — Z01419 Encounter for gynecological examination (general) (routine) without abnormal findings: Secondary | ICD-10-CM | POA: Diagnosis not present

## 2018-01-27 DIAGNOSIS — Z6832 Body mass index (BMI) 32.0-32.9, adult: Secondary | ICD-10-CM | POA: Diagnosis not present

## 2018-01-28 ENCOUNTER — Ambulatory Visit (INDEPENDENT_AMBULATORY_CARE_PROVIDER_SITE_OTHER): Payer: Medicare PPO | Admitting: Family Medicine

## 2018-01-28 ENCOUNTER — Encounter (INDEPENDENT_AMBULATORY_CARE_PROVIDER_SITE_OTHER): Payer: Self-pay | Admitting: Family Medicine

## 2018-01-28 VITALS — BP 111/71 | HR 83 | Temp 98.7°F | Ht 62.0 in | Wt 175.0 lb

## 2018-01-28 DIAGNOSIS — E1159 Type 2 diabetes mellitus with other circulatory complications: Secondary | ICD-10-CM | POA: Diagnosis not present

## 2018-01-28 DIAGNOSIS — Z6832 Body mass index (BMI) 32.0-32.9, adult: Secondary | ICD-10-CM

## 2018-01-28 DIAGNOSIS — E669 Obesity, unspecified: Secondary | ICD-10-CM | POA: Diagnosis not present

## 2018-01-28 DIAGNOSIS — E559 Vitamin D deficiency, unspecified: Secondary | ICD-10-CM

## 2018-01-28 DIAGNOSIS — E119 Type 2 diabetes mellitus without complications: Secondary | ICD-10-CM | POA: Diagnosis not present

## 2018-01-28 NOTE — Progress Notes (Signed)
Office: [phone]  /  Fax: [phone]   HPI:   Chief Complaint: OBESITY Sierra Ford is here to discuss her progress with her obesity treatment plan. She is on the  portion control better and make smarter food choices, such as increase vegetables and decrease simple carbohydrates  and is following her eating plan approximately 20 % of the time. She states she is exercising 0 minutes 0 times per week. Sierra Ford just got back from [employer]. She was off the plan and is not currently back on track. completely. She feels it will be the 1st of the year before she gets back on track totally.  Her weight is 175 lb (79.4 kg) today and has had a weight gain of 4 pounds over a period of 5 weeks since her last visit. She has lost 18 lbs since starting treatment with [name].  Diabetes II Kellina has a diagnosis of diabetes type II, well controlled on metformin. Sierra Ford states fasting BGs are in the 120s and denies any hypoglycemic episodes, nausea, vomiting, or diarrhea. Last A1c was 6.7.  She has been working on intensive lifestyle modifications including diet, exercise, and weight loss to help control her blood glucose levels.  Vitamin D deficiency Sierra Ford has a diagnosis of vitamin D deficiency. She is currently taking over the counter vit D 400 units daily plus calcium with vit D and denies nausea, vomiting or muscle weakness.  Ref. Range 05/27/2017 12:24  Vitamin D, 25-Hydroxy Latest Ref Range: 30.0 - 100.0 ng/mL 45.9   ALLERGIES: No Known Allergies  MEDICATIONS: Current Outpatient Medications on File Prior to Visit  Medication Sig Dispense Refill  . aspirin EC 81 MG tablet Take 81 mg by mouth at bedtime.      . blood glucose meter kit and supplies KIT Dispense based on patient and insurance preference. Use up to four times daily as directed. (FOR ICD-9 250.00, 250.01). 1 each 0  . CALCIUM-VITAMIN D PO Take 600 Units by mouth 2 (two) times daily.      . cholecalciferol (VITAMIN D-400) 400 UNITS TABS Take  400 Units by mouth 2 (two) times daily.     . Coenzyme Q10 100 MG TABS Take 100 mg by mouth daily.    Marland Kitchen ezetimibe (ZETIA) 10 MG tablet TAKE 1 TABLET BY MOUTH ONCE DAILY 90 tablet 2  . Flaxseed, Linseed, (FLAXSEED OIL PO) Take by mouth.    . furosemide (LASIX) 40 MG tablet Take 1 tablet (40 mg total) by mouth daily. 90 tablet 1  . metFORMIN (GLUCOPHAGE-XR) 500 MG 24 hr tablet TAKE 1 TABLET BY MOUTH ONCE DAILY WITH BREAKFAST 90 tablet 1  . Multiple Vitamin (MULTIVITAMIN PO) Take 1 tablet by mouth at bedtime.     . Omega-3 Fatty Acids (FISH OIL PO) Take by mouth.    Marland Kitchen POTASSIUM CHLORIDE PO Take 1 tablet by mouth as needed.    . rosuvastatin (CRESTOR) 10 MG tablet Take 10 mg by mouth daily.    Marland Kitchen triamcinolone cream (KENALOG) 0.1 % Apply 1 application topically 2 (two) times daily. 45 g 2  . TRUE METRIX BLOOD GLUCOSE TEST test strip TEST  UP  TO FOUR TIMES DAILY AS DIRECTED 200 each 12  . TRUEPLUS LANCETS 33G MISC TEST  UP TO FOUR TIMES DAILY AS DIRECTED 200 each 12  . [DISCONTINUED] omega-3 acid ethyl esters (LOVAZA) 1 G capsule Take 1 g by mouth 3 (three) times daily.      . [DISCONTINUED] pravastatin (PRAVACHOL) 20 MG tablet  Take 20 mg by mouth daily.       Current Facility-Administered Medications on File Prior to Visit  Medication Dose Route Frequency Provider Last Rate Last Dose  . 0.9 %  sodium chloride infusion  500 mL Intravenous Once Nandigam, Venia Minks, MD        PAST MEDICAL HISTORY: Past Medical History:  Diagnosis Date  . Anxiety   . Back pain   . Chest pain    CLite with apical ischemia in 2006 - normal coronary arteries by [facility] in 12/2004;  Myoview 11/12:  Low risk stress nuclear study with a small, partially reversible apical defect most likely related to apical thinning; cannot R/O very mild apical ischemia.  EF: 75%   . Decreased hearing   . Depression   . Diabetes ([place])   . DVT (deep venous thrombosis) ([place]) 2006   hx of  . Easy bruising   . Ectopic pregnancy with  intrauterine pregnancy   . Hiatal hernia   . Hx of blood clots   . Hyperlipidemia   . Joint pain   . Low back pain   . Muscle pain   . PE (pulmonary embolism) 2006   hx of  . Pre-diabetes   . Right shoulder pain   . Shortness of breath   . Sinus congestion   . Sleep apnea    CPAP   . Sprained ankle   . Swelling of ankle   . Swelling of extremity   . UTI (lower urinary tract infection)    hx    PAST SURGICAL HISTORY: Past Surgical History:  Procedure Laterality Date  . ABDOMINAL EXPLORATION SURGERY    . BUNIONECTOMY     right  . COLONOSCOPY  2009  . ECTOPIC PREGNANCY SURGERY    . FOOT SURGERY    . TUBAL LIGATION      SOCIAL HISTORY: Social History   Tobacco Use  . Smoking status: Former Smoker    Packs/day: 1.00    Years: 50.00    Pack years: 50.00    Types: Cigarettes    Last attempt to quit: 11/21/2009    Years since quitting: 8.1  . Smokeless tobacco: Never Used  Substance Use Topics  . Alcohol use: Yes    Comment: rare 3 times a year per pt  . Drug use: No    FAMILY HISTORY: Family History  Problem Relation Age of Onset  . Heart failure Mother        died from  . Colon polyps Mother   . Hypertension Mother   . Sudden death Mother   . Obesity Mother   . Alcoholism Father   . Diabetes Maternal Museum/gallery exhibitions officer  . Diabetes Maternal Aunt   . Colon cancer Neg Hx   . Esophageal cancer Neg Hx   . Stomach cancer Neg Hx     ROS: Review of Systems  Constitutional: Negative for weight loss.  Gastrointestinal: Negative for diarrhea, nausea and vomiting.  Musculoskeletal:       Negative for muscle weakness  Endo/Heme/Allergies:       Negative for hypoglycemia    PHYSICAL EXAM: Blood pressure 111/71, pulse 83, temperature 98.7 F (37.1 C), temperature source Oral, height '5\' 2"'  (1.575 m), weight 175 lb (79.4 kg), SpO2 97 %. Body mass index is 32.01 kg/m. Physical Exam  Constitutional: She is oriented to person, place, and time. She  appears well-developed and well-nourished.  HENT:  Head: Normocephalic.  Eyes: Pupils are equal, round, and reactive to light.  Neck: Normal range of motion.  Cardiovascular: Normal rate.  Pulmonary/Chest: Effort normal.  Musculoskeletal: Normal range of motion.  Neurological: She is alert and oriented to person, place, and time.  Skin: Skin is warm and dry.  Psychiatric: She has a normal mood and affect. Her behavior is normal.  Vitals reviewed.   RECENT LABS AND TESTS: BMET    Component Value Date/Time   NA 139 10/24/2017 1013   NA 142 12/28/2016   K 4.2 10/24/2017 1013   CL 99 10/24/2017 1013   CO2 32 10/24/2017 1013   GLUCOSE 98 10/24/2017 1013   BUN 23 10/24/2017 1013   BUN 23 (A) 12/28/2016   CREATININE 0.91 10/24/2017 1013   CREATININE 0.82 10/04/2015 0845   CALCIUM 10.1 10/24/2017 1013   GFRNONAA 71 10/04/2015 0845   GFRAA 82 10/04/2015 0845   Lab Results  Component Value Date   HGBA1C 6.7 (H) 10/24/2017   HGBA1C 6.8 (H) 07/11/2017   HGBA1C 6.8 (H) 04/08/2017   HGBA1C 6.7 12/28/2016   HGBA1C 6.7 (H) 09/27/2016   Lab Results  Component Value Date   INSULIN 13.7 05/27/2017   CBC    Component Value Date/Time   WBC 4.9 10/24/2017 1013   RBC 4.76 10/24/2017 1013   HGB 15.0 10/24/2017 1013   HGB 14.4 05/27/2017 1224   HCT 44.2 10/24/2017 1013   HCT 42.2 05/27/2017 1224   PLT 236.0 10/24/2017 1013   PLT 221 05/27/2017 1224   MCV 92.7 10/24/2017 1013   MCV 90 05/27/2017 1224   MCH 30.6 05/27/2017 1224   MCH 31.5 01/01/2011 0744   MCHC 33.9 10/24/2017 1013   RDW 13.4 10/24/2017 1013   RDW 13.7 05/27/2017 1224   LYMPHSABS 2.4 10/24/2017 1013   LYMPHSABS 2.1 05/27/2017 1224   MONOABS 0.5 10/24/2017 1013   EOSABS 0.2 10/24/2017 1013   EOSABS 0.2 05/27/2017 1224   BASOSABS 0.0 10/24/2017 1013   BASOSABS 0.0 05/27/2017 1224   Iron/TIBC/Ferritin/ %Sat No results found for: IRON, TIBC, FERRITIN, IRONPCTSAT Lipid Panel     Component Value Date/Time    CHOL 198 11/18/2017 0847   TRIG 57 11/18/2017 0847   HDL 113 11/18/2017 0847   CHOLHDL 1.8 11/18/2017 0847   CHOLHDL 2 10/24/2017 1013   VLDL 14.4 10/24/2017 1013   LDLCALC 74 11/18/2017 0847   LDLDIRECT 95.9 10/06/2012 1248   Hepatic Function Panel     Component Value Date/Time   PROT 7.2 10/24/2017 1013   PROT 6.6 05/14/2017 1007   ALBUMIN 4.6 10/24/2017 1013   ALBUMIN 4.3 05/14/2017 1007   AST 18 10/24/2017 1013   ALT 19 10/24/2017 1013   ALKPHOS 85 10/24/2017 1013   BILITOT 1.1 10/24/2017 1013   BILITOT 1.0 05/14/2017 1007   BILIDIR 0.25 05/14/2017 1007   IBILI 0.9 11/15/2015 0831      Component Value Date/Time   TSH 1.250 05/27/2017 1224   TSH 1.27 12/28/2016   TSH 1.00 01/03/2015   TSH 1.61 08/07/2007 0938   TSH 1.08 01/10/2007 0000    Ref. Range 05/27/2017 12:24  Vitamin D, 25-Hydroxy Latest Ref Range: 30.0 - 100.0 ng/mL 45.9    ASSESSMENT AND PLAN: Type 2 diabetes mellitus without complication, without long-term current use of insulin (HCC) - Plan: Comprehensive metabolic panel, Hemoglobin A1c, Insulin, random  Vitamin D deficiency - Plan: VITAMIN D 25 Hydroxy (Vit-D Deficiency, Fractures)  Class 1 obesity with serious comorbidity and body mass index (  BMI) of 32.0 to 32.9 in adult, unspecified obesity type  PLAN: Diabetes II Latalia has been given extensive diabetes education by myself today including ideal fasting and post-prandial blood glucose readings, individual ideal HgA1c goals  and hypoglycemia prevention. We discussed the importance of good blood sugar control to decrease the likelihood of diabetic complications such as nephropathy, neuropathy, limb loss, blindness, coronary artery disease, and death. We discussed the importance of intensive lifestyle modification including diet, exercise and weight loss as the first line treatment for diabetes. Masayo agrees to continue her diabetes medications and will follow up at the agreed upon time. We will repeat fating  glucose, insulin, and HgA1c today.   Vitamin D Deficiency Amaiyah was informed that low vitamin D levels contributes to fatigue and are associated with obesity, breast, and colon cancer. She agrees to continue to take over the counter vit D and will follow up for routine testing of vitamin D, at least 2-3 times per year. She was informed of the risk of over-replacement of vitamin D and agrees to not increase her dose unless she discusses this with [name] first. We will repeat vit D level today. Agrees to follow up with our clinic as directed.   Obesity Jariana is currently in the action stage of change. As such, her goal is to continue with weight loss efforts She has agreed to portion control better and make smarter food choices, such as increase vegetables and decrease simple carbohydrates  Caralina has been instructed to start back on exercise program of swimming and silver sneaker classes for weight loss and overall health benefits. We discussed the following Behavioral Modification Strategies today: increasing lean protein intake, keeping healthy foods in the home, planning for success, better snacking choices, and holiday eating strategies.    Jaycey has agreed to follow up with our clinic in 2-3 weeks. She was informed of the importance of frequent follow up visits to maximize her success with intensive lifestyle modifications for her multiple health conditions.   OBESITY BEHAVIORAL INTERVENTION VISIT  Today's visit was # 11   Starting weight: 193 lb Starting date: 05/27/17 Today's weight : Weight: 175 lb (79.4 kg)  Today's date: 01/28/2018 Total lbs lost to date: 18 lb At least 15 minutes were spent on discussing the following behavioral intervention visit.   ASK: We discussed the diagnosis of obesity with [name] today and Kaci agreed to give [name] permission to discuss obesity behavioral modification therapy today.  ASSESS: Layken has the diagnosis of obesity and her BMI today is  33 Berneda is in the action stage of change   ADVISE: Kathia was educated on the multiple health risks of obesity as well as the benefit of weight loss to improve her health. She was advised of the need for long term treatment and the importance of lifestyle modifications to improve her current health and to decrease her risk of future health problems.  AGREE: Multiple dietary modification options and treatment options were discussed and  Tylicia agreed to follow the recommendations documented in the above note.  ARRANGE: Melaine was educated on the importance of frequent visits to treat obesity as outlined per CMS and USPSTF guidelines and agreed to schedule her next follow up appointment today.  I, Renee Ramus, am acting as Location manager for [employer], FNP-C.  I have reviewed the above documentation for accuracy and completeness, and I agree with the above.  - Sereen Schaff, FNP-C.

## 2018-01-29 ENCOUNTER — Ambulatory Visit (HOSPITAL_BASED_OUTPATIENT_CLINIC_OR_DEPARTMENT_OTHER)
Admission: RE | Admit: 2018-01-29 | Discharge: 2018-01-29 | Disposition: A | Discharge status: Home / Self Care | Payer: Medicare PPO | Source: Ambulatory Visit | Attending: Medical | Admitting: Medical

## 2018-01-29 ENCOUNTER — Ambulatory Visit (INDEPENDENT_AMBULATORY_CARE_PROVIDER_SITE_OTHER): Payer: Medicare PPO | Admitting: Medical

## 2018-01-29 ENCOUNTER — Encounter: Payer: Self-pay | Admitting: Medical

## 2018-01-29 VITALS — BP 113/60 | HR 83 | Temp 99.8°F | Resp 16 | Ht 62.0 in | Wt 178.0 lb

## 2018-01-29 DIAGNOSIS — R05 Cough: Secondary | ICD-10-CM | POA: Diagnosis not present

## 2018-01-29 DIAGNOSIS — J40 Bronchitis, not specified as acute or chronic: Secondary | ICD-10-CM

## 2018-01-29 DIAGNOSIS — R059 Cough, unspecified: Secondary | ICD-10-CM

## 2018-01-29 LAB — COMPREHENSIVE METABOLIC PANEL
ALT: 27 IU/L (ref 0–32)
AST: 29 IU/L (ref 0–40)
Albumin/Globulin Ratio: 2.1 (ref 1.2–2.2)
Albumin: 5 g/dL — ABNORMAL HIGH (ref 3.5–4.8)
Alkaline Phosphatase: 106 IU/L (ref 39–117)
BUN/Creatinine Ratio: 19 (ref 12–28)
BUN: 21 mg/dL (ref 8–27)
Bilirubin Total: 1.5 mg/dL — ABNORMAL HIGH (ref 0.0–1.2)
CO2: 24 mmol/L (ref 20–29)
Calcium: 10.2 mg/dL (ref 8.7–10.3)
Chloride: 97 mmol/L (ref 96–106)
Creatinine, Ser: 1.09 mg/dL — ABNORMAL HIGH (ref 0.57–1.00)
GFR calc Af Amer: 57 mL/min/{1.73_m2} — ABNORMAL LOW (ref >59)
GFR calc non Af Amer: 49 mL/min/{1.73_m2} — ABNORMAL LOW (ref >59)
Globulin, Total: 2.4 g/dL (ref 1.5–4.5)
Glucose: 116 mg/dL — ABNORMAL HIGH (ref 65–99)
Potassium: 4.3 mmol/L (ref 3.5–5.2)
Sodium: 141 mmol/L (ref 134–144)
Total Protein: 7.4 g/dL (ref 6.0–8.5)

## 2018-01-29 LAB — VITAMIN D 25 HYDROXY (VIT D DEFICIENCY, FRACTURES): Vit D, 25-Hydroxy: 39.1 ng/mL (ref 30.0–100.0)

## 2018-01-29 LAB — HEMOGLOBIN A1C
Est. average glucose Bld gHb Est-mCnc: 140 mg/dL
Hgb A1c MFr Bld: 6.5 % — ABNORMAL HIGH (ref 4.8–5.6)

## 2018-01-29 LAB — INSULIN, RANDOM: INSULIN: 19.2 u[IU]/mL (ref 2.6–24.9)

## 2018-01-29 MED ORDER — HYDROCODONE-HOMATROPINE 5-1.5 MG/5ML PO SYRP: 5.0000 mL | ORAL_SOLUTION | Freq: Four times a day (QID) | ORAL | 0 refills | Status: DC | PRN

## 2018-01-29 MED ORDER — DOXYCYCLINE HYCLATE 100 MG PO TABS: 100.0000 mg | ORAL_TABLET | Freq: Two times a day (BID) | ORAL | 0 refills | Status: DC

## 2018-01-29 MED FILL — HYDROCODONE-HOMATROPINE SOL: 5-1.5 | 5 days supply | Qty: 100 | Fill #0

## 2018-01-29 MED FILL — DOXYCYCLINE HYCLATE 100 MG: 100 | 10 days supply | Qty: 20 | Fill #0

## 2018-01-29 NOTE — Patient Instructions (Addendum)
You appear to have bronchitis. Rest hydrate and tylenol for fever. I am prescribing cough medicine hycodan, and doxycycline antibiotic. Rx advisement given  Please get chest xray today.  Follow up in 7-10 days or as needed

## 2018-01-29 NOTE — Progress Notes (Signed)
Subjective:    Patient ID: Sierra Ford, female    DOB: 04/23/1940, 77 y.o.   MRN: [id number]  HPI  Pt in for cough x 2 days. Pt states some productive cough. She states feels some fever and sweating last night. Pt has felt tired for 2 days. Cough is keeping her up at night. States coughing so much her head hurts and ribs also hurt.  Not reporting nasal congestion.  No sinus pain.  No wheezing.  Pt states hx of bronchitis 10 yrs or more ago when she was smoking. She stopped smoking 10 years.      Review of Systems  Constitutional: Negative for chills, fatigue and fever.  HENT: Negative for congestion, drooling, facial swelling, mouth sores, postnasal drip and sinus pain.   Respiratory: Positive for cough. Negative for chest tightness, shortness of breath and wheezing.        Chest congestion.  Cardiovascular: Negative for chest pain and palpitations.  Gastrointestinal: Negative for abdominal pain.  Genitourinary: Negative for dysuria and frequency.  Musculoskeletal: Negative for back pain and myalgias.  Skin: Negative for rash.  Neurological: Negative for dizziness and headaches.  Hematological: Negative for adenopathy. Does not bruise/bleed easily.  Psychiatric/Behavioral: Negative for behavioral problems and confusion.    Past Medical History:  Diagnosis Date  . Anxiety   . Back pain   . Chest pain    CLite with apical ischemia in 2006 - normal coronary arteries by [facility] in 12/2004;  Myoview 11/12:  Low risk stress nuclear study with a small, partially reversible apical defect most likely related to apical thinning; cannot R/O very mild apical ischemia.  EF: 75%   . Decreased hearing   . Depression   . Diabetes ([place])   . DVT (deep venous thrombosis) ([place]) 2006   hx of  . Easy bruising   . Ectopic pregnancy with intrauterine pregnancy   . Hiatal hernia   . Hx of blood clots   . Hyperlipidemia   . Joint pain   . Low back pain   . Muscle pain   . PE (pulmonary  embolism) 2006   hx of  . Pre-diabetes   . Right shoulder pain   . Shortness of breath   . Sinus congestion   . Sleep apnea    CPAP   . Sprained ankle   . Swelling of ankle   . Swelling of extremity   . UTI (lower urinary tract infection)    hx     Social History   Socioeconomic History  . Marital status: Divorced    Spouse name: Not on file  . Number of children: Not on file  . Years of education: Not on file  . Highest education level: Not on file  Occupational History  . Occupation: Retired Scientist, research (medical)  Social Needs  . Financial resource strain: Not on file  . Food insecurity:    Worry: Not on file    Inability: Not on file  . Transportation needs:    Medical: Not on file    Non-medical: Not on file  Tobacco Use  . Smoking status: Former Smoker    Packs/day: 1.00    Years: 50.00    Pack years: 50.00    Types: Cigarettes    Last attempt to quit: 11/21/2009    Years since quitting: 8.1  . Smokeless tobacco: Never Used  Substance and Sexual Activity  . Alcohol use: Yes    Comment: rare 3 times a year  per pt  . Drug use: No  . Sexual activity: Not Currently    Partners: Male  Lifestyle  . Physical activity:    Days per week: Not on file    Minutes per session: Not on file  . Stress: Not on file  Relationships  . Social connections:    Talks on phone: Not on file    Gets together: Not on file    Attends religious service: Not on file    Active member of club or organization: Not on file    Attends meetings of clubs or organizations: Not on file    Relationship status: Not on file  . Intimate partner violence:    Fear of current or ex partner: Not on file    Emotionally abused: Not on file    Physically abused: Not on file    Forced sexual activity: Not on file  Other Topics Concern  . Not on file  Social History Narrative   Regular exercise          Past Surgical History:  Procedure Laterality Date  . ABDOMINAL EXPLORATION SURGERY    .  BUNIONECTOMY     right  . COLONOSCOPY  2009  . ECTOPIC PREGNANCY SURGERY    . FOOT SURGERY    . TUBAL LIGATION      Family History  Problem Relation Age of Onset  . Heart failure Mother        died from  . Colon polyps Mother   . Hypertension Mother   . Sudden death Mother   . Obesity Mother   . Alcoholism Father   . Diabetes Maternal Museum/gallery exhibitions officer  . Diabetes Maternal Aunt   . Colon cancer Neg Hx   . Esophageal cancer Neg Hx   . Stomach cancer Neg Hx     No Known Allergies  Current Outpatient Medications on File Prior to Visit  Medication Sig Dispense Refill  . aspirin EC 81 MG tablet Take 81 mg by mouth at bedtime.      . blood glucose meter kit and supplies KIT Dispense based on patient and insurance preference. Use up to four times daily as directed. (FOR ICD-9 250.00, 250.01). 1 each 0  . CALCIUM-VITAMIN D PO Take 600 Units by mouth 2 (two) times daily.      . cholecalciferol (VITAMIN D-400) 400 UNITS TABS Take 400 Units by mouth 2 (two) times daily.     . Coenzyme Q10 100 MG TABS Take 100 mg by mouth daily.    Marland Kitchen ezetimibe (ZETIA) 10 MG tablet TAKE 1 TABLET BY MOUTH ONCE DAILY 90 tablet 2  . Flaxseed, Linseed, (FLAXSEED OIL PO) Take by mouth.    . furosemide (LASIX) 40 MG tablet Take 1 tablet (40 mg total) by mouth daily. 90 tablet 1  . metFORMIN (GLUCOPHAGE-XR) 500 MG 24 hr tablet TAKE 1 TABLET BY MOUTH ONCE DAILY WITH BREAKFAST 90 tablet 1  . Multiple Vitamin (MULTIVITAMIN PO) Take 1 tablet by mouth at bedtime.     . Omega-3 Fatty Acids (FISH OIL PO) Take by mouth.    Marland Kitchen POTASSIUM CHLORIDE PO Take 1 tablet by mouth as needed.    . rosuvastatin (CRESTOR) 10 MG tablet Take 10 mg by mouth daily.    Marland Kitchen triamcinolone cream (KENALOG) 0.1 % Apply 1 application topically 2 (two) times daily. 45 g 2  . TRUE METRIX BLOOD GLUCOSE TEST test strip TEST  UP  TO  FOUR TIMES DAILY AS DIRECTED 200 each 12  . TRUEPLUS LANCETS 33G MISC TEST  UP TO FOUR TIMES DAILY AS DIRECTED  200 each 12  . [DISCONTINUED] omega-3 acid ethyl esters (LOVAZA) 1 G capsule Take 1 g by mouth 3 (three) times daily.      . [DISCONTINUED] pravastatin (PRAVACHOL) 20 MG tablet Take 20 mg by mouth daily.       Current Facility-Administered Medications on File Prior to Visit  Medication Dose Route Frequency Provider Last Rate Last Dose  . 0.9 %  sodium chloride infusion  500 mL Intravenous Once Nandigam, Kavitha V, MD        BP 113/60   Pulse 83   Temp 99.8 F (37.7 C) (Oral)   Resp 16   Ht '5\' 2"'  (1.575 m)   Wt 178 lb (80.7 kg)   SpO2 98%   BMI 32.56 kg/m       Objective:   Physical Exam  General  Mental Status - Alert. General Appearance - Well groomed. Not in acute distress.  Skin Rashes- No Rashes.  HEENT Head- Normal. Ear Auditory Canal - Left- Normal. Right - Normal.Tympanic Membrane- Left- Normal. Right- Normal. Eye Sclera/Conjunctiva- Left- Normal. Right- Normal. Nose & Sinuses Nasal Mucosa- Left-  Boggy and Congested. Right-  Boggy and  Congested.Bilateral  No maxillary and No  frontal sinus pressure. Mouth & Throat Lips: Upper Lip- Normal: no dryness, cracking, pallor, cyanosis, or vesicular eruption. Lower Lip-Normal: no dryness, cracking, pallor, cyanosis or vesicular eruption. Buccal Mucosa- Bilateral- No Aphthous ulcers. Oropharynx- No Discharge or Erythema. Tonsils: Characteristics- Bilateral- No Erythema or Congestion. Size/Enlargement- Bilateral- No enlargement. Discharge- bilateral-None.  Neck Neck- Supple. No Masses.   Chest and Lung Exam Auscultation: Breath Sounds:-Clear even and unlabored.  Cardiovascular Auscultation:Rythm- Regular, rate and rhythm. Murmurs & Other Heart Sounds:Ausculatation of the heart reveal- No Murmurs.  Lymphatic Head & Neck General Head & Neck Lymphatics: Bilateral: Description- No Localized lymphadenopathy.   .      Assessment & Plan:  You appear to have bronchitis. Rest hydrate and tylenol for fever. I am  prescribing cough medicine hycodan, and doxycycline antibiotic.   Please get chest xray today.  Follow up in 7-10 days or as needed  [employer], [employer]

## 2018-01-30 ENCOUNTER — Telehealth: Payer: Self-pay | Admitting: Family Medicine

## 2018-01-30 NOTE — Telephone Encounter (Signed)
Copied from [place] [id number]. Topic: General - Other >> Jan 30, 2018  3:31 PM Blase Mess A wrote: Reason for CRM: Patient is calling back to receive the results of her chest x ray. Please [phone]

## 2018-02-05 NOTE — Telephone Encounter (Signed)
Left message on machine with results and to call back with any questions

## 2018-02-17 ENCOUNTER — Ambulatory Visit (INDEPENDENT_AMBULATORY_CARE_PROVIDER_SITE_OTHER): Payer: Medicare PPO | Admitting: Family Medicine

## 2018-02-17 ENCOUNTER — Encounter (INDEPENDENT_AMBULATORY_CARE_PROVIDER_SITE_OTHER): Payer: Self-pay | Admitting: Family Medicine

## 2018-02-17 VITALS — BP 119/59 | HR 83 | Temp 97.6°F | Ht 62.0 in | Wt 181.0 lb

## 2018-02-17 DIAGNOSIS — M545 Low back pain, unspecified: Secondary | ICD-10-CM

## 2018-02-17 DIAGNOSIS — I5032 Chronic diastolic (congestive) heart failure: Secondary | ICD-10-CM

## 2018-02-17 DIAGNOSIS — E669 Obesity, unspecified: Secondary | ICD-10-CM | POA: Diagnosis not present

## 2018-02-17 DIAGNOSIS — N183 Chronic kidney disease, stage 3 unspecified: Secondary | ICD-10-CM

## 2018-02-17 DIAGNOSIS — E559 Vitamin D deficiency, unspecified: Secondary | ICD-10-CM | POA: Diagnosis not present

## 2018-02-17 DIAGNOSIS — Z6833 Body mass index (BMI) 33.0-33.9, adult: Secondary | ICD-10-CM | POA: Diagnosis not present

## 2018-02-17 MED ORDER — VITAMIN D (ERGOCALCIFEROL) 1.25 MG (50000 UNIT) PO CAPS: 50000.0000 [IU] | ORAL_CAPSULE | ORAL | 0 refills | Status: DC

## 2018-02-17 NOTE — Progress Notes (Signed)
Office: [phone]  /  Fax: [phone]   HPI:   Chief Complaint: OBESITY Sierra Ford is here to discuss her progress with her obesity treatment plan. She is on the portion control better and make smarter food choices, such as increase vegetables and decrease simple carbohydrates and is following her eating plan approximately 20% of the time. She states she is exercising 0 minutes 0 times per week. Sierra Ford recently traveled to [place] and was off track. She plans on getting back on track this week.  Her weight is 181 lb (82.1 kg) today and has gained 6 pounds since her last visit. She has lost 12 lbs since starting treatment with [name].  Chronic Kidney Disease, Stage 3 Sierra Ford's GFR was 57 on 01/28/18. She is on ASA and Lasix for congestive heart failure, which may be decreasing her kidney function.   Vitamin D Deficiency Sierra Ford has a diagnosis of vitamin D deficiency. She is on OTC Vit D, level decreased at last check on 01/28/18. She denies fatigue, nausea, vomiting or muscle weakness.  Chronic Diastolic Heart Failure Sierra Ford noted increased shortness of breath while on her recent trip. She is on ASA, Crestor, Zetia, and Lasix per Has cardiology appointment scheduled fin April 2020.  Lower Back Pain Sierra Ford complains of lower back pain for the last few months which is worsening. She denies pain or numbness in legs.  ALLERGIES: No Known Allergies  MEDICATIONS: Current Outpatient Medications on File Prior to Visit  Medication Sig Dispense Refill  . aspirin EC 81 MG tablet Take 81 mg by mouth at bedtime.      . blood glucose meter kit and supplies KIT Dispense based on patient and insurance preference. Use up to four times daily as directed. (FOR ICD-9 250.00, 250.01). 1 each 0  . CALCIUM-VITAMIN D PO Take 600 Units by mouth 2 (two) times daily.      . cholecalciferol (VITAMIN D-400) 400 UNITS TABS Take 400 Units by mouth 2 (two) times daily.     . Coenzyme Q10 100 MG TABS Take 100 mg by  mouth daily.    Marland Kitchen doxycycline (VIBRA-TABS) 100 MG tablet Take 1 tablet (100 mg total) by mouth 2 (two) times daily. Can give caps or generic 20 tablet 0  . ezetimibe (ZETIA) 10 MG tablet TAKE 1 TABLET BY MOUTH ONCE DAILY 90 tablet 2  . Flaxseed, Linseed, (FLAXSEED OIL PO) Take by mouth.    . furosemide (LASIX) 40 MG tablet Take 1 tablet (40 mg total) by mouth daily. 90 tablet 1  . HYDROcodone-homatropine (HYCODAN) 5-1.5 MG/5ML syrup Take 5 mLs by mouth every 6 (six) hours as needed for cough. 100 mL 0  . metFORMIN (GLUCOPHAGE-XR) 500 MG 24 hr tablet TAKE 1 TABLET BY MOUTH ONCE DAILY WITH BREAKFAST 90 tablet 1  . Multiple Vitamin (MULTIVITAMIN PO) Take 1 tablet by mouth at bedtime.     . Omega-3 Fatty Acids (FISH OIL PO) Take by mouth.    Marland Kitchen POTASSIUM CHLORIDE PO Take 1 tablet by mouth as needed.    . rosuvastatin (CRESTOR) 10 MG tablet Take 10 mg by mouth daily.    Marland Kitchen triamcinolone cream (KENALOG) 0.1 % Apply 1 application topically 2 (two) times daily. 45 g 2  . TRUE METRIX BLOOD GLUCOSE TEST test strip TEST  UP  TO FOUR TIMES DAILY AS DIRECTED 200 each 12  . TRUEPLUS LANCETS 33G MISC TEST  UP TO FOUR TIMES DAILY AS DIRECTED 200 each 12  . [DISCONTINUED] omega-3 acid ethyl  esters (LOVAZA) 1 G capsule Take 1 g by mouth 3 (three) times daily.      . [DISCONTINUED] pravastatin (PRAVACHOL) 20 MG tablet Take 20 mg by mouth daily.       Current Facility-Administered Medications on File Prior to Visit  Medication Dose Route Frequency Provider Last Rate Last Dose  . 0.9 %  sodium chloride infusion  500 mL Intravenous Once Nandigam, Venia Minks, MD        PAST MEDICAL HISTORY: Past Medical History:  Diagnosis Date  . Anxiety   . Back pain   . Chest pain    CLite with apical ischemia in 2006 - normal coronary arteries by [facility] in 12/2004;  Myoview 11/12:  Low risk stress nuclear study with a small, partially reversible apical defect most likely related to apical thinning; cannot R/O very mild apical  ischemia.  EF: 75%   . Decreased hearing   . Depression   . Diabetes ([place])   . DVT (deep venous thrombosis) ([place]) 2006   hx of  . Easy bruising   . Ectopic pregnancy with intrauterine pregnancy   . Hiatal hernia   . Hx of blood clots   . Hyperlipidemia   . Joint pain   . Low back pain   . Muscle pain   . PE (pulmonary embolism) 2006   hx of  . Pre-diabetes   . Right shoulder pain   . Shortness of breath   . Sinus congestion   . Sleep apnea    CPAP   . Sprained ankle   . Swelling of ankle   . Swelling of extremity   . UTI (lower urinary tract infection)    hx    PAST SURGICAL HISTORY: Past Surgical History:  Procedure Laterality Date  . ABDOMINAL EXPLORATION SURGERY    . BUNIONECTOMY     right  . COLONOSCOPY  2009  . ECTOPIC PREGNANCY SURGERY    . FOOT SURGERY    . TUBAL LIGATION      SOCIAL HISTORY: Social History   Tobacco Use  . Smoking status: Former Smoker    Packs/day: 1.00    Years: 50.00    Pack years: 50.00    Types: Cigarettes    Last attempt to quit: 11/21/2009    Years since quitting: 8.2  . Smokeless tobacco: Never Used  Substance Use Topics  . Alcohol use: Yes    Comment: rare 3 times a year per pt  . Drug use: No    FAMILY HISTORY: Family History  Problem Relation Age of Onset  . Heart failure Mother        died from  . Colon polyps Mother   . Hypertension Mother   . Sudden death Mother   . Obesity Mother   . Alcoholism Father   . Diabetes Maternal Museum/gallery exhibitions officer  . Diabetes Maternal Aunt   . Colon cancer Neg Hx   . Esophageal cancer Neg Hx   . Stomach cancer Neg Hx     ROS: Review of Systems  Constitutional: Negative for malaise/fatigue and weight loss.  Respiratory: Positive for shortness of breath.   Cardiovascular: Negative for chest pain.       Negative leg pain Negative leg numbness  Gastrointestinal: Negative for nausea and vomiting.  Musculoskeletal: Positive for back pain (lower).       Negative  muscle weakness  Neurological: Negative for tingling and weakness.    PHYSICAL EXAM: Blood pressure Marland Kitchen)  119/59, pulse 83, temperature 97.6 F (36.4 C), temperature source Oral, height '5\' 2"'$  (1.575 m), weight 181 lb (82.1 kg), SpO2 94 %. Body mass index is 33.11 kg/m. Physical Exam Vitals signs reviewed.  Constitutional:      Appearance: Normal appearance. She is obese.  Cardiovascular:     Rate and Rhythm: Normal rate.     Pulses: Normal pulses.  Pulmonary:     Effort: Pulmonary effort is normal.  Musculoskeletal: Normal range of motion.  Skin:    General: Skin is warm and dry.  Neurological:     Mental Status: She is alert and oriented to person, place, and time.  Psychiatric:        Mood and Affect: Mood normal.        Behavior: Behavior normal.     RECENT LABS AND TESTS: BMET    Component Value Date/Time   NA 141 01/28/2018 0924   K 4.3 01/28/2018 0924   CL 97 01/28/2018 0924   CO2 24 01/28/2018 0924   GLUCOSE 116 (H) 01/28/2018 0924   GLUCOSE 98 10/24/2017 1013   BUN 21 01/28/2018 0924   CREATININE 1.09 (H) 01/28/2018 0924   CREATININE 0.82 10/04/2015 0845   CALCIUM 10.2 01/28/2018 0924   GFRNONAA 49 (L) 01/28/2018 0924   GFRNONAA 71 10/04/2015 0845   GFRAA 57 (L) 01/28/2018 0924   GFRAA 82 10/04/2015 0845   Lab Results  Component Value Date   HGBA1C 6.5 (H) 01/28/2018   HGBA1C 6.7 (H) 10/24/2017   HGBA1C 6.8 (H) 07/11/2017   HGBA1C 6.8 (H) 04/08/2017   HGBA1C 6.7 12/28/2016   Lab Results  Component Value Date   INSULIN 19.2 01/28/2018   INSULIN 13.7 05/27/2017   CBC    Component Value Date/Time   WBC 4.9 10/24/2017 1013   RBC 4.76 10/24/2017 1013   HGB 15.0 10/24/2017 1013   HGB 14.4 05/27/2017 1224   HCT 44.2 10/24/2017 1013   HCT 42.2 05/27/2017 1224   PLT 236.0 10/24/2017 1013   PLT 221 05/27/2017 1224   MCV 92.7 10/24/2017 1013   MCV 90 05/27/2017 1224   MCH 30.6 05/27/2017 1224   MCH 31.5 01/01/2011 0744   MCHC 33.9 10/24/2017 1013    RDW 13.4 10/24/2017 1013   RDW 13.7 05/27/2017 1224   LYMPHSABS 2.4 10/24/2017 1013   LYMPHSABS 2.1 05/27/2017 1224   MONOABS 0.5 10/24/2017 1013   EOSABS 0.2 10/24/2017 1013   EOSABS 0.2 05/27/2017 1224   BASOSABS 0.0 10/24/2017 1013   BASOSABS 0.0 05/27/2017 1224   Iron/TIBC/Ferritin/ %Sat No results found for: IRON, TIBC, FERRITIN, IRONPCTSAT Lipid Panel     Component Value Date/Time   CHOL 198 11/18/2017 0847   TRIG 57 11/18/2017 0847   HDL 113 11/18/2017 0847   CHOLHDL 1.8 11/18/2017 0847   CHOLHDL 2 10/24/2017 1013   VLDL 14.4 10/24/2017 1013   LDLCALC 74 11/18/2017 0847   LDLDIRECT 95.9 10/06/2012 1248   Hepatic Function Panel     Component Value Date/Time   PROT 7.4 01/28/2018 0924   ALBUMIN 5.0 (H) 01/28/2018 0924   AST 29 01/28/2018 0924   ALT 27 01/28/2018 0924   ALKPHOS 106 01/28/2018 0924   BILITOT 1.5 (H) 01/28/2018 0924   BILIDIR 0.25 05/14/2017 1007   IBILI 0.9 11/15/2015 0831      Component Value Date/Time   TSH 1.250 05/27/2017 1224   TSH 1.27 12/28/2016   TSH 1.00 01/03/2015   TSH 1.61 08/07/2007 0938   TSH  1.08 01/10/2007 0000    ASSESSMENT AND PLAN: CKD (chronic kidney disease), stage III (HCC)  Vitamin D deficiency - Plan: Vitamin D, Ergocalciferol, (DRISDOL) 1.25 MG (50000 UT) CAPS capsule  Chronic diastolic heart failure (HCC)  Low back pain without sciatica, unspecified back pain laterality, unspecified chronicity  Class 1 obesity with serious comorbidity and body mass index (BMI) of 33.0 to 33.9 in adult, unspecified obesity type  PLAN:  Chronic Kidney Disease, Stage 3 Margree was advised to avoid NSAIDs and stay well hydrated. We will move up appointment to see Dr. Percival Spanish. Audrena agrees to follow up with our clinic in 2 weeks.  Vitamin D Deficiency Jamaira was informed that low vitamin D levels contributes to fatigue and are associated with obesity, breast, and colon cancer. Zaylah agrees to continue taking prescription Vit D  '@50'$ ,000 IU every week #4 with no refills, and she will continue OTC Vit D. She will follow up for routine testing of vitamin D, at least 2-3 times per year. She was informed of the risk of over-replacement of vitamin D and agrees to not increase her dose unless she discusses this with [name] first. Yesennia agrees to follow up with our clinic in 2 weeks.  Chronic Diastolic Heart Failure Ja will move up her appointment to see Dr. Percival Spanish to January, instead of April. Advised her to discuss recent increasing shortness of breathe and changes in kidney function.   Lower Back Pain Sierra Ford was advised to see her primary care physician, Dr. Etter Sjogren. We discussed that PT may be needed.   Obesity Sierra Ford is currently in the action stage of change. As such, her goal is to continue with weight loss efforts She has agreed to portion control better and make smarter food choices, such as increase vegetables and decrease simple carbohydrates  Sierra Ford has not been prescribed exercise at this time. We discussed the following Behavioral Modification Strategies today: increasing lean protein intake and planning for success   Sierra Ford has agreed to follow up with our clinic in 2 weeks. She was informed of the importance of frequent follow up visits to maximize her success with intensive lifestyle modifications for her multiple health conditions.   OBESITY BEHAVIORAL INTERVENTION VISIT  Today's visit was # 13  Starting weight: 193 lbs Starting date: 05/27/17 Today's weight : 181 lbs  Today's date: 02/17/2018 Total lbs lost to date: 12 At least 15 minutes were spent on discussing the following behavioral intervention visit.   ASK: We discussed the diagnosis of obesity with Sierra Ford today and Sierra Ford agreed to give [name] permission to discuss obesity behavioral modification therapy today.  ASSESS: Sierra Ford has the diagnosis of obesity and her BMI today is 33.1 Sierra Ford is in the action stage of change   ADVISE: Sierra Ford  was educated on the multiple health risks of obesity as well as the benefit of weight loss to improve her health. She was advised of the need for long term treatment and the importance of lifestyle modifications to improve her current health and to decrease her risk of future health problems.  AGREE: Multiple dietary modification options and treatment options were discussed and  Sierra Ford agreed to follow the recommendations documented in the above note.  ARRANGE: Honi was educated on the importance of frequent visits to treat obesity as outlined per CMS and USPSTF guidelines and agreed to schedule her next follow up appointment today.  Sierra Ford, am acting as Location manager for [employer], FNP-C.  I have reviewed the above  documentation for accuracy and completeness, and I agree with the above.  - Judith Demps, FNP-C.

## 2018-02-27 ENCOUNTER — Encounter (INDEPENDENT_AMBULATORY_CARE_PROVIDER_SITE_OTHER): Payer: Self-pay | Admitting: Family Medicine

## 2018-02-27 ENCOUNTER — Ambulatory Visit (INDEPENDENT_AMBULATORY_CARE_PROVIDER_SITE_OTHER): Payer: Medicare Other | Admitting: Family Medicine

## 2018-02-27 DIAGNOSIS — M48062 Spinal stenosis, lumbar region with neurogenic claudication: Secondary | ICD-10-CM | POA: Diagnosis not present

## 2018-02-27 MED ORDER — TIZANIDINE HCL 2 MG PO TABS: 2.0000 mg | ORAL_TABLET | Freq: Every evening | ORAL | 1 refills | Status: DC | PRN

## 2018-02-27 MED ORDER — ETODOLAC 400 MG PO TABS: 400.0000 mg | ORAL_TABLET | Freq: Two times a day (BID) | ORAL | 3 refills | Status: DC | PRN

## 2018-02-27 NOTE — Progress Notes (Signed)
Office Visit Note   Patient: Sierra Ford           Date of Birth: February 06, 1941           MRN: [id number] Visit Date: 02/27/2018 Requested by: [address] [place], [place] [address] [place], [place] [address] PCP: Carollee Herter, Alferd Apa, DO  Subjective: Chief Complaint  Patient presents with  . Lower Back - Pain    Pain mainly right lower side of back. Last night experienced tingling down lateral left leg to mid lower leg. S/p MRI 05/2017. NKI    HPI: He is a 78 year old with low back pain.  She has had intermittent pain in her back for the past year or more.  She had an MRI scan showing L3-4 grade 1 anterolisthesis with central stenosis.  She has not had much in the way of sciatica pain, but lately she is having right-sided posterior hip/low back pain especially when bending and standing up, and occasional tingling into her left leg.  No bowel or bladder dysfunction.  She does not take medication for her pain.  She stays very active at the gym exercising, this is making it difficult to do so.              ROS: She has hypertension and diabetes.  Other systems negative as pertains to her current complaint.  Objective: Vital Signs: There were no vitals taken for this visit.  Physical Exam:  Low back: Tender near the right sacroiliac joint and in the gluteus medius area.  No significant midline spine tenderness.  Negative straight leg raise, lower extremity strength and reflexes are normal.  No pain with internal hip rotation.  Imaging: None today.  Assessment & Plan: 1.  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  Medications given as needed.  Consider epidural injection if symptoms worsen.   Follow-Up Instructions: No follow-ups on file.      Procedures: No procedures performed  No notes on file    PMFS History: Patient Active Problem List   Diagnosis Date Noted  . Right ankle  swelling 05/13/2017  . DM (diabetes mellitus) type II uncontrolled, periph vascular disorder ([place]) 04/08/2017  . Essential hypertension 04/08/2017  . Atherosclerosis of aorta ([place]) 04/08/2017  . Abnormal auditory perception of both ears 10/01/2016  . Bilateral impacted cerumen 10/01/2016  . OSA (obstructive sleep apnea) 12/16/2015  . DOE (dyspnea on exertion) 06/12/2013  . Obesity (BMI 30-39.9) 04/24/2013  . Chest pain, unspecified 01/22/2011  . Abnormal stress test 01/22/2011  . Frequent urination 01/22/2011  . Chronic diastolic heart failure ([place]) 05/15/2009  . 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  . RASH-NONVESICULAR 04/20/2009  . CERVICAL STRAIN, WITH RADICULOPATHY 03/31/2009  . CAROTID ARTERY DISEASE 01/12/2009  . CERVICAL STRAIN 01/11/2009  . LEG EDEMA, RIGHT 07/27/2008  . HX, URINARY INFECTION 09/19/2006  . Hyperlipidemia LDL goal <70 08/20/2006  . PREGNANCY, ECTOPIC NEC W/INTRAUTERINE PRG 08/20/2006  . FREQUENCY, URINARY 08/20/2006  . Other specified abnormal findings of blood chemistry 08/20/2006  . Personal history of venous thrombosis and embolism 08/20/2006   Past Medical History:  Diagnosis Date  . Anxiety   . Back pain   . Chest pain    CLite with apical ischemia in 2006 - normal coronary arteries by [facility] in 12/2004;  Myoview 11/12:  Low risk stress nuclear study with a small, partially reversible apical defect most likely related to apical thinning; cannot  R/O very mild apical ischemia.  EF: 75%   . Decreased hearing   . Depression   . Diabetes ([place])   . DVT (deep venous thrombosis) ([place]) 2006   hx of  . Easy bruising   . Ectopic pregnancy with intrauterine pregnancy   . Hiatal hernia   . Hx of blood clots   . Hyperlipidemia   . Joint pain   . Low back pain   . Muscle pain   . PE (pulmonary embolism) 2006   hx of  . Pre-diabetes   . Right shoulder pain   . Shortness of breath   . Sinus congestion   . Sleep apnea    CPAP   . Sprained ankle     . Swelling of ankle   . Swelling of extremity   . UTI (lower urinary tract infection)    hx    Family History  Problem Relation Age of Onset  . Heart failure Mother        died from  . Colon polyps Mother   . Hypertension Mother   . Sudden death Mother   . Obesity Mother   . Alcoholism Father   . Diabetes Maternal Museum/gallery exhibitions officer  . Diabetes Maternal Aunt   . Colon cancer Neg Hx   . Esophageal cancer Neg Hx   . Stomach cancer Neg Hx     Past Surgical History:  Procedure Laterality Date  . ABDOMINAL EXPLORATION SURGERY    . BUNIONECTOMY     right  . COLONOSCOPY  2009  . ECTOPIC PREGNANCY SURGERY    . FOOT SURGERY    . TUBAL LIGATION     Social History   Occupational History  . Occupation: Retired Scientist, research (medical)  Tobacco Use  . Smoking status: Former Smoker    Packs/day: 1.00    Years: 50.00    Pack years: 50.00    Types: Cigarettes    Last attempt to quit: 11/21/2009    Years since quitting: 8.2  . Smokeless tobacco: Never Used  Substance and Sexual Activity  . Alcohol use: Yes    Comment: rare 3 times a year per pt  . Drug use: No  . Sexual activity: Not Currently    Partners: Male

## 2018-03-02 NOTE — Progress Notes (Signed)
HPI The patient presents for evaluation of dyspnea.  She has had some mild diastolic dysfunction and abnormal echo.  She is referred back by her weight loss clinic.  She is lost about 20 pounds total that she gained some back over the holidays.  They noted that she was having increasing shortness of breath.  She says this has been getting worse over 3 or 4 months.  She gets short of breath dragging garbage cans out.  She gets short of breath walking a short distance on level ground.  She is not describing PND or orthopnea.  She is not having any chest pressure, neck or arm discomfort.  She has no palpitations, presyncope or syncope.  She thinks it is different than her previous bronchitis.  I have evaluated her in the past with an echocardiogram that demonstrated only grade 1 diastolic dysfunction and normal systolic function.  She had a treadmill test in 2015.  She is had no cough fevers or chills.   No Known Allergies  Current Outpatient Medications  Medication Sig Dispense Refill  . aspirin EC 81 MG tablet Take 81 mg by mouth at bedtime.      . blood glucose meter kit and supplies KIT Dispense based on patient and insurance preference. Use up to four times daily as directed. (FOR ICD-9 250.00, 250.01). 1 each 0  . CALCIUM-VITAMIN D PO Take 600 Units by mouth 2 (two) times daily.      . cholecalciferol (VITAMIN D-400) 400 UNITS TABS Take 400 Units by mouth 2 (two) times daily.     . Coenzyme Q10 100 MG TABS Take 100 mg by mouth daily.    Marland Kitchen etodolac (LODINE) 400 MG tablet Take 1 tablet (400 mg total) by mouth 2 (two) times daily as needed. 60 tablet 3  . ezetimibe (ZETIA) 10 MG tablet TAKE 1 TABLET BY MOUTH ONCE DAILY 90 tablet 2  . Flaxseed, Linseed, (FLAXSEED OIL PO) Take by mouth.    . furosemide (LASIX) 40 MG tablet Take 1 tablet (40 mg total) by mouth daily. 90 tablet 1  . metFORMIN (GLUCOPHAGE-XR) 500 MG 24 hr tablet TAKE 1 TABLET BY MOUTH ONCE DAILY WITH BREAKFAST 90 tablet 1  .  Multiple Vitamin (MULTIVITAMIN PO) Take 1 tablet by mouth at bedtime.     . Omega-3 Fatty Acids (FISH OIL PO) Take by mouth.    Marland Kitchen POTASSIUM CHLORIDE PO Take 1 tablet by mouth as needed.    . rosuvastatin (CRESTOR) 10 MG tablet Take 10 mg by mouth daily.    Marland Kitchen tiZANidine (ZANAFLEX) 2 MG tablet Take 1-2 tablets (2-4 mg total) by mouth at bedtime as needed for muscle spasms. 60 tablet 1  . triamcinolone cream (KENALOG) 0.1 % Apply 1 application topically 2 (two) times daily. 45 g 2  . TRUE METRIX BLOOD GLUCOSE TEST test strip TEST  UP  TO FOUR TIMES DAILY AS DIRECTED 200 each 12  . TRUEPLUS LANCETS 33G MISC TEST  UP TO FOUR TIMES DAILY AS DIRECTED 200 each 12  . Vitamin D, Ergocalciferol, (DRISDOL) 1.25 MG (50000 UT) CAPS capsule Take 1 capsule (50,000 Units total) by mouth every 7 (seven) days. 4 capsule 0   Current Facility-Administered Medications  Medication Dose Route Frequency Provider Last Rate Last Dose  . 0.9 %  sodium chloride infusion  500 mL Intravenous Once Mauri Pole, MD        Past Medical History:  Diagnosis Date  . Anxiety   .  Back pain   . Chest pain    CLite with apical ischemia in 2006 - normal coronary arteries by [facility] in 12/2004;  Myoview 11/12:  Low risk stress nuclear study with a small, partially reversible apical defect most likely related to apical thinning; cannot R/O very mild apical ischemia.  EF: 75%   . Decreased hearing   . Depression   . Diabetes ([place])   . DVT (deep venous thrombosis) ([place]) 2006   hx of  . Ectopic pregnancy with intrauterine pregnancy   . Hiatal hernia   . Hyperlipidemia   . PE (pulmonary embolism) 2006   hx of  . Sinus congestion   . Sleep apnea    CPAP     Past Surgical History:  Procedure Laterality Date  . ABDOMINAL EXPLORATION SURGERY    . BUNIONECTOMY     right  . COLONOSCOPY  2009  . ECTOPIC PREGNANCY SURGERY    . FOOT SURGERY    . TUBAL LIGATION      ROS:  As stated in the HPI and negative for all other  systems.  PHYSICAL EXAM BP 124/64   Pulse 83   Ht '5\' 2"'  (1.575 m)   Wt 188 lb (85.3 kg)   BMI 34.39 kg/m   GENERAL:  Well appearing NECK:  No jugular venous distention, waveform within normal limits, carotid upstroke brisk and symmetric, no bruits, no thyromegaly LUNGS:  Clear to auscultation bilaterally CHEST:  Unremarkable HEART:  PMI not displaced or sustained,S1 and S2 within normal limits, no S3, no S4, no clicks, no rubs, no  murmurs ABD:  Flat, positive bowel sounds normal in frequency in pitch, no bruits, no rebound, no guarding, no midline pulsatile mass, no hepatomegaly, no splenomegaly EXT:  2 plus pulses throughout, non pitting right leg edema, no cyanosis no clubbing     EKG:  Sinus rhythm, rate 83 left axis deviation, poor anterior R wave progression, minimal voltage criteria for left ventricular hypertrophy, no acute ST-T wave changes.  03/04/2018    Lab Results  Component Value Date   HGBA1C 6.5 (H) 01/28/2018    ASSESSMENT AND PLAN  DIASTOLIC HF:   She has a small component of this in the past.  It is hard to imagine that this is causing the symptoms that she describes.  I will check a BNP level.  If this is negative I plan to check a cardiopulmonary stress test.  Of note she had slightly abnormal pulmonary function testing in 2015 I went back and saw a note from Dr. Lamonte Sakai.  He had suggested potentially ordering this study back then if she had increasing shortness of breath.   EDEMA:    This is been mild.  No change in therapy.  DM:   A1c was 6.5.   She is now taking metformin.  SLEEP APNEA:   She is wearing CPAP.  No change in therapy  DYSLIPIDEMIA:  She is being followed in the Lipid Clinic.

## 2018-03-03 ENCOUNTER — Encounter (INDEPENDENT_AMBULATORY_CARE_PROVIDER_SITE_OTHER): Payer: Self-pay

## 2018-03-03 DIAGNOSIS — M545 Low back pain: Secondary | ICD-10-CM | POA: Diagnosis not present

## 2018-03-03 DIAGNOSIS — M48061 Spinal stenosis, lumbar region without neurogenic claudication: Secondary | ICD-10-CM | POA: Diagnosis not present

## 2018-03-04 ENCOUNTER — Ambulatory Visit (INDEPENDENT_AMBULATORY_CARE_PROVIDER_SITE_OTHER): Payer: Medicare Other | Admitting: Cardiology

## 2018-03-04 ENCOUNTER — Encounter (INDEPENDENT_AMBULATORY_CARE_PROVIDER_SITE_OTHER): Payer: Self-pay

## 2018-03-04 ENCOUNTER — Ambulatory Visit (INDEPENDENT_AMBULATORY_CARE_PROVIDER_SITE_OTHER): Payer: Medicare PPO | Admitting: Family Medicine

## 2018-03-04 ENCOUNTER — Encounter: Payer: Self-pay | Admitting: Cardiology

## 2018-03-04 VITALS — BP 124/64 | HR 83 | Ht 62.0 in | Wt 188.0 lb

## 2018-03-04 DIAGNOSIS — I5032 Chronic diastolic (congestive) heart failure: Secondary | ICD-10-CM | POA: Diagnosis not present

## 2018-03-04 DIAGNOSIS — R0602 Shortness of breath: Secondary | ICD-10-CM | POA: Diagnosis not present

## 2018-03-04 DIAGNOSIS — E785 Hyperlipidemia, unspecified: Secondary | ICD-10-CM

## 2018-03-04 NOTE — Patient Instructions (Signed)
Medication Instructions:  Continue current medications  If you need a refill on your cardiac medications before your next appointment, please call your pharmacy.  Labwork: BNP Today HERE IN OUR OFFICE AT [facility]    You will NOT need to fast   Take the provided lab slips with you to the lab for your blood draw.  When you have your labs (blood work) drawn today and your tests are completely normal, you will receive your results only by MyChart Message (if you have MyChart) -OR-  A paper copy in the mail.  If you have any lab test that is abnormal or we need to change your treatment, we will call you to review these results.  Testing/Procedures: None ordered  Follow-Up: You will need a follow up appointment in 4 months.  Please call our office 2 months in advance to schedule this appointment.  You may see Dr Percival Spanish or one of the following Advanced Practice Providers on your designated Care Team:   Rosaria Ferries, PA-C . Jory Sims, DNP, ANP    At [facility], you and your health needs are our priority.  As part of our continuing mission to provide you with exceptional heart care, we have created designated Provider Care Teams.  These Care Teams include your primary Cardiologist (physician) and Advanced Practice Providers (APPs -  Physician Assistants and Nurse Practitioners) who all work together to provide you with the care you need, when you need it.  Thank you for choosing [facility] at [facility]!!

## 2018-03-05 DIAGNOSIS — M48061 Spinal stenosis, lumbar region without neurogenic claudication: Secondary | ICD-10-CM | POA: Diagnosis not present

## 2018-03-05 DIAGNOSIS — M545 Low back pain: Secondary | ICD-10-CM | POA: Diagnosis not present

## 2018-03-05 DIAGNOSIS — G4733 Obstructive sleep apnea (adult) (pediatric): Secondary | ICD-10-CM | POA: Diagnosis not present

## 2018-03-05 LAB — BRAIN NATRIURETIC PEPTIDE: BNP: 37.6 pg/mL (ref 0.0–100.0)

## 2018-03-11 DIAGNOSIS — M545 Low back pain: Secondary | ICD-10-CM | POA: Diagnosis not present

## 2018-03-11 DIAGNOSIS — M48061 Spinal stenosis, lumbar region without neurogenic claudication: Secondary | ICD-10-CM | POA: Diagnosis not present

## 2018-03-13 DIAGNOSIS — M48061 Spinal stenosis, lumbar region without neurogenic claudication: Secondary | ICD-10-CM | POA: Diagnosis not present

## 2018-03-13 DIAGNOSIS — M545 Low back pain: Secondary | ICD-10-CM | POA: Diagnosis not present

## 2018-03-17 DIAGNOSIS — M545 Low back pain: Secondary | ICD-10-CM | POA: Diagnosis not present

## 2018-03-17 DIAGNOSIS — M48061 Spinal stenosis, lumbar region without neurogenic claudication: Secondary | ICD-10-CM | POA: Diagnosis not present

## 2018-03-19 DIAGNOSIS — M545 Low back pain: Secondary | ICD-10-CM | POA: Diagnosis not present

## 2018-03-19 DIAGNOSIS — M48061 Spinal stenosis, lumbar region without neurogenic claudication: Secondary | ICD-10-CM | POA: Diagnosis not present

## 2018-03-25 DIAGNOSIS — M48061 Spinal stenosis, lumbar region without neurogenic claudication: Secondary | ICD-10-CM | POA: Diagnosis not present

## 2018-03-25 DIAGNOSIS — M545 Low back pain: Secondary | ICD-10-CM | POA: Diagnosis not present

## 2018-03-28 DIAGNOSIS — M545 Low back pain: Secondary | ICD-10-CM | POA: Diagnosis not present

## 2018-03-28 DIAGNOSIS — M48061 Spinal stenosis, lumbar region without neurogenic claudication: Secondary | ICD-10-CM | POA: Diagnosis not present

## 2018-04-09 ENCOUNTER — Telehealth (INDEPENDENT_AMBULATORY_CARE_PROVIDER_SITE_OTHER): Payer: Self-pay | Admitting: Family Medicine

## 2018-04-09 NOTE — Telephone Encounter (Signed)
Patient called advised will complete a medical records release form

## 2018-04-14 DIAGNOSIS — Z1231 Encounter for screening mammogram for malignant neoplasm of breast: Secondary | ICD-10-CM | POA: Diagnosis not present

## 2018-04-18 ENCOUNTER — Encounter (INDEPENDENT_AMBULATORY_CARE_PROVIDER_SITE_OTHER): Payer: Self-pay | Admitting: Family Medicine

## 2018-04-18 ENCOUNTER — Ambulatory Visit (INDEPENDENT_AMBULATORY_CARE_PROVIDER_SITE_OTHER): Payer: Medicare Other | Admitting: Family Medicine

## 2018-04-18 DIAGNOSIS — M533 Sacrococcygeal disorders, not elsewhere classified: Secondary | ICD-10-CM

## 2018-04-18 MED ORDER — METHYLPREDNISOLONE ACETATE 40 MG/ML IJ SUSP: 40.0000 mg | Freq: Once | INTRAMUSCULAR | Status: DC

## 2018-04-18 NOTE — Progress Notes (Signed)
Office Visit Note   Patient: Sierra Ford           Date of Birth: 1940/05/24           MRN: [id number] Visit Date: 04/18/2018 Requested by: [address] [place], [address] [place], [place] [address] PCP: Carollee Herter, Alferd Apa, DO  Subjective: Chief Complaint  Patient presents with  . Lower Back - Pain    Had no improvement with the PT. Tried a Restaurant manager, fast food - no help. Right lower back pain. Does not radiate down the leg.    HPI: She is here with persistent right posterior hip pain.  Physical therapy unfortunately has not helped.  Pain when she bends down to pick something up, while twisting to the right.  Pain when standing for a long time as well.  No radicular symptoms.              ROS: Noncontributory  Objective: Vital Signs: There were no vitals taken for this visit.  Physical Exam:  Low back: She remains tender near the right sacroiliac joint.  Straight leg raise negative, lower extremity strength and reflexes are normal.  Imaging: None today.  Assessment & Plan: 1.  Right posterior hip pain, exam suggests sacroiliac dysfunction.  She has underlying L3-4 stenosis. -Discussed options with patient and she wants to try an injection into the SI joint.  We will do this under ultrasound guidance.  Follow-up as needed.     Procedures: Ultrasound-guided right SI joint injection: After sterile prep with Betadine, injected 5 cc 1% lidocaine without epinephrine and 40 mg methylprednisolone using a 22-gauge spinal needle, passing the needle from medial to lateral into the right SI joint.  She had excellent pain relief during the immediate anesthetic phase.  Follow-up as needed.    PMFS History: Patient Active Problem List   Diagnosis Date Noted  . Right ankle swelling 05/13/2017  . DM (diabetes mellitus) type II uncontrolled, periph vascular disorder ([place]) 04/08/2017  . Essential hypertension 04/08/2017  . Atherosclerosis of aorta ([place]) 04/08/2017    . Abnormal auditory perception of both ears 10/01/2016  . Bilateral impacted cerumen 10/01/2016  . OSA (obstructive sleep apnea) 12/16/2015  . DOE (dyspnea on exertion) 06/12/2013  . Obesity (BMI 30-39.9) 04/24/2013  . Chest pain, unspecified 01/22/2011  . Abnormal stress test 01/22/2011  . Frequent urination 01/22/2011  . Chronic diastolic heart failure ([place]) 05/15/2009  . 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  . RASH-NONVESICULAR 04/20/2009  . CERVICAL STRAIN, WITH RADICULOPATHY 03/31/2009  . CAROTID ARTERY DISEASE 01/12/2009  . CERVICAL STRAIN 01/11/2009  . LEG EDEMA, RIGHT 07/27/2008  . HX, URINARY INFECTION 09/19/2006  . Hyperlipidemia LDL goal <70 08/20/2006  . PREGNANCY, ECTOPIC NEC W/INTRAUTERINE PRG 08/20/2006  . FREQUENCY, URINARY 08/20/2006  . Other specified abnormal findings of blood chemistry 08/20/2006  . Personal history of venous thrombosis and embolism 08/20/2006   Past Medical History:  Diagnosis Date  . Anxiety   . Back pain   . Chest pain    CLite with apical ischemia in 2006 - normal coronary arteries by [facility] in 12/2004;  Myoview 11/12:  Low risk stress nuclear study with a small, partially reversible apical defect most likely related to apical thinning; cannot R/O very mild apical ischemia.  EF: 75%   . Decreased hearing   . Depression   . Diabetes ([place])   . DVT (deep venous thrombosis) ([place]) 2006   hx of  . Ectopic pregnancy with  intrauterine pregnancy   . Hiatal hernia   . Hyperlipidemia   . PE (pulmonary embolism) 2006   hx of  . Sinus congestion   . Sleep apnea    CPAP     Family History  Problem Relation Age of Onset  . Heart failure Mother        died from  . Colon polyps Mother   . Hypertension Mother   . Sudden death Mother   . Obesity Mother   . Alcoholism Father   . Diabetes Maternal Museum/gallery exhibitions officer  . Diabetes Maternal Aunt   . Colon cancer Neg Hx   . Esophageal cancer Neg Hx   . Stomach cancer Neg Hx      Past Surgical History:  Procedure Laterality Date  . ABDOMINAL EXPLORATION SURGERY    . BUNIONECTOMY     right  . COLONOSCOPY  2009  . ECTOPIC PREGNANCY SURGERY    . FOOT SURGERY    . TUBAL LIGATION     Social History   Occupational History  . Occupation: Retired Scientist, research (medical)  Tobacco Use  . Smoking status: Former Smoker    Packs/day: 1.00    Years: 50.00    Pack years: 50.00    Types: Cigarettes    Last attempt to quit: 11/21/2009    Years since quitting: 8.4  . Smokeless tobacco: Never Used  Substance and Sexual Activity  . Alcohol use: Yes    Comment: rare 3 times a year per pt  . Drug use: No  . Sexual activity: Not Currently    Partners: Male

## 2018-04-21 ENCOUNTER — Telehealth (INDEPENDENT_AMBULATORY_CARE_PROVIDER_SITE_OTHER): Payer: Self-pay | Admitting: Family Medicine

## 2018-04-21 DIAGNOSIS — M533 Sacrococcygeal disorders, not elsewhere classified: Secondary | ICD-10-CM

## 2018-04-21 NOTE — Telephone Encounter (Signed)
Will refer to FN to do it under fluoro.

## 2018-04-21 NOTE — Telephone Encounter (Signed)
I called: she felt better for about 2 hours after the SI injection 04/18/2018, then the pain returned as before.  She would like to know if she could have another injection, in maybe a different area, to see if it would help her pain. Please advise.

## 2018-04-21 NOTE — Telephone Encounter (Signed)
Patient called she is currently in pain from her cortisone injection please call patient to advise

## 2018-04-22 NOTE — Telephone Encounter (Signed)
I called and advised the patient that Dr. Romona Curls assistant will be calling her to schedule this.

## 2018-04-24 ENCOUNTER — Ambulatory Visit (INDEPENDENT_AMBULATORY_CARE_PROVIDER_SITE_OTHER): Payer: Medicare Other | Admitting: Family Medicine

## 2018-04-24 ENCOUNTER — Encounter: Payer: Self-pay | Admitting: Family Medicine

## 2018-04-24 VITALS — BP 118/66 | HR 63 | Temp 98.3°F | Resp 16 | Ht 62.0 in | Wt 186.8 lb

## 2018-04-24 DIAGNOSIS — E1169 Type 2 diabetes mellitus with other specified complication: Secondary | ICD-10-CM | POA: Diagnosis not present

## 2018-04-24 DIAGNOSIS — E785 Hyperlipidemia, unspecified: Secondary | ICD-10-CM

## 2018-04-24 DIAGNOSIS — E1151 Type 2 diabetes mellitus with diabetic peripheral angiopathy without gangrene: Secondary | ICD-10-CM

## 2018-04-24 DIAGNOSIS — I10 Essential (primary) hypertension: Secondary | ICD-10-CM | POA: Diagnosis not present

## 2018-04-24 DIAGNOSIS — IMO0002 Reserved for concepts with insufficient information to code with codable children: Secondary | ICD-10-CM

## 2018-04-24 DIAGNOSIS — E1165 Type 2 diabetes mellitus with hyperglycemia: Secondary | ICD-10-CM

## 2018-04-24 LAB — COMPREHENSIVE METABOLIC PANEL
ALT: 25 U/L (ref 0–35)
AST: 20 U/L (ref 0–37)
Albumin: 4.7 g/dL (ref 3.5–5.2)
Alkaline Phosphatase: 92 U/L (ref 39–117)
BUN: 27 mg/dL — ABNORMAL HIGH (ref 6–23)
CHLORIDE: 101 meq/L (ref 96–112)
CO2: 31 mEq/L (ref 19–32)
Calcium: 10.2 mg/dL (ref 8.4–10.5)
Creatinine, Ser: 0.89 mg/dL (ref 0.40–1.20)
GFR: 74.5 mL/min (ref >60.00)
Glucose, Bld: 109 mg/dL — ABNORMAL HIGH (ref 70–99)
POTASSIUM: 4.6 meq/L (ref 3.5–5.1)
Sodium: 140 mEq/L (ref 135–145)
Total Bilirubin: 1.4 mg/dL — ABNORMAL HIGH (ref 0.2–1.2)
Total Protein: 7.4 g/dL (ref 6.0–8.3)

## 2018-04-24 LAB — LIPID PANEL
CHOL/HDL RATIO: 2
Cholesterol: 230 mg/dL — ABNORMAL HIGH (ref 0–200)
HDL: 125.9 mg/dL (ref >39.00)
LDL Cholesterol: 90 mg/dL (ref 0–99)
NonHDL: 103.69
Triglycerides: 70 mg/dL (ref 0.0–149.0)
VLDL: 14 mg/dL (ref 0.0–40.0)

## 2018-04-24 LAB — HEMOGLOBIN A1C: Hgb A1c MFr Bld: 6.7 % — ABNORMAL HIGH (ref 4.6–6.5)

## 2018-04-24 MED ORDER — ZOSTER VAC RECOMB ADJUVANTED 50 MCG/0.5ML IM SUSR: INTRAMUSCULAR | 1 refills | Status: DC

## 2018-04-24 NOTE — Addendum Note (Signed)
Addended by: Caffie Pinto on: 04/24/2018 04:45 PM   Modules accepted: Orders

## 2018-04-24 NOTE — Patient Instructions (Signed)
Carbohydrate Counting for Diabetes Mellitus, Adult  Carbohydrate counting is a method of keeping track of how many carbohydrates you eat. Eating carbohydrates naturally increases the amount of sugar (glucose) in the blood. Counting how many carbohydrates you eat helps keep your blood glucose within normal limits, which helps you manage your diabetes (diabetes mellitus). It is important to know how many carbohydrates you can safely have in each meal. This is different for every person. A diet and nutrition specialist (registered dietitian) can help you make a meal plan and calculate how many carbohydrates you should have at each meal and snack. Carbohydrates are found in the following foods:  Grains, such as breads and cereals.  Dried beans and soy products.  Starchy vegetables, such as potatoes, peas, and corn.  Fruit and fruit juices.  Milk and yogurt.  Sweets and snack foods, such as cake, cookies, candy, chips, and soft drinks. How do I count carbohydrates? There are two ways to count carbohydrates in food. You can use either of the methods or a combination of both. Reading "Nutrition Facts" on packaged food The "Nutrition Facts" list is included on the labels of almost all packaged foods and beverages in the [place] It includes:  The serving size.  Information about nutrients in each serving, including the grams (g) of carbohydrate per serving. To use the "Nutrition Facts":  Decide how many servings you will have.  Multiply the number of servings by the number of carbohydrates per serving.  The resulting number is the total amount of carbohydrates that you will be having. Learning standard serving sizes of other foods When you eat carbohydrate foods that are not packaged or do not include "Nutrition Facts" on the label, you need to measure the servings in order to count the amount of carbohydrates:  Measure the foods that you will eat with a food scale or measuring cup, if needed.   Decide how many standard-size servings you will eat.  Multiply the number of servings by 15. Most carbohydrate-rich foods have about 15 g of carbohydrates per serving. ? For example, if you eat 8 oz (170 g) of strawberries, you will have eaten 2 servings and 30 g of carbohydrates (2 servings x 15 g = 30 g).  For foods that have more than one food mixed, such as soups and casseroles, you must count the carbohydrates in each food that is included. The following list contains standard serving sizes of common carbohydrate-rich foods. Each of these servings has about 15 g of carbohydrates:   hamburger bun or  English muffin.   oz (15 mL) syrup.   oz (14 g) jelly.  1 slice of bread.  1 six-inch tortilla.  3 oz (85 g) cooked rice or pasta.  4 oz (113 g) cooked dried beans.  4 oz (113 g) starchy vegetable, such as peas, corn, or potatoes.  4 oz (113 g) hot cereal.  4 oz (113 g) mashed potatoes or  of a large baked potato.  4 oz (113 g) canned or frozen fruit.  4 oz (120 mL) fruit juice.  4-6 crackers.  6 chicken nuggets.  6 oz (170 g) unsweetened dry cereal.  6 oz (170 g) plain fat-free yogurt or yogurt sweetened with artificial sweeteners.  8 oz (240 mL) milk.  8 oz (170 g) fresh fruit or one small piece of fruit.  24 oz (680 g) popped popcorn. Example of carbohydrate counting Sample meal  3 oz (85 g) chicken breast.  6 oz (170 g)   brown rice.  4 oz (113 g) corn.  8 oz (240 mL) milk.  8 oz (170 g) strawberries with sugar-free whipped topping. Carbohydrate calculation 1. Identify the foods that contain carbohydrates: ? Rice. ? Corn. ? Milk. ? Strawberries. 2. Calculate how many servings you have of each food: ? 2 servings rice. ? 1 serving corn. ? 1 serving milk. ? 1 serving strawberries. 3. Multiply each number of servings by 15 g: ? 2 servings rice x 15 g = 30 g. ? 1 serving corn x 15 g = 15 g. ? 1 serving milk x 15 g = 15 g. ? 1 serving  strawberries x 15 g = 15 g. 4. Add together all of the amounts to find the total grams of carbohydrates eaten: ? 30 g + 15 g + 15 g + 15 g = 75 g of carbohydrates total. Summary  Carbohydrate counting is a method of keeping track of how many carbohydrates you eat.  Eating carbohydrates naturally increases the amount of sugar (glucose) in the blood.  Counting how many carbohydrates you eat helps keep your blood glucose within normal limits, which helps you manage your diabetes.  A diet and nutrition specialist (registered dietitian) can help you make a meal plan and calculate how many carbohydrates you should have at each meal and snack. This information is not intended to replace advice given to you by your health care provider. Make sure you discuss any questions you have with your health care provider. Document Released: 02/05/2005 Document Revised: 08/15/2016 Document Reviewed: 07/20/2015 Elsevier Interactive Patient Education  2019 Elsevier Inc.  

## 2018-04-24 NOTE — Assessment & Plan Note (Signed)
Tolerating statin, encouraged heart healthy diet, avoid trans fats, minimize simple carbs and saturated fats. Increase exercise as tolerated 

## 2018-04-24 NOTE — Progress Notes (Signed)
Patient ID: Sierra Ford, female    DOB: [birth year]  Age: 77 y.o. MRN: [id number]    Subjective:  Subjective  HPI  Sierra Ford presents for f/u dm, bp and lipids.    HYPERTENSION   Blood pressure range-not checking   Chest pain- no      Dyspnea- no Lightheadedness- no   Edema- no  Other side effects - no   Medication compliance: good Low salt diet- yes    DIABETES    Blood Sugar ranges-121 this afternoon  Polyuria- no New Visual problems- no  Hypoglycemic symptoms- no  Other side effects-good Medication compliance - good Last eye exam- 08/2017 Foot exam- today   HYPERLIPIDEMIA  Medication compliance- good RUQ pain- no  Muscle aches- no Other side effects-no    Review of Systems  Constitutional: Negative for chills and fever.  HENT: Negative for congestion and hearing loss.   Eyes: Negative for discharge.  Respiratory: Negative for cough and shortness of breath.   Cardiovascular: Negative for chest pain, palpitations and leg swelling.  Gastrointestinal: Negative for abdominal pain, blood in stool, constipation, diarrhea, nausea and vomiting.  Genitourinary: Negative for dysuria, frequency, hematuria and urgency.  Musculoskeletal: Negative for back pain and myalgias.  Skin: Negative for rash.  Allergic/Immunologic: Negative for environmental allergies.  Neurological: Negative for dizziness, weakness and headaches.  Hematological: Does not bruise/bleed easily.  Psychiatric/Behavioral: Negative for suicidal ideas. The patient is not nervous/anxious.     History Past Medical History:  Diagnosis Date  . Anxiety   . Back pain   . Chest pain    CLite with apical ischemia in 2006 - normal coronary arteries by [facility] in 12/2004;  Myoview 11/12:  Low risk stress nuclear study with a small, partially reversible apical defect most likely related to apical thinning; cannot R/O very mild apical ischemia.  EF: 75%   . Decreased hearing   . Depression   . Diabetes ([place])    . DVT (deep venous thrombosis) ([place]) 2006   hx of  . Ectopic pregnancy with intrauterine pregnancy   . Hiatal hernia   . Hyperlipidemia   . PE (pulmonary embolism) 2006   hx of  . Sinus congestion   . Sleep apnea    CPAP     She has a past surgical history that includes Foot surgery; Abdominal exploration surgery; Ectopic pregnancy surgery; Bunionectomy; Tubal ligation; and Colonoscopy (2009).   Her family history includes Alcoholism in her father; Colon polyps in her mother; Diabetes in her maternal aunt and maternal aunt; Heart failure in her mother; Hypertension in her mother; Obesity in her mother; Sudden death in her mother.She reports that she quit smoking about 8 years ago. Her smoking use included cigarettes. She has a 50.00 pack-year smoking history. She has never used smokeless tobacco. She reports current alcohol use. She reports that she does not use drugs.  Current Outpatient Medications on File Prior to Visit  Medication Sig Dispense Refill  . aspirin EC 81 MG tablet Take 81 mg by mouth at bedtime.      . blood glucose meter kit and supplies KIT Dispense based on patient and insurance preference. Use up to four times daily as directed. (FOR ICD-9 250.00, 250.01). 1 each 0  . CALCIUM-VITAMIN D PO Take 600 Units by mouth 2 (two) times daily.      . cholecalciferol (VITAMIN D-400) 400 UNITS TABS Take 400 Units by mouth 2 (two) times daily.     . Coenzyme Q10  100 MG TABS Take 100 mg by mouth daily.    Marland Kitchen etodolac (LODINE) 400 MG tablet Take 1 tablet (400 mg total) by mouth 2 (two) times daily as needed. 60 tablet 3  . ezetimibe (ZETIA) 10 MG tablet TAKE 1 TABLET BY MOUTH ONCE DAILY 90 tablet 2  . Flaxseed, Linseed, (FLAXSEED OIL PO) Take by mouth.    . furosemide (LASIX) 40 MG tablet Take 1 tablet (40 mg total) by mouth daily. 90 tablet 1  . metFORMIN (GLUCOPHAGE-XR) 500 MG 24 hr tablet TAKE 1 TABLET BY MOUTH ONCE DAILY WITH BREAKFAST 90 tablet 1  . Omega-3 Fatty Acids (FISH  OIL PO) Take by mouth.    Marland Kitchen POTASSIUM CHLORIDE PO Take 1 tablet by mouth as needed.    . rosuvastatin (CRESTOR) 10 MG tablet Take 10 mg by mouth daily.    Marland Kitchen tiZANidine (ZANAFLEX) 2 MG tablet Take 1-2 tablets (2-4 mg total) by mouth at bedtime as needed for muscle spasms. 60 tablet 1  . triamcinolone cream (KENALOG) 0.1 % Apply 1 application topically 2 (two) times daily. 45 g 2  . TRUE METRIX BLOOD GLUCOSE TEST test strip TEST  UP  TO FOUR TIMES DAILY AS DIRECTED 200 each 12  . TRUEPLUS LANCETS 33G MISC TEST  UP TO FOUR TIMES DAILY AS DIRECTED 200 each 12  . Vitamin D, Ergocalciferol, (DRISDOL) 1.25 MG (50000 UT) CAPS capsule Take 1 capsule (50,000 Units total) by mouth every 7 (seven) days. 4 capsule 0  . [DISCONTINUED] omega-3 acid ethyl esters (LOVAZA) 1 G capsule Take 1 g by mouth 3 (three) times daily.      . [DISCONTINUED] pravastatin (PRAVACHOL) 20 MG tablet Take 20 mg by mouth daily.       Current Facility-Administered Medications on File Prior to Visit  Medication Dose Route Frequency Provider Last Rate Last Dose  . 0.9 %  sodium chloride infusion  500 mL Intravenous Once Nandigam, Kavitha V, MD      . methylPREDNISolone acetate (DEPO-MEDROL) injection 40 mg  40 mg Intra-articular Once Hilts, Michael, MD         Objective:  Objective  Physical Exam Vitals signs and nursing note reviewed.  Constitutional:      Appearance: She is well-developed.  HENT:     Head: Normocephalic and atraumatic.  Eyes:     Conjunctiva/sclera: Conjunctivae normal.  Neck:     Musculoskeletal: Normal range of motion and neck supple.     Thyroid: No thyromegaly.     Vascular: No carotid bruit or JVD.  Cardiovascular:     Rate and Rhythm: Normal rate and regular rhythm.     Heart sounds: Normal heart sounds. No murmur.  Pulmonary:     Effort: Pulmonary effort is normal. No respiratory distress.     Breath sounds: Normal breath sounds. No wheezing or rales.  Chest:     Chest wall: No tenderness.    Neurological:     Mental Status: She is alert and oriented to person, place, and time.     Diabetic Foot Exam - Simple   No data filed      BP 118/66 (BP Location: Right Arm, Cuff Size: Large)   Pulse 63   Temp 98.3 F (36.8 C) (Oral)   Resp 16   Ht _0  (1.575 m)   Wt 186 lb 12.8 oz (84.7 kg)   SpO2 98%   BMI 34.17 kg/m  Wt Readings from Last 3 Encounters:  04/24/18 186 lb 12.8  oz (84.7 kg)  03/04/18 188 lb (85.3 kg)  02/17/18 181 lb (82.1 kg)     Lab Results  Component Value Date   WBC 4.9 10/24/2017   HGB 15.0 10/24/2017   HCT 44.2 10/24/2017   PLT 236.0 10/24/2017   GLUCOSE 116 (H) 01/28/2018   CHOL 198 11/18/2017   TRIG 57 11/18/2017   HDL 113 11/18/2017   LDLDIRECT 95.9 10/06/2012   LDLCALC 74 11/18/2017   ALT 27 01/28/2018   AST 29 01/28/2018   NA 141 01/28/2018   K 4.3 01/28/2018   CL 97 01/28/2018   CREATININE 1.09 (H) 01/28/2018   BUN 21 01/28/2018   CO2 24 01/28/2018   TSH 1.250 05/27/2017   INR 1.03 04/20/2009   HGBA1C 6.5 (H) 01/28/2018   MICROALBUR 1.1 04/08/2017    Dg Chest 2 View  Result Date: 01/29/2018 CLINICAL DATA:  Cough. EXAM: CHEST - 2 VIEW COMPARISON:  04/08/2017 FINDINGS: The heart size and mediastinal contours are within normal limits. Both lungs are clear. a stable right upper lobe scar. The visualized skeletal structures are unremarkable. IMPRESSION: No active cardiopulmonary disease. Electronically Signed   By: Kerby Moors M.D.   On: 01/29/2018 17:33     Assessment & Plan:  Plan  I have discontinued Shavonta B. Bernards's Multiple Vitamin (MULTIVITAMIN PO). I am also having her start on Zoster Vaccine Adjuvanted. Additionally, I am having her maintain her cholecalciferol, CALCIUM-VITAMIN D PO, aspirin EC, Coenzyme Q10, (Flaxseed, Linseed, (FLAXSEED OIL PO)), Omega-3 Fatty Acids (FISH OIL PO), blood glucose meter kit and supplies, rosuvastatin, POTASSIUM CHLORIDE PO, furosemide, ezetimibe, TRUE METRIX BLOOD GLUCOSE TEST,  TRUEPLUS LANCETS 33G, triamcinolone cream, metFORMIN, Vitamin D (Ergocalciferol), etodolac, and tiZANidine. We will continue to administer sodium chloride and methylPREDNISolone acetate.  Meds ordered this encounter  Medications  . Zoster Vaccine Adjuvanted [facility]) injection    Sig: 0.72m IM x1  Repeat in 2-6 months    Dispense:  0.5 mL    Refill:  1    Problem List Items Addressed This Visit      Unprioritized   DM (diabetes mellitus) type II uncontrolled, periph vascular disorder ([place]    hgba1c to be checked, minimize simple carbs. Increase exercise as tolerated. Continue current meds       Relevant Orders   Hemoglobin A1c   Comprehensive metabolic panel   Essential hypertension    Well controlled, no changes to meds. Encouraged heart healthy diet such as the DASH diet and exercise as tolerated.       Relevant Orders   Lipid panel   Hemoglobin A1c   Comprehensive metabolic panel   Hyperlipidemia associated with type 2 diabetes mellitus ([place]    Tolerating statin, encouraged heart healthy diet, avoid trans fats, minimize simple carbs and saturated fats. Increase exercise as tolerated      Relevant Orders   Lipid panel   Comprehensive metabolic panel   Hyperlipidemia LDL goal <70     Tolerating statin, encouraged heart healthy diet, avoid trans fats, minimize simple carbs and saturated fats. Increase exercise as tolerated         Follow-up: Return in about 6 months (around 10/25/2018), or if symptoms worsen or fail to improve, for annual exam, fasting.  YAnn Held DO

## 2018-04-24 NOTE — Assessment & Plan Note (Signed)
hgba1c to be checked, minimize simple carbs. Increase exercise as tolerated. Continue current meds  

## 2018-04-24 NOTE — Assessment & Plan Note (Signed)
Well controlled, no changes to meds. Encouraged heart healthy diet such as the DASH diet and exercise as tolerated.  °

## 2018-04-29 ENCOUNTER — Encounter: Payer: Self-pay | Admitting: *Deleted

## 2018-05-08 ENCOUNTER — Encounter (INDEPENDENT_AMBULATORY_CARE_PROVIDER_SITE_OTHER): Payer: Self-pay | Admitting: Physical Medicine and Rehabilitation

## 2018-05-08 ENCOUNTER — Ambulatory Visit (INDEPENDENT_AMBULATORY_CARE_PROVIDER_SITE_OTHER): Payer: Medicare Other | Admitting: Physical Medicine and Rehabilitation

## 2018-05-08 ENCOUNTER — Other Ambulatory Visit: Payer: Self-pay

## 2018-05-08 ENCOUNTER — Ambulatory Visit (INDEPENDENT_AMBULATORY_CARE_PROVIDER_SITE_OTHER): Payer: Self-pay

## 2018-05-08 VITALS — Temp 98.4°F

## 2018-05-08 DIAGNOSIS — M461 Sacroiliitis, not elsewhere classified: Secondary | ICD-10-CM

## 2018-05-08 NOTE — Progress Notes (Signed)
Sierra Ford - 78 y.o. female MRN [id number]  Date of birth: 10-27-1940  Office Visit Note: Visit Date: 05/08/2018 PCP: Ann Held, DO Referred by: Roma Schanz R, *  Subjective: No chief complaint on file.  HPI: Sierra Ford is a 78 y.o. female who comes in today At the request of Dr. Legrand Como hilts for diagnostic and hopefully therapeutic right sacroiliac joint injection fluoroscopic guidance.  Prior injection using fluoroscopic guidance I did give her several hours of diagnostic relief.  We will go ahead and complete the injection today.  MRI of the lumbar spine was also performed given April of last year and does show severe central stenosis at L3-4 with some foraminal narrowing right more than left L4 and L5.  If she does not get relief is lasting with the sacroiliac joint would suggest possible diagnostic transforaminal epidural steroid injection.  ROS Otherwise per HPI.  Assessment & Plan: Visit Diagnoses:  1. Sacroiliitis ([place])     Plan: No additional findings.   Meds & Orders: No orders of the defined types were placed in this encounter.   Orders Placed This Encounter  Procedures  . Sacroiliac Joint Inj  . XR C-ARM NO REPORT    Follow-up: Return if symptoms worsen or fail to improve.   Procedures: Sacroiliac Joint Inj on 05/08/2018 2:48 PM Indications: pain and diagnostic evaluation Details: 22 G 3.5 in needle, fluoroscopy-guided posterior approach Medications: 2 mL bupivacaine 0.5 %; 80 mg methylPREDNISolone acetate 80 MG/ML Outcome: tolerated well, no immediate complications  There was excellent flow of contrast producing a partial arthrogram of the sacroiliac joint.  Procedure, treatment alternatives, risks and benefits explained, specific risks discussed. Consent was given by the patient. Immediately prior to procedure a time out was called to verify the correct patient, procedure, equipment, support staff and site/side marked as required.  Patient was prepped and draped in the usual sterile fashion.      No notes on file   Clinical History: MRI LUMBAR SPINE WITHOUT CONTRAST  TECHNIQUE: Multiplanar, multisequence MR imaging of the lumbar spine was performed. No intravenous contrast was administered.  COMPARISON:  Lumbar spine radiograph March 13, 2017  FINDINGS: SEGMENTATION: For the purposes of this report, the last well-formed intervertebral disc is reported as L5-S1.  ALIGNMENT: Maintained lumbar lordosis. Grade 1 (4 mm) L3-4 anterolisthesis. No spondylolysis.  VERTEBRAE:Vertebral bodies are intact. Moderate to severe L3-4 disc height loss. Remaining disc morphology generally preserved with mild disc desiccation mid lumbar discs. Mild chronic discogenic endplate changes F7-4. No suspicious or acute bone marrow signal.  CONUS MEDULLARIS AND CAUDA EQUINA: Conus medullaris terminates at T12-L1 and demonstrates normal morphology and signal characteristics. Cauda equina is normal.  PARASPINAL AND OTHER SOFT TISSUES: Nonacute. Asymmetrically prominent T1 and T2 bright fat signal RIGHT paraspinal muscles at lumbosacral junction favoring small intramuscular lipoma. Dependent subcutaneous edema.  DISC LEVELS:  T12-L1, L1-2: No disc bulge, canal stenosis nor neural foraminal narrowing. Mild facet arthropathy.  L2-3: Annular bulging. Moderate facet arthropathy and ligamentum flavum redundancy without canal stenosis or neural foraminal narrowing.  L3-4: Anterolisthesis. Moderate to severe facet arthropathy and ligamentum flavum redundancy. Central annular fissure. Severe canal stenosis, AP dimension of the thecal sac is 5 mm. Lateral recess narrowing may affect the traversing L4 nerves. Moderate bilateral neural foraminal narrowing.  L4-5: 3 mm broad-based disc bulge asymmetric to the RIGHT. RIGHT annular fissure. Moderate facet arthropathy and ligamentum flavum redundancy. Mild canal stenosis.  Moderate RIGHT, mild-to-moderate LEFT neural  foraminal narrowing.  L5-S1: 2 mm broad-based disc bulge asymmetric to the RIGHT. Moderate to severe RIGHT, mild LEFT facet arthropathy. No canal stenosis. Minimal RIGHT neural foraminal narrowing.  IMPRESSION: 1. Grade 1 L3-4 anterolisthesis without spondylolysis. No fracture or acute osseous process. 2. 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  Mild canal stenosis L4-5. 3. Neural foraminal narrowing L3-4 through L5-S1: Moderate at L3-4 and L4-5.   Electronically Signed   By: Elon Alas M.D.   On: 05/23/2017 14:12   She reports that she quit smoking about 8 years ago. Her smoking use included cigarettes. She has a 50.00 pack-year smoking history. She has never used smokeless tobacco.  Recent Labs    10/24/17 1013 01/28/18 0924 04/24/18 0905  HGBA1C 6.7* 6.5* 6.7*    Objective:  VS:  HT:    WT:   BMI:     BP:   HR: bpm  TEMP:98.4 F (36.9 C)(Oral)  RESP:  Physical Exam  Ortho Exam Imaging: No results found.  Past Medical/Family/Surgical/Social History: Medications & Allergies reviewed per EMR, new medications updated. Patient Active Problem List   Diagnosis Date Noted  . Hyperlipidemia associated with type 2 diabetes mellitus ([place]) 04/24/2018  . Right ankle swelling 05/13/2017  . DM (diabetes mellitus) type II uncontrolled, periph vascular disorder ([place]) 04/08/2017  . Essential hypertension 04/08/2017  . Atherosclerosis of aorta ([place]) 04/08/2017  . Abnormal auditory perception of both ears 10/01/2016  . Bilateral impacted cerumen 10/01/2016  . OSA (obstructive sleep apnea) 12/16/2015  . DOE (dyspnea on exertion) 06/12/2013  . Obesity (BMI 30-39.9) 04/24/2013  . Chest pain, unspecified 01/22/2011  . Abnormal stress test 01/22/2011  . Frequent urination 01/22/2011  . Chronic diastolic heart failure ([place]) 05/15/2009  . 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  . RASH-NONVESICULAR 04/20/2009  . CERVICAL STRAIN,  WITH RADICULOPATHY 03/31/2009  . CAROTID ARTERY DISEASE 01/12/2009  . CERVICAL STRAIN 01/11/2009  . LEG EDEMA, RIGHT 07/27/2008  . HX, URINARY INFECTION 09/19/2006  . Hyperlipidemia LDL goal <70 08/20/2006  . PREGNANCY, ECTOPIC NEC W/INTRAUTERINE PRG 08/20/2006  . FREQUENCY, URINARY 08/20/2006  . Other specified abnormal findings of blood chemistry 08/20/2006  . Personal history of venous thrombosis and embolism 08/20/2006   Past Medical History:  Diagnosis Date  . Anxiety   . Back pain   . Chest pain    CLite with apical ischemia in 2006 - normal coronary arteries by [facility] in 12/2004;  Myoview 11/12:  Low risk stress nuclear study with a small, partially reversible apical defect most likely related to apical thinning; cannot R/O very mild apical ischemia.  EF: 75%   . Decreased hearing   . Depression   . Diabetes ([place])   . DVT (deep venous thrombosis) ([place]) 2006   hx of  . Ectopic pregnancy with intrauterine pregnancy   . Hiatal hernia   . Hyperlipidemia   . PE (pulmonary embolism) 2006   hx of  . Sinus congestion   . Sleep apnea    CPAP    Family History  Problem Relation Age of Onset  . Heart failure Mother        died from  . Colon polyps Mother   . Hypertension Mother   . Sudden death Mother   . Obesity Mother   . Alcoholism Father   . Diabetes Maternal Museum/gallery exhibitions officer  . Diabetes Maternal Aunt   . Colon cancer Neg Hx   . Esophageal cancer Neg Hx   . Stomach cancer  Neg Hx    Past Surgical History:  Procedure Laterality Date  . ABDOMINAL EXPLORATION SURGERY    . BUNIONECTOMY     right  . COLONOSCOPY  2009  . ECTOPIC PREGNANCY SURGERY    . FOOT SURGERY    . TUBAL LIGATION     Social History   Occupational History  . Occupation: Retired Scientist, research (medical)  Tobacco Use  . Smoking status: Former Smoker    Packs/day: 1.00    Years: 50.00    Pack years: 50.00    Types: Cigarettes    Last attempt to quit: 11/21/2009    Years since quitting: 8.4  .  Smokeless tobacco: Never Used  Substance and Sexual Activity  . Alcohol use: Yes    Comment: rare 3 times a year per pt  . Drug use: No  . Sexual activity: Not Currently    Partners: Male

## 2018-05-12 MED ORDER — BUPIVACAINE HCL 0.5 % IJ SOLN
2.0000 mL | INTRAMUSCULAR | Status: AC | PRN
  Administered 2018-05-08: 2 mL via INTRA_ARTICULAR

## 2018-05-12 MED ORDER — METHYLPREDNISOLONE ACETATE 80 MG/ML IJ SUSP
80.0000 mg | INTRAMUSCULAR | Status: AC | PRN
  Administered 2018-05-08: 80 mg via INTRA_ARTICULAR

## 2018-05-15 ENCOUNTER — Telehealth: Payer: Self-pay | Admitting: Family Medicine

## 2018-05-15 NOTE — Telephone Encounter (Signed)
Patient would like to start taking Cherry Tart Tablets 100 mg, she would like to know from the provider if this is okay to take and stated that it can be left as a yes or no on her voicemail [phone]

## 2018-05-16 ENCOUNTER — Encounter: Payer: Self-pay | Admitting: Family Medicine

## 2018-05-21 ENCOUNTER — Other Ambulatory Visit: Payer: Medicare PPO

## 2018-05-26 ENCOUNTER — Other Ambulatory Visit: Payer: Medicare PPO

## 2018-05-27 ENCOUNTER — Ambulatory Visit: Payer: Medicare PPO

## 2018-06-05 DIAGNOSIS — G4733 Obstructive sleep apnea (adult) (pediatric): Secondary | ICD-10-CM | POA: Diagnosis not present

## 2018-06-26 ENCOUNTER — Telehealth: Payer: Self-pay | Admitting: Cardiology

## 2018-06-26 NOTE — Progress Notes (Signed)
Virtual Visit via Video Note   This visit type was conducted due to national recommendations for restrictions regarding the COVID-19 Pandemic (e.g. social distancing) in an effort to limit this patient's exposure and mitigate transmission in our community.  Due to her co-morbid illnesses, this patient is at least at moderate risk for complications without adequate follow up.  This format is felt to be most appropriate for this patient at this time.  All issues noted in this document were discussed and addressed.  A limited physical exam was performed with this format.  Please refer to the patient's chart for her consent to telehealth for [facility].   Date:  06/27/2018   ID:  Sierra Ford, DOB 1940/02/20, MRN [id number]  Patient Location: Home Provider Location: Home  PCP:  Ann Held, DO  Cardiologist:  Minus Breeding, MD  Electrophysiologist:  None   Evaluation Performed:  Follow-Up Visit  Chief Complaint:  SOB  History of Present Illness:     Sierra Ford is a 78 y.o. female presents for follow up of diastolic dysfunction and abnormal echo. Echo 04/16/17  demonstrated an EF of 65% I saw her last year and she returns for follow up.  Since I last saw her she has had no new complaints.  She has gained about 15 lbs from eating too much.  She reports that she has chronic dyspnea with exertion. However, this is not really different than previous.  The patient denies any new symptoms such as chest discomfort, neck or arm discomfort. There has been no new PND or orthopnea. There have been no reported palpitations, presyncope or syncope.   She is not having cough fevers or chills.  She drinks a great deal of water.    The patient does not have symptoms concerning for COVID-19 infection (fever, chills, cough, or new shortness of breath).    Past Medical History:  Diagnosis Date  . Anxiety   . Back pain   . Chest pain    CLite with apical ischemia in 2006 - normal  coronary arteries by [facility] in 12/2004;  Myoview 11/12:  Low risk stress nuclear study with a small, partially reversible apical defect most likely related to apical thinning; cannot R/O very mild apical ischemia.  EF: 75%   . Decreased hearing   . Depression   . Diabetes ([place])   . DVT (deep venous thrombosis) ([place]) 2006   hx of  . Ectopic pregnancy with intrauterine pregnancy   . Hiatal hernia   . Hyperlipidemia   . PE (pulmonary embolism) 2006   hx of  . Sinus congestion   . Sleep apnea    CPAP    Past Surgical History:  Procedure Laterality Date  . ABDOMINAL EXPLORATION SURGERY    . BUNIONECTOMY     right  . COLONOSCOPY  2009  . ECTOPIC PREGNANCY SURGERY    . FOOT SURGERY    . TUBAL LIGATION       Current Meds  Medication Sig  . aspirin EC 81 MG tablet Take 81 mg by mouth at bedtime.    . blood glucose meter kit and supplies KIT Dispense based on patient and insurance preference. Use up to four times daily as directed. (FOR ICD-9 250.00, 250.01).  . CALCIUM-VITAMIN D PO Take 600 Units by mouth 2 (two) times daily.    . cholecalciferol (VITAMIN D-400) 400 UNITS TABS Take 400 Units by mouth 2 (two) times daily.   . Coenzyme Q10  100 MG TABS Take 100 mg by mouth daily.  Marland Kitchen etodolac (LODINE) 400 MG tablet Take 1 tablet (400 mg total) by mouth 2 (two) times daily as needed.  . ezetimibe (ZETIA) 10 MG tablet TAKE 1 TABLET BY MOUTH ONCE DAILY  . Flaxseed, Linseed, (FLAXSEED OIL PO) Take by mouth.  . furosemide (LASIX) 40 MG tablet Take 1 tablet (40 mg total) by mouth daily.  . metFORMIN (GLUCOPHAGE-XR) 500 MG 24 hr tablet TAKE 1 TABLET BY MOUTH ONCE DAILY WITH BREAKFAST  . Omega-3 Fatty Acids (FISH OIL PO) Take by mouth.  Marland Kitchen POTASSIUM CHLORIDE PO Take 1 tablet by mouth as needed.  . rosuvastatin (CRESTOR) 10 MG tablet Take 10 mg by mouth daily.  Marland Kitchen tiZANidine (ZANAFLEX) 2 MG tablet Take 1-2 tablets (2-4 mg total) by mouth at bedtime as needed for muscle spasms.  Marland Kitchen triamcinolone cream  (KENALOG) 0.1 % Apply 1 application topically 2 (two) times daily.  . TRUE METRIX BLOOD GLUCOSE TEST test strip TEST  UP  TO FOUR TIMES DAILY AS DIRECTED  . TRUEPLUS LANCETS 33G MISC TEST  UP TO FOUR TIMES DAILY AS DIRECTED  . Vitamin D, Ergocalciferol, (DRISDOL) 1.25 MG (50000 UT) CAPS capsule Take 1 capsule (50,000 Units total) by mouth every 7 (seven) days.  . Zoster Vaccine Adjuvanted [facility]) injection 0.36m IM x1  Repeat in 2-6 months   Current Facility-Administered Medications for the 06/27/18 encounter (Telemedicine) with HMinus Breeding MD  Medication  . 0.9 %  sodium chloride infusion  . methylPREDNISolone acetate (DEPO-MEDROL) injection 40 mg     Allergies:   Patient has no known allergies.   Social History   Tobacco Use  . Smoking status: Former Smoker    Packs/day: 1.00    Years: 50.00    Pack years: 50.00    Types: Cigarettes    Last attempt to quit: 11/21/2009    Years since quitting: 8.6  . Smokeless tobacco: Never Used  Substance Use Topics  . Alcohol use: Yes    Comment: rare 3 times a year per pt  . Drug use: No     Family Hx: The patient's family history includes Alcoholism in her father; Colon polyps in her mother; Diabetes in her maternal aunt and maternal aunt; Heart failure in her mother; Hypertension in her mother; Obesity in her mother; Sudden death in her mother. There is no history of Colon cancer, Esophageal cancer, or Stomach cancer.  ROS:   Please see the history of present illness.    Positive for back pain Otherwise as stated in the HPI and negative for all other systems.   Prior CV studies:   The following studies were reviewed today:    Labs/Other Tests and Data Reviewed:    EKG:  No ECG reviewed.  Recent Labs: 10/24/2017: Hemoglobin 15.0; Platelets 236.0 03/04/2018: BNP 37.6 04/24/2018: ALT 25; BUN 27; Creatinine, Ser 0.89; Potassium 4.6; Sodium 140   Recent Lipid Panel Lab Results  Component Value Date/Time   CHOL 230 (H)  04/24/2018 09:05 AM   CHOL 198 11/18/2017 08:47 AM   TRIG 70.0 04/24/2018 09:05 AM   HDL 125.90 04/24/2018 09:05 AM   HDL 113 11/18/2017 08:47 AM   CHOLHDL 2 04/24/2018 09:05 AM   LDLCALC 90 04/24/2018 09:05 AM   LDLCALC 74 11/18/2017 08:47 AM   LDLDIRECT 95.9 10/06/2012 12:48 PM    Wt Readings from Last 3 Encounters:  06/27/18 193 lb (87.5 kg)  04/24/18 186 lb 12.8 oz (84.7 kg)  03/04/18 188 lb (85.3 kg)     Objective:    Vital Signs:  Ht _0  (1.575 m)   Wt 193 lb (87.5 kg)   BMI 35.30 kg/m    VITAL SIGNS:  reviewed GEN:  no acute distress EYES:  sclerae anicteric, EOMI - Extraocular Movements Intact NEURO:  alert and oriented x 3, no obvious focal deficit PSYCH:  normal affect  ASSESSMENT & PLAN:    DIASTOLIC HF:  I had a long discussion with her about this.  She was very confused about the diagnosis of HF.  I explained this and that we treat this by reducing her fluid intake and managing salt.  I did see her labs and her creat was 0.89.      DM:   A1c was 6.7.   Continue current therapy.   SLEEP APNEA:    She wears her CPAP.    DYSLIPIDEMIA:  She is being followed in the Lipid Clinic.   LDL was 90.  Continue current therapy.  COVID-19 Education: The signs and symptoms of COVID-19 were discussed with the patient and how to seek care for testing (follow up with PCP or arrange E-visit).  The importance of social distancing was discussed today.  Time:   Today, I have spent 20 minutes with the patient with telehealth technology discussing the above problems.     Medication Adjustments/Labs and Tests Ordered: Current medicines are reviewed at length with the patient today.  Concerns regarding medicines are outlined above.   Tests Ordered: No orders of the defined types were placed in this encounter.   Medication Changes: No orders of the defined types were placed in this encounter.   Disposition:  Follow up prn  Signed, Minus Breeding, MD  06/27/2018 9:47  AM    [place]

## 2018-06-27 ENCOUNTER — Encounter: Payer: Self-pay | Admitting: Cardiology

## 2018-06-27 ENCOUNTER — Telehealth (INDEPENDENT_AMBULATORY_CARE_PROVIDER_SITE_OTHER): Payer: Medicare Other | Admitting: Cardiology

## 2018-06-27 VITALS — Ht 62.0 in | Wt 193.0 lb

## 2018-06-27 DIAGNOSIS — I5032 Chronic diastolic (congestive) heart failure: Secondary | ICD-10-CM

## 2018-06-27 DIAGNOSIS — R0602 Shortness of breath: Secondary | ICD-10-CM | POA: Diagnosis not present

## 2018-06-27 DIAGNOSIS — Z7189 Other specified counseling: Secondary | ICD-10-CM

## 2018-06-27 NOTE — Patient Instructions (Signed)

## 2018-07-08 ENCOUNTER — Telehealth: Payer: Self-pay | Admitting: Pharmacist

## 2018-07-08 NOTE — Telephone Encounter (Signed)
LMOM for pt to move lipid appt back 1 month to mid July due to COVID-19 pandemic.

## 2018-07-09 ENCOUNTER — Other Ambulatory Visit: Payer: Self-pay | Admitting: Family Medicine

## 2018-07-09 DIAGNOSIS — E119 Type 2 diabetes mellitus without complications: Secondary | ICD-10-CM

## 2018-07-22 ENCOUNTER — Other Ambulatory Visit: Payer: Medicare Other

## 2018-07-23 ENCOUNTER — Ambulatory Visit: Payer: Medicare Other

## 2018-07-30 ENCOUNTER — Ambulatory Visit: Payer: Medicare Other | Admitting: Pulmonary Disease

## 2018-08-12 ENCOUNTER — Telehealth: Payer: Self-pay | Admitting: Family Medicine

## 2018-08-12 DIAGNOSIS — E119 Type 2 diabetes mellitus without complications: Secondary | ICD-10-CM

## 2018-08-14 NOTE — Telephone Encounter (Signed)
Change to metformin 500 mg bid #60  #180 1 refill

## 2018-08-14 NOTE — Telephone Encounter (Signed)
Patient is waiting on the status of this new refill medication because she has stopped taking the other medication three days ago, and is in need of the new medication.  She said it was recalled.

## 2018-08-15 MED ORDER — METFORMIN HCL 500 MG PO TABS: 500.0000 mg | ORAL_TABLET | Freq: Two times a day (BID) | ORAL | 1 refills | Status: DC

## 2018-08-15 NOTE — Telephone Encounter (Signed)
Rx sent 

## 2018-08-19 NOTE — Telephone Encounter (Signed)
Pt has questions regarding new rx for metFORMIN 500mg . She would like to speak with Dr. Nonda Lou assistant to confirm that she is supposed to take medication 2x per day. Please advise.

## 2018-08-19 NOTE — Telephone Encounter (Signed)
Spoke w/ Pt- answered her questions.

## 2018-08-20 NOTE — Telephone Encounter (Signed)
Open n error °

## 2018-08-25 ENCOUNTER — Telehealth: Payer: Self-pay

## 2018-08-25 NOTE — Telephone Encounter (Signed)

## 2018-08-25 NOTE — Telephone Encounter (Signed)
Unable to lmom for prescreen  

## 2018-08-26 ENCOUNTER — Encounter: Payer: Self-pay | Admitting: Family Medicine

## 2018-08-26 ENCOUNTER — Other Ambulatory Visit: Payer: Medicare Other

## 2018-08-26 ENCOUNTER — Other Ambulatory Visit: Payer: Self-pay

## 2018-08-26 DIAGNOSIS — E785 Hyperlipidemia, unspecified: Secondary | ICD-10-CM

## 2018-08-26 LAB — HEPATIC FUNCTION PANEL
ALT: 31 IU/L (ref 0–32)
AST: 25 IU/L (ref 0–40)
Albumin: 4.7 g/dL (ref 3.7–4.7)
Alkaline Phosphatase: 98 IU/L (ref 39–117)
Bilirubin Total: 1.1 mg/dL (ref 0.0–1.2)
Bilirubin, Direct: 0.25 mg/dL (ref 0.00–0.40)
Total Protein: 6.9 g/dL (ref 6.0–8.5)

## 2018-08-26 LAB — LIPID PANEL
Chol/HDL Ratio: 2.1 ratio (ref 0.0–4.4)
Cholesterol, Total: 209 mg/dL — ABNORMAL HIGH (ref 100–199)
HDL: 101 mg/dL (ref >39)
LDL Calculated: 88 mg/dL (ref 0–99)
Triglycerides: 102 mg/dL (ref 0–149)
VLDL Cholesterol Cal: 20 mg/dL (ref 5–40)

## 2018-08-27 ENCOUNTER — Ambulatory Visit: Payer: Medicare Other

## 2018-08-29 ENCOUNTER — Ambulatory Visit: Payer: Medicare Other

## 2018-08-29 ENCOUNTER — Other Ambulatory Visit: Payer: Self-pay

## 2018-08-29 ENCOUNTER — Telehealth (INDEPENDENT_AMBULATORY_CARE_PROVIDER_SITE_OTHER): Payer: Self-pay | Admitting: Pharmacist

## 2018-08-29 DIAGNOSIS — E785 Hyperlipidemia, unspecified: Secondary | ICD-10-CM

## 2018-08-29 MED ORDER — PRALUENT 75 MG/ML ~~LOC~~ SOAJ: 1.0000 "pen " | SUBCUTANEOUS | 11 refills | Status: DC

## 2018-08-29 MED ORDER — PRALUENT 75 MG/ML ~~LOC~~ SOAJ: 1.0000 "pen " | SUBCUTANEOUS | 1 refills | Status: DC

## 2018-08-29 NOTE — Progress Notes (Addendum)
Patient ID: Sierra Ford                 DOB: 01-10-1941                    MRN: [id number]     HPI: Sierra Ford is a 78 y.o. female patient of Dr. Percival Ford that presents today for lipid follow up. PMH is significant for DM, mild-moderate carotid plaque on ultrasound, HLD, DVT and PE (2006), and OSA. She is a longstanding patient of ours, with a history of statin intolerance with atorvastatin, pravastatin, rosuvastatin 52m plusLovaza, but did not tolerate these due to myalgias. Currently taking Crestor 166mdaily and Zetia 1054maily and tolerating them well. Pt prefers to follow up every 6 months with lipid panel and office visit; this helps with accountability for lifestyle improvements. Pt has a history of statin intolerance with atorvastatin, pravastatin, rosuvastatin 2m45musLovaza, but did not tolerate these due to myalgias. At last visit 6 months ago, pt was advised to increase Crestor to 2mg30mew days a week as tolerated and continue 10mg 77mother days.  Visit today conducted via telehealth due to COVID-19 pandemic. Pt reports she has been staying home more and eating a bit worse due to COVID - eating more ice cream in particular. She has still been trying to stay active with 30 minute work out home tapes, but has been unable to go to the gym or water aerobics classes due to COVID.[place] also been experiencing back pain and has gotten 2 back injections so far.  Current Medications: rosuvastatin 10mg d63m - max tolerated dose, ezetimibe 10mg da64mIntolerances: rosuvastatin 2mg - m8me aches, atorvastatin and pravastatin - muscle aches, Lovaza - muscle aches Risk Factors: CAD - mild-moderate carotid plaque on ultrasound, DM LDL goal: <70mg/dL d26mo carotid plaque  Diet:  Breakfast - egg or yogurt, low carb toast Lunch - 4 oz lean meat, fruit, some toast Dinner - 8 oz lean meat, 2 cups veggies Snacks - 3 a day under 100 calories - granola bar, pudding, ice cream   Exercise: Pt exercises with Silver Sneakers three times a week, walks other days, and does water aerobics every day. Has lost 23 lbs in the past 6 months.  Family History: Mother died from HF, mother had colon polyps and Alzheimer's disease, aunt and granddaughter has diabetes.  Social History: Former smoker: 1ppd x 50 years. Quit date: 11/21/2009. Minimal alcohol use, no drug use.  Labs: 08/26/18: TC 209, TG 102, HDL 101, LDL 88 (Crestor 10mg daily29mtia 10mg daily)68m/20: TC 230, TG 70, HDL 126, LDL 90 (Crestor 10mg daily, 45ma 10mg daily) 959m19: TC 198, TG 57, HDL 113, LDL 74 (Crestor 10mg daily, Ze103m10mg daily) 3/271m: TC 195, TG 58, HDL 95, LDL 88 (Crestor 10mg daily, Zeti67mmg daily) 9/25/24mTC 197, TG 55, HDL 112, LDL 74 (Crestor 10mg daily, Zetia 37m daily) 09/27/16:37m200, TG 62, HDL 101, LDL 87 (Crestor 10mg daily, Zetia 1054maily) 05/15/16: 78m17, TG 76, HDL 115, LDL 87 (Crestor 10mg daily, Zetia 10mg24mly) 10/2015: TC 28m TG 97, HDL 106, LDL 69 (Crestor 10mg daily, Zetia 10mg d82m) 04/2015: TC 2358mG 70, HDL 127, LDL 91, LFTs wnl (Crestor 10mg daily) 10/2014: TC 2625m 93, HDL 105.4, LDL 138, LFTs wnl (Crestor 10mg daily) - admits to man87metary indiscretions this summer 05/2014: TC 184, TG 75, HDL 82.7, LDL 86, LFTs wnl (Crestor 10mg18m  daily)  Past Medical History:  Diagnosis Date  . Anxiety   . Back pain   . Chest pain    CLite with apical ischemia in 2006 - normal coronary arteries by [facility] in 12/2004;  Myoview 11/12:  Low risk stress nuclear study with a small, partially reversible apical defect most likely related to apical thinning; cannot R/O very mild apical ischemia.  EF: 75%   . Decreased hearing   . Depression   . Diabetes ([place])   . DVT (deep venous thrombosis) ([place]) 2006   hx of  . Ectopic pregnancy with intrauterine pregnancy   . Hiatal hernia   . Hyperlipidemia   . PE (pulmonary embolism) 2006   hx of  . Sinus congestion   . Sleep apnea    CPAP      Current Outpatient Medications on File Prior to Visit  Medication Sig Dispense Refill  . aspirin EC 81 MG tablet Take 81 mg by mouth at bedtime.      . blood glucose meter kit and supplies KIT Dispense based on patient and insurance preference. Use up to four times daily as directed. (FOR ICD-9 250.00, 250.01). 1 each 0  . CALCIUM-VITAMIN D PO Take 600 Units by mouth 2 (two) times daily.      . cholecalciferol (VITAMIN D-400) 400 UNITS TABS Take 400 Units by mouth 2 (two) times daily.     . Coenzyme Q10 100 MG TABS Take 100 mg by mouth daily.    Marland Kitchen etodolac (LODINE) 400 MG tablet Take 1 tablet (400 mg total) by mouth 2 (two) times daily as needed. 60 tablet 3  . ezetimibe (ZETIA) 10 MG tablet TAKE 1 TABLET BY MOUTH ONCE DAILY 90 tablet 2  . Flaxseed, Linseed, (FLAXSEED OIL PO) Take by mouth.    . furosemide (LASIX) 40 MG tablet Take 1 tablet (40 mg total) by mouth daily. 90 tablet 1  . metFORMIN (GLUCOPHAGE) 500 MG tablet Take 1 tablet (500 mg total) by mouth 2 (two) times daily with a meal. 180 tablet 1  . Omega-3 Fatty Acids (FISH OIL PO) Take by mouth.    Marland Kitchen POTASSIUM CHLORIDE PO Take 1 tablet by mouth as needed.    . rosuvastatin (CRESTOR) 10 MG tablet Take 10 mg by mouth daily.    Marland Kitchen tiZANidine (ZANAFLEX) 2 MG tablet Take 1-2 tablets (2-4 mg total) by mouth at bedtime as needed for muscle spasms. 60 tablet 1  . triamcinolone cream (KENALOG) 0.1 % Apply 1 application topically 2 (two) times daily. 45 g 2  . TRUE METRIX BLOOD GLUCOSE TEST test strip TEST  UP  TO FOUR TIMES DAILY AS DIRECTED 200 each 12  . TRUEPLUS LANCETS 33G MISC TEST  UP TO FOUR TIMES DAILY AS DIRECTED 200 each 12  . Vitamin D, Ergocalciferol, (DRISDOL) 1.25 MG (50000 UT) CAPS capsule Take 1 capsule (50,000 Units total) by mouth every 7 (seven) days. 4 capsule 0  . Zoster Vaccine Adjuvanted [facility]) injection 0.44m IM x1  Repeat in 2-6 months 0.5 mL 1  . [DISCONTINUED] omega-3 acid ethyl esters (LOVAZA) 1 G capsule Take  1 g by mouth 3 (three) times daily.      . [DISCONTINUED] pravastatin (PRAVACHOL) 20 MG tablet Take 20 mg by mouth daily.       Current Facility-Administered Medications on File Prior to Visit  Medication Dose Route Frequency Provider Last Rate Last Dose  . 0.9 %  sodium chloride infusion  500 mL Intravenous Once Nandigam,  Venia Minks, MD      . methylPREDNISolone acetate (DEPO-MEDROL) injection 40 mg  40 mg Intra-articular Once Hilts, Michael, MD        No Known Allergies  Assessment/Plan:  1. Hyperlipidemia: LDL remains above goal < 70, in part due to exercise and dietary limitations due to COVID. Discussed adding PCSK9i therapy to her regimen and she is agreeable. Will submit prior authorization for Repatha. Once approved, will continue rosuvastatin 51m daily but stop ezetimibe 137mdaily (this was costing pt $30/month). Will also apply for copay assistance through [employer]  Megan E. Supple, PharmD, CPP, [id number]. [address] [address]hone: [phone]: ([phone]/11/2018 7:14 AM  Addendum: Prior auth approved through 03/01/19. $2500 grant approved through 07/29/19. ID: [id number]: [id number]: [id number]: : [id number]

## 2018-08-29 NOTE — Addendum Note (Signed)
Addended by: SUPPLE, MEGAN E on: 08/29/2018 11:15 AM   Modules accepted: Orders

## 2018-09-03 ENCOUNTER — Other Ambulatory Visit: Payer: Self-pay | Admitting: Family Medicine

## 2018-09-03 DIAGNOSIS — I10 Essential (primary) hypertension: Secondary | ICD-10-CM

## 2018-09-03 DIAGNOSIS — R600 Localized edema: Secondary | ICD-10-CM

## 2018-09-10 DIAGNOSIS — G4733 Obstructive sleep apnea (adult) (pediatric): Secondary | ICD-10-CM | POA: Diagnosis not present

## 2018-09-26 ENCOUNTER — Other Ambulatory Visit: Payer: Self-pay

## 2018-09-26 DIAGNOSIS — R600 Localized edema: Secondary | ICD-10-CM

## 2018-09-26 DIAGNOSIS — I10 Essential (primary) hypertension: Secondary | ICD-10-CM

## 2018-10-02 ENCOUNTER — Other Ambulatory Visit: Payer: Self-pay

## 2018-10-02 DIAGNOSIS — I10 Essential (primary) hypertension: Secondary | ICD-10-CM

## 2018-10-02 DIAGNOSIS — R600 Localized edema: Secondary | ICD-10-CM

## 2018-10-02 MED ORDER — METFORMIN HCL 500 MG PO TABS: 500.0000 mg | ORAL_TABLET | Freq: Two times a day (BID) | ORAL | 2 refills | Status: DC

## 2018-10-02 MED ORDER — FUROSEMIDE 40 MG PO TABS: 40.0000 mg | ORAL_TABLET | Freq: Every day | ORAL | 2 refills | Status: DC

## 2018-10-02 MED ORDER — ROSUVASTATIN CALCIUM 10 MG PO TABS: 10.0000 mg | ORAL_TABLET | Freq: Every day | ORAL | 2 refills | Status: DC

## 2018-10-13 ENCOUNTER — Encounter: Payer: Self-pay | Admitting: Family Medicine

## 2018-10-21 DIAGNOSIS — H524 Presbyopia: Secondary | ICD-10-CM | POA: Diagnosis not present

## 2018-10-21 DIAGNOSIS — E119 Type 2 diabetes mellitus without complications: Secondary | ICD-10-CM | POA: Diagnosis not present

## 2018-10-23 ENCOUNTER — Other Ambulatory Visit: Payer: Self-pay | Admitting: Pharmacist

## 2018-10-23 MED ORDER — PRALUENT 75 MG/ML ~~LOC~~ SOAJ: 1.0000 "pen " | SUBCUTANEOUS | 11 refills | Status: DC

## 2018-10-28 ENCOUNTER — Encounter: Payer: Medicare Other | Admitting: Family Medicine

## 2018-11-04 ENCOUNTER — Encounter: Payer: Self-pay | Admitting: Family Medicine

## 2018-11-05 ENCOUNTER — Other Ambulatory Visit: Payer: Self-pay

## 2018-11-06 ENCOUNTER — Encounter: Payer: Self-pay | Admitting: Family Medicine

## 2018-11-06 ENCOUNTER — Ambulatory Visit (INDEPENDENT_AMBULATORY_CARE_PROVIDER_SITE_OTHER): Payer: Medicare Other | Admitting: Family Medicine

## 2018-11-06 VITALS — BP 122/68 | HR 74 | Temp 97.5°F | Resp 18 | Ht 62.0 in | Wt 201.4 lb

## 2018-11-06 DIAGNOSIS — L309 Dermatitis, unspecified: Secondary | ICD-10-CM

## 2018-11-06 DIAGNOSIS — I10 Essential (primary) hypertension: Secondary | ICD-10-CM | POA: Diagnosis not present

## 2018-11-06 DIAGNOSIS — G8929 Other chronic pain: Secondary | ICD-10-CM

## 2018-11-06 DIAGNOSIS — M545 Low back pain, unspecified: Secondary | ICD-10-CM

## 2018-11-06 DIAGNOSIS — Z0000 Encounter for general adult medical examination without abnormal findings: Secondary | ICD-10-CM | POA: Diagnosis not present

## 2018-11-06 DIAGNOSIS — E785 Hyperlipidemia, unspecified: Secondary | ICD-10-CM | POA: Diagnosis not present

## 2018-11-06 DIAGNOSIS — M25512 Pain in left shoulder: Secondary | ICD-10-CM | POA: Diagnosis not present

## 2018-11-06 DIAGNOSIS — E1165 Type 2 diabetes mellitus with hyperglycemia: Secondary | ICD-10-CM

## 2018-11-06 DIAGNOSIS — E1169 Type 2 diabetes mellitus with other specified complication: Secondary | ICD-10-CM | POA: Diagnosis not present

## 2018-11-06 DIAGNOSIS — Z23 Encounter for immunization: Secondary | ICD-10-CM

## 2018-11-06 LAB — COMPREHENSIVE METABOLIC PANEL
ALT: 25 U/L (ref 0–35)
AST: 21 U/L (ref 0–37)
Albumin: 4.5 g/dL (ref 3.5–5.2)
Alkaline Phosphatase: 102 U/L (ref 39–117)
BUN: 21 mg/dL (ref 6–23)
CO2: 32 mEq/L (ref 19–32)
Calcium: 10.4 mg/dL (ref 8.4–10.5)
Chloride: 100 mEq/L (ref 96–112)
Creatinine, Ser: 0.91 mg/dL (ref 0.40–1.20)
GFR: 72.51 mL/min (ref >60.00)
Glucose, Bld: 121 mg/dL — ABNORMAL HIGH (ref 70–99)
Potassium: 4.4 mEq/L (ref 3.5–5.1)
Sodium: 140 mEq/L (ref 135–145)
Total Bilirubin: 1 mg/dL (ref 0.2–1.2)
Total Protein: 7.2 g/dL (ref 6.0–8.3)

## 2018-11-06 LAB — LIPID PANEL
Cholesterol: 189 mg/dL (ref 0–200)
HDL: 105.6 mg/dL (ref >39.00)
LDL Cholesterol: 61 mg/dL (ref 0–99)
NonHDL: 83.63
Total CHOL/HDL Ratio: 2
Triglycerides: 114 mg/dL (ref 0.0–149.0)
VLDL: 22.8 mg/dL (ref 0.0–40.0)

## 2018-11-06 LAB — MICROALBUMIN / CREATININE URINE RATIO
Creatinine,U: 226.9 mg/dL
Microalb Creat Ratio: 0.9 mg/g (ref 0.0–30.0)
Microalb, Ur: 2.1 mg/dL — ABNORMAL HIGH (ref 0.0–1.9)

## 2018-11-06 LAB — HEMOGLOBIN A1C: Hgb A1c MFr Bld: 6.8 % — ABNORMAL HIGH (ref 4.6–6.5)

## 2018-11-06 MED ORDER — NONFORMULARY OR COMPOUNDED ITEM: 0 refills | Status: DC

## 2018-11-06 MED ORDER — TRIAMCINOLONE ACETONIDE 0.1 % EX CREA: 1.0000 "application " | TOPICAL_CREAM | Freq: Two times a day (BID) | CUTANEOUS | 2 refills | Status: DC

## 2018-11-06 MED ORDER — SHINGRIX 50 MCG/0.5ML IM SUSR: INTRAMUSCULAR | 1 refills | Status: DC

## 2018-11-06 MED FILL — SHINGRIX 50 MCG SUS: 50 | 1 days supply | Qty: 1 | Fill #0

## 2018-11-06 MED FILL — BOOSTRIX VACCINE SYRINGE: 5-2.5-18.5 | 1 days supply | Qty: 1 | Fill #0

## 2018-11-06 NOTE — Patient Instructions (Signed)
Preventive Care 66 Years and Older, Female Preventive care refers to lifestyle choices and visits with your health care provider that can promote health and wellness. This includes:  A yearly physical exam. This is also called an annual well check.  Regular dental and eye exams.  Immunizations.  Screening for certain conditions.  Healthy lifestyle choices, such as diet and exercise. What can I expect for my preventive care visit? Physical exam Your health care provider will check:  Height and weight. These may be used to calculate body mass index (BMI), which is a measurement that tells if you are at a healthy weight.  Heart rate and blood pressure.  Your skin for abnormal spots. Counseling Your health care provider may ask you questions about:  Alcohol, tobacco, and drug use.  Emotional well-being.  Home and relationship well-being.  Sexual activity.  Eating habits.  History of falls.  Memory and ability to understand (cognition).  Work and work Statistician.  Pregnancy and menstrual history. What immunizations do I need?  Influenza (flu) vaccine  This is recommended every year. Tetanus, diphtheria, and pertussis (Tdap) vaccine  You may need a Td booster every 10 years. Varicella (chickenpox) vaccine  You may need this vaccine if you have not already been vaccinated. Zoster (shingles) vaccine  You may need this after age 77. Pneumococcal conjugate (PCV13) vaccine  One dose is recommended after age 77. Pneumococcal polysaccharide (PPSV23) vaccine  One dose is recommended after age 77. Measles, mumps, and rubella (MMR) vaccine  You may need at least one dose of MMR if you were born in 1957 or later. You may also need a second dose. Meningococcal conjugate (MenACWY) vaccine  You may need this if you have certain conditions. Hepatitis A vaccine  You may need this if you have certain conditions or if you travel or work in places where you may be exposed  to hepatitis A. Hepatitis B vaccine  You may need this if you have certain conditions or if you travel or work in places where you may be exposed to hepatitis B. Haemophilus influenzae type b (Hib) vaccine  You may need this if you have certain conditions. You may receive vaccines as individual doses or as more than one vaccine together in one shot (combination vaccines). Talk with your health care provider about the risks and benefits of combination vaccines. What tests do I need? Blood tests  Lipid and cholesterol levels. These may be checked every 5 years, or more frequently depending on your overall health.  Hepatitis C test.  Hepatitis B test. Screening  Lung cancer screening. You may have this screening every year starting at age 77 if you have a 30-pack-year history of smoking and currently smoke or have quit within the past 15 years.  Colorectal cancer screening. All adults should have this screening starting at age 77 and continuing until age 56. Your health care provider may recommend screening at age 77 if you are at increased risk. You will have tests every 1-10 years, depending on your results and the type of screening test.  Diabetes screening. This is done by checking your blood sugar (glucose) after you have not eaten for a while (fasting). You may have this done every 1-3 years.  Mammogram. This may be done every 1-2 years. Talk with your health care provider about how often you should have regular mammograms.  BRCA-related cancer screening. This may be done if you have a family history of breast, ovarian, tubal, or peritoneal cancers.  Other tests  Sexually transmitted disease (STD) testing.  Bone density scan. This is done to screen for osteoporosis. You may have this done starting at age 77. Follow these instructions at home: Eating and drinking  Eat a diet that includes fresh fruits and vegetables, whole grains, lean protein, and low-fat dairy products. Limit  your intake of foods with high amounts of sugar, saturated fats, and salt.  Take vitamin and mineral supplements as recommended by your health care provider.  Do not drink alcohol if your health care provider tells you not to drink.  If you drink alcohol: ? Limit how much you have to 0-1 drink a day. ? Be aware of how much alcohol is in your drink. In the [place] one drink equals one 12 oz bottle of beer (355 mL), one 5 oz glass of wine (148 mL), or one 1 oz glass of hard liquor (44 mL). Lifestyle  Take daily care of your teeth and gums.  Stay active. Exercise for at least 30 minutes on 5 or more days each week.  Do not use any products that contain nicotine or tobacco, such as cigarettes, e-cigarettes, and chewing tobacco. If you need help quitting, ask your health care provider.  If you are sexually active, practice safe sex. Use a condom or other form of protection in order to prevent STIs (sexually transmitted infections).  Talk with your health care provider about taking a low-dose aspirin or statin. What's next?  Go to your health care provider once a year for a well check visit.  Ask your health care provider how often you should have your eyes and teeth checked.  Stay up to date on all vaccines. This information is not intended to replace advice given to you by your health care provider. Make sure you discuss any questions you have with your health care provider. Document Released: 03/04/2015 Document Revised: 01/30/2018 Document Reviewed: 01/30/2018 Elsevier Patient Education  2020 [employer].

## 2018-11-06 NOTE — Progress Notes (Signed)
Subjective:     Sierra Ford is a 78 y.o. female and is here for a comprehensive physical exam. The patient reports no problems.  HPI HYPERTENSION   Blood pressure range-good per pt  Chest pain- no      Dyspnea- no Lightheadedness- no   Edema- yes  Other side effects - no   Medication compliance: good Low salt diet- yes    DIABETES    Blood Sugar ranges- not checking  Polyuria- no New Visual problems- no  Hypoglycemic symptoms- no  Other side effects-no Medication compliance - good Last eye exam- due Foot exam- today   HYPERLIPIDEMIA  Medication compliance- good RUQ pain- no  Muscle aches- no Other side effects-no     Social History   Socioeconomic History  . Marital status: Divorced    Spouse name: Not on file  . Number of children: Not on file  . Years of education: Not on file  . Highest education level: Not on file  Occupational History  . Occupation: Retired Scientist, research (medical)  Social Needs  . Financial resource strain: Not on file  . Food insecurity    Worry: Not on file    Inability: Not on file  . Transportation needs    Medical: Not on file    Non-medical: Not on file  Tobacco Use  . Smoking status: Former Smoker    Packs/day: 1.00    Years: 50.00    Pack years: 50.00    Types: Cigarettes    Quit date: 11/21/2009    Years since quitting: 8.9  . Smokeless tobacco: Never Used  Substance and Sexual Activity  . Alcohol use: Yes    Comment: rare 3 times a year per pt  . Drug use: No  . Sexual activity: Not Currently    Partners: Male  Lifestyle  . Physical activity    Days per week: Not on file    Minutes per session: Not on file  . Stress: Not on file  Relationships  . Social Herbalist on phone: Not on file    Gets together: Not on file    Attends religious service: Not on file    Active member of club or organization: Not on file    Attends meetings of clubs or organizations: Not on file    Relationship status: Not on file  .  Intimate partner violence    Fear of current or ex partner: Not on file    Emotionally abused: Not on file    Physically abused: Not on file    Forced sexual activity: Not on file  Other Topics Concern  . Not on file  Social History Narrative   Regular exercise         Health Maintenance  Topic Date Due  . Samul Dada  03/02/2013  . URINE MICROALBUMIN  04/08/2018  . HEMOGLOBIN A1C  10/25/2018  . INFLUENZA VACCINE  05/20/2019 (Originally 09/20/2018)  . OPHTHALMOLOGY EXAM  11/08/2018  . MAMMOGRAM  03/25/2019  . FOOT EXAM  11/06/2019  . DEXA SCAN  Completed  . PNA vac Low Risk Adult  Completed    The following portions of the patient's history were reviewed and updated as appropriate:  She  has a past medical history of Anxiety, Back pain, Chest pain, Decreased hearing, Depression, Diabetes ([place]), DVT (deep venous thrombosis) ([place]) (2006), Ectopic pregnancy with intrauterine pregnancy, Hiatal hernia, Hyperlipidemia, PE (pulmonary embolism) (2006), Sinus congestion, and Sleep apnea. She does not have any  pertinent problems on file. She  has a past surgical history that includes Foot surgery; Abdominal exploration surgery; Ectopic pregnancy surgery; Bunionectomy; Tubal ligation; and Colonoscopy (2009). Her family history includes Alcoholism in her father; Colon polyps in her mother; Diabetes in her maternal aunt and maternal aunt; Heart failure in her mother; Hypertension in her mother; Obesity in her mother; Sudden death in her mother. She  reports that she quit smoking about 8 years ago. Her smoking use included cigarettes. She has a 50.00 pack-year smoking history. She has never used smokeless tobacco. She reports current alcohol use. She reports that she does not use drugs. She has a current medication list which includes the following prescription(s): praluent, aspirin ec, blood glucose meter kit and supplies, calcium-vitamin d, cholecalciferol, coenzyme q10, etodolac, flaxseed  (linseed), furosemide, metformin, omega-3 fatty acids, potassium chloride, rosuvastatin, tizanidine, triamcinolone cream, true metrix blood glucose test, trueplus lancets 33g, vitamin d (ergocalciferol), NONFORMULARY OR COMPOUNDED ITEM, shingrix, omega-3 acid ethyl esters, and pravastatin, and the following Facility-Administered Medications: sodium chloride and methylprednisolone acetate. Current Outpatient Medications on File Prior to Visit  Medication Sig Dispense Refill  . Alirocumab (PRALUENT) 75 MG/ML SOAJ Inject 1 pen into the skin every 14 (fourteen) days. 2 pen 11  . aspirin EC 81 MG tablet Take 81 mg by mouth at bedtime.      . blood glucose meter kit and supplies KIT Dispense based on patient and insurance preference. Use up to four times daily as directed. (FOR ICD-9 250.00, 250.01). 1 each 0  . CALCIUM-VITAMIN D PO Take 600 Units by mouth 2 (two) times daily.      . cholecalciferol (VITAMIN D-400) 400 UNITS TABS Take 400 Units by mouth 2 (two) times daily.     . Coenzyme Q10 100 MG TABS Take 100 mg by mouth daily.    Marland Kitchen etodolac (LODINE) 400 MG tablet Take 1 tablet (400 mg total) by mouth 2 (two) times daily as needed. 60 tablet 3  . Flaxseed, Linseed, (FLAXSEED OIL PO) Take by mouth.    . furosemide (LASIX) 40 MG tablet Take 1 tablet (40 mg total) by mouth daily. 90 tablet 2  . metFORMIN (GLUCOPHAGE) 500 MG tablet Take 1 tablet (500 mg total) by mouth 2 (two) times daily with a meal. 180 tablet 2  . Omega-3 Fatty Acids (FISH OIL PO) Take by mouth.    Marland Kitchen POTASSIUM CHLORIDE PO Take 1 tablet by mouth as needed.    . rosuvastatin (CRESTOR) 10 MG tablet Take 1 tablet (10 mg total) by mouth daily. 90 tablet 2  . tiZANidine (ZANAFLEX) 2 MG tablet Take 1-2 tablets (2-4 mg total) by mouth at bedtime as needed for muscle spasms. 60 tablet 1  . TRUE METRIX BLOOD GLUCOSE TEST test strip TEST  UP  TO FOUR TIMES DAILY AS DIRECTED 200 each 12  . TRUEPLUS LANCETS 33G MISC TEST  UP TO FOUR TIMES DAILY AS  DIRECTED 200 each 12  . Vitamin D, Ergocalciferol, (DRISDOL) 1.25 MG (50000 UT) CAPS capsule Take 1 capsule (50,000 Units total) by mouth every 7 (seven) days. 4 capsule 0  . [DISCONTINUED] omega-3 acid ethyl esters (LOVAZA) 1 G capsule Take 1 g by mouth 3 (three) times daily.      . [DISCONTINUED] pravastatin (PRAVACHOL) 20 MG tablet Take 20 mg by mouth daily.       Current Facility-Administered Medications on File Prior to Visit  Medication Dose Route Frequency Provider Last Rate Last Dose  . 0.9 %  sodium chloride infusion  500 mL Intravenous Once Nandigam, Kavitha V, MD      . methylPREDNISolone acetate (DEPO-MEDROL) injection 40 mg  40 mg Intra-articular Once Hilts, Michael, MD       She has No Known Allergies..  Review of Systems Review of Systems  Constitutional: Negative for activity change, appetite change and fatigue.  HENT: Negative for hearing loss, congestion, tinnitus and ear discharge.  dentist q5mEyes: Negative for visual disturbance (see optho q1y -- vision corrected to 20/20 with glasses).  Respiratory: Negative for cough, chest tightness and shortness of breath.   Cardiovascular: Negative for chest pain, palpitations and leg swelling.  Gastrointestinal: Negative for abdominal pain, diarrhea, constipation and abdominal distention.  Genitourinary: Negative for urgency, frequency, decreased urine volume and difficulty urinating.  Musculoskeletal: Negative for back pain, arthralgias and gait problem.  Skin: Negative for color change, pallor and rash.  Neurological: Negative for dizziness, light-headedness, numbness and headaches.  Hematological: Negative for adenopathy. Does not bruise/bleed easily.  Psychiatric/Behavioral: Negative for suicidal ideas, confusion, sleep disturbance, self-injury, dysphoric mood, decreased concentration and agitation.        Objective:    BP 122/68 (BP Location: Right Arm, Patient Position: Sitting, Cuff Size: Normal)   Pulse 74   Temp  (!) 97.5 F (36.4 C) (Temporal)   Resp 18   Ht _0  (1.575 m)   Wt 201 lb 6.4 oz (91.4 kg)   SpO2 98%   BMI 36.84 kg/m  General appearance: alert, cooperative, appears stated age and no distress Head: Normocephalic, without obvious abnormality, atraumatic Eyes: conjunctivae/corneas clear. PERRL, EOM's intact. Fundi benign. Ears: normal TM's and external ear canals both ears Nose: Nares normal. Septum midline. Mucosa normal. No drainage or sinus tenderness. Throat: lips, mucosa, and tongue normal; teeth and gums normal Neck: no adenopathy, no carotid bruit, no JVD, supple, symmetrical, trachea midline and thyroid not enlarged, symmetric, no tenderness/mass/nodules Back: symmetric, no curvature. ROM normal. No CVA tenderness. Lungs: clear to auscultation bilaterally Breasts: normal appearance, no masses or tenderness Heart: regular rate and rhythm, S1, S2 normal, no murmur, click, rub or gallop Abdomen: soft, non-tender; bowel sounds normal; no masses,  no organomegaly Pelvic: not indicated; post-menopausal, no abnormal Pap smears in past Extremities: extremities normal, atraumatic, no cyanosis or edema Pulses: 2+ and symmetric Skin: Skin color, texture, turgor normal. No rashes or lesions Lymph nodes: Cervical, supraclavicular, and axillary nodes normal. Neurologic: Alert and oriented X 3, normal strength and tone. Normal symmetric reflexes. Normal coordination and gait    Diabetic Foot Exam - Simple   Simple Foot Form Diabetic Foot exam was performed with the following findings: Yes 11/06/2018 12:11 PM  Visual Inspection No deformities, no ulcerations, no other skin breakdown bilaterally: Yes Sensation Testing Intact to touch and monofilament testing bilaterally: Yes Pulse Check Posterior Tibialis and Dorsalis pulse intact bilaterally: Yes Comments     Assessment:    Healthy female exam.      Plan:     ghm utd Check labs See After Visit Summary for Counseling  Recommendations    1. Dermatitis Refill steroid cream - triamcinolone cream (KENALOG) 0.1 %; Apply 1 application topically 2 (two) times daily.  Dispense: 45 g; Refill: 2  2. Chronic left shoulder pain Pt requesting to see ortho - Ambulatory referral to Orthopedic Surgery  3. Low back pain of over 3 months duration Pt would like another opinion from [facility] - Ambulatory referral to Orthopedic Surgery  4. Uncontrolled type 2 diabetes mellitus  with hyperglycemia ([place]) hgba1c to be checked , minimize simple carbs. Increase exercise as tolerated. Continue current meds  - Hemoglobin A1c - Comprehensive metabolic panel - Microalbumin / creatinine urine ratio  5. Essential hypertension bp elevated ---pt ran out of meds meds refilled  - Hemoglobin A1c - Comprehensive metabolic panel - Microalbumin / creatinine urine ratio  6. Hyperlipidemia associated with type 2 diabetes mellitus ([place]) Tolerating statin, encouraged heart healthy diet, avoid trans fats, minimize simple carbs and saturated fats. Increase exercise as tolerated - Lipid panel  7. Need for shingles vaccine  - Zoster Vaccine Adjuvanted [facility]) injection; 0.13m IM x1  Repeat in 2-6 months  Dispense: 0.5 mL; Refill: 1  8. Need for Tdap vaccination   9. Need for Td vaccine  - NONFORMULARY OR COMPOUNDED ITEM; Td   #1    0.5 ml IM x1  Dispense: 1 each; Refill: 0

## 2018-11-15 DIAGNOSIS — M25512 Pain in left shoulder: Secondary | ICD-10-CM | POA: Diagnosis not present

## 2018-11-25 ENCOUNTER — Encounter: Payer: Self-pay | Admitting: Family Medicine

## 2018-11-26 ENCOUNTER — Other Ambulatory Visit: Payer: Self-pay

## 2018-11-26 NOTE — Telephone Encounter (Signed)
LM for pt to call and schedule in office appointment

## 2018-11-27 ENCOUNTER — Ambulatory Visit (INDEPENDENT_AMBULATORY_CARE_PROVIDER_SITE_OTHER): Payer: Medicare Other | Admitting: Family Medicine

## 2018-11-27 ENCOUNTER — Encounter: Payer: Self-pay | Admitting: Family Medicine

## 2018-11-27 ENCOUNTER — Other Ambulatory Visit: Payer: Self-pay

## 2018-11-27 VITALS — BP 122/60 | HR 66 | Temp 97.6°F | Resp 18 | Ht 62.0 in | Wt 202.6 lb

## 2018-11-27 DIAGNOSIS — K625 Hemorrhage of anus and rectum: Secondary | ICD-10-CM | POA: Diagnosis not present

## 2018-11-27 DIAGNOSIS — K649 Unspecified hemorrhoids: Secondary | ICD-10-CM

## 2018-11-27 MED ORDER — HYDROCORTISONE (PERIANAL) 2.5 % EX CREA: 1.0000 "application " | TOPICAL_CREAM | Freq: Two times a day (BID) | CUTANEOUS | 0 refills | Status: DC

## 2018-11-27 NOTE — Progress Notes (Signed)
Patient ID: Sierra Ford, female    DOB: 1941-01-28  Age: 78 y.o. MRN: [id number]    Subjective:  Subjective  HPI Sierra Ford presents for blood in stool and ? Hemorrhoids.  She denies constipation   No abd pain ,  No nvd    Symptoms for a few days   Review of Systems  Constitutional: Negative for appetite change, diaphoresis, fatigue and unexpected weight change.  Eyes: Negative for pain, redness and visual disturbance.  Respiratory: Negative for cough, chest tightness, shortness of breath and wheezing.   Cardiovascular: Negative for chest pain, palpitations and leg swelling.  Gastrointestinal: Positive for blood in stool. Negative for abdominal distention, abdominal pain, constipation, diarrhea, nausea, rectal pain and vomiting.  Endocrine: Negative for cold intolerance, heat intolerance, polydipsia, polyphagia and polyuria.  Genitourinary: Negative for difficulty urinating, dysuria and frequency.  Neurological: Negative for dizziness, light-headedness, numbness and headaches.    History Past Medical History:  Diagnosis Date  . Anxiety   . Back pain   . Chest pain    CLite with apical ischemia in 2006 - normal coronary arteries by [facility] in 12/2004;  Myoview 11/12:  Low risk stress nuclear study with a small, partially reversible apical defect most likely related to apical thinning; cannot R/O very mild apical ischemia.  EF: 75%   . Decreased hearing   . Depression   . Diabetes ([place])   . DVT (deep venous thrombosis) ([place]) 2006   hx of  . Ectopic pregnancy with intrauterine pregnancy   . Hiatal hernia   . Hyperlipidemia   . PE (pulmonary embolism) 2006   hx of  . Sinus congestion   . Sleep apnea    CPAP     She has a past surgical history that includes Foot surgery; Abdominal exploration surgery; Ectopic pregnancy surgery; Bunionectomy; Tubal ligation; and Colonoscopy (2009).   Her family history includes Alcoholism in her father; Colon polyps in her mother; Diabetes  in her maternal aunt and maternal aunt; Heart failure in her mother; Hypertension in her mother; Obesity in her mother; Sudden death in her mother.She reports that she quit smoking about 9 years ago. Her smoking use included cigarettes. She has a 50.00 pack-year smoking history. She has never used smokeless tobacco. She reports current alcohol use. She reports that she does not use drugs.  Current Outpatient Medications on File Prior to Visit  Medication Sig Dispense Refill  . Alirocumab (PRALUENT) 75 MG/ML SOAJ Inject 1 pen into the skin every 14 (fourteen) days. 2 pen 11  . aspirin EC 81 MG tablet Take 81 mg by mouth at bedtime.      . blood glucose meter kit and supplies KIT Dispense based on patient and insurance preference. Use up to four times daily as directed. (FOR ICD-9 250.00, 250.01). 1 each 0  . CALCIUM-VITAMIN D PO Take 600 Units by mouth 2 (two) times daily.      . cholecalciferol (VITAMIN D-400) 400 UNITS TABS Take 400 Units by mouth 2 (two) times daily.     . Coenzyme Q10 100 MG TABS Take 100 mg by mouth daily.    Marland Kitchen etodolac (LODINE) 400 MG tablet Take 1 tablet (400 mg total) by mouth 2 (two) times daily as needed. 60 tablet 3  . Flaxseed, Linseed, (FLAXSEED OIL PO) Take by mouth.    . furosemide (LASIX) 40 MG tablet Take 1 tablet (40 mg total) by mouth daily. 90 tablet 2  . metFORMIN (GLUCOPHAGE) 500 MG tablet  Take 1 tablet (500 mg total) by mouth 2 (two) times daily with a meal. 180 tablet 2  . NONFORMULARY OR COMPOUNDED ITEM Td   #1    0.5 ml IM x1 1 each 0  . Omega-3 Fatty Acids (FISH OIL PO) Take by mouth.    Marland Kitchen POTASSIUM CHLORIDE PO Take 1 tablet by mouth as needed.    . rosuvastatin (CRESTOR) 10 MG tablet Take 1 tablet (10 mg total) by mouth daily. 90 tablet 2  . tiZANidine (ZANAFLEX) 2 MG tablet Take 1-2 tablets (2-4 mg total) by mouth at bedtime as needed for muscle spasms. 60 tablet 1  . triamcinolone cream (KENALOG) 0.1 % Apply 1 application topically 2 (two) times daily.  45 g 2  . TRUE METRIX BLOOD GLUCOSE TEST test strip TEST  UP  TO FOUR TIMES DAILY AS DIRECTED 200 each 12  . TRUEPLUS LANCETS 33G MISC TEST  UP TO FOUR TIMES DAILY AS DIRECTED 200 each 12  . Vitamin D, Ergocalciferol, (DRISDOL) 1.25 MG (50000 UT) CAPS capsule Take 1 capsule (50,000 Units total) by mouth every 7 (seven) days. 4 capsule 0  . Zoster Vaccine Adjuvanted [facility]) injection 0.34m IM x1  Repeat in 2-6 months 0.5 mL 1  . [DISCONTINUED] omega-3 acid ethyl esters (LOVAZA) 1 G capsule Take 1 g by mouth 3 (three) times daily.      . [DISCONTINUED] pravastatin (PRAVACHOL) 20 MG tablet Take 20 mg by mouth daily.       Current Facility-Administered Medications on File Prior to Visit  Medication Dose Route Frequency Provider Last Rate Last Dose  . 0.9 %  sodium chloride infusion  500 mL Intravenous Once Nandigam, Kavitha V, MD      . methylPREDNISolone acetate (DEPO-MEDROL) injection 40 mg  40 mg Intra-articular Once Hilts, Michael, MD         Objective:  Objective  Physical Exam Vitals signs and nursing note reviewed. Exam conducted with a chaperone present.  Constitutional:      Appearance: She is well-developed.  HENT:     Head: Normocephalic and atraumatic.  Eyes:     Conjunctiva/sclera: Conjunctivae normal.  Neck:     Musculoskeletal: Normal range of motion and neck supple.     Thyroid: No thyromegaly.     Vascular: No carotid bruit or JVD.  Cardiovascular:     Rate and Rhythm: Normal rate and regular rhythm.     Heart sounds: Normal heart sounds. No murmur.  Pulmonary:     Effort: Pulmonary effort is normal. No respiratory distress.     Breath sounds: Normal breath sounds. No wheezing or rales.  Chest:     Chest wall: No tenderness.  Abdominal:     Palpations: Abdomen is soft.     Tenderness: There is no abdominal tenderness.  Genitourinary:    Rectum: Guaiac result negative. External hemorrhoid present. No mass or tenderness.  Neurological:     Mental Status: She is  alert and oriented to person, place, and time.    BP 122/60 (BP Location: Right Arm, Patient Position: Sitting, Cuff Size: Large)   Pulse 66   Temp 97.6 F (36.4 C) (Temporal)   Resp 18   Ht '5\' 2"'  (1.575 m)   Wt 202 lb 9.6 oz (91.9 kg)   SpO2 97%   BMI 37.06 kg/m  Wt Readings from Last 3 Encounters:  11/27/18 202 lb 9.6 oz (91.9 kg)  11/06/18 201 lb 6.4 oz (91.4 kg)  06/27/18 193 lb (87.5  kg)     Lab Results  Component Value Date   WBC 9.1 11/27/2018   HGB 13.8 11/27/2018   HCT 41.7 11/27/2018   PLT 288.0 11/27/2018   GLUCOSE 93 11/27/2018   CHOL 189 11/06/2018   TRIG 114.0 11/06/2018   HDL 105.60 11/06/2018   LDLDIRECT 95.9 10/06/2012   LDLCALC 61 11/06/2018   ALT 18 11/27/2018   AST 13 11/27/2018   NA 136 11/27/2018   K 4.4 11/27/2018   CL 100 11/27/2018   CREATININE 0.88 11/27/2018   BUN 24 (H) 11/27/2018   CO2 28 11/27/2018   TSH 1.250 05/27/2017   INR 1.03 04/20/2009   HGBA1C 6.8 (H) 11/06/2018   MICROALBUR 2.1 (H) 11/06/2018    Dg Chest 2 View  Result Date: 01/29/2018 CLINICAL DATA:  Cough. EXAM: CHEST - 2 VIEW COMPARISON:  04/08/2017 FINDINGS: The heart size and mediastinal contours are within normal limits. Both lungs are clear. a stable right upper lobe scar. The visualized skeletal structures are unremarkable. IMPRESSION: No active cardiopulmonary disease. Electronically Signed   By: Kerby Moors M.D.   On: 01/29/2018 17:33              Assessment & Plan:  Plan  I am having Ashyla B. Doublin start on hydrocortisone. I am also having her maintain her cholecalciferol, CALCIUM-VITAMIN D PO, aspirin EC, Coenzyme Q10, (Flaxseed, Linseed, (FLAXSEED OIL PO)), Omega-3 Fatty Acids (FISH OIL PO), blood glucose meter kit and supplies, POTASSIUM CHLORIDE PO, True Metrix Blood Glucose Test, TRUEplus Lancets 33G, Vitamin D (Ergocalciferol), etodolac, tiZANidine, rosuvastatin, furosemide, metFORMIN, Praluent, triamcinolone cream, Shingrix, and NONFORMULARY  OR COMPOUNDED ITEM. We will continue to administer sodium chloride and methylPREDNISolone acetate.  Meds ordered this encounter  Medications  . hydrocortisone (ANUSOL-HC) 2.5 % rectal cream    Sig: Place 1 application rectally 2 (two) times daily.    Dispense:  30 g    Refill:  0    Problem List Items Addressed This Visit    None    Visit Diagnoses    Hemorrhoids, unspecified hemorrhoid type    -  Primary   Relevant Medications   hydrocortisone (ANUSOL-HC) 2.5 % rectal cream   Other Relevant Orders   Ambulatory referral to Gastroenterology   CBC with Differential/Platelet (Completed)   Comprehensive metabolic panel (Completed)   Rectal bleeding       Relevant Orders   Ambulatory referral to Gastroenterology   CBC with Differential/Platelet (Completed)   Comprehensive metabolic panel (Completed)      Follow-up: Return if symptoms worsen or fail to improve.  Ann Held, DO

## 2018-11-27 NOTE — Patient Instructions (Signed)

## 2018-11-28 LAB — CBC WITH DIFFERENTIAL/PLATELET
Basophils Absolute: 0.2 10*3/uL — ABNORMAL HIGH (ref 0.0–0.1)
Basophils Relative: 1.9 % (ref 0.0–3.0)
Eosinophils Absolute: 0.1 10*3/uL (ref 0.0–0.7)
Eosinophils Relative: 0.7 % (ref 0.0–5.0)
HCT: 41.7 % (ref 36.0–46.0)
Hemoglobin: 13.8 g/dL (ref 12.0–15.0)
Lymphocytes Relative: 25.8 % (ref 12.0–46.0)
Lymphs Abs: 2.3 10*3/uL (ref 0.7–4.0)
MCHC: 33.2 g/dL (ref 30.0–36.0)
MCV: 95.3 fl (ref 78.0–100.0)
Monocytes Absolute: 0.7 10*3/uL (ref 0.1–1.0)
Monocytes Relative: 8 % (ref 3.0–12.0)
Neutro Abs: 5.8 10*3/uL (ref 1.4–7.7)
Neutrophils Relative %: 63.6 % (ref 43.0–77.0)
Platelets: 288 10*3/uL (ref 150.0–400.0)
RBC: 4.37 Mil/uL (ref 3.87–5.11)
RDW: 14.4 % (ref 11.5–15.5)
WBC: 9.1 10*3/uL (ref 4.0–10.5)

## 2018-11-28 LAB — COMPREHENSIVE METABOLIC PANEL
ALT: 18 U/L (ref 0–35)
AST: 13 U/L (ref 0–37)
Albumin: 4.1 g/dL (ref 3.5–5.2)
Alkaline Phosphatase: 74 U/L (ref 39–117)
BUN: 24 mg/dL — ABNORMAL HIGH (ref 6–23)
CO2: 28 mEq/L (ref 19–32)
Calcium: 9.8 mg/dL (ref 8.4–10.5)
Chloride: 100 mEq/L (ref 96–112)
Creatinine, Ser: 0.88 mg/dL (ref 0.40–1.20)
GFR: 75.36 mL/min (ref >60.00)
Glucose, Bld: 93 mg/dL (ref 70–99)
Potassium: 4.4 mEq/L (ref 3.5–5.1)
Sodium: 136 mEq/L (ref 135–145)
Total Bilirubin: 1 mg/dL (ref 0.2–1.2)
Total Protein: 6.7 g/dL (ref 6.0–8.3)

## 2018-12-17 DIAGNOSIS — G4733 Obstructive sleep apnea (adult) (pediatric): Secondary | ICD-10-CM | POA: Diagnosis not present

## 2019-01-02 ENCOUNTER — Encounter: Payer: Self-pay | Admitting: Gastroenterology

## 2019-01-02 ENCOUNTER — Other Ambulatory Visit: Payer: Self-pay

## 2019-01-02 ENCOUNTER — Ambulatory Visit (INDEPENDENT_AMBULATORY_CARE_PROVIDER_SITE_OTHER): Payer: Medicare Other | Admitting: Gastroenterology

## 2019-01-02 VITALS — BP 122/70 | HR 80 | Temp 97.2°F | Ht 62.0 in | Wt 204.6 lb

## 2019-01-02 DIAGNOSIS — K625 Hemorrhage of anus and rectum: Secondary | ICD-10-CM

## 2019-01-02 DIAGNOSIS — K641 Second degree hemorrhoids: Secondary | ICD-10-CM

## 2019-01-02 MED ORDER — HYDROCORTISONE ACETATE 25 MG RE SUPP: 25.0000 mg | Freq: Every evening | RECTAL | 1 refills | Status: DC | PRN

## 2019-01-02 NOTE — Progress Notes (Signed)
Sierra Ford    [id number]    1941/02/15  Primary Care Physician:Lowne Cheri Rous Alferd Apa, DO  Referring Physician: Carollee Herter, Alferd Apa, DO [address] [place],  [place] [address]   Chief complaint: Bright red blood per rectum HPI: 78 year old very pleasant African-American female with history of CAD, diastolic heart failure, OSA, hypertension, diabetes here for follow-up visit for evaluation of blood per rectum.  She had 2 episodes of small volume BRBPR when she wiped after bowel movement about 1-2 months ago.  She saw Dr. Etter Sjogren, had labs.  Normal CBC.  She was prescribed hydrocortisone for hemorrhoids.  She has not had any further episodes of bleeding.  Denies any change in bowel habits.  She has soft bowel movements daily but she tends to sit on commode for prolonged period of time, sometimes drinks coffee and also reads paper while she is on the commode.  She has also been sitting and watching television for prolonged period of time in the past few months. Denies any weight loss, decreased appetite, nausea, vomiting or abdominal pain.  Colonoscopy 07/17/2017: Left side diverticulosis and medium size internal hemorrhoids.   Outpatient Encounter Medications as of 01/02/2019  Medication Sig  . Alirocumab (PRALUENT) 75 MG/ML SOAJ Inject 1 pen into the skin every 14 (fourteen) days.  Marland Kitchen aspirin EC 81 MG tablet Take 81 mg by mouth at bedtime.    . blood glucose meter kit and supplies KIT Dispense based on patient and insurance preference. Use up to four times daily as directed. (FOR ICD-9 250.00, 250.01).  . CALCIUM-VITAMIN D PO Take 600 Units by mouth 2 (two) times daily.    . cholecalciferol (VITAMIN D-400) 400 UNITS TABS Take 400 Units by mouth 2 (two) times daily.   . Coenzyme Q10 100 MG TABS Take 100 mg by mouth daily.  Marland Kitchen etodolac (LODINE) 400 MG tablet Take 1 tablet (400 mg total) by mouth 2 (two) times daily as needed.  . Flaxseed, Linseed, (FLAXSEED  OIL PO) Take by mouth.  . furosemide (LASIX) 40 MG tablet Take 1 tablet (40 mg total) by mouth daily.  . hydrocortisone (ANUSOL-HC) 2.5 % rectal cream Place 1 application rectally 2 (two) times daily.  . metFORMIN (GLUCOPHAGE) 500 MG tablet Take 1 tablet (500 mg total) by mouth 2 (two) times daily with a meal.  . Omega-3 Fatty Acids (FISH OIL PO) Take by mouth.  Marland Kitchen POTASSIUM CHLORIDE PO Take 1 tablet by mouth as needed.  . rosuvastatin (CRESTOR) 10 MG tablet Take 1 tablet (10 mg total) by mouth daily.  Marland Kitchen tiZANidine (ZANAFLEX) 2 MG tablet Take 1-2 tablets (2-4 mg total) by mouth at bedtime as needed for muscle spasms.  . TRUE METRIX BLOOD GLUCOSE TEST test strip TEST  UP  TO FOUR TIMES DAILY AS DIRECTED  . TRUEPLUS LANCETS 33G MISC TEST  UP TO FOUR TIMES DAILY AS DIRECTED  . Zoster Vaccine Adjuvanted [facility]) injection 0.77m IM x1  Repeat in 2-6 months  . [DISCONTINUED] NONFORMULARY OR COMPOUNDED ITEM Td   #1    0.5 ml IM x1  . [DISCONTINUED] omega-3 acid ethyl esters (LOVAZA) 1 G capsule Take 1 g by mouth 3 (three) times daily.    . [DISCONTINUED] pravastatin (PRAVACHOL) 20 MG tablet Take 20 mg by mouth daily.    . [DISCONTINUED] triamcinolone cream (KENALOG) 0.1 % Apply 1 application topically 2 (two)  times daily.  . [DISCONTINUED] Vitamin D, Ergocalciferol, (DRISDOL) 1.25 MG (50000 UT) CAPS capsule Take 1 capsule (50,000 Units total) by mouth every 7 (seven) days.   Facility-Administered Encounter Medications as of 01/02/2019  Medication  . 0.9 %  sodium chloride infusion  . methylPREDNISolone acetate (DEPO-MEDROL) injection 40 mg    Allergies as of 01/02/2019  . (No Known Allergies)    Past Medical History:  Diagnosis Date  . Anxiety   . Back pain   . Chest pain    CLite with apical ischemia in 2006 - normal coronary arteries by [facility] in 12/2004;  Myoview 11/12:  Low risk stress nuclear study with a small, partially reversible apical defect most likely related to apical thinning;  cannot R/O very mild apical ischemia.  EF: 75%   . Decreased hearing   . Depression   . Diabetes ([place])   . DVT (deep venous thrombosis) ([place]) 2006   hx of  . Ectopic pregnancy with intrauterine pregnancy   . Hiatal hernia   . Hyperlipidemia   . PE (pulmonary embolism) 2006   hx of  . Sinus congestion   . Sleep apnea    CPAP     Past Surgical History:  Procedure Laterality Date  . ABDOMINAL EXPLORATION SURGERY    . BUNIONECTOMY     right  . COLONOSCOPY  2009  . ECTOPIC PREGNANCY SURGERY    . FOOT SURGERY    . TUBAL LIGATION      Family History  Problem Relation Age of Onset  . Heart failure Mother        died from  . Colon polyps Mother   . Hypertension Mother   . Sudden death Mother   . Obesity Mother   . Alcoholism Father   . Diabetes Maternal Museum/gallery exhibitions officer  . Diabetes Maternal Aunt   . Colon cancer Neg Hx   . Esophageal cancer Neg Hx   . Stomach cancer Neg Hx     Social History   Socioeconomic History  . Marital status: Divorced    Spouse name: Not on file  . Number of children: Not on file  . Years of education: Not on file  . Highest education level: Not on file  Occupational History  . Occupation: Retired Scientist, research (medical)  Social Needs  . Financial resource strain: Not on file  . Food insecurity    Worry: Not on file    Inability: Not on file  . Transportation needs    Medical: Not on file    Non-medical: Not on file  Tobacco Use  . Smoking status: Former Smoker    Packs/day: 1.00    Years: 50.00    Pack years: 50.00    Types: Cigarettes    Quit date: 11/21/2009    Years since quitting: 9.1  . Smokeless tobacco: Never Used  Substance and Sexual Activity  . Alcohol use: Yes    Comment: rare 3 times a year per pt  . Drug use: No  . Sexual activity: Not Currently    Partners: Male  Lifestyle  . Physical activity    Days per week: Not on file    Minutes per session: Not on file  . Stress: Not on file  Relationships  . Social  Herbalist on phone: Not on file    Gets together: Not on file    Attends religious service: Not on file    Active  member of club or organization: Not on file    Attends meetings of clubs or organizations: Not on file    Relationship status: Not on file  . Intimate partner violence    Fear of current or ex partner: Not on file    Emotionally abused: Not on file    Physically abused: Not on file    Forced sexual activity: Not on file  Other Topics Concern  . Not on file  Social History Narrative   Regular exercise            Review of systems: Review of Systems  Constitutional: Negative for fever and chills.  HENT: Negative.   Eyes: Negative for blurred vision.  Respiratory: Negative for cough, shortness of breath and wheezing.   Cardiovascular: Negative for chest pain and palpitations.  Gastrointestinal: as per HPI Genitourinary: Negative for dysuria, urgency, frequency and hematuria.  Musculoskeletal: Positive for myalgias, back pain and joint pain.  Skin: Negative for itching and rash.  Neurological: Negative for dizziness, tremors, focal weakness, seizures and loss of consciousness.  Endo/Heme/Allergies: Positive for easy bruising.  Psychiatric/Behavioral: Negative for depression, suicidal ideas and hallucinations.  All other systems reviewed and are negative.   Physical Exam: Vitals:   01/02/19 0945  BP: 122/70  Pulse: 80  Temp: (!) 97.2 F (36.2 C)   Body mass index is 37.42 kg/m. Gen:      No acute distress HEENT:  EOMI, sclera anicteric Neck:     No masses; no thyromegaly Lungs:    Clear to auscultation bilaterally; normal respiratory effort CV:         Regular rate and rhythm; no murmurs Abd:      + bowel sounds; soft, non-tender; no palpable masses, no distension Ext:    No edema; adequate peripheral perfusion Skin:      Warm and dry; no rash Neuro: alert and oriented x 3 Psych: normal mood and affect  Data Reviewed:  Reviewed labs,  radiology imaging, old records and pertinent past GI work up   Assessment and Plan/Recommendations:  78 year old very pleasant African-American female with type 2 diabetes, hypertension, hyperlipidemia, obesity, CAD on low-dose aspirin with small-volume rectal bleeding likely secondary to internal hemorrhoids  She is up-to-date with colorectal cancer screening Hemoglobin and hematocrit within normal limits  Advised patient to avoid sitting on commode for greater than 3 minutes or sitting without change in position for prolonged period of time in front of television.  Benefiber 1 tablespoon twice daily  Anusol suppository per rectum at bedtime as needed  If continues to have persistent symptoms from hemorrhoids, will consider internal hemorrhoidal band ligation   25 minutes was spent face-to-face with the patient. Greater than 50% of the time used for counseling as well as treatment plan and follow-up. She had multiple questions which were answered to her satisfaction  K. Denzil Magnuson , MD    CC: Carollee Herter, Alferd Apa, *

## 2019-01-02 NOTE — Patient Instructions (Addendum)
AVOID  Sitting on the commode for greater than 3 minutes   Take Benefiber 1 tablespoon twice a day  Take Anusol suppositories at bedtime as needed  Follow up as needed  I appreciate the  opportunity to care for you  Thank You   Harl Bowie , MD   If you are age 77 or older, your body mass index should be between 23-30. Your Body mass index is 37.42 kg/m. If this is out of the aforementioned range listed, please consider follow up with your Primary Care Provider.  If you are age 43 or younger, your body mass index should be between 19-25. Your Body mass index is 37.42 kg/m. If this is out of the aformentioned range listed, please consider follow up with your Primary Care Provider.

## 2019-01-12 ENCOUNTER — Other Ambulatory Visit: Payer: Self-pay

## 2019-01-12 NOTE — Patient Outreach (Signed)
[facility]) Care Management  01/12/2019  GUENEVERE RITTEL 1940/05/04 [id number]   Medication Adherence call to Mrs. [place] Compliant Voice message left with a call back number. Mrs. Macphail is showing past due on Rosuvastatin 10 mg under [place].   [facility] Management Direct Dial [phone]  Fax [phone] [email or web address]

## 2019-01-26 ENCOUNTER — Telehealth: Payer: Self-pay | Admitting: Cardiology

## 2019-01-26 DIAGNOSIS — Z7189 Other specified counseling: Secondary | ICD-10-CM | POA: Insufficient documentation

## 2019-01-26 NOTE — Progress Notes (Deleted)
Cardiology Office Note   Date:  01/26/2019   ID:  Sierra Ford, DOB January 06, 1941, MRN [id number]  PCP:  Carollee Herter, Alferd Apa, DO  Cardiologist:   Minus Breeding, MD Referring:  ***  No chief complaint on file.     History of Present Illness: Sierra Ford is a 78 y.o. female who presents for follow up of diastolic dysfunction and abnormal echo. Echo 04/16/17  demonstrated an EF of 65%I saw her last year and she returns for follow up. She called recently and despite losing 7 lbs she has had increased SOB.  ***    Since I last saw her she has had no new complaints.  She has gained about 15 lbs from eating too much.  She reports that she has chronic dyspnea with exertion. However, this is not really different than previous.  The patient denies any new symptoms such as chest discomfort, neck or arm discomfort. There has been no new PND or orthopnea. There have been no reported palpitations, presyncope or syncope.   She is not having cough fevers or chills.  She drinks a great deal of water.      Past Medical History:  Diagnosis Date  . Anxiety   . Back pain   . Chest pain    CLite with apical ischemia in 2006 - normal coronary arteries by [facility] in 12/2004;  Myoview 11/12:  Low risk stress nuclear study with a small, partially reversible apical defect most likely related to apical thinning; cannot R/O very mild apical ischemia.  EF: 75%   . Decreased hearing   . Depression   . Diabetes ([place])   . DVT (deep venous thrombosis) ([place]) 2006   hx of  . Ectopic pregnancy with intrauterine pregnancy   . Hiatal hernia   . Hyperlipidemia   . PE (pulmonary embolism) 2006   hx of  . Sinus congestion   . Sleep apnea    CPAP     Past Surgical History:  Procedure Laterality Date  . ABDOMINAL EXPLORATION SURGERY    . BUNIONECTOMY     right  . COLONOSCOPY  2009  . ECTOPIC PREGNANCY SURGERY    . FOOT SURGERY    . TUBAL LIGATION       Current Outpatient Medications   Medication Sig Dispense Refill  . Alirocumab (PRALUENT) 75 MG/ML SOAJ Inject 1 pen into the skin every 14 (fourteen) days. 2 pen 11  . aspirin EC 81 MG tablet Take 81 mg by mouth at bedtime.      . blood glucose meter kit and supplies KIT Dispense based on patient and insurance preference. Use up to four times daily as directed. (FOR ICD-9 250.00, 250.01). 1 each 0  . CALCIUM-VITAMIN D PO Take 600 Units by mouth 2 (two) times daily.      . cholecalciferol (VITAMIN D-400) 400 UNITS TABS Take 400 Units by mouth 2 (two) times daily.     . Coenzyme Q10 100 MG TABS Take 100 mg by mouth daily.    Marland Kitchen etodolac (LODINE) 400 MG tablet Take 1 tablet (400 mg total) by mouth 2 (two) times daily as needed. 60 tablet 3  . Flaxseed, Linseed, (FLAXSEED OIL PO) Take by mouth.    . furosemide (LASIX) 40 MG tablet Take 1 tablet (40 mg total) by mouth daily. 90 tablet 2  . hydrocortisone (ANUSOL-HC) 2.5 % rectal cream Place 1 application rectally 2 (two) times daily. 30 g 0  . hydrocortisone (ANUSOL-HC)  25 MG suppository Place 1 suppository (25 mg total) rectally at bedtime as needed for hemorrhoids or anal itching. 12 suppository 1  . metFORMIN (GLUCOPHAGE) 500 MG tablet Take 1 tablet (500 mg total) by mouth 2 (two) times daily with a meal. 180 tablet 2  . Omega-3 Fatty Acids (FISH OIL PO) Take by mouth.    Marland Kitchen POTASSIUM CHLORIDE PO Take 1 tablet by mouth as needed.    . rosuvastatin (CRESTOR) 10 MG tablet Take 1 tablet (10 mg total) by mouth daily. 90 tablet 2  . tiZANidine (ZANAFLEX) 2 MG tablet Take 1-2 tablets (2-4 mg total) by mouth at bedtime as needed for muscle spasms. 60 tablet 1  . TRUE METRIX BLOOD GLUCOSE TEST test strip TEST  UP  TO FOUR TIMES DAILY AS DIRECTED 200 each 12  . TRUEPLUS LANCETS 33G MISC TEST  UP TO FOUR TIMES DAILY AS DIRECTED 200 each 12  . Zoster Vaccine Adjuvanted [facility]) injection 0.60m IM x1  Repeat in 2-6 months 0.5 mL 1   Current Facility-Administered Medications  Medication  Dose Route Frequency Provider Last Rate Last Dose  . 0.9 %  sodium chloride infusion  500 mL Intravenous Once Nandigam, Kavitha V, MD      . methylPREDNISolone acetate (DEPO-MEDROL) injection 40 mg  40 mg Intra-articular Once Hilts, Michael, MD        Allergies:   Patient has no known allergies.     ROS:  Please see the history of present illness.   Otherwise, review of systems are positive for {NONE DEFAULTED:18576::"none"}.   All other systems are reviewed and negative.    PHYSICAL EXAM: VS:  There were no vitals taken for this visit. , BMI There is no height or weight on file to calculate BMI. GENERAL:  Well appearing NECK:  No jugular venous distention, waveform within normal limits, carotid upstroke brisk and symmetric, no bruits, no thyromegaly LUNGS:  Clear to auscultation bilaterally CHEST:  Unremarkable HEART:  PMI not displaced or sustained,S1 and S2 within normal limits, no S3, no S4, no clicks, no rubs, *** murmurs ABD:  Flat, positive bowel sounds normal in frequency in pitch, no bruits, no rebound, no guarding, no midline pulsatile mass, no hepatomegaly, no splenomegaly EXT:  2 plus pulses throughout, no edema, no cyanosis no clubbing   EKG:  EKG {ACTION; IS/IS NFFM:[id number]}ordered today. The ekg ordered today demonstrates ***   Recent Labs: 03/04/2018: BNP 37.6 11/27/2018: ALT 18; BUN 24; Creatinine, Ser 0.88; Hemoglobin 13.8; Platelets 288.0; Potassium 4.4; Sodium 136    Lipid Panel    Component Value Date/Time   CHOL 189 11/06/2018 0936   CHOL 209 (H) 08/26/2018 1018   TRIG 114.0 11/06/2018 0936   HDL 105.60 11/06/2018 0936   HDL 101 08/26/2018 1018   CHOLHDL 2 11/06/2018 0936   VLDL 22.8 11/06/2018 0936   LDLCALC 61 11/06/2018 0936   LDLCALC 88 08/26/2018 1018   LDLDIRECT 95.9 10/06/2012 1248      Wt Readings from Last 3 Encounters:  01/02/19 204 lb 9.6 oz (92.8 kg)  11/27/18 202 lb 9.6 oz (91.9 kg)  11/06/18 201 lb 6.4 oz (91.4 kg)      Other  studies Reviewed: Additional studies/ records that were reviewed today include: ***. Review of the above records demonstrates:  Please see elsewhere in the note.  ***   ASSESSMENT AND PLAN:  DIASTOLIC HF:  ***  I had a long discussion with her about this.  She was very confused  about the diagnosis of HF.  I explained this and that we treat this by reducing her fluid intake and managing salt.  I did see her labs and her creat was 0.89.      DM:A1c was ***  6.7.  Continue current therapy.   SLEEP APNEA: ***  She wears her CPAP.    DYSLIPIDEMIA:   ***   She is being followed in the [facility].   LDL was 90.  Continue current therapy.  COVID EDUCATION:  ***    Current medicines are reviewed at length with the patient today.  The patient {ACTIONS; HAS/DOES NOT HAVE:19233} concerns regarding medicines.  The following changes have been made:  {PLAN; NO CHANGE:13088:s}  Labs/ tests ordered today include: *** No orders of the defined types were placed in this encounter.    Disposition:   FU with ***    Signed, Minus Breeding, MD  01/26/2019 6:53 PM    [facility]

## 2019-01-26 NOTE — Telephone Encounter (Signed)
New Message   Pt c/o Shortness Of Breath: STAT if SOB developed within the last 24 hours or pt is noticeably SOB on the phone  1. Are you currently SOB (can you hear that pt is SOB on the phone)? Yes, I can hear it on the phone  2. How long have you been experiencing SOB? For about a week  3. Are you SOB when sitting or when up moving around? Moving. She states that when she walks from the bedroom to the living room she is very winded   4. Are you currently experiencing any other symptoms? Some coughing and loss of appetite. She states that she did get tested for covid and it was negative  Alternate number is [phone].

## 2019-01-26 NOTE — Telephone Encounter (Signed)
Spoke to pt - states she has been SOB for 1 week. She is only SOB with exertion. Denied CP, swelling and weight gain. States she actually lost 7 pounds. Denies consuming salty foods. Advised pt to keep appt tomorrow with Dr Percival Spanish. Advised pt to cal 911 if SOB gets worse and does not subside with rest. Also advised to call 911 if had chest pain that does not subside.

## 2019-01-27 ENCOUNTER — Other Ambulatory Visit: Payer: Self-pay

## 2019-01-27 ENCOUNTER — Ambulatory Visit: Payer: Medicare Other | Admitting: Cardiology

## 2019-01-27 ENCOUNTER — Emergency Department (HOSPITAL_COMMUNITY): Payer: Medicare Other

## 2019-01-27 ENCOUNTER — Inpatient Hospital Stay (HOSPITAL_COMMUNITY)
Admission: EM | Admit: 2019-01-27 | Discharge: 2019-01-31 | DRG: 175 | Disposition: A | Discharge status: Home / Self Care | Payer: Medicare Other | Attending: Internal Medicine | Admitting: Internal Medicine

## 2019-01-27 DIAGNOSIS — Z79899 Other long term (current) drug therapy: Secondary | ICD-10-CM

## 2019-01-27 DIAGNOSIS — I2609 Other pulmonary embolism with acute cor pulmonale: Secondary | ICD-10-CM | POA: Diagnosis not present

## 2019-01-27 DIAGNOSIS — I2699 Other pulmonary embolism without acute cor pulmonale: Secondary | ICD-10-CM | POA: Diagnosis not present

## 2019-01-27 DIAGNOSIS — Z6836 Body mass index (BMI) 36.0-36.9, adult: Secondary | ICD-10-CM | POA: Diagnosis not present

## 2019-01-27 DIAGNOSIS — H9190 Unspecified hearing loss, unspecified ear: Secondary | ICD-10-CM | POA: Diagnosis present

## 2019-01-27 DIAGNOSIS — Z7982 Long term (current) use of aspirin: Secondary | ICD-10-CM

## 2019-01-27 DIAGNOSIS — I110 Hypertensive heart disease with heart failure: Secondary | ICD-10-CM | POA: Diagnosis not present

## 2019-01-27 DIAGNOSIS — E669 Obesity, unspecified: Secondary | ICD-10-CM | POA: Diagnosis present

## 2019-01-27 DIAGNOSIS — N179 Acute kidney failure, unspecified: Secondary | ICD-10-CM | POA: Diagnosis not present

## 2019-01-27 DIAGNOSIS — F419 Anxiety disorder, unspecified: Secondary | ICD-10-CM | POA: Diagnosis present

## 2019-01-27 DIAGNOSIS — Z7984 Long term (current) use of oral hypoglycemic drugs: Secondary | ICD-10-CM

## 2019-01-27 DIAGNOSIS — R0902 Hypoxemia: Secondary | ICD-10-CM

## 2019-01-27 DIAGNOSIS — Z9989 Dependence on other enabling machines and devices: Secondary | ICD-10-CM | POA: Diagnosis not present

## 2019-01-27 DIAGNOSIS — I82431 Acute embolism and thrombosis of right popliteal vein: Secondary | ICD-10-CM | POA: Diagnosis not present

## 2019-01-27 DIAGNOSIS — Z87891 Personal history of nicotine dependence: Secondary | ICD-10-CM

## 2019-01-27 DIAGNOSIS — I82411 Acute embolism and thrombosis of right femoral vein: Secondary | ICD-10-CM | POA: Diagnosis not present

## 2019-01-27 DIAGNOSIS — R42 Dizziness and giddiness: Secondary | ICD-10-CM | POA: Diagnosis not present

## 2019-01-27 DIAGNOSIS — I10 Essential (primary) hypertension: Secondary | ICD-10-CM | POA: Diagnosis present

## 2019-01-27 DIAGNOSIS — E119 Type 2 diabetes mellitus without complications: Secondary | ICD-10-CM | POA: Diagnosis present

## 2019-01-27 DIAGNOSIS — I371 Nonrheumatic pulmonary valve insufficiency: Secondary | ICD-10-CM | POA: Diagnosis not present

## 2019-01-27 DIAGNOSIS — Z833 Family history of diabetes mellitus: Secondary | ICD-10-CM

## 2019-01-27 DIAGNOSIS — G4733 Obstructive sleep apnea (adult) (pediatric): Secondary | ICD-10-CM | POA: Diagnosis present

## 2019-01-27 DIAGNOSIS — R0600 Dyspnea, unspecified: Secondary | ICD-10-CM

## 2019-01-27 DIAGNOSIS — Z8249 Family history of ischemic heart disease and other diseases of the circulatory system: Secondary | ICD-10-CM | POA: Diagnosis not present

## 2019-01-27 DIAGNOSIS — Z86718 Personal history of other venous thrombosis and embolism: Secondary | ICD-10-CM

## 2019-01-27 DIAGNOSIS — I5032 Chronic diastolic (congestive) heart failure: Secondary | ICD-10-CM | POA: Diagnosis present

## 2019-01-27 DIAGNOSIS — Z8371 Family history of colonic polyps: Secondary | ICD-10-CM | POA: Diagnosis not present

## 2019-01-27 DIAGNOSIS — E1165 Type 2 diabetes mellitus with hyperglycemia: Secondary | ICD-10-CM | POA: Diagnosis not present

## 2019-01-27 DIAGNOSIS — J9601 Acute respiratory failure with hypoxia: Secondary | ICD-10-CM | POA: Diagnosis not present

## 2019-01-27 DIAGNOSIS — F329 Major depressive disorder, single episode, unspecified: Secondary | ICD-10-CM | POA: Diagnosis present

## 2019-01-27 DIAGNOSIS — E1151 Type 2 diabetes mellitus with diabetic peripheral angiopathy without gangrene: Secondary | ICD-10-CM | POA: Diagnosis present

## 2019-01-27 DIAGNOSIS — I2602 Saddle embolus of pulmonary artery with acute cor pulmonale: Secondary | ICD-10-CM | POA: Diagnosis not present

## 2019-01-27 DIAGNOSIS — I361 Nonrheumatic tricuspid (valve) insufficiency: Secondary | ICD-10-CM | POA: Diagnosis not present

## 2019-01-27 DIAGNOSIS — Z811 Family history of alcohol abuse and dependence: Secondary | ICD-10-CM | POA: Diagnosis not present

## 2019-01-27 DIAGNOSIS — Z20828 Contact with and (suspected) exposure to other viral communicable diseases: Secondary | ICD-10-CM | POA: Diagnosis present

## 2019-01-27 DIAGNOSIS — E785 Hyperlipidemia, unspecified: Secondary | ICD-10-CM | POA: Diagnosis present

## 2019-01-27 DIAGNOSIS — I82441 Acute embolism and thrombosis of right tibial vein: Secondary | ICD-10-CM | POA: Diagnosis present

## 2019-01-27 DIAGNOSIS — R0602 Shortness of breath: Secondary | ICD-10-CM | POA: Diagnosis not present

## 2019-01-27 DIAGNOSIS — Z743 Need for continuous supervision: Secondary | ICD-10-CM | POA: Diagnosis not present

## 2019-01-27 DIAGNOSIS — I82451 Acute embolism and thrombosis of right peroneal vein: Secondary | ICD-10-CM | POA: Diagnosis present

## 2019-01-27 DIAGNOSIS — R000 Tachycardia, unspecified: Secondary | ICD-10-CM | POA: Diagnosis not present

## 2019-01-27 LAB — BASIC METABOLIC PANEL
Anion gap: 16 — ABNORMAL HIGH (ref 5–15)
BUN: 21 mg/dL (ref 8–23)
CO2: 19 mmol/L — ABNORMAL LOW (ref 22–32)
Calcium: 9.2 mg/dL (ref 8.9–10.3)
Chloride: 104 mmol/L (ref 98–111)
Creatinine, Ser: 1.19 mg/dL — ABNORMAL HIGH (ref 0.44–1.00)
GFR calc Af Amer: 51 mL/min — ABNORMAL LOW (ref >60)
GFR calc non Af Amer: 44 mL/min — ABNORMAL LOW (ref >60)
Glucose, Bld: 323 mg/dL — ABNORMAL HIGH (ref 70–99)
Potassium: 3.9 mmol/L (ref 3.5–5.1)
Sodium: 139 mmol/L (ref 135–145)

## 2019-01-27 LAB — CBG MONITORING, ED: Glucose-Capillary: 135 mg/dL — ABNORMAL HIGH (ref 70–99)

## 2019-01-27 LAB — CBC
HCT: 46 % (ref 36.0–46.0)
Hemoglobin: 14.6 g/dL (ref 12.0–15.0)
MCH: 31.1 pg (ref 26.0–34.0)
MCHC: 31.7 g/dL (ref 30.0–36.0)
MCV: 97.9 fL (ref 80.0–100.0)
Platelets: 186 10*3/uL (ref 150–400)
RBC: 4.7 MIL/uL (ref 3.87–5.11)
RDW: 14.5 % (ref 11.5–15.5)
WBC: 7.2 10*3/uL (ref 4.0–10.5)
nRBC: 0 % (ref 0.0–0.2)

## 2019-01-27 LAB — TROPONIN I (HIGH SENSITIVITY)
Troponin I (High Sensitivity): 76 ng/L — ABNORMAL HIGH (ref <18)
Troponin I (High Sensitivity): 208 ng/L (ref <18)

## 2019-01-27 LAB — SARS CORONAVIRUS 2 (TAT 6-24 HRS): SARS Coronavirus 2: NEGATIVE

## 2019-01-27 MED ORDER — SODIUM CHLORIDE 0.9% FLUSH: 3.0000 mL | Freq: Once | INTRAVENOUS | Status: DC

## 2019-01-27 MED ORDER — INSULIN ASPART 100 UNIT/ML ~~LOC~~ SOLN
0.0000 [IU] | Freq: Three times a day (TID) | SUBCUTANEOUS | Status: DC
  Administered 2019-01-28: 2 [IU] via SUBCUTANEOUS
  Administered 2019-01-28: 13:00:00 3 [IU] via SUBCUTANEOUS

## 2019-01-27 MED ORDER — HEPARIN (PORCINE) 25000 UT/250ML-% IV SOLN
1250.0000 [IU]/h | INTRAVENOUS | Status: DC
  Administered 2019-01-27: 1250 [IU]/h via INTRAVENOUS
  Filled 2019-01-27: qty 250

## 2019-01-27 MED ORDER — INSULIN ASPART 100 UNIT/ML ~~LOC~~ SOLN: 0.0000 [IU] | Freq: Every day | SUBCUTANEOUS | Status: DC

## 2019-01-27 MED ORDER — ROSUVASTATIN CALCIUM 5 MG PO TABS
10.0000 mg | ORAL_TABLET | Freq: Every day | ORAL | Status: DC
  Administered 2019-01-27: 21:00:00 10 mg via ORAL
  Filled 2019-01-27 (×2): qty 2

## 2019-01-27 MED ORDER — HEPARIN BOLUS VIA INFUSION
5000.0000 [IU] | Freq: Once | INTRAVENOUS | Status: AC
  Administered 2019-01-27: 17:00:00 5000 [IU] via INTRAVENOUS
  Filled 2019-01-27: qty 5000

## 2019-01-27 MED ORDER — IOHEXOL 350 MG/ML SOLN
100.0000 mL | Freq: Once | INTRAVENOUS | Status: AC | PRN
  Administered 2019-01-27: 15:00:00 100 mL via INTRAVENOUS

## 2019-01-27 MED ORDER — COENZYME Q10 100 MG PO TABS: 100.0000 mg | ORAL_TABLET | Freq: Every day | ORAL | Status: DC

## 2019-01-27 MED ORDER — FUROSEMIDE 40 MG PO TABS
40.0000 mg | ORAL_TABLET | Freq: Every day | ORAL | Status: DC
  Administered 2019-01-27 – 2019-01-28 (×2): 40 mg via ORAL
  Filled 2019-01-27 (×2): qty 2

## 2019-01-27 NOTE — ED Triage Notes (Signed)
Pt arrives via EMS from home with SOB since Saturday. Denies recent fevers. Taking meds for CHF. Alert, oriented x4. VSS. Pt not normally on O2 today requires 4L.

## 2019-01-27 NOTE — ED Notes (Signed)
Got patient undress on the monitor patient is resting with call bell in reach 

## 2019-01-27 NOTE — ED Notes (Signed)
Pt given warm blankets.

## 2019-01-27 NOTE — ED Provider Notes (Signed)
[facility] EMERGENCY DEPARTMENT Provider Note   CSN: [id number] Arrival date & time: 01/27/19  1133     History   Chief Complaint No chief complaint on file.   HPI Sierra Ford is a 78 y.o. female.     HPI  She presents for evaluation of shortness of breath present for 2 weeks, and worsening.  He denies lower extremity swelling or weight gain.  She has in fact lost 7 pounds recently.  She reports decreased appetite.  She denies exposure to COVID-19.  She states she had a COVID-19 test, done 1 week ago and it returned as negative.  She has not taken her temperature at home and this morning had an episode of sweating.  She denies chest pain, productive cough, nausea, vomiting, change in bowel or urinary habits.  There are no other known modifying factors.     Past Medical History:  Diagnosis Date  . Anxiety   . Back pain   . Chest pain    CLite with apical ischemia in 2006 - normal coronary arteries by [facility] in 12/2004;  Myoview 11/12:  Low risk stress nuclear study with a small, partially reversible apical defect most likely related to apical thinning; cannot R/O very mild apical ischemia.  EF: 75%   . Decreased hearing   . Depression   . Diabetes ([place])   . DVT (deep venous thrombosis) ([place]) 2006   hx of  . Ectopic pregnancy with intrauterine pregnancy   . Hiatal hernia   . Hyperlipidemia   . PE (pulmonary embolism) 2006   hx of  . Sinus congestion   . Sleep apnea    CPAP     Patient Active Problem List   Diagnosis Date Noted  . Acute hypoxemic respiratory failure ([place]) 01/27/2019  . Acute pulmonary embolism with acute cor pulmonale ([place]) 01/27/2019  . Educated about COVID-19 virus infection 01/26/2019  . Hyperlipidemia associated with type 2 diabetes mellitus ([place]) 04/24/2018  . Right ankle swelling 05/13/2017  . DM (diabetes mellitus) type II uncontrolled, periph vascular disorder ([place]) 04/08/2017  . Essential hypertension 04/08/2017  .  Atherosclerosis of aorta ([place]) 04/08/2017  . Abnormal auditory perception of both ears 10/01/2016  . Bilateral impacted cerumen 10/01/2016  . OSA (obstructive sleep apnea) 12/16/2015  . DOE (dyspnea on exertion) 06/12/2013  . Obesity (BMI 30-39.9) 04/24/2013  . Chest pain, unspecified 01/22/2011  . Abnormal stress test 01/22/2011  . Frequent urination 01/22/2011  . Chronic diastolic heart failure ([place]) 05/15/2009  . 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  . RASH-NONVESICULAR 04/20/2009  . CERVICAL STRAIN, WITH RADICULOPATHY 03/31/2009  . CAROTID ARTERY DISEASE 01/12/2009  . CERVICAL STRAIN 01/11/2009  . LEG EDEMA, RIGHT 07/27/2008  . HX, URINARY INFECTION 09/19/2006  . Hyperlipidemia LDL goal <70 08/20/2006  . PREGNANCY, ECTOPIC NEC W/INTRAUTERINE PRG 08/20/2006  . FREQUENCY, URINARY 08/20/2006  . Other specified abnormal findings of blood chemistry 08/20/2006  . Personal history of venous thrombosis and embolism 08/20/2006    Past Surgical History:  Procedure Laterality Date  . ABDOMINAL EXPLORATION SURGERY    . BUNIONECTOMY     right  . COLONOSCOPY  2009  . ECTOPIC PREGNANCY SURGERY    . FOOT SURGERY    . TUBAL LIGATION       OB History    Gravida  2   Para  2   Term  2   Preterm      AB      Living  2  SAB      TAB      Ectopic      Multiple      Live Births               Home Medications    Prior to Admission medications   Medication Sig Start Date End Date Taking? Authorizing Provider  Alirocumab (PRALUENT) 75 MG/ML SOAJ Inject 1 pen into the skin every 14 (fourteen) days. 10/23/18  Yes Minus Breeding, MD  aspirin EC 81 MG tablet Take 81 mg by mouth at bedtime.     Yes [provider]  blood glucose meter kit and supplies KIT Dispense based on patient and insurance preference. Use up to four times daily as directed. (FOR ICD-9 250.00, 250.01). 04/08/17  Yes Lowne Lyndal Pulley R, DO  CALCIUM-VITAMIN D PO Take 600 Units by  mouth 2 (two) times daily.     Yes [provider]  cholecalciferol (VITAMIN D-400) 400 UNITS TABS Take 400 Units by mouth 2 (two) times daily.    Yes [provider]  Coenzyme Q10 100 MG TABS Take 100 mg by mouth daily.   Yes [provider]  etodolac (LODINE) 400 MG tablet Take 1 tablet (400 mg total) by mouth 2 (two) times daily as needed. Patient taking differently: Take 400 mg by mouth 2 (two) times daily as needed for mild pain.  02/27/18  Yes Hilts, Legrand Como, MD  Flaxseed, Linseed, (FLAXSEED OIL PO) Take 1,000 mg by mouth daily.    Yes [provider]  furosemide (LASIX) 40 MG tablet Take 1 tablet (40 mg total) by mouth daily. 10/02/18  Yes Minus Breeding, MD  metFORMIN (GLUCOPHAGE) 500 MG tablet Take 1 tablet (500 mg total) by mouth 2 (two) times daily with a meal. 10/02/18  Yes Minus Breeding, MD  Omega-3 Fatty Acids (FISH OIL PO) Take 1,200 mg by mouth daily.    Yes [provider]  POTASSIUM CHLORIDE PO Take 1 tablet by mouth as needed (muscle cramps).    Yes [provider]  rosuvastatin (CRESTOR) 10 MG tablet Take 1 tablet (10 mg total) by mouth daily. 10/02/18  Yes Minus Breeding, MD  TRUE METRIX BLOOD GLUCOSE TEST test strip TEST  UP  TO FOUR TIMES DAILY AS DIRECTED 12/05/17  Yes Carollee Herter, Yvonne R, DO  TRUEPLUS LANCETS 33G MISC TEST  UP TO FOUR TIMES DAILY AS DIRECTED 12/05/17  Yes Lowne Lyndal Pulley R, DO  Wheat Dextrin (BENEFIBER PO) Take 30 mLs by mouth 2 (two) times daily.   Yes [provider]  hydrocortisone (ANUSOL-HC) 2.5 % rectal cream Place 1 application rectally 2 (two) times daily. Patient not taking: Reported on 01/27/2019 11/27/18   Carollee Herter, Alferd Apa, DO  hydrocortisone (ANUSOL-HC) 25 MG suppository Place 1 suppository (25 mg total) rectally at bedtime as needed for hemorrhoids or anal itching. Patient not taking: Reported on 01/27/2019 01/02/19   Mauri Pole, MD  tiZANidine (ZANAFLEX) 2 MG tablet  Take 1-2 tablets (2-4 mg total) by mouth at bedtime as needed for muscle spasms. Patient not taking: Reported on 01/27/2019 02/27/18   Hilts, Legrand Como, MD  Zoster Vaccine Adjuvanted [facility]) injection 0.72m IM x1  Repeat in 2-6 months 11/06/18   LCarollee Herter YAlferd Apa DO  omega-3 acid ethyl esters (LOVAZA) 1 G capsule Take 1 g by mouth 3 (three) times daily.    05/07/11  [provider]  pravastatin (PRAVACHOL) 20 MG tablet Take 20 mg by mouth daily.  05/07/11  [provider]    Family History Family History  Problem Relation Age of Onset  . Heart failure Mother        died from  . Colon polyps Mother   . Hypertension Mother   . Sudden death Mother   . Obesity Mother   . Alcoholism Father   . Diabetes Maternal Museum/gallery exhibitions officer  . Diabetes Maternal Aunt   . Colon cancer Neg Hx   . Esophageal cancer Neg Hx   . Stomach cancer Neg Hx     Social History Social History   Tobacco Use  . Smoking status: Former Smoker    Packs/day: 1.00    Years: 50.00    Pack years: 50.00    Types: Cigarettes    Quit date: 11/21/2009    Years since quitting: 9.1  . Smokeless tobacco: Never Used  Substance Use Topics  . Alcohol use: Yes    Comment: rare 3 times a year per pt  . Drug use: No     Allergies   Patient has no known allergies.   Review of Systems Review of Systems  All other systems reviewed and are negative.    Physical Exam Updated Vital Signs BP 129/75   Pulse (!) 103   Temp 98.6 F (37 C)   Resp (!) 28   Ht '5\' 2"'  (1.575 m)   Wt 87.5 kg   SpO2 93%   BMI 35.30 kg/m   Physical Exam Vitals signs and nursing note reviewed.  Constitutional:      General: She is not in acute distress.    Appearance: She is well-developed. She is obese. She is ill-appearing. She is not toxic-appearing or diaphoretic.  HENT:     Head: Normocephalic and atraumatic.  Eyes:     Conjunctiva/sclera: Conjunctivae normal.     Pupils: Pupils are equal, round, and  reactive to light.  Neck:     Musculoskeletal: Normal range of motion and neck supple.     Trachea: Phonation normal.  Cardiovascular:     Rate and Rhythm: Normal rate and regular rhythm.  Pulmonary:     Effort: Pulmonary effort is normal.     Breath sounds: Wheezing present.  Chest:     Chest wall: No tenderness.  Abdominal:     Palpations: Abdomen is soft.  Musculoskeletal: Normal range of motion.     Right lower leg: No edema.     Left lower leg: No edema.  Skin:    General: Skin is warm and dry.  Neurological:     Mental Status: She is alert and oriented to person, place, and time.     Motor: No abnormal muscle tone.  Psychiatric:        Mood and Affect: Mood normal.        Behavior: Behavior normal.        Thought Content: Thought content normal.        Judgment: Judgment normal.      ED Treatments / Results  Labs (all labs ordered are listed, but only abnormal results are displayed) Labs Reviewed  BASIC METABOLIC PANEL - Abnormal; Notable for the following components:      Result Value   CO2 19 (*)    Glucose, Bld 323 (*)    Creatinine, Ser 1.19 (*)    GFR calc non Af Amer 44 (*)    GFR calc Af Amer 51 (*)    Anion gap  16 (*)    All other components within normal limits  TROPONIN I (HIGH SENSITIVITY) - Abnormal; Notable for the following components:   Troponin I (High Sensitivity) 76 (*)    All other components within normal limits  TROPONIN I (HIGH SENSITIVITY) - Abnormal; Notable for the following components:   Troponin I (High Sensitivity) 208 (*)    All other components within normal limits  SARS CORONAVIRUS 2 (TAT 6-24 HRS)  CBC  CBC    EKG EKG Interpretation  Date/Time:  Tuesday January 27 2019 11:46:51 EST Ventricular Rate:  119 PR Interval:  138 QRS Duration: 102 QT Interval:  336 QTC Calculation: 472 R Axis:   -72 Text Interpretation: Sinus tachycardia Left anterior fascicular block Left ventricular hypertrophy with repolarization  abnormality ( R in aVL , Cornell product , Romhilt-Estes ) Cannot rule out Septal infarct , age undetermined Possible Lateral infarct , age undetermined Abnormal ECG Since last tracing rate faster and non-specific ST abnormality laterally Confirmed by Daleen Bo [phone]) on 01/27/2019 1:20:38 PM   Radiology Dg Chest 2 View  Result Date: 01/27/2019 CLINICAL DATA:  Acute shortness of breath EXAM: CHEST - 2 VIEW COMPARISON:  01/29/2018 FINDINGS: The cardiomediastinal silhouette is unremarkable. Minimal subsegmental atelectasis/scarring within the UPPER RIGHT lung and LOWER LEFT lung noted. There is no evidence of focal airspace disease, pulmonary edema, suspicious pulmonary nodule/mass, pleural effusion, or pneumothorax. No acute bony abnormalities are identified. IMPRESSION: No evidence of acute cardiopulmonary disease. Electronically Signed   By: Margarette Canada M.D.   On: 01/27/2019 12:34   Ct Angio Chest Pe W/cm &/or Wo Cm  Result Date: 01/27/2019 CLINICAL DATA:  Shortness of breath EXAM: CT ANGIOGRAPHY CHEST WITH CONTRAST TECHNIQUE: Multidetector CT imaging of the chest was performed using the standard protocol during bolus administration of intravenous contrast. Multiplanar CT image reconstructions and MIPs were obtained to evaluate the vascular anatomy. CONTRAST:  153m OMNIPAQUE IOHEXOL 350 MG/ML SOLN COMPARISON:  None. FINDINGS: Cardiovascular: Satisfactory opacification of the pulmonary arteries. Filling defects reflecting acute pulmonary emboli are present within the pulmonary arteries beginning at the bifurcation of the main pulmonary artery with additional lobar, segmental, and subsegmental involvement bilaterally. There is enlargement of the right ventricle relative to the left with straightening of the interventricular septum. Mediastinum/Nodes: No adenopathy. Lungs/Pleura: Central airways are patent. Patchy ground-glass density likely related to altered perfusion and atelectasis. No pleural  effusion or pneumothorax. Upper Abdomen: Small hiatal hernia. Musculoskeletal: Mild degenerative changes of the included spine. Review of the MIP images confirms the above findings. IMPRESSION: Positive for acute PE with CT evidence of right heart strain (RV/LV Ratio = 1.7) consistent with at least submassive (intermediate risk) PE. The presence of right heart strain has been associated with an increased risk of morbidity and mortality. Please activate Code PE by paging [phone] These results were called by telephone at the time of interpretation on 01/27/2019 at 5:01 pm [facility] who verbally acknowledged these results. Electronically Signed   By: PMacy MisM.D.   On: 01/27/2019 17:08    Procedures .Critical Care Performed by: WDaleen Bo MD Authorized by: WDaleen Bo MD   Critical care provider statement:    Critical care time (minutes):  35   Critical care start time:  01/27/2019 12:45 PM   Critical care end time:  01/27/2019 5:50 PM   Critical care time was exclusive of:  Separately billable procedures and treating other patients   Critical care was necessary to treat or  prevent imminent or life-threatening deterioration of the following conditions:  Respiratory failure   Critical care was time spent personally by me on the following activities:  Blood draw for specimens, development of treatment plan with patient or surrogate, discussions with consultants, evaluation of patient's response to treatment, examination of patient, obtaining history from patient or surrogate, ordering and performing treatments and interventions, ordering and review of laboratory studies, pulse oximetry, re-evaluation of patient's condition, review of old charts and ordering and review of radiographic studies   (including critical care time)  Medications Ordered in ED Medications  sodium chloride flush (NS) 0.9 % injection 3 mL (has no administration in time range)  heparin ADULT  infusion 100 units/mL (25000 units/267m sodium chloride 0.45%) (1,250 Units/hr Intravenous New Bag/Given 01/27/19 1725)  iohexol (OMNIPAQUE) 350 MG/ML injection 100 mL (100 mLs Intravenous Contrast Given 01/27/19 1516)  heparin bolus via infusion 5,000 Units (5,000 Units Intravenous Bolus from Bag 01/27/19 1726)     Initial Impression / Assessment and Plan / ED Course  I have reviewed the triage vital signs and the nursing notes.  Pertinent labs & imaging results that were available during my care of the patient were reviewed by me and considered in my medical decision making (see chart for details).  Clinical Course as of Jan 26 1750  Tue Jan 27, 2019  1318 Normal except CO2 low, glucose high, creatinine high, GFR low, anion gap high  Basic metabolic panel(!) [EW]  14818Abnormal, high  Troponin I (High Sensitivity)(!) [EW]  1318 Normal  CBC [EW]  1318 No infiltrate or CHF, images interpreted by me   [EW]  1331 Rectal temperature normal.  Patient with symptomatic dyspnea, low oxygenation, requiring nasal cannula supplementation.   [EW]  1510 Abnormal, higher delta troponin indicative of ischemia.  Troponin I (High Sensitivity)(!!) [EW]  1701 Discussed with radiologist, patient has large volume PE with right heart strain.  This is consistent with a Code PE.  Will contact intensivist and order heparin.   [EW]  15631Discussed with PCCM.  No indication for more aggressive management of PE at this time.   [EW]    Clinical Course User Index [EW] WDaleen Bo MD        Patient Vitals for the past 24 hrs:  BP Temp Temp src Pulse Resp SpO2 Height Weight  01/27/19 1600 129/75 - - (!) 103 (!) 28 93 % - -  01/27/19 1530 127/75 - - (!) 104 (!) 27 93 % - -  01/27/19 1519 125/77 98.6 F (37 C) - (!) 110 20 92 % - -  01/27/19 1504 - - - (!) 107 (!) 21 94 % - -  01/27/19 1500 125/77 - - - - - - -  01/27/19 1430 129/78 - - - - - - -  01/27/19 1429 - - - (!) 109 (!) 22 93 % - -  01/27/19  1346 - - - - - 93 % - -  01/27/19 1340 121/72 - - (!) 113 (!) 22 91 % - -  01/27/19 1335 109/75 - - (!) 117 (!) 29 91 % - -  01/27/19 1328 - 99.5 F (37.5 C) Rectal - - - - -  01/27/19 1141 - - - - - - '5\' 2"'  (1.575 m) 87.5 kg  01/27/19 1136 115/76 97.7 F (36.5 C) Oral (!) 116 (!) 24 90 % - -    5:34 PM Reevaluation with update and discussion. After initial assessment and treatment, an updated evaluation  reveals no change in clinical status.  Findings discussed and questions answered. Daleen Bo   Medical Decision Making: Shortness of breath, with hypoxia, requiring oxygen supplementation.  Hemodynamically stable.  She has large volume PE, requiring anticoagulation with heparin.  Case discussed with critical care medicine who does not believe that she needs thrombolysis or acute intervention at this time.  She requires hospitalization for anticoagulation and close monitoring.  CRITICAL CARE-yes Performed by: Daleen Bo  Nursing Notes Reviewed/ Care Coordinated Applicable Imaging Reviewed Interpretation of Laboratory Data incorporated into ED treatment  5:05 PM-Consult complete with hospitalist. Patient case explained and discussed.  He agrees to admit patient for further evaluation and treatment. Call ended at 1515 p.m.  Final Clinical Impressions(s) / ED Diagnoses   Final diagnoses:  Hypoxia  Dyspnea, unspecified type    ED Discharge Orders    None       Daleen Bo, MD 01/27/19 1751

## 2019-01-27 NOTE — Progress Notes (Signed)
ANTICOAGULATION CONSULT NOTE - Initial Consult  Pharmacy Consult for Heparin  Indication: pulmonary embolus  No Known Allergies  Patient Measurements: Height: 5\' 2"  (157.5 cm) Weight: 193 lb (87.5 kg) IBW/kg (Calculated) : 50.1 Heparin Dosing Weight: 70.1 kg  Vital Signs: Temp: 98.6 F (37 C) (12/08 1519) Temp Source: Rectal (12/08 1328) BP: 125/77 (12/08 1519) Pulse Rate: 110 (12/08 1519)  Labs: Recent Labs    01/27/19 1155 01/27/19 1343  HGB 14.6  --   HCT 46.0  --   PLT 186  --   CREATININE 1.19*  --   TROPONINIHS 76* 208*    Estimated Creatinine Clearance: 40.7 mL/min (A) (by C-G formula based on SCr of 1.19 mg/dL (H)).   Medical History: Past Medical History:  Diagnosis Date  . Anxiety   . Back pain   . Chest pain    CLite with apical ischemia in 2006 - normal coronary arteries by [facility] in 12/2004;  Myoview 11/12:  Low risk stress nuclear study with a small, partially reversible apical defect most likely related to apical thinning; cannot R/O very mild apical ischemia.  EF: 75%   . Decreased hearing   . Depression   . Diabetes ([place])   . DVT (deep venous thrombosis) ([place]) 2006   hx of  . Ectopic pregnancy with intrauterine pregnancy   . Hiatal hernia   . Hyperlipidemia   . PE (pulmonary embolism) 2006   hx of  . Sinus congestion   . Sleep apnea    CPAP     Medications:  Scheduled:  . heparin  5,000 Units Intravenous Once  . methylPREDNISolone acetate  40 mg Intra-articular Once  . sodium chloride flush  3 mL Intravenous Once    Assessment: Patient is a 33 yof that presents to the ED with complaints of SOB that has worsened over the course of 2 weeks. The patient was found to have a PE with signs of right heart strain on further examination. Pharmacy has been asked to dose heparin in this patient.   Goal of Therapy:  Heparin level 0.3-0.7 units/ml Monitor platelets by anticoagulation protocol: Yes   Plan:  - Heparin bolus 5000 units  - Heparin  drip @ 1250 units/hr - Check heparin level in 6 hours  - Monitor patient for s/s of bleeding and CBC daily while on heparin.    Duanne Limerick PharmD. BCPS  01/27/2019,5:18 PM

## 2019-01-27 NOTE — ED Notes (Signed)
Pt on the phone with her dtr

## 2019-01-27 NOTE — ED Notes (Signed)
purewick in place.

## 2019-01-27 NOTE — ED Notes (Signed)
Paged dr Jonelle Sidle to [facility]

## 2019-01-27 NOTE — ED Notes (Signed)
Paged dr Jonelle Sidle to [facility]

## 2019-01-27 NOTE — ED Notes (Signed)
Text messaged Dr Baltazar Najjar to obtain an order for CPAP.

## 2019-01-27 NOTE — H&P (Signed)
History and Physical    Sierra Ford DOB: 09-12-1940 DOA: 01/27/2019  PCP: Ann Held, DO  Patient coming from: Home  Chief Complaint: SOB  HPI: Sierra Ford is a 78 y.o. female with medical history significant of obesity, DM, HTN, HLD presening to the ED with progressive sob. SOB first began 4-5 days prior to ED visit. Symptoms progressed and pt believed her sob was related to CHF. Pt was scheduled to see her Cardiologist on the day of ED visit, however on the morning of visit, pt developed acute worsening of SOB resulting in near syncopal episode. Pt subsequently presented to the ED for further work up.. Denies fevers, sick contacts. Pt reports recent negative covid test prior to ED visit.   Of note, pt has prior hx of R LE DVT and PE treated with coumadin x 7yr  ED Course: In the ED, patient noted to have unremarkable CXR. Follow up CT notable for acute PE with R heart strain. Patient was started on heparin gtt. Hospitalist service consulted for consideration for admission.  COVID testing pending  Review of Systems:  Review of Systems  Constitutional: Negative for chills, fever and malaise/fatigue.  HENT: Negative for congestion, ear pain and nosebleeds.   Eyes: Negative for double vision and photophobia.  Respiratory: Positive for shortness of breath. Negative for hemoptysis, sputum production and wheezing.   Cardiovascular: Negative for palpitations and claudication.  Gastrointestinal: Negative for abdominal pain, nausea and vomiting.  Genitourinary: Negative for frequency, hematuria and urgency.  Musculoskeletal: Negative for back pain, joint pain and neck pain.  Neurological: Negative for tingling, tremors and seizures.       Near syncope  Psychiatric/Behavioral: Negative for hallucinations and memory loss. The patient is not nervous/anxious.     Past Medical History:  Diagnosis Date  . Anxiety   . Back pain   . Chest pain    CLite with  apical ischemia in 2006 [facility];  Myoview 11/12:  Low risk stress nuclear study with a small, partially reversible apical defect most likely related to apical thinning; cannot R/O very mild apical ischemia.  EF: 75%   . Decreased hearing   . Depression   . [place]   . D[place]) [place] 2006   hx of  . Ectopic pregnancy with intrauterine pregnancy   . Hiatal hernia   . Hyperlipidemia   . PE (pulmonary embolism) 2006   hx of  . Sinus congestion   . Sleep apnea    CPAP     Past Surgical History:  Procedure Laterality Date  . ABDOMINAL EXPLORATION SURGERY    . BUNIONECTOMY     right  . COLONOSCOPY  2009  . ECTOPIC PREGNANCY SURGERY    . FOOT SURGERY    . TUBAL LIGATION       reports that she quit smoking about 9 years ago. Her smoking use included cigarettes. She has a 50.00 pack-year smoking history. She has never used smokeless tobacco. She reports current alcohol use. She reports that she does not use drugs.  No Known Allergies  Family History  Problem Relation Age of Onset  . Heart failure Mother        died from  . Colon polyps Mother   . Hypertension Mother   . Sudden death Mother   . Obesity Mother   . Alcoholism Father   . Diabetes Maternal Aunt  grandaughter  . Diabetes Maternal Aunt   . Colon cancer Neg Hx   . Esophageal cancer Neg Hx   . Stomach cancer Neg Hx     Prior to Admission medications   Medication Sig Start Date End Date Taking? Authorizing Provider  Alirocumab (PRALUENT) 75 MG/ML SOAJ Inject 1 pen into the skin every 14 (fourteen) days. 10/23/18  Yes Minus Breeding, MD  aspirin EC 81 MG tablet Take 81 mg by mouth at bedtime.     Yes [provider]  blood glucose meter kit and supplies KIT Dispense based on patient and insurance preference. Use up to four times daily as directed. (FOR ICD-9 250.00, 250.01). 04/08/17  Yes Lowne Lyndal Pulley R, DO  CALCIUM-VITAMIN D PO Take  600 Units by mouth 2 (two) times daily.     Yes [provider]  cholecalciferol (VITAMIN D-400) 400 UNITS TABS Take 400 Units by mouth 2 (two) times daily.    Yes [provider]  Coenzyme Q10 100 MG TABS Take 100 mg by mouth daily.   Yes [provider]  etodolac (LODINE) 400 MG tablet Take 1 tablet (400 mg total) by mouth 2 (two) times daily as needed. Patient taking differently: Take 400 mg by mouth 2 (two) times daily as needed for mild pain.  02/27/18  Yes Hilts, Legrand Como, MD  Flaxseed, Linseed, (FLAXSEED OIL PO) Take 1,000 mg by mouth daily.    Yes [provider]  furosemide (LASIX) 40 MG tablet Take 1 tablet (40 mg total) by mouth daily. 10/02/18  Yes Minus Breeding, MD  metFORMIN (GLUCOPHAGE) 500 MG tablet Take 1 tablet (500 mg total) by mouth 2 (two) times daily with a meal. 10/02/18  Yes Minus Breeding, MD  Omega-3 Fatty Acids (FISH OIL PO) Take 1,200 mg by mouth daily.    Yes [provider]  POTASSIUM CHLORIDE PO Take 1 tablet by mouth as needed (muscle cramps).    Yes [provider]  rosuvastatin (CRESTOR) 10 MG tablet Take 1 tablet (10 mg total) by mouth daily. 10/02/18  Yes Minus Breeding, MD  TRUE METRIX BLOOD GLUCOSE TEST test strip TEST  UP  TO FOUR TIMES DAILY AS DIRECTED 12/05/17  Yes Carollee Herter, Yvonne R, DO  TRUEPLUS LANCETS 33G MISC TEST  UP TO FOUR TIMES DAILY AS DIRECTED 12/05/17  Yes Lowne Lyndal Pulley R, DO  Wheat Dextrin (BENEFIBER PO) Take 30 mLs by mouth 2 (two) times daily.   Yes [provider]  hydrocortisone (ANUSOL-HC) 2.5 % rectal cream Place 1 application rectally 2 (two) times daily. Patient not taking: Reported on 01/27/2019 11/27/18   Carollee Herter, Alferd Apa, DO  hydrocortisone (ANUSOL-HC) 25 MG suppository Place 1 suppository (25 mg total) rectally at bedtime as needed for hemorrhoids or anal itching. Patient not taking: Reported on 01/27/2019 01/02/19   Mauri Pole, MD  tiZANidine  (ZANAFLEX) 2 MG tablet Take 1-2 tablets (2-4 mg total) by mouth at bedtime as needed for muscle spasms. Patient not taking: Reported on 01/27/2019 02/27/18   Hilts, Legrand Como, MD  Zoster Vaccine Adjuvanted [facility]) injection 0.47m IM x1  Repeat in 2-6 months 11/06/18   LCarollee Herter YAlferd Apa DO  omega-3 acid ethyl esters (LOVAZA) 1 G capsule Take 1 g by mouth 3 (three) times daily.    05/07/11  [provider]  pravastatin (PRAVACHOL) 20 MG tablet Take 20 mg by mouth daily.    05/07/11  [provider]    Physical Exam: Vitals:  01/27/19 1504 01/27/19 1519 01/27/19 1530 01/27/19 1600  BP:  125/77 127/75 129/75  Pulse: (!) 107 (!) 110 (!) 104 (!) 103  Resp: (!) 21 20 (!) 27 (!) 28  Temp:  98.6 F (37 C)    TempSrc:      SpO2: 94% 92% 93% 93%  Weight:      Height:        Constitutional: NAD, calm, comfortable Vitals:   01/27/19 1504 01/27/19 1519 01/27/19 1530 01/27/19 1600  BP:  125/77 127/75 129/75  Pulse: (!) 107 (!) 110 (!) 104 (!) 103  Resp: (!) 21 20 (!) 27 (!) 28  Temp:  98.6 F (37 C)    TempSrc:      SpO2: 94% 92% 93% 93%  Weight:      Height:       Eyes: PERRL, lids and conjunctivae normal ENMT: Mucous membranes are moist. Posterior pharynx clear of any exudate or lesions.Normal dentition.  Neck: normal, supple, no masses, no thyromegaly Respiratory: Mildly increased resp effort, no audible wheezing Cardiovascular: Regular rate and rhythm, s1, s2.  Abdomen: no tenderness, no masses palpated. No hepatosplenomegaly. Bowel sounds positive.  Musculoskeletal: no clubbing / cyanosis. No joint deformity upper and lower extremities. Good ROM, no contractures. Normal muscle tone.  Skin: no rashes, lesions, ulcers. No induration Neurologic: CN 2-12 grossly intact. Sensation intact, Strength 5/5 in all 4.  Psychiatric: Normal judgment and insight. Alert and oriented x 3. Normal mood.    Labs on Admission: I have personally reviewed following labs and imaging  studies  CBC: Recent Labs  Lab 01/27/19 1155  WBC 7.2  HGB 14.6  HCT 46.0  MCV 97.9  PLT 048   Basic Metabolic Panel: Recent Labs  Lab 01/27/19 1155  NA 139  K 3.9  CL 104  CO2 19*  GLUCOSE 323*  BUN 21  CREATININE 1.19*  CALCIUM 9.2   GFR: Estimated Creatinine Clearance: 40.7 mL/min (A) (by C-G formula based on SCr of 1.19 mg/dL (H)). Liver Function Tests: No results for input(s): AST, ALT, ALKPHOS, BILITOT, PROT, ALBUMIN in the last 168 hours. No results for input(s): LIPASE, AMYLASE in the last 168 hours. No results for input(s): AMMONIA in the last 168 hours. Coagulation Profile: No results for input(s): INR, PROTIME in the last 168 hours. Cardiac Enzymes: No results for input(s): CKTOTAL, CKMB, CKMBINDEX, TROPONINI in the last 168 hours. BNP (last 3 results) No results for input(s): PROBNP in the last 8760 hours. HbA1C: No results for input(s): HGBA1C in the last 72 hours. CBG: No results for input(s): GLUCAP in the last 168 hours. Lipid Profile: No results for input(s): CHOL, HDL, LDLCALC, TRIG, CHOLHDL, LDLDIRECT in the last 72 hours. Thyroid Function Tests: No results for input(s): TSH, T4TOTAL, FREET4, T3FREE, THYROIDAB in the last 72 hours. Anemia Panel: No results for input(s): VITAMINB12, FOLATE, FERRITIN, TIBC, IRON, RETICCTPCT in the last 72 hours. Urine analysis:    Component Value Date/Time   COLORURINE yellow 09/19/2006 1256   APPEARANCEUR Clear 09/19/2006 1256   LABSPEC 1.015 09/19/2006 1256   PHURINE 6.0 09/19/2006 1256   HGBUR negative 09/19/2006 1256   BILIRUBINUR neg 07/15/2015 1321   PROTEINUR neg 07/15/2015 1321   UROBILINOGEN 0.2 07/15/2015 1321   UROBILINOGEN negative 09/19/2006 1256   NITRITE neg 07/15/2015 1321   NITRITE negative 09/19/2006 1256   LEUKOCYTESUR Negative 07/15/2015 1321   Sepsis Labs: !!!!!!!!!!!!!!!!!!!!!!!!!!!!!!!!!!!!!!!!!!!! '@[email or web address]' (procalcitonin:4,lacticidven:4) )No results found for this or any  previous visit (from the past 240 hour(s)).  Radiological Exams on Admission: Dg Chest 2 View  Result Date: 01/27/2019 CLINICAL DATA:  Acute shortness of breath EXAM: CHEST - 2 VIEW COMPARISON:  01/29/2018 FINDINGS: The cardiomediastinal silhouette is unremarkable. Minimal subsegmental atelectasis/scarring within the UPPER RIGHT lung and LOWER LEFT lung noted. There is no evidence of focal airspace disease, pulmonary edema, suspicious pulmonary nodule/mass, pleural effusion, or pneumothorax. No acute bony abnormalities are identified. IMPRESSION: No evidence of acute cardiopulmonary disease. Electronically Signed   By: Margarette Canada M.D.   On: 01/27/2019 12:34   Ct Angio Chest Pe W/cm &/or Wo Cm  Result Date: 01/27/2019 CLINICAL DATA:  Shortness of breath EXAM: CT ANGIOGRAPHY CHEST WITH CONTRAST TECHNIQUE: Multidetector CT imaging of the chest was performed using the standard protocol during bolus administration of intravenous contrast. Multiplanar CT image reconstructions and MIPs were obtained to evaluate the vascular anatomy. CONTRAST:  167m OMNIPAQUE IOHEXOL 350 MG/ML SOLN COMPARISON:  None. FINDINGS: Cardiovascular: Satisfactory opacification of the pulmonary arteries. Filling defects reflecting acute pulmonary emboli are present within the pulmonary arteries beginning at the bifurcation of the main pulmonary artery with additional lobar, segmental, and subsegmental involvement bilaterally. There is enlargement of the right ventricle relative to the left with straightening of the interventricular septum. Mediastinum/Nodes: No adenopathy. Lungs/Pleura: Central airways are patent. Patchy ground-glass density likely related to altered perfusion and atelectasis. No pleural effusion or pneumothorax. Upper Abdomen: Small hiatal hernia. Musculoskeletal: Mild degenerative changes of the included spine. Review of the MIP images confirms the above findings. IMPRESSION: Positive for acute PE with CT evidence of  right heart strain (RV/LV Ratio = 1.7) consistent with at least submassive (intermediate risk) PE. The presence of right heart strain has been associated with an increased risk of morbidity and mortality. Please activate Code PE by paging [phone] These results were called by telephone at the time of interpretation on 01/27/2019 at 5:01 pm to provider [facility], who verbally acknowledged these results. Electronically Signed   By: PMacy MisM.D.   On: 01/27/2019 17:08    EKG: Independently reviewed. Sinus tach  Assessment/Plan Principal Problem:   Acute pulmonary embolism with acute cor pulmonale (HCC) Active Problems:   Hyperlipidemia LDL goal <70   Chronic diastolic heart failure (HCC)   Personal history of venous thrombosis and embolism   Obesity (BMI 30-39.9)   DM (diabetes mellitus) type II uncontrolled, periph vascular disorder ([place]   Essential hypertension   Acute hypoxemic respiratory failure ([place]   1. Acute PE with R heart strain 1. CTA personally reviewed. Finding of Acute PE with R heart strain 2. BP stable, mildly tachycardic, pt currently requires 4LNC 3. Heparin gtt started in ED, will continue. Consider transition to NOAC when stable after 24-48hrs 4. Will check 2d echo and LE dopplers 5. Follow CBC 6. Given recurrent PT and DVT, pt would likely benefit from lifelong anticoagulation 2. HLD 1. Continue on statin per home regimen 2. Seems stable at this time 3. Chronic diastolic CHF 1. CXR clear 2. Will continue PO lasix per home regimen 4. Obesity 1. Recommend diet/lifestyle modification 2. Stable 5. DM 1. Random glucose of 323 2. Most recent a1c of 6.8 on 11/06/18 3. Will hold metformin while in hospital 4. Will continue on SSI coverage as needed 6. HTN 1. BP stable at this time 2. Cont home meds as tolerated 7. Acute hypoxemic respiratory failure 1. Secondary to presenting acute PE  DVT prophylaxis: Heparin gtt  Code Status: Full Family  Communication: Pt in room, family  not at bedside  Disposition Plan: Uncertain at this time  Consults called:  Admission status: Inpatient as would likely require greater than 2 midnight stay to stabilize and treat acute PE   Marylu Lund MD [facility] Pager On Amion  If 7PM-7AM, please contact night-coverage  01/27/2019, 5:49 PM

## 2019-01-27 NOTE — ED Notes (Signed)
PAGED DR Baltazar Najjar TO RN Adriana Reams

## 2019-01-27 NOTE — ED Notes (Signed)
Called respiratory to get CPAP for this patient.

## 2019-01-27 NOTE — ED Notes (Signed)
RT called to request CPAP unit, pt usually wears this at home

## 2019-01-28 ENCOUNTER — Inpatient Hospital Stay (HOSPITAL_COMMUNITY): Payer: Medicare Other

## 2019-01-28 DIAGNOSIS — I10 Essential (primary) hypertension: Secondary | ICD-10-CM

## 2019-01-28 DIAGNOSIS — I2699 Other pulmonary embolism without acute cor pulmonale: Secondary | ICD-10-CM

## 2019-01-28 DIAGNOSIS — I5032 Chronic diastolic (congestive) heart failure: Secondary | ICD-10-CM

## 2019-01-28 DIAGNOSIS — Z86718 Personal history of other venous thrombosis and embolism: Secondary | ICD-10-CM

## 2019-01-28 DIAGNOSIS — I371 Nonrheumatic pulmonary valve insufficiency: Secondary | ICD-10-CM

## 2019-01-28 DIAGNOSIS — I2609 Other pulmonary embolism with acute cor pulmonale: Principal | ICD-10-CM

## 2019-01-28 DIAGNOSIS — E785 Hyperlipidemia, unspecified: Secondary | ICD-10-CM

## 2019-01-28 DIAGNOSIS — I361 Nonrheumatic tricuspid (valve) insufficiency: Secondary | ICD-10-CM

## 2019-01-28 DIAGNOSIS — E669 Obesity, unspecified: Secondary | ICD-10-CM

## 2019-01-28 LAB — COMPREHENSIVE METABOLIC PANEL
ALT: 19 U/L (ref 0–44)
AST: 29 U/L (ref 15–41)
Albumin: 3.3 g/dL — ABNORMAL LOW (ref 3.5–5.0)
Alkaline Phosphatase: 89 U/L (ref 38–126)
Anion gap: 17 — ABNORMAL HIGH (ref 5–15)
BUN: 20 mg/dL (ref 8–23)
CO2: 24 mmol/L (ref 22–32)
Calcium: 9.3 mg/dL (ref 8.9–10.3)
Chloride: 104 mmol/L (ref 98–111)
Creatinine, Ser: 1.09 mg/dL — ABNORMAL HIGH (ref 0.44–1.00)
GFR calc Af Amer: 57 mL/min — ABNORMAL LOW (ref >60)
GFR calc non Af Amer: 49 mL/min — ABNORMAL LOW (ref >60)
Glucose, Bld: 133 mg/dL — ABNORMAL HIGH (ref 70–99)
Potassium: 3.8 mmol/L (ref 3.5–5.1)
Sodium: 145 mmol/L (ref 135–145)
Total Bilirubin: 1.3 mg/dL — ABNORMAL HIGH (ref 0.3–1.2)
Total Protein: 6.9 g/dL (ref 6.5–8.1)

## 2019-01-28 LAB — CBC
HCT: 43.6 % (ref 36.0–46.0)
Hemoglobin: 14 g/dL (ref 12.0–15.0)
MCH: 31 pg (ref 26.0–34.0)
MCHC: 32.1 g/dL (ref 30.0–36.0)
MCV: 96.7 fL (ref 80.0–100.0)
Platelets: 197 10*3/uL (ref 150–400)
RBC: 4.51 MIL/uL (ref 3.87–5.11)
RDW: 14.7 % (ref 11.5–15.5)
WBC: 6.3 10*3/uL (ref 4.0–10.5)
nRBC: 0 % (ref 0.0–0.2)

## 2019-01-28 LAB — CBG MONITORING, ED
Glucose-Capillary: 130 mg/dL — ABNORMAL HIGH (ref 70–99)
Glucose-Capillary: 160 mg/dL — ABNORMAL HIGH (ref 70–99)

## 2019-01-28 LAB — MRSA PCR SCREENING: MRSA by PCR: NEGATIVE

## 2019-01-28 LAB — ECHOCARDIOGRAM COMPLETE
Height: 62 in
Weight: 3088 oz

## 2019-01-28 LAB — GLUCOSE, CAPILLARY
Glucose-Capillary: 113 mg/dL — ABNORMAL HIGH (ref 70–99)
Glucose-Capillary: 137 mg/dL — ABNORMAL HIGH (ref 70–99)

## 2019-01-28 LAB — HEPARIN LEVEL (UNFRACTIONATED)
Heparin Unfractionated: 1.34 IU/mL — ABNORMAL HIGH (ref 0.30–0.70)
Heparin Unfractionated: 1.04 IU/mL — ABNORMAL HIGH (ref 0.30–0.70)

## 2019-01-28 MED ORDER — PANTOPRAZOLE SODIUM 40 MG IV SOLR
40.0000 mg | INTRAVENOUS | Status: AC
  Administered 2019-01-28 – 2019-01-30 (×3): 40 mg via INTRAVENOUS
  Filled 2019-01-28 (×3): qty 40

## 2019-01-28 MED ORDER — SODIUM CHLORIDE 0.9 % IV SOLN
250.0000 mL | Freq: Once | INTRAVENOUS | Status: AC
  Administered 2019-01-28: 23:00:00 250 mL via INTRAVENOUS

## 2019-01-28 MED ORDER — HEPARIN (PORCINE) 25000 UT/250ML-% IV SOLN
1100.0000 [IU]/h | INTRAVENOUS | Status: DC
  Administered 2019-01-28: 09:00:00 1100 [IU]/h via INTRAVENOUS
  Filled 2019-01-28: qty 250

## 2019-01-28 MED ORDER — HEPARIN (PORCINE) 25000 UT/250ML-% IV SOLN: 950.0000 [IU]/h | INTRAVENOUS | Status: DC

## 2019-01-28 MED ORDER — ALTEPLASE (PULMONARY EMBOLISM) INFUSION
100.0000 mg | INTRAVENOUS | Status: AC
  Administered 2019-01-28: 100 mg via INTRAVENOUS
  Filled 2019-01-28: qty 100

## 2019-01-28 MED ORDER — ROSUVASTATIN CALCIUM 5 MG PO TABS
10.0000 mg | ORAL_TABLET | Freq: Every day | ORAL | Status: DC
  Administered 2019-01-28 – 2019-01-30 (×3): 10 mg via ORAL
  Filled 2019-01-28 (×4): qty 2

## 2019-01-28 MED ORDER — CHLORHEXIDINE GLUCONATE CLOTH 2 % EX PADS
6.0000 | MEDICATED_PAD | Freq: Every day | CUTANEOUS | Status: DC
  Administered 2019-01-30: 6 via TOPICAL

## 2019-01-28 MED ORDER — PERFLUTREN LIPID MICROSPHERE
1.0000 mL | INTRAVENOUS | Status: AC | PRN
  Administered 2019-01-28: 2 mL via INTRAVENOUS
  Filled 2019-01-28: qty 10

## 2019-01-28 MED ORDER — INSULIN ASPART 100 UNIT/ML ~~LOC~~ SOLN
0.0000 [IU] | SUBCUTANEOUS | Status: DC
  Administered 2019-01-28 – 2019-01-30 (×5): 1 [IU] via SUBCUTANEOUS
  Administered 2019-01-30: 08:00:00 2 [IU] via SUBCUTANEOUS
  Administered 2019-01-30 – 2019-01-31 (×2): 1 [IU] via SUBCUTANEOUS
  Administered 2019-01-31: 09:00:00 2 [IU] via SUBCUTANEOUS
  Administered 2019-01-31: 1 [IU] via SUBCUTANEOUS

## 2019-01-28 NOTE — Progress Notes (Signed)
ANTICOAGULATION CONSULT NOTE  Pharmacy Consult for Heparin  Indication: pulmonary embolus  No Known Allergies  Patient Measurements: Height: 5\' 2"  (157.5 cm) Weight: 193 lb (87.5 kg) IBW/kg (Calculated) : 50.1 Heparin Dosing Weight: 70.1 kg  Vital Signs: BP: 122/73 (12/09 0700) Pulse Rate: 88 (12/09 0700)  Labs: Recent Labs    01/27/19 1155 01/27/19 1343 01/28/19 0218 01/28/19 0441  HGB 14.6  --   --  14.0  HCT 46.0  --   --  43.6  PLT 186  --   --  197  HEPARINUNFRC  --   --  1.34*  --   CREATININE 1.19*  --   --  1.09*  TROPONINIHS 76* 208*  --   --     Estimated Creatinine Clearance: 44.4 mL/min (A) (by C-G formula based on SCr of 1.09 mg/dL (H)).   Medical History: Past Medical History:  Diagnosis Date  . Anxiety   . Back pain   . Chest pain    CLite with apical ischemia in 2006 - normal coronary arteries by [facility] in 12/2004;  Myoview 11/12:  Low risk stress nuclear study with a small, partially reversible apical defect most likely related to apical thinning; cannot R/O very mild apical ischemia.  EF: 75%   . Decreased hearing   . Depression   . Diabetes ([place])   . DVT (deep venous thrombosis) ([place]) 2006   hx of  . Ectopic pregnancy with intrauterine pregnancy   . Hiatal hernia   . Hyperlipidemia   . PE (pulmonary embolism) 2006   hx of  . Sinus congestion   . Sleep apnea    CPAP     Medications:  Scheduled:  . furosemide  40 mg Oral Daily  . insulin aspart  0-15 Units Subcutaneous TID WC  . insulin aspart  0-5 Units Subcutaneous QHS  . methylPREDNISolone acetate  40 mg Intra-articular Once  . rosuvastatin  10 mg Oral Daily  . sodium chloride flush  3 mL Intravenous Once    Assessment: 73 yof presenting to the ED with complaints of SOB that has worsened over the course of 2 weeks. The patient was found to have a PE with signs of right heart strain on further examination. Pharmacy has been asked to dose heparin in this patient.   Heparin level  initially reported as therapeutic this morning; however, lab was corrected to now show as supratherapeutic at 1.34. Heparin is running in the right [facility] and per discussion with patient, level was drawn from her left hand - appears this is an accurate level. Will hold heparin x 1 hour and resume at reduced rate - communicated plan with RN in the room. CBC wnl. No active bleed or issues with the infusion reported.  Goal of Therapy:  Heparin level 0.3-0.7 units/ml Monitor platelets by anticoagulation protocol: Yes   Plan:  Hold heparin x 1 hour and resume at reduced rate of 1100 units/hr at 0900 6h heparin level from resumption Monitor daily heparin level and CBC, s/sx bleeding   Elicia Lamp, PharmD, BCPS Please check AMION for all [place] contact numbers Clinical Pharmacist 01/28/2019 8:03 AM

## 2019-01-28 NOTE — ED Notes (Signed)
Vascular US just called to report a positive for DVT in pt's Rt lower leg.

## 2019-01-28 NOTE — Progress Notes (Deleted)
ANTICOAGULATION CONSULT NOTE  Pharmacy Consult for Heparin  Indication: pulmonary embolus  No Known Allergies  Patient Measurements: Height: 5\' 2"  (157.5 cm) Weight: 193 lb (87.5 kg) IBW/kg (Calculated) : 50.1 Heparin Dosing Weight: 70.1 kg  Vital Signs: Temp: 98.2 F (36.8 C) (12/09 1558) BP: 132/74 (12/09 1558) Pulse Rate: 93 (12/09 1558)  Labs: Recent Labs    01/27/19 1155 01/27/19 1343 01/28/19 0218 01/28/19 0441 01/28/19 1605  HGB 14.6  --   --  14.0  --   HCT 46.0  --   --  43.6  --   PLT 186  --   --  197  --   HEPARINUNFRC  --   --  1.34*  --  1.04*  CREATININE 1.19*  --   --  1.09*  --   TROPONINIHS 76* 208*  --   --   --     Estimated Creatinine Clearance: 44.4 mL/min (A) (by C-G formula based on SCr of 1.09 mg/dL (H)).   Medical History: Past Medical History:  Diagnosis Date  . Anxiety   . Back pain   . Chest pain    CLite with apical ischemia in 2006 - normal coronary arteries by [facility] in 12/2004;  Myoview 11/12:  Low risk stress nuclear study with a small, partially reversible apical defect most likely related to apical thinning; cannot R/O very mild apical ischemia.  EF: 75%   . Decreased hearing   . Depression   . Diabetes ([place])   . DVT (deep venous thrombosis) ([place]) 2006   hx of  . Ectopic pregnancy with intrauterine pregnancy   . Hiatal hernia   . Hyperlipidemia   . PE (pulmonary embolism) 2006   hx of  . Sinus congestion   . Sleep apnea    CPAP     Assessment: 78 yr old female presented to the ED with complaint of SOB that has worsened over the course of 2 weeks. The patient was found to have a PE with signs of right heart strain. Pt has hx of R LE DVT and PE treated with warfarin X 2 years (not on anticoagulation prior to admission). Pharmacy has been asked to dose heparin in this patient.   Heparin level initially reported as therapeutic this morning; however, lab was corrected to now show as supratherapeutic at 1.34. Heparin is running  in the right [facility] and per discussion with patient, level was drawn from her left hand - appears this is an accurate level. Heparin was held X 1 hr, then resumed at 1100 units/hr at 0908 this AM.   Heparin level drawn ~7 hrs after infusion was held X 1 hr then restarted at lower rate of 1100 units/hr was 1.04 units/ml, which still remains above the goal range for this pt. CBC WNL. Per RN, no issues with IV or bleeding observed.  Per RN, pt to be transferred to ICU S/P echo to receive TPA.  Goal of Therapy:  Heparin level 0.3-0.7 units/ml (will target lower goal of 0.3-0.5 units/ml while on TPA) Monitor platelets by anticoagulation protocol: Yes   Plan:  Hold heparin x 1 hour, then resume heparin infusion at reduced rate of 950 units/hr  Check 6-hr heparin level after heparin drip resumed  Monitor daily heparin level, CBC Monitor for signs/symptoms of bleeding  Gillermina Hu, PharmD, BCPS, [facility] Clinical Pharmacist 01/28/2019 6:19 PM

## 2019-01-28 NOTE — Progress Notes (Signed)
Bilateral lower extremity venous duplex has been completed. Preliminary results can be found in CV Proc through chart review.  Results were given to the patient's nurse, Arby Barrette.  01/28/19 8:35 AM Sierra Ford RVT

## 2019-01-28 NOTE — Progress Notes (Deleted)
ANTICOAGULATION CONSULT NOTE  Pharmacy Consult for Heparin  Indication: pulmonary embolus  No Known Allergies  Patient Measurements: Height: 5\' 2"  (157.5 cm) Weight: 193 lb (87.5 kg) IBW/kg (Calculated) : 50.1 Heparin Dosing Weight: 70.1 kg  Vital Signs: Temp: 98.2 F (36.8 C) (12/09 1558) BP: 132/74 (12/09 1558) Pulse Rate: 93 (12/09 1558)  Labs: Recent Labs    01/27/19 1155 01/27/19 1343 01/28/19 0218 01/28/19 0441 01/28/19 1605  HGB 14.6  --   --  14.0  --   HCT 46.0  --   --  43.6  --   PLT 186  --   --  197  --   HEPARINUNFRC  --   --  1.34*  --  1.04*  CREATININE 1.19*  --   --  1.09*  --   TROPONINIHS 76* 208*  --   --   --     Estimated Creatinine Clearance: 44.4 mL/min (A) (by C-G formula based on SCr of 1.09 mg/dL (H)).   Medical History: Past Medical History:  Diagnosis Date  . Anxiety   . Back pain   . Chest pain    CLite with apical ischemia in 2006 - normal coronary arteries by [facility] in 12/2004;  Myoview 11/12:  Low risk stress nuclear study with a small, partially reversible apical defect most likely related to apical thinning; cannot R/O very mild apical ischemia.  EF: 75%   . Decreased hearing   . Depression   . Diabetes ([place])   . DVT (deep venous thrombosis) ([place]) 2006   hx of  . Ectopic pregnancy with intrauterine pregnancy   . Hiatal hernia   . Hyperlipidemia   . PE (pulmonary embolism) 2006   hx of  . Sinus congestion   . Sleep apnea    CPAP     Assessment: 78 yr old female presented to the ED with complaint of SOB that has worsened over the course of 2 weeks. The patient was found to have a PE with signs of right heart strain. Pt has hx of R LE DVT and PE treated with warfarin X 2 years (not on anticoagulation prior to admission). Pharmacy has been asked to dose heparin in this patient.   Heparin level initially reported as therapeutic this morning; however, lab was corrected to now show as supratherapeutic at 1.34. Heparin is running  in the right [facility] and per discussion with patient, level was drawn from her left hand - appears this is an accurate level. Heparin was held X 1 hr, then resumed at 1100 units/hr at 0908 this AM.   Heparin level drawn ~7 hrs after infusion was held X 1 hr then restarted at lower rate of 1100 units/hr was 1.04 units/ml, which still remains above the goal range for this pt. CBC WNL. Per RN, no issues with IV or bleeding observed.  Goal of Therapy:  Heparin level 0.3-0.7 units/ml (will target lower goal of 0.3-0.5 units/ml while on TPA) Monitor platelets by anticoagulation protocol: Yes   Plan:  Hold heparin x 1 hour, then resume heparin infusion at reduced rate of 950 units/hr  Check 8-hr heparin level after heparin drip resumed Monitor daily heparin level, CBC Monitor for signs/symptoms of bleeding  Gillermina Hu, PharmD, BCPS, [facility] Clinical Pharmacist 01/28/2019 6:40 PM

## 2019-01-28 NOTE — Consult Note (Signed)
NAME:  Sierra Ford, MRN:  [id number], DOB:  02-Aug-1940, LOS: 1 ADMISSION DATE:  01/27/2019, CONSULTATION DATE:  01/28/2019 REFERRING MD:  Dr. Louanne Belton, CHIEF COMPLAINT:  PE w/right heart strain  Brief History   20 yoF presenting with progressive 4-5 SOB with near syncopal episode found to have acute PE, RLE DVT, and TTE with significant RV dysfunction.  Has remained hemodynamically stable and on minimal O2 [place].   PCCM consulted given significant TTE findings.   History of present illness   78 year old female with history of RLE DVT and PE in 2006 s/p coumadin for 2 years, DM, HTN, HLD, former smoker (quit over 8 years ago), obesity who presented on 12/8 with progressive SOB with near syncopal episode.    Patient reports PE and RLE DVT in 2006 which at that time was attributed to her smoking and estrogen therapy.  She was on coumadin for 2 years.  Patient reports she is up to date on her cancer screenings and denies recent travel.  Only complaints of SOB and some dizzy spells but denies any syncopal episodes, chest pain, falls, recent bleeding events - other than some hemorrhoidal spotting several weeks ago when she wiped only had small amount of blood.  States she noticed getting dyspneic around Thanksgiving but much more pronounced over the last 4-5 days. No lower leg swelling or pain.     In ER, CXR unremarkable.  CTA chest PE noted for acute submassive PE with RV/LV ratio 1.7.  She has been hemodynamically stable and on minimal O2 requirement.  She was started on heparin and admitted to hospitalist service. DVT of LE positive for DVT in the right femoral vein, right popliteal vein, right posterior tibial veins, right peroneal veins, and right gastrocnemius veins.  TTE completed today showing severely dilated RV with severely reduced RV function with flattened septum consistent with RV pressure overload; LVEF 55-60%.  Since admission, patient reports her breathing/ SOB is somewhat better.  PCCM  consulted given significant TTE findings of RV dysfunction.   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   12/9 LE VAS duplex DVT >> Right: Findings consistent with acute deep vein thrombosis involving the right femoral vein, right popliteal vein, right posterior tibial veins, right peroneal veins, and right gastrocnemius veins. No cystic structure found in the popliteal fossa. Left: There is no evidence of deep vein thrombosis in the lower extremity. However, portions of this examination were limited- see technologist comments above. No cystic structure found in the popliteal fossa.  12/9 TTE >>  1. Severely dilated RV with severely reduced function. Septum is flattened in systole/diastole consistent with RV pressure overload. RVSP is moderately elevated on this study. Findings consistent with submassive PE on review of medical record and CTA  chest. Findings communicated to [name], MD @ 5:25 PM.  2. Left ventricular ejection fraction, by visual estimation, is 55 to 60%. The left ventricle has normal function. There is no left ventricular hypertrophy.  3. Definity contrast agent was given IV to delineate the left ventricular endocardial borders.  4. Right ventricular volume and pressure overload.  5. The left ventricle demonstrates regional wall motion abnormalities.  6. Global right ventricle has severely reduced systolic function.The right ventricular size is severely enlarged. No increase in right ventricular wall thickness.  7. Left atrial size was normal.  8. Right atrial size was mildly dilated.  9. Presence of pericardial fat pad. 10. The  pericardial effusion is circumferential. 11. Trivial pericardial effusion is present. 12. Mild mitral annular calcification. 13. The mitral valve is degenerative. No evidence of mitral valve regurgitation. 14. The tricuspid valve is grossly normal. Tricuspid valve regurgitation is mild. 15. The aortic valve is tricuspid. Aortic valve regurgitation is not visualized. No evidence of aortic valve sclerosis or stenosis. 16. Pulmonic regurgitation is mild. 17. The pulmonic valve was grossly normal. Pulmonic valve regurgitation is mild. 18. Moderately elevated pulmonary artery systolic pressure. 19. The tricuspid regurgitant velocity is 3.55 m/s, and with an assumed right atrial pressure of 8 mmHg, the estimated right ventricular systolic pressure is moderately elevated at 58.4 mmHg. 20. The inferior vena cava is normal in size with <50% respiratory variability, suggesting right atrial pressure of 8 mmHg. 21. Changes from prior study are noted. 22. A prior study was performed on 04/16/2017. 23. RV severely dilated and with reduced function compared with prior.  Micro Data:  12/8 SARS2  >> negative  Antimicrobials:  n/a  Interim history/subjective:  Currently on 5L [place]   Objective   Blood pressure 132/74, pulse 93, temperature 98.2 F (36.8 C), resp. rate (!) 26, height _0  (1.575 m), weight 87.5 kg, SpO2 98 %.        Intake/Output Summary (Last 24 hours) at 01/28/2019 1740 Last data filed at 01/28/2019 1127 Gross per 24 hour  Intake -  Output 900 ml  Net -900 ml   Filed Weights   01/27/19 1141  Weight: 87.5 kg    Examination: General:  Elderly female sitting upright in bed, easily dyspneic with talking HEENT: MM pink/moist Neuro: A/O x 3, MAE CV: rr, no mumur PULM:  Mildly labored/ easily dyspneic speaking short sentences, lungs clear / diminished, no wheeze GI: obese, soft, bs active  Extremities: warm/dry, no LE edema, tenderness, erythema, or warmth Skin: no rashes    Resolved Hospital Problem list    Assessment & Plan:   Acute hypoxic respiratory failure  Acute submassive PE with severely reduced RV dysfunction by TTE with RLE DVT P:  - Patient is at high risk for decompensation related to significant RV dysfunction.  Will transfer to ICU for close monitoring.   - caution with any meds which reduce affect preload  - MAP goal >65 - Discussed with Dr. Ander Slade concerning tPA, benefits out weigh risk at this point and patient and patients daughter, [name] at bedside are aware and accept risk.  - tPA per pharmacy tPA protocol/ heparin- will consult with pharmacy for timing of tPA given her heparin level just resulted high w/recs to hold heparin x 1 hour - she will need coags/labs, 2nd IV and transfer to ICU prior to tPA initiation given this is urgent but not emergent situation.  - continue supplemental O2 for sat goal > 94-99% - will require lifelong anticoagulation    AKI P:  Insert foley w/ plans for tPA Trend BMP / urinary output Replace electrolytes as indicated Avoid nephrotoxic agents, ensure adequate renal perfusion  Hx HTN/ HLD/ HFpEF P:  CXR clear Hold home lasix and HTN meds  Caution with preload reduction given RV dysfunction   DM  P:  SSI/ CBG Hold home metformin  OSA P:  Nightly CPAP per her home machine  Best practice:  Diet: clear liquids  Pain/Anxiety/Delirium protocol (if indicated): n/a VAP protocol (if indicated): n/a DVT prophylaxis: heparin/ tPA GI prophylaxis: will add PPI  Glucose control: CBG/ SSI Mobility: BR Code Status: Full  Family Communication: patient and her daughter, [name] [phone]) updated on plan of care Disposition: ICU   Labs   CBC: Recent Labs  Lab 01/27/19 1155 01/28/19 0441  WBC 7.2 6.3  HGB 14.6 14.0  HCT 46.0 43.6  MCV 97.9 96.7  PLT 186 659    Basic Metabolic Panel: Recent Labs  Lab 01/27/19 1155 01/28/19 0441  NA 139 145  K 3.9 3.8  CL 104 104  CO2 19* 24   GLUCOSE 323* 133*  BUN 21 20  CREATININE 1.19* 1.09*  CALCIUM 9.2 9.3   GFR: Estimated Creatinine Clearance: 44.4 mL/min (A) (by C-G formula based on SCr of 1.09 mg/dL (H)). Recent Labs  Lab 01/27/19 1155 01/28/19 0441  WBC 7.2 6.3    Liver Function Tests: Recent Labs  Lab 01/28/19 0441  AST 29  ALT 19  ALKPHOS 89  BILITOT 1.3*  PROT 6.9  ALBUMIN 3.3*   No results for input(s): LIPASE, AMYLASE in the last 168 hours. No results for input(s): AMMONIA in the last 168 hours.  ABG No results found for: PHART, PCO2ART, PO2ART, HCO3, TCO2, ACIDBASEDEF, O2SAT   Coagulation Profile: No results for input(s): INR, PROTIME in the last 168 hours.  Cardiac Enzymes: No results for input(s): CKTOTAL, CKMB, CKMBINDEX, TROPONINI in the last 168 hours.  HbA1C: Hemoglobin A1C  Date/Time Value Ref Range Status  12/28/2016 6.7  Final   Hgb A1c MFr Bld  Date/Time Value Ref Range Status  11/06/2018 09:36 AM 6.8 (H) 4.6 - 6.5 % Final    Comment:    Glycemic Control Guidelines for People with Diabetes:Non Diabetic:  <6%Goal of Therapy: <7%Additional Action Suggested:  >8%   04/24/2018 09:05 AM 6.7 (H) 4.6 - 6.5 % Final    Comment:    Glycemic Control Guidelines for People with Diabetes:Non Diabetic:  <6%Goal of Therapy: <7%Additional Action Suggested:  >8%     CBG: Recent Labs  Lab 01/27/19 2057 01/28/19 0743 01/28/19 1146 01/28/19 1553  GLUCAP 135* 130* 160* 113*    Review of Systems:   As per HPI otherwise negative.   Past Medical History  She,  has a past medical history of Anxiety, Back pain, Chest pain, Decreased hearing, Depression, Diabetes ([place]), DVT (deep venous thrombosis) ([place]) (2006), Ectopic pregnancy with intrauterine pregnancy, Hiatal hernia, Hyperlipidemia, PE (pulmonary embolism) (2006), Sinus congestion, and Sleep apnea.   Surgical History    Past Surgical History:  Procedure Laterality Date  . ABDOMINAL EXPLORATION SURGERY    . BUNIONECTOMY      right  . COLONOSCOPY  2009  . ECTOPIC PREGNANCY SURGERY    . FOOT SURGERY    . TUBAL LIGATION       Social History   reports that she quit smoking about 9 years ago. Her smoking use included cigarettes. She has a 50.00 pack-year smoking history. She has never used smokeless tobacco. She reports current alcohol use. She reports that she does not use drugs.   Family History   Her family history includes Alcoholism in her father; Colon polyps in her mother; Diabetes in her maternal aunt and maternal aunt; Heart failure in her mother; Hypertension in her mother; Obesity in her  mother; Sudden death in her mother. There is no history of Colon cancer, Esophageal cancer, or Stomach cancer.   Allergies No Known Allergies   Home Medications  Prior to Admission medications   Medication Sig Start Date End Date Taking? Authorizing Provider  Alirocumab (PRALUENT) 75 MG/ML SOAJ Inject 1 pen into the skin every 14 (fourteen) days. 10/23/18  Yes Minus Breeding, MD  aspirin EC 81 MG tablet Take 81 mg by mouth at bedtime.     Yes [provider]  blood glucose meter kit and supplies KIT Dispense based on patient and insurance preference. Use up to four times daily as directed. (FOR ICD-9 250.00, 250.01). 04/08/17  Yes Lowne Lyndal Pulley R, DO  CALCIUM-VITAMIN D PO Take 600 Units by mouth 2 (two) times daily.     Yes [provider]  cholecalciferol (VITAMIN D-400) 400 UNITS TABS Take 400 Units by mouth 2 (two) times daily.    Yes [provider]  Coenzyme Q10 100 MG TABS Take 100 mg by mouth daily.   Yes [provider]  etodolac (LODINE) 400 MG tablet Take 1 tablet (400 mg total) by mouth 2 (two) times daily as needed. Patient taking differently: Take 400 mg by mouth 2 (two) times daily as needed for mild pain.  02/27/18  Yes Hilts, Legrand Como, MD  Flaxseed, Linseed, (FLAXSEED OIL PO) Take 1,000 mg by mouth daily.    Yes [provider]  furosemide (LASIX) 40 MG  tablet Take 1 tablet (40 mg total) by mouth daily. 10/02/18  Yes Minus Breeding, MD  metFORMIN (GLUCOPHAGE) 500 MG tablet Take 1 tablet (500 mg total) by mouth 2 (two) times daily with a meal. 10/02/18  Yes Minus Breeding, MD  Omega-3 Fatty Acids (FISH OIL PO) Take 1,200 mg by mouth daily.    Yes [provider]  POTASSIUM CHLORIDE PO Take 1 tablet by mouth as needed (muscle cramps).    Yes [provider]  rosuvastatin (CRESTOR) 10 MG tablet Take 1 tablet (10 mg total) by mouth daily. 10/02/18  Yes Minus Breeding, MD  TRUE METRIX BLOOD GLUCOSE TEST test strip TEST  UP  TO FOUR TIMES DAILY AS DIRECTED 12/05/17  Yes Carollee Herter, Yvonne R, DO  TRUEPLUS LANCETS 33G MISC TEST  UP TO FOUR TIMES DAILY AS DIRECTED 12/05/17  Yes Lowne Lyndal Pulley R, DO  Wheat Dextrin (BENEFIBER PO) Take 30 mLs by mouth 2 (two) times daily.   Yes [provider]  hydrocortisone (ANUSOL-HC) 2.5 % rectal cream Place 1 application rectally 2 (two) times daily. Patient not taking: Reported on 01/27/2019 11/27/18   Carollee Herter, Alferd Apa, DO  hydrocortisone (ANUSOL-HC) 25 MG suppository Place 1 suppository (25 mg total) rectally at bedtime as needed for hemorrhoids or anal itching. Patient not taking: Reported on 01/27/2019 01/02/19   Mauri Pole, MD  tiZANidine (ZANAFLEX) 2 MG tablet Take 1-2 tablets (2-4 mg total) by mouth at bedtime as needed for muscle spasms. Patient not taking: Reported on 01/27/2019 02/27/18   Hilts, Legrand Como, MD  Zoster Vaccine Adjuvanted [facility]) injection 0.58m IM x1  Repeat in 2-6 months 11/06/18   LCarollee Herter YAlferd Apa DO  omega-3 acid ethyl esters (LOVAZA) 1 G capsule Take 1 g by mouth 3 (three) times daily.    05/07/11  [provider]  pravastatin (PRAVACHOL) 20 MG tablet Take 20 mg by mouth daily.    05/07/11  [provider]     Critical care time: 55 mins  Kennieth Rad, MSN, AGACNP-BC [facility] 01/28/2019, 6:44  PM

## 2019-01-28 NOTE — ED Notes (Signed)
Tele   Breakfast ordered  

## 2019-01-28 NOTE — ED Notes (Signed)
Lab contacted and stated they are having an issue with their instrument and results of PT are delayed because of that, they will have results of PT in the chart as soon as the issue is fixed.

## 2019-01-28 NOTE — Progress Notes (Signed)
Placed patient on CPAP and patient stated that she was unable to tolerate the CPAP. Patient stated that she would be more comfortable wearing her nasal cannula. Placed patient back on 4L.

## 2019-01-28 NOTE — Progress Notes (Signed)
ANTICOAGULATION CONSULT NOTE  Pharmacy Consult for Heparin  Indication: pulmonary embolus  No Known Allergies  Patient Measurements: Height: 5\' 2"  (157.5 cm) Weight: 193 lb (87.5 kg) IBW/kg (Calculated) : 50.1 Heparin Dosing Weight: 70.1 kg  Vital Signs: BP: 113/73 (12/09 0230) Pulse Rate: 89 (12/09 0230)  Labs: Recent Labs    01/27/19 1155 01/27/19 1343 01/28/19 0218  HGB 14.6  --   --   HCT 46.0  --   --   PLT 186  --   --   HEPARINUNFRC  --   --  0.64  CREATININE 1.19*  --   --   TROPONINIHS 76* 208*  --     Estimated Creatinine Clearance: 40.7 mL/min (A) (by C-G formula based on SCr of 1.19 mg/dL (H)).   Medical History: Past Medical History:  Diagnosis Date  . Anxiety   . Back pain   . Chest pain    CLite with apical ischemia in 2006 - normal coronary arteries by [facility] in 12/2004;  Myoview 11/12:  Low risk stress nuclear study with a small, partially reversible apical defect most likely related to apical thinning; cannot R/O very mild apical ischemia.  EF: 75%   . Decreased hearing   . Depression   . Diabetes ([place])   . DVT (deep venous thrombosis) ([place]) 2006   hx of  . Ectopic pregnancy with intrauterine pregnancy   . Hiatal hernia   . Hyperlipidemia   . PE (pulmonary embolism) 2006   hx of  . Sinus congestion   . Sleep apnea    CPAP     Medications:  Scheduled:  . furosemide  40 mg Oral Daily  . insulin aspart  0-15 Units Subcutaneous TID WC  . insulin aspart  0-5 Units Subcutaneous QHS  . methylPREDNISolone acetate  40 mg Intra-articular Once  . rosuvastatin  10 mg Oral Daily  . sodium chloride flush  3 mL Intravenous Once    Assessment: Patient is a 63 yof that presents to the ED with complaints of SOB that has worsened over the course of 2 weeks. The patient was found to have a PE with signs of right heart strain on further examination. Pharmacy has been asked to dose heparin in this patient.   12/9 AM update:  Initial heparin level  therapeutic   Goal of Therapy:  Heparin level 0.3-0.7 units/ml Monitor platelets by anticoagulation protocol: Yes   Plan:  -Cont heparin at 1250 units/hr -Confirmatory heparin level at [place], PharmD, [place] Pharmacist Phone: [phone]

## 2019-01-28 NOTE — Progress Notes (Addendum)
PROGRESS NOTE  CALVIN DONICA [id number] DOB: 03-06-1940 DOA: 01/27/2019 PCP: Ann Held, DO   LOS: 1 day   Brief narrative: KAMBRIA KLIMASZEWSKI is a 78 y.o. female with medical history significant of obesity, DM, HTN, HLD presening to the ED with progressive sob. SOB first began 4-5 days prior to ED visit. Symptoms progressed and pt believed her sob was related to CHF. Pt was scheduled to see her Cardiologist on the day of ED visit, however on the morning of visit, pt developed acute worsening of SOB resulting in near syncopal episode. Pt subsequently, presented to the ED for further work up. 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 Follow up CT notable for acute PE with R heart strain. Patient was started on heparin gtt. Hospitalist service consulted for consideration for admission.  COVID-19 was negative.  Assessment/Plan:  Principal Problem:   Acute pulmonary embolism with acute cor pulmonale (HCC) Active Problems:   Hyperlipidemia LDL goal <70   Chronic diastolic heart failure (HCC)   Personal history of venous thrombosis and embolism   Obesity (BMI 30-39.9)   DM (diabetes mellitus) type II uncontrolled, periph vascular disorder ([place])   Essential hypertension   Acute hypoxemic respiratory failure (HCC)   Pulmonary emboli (HCC)  Acute hypoxic respiratory failure secondary to acute pulmonary embolism with R heart strain Still on 4 L of oxygen with dyspnea on minimal exertion.  Currently saturating 94% on 4 L of oxygen by nasal cannula.  On heparin drip, denies chest pain.   Check 2D echo pending.  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 Ultrasound of the lower extremity shows acute deep vein thrombosis involving the right femoral vein, right popliteal vein, right posterior tibial veins, right peroneal veins, and right gastrocnemius  veins.  ED provider had spoken with critical care regarding this CT scan finding with right heart strain and the recommendation was anticoagulation and no thrombolytic treatment.  Right lower extremity DVT.  On heparin drip.  Chronic diastolic CHF Chest x-ray was unremarkable.  Continue p.o. Lasix.  Obesity Lifestyle modification advised  Diabetes mellitus type 2. Continue sliding scale insulin.  A1c 6.8 on 11/06/2018.  Diabetic diet, Accu-Cheks.  Hold OHA.  Check A1c.  Essential HTN Blood pressure is stable.  Hemodynamically stable at this time.  Currently not tachycardic.  History of obstructive sleep apnea.  Resume CPAP at nighttime.  VTE Prophylaxis: Heparin drip  Code Status: Full code  Family Communication: I tried to call the patient's daughter Joelene Millin to update her about the clinical condition of the patient but was not able to reach on her phone.  Disposition Plan: Home, pending 2D echocardiogram.  Wean oxygen as tolerated.  Continue on heparin drip until 2D echocardiogram.  Continue to monitor closely.   Addendum:  01/28/2019 5:30 PM  Cardiologist notified me about the 2D echocardiogram report that there was possible saddle embolus/submassive PE with significant RV strain.  I immediately called PCCM Dr. Gala Murdoch on the phone for reconsideration of possible thrombolytic treatment on this patient and potential ICU admission.  He stated that he was going to review the patient as soon as possible.  Blood pressure is still stable.  Consultants:  None  Procedures:  None  Antibiotics: Anti-infectives (From admission, onward)   None     Subjective: Today, patient still feels dyspneic even on minimal exertion.  Denies chest pain cough or fever.  Complaints of bilateral lower extremity pain.  Objective: Vitals:   01/28/19 0645 01/28/19 0700  BP: 123/74 122/73  Pulse: 89 88  Resp:    Temp:    SpO2: 96% 96%   No intake or output data in the 24 hours ending  01/28/19 0817 Filed Weights   01/27/19 1141  Weight: 87.5 kg   Body mass index is 35.3 kg/m.   Physical Exam: GENERAL: Patient is alert awake and oriented. Not in obvious distress.  Morbidly obese, on nasal cannula 4 L/min HENT: No scleral pallor or icterus. Pupils equally reactive to light. Oral mucosa is moist NECK: is supple, no palpable thyroid enlargement. CHEST: No obvious wheezes noted.  Mildly tachypneic.  Diminished breath sounds bilaterally. CVS: S1 and S2 heard, no murmur. Regular rate and rhythm. No pericardial rub. ABDOMEN: Soft, non-tender, bowel sounds are present. No palpable hepato-splenomegaly. EXTREMITIES: Trace edema with nonspecific tenderness on palpation. CNS: Cranial nerves are intact. No focal motor or sensory deficits. SKIN: warm and dry without rashes.  Data Review: I have personally reviewed the following laboratory data and studies,  CBC: Recent Labs  Lab 01/27/19 1155 01/28/19 0441  WBC 7.2 6.3  HGB 14.6 14.0  HCT 46.0 43.6  MCV 97.9 96.7  PLT 186 [id number]   Basic Metabolic Panel: Recent Labs  Lab 01/27/19 1155 01/28/19 0441  NA 139 145  K 3.9 3.8  CL 104 104  CO2 19* 24  GLUCOSE 323* 133*  BUN 21 20  CREATININE 1.19* 1.09*  CALCIUM 9.2 9.3   Liver Function Tests: Recent Labs  Lab 01/28/19 0441  AST 29  ALT 19  ALKPHOS 89  BILITOT 1.3*  PROT 6.9  ALBUMIN 3.3*   No results for input(s): LIPASE, AMYLASE in the last 168 hours. No results for input(s): AMMONIA in the last 168 hours. Cardiac Enzymes: No results for input(s): CKTOTAL, CKMB, CKMBINDEX, TROPONINI in the last 168 hours. BNP (last 3 results) Recent Labs    03/04/18 1609  BNP 37.6    ProBNP (last 3 results) No results for input(s): PROBNP in the last 8760 hours.  CBG: Recent Labs  Lab 01/27/19 2057 01/28/19 0743  GLUCAP 135* 130*   Recent Results (from the past 240 hour(s))  SARS CORONAVIRUS 2 (TAT 6-24 HRS) Nasopharyngeal Nasopharyngeal Swab     Status:  None   Collection Time: 01/27/19  1:39 PM   Specimen: Nasopharyngeal Swab  Result Value Ref Range Status   SARS Coronavirus 2 NEGATIVE NEGATIVE Final    Comment: (NOTE) SARS-CoV-2 target nucleic acids are NOT DETECTED. The SARS-CoV-2 RNA is generally detectable in upper and lower respiratory specimens during the acute phase of infection. Negative results do not preclude SARS-CoV-2 infection, do not rule out co-infections with other pathogens, and should not be used as the sole basis for treatment or other patient management decisions. Negative results must be combined with clinical observations, patient history, and epidemiological information. The expected result is Negative. Fact Sheet for Patients: SugarRoll.be Fact Sheet for Healthcare Providers: https://www.woods-mathews.com/ This test is not yet approved or cleared by the [place] FDA and  has been authorized for detection and/or diagnosis of SARS-CoV-2 by FDA under an Emergency Use Authorization (EUA). This EUA will remain  in effect (meaning this test can be used) for the duration of the COVID-19 declaration under Section 56 4(b)(1) of the Act, 21 U.S.C. section 360bbb-3(b)(1), unless the authorization is terminated or revoked sooner. Performed at [facility], [place] [address], [place], [place] [address]      Studies: Dg Chest 2 View  Result Date: 01/27/2019 CLINICAL DATA:  Acute shortness of breath EXAM: CHEST - 2 VIEW COMPARISON:  01/29/2018 FINDINGS: The cardiomediastinal silhouette is unremarkable. Minimal subsegmental atelectasis/scarring within the UPPER RIGHT lung and LOWER LEFT lung noted. There is no evidence of focal airspace disease, pulmonary edema, suspicious pulmonary nodule/mass, pleural effusion, or pneumothorax. No acute bony abnormalities are identified. IMPRESSION: No evidence of acute cardiopulmonary disease. Electronically Signed   By: Margarette Canada M.D.    On: 01/27/2019 12:34   Ct Angio Chest Pe W/cm &/or Wo Cm  Result Date: 01/27/2019 CLINICAL DATA:  Shortness of breath EXAM: CT ANGIOGRAPHY CHEST WITH CONTRAST TECHNIQUE: Multidetector CT imaging of the chest was performed using the standard protocol during bolus administration of intravenous contrast. Multiplanar CT image reconstructions and MIPs were obtained to evaluate the vascular anatomy. CONTRAST:  169mL OMNIPAQUE IOHEXOL 350 MG/ML SOLN COMPARISON:  None. FINDINGS: Cardiovascular: Satisfactory opacification of the pulmonary arteries. Filling defects reflecting acute pulmonary emboli are present within the pulmonary arteries beginning at the bifurcation of the main pulmonary artery with additional lobar, segmental, and subsegmental involvement bilaterally. There is enlargement of the right ventricle relative to the left with straightening of the interventricular septum. Mediastinum/Nodes: No adenopathy. Lungs/Pleura: Central airways are patent. Patchy ground-glass density likely related to altered perfusion and atelectasis. No pleural effusion or pneumothorax. Upper Abdomen: Small hiatal hernia. Musculoskeletal: Mild degenerative changes of the included spine. Review of the MIP images confirms the above findings. IMPRESSION: Positive for acute PE with CT evidence of right heart strain (RV/LV Ratio = 1.7) consistent with at least submassive (intermediate risk) PE. The presence of right heart strain has been associated with an increased risk of morbidity and mortality. Please activate Code PE by paging [phone]. These results were called by telephone at the time of interpretation on 01/27/2019 at 5:01 pm to provider [facility] , who verbally acknowledged these results. Electronically Signed   By: Macy Mis M.D.   On: 01/27/2019 17:08    Scheduled Meds: . furosemide  40 mg Oral Daily  . insulin aspart  0-15 Units Subcutaneous TID WC  . insulin aspart  0-5 Units Subcutaneous QHS  .  methylPREDNISolone acetate  40 mg Intra-articular Once  . rosuvastatin  10 mg Oral Daily  . sodium chloride flush  3 mL Intravenous Once    Continuous Infusions: . sodium chloride    . heparin       [name], MD  [facility] 01/28/2019

## 2019-01-28 NOTE — ED Notes (Signed)
Attempted to give report to 2W.

## 2019-01-28 NOTE — Progress Notes (Signed)
  Echocardiogram 2D Echocardiogram has been performed.  Burnett Kanaris 01/28/2019, 3:45 PM

## 2019-01-28 NOTE — ED Notes (Signed)
Echo at bedside

## 2019-01-28 NOTE — Progress Notes (Addendum)
ANTICOAGULATION CONSULT NOTE  Pharmacy Consult for Heparin  Indication: pulmonary embolus  No Known Allergies  Patient Measurements: Height: 5\' 2"  (157.5 cm) Weight: 193 lb (87.5 kg) IBW/kg (Calculated) : 50.1 Heparin Dosing Weight: 70.1 kg  Vital Signs: Temp: 98.2 F (36.8 C) (12/09 1558) BP: 132/74 (12/09 1558) Pulse Rate: 93 (12/09 1558)  Labs: Recent Labs    01/27/19 1155 01/27/19 1343 01/28/19 0218 01/28/19 0441 01/28/19 1605  HGB 14.6  --   --  14.0  --   HCT 46.0  --   --  43.6  --   PLT 186  --   --  197  --   HEPARINUNFRC  --   --  1.34*  --  1.04*  CREATININE 1.19*  --   --  1.09*  --   TROPONINIHS 76* 208*  --   --   --     Estimated Creatinine Clearance: 44.4 mL/min (A) (by C-G formula based on SCr of 1.09 mg/dL (H)).   Medical History: Past Medical History:  Diagnosis Date  . Anxiety   . Back pain   . Chest pain    CLite with apical ischemia in 2006 - normal coronary arteries by [facility] in 12/2004;  Myoview 11/12:  Low risk stress nuclear study with a small, partially reversible apical defect most likely related to apical thinning; cannot R/O very mild apical ischemia.  EF: 75%   . Decreased hearing   . Depression   . Diabetes ([place])   . DVT (deep venous thrombosis) ([place]) 2006   hx of  . Ectopic pregnancy with intrauterine pregnancy   . Hiatal hernia   . Hyperlipidemia   . PE (pulmonary embolism) 2006   hx of  . Sinus congestion   . Sleep apnea    CPAP     Assessment: 78 yr old female presented to the ED with complaint of SOB that has worsened over the course of 2 weeks. The patient was found to have a PE with signs of right heart strain. Pt has hx of R LE DVT and PE treated with warfarin X 2 years (not on anticoagulation prior to admission). Pharmacy has been asked to dose heparin in this patient.   Heparin level initially reported as therapeutic this morning; however, lab was corrected to now show as supratherapeutic at 1.34. Heparin is running  in the right [facility] and per discussion with patient, level was drawn from her left hand - appears this is an accurate level. Heparin was held X 1 hr, then resumed at 1100 units/hr at 0908 this AM.   Heparin level drawn ~7 hrs after infusion was held X 1 hr then restarted at lower rate of 1100 units/hr was 1.04 units/ml, which still remains above the goal range for this pt. CBC WNL. Per RN, no issues with IV or bleeding observed.  Per RN, pt to be transferred to ICU S/P echo to receive TPA.  Goal of Therapy:  Heparin level 0.3-0.7 units/ml (will target lower goal of 0.3-0.5 units/ml for first 24 hrs after TPA, then use goal of 0.3-0.7 units/ml) Monitor platelets by anticoagulation protocol: Yes   Plan:  Hold heparin infusion (elevated heparin level, TPA planned when transferred to ICU this evening) Check aPTT, PT/INR,  CBC 30 mins after TPA completion Restart heparin at 850 units/hr (no bolus) when aPTT <80 sec (if aPTT =/> 80 sec, recheck every 2 hrs until <80 sec) Check 8-hr heparin level after heparin drip resumed Monitor daily heparin level,  CBC Monitor for signs/symptoms of bleeding  Gillermina Hu, PharmD, BCPS, [facility] Clinical Pharmacist 01/28/2019 6:41 PM

## 2019-01-29 DIAGNOSIS — E1151 Type 2 diabetes mellitus with diabetic peripheral angiopathy without gangrene: Secondary | ICD-10-CM | POA: Diagnosis not present

## 2019-01-29 DIAGNOSIS — I2602 Saddle embolus of pulmonary artery with acute cor pulmonale: Secondary | ICD-10-CM | POA: Diagnosis not present

## 2019-01-29 DIAGNOSIS — G4733 Obstructive sleep apnea (adult) (pediatric): Secondary | ICD-10-CM | POA: Diagnosis not present

## 2019-01-29 DIAGNOSIS — E1165 Type 2 diabetes mellitus with hyperglycemia: Secondary | ICD-10-CM

## 2019-01-29 DIAGNOSIS — J9601 Acute respiratory failure with hypoxia: Secondary | ICD-10-CM | POA: Diagnosis not present

## 2019-01-29 DIAGNOSIS — Z9989 Dependence on other enabling machines and devices: Secondary | ICD-10-CM

## 2019-01-29 LAB — APTT: aPTT: 43 seconds — ABNORMAL HIGH (ref 24–36)

## 2019-01-29 LAB — BASIC METABOLIC PANEL
Anion gap: 14 (ref 5–15)
BUN: 23 mg/dL (ref 8–23)
CO2: 25 mmol/L (ref 22–32)
Calcium: 8.7 mg/dL — ABNORMAL LOW (ref 8.9–10.3)
Chloride: 103 mmol/L (ref 98–111)
Creatinine, Ser: 1.1 mg/dL — ABNORMAL HIGH (ref 0.44–1.00)
GFR calc Af Amer: 56 mL/min — ABNORMAL LOW (ref >60)
GFR calc non Af Amer: 48 mL/min — ABNORMAL LOW (ref >60)
Glucose, Bld: 141 mg/dL — ABNORMAL HIGH (ref 70–99)
Potassium: 3.5 mmol/L (ref 3.5–5.1)
Sodium: 142 mmol/L (ref 135–145)

## 2019-01-29 LAB — CBC
HCT: 41.3 % (ref 36.0–46.0)
Hemoglobin: 13.9 g/dL (ref 12.0–15.0)
MCH: 31.4 pg (ref 26.0–34.0)
MCHC: 33.7 g/dL (ref 30.0–36.0)
MCV: 93.4 fL (ref 80.0–100.0)
Platelets: 183 10*3/uL (ref 150–400)
RBC: 4.42 MIL/uL (ref 3.87–5.11)
RDW: 14.1 % (ref 11.5–15.5)
WBC: 7.6 10*3/uL (ref 4.0–10.5)
nRBC: 0 % (ref 0.0–0.2)

## 2019-01-29 LAB — GLUCOSE, CAPILLARY
Glucose-Capillary: 107 mg/dL — ABNORMAL HIGH (ref 70–99)
Glucose-Capillary: 117 mg/dL — ABNORMAL HIGH (ref 70–99)
Glucose-Capillary: 118 mg/dL — ABNORMAL HIGH (ref 70–99)
Glucose-Capillary: 128 mg/dL — ABNORMAL HIGH (ref 70–99)
Glucose-Capillary: 142 mg/dL — ABNORMAL HIGH (ref 70–99)
Glucose-Capillary: 143 mg/dL — ABNORMAL HIGH (ref 70–99)

## 2019-01-29 LAB — PROTIME-INR
INR: 2 — ABNORMAL HIGH (ref 0.8–1.2)
Prothrombin Time: 22.8 seconds — ABNORMAL HIGH (ref 11.4–15.2)

## 2019-01-29 LAB — MAGNESIUM: Magnesium: 1.9 mg/dL (ref 1.7–2.4)

## 2019-01-29 LAB — HEPARIN LEVEL (UNFRACTIONATED): Heparin Unfractionated: 0.36 IU/mL (ref 0.30–0.70)

## 2019-01-29 MED ORDER — HEPARIN (PORCINE) 25000 UT/250ML-% IV SOLN
850.0000 [IU]/h | INTRAVENOUS | Status: DC
  Administered 2019-01-29: 07:00:00 850 [IU]/h via INTRAVENOUS
  Filled 2019-01-29: qty 250

## 2019-01-29 NOTE — Progress Notes (Signed)
eLink Physician-Brief Progress Note Patient Name: Sierra Ford DOB: 08/10/1940 MRN: [id number]   Date of Service  01/29/2019  HPI/Events of Note  RN requesting order downgrading Pt to progressive care status  eICU Interventions  Progressive care status order entered.        [name] Feleica Fulmore 01/29/2019, 9:14 PM

## 2019-01-29 NOTE — Progress Notes (Addendum)
2 RNs attempted to draw blood off of PIV for post TPA labs, and unable to draw back. Elink MD made aware, and agreed to a 1 time lab stick. Phlebotomy notified. Will continue to monitor.

## 2019-01-29 NOTE — Progress Notes (Signed)
ANTICOAGULATION CONSULT NOTE  Pharmacy Consult for Heparin  Indication: pulmonary embolus  No Known Allergies  Patient Measurements: Height: 5\' 2"  (157.5 cm) Weight: 195 lb 15.8 oz (88.9 kg) IBW/kg (Calculated) : 50.1 Heparin Dosing Weight: 70.1 kg  Vital Signs: Temp: 97.9 F (36.6 C) (12/10 1156) Temp Source: Oral (12/10 1156) BP: 123/62 (12/10 1500) Pulse Rate: 80 (12/10 1500)  Labs: Recent Labs    01/27/19 1155 01/27/19 1343 01/28/19 0218 01/28/19 0441 01/28/19 1605 01/29/19 0326 01/29/19 1526  HGB 14.6  --   --  14.0  --  13.9  --   HCT 46.0  --   --  43.6  --  41.3  --   PLT 186  --   --  197  --  183  --   APTT  --   --   --   --   --  43*  --   LABPROT  --   --   --   --   --  22.8*  --   INR  --   --   --   --   --  2.0*  --   HEPARINUNFRC  --   --  1.34*  --  1.04*  --  0.36  CREATININE 1.19*  --   --  1.09*  --  1.10*  --   TROPONINIHS 76* 208*  --   --   --   --   --     Estimated Creatinine Clearance: 44.4 mL/min (A) (by C-G formula based on SCr of 1.1 mg/dL (H)).   Medical History: Past Medical History:  Diagnosis Date  . Anxiety   . Back pain   . Chest pain    CLite with apical ischemia in 2006 - normal coronary arteries by [facility] in 12/2004;  Myoview 11/12:  Low risk stress nuclear study with a small, partially reversible apical defect most likely related to apical thinning; cannot R/O very mild apical ischemia.  EF: 75%   . Decreased hearing   . Depression   . Diabetes ([place])   . DVT (deep venous thrombosis) ([place]) 2006   hx of  . Ectopic pregnancy with intrauterine pregnancy   . Hiatal hernia   . Hyperlipidemia   . PE (pulmonary embolism) 2006   hx of  . Sinus congestion   . Sleep apnea    CPAP     Assessment: 78 yr old female presented to the ED with complaint of SOB that has worsened over the course of 2 weeks. The patient was found to have a PE with signs of right heart strain. Pt has hx of R LE DVT and PE treated with warfarin X 2  years (not on anticoagulation prior to admission). Pharmacy has been asked to dose heparin in this patient.   Pt was transferred to ICU S/P echo to receive tPA last evening..  Heparin infusion was restarted at 850 units/hr this AM when aPTT < 80 seconds S/P tPA. Heparin level drawn ~8 hrs after restarting infusion was 0.36 units/ml, which is within the goal range for this pt. CBC WNL. Per RN, no issues with IV or bleeding observed.  Goal of Therapy:  Heparin level 0.3-0.7 units/ml (will target lower goal of 0.3-0.5 units/ml for first 24 hrs after TPA, then use goal of 0.3-0.7 units/ml) Monitor platelets by anticoagulation protocol: Yes   Plan:  Continue heparin infusion at 850 units/hr Check 8-hr confirmatory heparin level Monitor daily heparin level, CBC Monitor for signs/symptoms  of bleeding  Gillermina Hu, PharmD, BCPS, [facility] Clinical Pharmacist 01/29/2019 4:27 PM

## 2019-01-29 NOTE — Progress Notes (Signed)
NAME:  Sierra Ford, MRN:  [id number], DOB:  12-21-1940, LOS: 2 ADMISSION DATE:  01/27/2019, CONSULTATION DATE:  01/28/2019 REFERRING MD:  Dr. Louanne Belton, CHIEF COMPLAINT:  PE w/right heart strain  Brief History   13 yoF presenting with progressive 4-5 SOB with near syncopal episode found to have acute PE, RLE DVT, and TTE with significant RV dysfunction.  Has remained hemodynamically stable and on minimal O2 .   PCCM consulted given significant TTE findings.   History of present illness   78 year old female with history of RLE DVT and PE in 2006 s/p coumadin for 2 years, DM, HTN, HLD, former smoker (quit over 8 years ago), obesity who presented on 12/8 with progressive SOB with near syncopal episode.    Patient reports PE and RLE DVT in 2006 which at that time was attributed to her smoking and estrogen therapy.  She was on coumadin for 2 years.  Patient reports she is up to date on her cancer screenings and denies recent travel.  Only complaints of SOB and some dizzy spells but denies any syncopal episodes, chest pain, falls, recent bleeding events - other than some hemorrhoidal spotting several weeks ago when she wiped only had small amount of blood.  States she noticed getting dyspneic around Thanksgiving but much more pronounced over the last 4-5 days. No lower leg swelling or pain.     In ER, CXR unremarkable.  CTA chest PE noted for acute submassive PE with RV/LV ratio 1.7.  She has been hemodynamically stable and on minimal O2 requirement.  She was started on heparin and admitted to hospitalist service. DVT of LE positive for DVT in the right femoral vein, right popliteal vein, right posterior tibial veins, right peroneal veins, and right gastrocnemius veins.  TTE completed today showing severely dilated RV with severely reduced RV function with flattened septum consistent with RV pressure overload; LVEF 55-60%.  Since admission, patient reports her breathing/ SOB is somewhat better.  PCCM  consulted given significant TTE findings of RV dysfunction.   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   12/9 LE VAS duplex DVT >> Right: Findings consistent with acute deep vein thrombosis involving the right femoral vein, right popliteal vein, right posterior tibial veins, right peroneal veins, and right gastrocnemius veins. No cystic structure found in the popliteal fossa. Left: There is no evidence of deep vein thrombosis in the lower extremity. However, portions of this examination were limited- see technologist comments above. No cystic structure found in the popliteal fossa.  12/9 TTE >>  1. Severely dilated RV with severely reduced function. Septum is flattened in systole/diastole consistent with RV pressure overload. RVSP is moderately elevated on this study. Findings consistent with submassive PE on review of medical record and CTA  chest. Findings communicated to [name], MD @ 5:25 PM.  2. Left ventricular ejection fraction, by visual estimation, is 55 to 60%. The left ventricle has normal function. There is no left ventricular hypertrophy.  3. Definity contrast agent was given IV to delineate the left ventricular endocardial borders.  4. Right ventricular volume and pressure overload.  5. The left ventricle demonstrates regional wall motion abnormalities.  6. Global right ventricle has severely reduced systolic function.The right ventricular size is severely enlarged. No increase in right ventricular wall thickness.  7. Left atrial size was normal.  8. Right atrial size was mildly dilated.  9. Presence of pericardial fat pad. 10. The  pericardial effusion is circumferential. 11. Trivial pericardial effusion is present. 12. Mild mitral annular calcification. 13. The mitral valve is degenerative. No evidence of mitral valve regurgitation. 14. The tricuspid valve is grossly normal. Tricuspid valve regurgitation is mild. 15. The aortic valve is tricuspid. Aortic valve regurgitation is not visualized. No evidence of aortic valve sclerosis or stenosis. 16. Pulmonic regurgitation is mild. 17. The pulmonic valve was grossly normal. Pulmonic valve regurgitation is mild. 18. Moderately elevated pulmonary artery systolic pressure. 19. The tricuspid regurgitant velocity is 3.55 m/s, and with an assumed right atrial pressure of 8 mmHg, the estimated right ventricular systolic pressure is moderately elevated at 58.4 mmHg. 20. The inferior vena cava is normal in size with <50% respiratory variability, suggesting right atrial pressure of 8 mmHg. 21. Changes from prior study are noted. 22. A prior study was performed on 04/16/2017. 23. RV severely dilated and with reduced function compared with prior.  Micro Data:  12/8 SARS2  >> negative  Antimicrobials:  n/a  Interim history/subjective:  Down to 3L and comfortable with no complaints Feels better this AM   Objective   Blood pressure 115/69, pulse 78, temperature 98 F (36.7 C), temperature source Oral, resp. rate (!) 23, height 5\' 2"  (1.575 m), weight 88.9 kg, SpO2 97 %.        Intake/Output Summary (Last 24 hours) at 01/29/2019 [id number] Last data filed at 01/29/2019 0900 Gross per 24 hour  Intake 192.37 ml  Output 1400 ml  Net -1207.63 ml   Filed Weights   01/27/19 1141 01/28/19 1900  Weight: 87.5 kg 88.9 kg    Examination: General:  Well appearing, NAD HEENT: [place]/AT, PERRL, EOM-I and MMM Neuro: Alert and interactive, moving all ext to command CV: RRR, Nl S1/S2 and -M/R/G PULM:  Diminished but clear bilaterally GI: Soft, NT, ND and +BS Extremities: warm/dry, no LE  edema, tenderness, erythema, or warmth Skin: no rashes   I reviewed chest CT myself, PE noted  Discussed with PCCM-NP and TRH-MD  Resolved Hospital Problem list    Assessment & Plan:   Acute hypoxic respiratory failure  Acute submassive PE with severely reduced RV dysfunction by TTE with RLE DVT P:  - Continue heparin - May transition to PO once able to take more PO medications - KVO IVF - Tele monitoring - Transfer out of the ICU - PT/OT - OOB - Ambulate as able - Continue supplemental O2 for sat goal > 94-99% - Will require lifelong anticoagulation   AKI P:  Insert foley w/ plans for tPA Trend BMP / urinary output Replace electrolytes as indicated Avoid nephrotoxic agents, ensure adequate renal perfusion  Hx HTN/ HLD/ HFpEF P:  CXR clear Hold home lasix and HTN meds  Caution with preload reduction given RV dysfunction   DM  P:  SSI/ CBG Hold home metformin  OSA P:  Nightly CPAP, may use home machine  Transfer to progressive care, out of the ICU and back to [facility] service with PCCM off 12/11  Best practice:  Diet: clear liquids  Pain/Anxiety/Delirium protocol (if indicated): n/a VAP protocol (if indicated): n/a DVT prophylaxis: heparin/ tPA GI prophylaxis: will add PPI  Glucose control: CBG/ SSI Mobility: BR Code Status: Full  Family Communication: patient and her daughter, [name] [phone]) updated on plan of care Disposition: ICU   Labs   CBC: Recent Labs  Lab 01/27/19 1155 01/28/19 0441 01/29/19 0326  WBC 7.2 6.3 7.6  HGB 14.6 14.0 13.9  HCT 46.0 43.6 41.3  MCV 97.9 96.7 93.4  PLT 186 197 [id number]   Basic Metabolic Panel: Recent Labs  Lab 01/27/19 1155 01/28/19 0441 01/29/19 0326  NA 139 145 142  K 3.9 3.8 3.5  CL 104 104 103  CO2 19* 24 25  GLUCOSE 323* 133* 141*  BUN 21 20 23   CREATININE 1.19* 1.09* 1.10*  CALCIUM 9.2 9.3 8.7*  MG  --   --  1.9   GFR: Estimated Creatinine Clearance: 44.4 mL/min (A) (by C-G formula based on SCr  of 1.1 mg/dL (H)). Recent Labs  Lab 01/27/19 1155 01/28/19 0441 01/29/19 0326  WBC 7.2 6.3 7.6   Liver Function Tests: Recent Labs  Lab 01/28/19 0441  AST 29  ALT 19  ALKPHOS 89  BILITOT 1.3*  PROT 6.9  ALBUMIN 3.3*   No results for input(s): LIPASE, AMYLASE in the last 168 hours. No results for input(s): AMMONIA in the last 168 hours.  ABG No results found for: PHART, PCO2ART, PO2ART, HCO3, TCO2, ACIDBASEDEF, O2SAT   Coagulation Profile: Recent Labs  Lab 01/29/19 0326  INR 2.0*    Cardiac Enzymes: No results for input(s): CKTOTAL, CKMB, CKMBINDEX, TROPONINI in the last 168 hours.  HbA1C: Hemoglobin A1C  Date/Time Value Ref Range Status  12/28/2016 12:00 AM 6.7  Final   Hgb A1c MFr Bld  Date/Time Value Ref Range Status  11/06/2018 09:36 AM 6.8 (H) 4.6 - 6.5 % Final    Comment:    Glycemic Control Guidelines for People with Diabetes:Non Diabetic:  <6%Goal of Therapy: <7%Additional Action Suggested:  >8%   04/24/2018 09:05 AM 6.7 (H) 4.6 - 6.5 % Final    Comment:    Glycemic Control Guidelines for People with Diabetes:Non Diabetic:  <6%Goal of Therapy: <7%Additional Action Suggested:  >8%    CBG: Recent Labs  Lab 01/28/19 1553 01/28/19 1931 01/28/19 2359 01/29/19 0458 01/29/19 0734  GLUCAP 113* 137* 118* 143* 128*   Rush Farmer, M.D. [facility] Pulmonary/Critical Care Medicine.

## 2019-01-29 NOTE — Progress Notes (Signed)
ANTICOAGULATION CONSULT NOTE  Pharmacy Consult for Heparin  Indication: pulmonary embolus  No Known Allergies  Patient Measurements: Height: 5\' 2"  (157.5 cm) Weight: 195 lb 15.8 oz (88.9 kg) IBW/kg (Calculated) : 50.1 Heparin Dosing Weight: 70.1 kg  Vital Signs: Temp: 98 F (36.7 C) (12/10 0400) Temp Source: Oral (12/10 0400) BP: 124/86 (12/10 0530) Pulse Rate: 80 (12/10 0530)  Labs: Recent Labs    01/27/19 1155 01/27/19 1343 01/28/19 0218 01/28/19 0441 01/28/19 1605 01/29/19 0326  HGB 14.6  --   --  14.0  --  13.9  HCT 46.0  --   --  43.6  --  41.3  PLT 186  --   --  197  --  183  APTT  --   --   --   --   --  43*  LABPROT  --   --   --   --   --  22.8*  INR  --   --   --   --   --  2.0*  HEPARINUNFRC  --   --  1.34*  --  1.04*  --   CREATININE 1.19*  --   --  1.09*  --  1.10*  TROPONINIHS 76* 208*  --   --   --   --     Estimated Creatinine Clearance: 44.4 mL/min (A) (by C-G formula based on SCr of 1.1 mg/dL (H)).   Medical History: Past Medical History:  Diagnosis Date  . Anxiety   . Back pain   . Chest pain    CLite with apical ischemia in 2006 - normal coronary arteries by [facility] in 12/2004;  Myoview 11/12:  Low risk stress nuclear study with a small, partially reversible apical defect most likely related to apical thinning; cannot R/O very mild apical ischemia.  EF: 75%   . Decreased hearing   . Depression   . Diabetes ([place])   . DVT (deep venous thrombosis) ([place]) 2006   hx of  . Ectopic pregnancy with intrauterine pregnancy   . Hiatal hernia   . Hyperlipidemia   . PE (pulmonary embolism) 2006   hx of  . Sinus congestion   . Sleep apnea    CPAP     Assessment: 78 yr old female presented to the ED with complaint of SOB that has worsened over the course of 2 weeks. The patient was found to have a PE with signs of right heart strain. Pt has hx of R LE DVT and PE treated with warfarin X 2 years (not on anticoagulation prior to admission). Pharmacy has  been asked to dose heparin in this patient.   Per RN, pt to be transferred to ICU S/P echo to receive TPA.  APTT now < 80 seconds s/p tPA, will start heparin gtt  Goal of Therapy:  Heparin level 0.3-0.7 units/ml (will target lower goal of 0.3-0.5 units/ml for first 24 hrs after TPA, then use goal of 0.3-0.7 units/ml) Monitor platelets by anticoagulation protocol: Yes   Plan:  Heparin gtt at 850 units/hr, no bolus F/u 8 hour heparin level  Bertis Ruddy, PharmD Clinical Pharmacist Please check AMION for all [place] numbers 01/29/2019 6:49 AM

## 2019-01-29 NOTE — Progress Notes (Signed)
Called lab to check status of unresulted PTINR and APTT.

## 2019-01-30 LAB — CBC
HCT: 38.2 % (ref 36.0–46.0)
Hemoglobin: 12.9 g/dL (ref 12.0–15.0)
MCH: 31.8 pg (ref 26.0–34.0)
MCHC: 33.8 g/dL (ref 30.0–36.0)
MCV: 94.1 fL (ref 80.0–100.0)
Platelets: 175 10*3/uL (ref 150–400)
RBC: 4.06 MIL/uL (ref 3.87–5.11)
RDW: 13.9 % (ref 11.5–15.5)
WBC: 5.6 10*3/uL (ref 4.0–10.5)
nRBC: 0 % (ref 0.0–0.2)

## 2019-01-30 LAB — GLUCOSE, CAPILLARY
Glucose-Capillary: 112 mg/dL — ABNORMAL HIGH (ref 70–99)
Glucose-Capillary: 114 mg/dL — ABNORMAL HIGH (ref 70–99)
Glucose-Capillary: 116 mg/dL — ABNORMAL HIGH (ref 70–99)
Glucose-Capillary: 128 mg/dL — ABNORMAL HIGH (ref 70–99)
Glucose-Capillary: 136 mg/dL — ABNORMAL HIGH (ref 70–99)
Glucose-Capillary: 154 mg/dL — ABNORMAL HIGH (ref 70–99)

## 2019-01-30 LAB — HEPARIN LEVEL (UNFRACTIONATED): Heparin Unfractionated: 0.56 IU/mL (ref 0.30–0.70)

## 2019-01-30 MED ORDER — APIXABAN 5 MG PO TABS: 5.0000 mg | ORAL_TABLET | Freq: Two times a day (BID) | ORAL | Status: DC

## 2019-01-30 MED ORDER — APIXABAN 5 MG PO TABS
10.0000 mg | ORAL_TABLET | Freq: Two times a day (BID) | ORAL | Status: DC
  Administered 2019-01-30 – 2019-01-31 (×3): 10 mg via ORAL
  Filled 2019-01-30 (×3): qty 2

## 2019-01-30 NOTE — Care Management Important Message (Signed)
Important Message  Patient Details  Name: Sierra Ford MRN: [id number] Date of Birth: 10/27/1940   Medicare Important Message Given:  Yes     Orbie Pyo 01/30/2019, 2:24 PM

## 2019-01-30 NOTE — Progress Notes (Signed)
Pt able to place self on home CPAP machine without assistance. Pt settings are her home settings. RT will continue to monitor.

## 2019-01-30 NOTE — Progress Notes (Signed)
Patient Saturations on Room Air at Rest = 99%  Patient Saturations on [employer] while Ambulating = 98%  Paulene Floor, RN

## 2019-01-30 NOTE — Progress Notes (Signed)
ANTICOAGULATION CONSULT NOTE - Initial Consult  Pharmacy Consult for Eliquis Indication: pulmonary embolus  No Known Allergies  Patient Measurements: Height: '5\' 2"'  (157.5 cm) Weight: 198 lb 6.6 oz (90 kg) IBW/kg (Calculated) : 50.1  Vital Signs: Temp: 97.7 F (36.5 C) (12/11 0733) Temp Source: Oral (12/11 0733) BP: 116/61 (12/11 0733) Pulse Rate: 75 (12/11 0733)  Labs: Recent Labs    01/27/19 1155 01/27/19 1343 01/28/19 0218 01/28/19 0441 01/28/19 1605 01/29/19 0326 01/29/19 1526 01/30/19 0302  HGB 14.6  --    < > 14.0  --  13.9  --  12.9  HCT 46.0  --   --  43.6  --  41.3  --  38.2  PLT 186  --   --  197  --  183  --  175  APTT  --   --   --   --   --  43*  --   --   LABPROT  --   --   --   --   --  22.8*  --   --   INR  --   --   --   --   --  2.0*  --   --   HEPARINUNFRC  --   --   --   --  1.04*  --  0.36 0.56  CREATININE 1.19*  --   --  1.09*  --  1.10*  --   --   TROPONINIHS 76* 208*  --   --   --   --   --   --    < > = values in this interval not displayed.    Estimated Creatinine Clearance: 44.7 mL/min (A) (by C-G formula based on SCr of 1.1 mg/dL (H)).   Medical History: Past Medical History:  Diagnosis Date  . Anxiety   . Back pain   . Chest pain    CLite with apical ischemia in 2006 - normal coronary arteries by [facility] in 12/2004;  Myoview 11/12:  Low risk stress nuclear study with a small, partially reversible apical defect most likely related to apical thinning; cannot R/O very mild apical ischemia.  EF: 75%   . Decreased hearing   . Depression   . Diabetes ([place])   . DVT (deep venous thrombosis) ([place]) 2006   hx of  . Ectopic pregnancy with intrauterine pregnancy   . Hiatal hernia   . Hyperlipidemia   . PE (pulmonary embolism) 2006   hx of  . Sinus congestion   . Sleep apnea    CPAP     Medications:  Facility-Administered Medications Prior to Admission  Medication Dose Route Frequency Provider Last Rate Last Admin  . 0.9 %  sodium  chloride infusion  500 mL Intravenous Once Nandigam, Kavitha V, MD      . methylPREDNISolone acetate (DEPO-MEDROL) injection 40 mg  40 mg Intra-articular Once Hilts, Michael, MD       Medications Prior to Admission  Medication Sig Dispense Refill Last Dose  . Alirocumab (PRALUENT) 75 MG/ML SOAJ Inject 1 pen into the skin every 14 (fourteen) days. 2 pen 11 01/24/2019 at Unknown time  . aspirin EC 81 MG tablet Take 81 mg by mouth at bedtime.     01/26/2019 at Unknown time  . blood glucose meter kit and supplies KIT Dispense based on patient and insurance preference. Use up to four times daily as directed. (FOR ICD-9 250.00, 250.01). 1 each 0   . CALCIUM-VITAMIN D PO  Take 600 Units by mouth 2 (two) times daily.     01/27/2019 at Unknown time  . cholecalciferol (VITAMIN D-400) 400 UNITS TABS Take 400 Units by mouth 2 (two) times daily.    01/27/2019 at Unknown time  . Coenzyme Q10 100 MG TABS Take 100 mg by mouth daily.   01/26/2019 at Unknown time  . etodolac (LODINE) 400 MG tablet Take 1 tablet (400 mg total) by mouth 2 (two) times daily as needed. (Patient taking differently: Take 400 mg by mouth 2 (two) times daily as needed for mild pain. ) 60 tablet 3 unknown at unknown  . Flaxseed, Linseed, (FLAXSEED OIL PO) Take 1,000 mg by mouth daily.    01/27/2019 at Unknown time  . furosemide (LASIX) 40 MG tablet Take 1 tablet (40 mg total) by mouth daily. 90 tablet 2 01/26/2019 at Unknown time  . metFORMIN (GLUCOPHAGE) 500 MG tablet Take 1 tablet (500 mg total) by mouth 2 (two) times daily with a meal. 180 tablet 2 01/26/2019 at Unknown time  . Omega-3 Fatty Acids (FISH OIL PO) Take 1,200 mg by mouth daily.    01/27/2019 at Unknown time  . POTASSIUM CHLORIDE PO Take 1 tablet by mouth as needed (muscle cramps).    01/26/2019 at Unknown time  . rosuvastatin (CRESTOR) 10 MG tablet Take 1 tablet (10 mg total) by mouth daily. 90 tablet 2 01/26/2019 at Unknown time  . TRUE METRIX BLOOD GLUCOSE TEST test strip TEST  UP  TO  FOUR TIMES DAILY AS DIRECTED 200 each 12   . TRUEPLUS LANCETS 33G MISC TEST  UP TO FOUR TIMES DAILY AS DIRECTED 200 each 12   . Wheat Dextrin (BENEFIBER PO) Take 30 mLs by mouth 2 (two) times daily.   01/26/2019 at Unknown time  . hydrocortisone (ANUSOL-HC) 2.5 % rectal cream Place 1 application rectally 2 (two) times daily. (Patient not taking: Reported on 01/27/2019) 30 g 0 Not Taking at Unknown time  . hydrocortisone (ANUSOL-HC) 25 MG suppository Place 1 suppository (25 mg total) rectally at bedtime as needed for hemorrhoids or anal itching. (Patient not taking: Reported on 01/27/2019) 12 suppository 1 Not Taking at Unknown time  . tiZANidine (ZANAFLEX) 2 MG tablet Take 1-2 tablets (2-4 mg total) by mouth at bedtime as needed for muscle spasms. (Patient not taking: Reported on 01/27/2019) 60 tablet 1 Not Taking at Unknown time  . Zoster Vaccine Adjuvanted [facility]) injection 0.72m IM x1  Repeat in 2-6 months 0.5 mL 1    Scheduled:  . Chlorhexidine Gluconate Cloth  6 each Topical Q0600  . insulin aspart  0-9 Units Subcutaneous Q4H  . pantoprazole (PROTONIX) IV  40 mg Intravenous Q24H  . rosuvastatin  10 mg Oral Daily  . sodium chloride flush  3 mL Intravenous Once   Infusions:  . heparin 850 Units/hr (01/29/19 1800)    Assessment: 76yofemale on heparin for PE/DVT s/p tPA infusion to transition to oral anticoagulation.  Plan:  At 10a will d/c heparin and start Eliquis 110mPO BID x7d then 50m70mO BID.  VerWynona NeatharmD, BCPS  01/30/2019,8:14 AM

## 2019-01-30 NOTE — TOC Benefit Eligibility Note (Signed)
Transition of Care [facility]) Benefit Eligibility Note    Patient Details  Name: Sierra Ford MRN: [id number] Date of Birth: 29-Jun-1940   Medication/Dose: Arne Cleveland  5 MG BID  Covered?: Yes  Tier: 3 Drug  Prescription Coverage Preferred Pharmacy: [facility] with Person/Company/Phone Number:: JACELYN @ [employer] [facility] # [phone]  Co-Pay: $ 47.00  Prior Approval: No  Deductible: Met       Memory Argue Phone Number: 01/30/2019, 11:09 AM

## 2019-01-30 NOTE — Progress Notes (Signed)
PROGRESS NOTE  Sierra Ford [id number] DOB: 02-04-1941 DOA: 01/27/2019 PCP: Ann Held, DO   LOS: 3 days   Brief narrative: Sierra Ford is a 78 y.o. female with medical history significant of obesity, DM, HTN, HLD preseted to the ED with progressive sob.  Shortness of breath first began 4-5 days prior to ED visit. Symptoms progressed and patient believed her sob was related to CHF. Pt was scheduled to see her Cardiologist on the day of ED visit, however on the morning of visit, pt developed acute worsening of SOB resulting in near syncopal episode. Pt subsequently, presented to the ED for further work up. Patient has prior hx of Right LE DVT and PE treated with coumadin x 69yrs  ED Course: In the ED, patient noted to have unremarkable CXR. Follow up CT notable for acute PE with R heart strain. Patient was started on heparin gtt. Hospitalist service consulted for consideration for admission.  COVID-19 was negative.  Assessment/Plan:  Principal Problem:   Acute pulmonary embolism with acute cor pulmonale (HCC) Active Problems:   Hyperlipidemia LDL goal <70   Chronic diastolic heart failure (HCC)   Personal history of venous thrombosis and embolism   Obesity (BMI 30-39.9)   DM (diabetes mellitus) type II uncontrolled, periph vascular disorder ([place])   Essential hypertension   Acute hypoxemic respiratory failure (HCC)   Pulmonary emboli (HCC)  Acute hypoxic respiratory failure secondary to acute pulmonary embolism with R heart strain Patient remained symptomatic during hospitalization requiring almost 4 L of oxygen.  Subsequently 2D echocardiogram was performed which showed the significant RV strain.  Critical care was consulted and patient was subsequently transferred to the ICU for thrombolytic treatment.  Status post thrombolytic treatment patient has overall significantly better.  Still on oxygen today.  She feels better but is still slightly dyspneic.  Has not  ambulated.   Ultrasound of the lower extremity shows acute deep vein thrombosis involving the right femoral vein, right popliteal vein, right posterior tibial veins, right peroneal veins, and right gastrocnemius veins.  Will discontinue heparin drip and change the patient on Eliquis today.  We will continue to wean oxygen as tolerated.  Will get PT for evaluation and ambulate the patient.  Closely monitor after thrombolytics.  Neurochecks.  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  Patient is aware of the risk versus benefits of anticoagulation.  Right lower extremity DVT.  We will switch to Eliquis from heparin drip.  Chronic diastolic CHF Chest x-ray was unremarkable.  Continue p.o. Lasix.  Obesity Lifestyle modification advised  Diabetes mellitus type 2. Continue sliding scale insulin.  A1c 6.8 on 11/06/2018.  Diabetic diet, Accu-Cheks.  Hold OHA.    Essential HTN Blood pressure remained stable though patient was slightly tachycardic on presentation..Tachycardia subsequently improved after thrombolytics.  History of obstructive sleep apnea.  Resume CPAP at nighttime.  VTE Prophylaxis: Heparin drip  Code Status: Full code  Family Communication: I  called the patient's daughter Sierra Ford and updated her about the clinical condition of the patient.  Disposition Plan: Home likely by tomorrow.  Wean oxygen as tolerated.  Change heparin drip to Eliquis.  PT evaluation.  Ambulate as tolerated with pulse ox on ambulation.    Consultants:  Critical care  Procedures: Thrombolytic treatment status post TPA  Antibiotics: Anti-infectives (From admission, onward)   None     Subjective: Transferred from the ICU today.  Today, patient feels much better.  Has not ambulated.  Has less pain in the leg.  Breathing has overall improved.  Was on 3 L of oxygen.    Objective: Vitals:   01/30/19 0733 01/30/19 1117  BP: 116/61 122/69  Pulse: 75 77   Resp: (!) 25 (!) 23  Temp: 97.7 F (36.5 C) 98.2 F (36.8 C)  SpO2: 95% 97%    Intake/Output Summary (Last 24 hours) at 01/30/2019 1604 Last data filed at 01/29/2019 1800 Gross per 24 hour  Intake 17 ml  Output --  Net 17 ml   Filed Weights   01/27/19 1141 01/28/19 1900 01/29/19 2017  Weight: 87.5 kg 88.9 kg 90 kg   Body mass index is 36.29 kg/m.   Physical Exam: GENERAL: Patient is alert awake and oriented. Not in obvious distress.  Morbidly obese, on nasal cannula 3 L/min HENT: No scleral pallor or icterus. Pupils equally reactive to light. Oral mucosa is moist NECK: is supple, no palpable thyroid enlargement. CHEST: No obvious wheezes noted.  Mildly tachypneic.  Diminished breath sounds bilaterally. CVS: S1 and S2 heard, no murmur. Regular rate and rhythm. No pericardial rub. ABDOMEN: Soft, non-tender, bowel sounds are present. No palpable hepato-splenomegaly. EXTREMITIES: Trace edema with nonspecific tenderness on palpation. CNS: Cranial nerves are intact. No focal motor or sensory deficits. SKIN: warm and dry without rashes.  Data Review: I have personally reviewed the following laboratory data and studies,  CBC: Recent Labs  Lab 01/27/19 1155 01/28/19 0441 01/29/19 0326 01/30/19 0302  WBC 7.2 6.3 7.6 5.6  HGB 14.6 14.0 13.9 12.9  HCT 46.0 43.6 41.3 38.2  MCV 97.9 96.7 93.4 94.1  PLT 186 197 183 0000000   Basic Metabolic Panel: Recent Labs  Lab 01/27/19 1155 01/28/19 0441 01/29/19 0326  NA 139 145 142  K 3.9 3.8 3.5  CL 104 104 103  CO2 19* 24 25  GLUCOSE 323* 133* 141*  BUN 21 20 23   CREATININE 1.19* 1.09* 1.10*  CALCIUM 9.2 9.3 8.7*  MG  --   --  1.9   Liver Function Tests: Recent Labs  Lab 01/28/19 0441  AST 29  ALT 19  ALKPHOS 89  BILITOT 1.3*  PROT 6.9  ALBUMIN 3.3*   No results for input(s): LIPASE, AMYLASE in the last 168 hours. No results for input(s): AMMONIA in the last 168 hours. Cardiac Enzymes: No results for input(s):  CKTOTAL, CKMB, CKMBINDEX, TROPONINI in the last 168 hours. BNP (last 3 results) Recent Labs    03/04/18 1609  BNP 37.6    ProBNP (last 3 results) No results for input(s): PROBNP in the last 8760 hours.  CBG: Recent Labs  Lab 01/29/19 2021 01/30/19 0041 01/30/19 0406 01/30/19 0731 01/30/19 1124  GLUCAP 142* 128* 112* 154* 114*   Recent Results (from the past 240 hour(s))  SARS CORONAVIRUS 2 (TAT 6-24 HRS) Nasopharyngeal Nasopharyngeal Swab     Status: None   Collection Time: 01/27/19  1:39 PM   Specimen: Nasopharyngeal Swab  Result Value Ref Range Status   SARS Coronavirus 2 NEGATIVE NEGATIVE Final    Comment: (NOTE) SARS-CoV-2 target nucleic acids are NOT DETECTED. The SARS-CoV-2 RNA is generally detectable in upper and lower respiratory specimens during the acute phase of infection. Negative results do not preclude SARS-CoV-2 infection, do not rule out co-infections with other pathogens, and should not be used as the sole basis for treatment or other patient management decisions. Negative results must be combined with clinical observations, patient history, and epidemiological information. The expected result is Negative. Fact  Sheet for Patients: SugarRoll.be Fact Sheet for Healthcare Providers: https://www.woods-mathews.com/ This test is not yet approved or cleared by the [place] FDA and  has been authorized for detection and/or diagnosis of SARS-CoV-2 by FDA under an Emergency Use Authorization (EUA). This EUA will remain  in effect (meaning this test can be used) for the duration of the COVID-19 declaration under Section 56 4(b)(1) of the Act, 21 U.S.C. section 360bbb-3(b)(1), unless the authorization is terminated or revoked sooner. Performed at [facility], [place] [address] [place], [place] [address]   MRSA PCR Screening     Status: None   Collection Time: 01/28/19  7:19 PM   Specimen: Nasal Mucosa;  Nasopharyngeal  Result Value Ref Range Status   MRSA by PCR NEGATIVE NEGATIVE Final    Comment:        The GeneXpert MRSA Assay (FDA approved for NASAL specimens only), is one component of a comprehensive MRSA colonization surveillance program. It is not intended to diagnose MRSA infection nor to guide or monitor treatment for MRSA infections. Performed at [facility], [place] [address] [place], [place] [address]      Studies: No results found.  Scheduled Meds: . apixaban  10 mg Oral BID   Followed by  . [START ON 02/06/2019] apixaban  5 mg Oral BID  . Chlorhexidine Gluconate Cloth  6 each Topical Q0600  . insulin aspart  0-9 Units Subcutaneous Q4H  . pantoprazole (PROTONIX) IV  40 mg Intravenous Q24H  . rosuvastatin  10 mg Oral Daily  . sodium chloride flush  3 mL Intravenous Once    Continuous Infusions:   [name], MD  [facility] 01/30/2019

## 2019-01-30 NOTE — Discharge Instructions (Signed)
Information on my medicine - ELIQUIS (apixaban)  This medication education was reviewed with me or my healthcare representative as part of my discharge preparation.    Why was Eliquis prescribed for you? Eliquis was prescribed to treat blood clots that may have been found in the veins of your legs (deep vein thrombosis) or in your lungs (pulmonary embolism) and to reduce the risk of them occurring again.  What do You need to know about Eliquis ? The starting dose is 10 mg (two 5 mg tablets) taken TWICE daily for the FIRST SEVEN (7) DAYS, then on (enter date) 02/06/19  the dose is reduced to ONE 5 mg tablet taken TWICE daily.  Eliquis may be taken with or without food.   Try to take the dose about the same time in the morning and in the evening. If you have difficulty swallowing the tablet whole please discuss with your pharmacist how to take the medication safely.  Take Eliquis exactly as prescribed and DO NOT stop taking Eliquis without talking to the doctor who prescribed the medication.  Stopping may increase your risk of developing a new blood clot.  Refill your prescription before you run out.  After discharge, you should have regular check-up appointments with your healthcare provider that is prescribing your Eliquis.    What do you do if you miss a dose? If a dose of ELIQUIS is not taken at the scheduled time, take it as soon as possible on the same day and twice-daily administration should be resumed. The dose should not be doubled to make up for a missed dose.  Important Safety Information A possible side effect of Eliquis is bleeding. You should call your healthcare provider right away if you experience any of the following: ? Bleeding from an injury or your nose that does not stop. ? Unusual colored urine (red or dark brown) or unusual colored stools (red or black). ? Unusual bruising for unknown reasons. ? A serious fall or if you hit your head (even if there is no  bleeding).  Some medicines may interact with Eliquis and might increase your risk of bleeding or clotting while on Eliquis. To help avoid this, consult your healthcare provider or pharmacist prior to using any new prescription or non-prescription medications, including herbals, vitamins, non-steroidal anti-inflammatory drugs (NSAIDs) and supplements.  This website has more information on Eliquis (apixaban): http://www.eliquis.com/eliquis/home

## 2019-01-31 LAB — BASIC METABOLIC PANEL
Anion gap: 11 (ref 5–15)
BUN: 19 mg/dL (ref 8–23)
CO2: 24 mmol/L (ref 22–32)
Calcium: 8.6 mg/dL — ABNORMAL LOW (ref 8.9–10.3)
Chloride: 105 mmol/L (ref 98–111)
Creatinine, Ser: 0.89 mg/dL (ref 0.44–1.00)
GFR calc Af Amer: >60 mL/min (ref >60)
GFR calc non Af Amer: >60 mL/min (ref >60)
Glucose, Bld: 123 mg/dL — ABNORMAL HIGH (ref 70–99)
Potassium: 3.5 mmol/L (ref 3.5–5.1)
Sodium: 140 mmol/L (ref 135–145)

## 2019-01-31 LAB — GLUCOSE, CAPILLARY
Glucose-Capillary: 107 mg/dL — ABNORMAL HIGH (ref 70–99)
Glucose-Capillary: 128 mg/dL — ABNORMAL HIGH (ref 70–99)
Glucose-Capillary: 138 mg/dL — ABNORMAL HIGH (ref 70–99)
Glucose-Capillary: 164 mg/dL — ABNORMAL HIGH (ref 70–99)

## 2019-01-31 LAB — CBC
HCT: 36.2 % (ref 36.0–46.0)
Hemoglobin: 12 g/dL (ref 12.0–15.0)
MCH: 31.1 pg (ref 26.0–34.0)
MCHC: 33.1 g/dL (ref 30.0–36.0)
MCV: 93.8 fL (ref 80.0–100.0)
Platelets: 202 10*3/uL (ref 150–400)
RBC: 3.86 MIL/uL — ABNORMAL LOW (ref 3.87–5.11)
RDW: 13.9 % (ref 11.5–15.5)
WBC: 5.3 10*3/uL (ref 4.0–10.5)
nRBC: 0 % (ref 0.0–0.2)

## 2019-01-31 LAB — MAGNESIUM: Magnesium: 2.2 mg/dL (ref 1.7–2.4)

## 2019-01-31 MED ORDER — APIXABAN 5 MG PO TABS: 5.0000 mg | ORAL_TABLET | Freq: Two times a day (BID) | ORAL | 0 refills | Status: DC

## 2019-01-31 MED ORDER — APIXABAN 5 MG PO TABS: 10.0000 mg | ORAL_TABLET | Freq: Two times a day (BID) | ORAL | 0 refills | Status: DC

## 2019-01-31 NOTE — Progress Notes (Signed)
01/31/2019 2:08 PM Discharge AVS meds taken today and those due this evening reviewed.  Follow-up appointments and when to call md reviewed.  D/C IV and TELE.  Questions and concerns addressed.   D/C home per orders. [place]

## 2019-01-31 NOTE — Care Management (Signed)
Patient given 30 day free Eliquis card.  Patient aware of copay of $47.

## 2019-02-03 NOTE — Progress Notes (Deleted)
Cardiology Office Note   Date:  02/03/2019   ID:  Sierra Ford, DOB 1940-10-05, MRN [id number]  PCP:  Sierra Ford, Sierra Apa, DO  Cardiologist:   Minus Breeding, MD Referring:  ***  No chief complaint on file.     History of Present Illness: Sierra Ford is a 78 y.o. female who presents for follow up of diastolic dysfunction and abnormal echo. Echo 04/16/17  demonstrated an EF of 65%I saw her last year and she returns for follow up. She called recently and despite losing 7 lbs she has had increased SOB.  ***    Since I last saw her she has had no new complaints.  She has gained about 15 lbs from eating too much.  She reports that she has chronic dyspnea with exertion. However, this is not really different than previous.  The patient denies any new symptoms such as chest discomfort, neck or arm discomfort. There has been no new PND or orthopnea. There have been no reported palpitations, presyncope or syncope.   She is not having cough fevers or chills.  She drinks a great deal of water.      Past Medical History:  Diagnosis Date  . Anxiety   . Back pain   . Chest pain    CLite with apical ischemia in 2006 - normal coronary arteries by [facility] in 12/2004;  Myoview 11/12:  Low risk stress nuclear study with a small, partially reversible apical defect most likely related to apical thinning; cannot R/O very mild apical ischemia.  EF: 75%   . Decreased hearing   . Depression   . Diabetes ([place])   . DVT (deep venous thrombosis) ([place]) 2006   hx of  . Ectopic pregnancy with intrauterine pregnancy   . Hiatal hernia   . Hyperlipidemia   . PE (pulmonary embolism) 2006   hx of  . Sinus congestion   . Sleep apnea    CPAP     Past Surgical History:  Procedure Laterality Date  . ABDOMINAL EXPLORATION SURGERY    . BUNIONECTOMY     right  . COLONOSCOPY  2009  . ECTOPIC PREGNANCY SURGERY    . FOOT SURGERY    . TUBAL LIGATION       Current Outpatient Medications    Medication Sig Dispense Refill  . Alirocumab (PRALUENT) 75 MG/ML SOAJ Inject 1 pen into the skin every 14 (fourteen) days. 2 pen 11  . apixaban (ELIQUIS) 5 MG TABS tablet Take 2 tablets (10 mg total) by mouth 2 (two) times daily. 24 tablet 0  . [START ON 02/06/2019] apixaban (ELIQUIS) 5 MG TABS tablet Take 1 tablet (5 mg total) by mouth 2 (two) times daily. 48 tablet 0  . blood glucose meter kit and supplies KIT Dispense based on patient and insurance preference. Use up to four times daily as directed. (FOR ICD-9 250.00, 250.01). 1 each 0  . CALCIUM-VITAMIN D PO Take 600 Units by mouth 2 (two) times daily.      . cholecalciferol (VITAMIN D-400) 400 UNITS TABS Take 400 Units by mouth 2 (two) times daily.     . Coenzyme Q10 100 MG TABS Take 100 mg by mouth daily.    . Flaxseed, Linseed, (FLAXSEED OIL PO) Take 1,000 mg by mouth daily.     . furosemide (LASIX) 40 MG tablet Take 1 tablet (40 mg total) by mouth daily. 90 tablet 2  . metFORMIN (GLUCOPHAGE) 500 MG tablet Take 1 tablet (500  mg total) by mouth 2 (two) times daily with a meal. 180 tablet 2  . Omega-3 Fatty Acids (FISH OIL PO) Take 1,200 mg by mouth daily.     Marland Kitchen POTASSIUM CHLORIDE PO Take 1 tablet by mouth as needed (muscle cramps).     . rosuvastatin (CRESTOR) 10 MG tablet Take 1 tablet (10 mg total) by mouth daily. 90 tablet 2  . TRUE METRIX BLOOD GLUCOSE TEST test strip TEST  UP  TO FOUR TIMES DAILY AS DIRECTED 200 each 12  . TRUEPLUS LANCETS 33G MISC TEST  UP TO FOUR TIMES DAILY AS DIRECTED 200 each 12  . Wheat Dextrin (BENEFIBER PO) Take 30 mLs by mouth 2 (two) times daily.    Marland Kitchen Zoster Vaccine Adjuvanted [facility]) injection 0.36m IM x1  Repeat in 2-6 months 0.5 mL 1   Current Facility-Administered Medications  Medication Dose Route Frequency Provider Last Rate Last Admin  . 0.9 %  sodium chloride infusion  500 mL Intravenous Once Nandigam, KVenia Minks MD        Allergies:   Patient has no known allergies.     ROS:  Please see  the history of present illness.   Otherwise, review of systems are positive for {NONE DEFAULTED:18576::"none"}.   All other systems are reviewed and negative.    PHYSICAL EXAM: VS:  There were no vitals taken for this visit. , BMI There is no height or weight on file to calculate BMI. GENERAL:  Well appearing NECK:  No jugular venous distention, waveform within normal limits, carotid upstroke brisk and symmetric, no bruits, no thyromegaly LUNGS:  Clear to auscultation bilaterally CHEST:  Unremarkable HEART:  PMI not displaced or sustained,S1 and S2 within normal limits, no S3, no S4, no clicks, no rubs, *** murmurs ABD:  Flat, positive bowel sounds normal in frequency in pitch, no bruits, no rebound, no guarding, no midline pulsatile mass, no hepatomegaly, no splenomegaly EXT:  2 plus pulses throughout, no edema, no cyanosis no clubbing   EKG:  EKG {ACTION; IS/IS NXAJ:[id number]}ordered today. The ekg ordered today demonstrates ***   Recent Labs: 03/04/2018: BNP 37.6 01/28/2019: ALT 19 01/31/2019: BUN 19; Creatinine, Ser 0.89; Hemoglobin 12.0; Magnesium 2.2; Platelets 202; Potassium 3.5; Sodium 140    Lipid Panel    Component Value Date/Time   CHOL 189 11/06/2018 0936   CHOL 209 (H) 08/26/2018 1018   TRIG 114.0 11/06/2018 0936   HDL 105.60 11/06/2018 0936   HDL 101 08/26/2018 1018   CHOLHDL 2 11/06/2018 0936   VLDL 22.8 11/06/2018 0936   LDLCALC 61 11/06/2018 0936   LDLCALC 88 08/26/2018 1018   LDLDIRECT 95.9 10/06/2012 1248      Wt Readings from Last 3 Encounters:  01/29/19 198 lb 6.6 oz (90 kg)  01/02/19 204 lb 9.6 oz (92.8 kg)  11/27/18 202 lb 9.6 oz (91.9 kg)      Other studies Reviewed: Additional studies/ records that were reviewed today include: ***. Review of the above records demonstrates:  Please see elsewhere in the note.  ***   ASSESSMENT AND PLAN:  DIASTOLIC HF:  ***  I had a long discussion with her about this.  She was very confused about the  diagnosis of HF.  I explained this and that we treat this by reducing her fluid intake and managing salt.  I did see her labs and her creat was 0.89.      DM:A1c was ***  6.7.  Continue current therapy.   SLEEP APNEA: ***  She wears her CPAP.    DYSLIPIDEMIA:   ***   She is being followed in the [facility].   LDL was 90.  Continue current therapy.  COVID EDUCATION:  ***    Current medicines are reviewed at length with the patient today.  The patient {ACTIONS; HAS/DOES NOT HAVE:19233} concerns regarding medicines.  The following changes have been made:  {PLAN; NO CHANGE:13088:s}  Labs/ tests ordered today include: *** No orders of the defined types were placed in this encounter.    Disposition:   FU with ***    Signed, Minus Breeding, MD  02/03/2019 5:39 PM    [facility]

## 2019-02-04 ENCOUNTER — Encounter: Payer: Self-pay | Admitting: Cardiology

## 2019-02-04 ENCOUNTER — Telehealth: Payer: Self-pay | Admitting: Cardiology

## 2019-02-04 ENCOUNTER — Other Ambulatory Visit: Payer: Self-pay

## 2019-02-04 ENCOUNTER — Ambulatory Visit (INDEPENDENT_AMBULATORY_CARE_PROVIDER_SITE_OTHER): Payer: Medicare Other | Admitting: Cardiology

## 2019-02-04 VITALS — BP 129/73 | HR 75 | Temp 95.9°F | Ht 62.0 in | Wt 200.8 lb

## 2019-02-04 DIAGNOSIS — R0609 Other forms of dyspnea: Secondary | ICD-10-CM

## 2019-02-04 DIAGNOSIS — E785 Hyperlipidemia, unspecified: Secondary | ICD-10-CM

## 2019-02-04 DIAGNOSIS — Z7189 Other specified counseling: Secondary | ICD-10-CM | POA: Diagnosis not present

## 2019-02-04 DIAGNOSIS — G4730 Sleep apnea, unspecified: Secondary | ICD-10-CM | POA: Diagnosis not present

## 2019-02-04 DIAGNOSIS — I5032 Chronic diastolic (congestive) heart failure: Secondary | ICD-10-CM

## 2019-02-04 DIAGNOSIS — E118 Type 2 diabetes mellitus with unspecified complications: Secondary | ICD-10-CM

## 2019-02-04 DIAGNOSIS — I2699 Other pulmonary embolism without acute cor pulmonale: Secondary | ICD-10-CM

## 2019-02-04 DIAGNOSIS — R0600 Dyspnea, unspecified: Secondary | ICD-10-CM

## 2019-02-04 MED ORDER — ROSUVASTATIN CALCIUM 10 MG PO TABS: 10.0000 mg | ORAL_TABLET | Freq: Every day | ORAL | 3 refills | Status: DC

## 2019-02-04 NOTE — Patient Instructions (Addendum)
Medication Instructions:  Your physician recommends that you continue on your current medications as directed. Please refer to the Current Medication list given to you today.  *If you need a refill on your cardiac medications before your next appointment, please call your pharmacy*  Lab Work: none If you have labs (blood work) drawn today and your tests are completely normal, you will receive your results only by: Marland Kitchen MyChart Message (if you have MyChart) OR . A paper copy in the mail If you have any lab test that is abnormal or we need to change your treatment, we will call you to review the results.  Testing/Procedures: Your physician has requested that you have an echocardiogram. Echocardiography is a painless test that uses sound waves to create images of your heart. It provides your doctor with information about the size and shape of your heart and how well your heart's chambers and valves are working. This procedure takes approximately one hour. There are no restrictions for this procedure. LOCATION: [facility]: [place], [place],  [address]  DUE January 2021  Follow-Up: At [facility], you and your health needs are our priority.  As part of our continuing mission to provide you with exceptional heart care, we have created designated Provider Care Teams.  These Care Teams include your primary Cardiologist (physician) and Advanced Practice Providers (APPs -  Physician Assistants and Nurse Practitioners) who all work together to provide you with the care you need, when you need it.  Your next appointment:   AFTER YOUR ECHOCARDIOGRAM  The format for your next appointment:   In Person  Provider:    You may see Minus Breeding, MD     Other Instructions PLEASE PURCHASE A PULSE OXIMETER  PLEASE COMPLETE YOUR PATIENT ASSISTANCE FORM FOR ELIQUIS AND RETURN TO THE OFFICE  [facility] WILL BE IN CONTACT WITH YOU  REGARDING YOUR HOME OXYGEN   Oximetry Oximetry is a technology that measures the oxygen level in the blood. This measurement is taken with a device called an oximeter. This device may also measure the heart rate (pulse). The measurement helps to assess:  A person's oxygen level and breathing.  The need or effectiveness of oxygen therapy or other treatments for lung disease.  A person's ability to tolerate increased activity. If a more accurate measurement is required, a blood sample will be taken. How is an oximetry reading obtained?   A tape sensor or clip with a light source and a light detector will be placed on an area of the body: ? For children and adults, the sensor is usually placed on areas such as a finger, earlobe, or toe. ? For infants, a tape sensor is usually placed around areas such as the sole of a foot or the palm of a hand.  The device will beam light through the skin and blood. This cannot be felt.  The levels of light received by the detector will be measured and the percentage of blood cells carrying oxygen will be calculated. Oximetry may be used continuously or may be used at specified intervals. Are there any risks associated with oximetry? The risks associated with oximetry are rare. However, there is a risk of skin breakdown if a sensor is left in the same location for long periods of time. What can affect the accuracy of the oximetry reading? Certain factors or conditions can affect the accuracy of the measurements. They include:  Extreme warmth  or coolness of the area where the sensor is located.  Excessive sweating of the area where the sensor is located.  Shivering or too much movement.  Poor blood flow to the area where the sensor is located.  Low hemoglobin or red blood cell levels (anemia).  A bone marrow disease that causes high levels of red blood cells, white blood cells, and platelets (polycythemia vera).  Chronic smoking and recent  inhalation of smoke or carbon monoxide.  Bright, artificial lighting.  Nail polish or artificial nails.  Very dark skin. What is the meaning of the oximetry reading? The normal value depends upon your medical history and your elevation above sea level.  Normal oxygen saturation levels are 90% or higher. Most healthy people have oxygen saturation levels between 95% and 100%.  Low oxygen saturation levels are below 90%. This may happen in people with lung conditions, such as chronic obstructive pulmonary disease (COPD). In this case, supplemental oxygen therapy may be needed. Summary  Oximetry uses a small device to measure the oxygen level in the blood.  The light in the sensor cannot be felt. The risks associated with oximetry are rare.  Normal oxygen levels are 90% or higher. Most healthy people have oxygen levels between 95% and 100%.  People with low oxygen levels may need supplemental oxygen. This information is not intended to replace advice given to you by your health care provider. Make sure you discuss any questions you have with your health care provider. Document Released: 04/26/2004 Document Revised: 05/29/2018 Document Reviewed: 02/09/2016 Elsevier Patient Education  2020 [employer] Heart-healthy meal planning includes:  Eating less unhealthy fats.  Eating more healthy fats.  Making other changes in your diet. Talk with your doctor or a diet specialist (dietitian) to create an eating plan that is right for you. What is my plan? Your doctor may recommend an eating plan that includes:  Total fat: ______% or less of total calories a day.  Saturated fat: ______% or less of total calories a day.  Cholesterol: less than _________mg a day. What are tips for following this plan? Cooking Avoid frying your food. Try to bake, boil, grill, or broil it instead. You can also reduce fat by:  Removing the skin from  poultry.  Removing all visible fats from meats.  Steaming vegetables in water or broth. Meal planning   At meals, divide your plate into four equal parts: ? Fill one-half of your plate with vegetables and green salads. ? Fill one-fourth of your plate with whole grains. ? Fill one-fourth of your plate with lean protein foods.  Eat 4-5 servings of vegetables per day. A serving of vegetables is: ? 1 cup of raw or cooked vegetables. ? 2 cups of raw leafy greens.  Eat 4-5 servings of fruit per day. A serving of fruit is: ? 1 medium whole fruit. ?  cup of dried fruit. ?  cup of fresh, frozen, or canned fruit. ?  cup of 100% fruit juice.  Eat more foods that have soluble fiber. These are apples, broccoli, carrots, beans, peas, and barley. Try to get 20-30 g of fiber per day.  Eat 4-5 servings of nuts, legumes, and seeds per week: ? 1 serving of dried beans or legumes equals  cup after being cooked. ? 1 serving of nuts is  cup. ? 1 serving of seeds equals 1 tablespoon. General information  Eat more home-cooked food. Eat  less restaurant, buffet, and fast food.  Limit or avoid alcohol.  Limit foods that are high in starch and sugar.  Avoid fried foods.  Lose weight if you are overweight.  Keep track of how much salt (sodium) you eat. This is important if you have high blood pressure. Ask your doctor to tell you more about this.  Try to add vegetarian meals each week. Fats  Choose healthy fats. These include olive oil and canola oil, flaxseeds, walnuts, almonds, and seeds.  Eat more omega-3 fats. These include salmon, mackerel, sardines, tuna, flaxseed oil, and ground flaxseeds. Try to eat fish at least 2 times each week.  Check food labels. Avoid foods with trans fats or high amounts of saturated fat.  Limit saturated fats. ? These are often found in animal products, such as meats, butter, and cream. ? These are also found in plant foods, such as palm oil, palm kernel  oil, and coconut oil.  Avoid foods with partially hydrogenated oils in them. These have trans fats. Examples are stick margarine, some tub margarines, cookies, crackers, and other baked goods. What foods can I eat? Fruits All fresh, canned (in natural juice), or frozen fruits. Vegetables Fresh or frozen vegetables (raw, steamed, roasted, or grilled). Green salads. Grains Most grains. Choose whole wheat and whole grains most of the time. Rice and pasta, including brown rice and pastas made with whole wheat. Meats and other proteins Lean, well-trimmed beef, veal, pork, and lamb. Chicken and [place] without skin. All fish and shellfish. Wild duck, rabbit, pheasant, and venison. Egg whites or low-cholesterol egg substitutes. Dried beans, peas, lentils, and tofu. Seeds and most nuts. Dairy Low-fat or nonfat cheeses, including ricotta and mozzarella. Skim or 1% milk that is liquid, powdered, or evaporated. Buttermilk that is made with low-fat milk. Nonfat or low-fat yogurt. Fats and oils Non-hydrogenated (trans-free) margarines. Vegetable oils, including soybean, sesame, sunflower, olive, peanut, safflower, corn, canola, and cottonseed. Salad dressings or mayonnaise made with a vegetable oil. Beverages Mineral water. Coffee and tea. Diet carbonated beverages. Sweets and desserts Sherbet, gelatin, and fruit ice. Small amounts of dark chocolate. Limit all sweets and desserts. Seasonings and condiments All seasonings and condiments. The items listed above may not be a complete list of foods and drinks you can eat. Contact a dietitian for more options. What foods should I avoid? Fruits Canned fruit in heavy syrup. Fruit in cream or butter sauce. Fried fruit. Limit coconut. Vegetables Vegetables cooked in cheese, cream, or butter sauce. Fried vegetables. Grains Breads that are made with saturated or trans fats, oils, or whole milk. Croissants. Sweet rolls. Donuts. High-fat crackers, such as cheese  crackers. Meats and other proteins Fatty meats, such as hot dogs, ribs, sausage, bacon, rib-eye roast or steak. High-fat deli meats, such as salami and bologna. Caviar. Domestic duck and goose. Organ meats, such as liver. Dairy Cream, sour cream, cream cheese, and creamed cottage cheese. Whole-milk cheeses. Whole or 2% milk that is liquid, evaporated, or condensed. Whole buttermilk. Cream sauce or high-fat cheese sauce. Yogurt that is made from whole milk. Fats and oils Meat fat, or shortening. Cocoa butter, hydrogenated oils, palm oil, coconut oil, palm kernel oil. Solid fats and shortenings, including bacon fat, salt pork, lard, and butter. Nondairy cream substitutes. Salad dressings with cheese or sour cream. Beverages Regular sodas and juice drinks with added sugar. Sweets and desserts Frosting. Pudding. Cookies. Cakes. Pies. Milk chocolate or white chocolate. Buttered syrups. Full-fat ice cream or ice cream drinks. The items  listed above may not be a complete list of foods and drinks to avoid. Contact a dietitian for more information. Summary  Heart-healthy meal planning includes eating less unhealthy fats, eating more healthy fats, and making other changes in your diet.  Eat a balanced diet. This includes fruits and vegetables, low-fat or nonfat dairy, lean protein, nuts and legumes, whole grains, and heart-healthy oils and fats. This information is not intended to replace advice given to you by your health care provider. Make sure you discuss any questions you have with your health care provider. Document Released: 08/07/2011 Document Revised: 04/11/2017 Document Reviewed: 03/15/2017 Elsevier Patient Education  2020 [employer].

## 2019-02-04 NOTE — Progress Notes (Signed)
Cardiology Office Note   Date:  02/04/2019   ID:  Sierra Ford, DOB Jul 10, 1940, MRN [id number]  PCP:  Carollee Herter, Alferd Apa, DO  Cardiologist:   Minus Breeding, MD   Chief Complaint  Patient presents with  . Shortness of Breath      History of Present Illness: Sierra Ford is a 78 y.o. female who presents for follow up of diastolic dysfunction and abnormal echo. Echo 04/16/17  demonstrated an EF of 65%I saw her last year and she returns for follow up.   She was just discharged from the hospital couple of days ago.  She went in with increasing shortness of breath .  I have reviewed these records for this visit.  She was found to have acute PE with right heart strain.   She was treated with thrombolysis.  He was managed with anticoagulation started on Eliquis.  She was noted to have a right lower extremity DVT.  Since going home she has had continued shortness of breath.  He is not had any new chest pressure, neck or arm discomfort.  She has no weight gain or edema.  New lower extremity swelling and she is in office for diuretic.   Since I last saw her she has had no new complaints.  She has gained about 15 lbs from eating too much.  She reports that she has chronic dyspnea with exertion. However, this is not really different than previous.  The patient denies any new symptoms such as chest discomfort, neck or arm discomfort. There has been no new PND or orthopnea. There have been no reported palpitations, presyncope or syncope.   She is not having cough fevers or chills.  She drinks a great deal of water.      Past Medical History:  Diagnosis Date  . Anxiety   . Back pain   . Chest pain    CLite with apical ischemia in 2006 - normal coronary arteries by [facility] in 12/2004;  Myoview 11/12:  Low risk stress nuclear study with a small, partially reversible apical defect most likely related to apical thinning; cannot R/O very mild apical ischemia.  EF: 75%   . Decreased hearing     . Depression   . Diabetes ([place])   . DVT (deep venous thrombosis) ([place]) 2006   hx of  . Ectopic pregnancy with intrauterine pregnancy   . Hiatal hernia   . Hyperlipidemia   . PE (pulmonary embolism) 2006   hx of  . Sleep apnea    CPAP     Past Surgical History:  Procedure Laterality Date  . ABDOMINAL EXPLORATION SURGERY    . BUNIONECTOMY     right  . COLONOSCOPY  2009  . ECTOPIC PREGNANCY SURGERY    . FOOT SURGERY    . TUBAL LIGATION       Current Outpatient Medications  Medication Sig Dispense Refill  . Alirocumab (PRALUENT) 75 MG/ML SOAJ Inject 1 pen into the skin every 14 (fourteen) days. 2 pen 11  . apixaban (ELIQUIS) 5 MG TABS tablet Take 2 tablets (10 mg total) by mouth 2 (two) times daily. 24 tablet 0  . [START ON 02/06/2019] apixaban (ELIQUIS) 5 MG TABS tablet Take 1 tablet (5 mg total) by mouth 2 (two) times daily. 48 tablet 0  . blood glucose meter kit and supplies KIT Dispense based on patient and insurance preference. Use up to four times daily as directed. (FOR ICD-9 250.00, 250.01). 1 each 0  .  CALCIUM-VITAMIN D PO Take 600 Units by mouth 2 (two) times daily.      . cholecalciferol (VITAMIN D-400) 400 UNITS TABS Take 400 Units by mouth 2 (two) times daily.     . Coenzyme Q10 100 MG TABS Take 100 mg by mouth daily.    . ergocalciferol (VITAMIN D2) 1.25 MG (50000 UT) capsule Vitamin D2 1,250 mcg (50,000 unit) capsule    . Flaxseed, Linseed, (FLAXSEED OIL PO) Take 1,000 mg by mouth daily.     . furosemide (LASIX) 40 MG tablet Take 1 tablet (40 mg total) by mouth daily. 90 tablet 2  . metFORMIN (GLUCOPHAGE) 500 MG tablet Take 1 tablet (500 mg total) by mouth 2 (two) times daily with a meal. 180 tablet 2  . Omega-3 Fatty Acids (FISH OIL PO) Take 1,200 mg by mouth daily.     Marland Kitchen POTASSIUM CHLORIDE PO Take 1 tablet by mouth as needed (muscle cramps).     . rosuvastatin (CRESTOR) 10 MG tablet Take 1 tablet (10 mg total) by mouth daily. 90 tablet 3  . TRUE METRIX BLOOD  GLUCOSE TEST test strip TEST  UP  TO FOUR TIMES DAILY AS DIRECTED 200 each 12  . TRUEPLUS LANCETS 33G MISC TEST  UP TO FOUR TIMES DAILY AS DIRECTED 200 each 12  . Wheat Dextrin (BENEFIBER PO) Take 30 mLs by mouth 2 (two) times daily.    Marland Kitchen Zoster Vaccine Adjuvanted [facility]) injection 0.29m IM x1  Repeat in 2-6 months 0.5 mL 1   Current Facility-Administered Medications  Medication Dose Route Frequency Provider Last Rate Last Admin  . 0.9 %  sodium chloride infusion  500 mL Intravenous Once Nandigam, KVenia Minks MD        Allergies:   Patient has no known allergies.     ROS:  Please see the history of present illness.   Otherwise, review of systems are positive for none.   All other systems are reviewed and negative.    PHYSICAL EXAM: VS:  BP 129/73   Pulse 75   Temp (!) 95.9 F (35.5 C)   Ht _0  (1.575 m)   Wt 200 lb 12.8 oz (91.1 kg)   SpO2 94%   BMI 36.73 kg/m  , BMI Body mass index is 36.73 kg/m. GENERAL:  Well appearing NECK:  No jugular venous distention, waveform within normal limits, carotid upstroke brisk and symmetric, no bruits, no thyromegaly LUNGS:  Clear to auscultation bilaterally CHEST:  Unremarkable HEART:  PMI not displaced or sustained,S1 and S2 within normal limits, no S3, no S4, no clicks, no rubs, no murmurs ABD:  Flat, positive bowel sounds normal in frequency in pitch, no bruits, no rebound, no guarding, no midline pulsatile mass, no hepatomegaly, no splenomegaly EXT:  2 plus pulses throughout, mild right greater than left edema, no cyanosis no clubbing   EKG:  EKG is not ordered today. The  SATURATION QUALIFICATIONS: (This note is used to comply with regulatory documentation for home oxygen)  Patient Saturations on Room Air at Rest = 95%  Patient Saturations on Room Air while Ambulating = 74%  Patient Saturations on 2 Liters of oxygen while Ambulating = 91% Patient Saturations on 3 Liters of oxygen while Ambulating = 96%  Please briefly  explain why patient needs home oxygen: Recent Labs: 03/04/2018: BNP 37.6 01/28/2019: ALT 19 01/31/2019: BUN 19; Creatinine, Ser 0.89; Hemoglobin 12.0; Magnesium 2.2; Platelets 202; Potassium 3.5; Sodium 140    Lipid Panel    Component Value  Date/Time   CHOL 189 11/06/2018 0936   CHOL 209 (H) 08/26/2018 1018   TRIG 114.0 11/06/2018 0936   HDL 105.60 11/06/2018 0936   HDL 101 08/26/2018 1018   CHOLHDL 2 11/06/2018 0936   VLDL 22.8 11/06/2018 0936   LDLCALC 61 11/06/2018 0936   LDLCALC 88 08/26/2018 1018   LDLDIRECT 95.9 10/06/2012 1248     Lab Results  Component Value Date   HGBA1C 6.8 (H) 11/06/2018     Wt Readings from Last 3 Encounters:  02/04/19 200 lb 12.8 oz (91.1 kg)  01/29/19 198 lb 6.6 oz (90 kg)  01/02/19 204 lb 9.6 oz (92.8 kg)      Other studies Reviewed: Additional studies/ records that were reviewed today include: Extensive review of hospital records.   (Greater than 60 minutes reviewing all data with greater than 50% face to face with the patient).. Review of the above records demonstrates:  Please see elsewhere in the note.     ASSESSMENT AND PLAN:  ACUTE PULMONARY EMBOLISM: This was reviewed extensively.  She had RV strain.  I did review the images personally.  She will be on chronic anticoagulation indefinitely.  This seems to have been unprovoked.  As above she has decreased saturations significantly with ambulation.  We are arranging home O2.  I will repeat an echocardiogram in 1 month to look again at the RV strain.  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  We are holding off on anticoagulation right now.  She is instructed to take this as needed weight gain.  DM:A1c was 6.8 most recently.  No change in therapy.  SLEEP APNEA:  She wears CPAP.  No change in therapy.  DYSLIPIDEMIA:   She is on Crestor.  No change in therapy.  COVID EDUCATION: We talked about the vaccine.   Current medicines are reviewed at length with the patient today.   The patient does not have concerns regarding medicines.  The following changes have been made:  no change  Labs/ tests ordered today include:   Orders Placed This Encounter  Procedures  . For home use only DME oxygen  . ECHOCARDIOGRAM COMPLETE     Disposition:   FU with me after the next echo.      Signed, Minus Breeding, MD  02/04/2019 4:59 PM    [facility]

## 2019-02-04 NOTE — Telephone Encounter (Signed)
Patient had visit today with Dr. Percival Spanish, she is saying that Dr. Percival Spanish was going to order O2 for her for [facility] to be delivered today. She saying she hasn't heard anything yet from them. She would like to know what is going on.

## 2019-02-04 NOTE — Telephone Encounter (Signed)
New Message  Pt c/o medication issue:  1. Name of Medication: Alirocumab ([place]line) 75 MG/ML SOAJ  2. How are you currently taking this medication (dosage and times per day)? Inject 1 pen into the skin every 14 (fourteen) days.  3. Are you having a reaction (difficulty breathing--STAT)? No  4. What is your medication issue? Patient said that the pharmacy is trying to charge her over $100 for the medication and this is the same thing that happened last time and needs Jinny Blossom Supple to call over and straighten this situation out again. Please give patient a call and assist.

## 2019-02-04 NOTE — Telephone Encounter (Signed)
Spoke to [facility] who said rx was transferred to [employer] [employer]. Wasn't billed to [facility]. Called [facility], they do have rx and it has since been billed to secondary [employer]) and is now $0. Patient appreciative of the help

## 2019-02-04 NOTE — Telephone Encounter (Signed)
Called the pt back and they stated that [facility] had already called them back and that she was [place] have it handled w/her

## 2019-02-05 DIAGNOSIS — I2609 Other pulmonary embolism with acute cor pulmonale: Secondary | ICD-10-CM | POA: Diagnosis not present

## 2019-02-05 DIAGNOSIS — I5032 Chronic diastolic (congestive) heart failure: Secondary | ICD-10-CM | POA: Diagnosis not present

## 2019-02-05 DIAGNOSIS — R0600 Dyspnea, unspecified: Secondary | ICD-10-CM | POA: Diagnosis not present

## 2019-02-05 DIAGNOSIS — I2699 Other pulmonary embolism without acute cor pulmonale: Secondary | ICD-10-CM | POA: Diagnosis not present

## 2019-02-05 DIAGNOSIS — G4733 Obstructive sleep apnea (adult) (pediatric): Secondary | ICD-10-CM | POA: Diagnosis not present

## 2019-02-05 NOTE — Telephone Encounter (Signed)
Spoke with Sierra Ford and everything needed for home oxygen. Spoke with patient and she has oxygen now.   Patient having some swelling in her right ankle that she just noticed today. She did take Lasix which she only uses as needed for swelling. She will keep leg elevated. She has been taking her Eliquis as prescribed, confirmed no missed doses. Denies any pain. She will continue to monitor and call back if no improvement   Will forward to Dr Percival Spanish for review

## 2019-02-05 NOTE — Telephone Encounter (Signed)
Ankle was swollen yesterday a little.

## 2019-02-05 NOTE — Telephone Encounter (Signed)
Left message for Sierra Ford with [facility] call back

## 2019-02-06 ENCOUNTER — Other Ambulatory Visit: Payer: Self-pay | Admitting: *Deleted

## 2019-02-06 NOTE — Patient Outreach (Signed)
[facility]) Care Management  02/06/2019  Sierra Ford Jan 11, 1941 [id number]    EMMI-GENERAL DISCHARGE-RESOLVED RED ON EMMI ALERT Day # 4 Date: 02/05/2019 Red Alert Reason: Donney Rankins prescription/unable to fill today or tomorrow/question and concerns  Outreach #1 RN spoke with pt today concerning the above EMMI. Pt states she has filled all her prescriptions and taking according to how they have been prescribed. Pt inquires are directed to [place] related to her 02 to tie in with her CPAP. Pt states she was shown how to do this but can't remember all the steps. Pt has contact [employer] and spoke with representative twice today and currently awaiting a call back to troubleshoot this issues. Pt has the contact and encouraged to follow by the end of the day if no call back. Pt was able to use her 02 last night but did not use her CPAP. RN verified pt is not in distress or having any problems with her breathing at this time. RN will verify no other needs at this time.  PLAN: Based upon resolution to the above stated EMMIs with no other needs at this time will close this case.  Raina Mina, RN Care Management Coordinator [place] Office [phone]

## 2019-02-12 ENCOUNTER — Other Ambulatory Visit: Payer: Self-pay

## 2019-02-16 ENCOUNTER — Encounter: Payer: Self-pay | Admitting: Family Medicine

## 2019-02-16 ENCOUNTER — Ambulatory Visit (INDEPENDENT_AMBULATORY_CARE_PROVIDER_SITE_OTHER): Payer: Medicare Other | Admitting: Family Medicine

## 2019-02-16 ENCOUNTER — Other Ambulatory Visit: Payer: Self-pay

## 2019-02-16 VITALS — BP 132/82 | HR 75 | Temp 96.2°F | Resp 19 | Ht 62.0 in | Wt 191.0 lb

## 2019-02-16 DIAGNOSIS — E1169 Type 2 diabetes mellitus with other specified complication: Secondary | ICD-10-CM | POA: Diagnosis not present

## 2019-02-16 DIAGNOSIS — IMO0002 Reserved for concepts with insufficient information to code with codable children: Secondary | ICD-10-CM

## 2019-02-16 DIAGNOSIS — E1165 Type 2 diabetes mellitus with hyperglycemia: Secondary | ICD-10-CM

## 2019-02-16 DIAGNOSIS — I5032 Chronic diastolic (congestive) heart failure: Secondary | ICD-10-CM

## 2019-02-16 DIAGNOSIS — I2699 Other pulmonary embolism without acute cor pulmonale: Secondary | ICD-10-CM | POA: Diagnosis not present

## 2019-02-16 DIAGNOSIS — E785 Hyperlipidemia, unspecified: Secondary | ICD-10-CM

## 2019-02-16 DIAGNOSIS — I10 Essential (primary) hypertension: Secondary | ICD-10-CM

## 2019-02-16 DIAGNOSIS — E1151 Type 2 diabetes mellitus with diabetic peripheral angiopathy without gangrene: Secondary | ICD-10-CM

## 2019-02-16 DIAGNOSIS — G4733 Obstructive sleep apnea (adult) (pediatric): Secondary | ICD-10-CM

## 2019-02-16 LAB — COMPREHENSIVE METABOLIC PANEL
ALT: 17 U/L (ref 0–35)
AST: 18 U/L (ref 0–37)
Albumin: 4.6 g/dL (ref 3.5–5.2)
Alkaline Phosphatase: 76 U/L (ref 39–117)
BUN: 13 mg/dL (ref 6–23)
CO2: 31 mEq/L (ref 19–32)
Calcium: 10.4 mg/dL (ref 8.4–10.5)
Chloride: 102 mEq/L (ref 96–112)
Creatinine, Ser: 0.89 mg/dL (ref 0.40–1.20)
GFR: 74.34 mL/min (ref >60.00)
Glucose, Bld: 115 mg/dL — ABNORMAL HIGH (ref 70–99)
Potassium: 4.3 mEq/L (ref 3.5–5.1)
Sodium: 141 mEq/L (ref 135–145)
Total Bilirubin: 1.4 mg/dL — ABNORMAL HIGH (ref 0.2–1.2)
Total Protein: 7.2 g/dL (ref 6.0–8.3)

## 2019-02-16 LAB — LIPID PANEL
Cholesterol: 157 mg/dL (ref 0–200)
HDL: 84.6 mg/dL (ref >39.00)
LDL Cholesterol: 55 mg/dL (ref 0–99)
NonHDL: 72.3
Total CHOL/HDL Ratio: 2
Triglycerides: 86 mg/dL (ref 0.0–149.0)
VLDL: 17.2 mg/dL (ref 0.0–40.0)

## 2019-02-16 LAB — HEMOGLOBIN A1C: Hgb A1c MFr Bld: 6.7 % — ABNORMAL HIGH (ref 4.6–6.5)

## 2019-02-16 NOTE — Assessment & Plan Note (Signed)
Per cardiology 

## 2019-02-16 NOTE — Assessment & Plan Note (Signed)
Tolerating statin, encouraged heart healthy diet, avoid trans fats, minimize simple carbs and saturated fats. Increase exercise as tolerated 

## 2019-02-16 NOTE — Assessment & Plan Note (Signed)
On lifetime eliquis

## 2019-02-16 NOTE — Assessment & Plan Note (Signed)
Well controlled, no changes to meds. Encouraged heart healthy diet such as the DASH diet and exercise as tolerated.  °

## 2019-02-16 NOTE — Progress Notes (Signed)
Patient ID: Sierra Ford, female    DOB: 1940-07-29  Age: 78 y.o. MRN: [id number]    Subjective:  Subjective  HPI Sierra Ford presents for f/u hosp for pulm embolism with r heart strain.   Pt presented to er with sob and near sycope.  Review of Systems  Constitutional: Negative for appetite change, diaphoresis, fatigue and unexpected weight change.  Eyes: Negative for pain, redness and visual disturbance.  Respiratory: Positive for shortness of breath. Negative for cough, chest tightness and wheezing.   Cardiovascular: Negative for chest pain, palpitations and leg swelling.  Endocrine: Negative for cold intolerance, heat intolerance, polydipsia, polyphagia and polyuria.  Genitourinary: Negative for difficulty urinating, dysuria and frequency.  Neurological: Negative for dizziness, light-headedness, numbness and headaches.    History Past Medical History:  Diagnosis Date  . Anxiety   . Back pain   . Chest pain    CLite with apical ischemia in 2006 - normal coronary arteries by [facility] in 12/2004;  Myoview 11/12:  Low risk stress nuclear study with a small, partially reversible apical defect most likely related to apical thinning; cannot R/O very mild apical ischemia.  EF: 75%   . Decreased hearing   . Depression   . Diabetes ([place])   . DVT (deep venous thrombosis) ([place]) 2006   hx of  . Ectopic pregnancy with intrauterine pregnancy   . Hiatal hernia   . Hyperlipidemia   . PE (pulmonary embolism) 2006   hx of  . Sleep apnea    CPAP     She has a past surgical history that includes Foot surgery; Abdominal exploration surgery; Ectopic pregnancy surgery; Bunionectomy; Tubal ligation; and Colonoscopy (2009).   Her family history includes Alcoholism in her father; Colon polyps in her mother; Diabetes in her maternal aunt and maternal aunt; Heart failure in her mother; Hypertension in her mother; Obesity in her mother; Sudden death in her mother.She reports that she quit smoking  about 9 years ago. Her smoking use included cigarettes. She has a 50.00 pack-year smoking history. She has never used smokeless tobacco. She reports current alcohol use. She reports that she does not use drugs.  Current Outpatient Medications on File Prior to Visit  Medication Sig Dispense Refill  . Alirocumab (PRALUENT) 75 MG/ML SOAJ Inject 1 pen into the skin every 14 (fourteen) days. 2 pen 11  . apixaban (ELIQUIS) 5 MG TABS tablet Take 1 tablet (5 mg total) by mouth 2 (two) times daily. 48 tablet 0  . blood glucose meter kit and supplies KIT Dispense based on patient and insurance preference. Use up to four times daily as directed. (FOR ICD-9 250.00, 250.01). 1 each 0  . CALCIUM-VITAMIN D PO Take 600 Units by mouth 2 (two) times daily.      . cholecalciferol (VITAMIN D-400) 400 UNITS TABS Take 400 Units by mouth 2 (two) times daily.     . Coenzyme Q10 100 MG TABS Take 100 mg by mouth daily.    . ergocalciferol (VITAMIN D2) 1.25 MG (50000 UT) capsule Vitamin D2 1,250 mcg (50,000 unit) capsule    . Flaxseed, Linseed, (FLAXSEED OIL PO) Take 1,000 mg by mouth daily.     . furosemide (LASIX) 40 MG tablet Take 1 tablet (40 mg total) by mouth daily. 90 tablet 2  . metFORMIN (GLUCOPHAGE) 500 MG tablet Take 1 tablet (500 mg total) by mouth 2 (two) times daily with a meal. 180 tablet 2  . Omega-3 Fatty Acids (FISH OIL PO) Take  1,200 mg by mouth daily.     Marland Kitchen POTASSIUM CHLORIDE PO Take 1 tablet by mouth as needed (muscle cramps).     . rosuvastatin (CRESTOR) 10 MG tablet Take 1 tablet (10 mg total) by mouth daily. 90 tablet 3  . TRUE METRIX BLOOD GLUCOSE TEST test strip TEST  UP  TO FOUR TIMES DAILY AS DIRECTED 200 each 12  . TRUEPLUS LANCETS 33G MISC TEST  UP TO FOUR TIMES DAILY AS DIRECTED 200 each 12  . Wheat Dextrin (BENEFIBER PO) Take 30 mLs by mouth 2 (two) times daily.    Marland Kitchen Zoster Vaccine Adjuvanted [facility]) injection 0.79m IM x1  Repeat in 2-6 months 0.5 mL 1  . [DISCONTINUED] omega-3 acid  ethyl esters (LOVAZA) 1 G capsule Take 1 g by mouth 3 (three) times daily.      . [DISCONTINUED] pravastatin (PRAVACHOL) 20 MG tablet Take 20 mg by mouth daily.       Current Facility-Administered Medications on File Prior to Visit  Medication Dose Route Frequency Provider Last Rate Last Admin  . 0.9 %  sodium chloride infusion  500 mL Intravenous Once Nandigam, KVenia Minks MD         Objective:  Objective  Physical Exam Vitals and nursing note reviewed.  Constitutional:      Appearance: She is well-developed.  HENT:     Head: Normocephalic and atraumatic.  Eyes:     Conjunctiva/sclera: Conjunctivae normal.  Neck:     Thyroid: No thyromegaly.     Vascular: No carotid bruit or JVD.  Cardiovascular:     Rate and Rhythm: Normal rate and regular rhythm.     Heart sounds: Normal heart sounds. No murmur.  Pulmonary:     Effort: Pulmonary effort is normal. No respiratory distress.     Breath sounds: Normal breath sounds. No wheezing or rales.  Chest:     Chest wall: No tenderness.  Musculoskeletal:     Cervical back: Normal range of motion and neck supple.  Neurological:     Mental Status: She is alert and oriented to person, place, and time.    BP 132/82 (BP Location: Left Arm, Patient Position: Sitting, Cuff Size: Large)   Pulse 75   Temp (!) 96.2 F (35.7 C) (Temporal)   Resp 19   Ht _0  (1.575 m)   Wt 191 lb (86.6 kg)   SpO2 97%   BMI 34.93 kg/m  Wt Readings from Last 3 Encounters:  02/16/19 191 lb (86.6 kg)  02/04/19 200 lb 12.8 oz (91.1 kg)  01/29/19 198 lb 6.6 oz (90 kg)     Lab Results  Component Value Date   WBC 5.3 01/31/2019   HGB 12.0 01/31/2019   HCT 36.2 01/31/2019   PLT 202 01/31/2019   GLUCOSE 123 (H) 01/31/2019   CHOL 189 11/06/2018   TRIG 114.0 11/06/2018   HDL 105.60 11/06/2018   LDLDIRECT 95.9 10/06/2012   LDLCALC 61 11/06/2018   ALT 19 01/28/2019   AST 29 01/28/2019   NA 140 01/31/2019   K 3.5 01/31/2019   CL 105 01/31/2019    CREATININE 0.89 01/31/2019   BUN 19 01/31/2019   CO2 24 01/31/2019   TSH 1.250 05/27/2017   INR 2.0 (H) 01/29/2019   HGBA1C 6.8 (H) 11/06/2018   MICROALBUR 2.1 (H) 11/06/2018    DG Chest 2 View  Result Date: 01/27/2019 CLINICAL DATA:  Acute shortness of breath EXAM: CHEST - 2 VIEW COMPARISON:  01/29/2018 FINDINGS: The cardiomediastinal  silhouette is unremarkable. Minimal subsegmental atelectasis/scarring within the UPPER RIGHT lung and LOWER LEFT lung noted. There is no evidence of focal airspace disease, pulmonary edema, suspicious pulmonary nodule/mass, pleural effusion, or pneumothorax. No acute bony abnormalities are identified. IMPRESSION: No evidence of acute cardiopulmonary disease. Electronically Signed   By: Margarette Canada M.D.   On: 01/27/2019 12:34   CT Angio Chest PE W/Cm &/Or Wo Cm  Result Date: 01/27/2019 CLINICAL DATA:  Shortness of breath EXAM: CT ANGIOGRAPHY CHEST WITH CONTRAST TECHNIQUE: Multidetector CT imaging of the chest was performed using the standard protocol during bolus administration of intravenous contrast. Multiplanar CT image reconstructions and MIPs were obtained to evaluate the vascular anatomy. CONTRAST:  173m OMNIPAQUE IOHEXOL 350 MG/ML SOLN COMPARISON:  None. FINDINGS: Cardiovascular: Satisfactory opacification of the pulmonary arteries. Filling defects reflecting acute pulmonary emboli are present within the pulmonary arteries beginning at the bifurcation of the main pulmonary artery with additional lobar, segmental, and subsegmental involvement bilaterally. There is enlargement of the right ventricle relative to the left with straightening of the interventricular septum. Mediastinum/Nodes: No adenopathy. Lungs/Pleura: Central airways are patent. Patchy ground-glass density likely related to altered perfusion and atelectasis. No pleural effusion or pneumothorax. Upper Abdomen: Small hiatal hernia. Musculoskeletal: Mild degenerative changes of the included spine.  Review of the MIP images confirms the above findings. IMPRESSION: Positive for acute PE with CT evidence of right heart strain (RV/LV Ratio = 1.7) consistent with at least submassive (intermediate risk) PE. The presence of right heart strain has been associated with an increased risk of morbidity and mortality. Please activate Code PE by paging [phone] These results were called by telephone at the time of interpretation on 01/27/2019 at 5:01 pm to provider [facility], who verbally acknowledged these results. Electronically Signed   By: PMacy MisM.D.   On: 01/27/2019 17:08   ECHOCARDIOGRAM COMPLETE  Result Date: 01/28/2019   ECHOCARDIOGRAM REPORT   Patient Name:   BGINNY LOOMERDate of Exam: 01/28/2019 Medical Rec #:  [id number]       Height:       62.0 in Accession #:    [id number]      Weight:       193.0 lb Date of Birth:  [birth year]       BSA:          1.88 m Patient Age:    89years         BP:           111/58 mmHg Patient Gender: F                HR:           89 bpm. Exam Location:  Inpatient Procedure: 2D Echo, Cardiac Doppler, Color Doppler and Intracardiac            Opacification Agent Indications:    Pulmonary embolus 415.19  History:        Patient has prior history of Echocardiogram examinations, most                 recent 04/16/2017. CHF, CAD, Signs/Symptoms:DOE; Risk                 Factors:Hypertension, Diabetes, Dyslipidemia and Former Smoker.                 Pulomary embolus.  Sonographer:    JPaulita FujitaRDCS Referring Phys: [id number] 1. Severely dilated RV with  severely reduced function. Septum is flattened in systole/diastole consistent with RV pressure overload. RVSP is moderately elevated on this study. Findings consistent with submassive PE on review of medical record and CTA chest. Findings communicated to [name], MD @ 5:25 PM.  2. Left ventricular ejection fraction, by visual estimation, is 55 to 60%. The left ventricle has normal  function. There is no left ventricular hypertrophy.  3. Definity contrast agent was given IV to delineate the left ventricular endocardial borders.  4. Right ventricular volume and pressure overload.  5. The left ventricle demonstrates regional wall motion abnormalities.  6. Global right ventricle has severely reduced systolic function.The right ventricular size is severely enlarged. No increase in right ventricular wall thickness.  7. Left atrial size was normal.  8. Right atrial size was mildly dilated.  9. Presence of pericardial fat pad. 10. The pericardial effusion is circumferential. 11. Trivial pericardial effusion is present. 12. Mild mitral annular calcification. 13. The mitral valve is degenerative. No evidence of mitral valve regurgitation. 14. The tricuspid valve is grossly normal. Tricuspid valve regurgitation is mild. 15. The aortic valve is tricuspid. Aortic valve regurgitation is not visualized. No evidence of aortic valve sclerosis or stenosis. 16. Pulmonic regurgitation is mild. 17. The pulmonic valve was grossly normal. Pulmonic valve regurgitation is mild. 18. Moderately elevated pulmonary artery systolic pressure. 19. The tricuspid regurgitant velocity is 3.55 m/s, and with an assumed right atrial pressure of 8 mmHg, the estimated right ventricular systolic pressure is moderately elevated at 58.4 mmHg. 20. The inferior vena cava is normal in size with <50% respiratory variability, suggesting right atrial pressure of 8 mmHg. 21. Changes from prior study are noted. 22. A prior study was performed on 04/16/2017. 23. RV severely dilated and with reduced function compared with prior. FINDINGS  Left Ventricle: Left ventricular ejection fraction, by visual estimation, is 55 to 60%. The left ventricle has normal function. Definity contrast agent was given IV to delineate the left ventricular endocardial borders. The left ventricle demonstrates regional wall motion abnormalities. The left ventricular  internal cavity size was the left ventricle is normal in size. There is no left ventricular hypertrophy. The interventricular septum is flattened in systole and diastole, consistent with right ventricular pressure and volume overload. The left ventricular diastology could not be evaluated due to abnormal septal motion. Right Ventricle: The right ventricular size is severely enlarged. No increase in right ventricular wall thickness. Global RV systolic function is has severely reduced systolic function. The tricuspid regurgitant velocity is 3.55 m/s, and with an assumed right atrial pressure of 8 mmHg, the estimated right ventricular systolic pressure is moderately elevated at 58.4 mmHg. Left Atrium: Left atrial size was normal in size. Right Atrium: Right atrial size was mildly dilated Pericardium: Trivial pericardial effusion is present. The pericardial effusion is circumferential. Presence of pericardial fat pad. Mitral Valve: The mitral valve is degenerative in appearance. Mild mitral annular calcification. No evidence of mitral valve regurgitation. Tricuspid Valve: The tricuspid valve is grossly normal. Tricuspid valve regurgitation is mild. Aortic Valve: The aortic valve is tricuspid. Aortic valve regurgitation is not visualized. The aortic valve is structurally normal, with no evidence of sclerosis or stenosis. Pulmonic Valve: The pulmonic valve was grossly normal. Pulmonic valve regurgitation is mild. Pulmonic regurgitation is mild. Aorta: The aortic root is normal in size and structure. Venous: The inferior vena cava is normal in size with less than 50% respiratory variability, suggesting right atrial pressure of 8 mmHg. IAS/Shunts: No atrial level shunt  detected by color flow Doppler. Additional Comments: A prior study was performed on 04/16/2017.  LEFT VENTRICLE PLAX 2D LVIDd:         3.85 cm       Diastology LVIDs:         2.56 cm       LV e' lateral:   4.35 cm/s LV PW:         0.69 cm       LV E/e'  lateral: 8.3 LV IVS:        0.67 cm       LV e' medial:    3.59 cm/s LVOT diam:     1.80 cm       LV E/e' medial:  10.0 LV SV:         40 ml LV SV Index:   20.14 LVOT Area:     2.54 cm  LV Volumes (MOD) LV area d, A4C:    20.60 cm LV area s, A4C:    15.00 cm LV major d, A4C:   6.93 cm LV major s, A4C:   6.15 cm LV vol d, MOD A4C: 51.3 ml LV vol s, MOD A4C: 30.9 ml LV SV MOD A4C:     51.3 ml RIGHT VENTRICLE RV S prime:     7.72 cm/s TAPSE (M-mode): 1.2 cm LEFT ATRIUM             Index       RIGHT ATRIUM          Index LA diam:        3.20 cm 1.70 cm/m  RA Area:     8.89 cm LA Vol (A2C):   24.6 ml 13.06 ml/m RA Volume:   17.20 ml 9.13 ml/m LA Vol (A4C):   18.4 ml 9.77 ml/m LA Biplane Vol: 21.9 ml 11.63 ml/m  AORTIC VALVE LVOT Vmax:   76.90 cm/s LVOT Vmean:  53.800 cm/s LVOT VTI:    0.123 m  AORTA Ao Root diam: 2.50 cm MITRAL VALVE                        TRICUSPID VALVE MV Area (PHT): 3.02 cm             TR Peak grad:   50.4 mmHg MV PHT:        72.79 msec           TR Vmax:        355.00 cm/s MV Decel Time: 251 msec MV E velocity: 35.90 cm/s 103 cm/s  SHUNTS MV A velocity: 60.60 cm/s 70.3 cm/s Systemic VTI:  0.12 m MV E/A ratio:  0.59       1.5       Systemic Diam: 1.80 cm  Eleonore Chiquito MD Electronically signed by Eleonore Chiquito MD Signature Date/Time: 01/28/2019/5:27:44 PM    Final    VAS [name] LOWER EXTREMITY VENOUS (DVT)  Result Date: 01/28/2019  Lower Venous Study Indications: Pulmonary embolism.  Limitations: Poor ultrasound/tissue interface, body habitus and patient positioning. Performing Technologist: Oliver Hum RVT  Examination Guidelines: A complete evaluation includes B-mode imaging, spectral Doppler, color Doppler, and power Doppler as needed of all accessible portions of each vessel. Bilateral testing is considered an integral part of a complete examination. Limited examinations for reoccurring indications may be performed as noted.   +---------+---------------+---------+-----------+----------+--------------+ RIGHT    CompressibilityPhasicitySpontaneityPropertiesThrombus Aging +---------+---------------+---------+-----------+----------+--------------+ CFV      Full  Yes      Yes                                 +---------+---------------+---------+-----------+----------+--------------+ SFJ      Full                                                        +---------+---------------+---------+-----------+----------+--------------+ FV Prox  Partial        No       No                   Acute          +---------+---------------+---------+-----------+----------+--------------+ FV Mid   Partial        No       No                   Acute          +---------+---------------+---------+-----------+----------+--------------+ FV DistalPartial        No       No                   Acute          +---------+---------------+---------+-----------+----------+--------------+ PFV      Full                                                        +---------+---------------+---------+-----------+----------+--------------+ POP      None           No       No                   Acute          +---------+---------------+---------+-----------+----------+--------------+ PTV      Partial                                      Acute          +---------+---------------+---------+-----------+----------+--------------+ PERO     None                                         Acute          +---------+---------------+---------+-----------+----------+--------------+ Gastroc  None                                         Acute          +---------+---------------+---------+-----------+----------+--------------+   +---------+---------------+---------+-----------+----------+--------------+ LEFT     CompressibilityPhasicitySpontaneityPropertiesThrombus Aging  +---------+---------------+---------+-----------+----------+--------------+ CFV      Full           Yes      Yes                                 +---------+---------------+---------+-----------+----------+--------------+ SFJ  Full                                                        +---------+---------------+---------+-----------+----------+--------------+ FV Prox  Full                                                        +---------+---------------+---------+-----------+----------+--------------+ FV Mid   Full                                                        +---------+---------------+---------+-----------+----------+--------------+ FV DistalFull                                                        +---------+---------------+---------+-----------+----------+--------------+ PFV      Full                                                        +---------+---------------+---------+-----------+----------+--------------+ POP      Full           Yes      Yes                                 +---------+---------------+---------+-----------+----------+--------------+ PTV      Full                                                        +---------+---------------+---------+-----------+----------+--------------+ PERO     Full                                                        +---------+---------------+---------+-----------+----------+--------------+     Summary: Right: Findings consistent with acute deep vein thrombosis involving the right femoral vein, right popliteal vein, right posterior tibial veins, right peroneal veins, and right gastrocnemius veins. No cystic structure found in the popliteal fossa. Left: There is no evidence of deep vein thrombosis in the lower extremity. However, portions of this examination were limited- see technologist comments above. No cystic structure found in the popliteal fossa.  *See table(s) above for  measurements and observations. Electronically signed by Curt Jews MD on 01/28/2019 at 8:48:50 PM.    Final      Assessment & Plan:  Plan  I am having Darion B. Eliasen maintain her cholecalciferol, CALCIUM-VITAMIN D PO,  Coenzyme Q10, (Flaxseed, Linseed, (FLAXSEED OIL PO)), Omega-3 Fatty Acids (FISH OIL PO), blood glucose meter kit and supplies, POTASSIUM CHLORIDE PO, True Metrix Blood Glucose Test, TRUEplus Lancets 33G, furosemide, metFORMIN, Praluent, Shingrix, Wheat Dextrin (BENEFIBER PO), apixaban, ergocalciferol, and rosuvastatin. We will continue to administer sodium chloride.  No orders of the defined types were placed in this encounter.   Problem List Items Addressed This Visit      Unprioritized   Chronic diastolic heart failure ([place])    Per cardiology      DM (diabetes mellitus) type II uncontrolled, periph vascular disorder ([place])    hgba1c to be checked , minimize simple carbs. Increase exercise as tolerated. Continue current meds       Essential hypertension    Well controlled, no changes to meds. Encouraged heart healthy diet such as the DASH diet and exercise as tolerated.       Relevant Orders   Lipid panel   Comprehensive metabolic panel   Hyperlipidemia associated with type 2 diabetes mellitus ([place])    Tolerating statin, encouraged heart healthy diet, avoid trans fats, minimize simple carbs and saturated fats. Increase exercise as tolerated      Relevant Orders   Lipid panel   Comprehensive metabolic panel   Hyperlipidemia LDL goal <70    Tolerating statin, encouraged heart healthy diet, avoid trans fats, minimize simple carbs and saturated fats. Increase exercise as tolerated      OSA (obstructive sleep apnea)    On cpap       Pulmonary emboli (HCC)    On lifetime eliquis         Other Visit Diagnoses    Uncontrolled type 2 diabetes mellitus with hyperglycemia ([place])    -  Primary   Relevant Orders   Hemoglobin A1c      Follow-up: Return in  about 3 months (around 05/17/2019), or if symptoms worsen or fail to improve, for annual exam, fasting, hypertension, hyperlipidemia, diabetes II.  Ann Held, DO

## 2019-02-16 NOTE — Assessment & Plan Note (Signed)
On cpap 

## 2019-02-16 NOTE — Assessment & Plan Note (Signed)
hgba1c to be checked, minimize simple carbs. Increase exercise as tolerated. Continue current meds  

## 2019-02-16 NOTE — Patient Instructions (Signed)
Pulmonary Embolism  A pulmonary embolism (PE) is a sudden blockage or decrease of blood flow in one or both lungs. Most blockages come from a blood clot that forms in the vein of a lower leg, thigh, or arm (deep vein thrombosis, DVT) and travels to the lungs. A clot is blood that has thickened into a gel or solid. PE is a dangerous and life-threatening condition that needs to be treated right away. What are the causes? This condition is usually caused by a blood clot that forms in a vein and moves to the lungs. In rare cases, it may be caused by air, fat, part of a tumor, or other tissue that moves through the veins and into the lungs. What increases the risk? The following factors may make you more likely to develop this condition:  Experiencing a traumatic injury, such as breaking a hip or leg.  Having: ? A spinal cord injury. ? Orthopedic surgery, especially hip or knee replacement. ? Any major surgery. ? A stroke. ? DVT. ? Blood clots or blood clotting disease. ? Long-term (chronic) lung or heart disease. ? Cancer treated with chemotherapy. ? A central venous catheter.  Taking medicines that contain estrogen. These include birth control pills and hormone replacement therapy.  Being: ? Pregnant. ? In the period of time after your baby is delivered (postpartum). ? Older than age 60. ? Overweight. ? A smoker, especially if you have other risks. What are the signs or symptoms? Symptoms of this condition usually start suddenly and include:  Shortness of breath during activity or at rest.  Coughing, coughing up blood, or coughing up blood-tinged mucus.  Chest pain that is often worse with deep breaths.  Rapid or irregular heartbeat.  Feeling light-headed or dizzy.  Fainting.  Feeling anxious.  Fever.  Sweating.  Pain and swelling in a leg. This is a symptom of DVT, which can lead to PE. How is this diagnosed? This condition may be diagnosed based on:  Your medical  history.  A physical exam.  Blood tests.  CT pulmonary angiogram. This test checks blood flow in and around your lungs.  Ventilation-perfusion scan, also called a lung VQ scan. This test measures air flow and blood flow to the lungs.  An ultrasound of the legs. How is this treated? Treatment for this condition depends on many factors, such as the cause of your PE, your risk for bleeding or developing more clots, and other medical conditions you have. Treatment aims to remove, dissolve, or stop blood clots from forming or growing larger. Treatment may include:  Medicines, such as: ? Blood thinning medicines (anticoagulants) to stop clots from forming. ? Medicines that dissolve clots (thrombolytics).  Procedures, such as: ? Using a flexible tube to remove a blood clot (embolectomy) or to deliver medicine to destroy it (catheter-directed thrombolysis). ? Inserting a filter into a large vein that carries blood to the heart (inferior vena cava). This filter (vena cava filter) catches blood clots before they reach the lungs. ? Surgery to remove the clot (surgical embolectomy). This is rare. You may need a combination of immediate, long-term (up to 3 months after diagnosis), and extended (more than 3 months after diagnosis) treatments. Your treatment may continue for several months (maintenance therapy). You and your health care provider will work together to choose the treatment program that is best for you. Follow these instructions at home: Medicines  Take over-the-counter and prescription medicines only as told by your health care provider.  If you   are taking an anticoagulant medicine: ? Take the medicine every day at the same time each day. ? Understand what foods and drugs interact with your medicine. ? Understand the side effects of this medicine, including excessive bruising or bleeding. Ask your health care provider or pharmacist about other side effects. General instructions   Wear a medical alert bracelet or carry a medical alert card that says you have had a PE and lists what medicines you take.  Ask your health care provider when you may return to your normal activities. Avoid sitting or lying for a long time without moving.  Maintain a healthy weight. Ask your health care provider what weight is healthy for you.  Do not use any products that contain nicotine or tobacco, such as cigarettes, e-cigarettes, and chewing tobacco. If you need help quitting, ask your health care provider.  Talk with your health care provider about any travel plans. It is important to make sure that you are still able to take your medicine while on trips.  Keep all follow-up visits as told by your health care provider. This is important. Contact a health care provider if:  You missed a dose of your blood thinner medicine. Get help right away if:  You have: ? New or increased pain, swelling, warmth, or redness in an arm or leg. ? Numbness or tingling in an arm or leg. ? Shortness of breath during activity or at rest. ? A fever. ? Chest pain. ? A rapid or irregular heartbeat. ? A severe headache. ? Vision changes. ? A serious fall or accident, or you hit your head. ? Stomach (abdominal) pain. ? Blood in your vomit, stool, or urine. ? A cut that will not stop bleeding.  You cough up blood.  You feel light-headed or dizzy.  You cannot move your arms or legs.  You are confused or have memory loss. These symptoms may represent a serious problem that is an emergency. Do not wait to see if the symptoms will go away. Get medical help right away. Call your local emergency services (911 in the [place] Do not drive yourself to the hospital. Summary  A pulmonary embolism (PE) is a sudden blockage or decrease of blood flow in one or both lungs. PE is a dangerous and life-threatening condition that needs to be treated right away.  Treatments for this condition usually include  medicines to thin your blood (anticoagulants) or medicines to break apart blood clots (thrombolytics).  If you are given blood thinners, it is important to take the medicine every day at the same time each day.  Understand what foods and drugs interact with any medicines that you are taking.  If you have signs of PE or DVT, call your local emergency services (911 in the [place] This information is not intended to replace advice given to you by your health care provider. Make sure you discuss any questions you have with your health care provider. Document Released: 02/03/2000 Document Revised: 11/13/2017 Document Reviewed: 11/13/2017 Elsevier Patient Education  2020 Elsevier Inc.  

## 2019-02-24 ENCOUNTER — Telehealth: Payer: Self-pay | Admitting: Cardiology

## 2019-02-24 NOTE — Telephone Encounter (Signed)
Prior auth submitted, response was "your PA has been resolved, no additional PA is required."  [facility] who states Praluent automatically got sent to their specialty dept and they can't access the prescription right now. They stated they don't know when they will get it back and can't provide any other information. They stated this happens automatically when rx is over a certain dollar amount. Asked if the same issue happened with her Eliquis since it's the same price and they said no.  Advised pt that it sounds like [facility] is processing her rx and I'll call back in a few days with an update.

## 2019-02-24 NOTE — Telephone Encounter (Signed)
Returned call to pt. She changed insurances 02/19/18 - [employer] is now primary insurance and she no longer has [employer]. Advised pt new authorization will need to be sent, this has been done. Will call pt back once PA is approved.  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 They messed this up previously as well and kept unlinking the grant info from her rx even though they have it in their sytem. Will call and confirm they can find it again and just relink it to her rx once PA is approved.

## 2019-02-24 NOTE — Telephone Encounter (Signed)
Pt c/o medication issue:  1. Name of Medication: Alirocumab ([place]) 75 MG/ML SOAJ  2. How are you currently taking this medication (dosage and times per day)? As directed  3. Are you having a reaction (difficulty breathing--STAT)? No  4. What is your medication issue? [facility] will not let her pick up this prescription. She is not sure if it has something to do with switching insurance. She states she spoke with Jinny Blossom about this prescription before and would like to speak with her again.

## 2019-02-26 NOTE — Telephone Encounter (Signed)
Pharmacy still apparently unable to process rx. Pt picking up 1 Praluent sample today as she is due for her next dose on Monday.

## 2019-03-02 MED ORDER — APIXABAN 5 MG PO TABS: 5.0000 mg | ORAL_TABLET | Freq: Two times a day (BID) | ORAL | 1 refills | Status: DC

## 2019-03-02 NOTE — Telephone Encounter (Signed)
S[facility] again, they stated they still cannot process Praluent rx. Stated when they try to bill her [facility] [employer], it states they must bill her primary insurance first. Pt does not have other prescription coverage - she did last year but changed to a [employer] plan this year that does not have prescription benefits. They stated pt would need to call her insurance to have them remove whatever primary insurance from last year might still be listed. Pt is aware to do this and will call with any issues.

## 2019-03-03 ENCOUNTER — Other Ambulatory Visit: Payer: Self-pay

## 2019-03-03 ENCOUNTER — Encounter: Payer: Self-pay | Admitting: Family Medicine

## 2019-03-03 ENCOUNTER — Telehealth: Payer: Self-pay | Admitting: *Deleted

## 2019-03-03 ENCOUNTER — Ambulatory Visit (HOSPITAL_COMMUNITY): Payer: Medicare PPO | Attending: Cardiology

## 2019-03-03 DIAGNOSIS — E119 Type 2 diabetes mellitus without complications: Secondary | ICD-10-CM | POA: Insufficient documentation

## 2019-03-03 DIAGNOSIS — G4730 Sleep apnea, unspecified: Secondary | ICD-10-CM | POA: Insufficient documentation

## 2019-03-03 DIAGNOSIS — M7989 Other specified soft tissue disorders: Secondary | ICD-10-CM | POA: Insufficient documentation

## 2019-03-03 DIAGNOSIS — I080 Rheumatic disorders of both mitral and aortic valves: Secondary | ICD-10-CM | POA: Diagnosis not present

## 2019-03-03 DIAGNOSIS — I509 Heart failure, unspecified: Secondary | ICD-10-CM | POA: Insufficient documentation

## 2019-03-03 DIAGNOSIS — I110 Hypertensive heart disease with heart failure: Secondary | ICD-10-CM | POA: Diagnosis not present

## 2019-03-03 DIAGNOSIS — I2699 Other pulmonary embolism without acute cor pulmonale: Secondary | ICD-10-CM | POA: Diagnosis present

## 2019-03-03 NOTE — Telephone Encounter (Signed)
Copied from [place] [phone]. Topic: General - Other >> Mar 03, 2019 11:42 AM Greggory Keen D wrote: Reason for CRM: Pt called wanting  to know if it is ok for her to take the covid vaccine.  She was in the hospital for a blood clot in Dec and was put on Eliquis.  CB#  [phone]

## 2019-03-04 ENCOUNTER — Encounter: Payer: Self-pay | Admitting: Family Medicine

## 2019-03-04 NOTE — Telephone Encounter (Signed)
Pt notified through [place] message

## 2019-03-04 NOTE — Progress Notes (Signed)
Cardiology Office Note   Date:  03/05/2019   ID:  Sierra Ford, DOB Sep 16, 1940, MRN [id number]  PCP:  Sierra Ford, Sierra Apa, DO  Cardiologist:   Sierra Breeding, MD  Chief Complaint  Patient presents with  . Pulmonary embolism    History of Present Illness: Sierra Ford is a 79 y.o. female who presents for follow up of diastolic dysfunction and abnormal echo. Echo 04/16/17  demonstrated an EF of 65%I saw her last year and she returns for follow up.   She had a pulmonary embolism.  I saw her after this.  She had RV strain.  I put her on O2 at the last visit.  I did repeat an echo and there was no further evidence of RV strain.  Since I last saw her she has done much better.  Her oxygen sats today 100% sitting and 91walking.  She did get oxygen for as needed use.  She thinks her breathing is better but not at baseline.  She is not having any new chest pressure, neck or arm discomfort.  She has had no new weight gain or edema.   Past Medical History:  Diagnosis Date  . Anxiety   . Back pain   . Chest pain    CLite with apical ischemia in 2006 - normal coronary arteries by [facility] in 12/2004;  Myoview 11/12:  Low risk stress nuclear study with a small, partially reversible apical defect most likely related to apical thinning; cannot R/O very mild apical ischemia.  EF: 75%   . Decreased hearing   . Depression   . Diabetes ([place])   . DVT (deep venous thrombosis) ([place]ierra [place]) 2006   hx of  . Ectopic pregnancy with intrauterine pregnancy   . Hiatal hernia   . Hyperlipidemia   . PE (pulmonary embolism) 2006   hx of  . Sleep apnea    CPAP     Past Surgical History:  Procedure Laterality Date  . ABDOMINAL EXPLORATION SURGERY    . BUNIONECTOMY     right  . COLONOSCOPY  2009  . ECTOPIC PREGNANCY SURGERY    . FOOT SURGERY    . TUBAL LIGATION       Current Outpatient Medications  Medication Sig Dispense Refill  . Alirocumab (PRALUENT) 75 MG/ML SOAJ Inject 1 pen into the skin every  14 (fourteen) days. 2 pen 11  . apixaban (ELIQUIS) 5 MG TABS tablet Take 1 tablet (5 mg total) by mouth 2 (two) times daily. 180 tablet 1  . blood glucose meter kit and supplies KIT Dispense based on patient and insurance preference. Use up to four times daily as directed. (FOR ICD-9 250.00, 250.01). 1 each 0  . CALCIUM-VITAMIN D PO Take 600 Units by mouth 2 (two) times daily.      . cholecalciferol (VITAMIN D-400) 400 UNITS TABS Take 400 Units by mouth 2 (two) times daily.     . Coenzyme Q10 100 MG TABS Take 100 mg by mouth daily.    . Flaxseed, Linseed, (FLAXSEED OIL PO) Take 1,000 mg by mouth daily.     . furosemide (LASIX) 40 MG tablet Take 1 tablet (40 mg total) by mouth daily. 90 tablet 2  . metFORMIN (GLUCOPHAGE) 500 MG tablet Take 1 tablet (500 mg total) by mouth 2 (two) times daily with a meal. 180 tablet 2  . Multiple Vitamin (MULTIVITAMIN) capsule Take 1 capsule by mouth daily.    . Omega-3 Fatty Acids (FISH OIL PO)  Take 1,200 mg by mouth daily.     Marland Kitchen POTASSIUM CHLORIDE PO Take 1 tablet by mouth as needed (muscle cramps).     . rosuvastatin (CRESTOR) 10 MG tablet Take 1 tablet (10 mg total) by mouth daily. 90 tablet 3  . TRUE METRIX BLOOD GLUCOSE TEST test strip TEST  UP  TO FOUR TIMES DAILY AS DIRECTED 200 each 12  . TRUEPLUS LANCETS 33G MISC TEST  UP TO FOUR TIMES DAILY AS DIRECTED 200 each 12  . Wheat Dextrin (BENEFIBER PO) Take 30 mLs by mouth 2 (two) times daily.    Marland Kitchen Zoster Vaccine Adjuvanted [facility]) injection 0.71m IM x1  Repeat in 2-6 months 0.5 mL 1  . ergocalciferol (VITAMIN D2) 1.25 MG (50000 UT) capsule Vitamin D2 1,250 mcg (50,000 unit) capsule     Current Facility-Administered Medications  Medication Dose Route Frequency Provider Last Rate Last Admin  . 0.9 %  sodium chloride infusion  500 mL Intravenous Once Nandigam, KVenia Minks MD        Allergies:   Patient has no known allergies.     ROS:  Please see the history of present illness.   Otherwise, review of  systems are positive for none.   All other systems are reviewed and negative.    PHYSICAL EXAM: VS:  BP 102/65   Pulse 79   Temp (!) 97.3 F (36.3 C)   Ht _0  (1.575 m)   Wt 190 lb (86.2 kg)   SpO2 100%   BMI 34.75 kg/m  , BMI Body mass index is 34.75 kg/m. GENERAL:  Well appearing NECK:  No jugular venous distention, waveform within normal limits, carotid upstroke brisk and symmetric, no bruits, no thyromegaly LUNGS:  Clear to auscultation bilaterally CHEST:  Unremarkable HEART:  PMI not displaced or sustained,S1 and S2 within normal limits, no S3, no S4, no clicks, no rubs, no murmurs ABD:  Flat, positive bowel sounds normal in frequency in pitch, no bruits, no rebound, no guarding, no midline pulsatile mass, no hepatomegaly, no splenomegaly EXT:  2 plus pulses throughout, no edema, no cyanosis no clubbing  EKG:  EKG is ordered today. Sinus rhythm, rate 79, left axis deviation, left anterior fascicular block, poor anterior R wave progression, no acute ST-T wave changes.  Recent Labs: 01/31/2019: Hemoglobin 12.0; Magnesium 2.2; Platelets 202 02/16/2019: ALT 17; BUN 13; Creatinine, Ser 0.89; Potassium 4.3; Sodium 141    Lipid Panel    Component Value Date/Time   CHOL 157 02/16/2019 1200   CHOL 209 (H) 08/26/2018 1018   TRIG 86.0 02/16/2019 1200   HDL 84.60 02/16/2019 1200   HDL 101 08/26/2018 1018   CHOLHDL 2 02/16/2019 1200   VLDL 17.2 02/16/2019 1200   LDLCALC 55 02/16/2019 1200   LDLCALC 88 08/26/2018 1018   LDLDIRECT 95.9 10/06/2012 1248     Lab Results  Component Value Date   HGBA1C 6.7 (H) 02/16/2019     Wt Readings from Last 3 Encounters:  03/05/19 190 lb (86.2 kg)  02/16/19 191 lb (86.6 kg)  02/04/19 200 lb 12.8 oz (91.1 kg)      Other studies Reviewed: Additional studies/ records that were reviewed today include:   None Review of the above records demonstrates:  NA   ASSESSMENT AND PLAN:  ACUTE PULMONARY EMBOLISM:     I am going to keep her  on the current dose of Eliquis but will reassess in June as it will have been over 6 months.  She will  be on lifelong anticoagulation but perhaps at a maintenance dose.  For now she will continue as needed oxygen.    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  No change in therapy.  DM:A1c was 6.7 in late December.  No change in therapy.   SLEEP APNEA:She wears CPAP.  No change in therapy.   DYSLIPIDEMIA:   LDL in late December since I saw her was 76 and HDL 84.  She will continue with Crestor.   COVID EDUCATION:   She is getting the vaccine tomorrow.   Current medicines are reviewed at length with the patient today.  The patient does not have concerns regarding medicines.  The following changes have been made:  None  Labs/ tests ordered today include:  None  Orders Placed This Encounter  Procedures  . EKG 12-Lead     Disposition:   FU with me in June    Signed, Drystan Reader, MD  03/05/2019 3:25 PM    [facility]

## 2019-03-04 NOTE — Telephone Encounter (Signed)
Telephone message sent to [facility] to confirm vaccine

## 2019-03-04 NOTE — Telephone Encounter (Signed)
Only concern is hematoma (bruise) at injection site--- otherwise no contraindication

## 2019-03-04 NOTE — Telephone Encounter (Signed)
Any reason patient should not get vaccine?

## 2019-03-05 ENCOUNTER — Encounter: Payer: Self-pay | Admitting: Cardiology

## 2019-03-05 ENCOUNTER — Other Ambulatory Visit: Payer: Self-pay

## 2019-03-05 ENCOUNTER — Ambulatory Visit (INDEPENDENT_AMBULATORY_CARE_PROVIDER_SITE_OTHER): Payer: Medicare PPO | Admitting: Cardiology

## 2019-03-05 VITALS — BP 102/65 | HR 79 | Temp 97.3°F | Ht 62.0 in | Wt 190.0 lb

## 2019-03-05 DIAGNOSIS — I10 Essential (primary) hypertension: Secondary | ICD-10-CM

## 2019-03-05 DIAGNOSIS — R0602 Shortness of breath: Secondary | ICD-10-CM

## 2019-03-05 DIAGNOSIS — I5032 Chronic diastolic (congestive) heart failure: Secondary | ICD-10-CM | POA: Diagnosis not present

## 2019-03-05 DIAGNOSIS — E119 Type 2 diabetes mellitus without complications: Secondary | ICD-10-CM

## 2019-03-05 DIAGNOSIS — I2609 Other pulmonary embolism with acute cor pulmonale: Secondary | ICD-10-CM | POA: Diagnosis not present

## 2019-03-05 DIAGNOSIS — Z7189 Other specified counseling: Secondary | ICD-10-CM

## 2019-03-05 NOTE — Patient Instructions (Addendum)
Medication Instructions:  No changes Eliquis assistance application given *If you need a refill on your cardiac medications before your next appointment, please call your pharmacy*  Lab Work: None  Testing/Procedures: None  Follow-Up: At [facility], you and your health needs are our priority.  As part of our continuing mission to provide you with exceptional heart care, we have created designated Provider Care Teams.  These Care Teams include your primary Cardiologist (physician) and Advanced Practice Providers (APPs -  Physician Assistants and Nurse Practitioners) who all work together to provide you with the care you need, when you need it.  Your next appointment:   6 month(s)  Your physician wants you to follow-up in July. You will receive a reminder letter in the mail two months in advance. If you don't receive a letter, please call our office to schedule the follow-up appointment.   The format for your next appointment:   In Person  Provider:   Minus Breeding, MD  Other Instructions Purchase a pulse oximeter to monitor your heart rate and oxygen levels.

## 2019-03-09 ENCOUNTER — Encounter: Payer: Self-pay | Admitting: Family Medicine

## 2019-03-11 ENCOUNTER — Telehealth: Payer: Self-pay | Admitting: Cardiology

## 2019-03-11 NOTE — Telephone Encounter (Signed)
New Message     Sierra Ford is calling from [facility] about a prior authorization for Alirocumab ([place]) 23 MG/ML SOAJ   Please advise

## 2019-03-11 NOTE — Telephone Encounter (Signed)
Patient left voicemail about [facility] to speak with [name] about Praluent. [facility] patient needs prior auth bc she changed insurances. PA has already been submitted earlier today. Patient made aware

## 2019-03-11 NOTE — Telephone Encounter (Signed)
Called and spoke w/pharmacy resolutions desk. Apparently the pt switched to [employer]. Submitting a pa now

## 2019-03-23 DIAGNOSIS — E559 Vitamin D deficiency, unspecified: Secondary | ICD-10-CM | POA: Diagnosis not present

## 2019-03-23 DIAGNOSIS — M159 Polyosteoarthritis, unspecified: Secondary | ICD-10-CM | POA: Diagnosis not present

## 2019-03-23 DIAGNOSIS — E1122 Type 2 diabetes mellitus with diabetic chronic kidney disease: Secondary | ICD-10-CM | POA: Diagnosis not present

## 2019-03-23 DIAGNOSIS — E785 Hyperlipidemia, unspecified: Secondary | ICD-10-CM | POA: Diagnosis not present

## 2019-03-23 DIAGNOSIS — E669 Obesity, unspecified: Secondary | ICD-10-CM | POA: Diagnosis not present

## 2019-03-23 DIAGNOSIS — Z6834 Body mass index (BMI) 34.0-34.9, adult: Secondary | ICD-10-CM | POA: Diagnosis not present

## 2019-03-23 DIAGNOSIS — N189 Chronic kidney disease, unspecified: Secondary | ICD-10-CM | POA: Diagnosis not present

## 2019-03-23 DIAGNOSIS — G4733 Obstructive sleep apnea (adult) (pediatric): Secondary | ICD-10-CM | POA: Diagnosis not present

## 2019-03-23 DIAGNOSIS — I129 Hypertensive chronic kidney disease with stage 1 through stage 4 chronic kidney disease, or unspecified chronic kidney disease: Secondary | ICD-10-CM | POA: Diagnosis not present

## 2019-03-31 NOTE — Discharge Summary (Addendum)
Sierra Ford, is a 79 y.o. female  DOB February 18, 1941  MRN [id number].  Admission date:  01/27/2019  Admitting Physician  Donne Hazel, MD  Discharge Date:  01/31/2019   Primary MD  Ann Held, DO  Recommendations for primary care physician for things to follow:  CBC, A1C   Admission Diagnosis  Hypoxia [R09.02] Dyspnea, unspecified type [R06.00] Pulmonary emboli [facility]) [I26.99]   Discharge Diagnosis  Hypoxia [R09.02] Dyspnea, unspecified type [R06.00] Pulmonary emboli ([place]) [I26.99]    Principal Problem:   Acute pulmonary embolism with acute cor pulmonale (HCC) Active Problems:   Hyperlipidemia LDL goal <70   Chronic diastolic heart failure (HCC)   Personal history of venous thrombosis and embolism   Obesity (BMI 30-39.9)   DM (diabetes mellitus) type II uncontrolled, periph vascular disorder ([place])   Essential hypertension   Acute hypoxemic respiratory failure (HCC)   Pulmonary emboli (HCC)      Past Medical History:  Diagnosis Date  . Anxiety   . Back pain   . Chest pain    CLite with apical ischemia in 2006 - normal coronary arteries by [facility] in 12/2004;  Myoview 11/12:  Low risk stress nuclear study with a small, partially reversible apical defect most likely related to apical thinning; cannot R/O very mild apical ischemia.  EF: 75%   . Decreased hearing   . Depression   . Diabetes ([place])   . DVT (deep venous thrombosis) ([place]) 2006   hx of  . Ectopic pregnancy with intrauterine pregnancy   . Hiatal hernia   . Hyperlipidemia   . PE (pulmonary embolism) 2006   hx of  . Sleep apnea    CPAP     Past Surgical History:  Procedure Laterality Date  . ABDOMINAL EXPLORATION SURGERY    . BUNIONECTOMY     right  . COLONOSCOPY  2009  . ECTOPIC PREGNANCY SURGERY    . FOOT SURGERY    . TUBAL LIGATION         HPI  from the history and physical done on the day of  admission:    Sierra Ford is a 79 y.o. female with medical history significant of obesity, DM, HTN, HLD presening to the ED with progressive sob. SOB first began 4-5 days prior to ED visit. Symptoms progressed and pt believed her sob was related to CHF. Pt was scheduled to see her Cardiologist on the day of ED visit, however on the morning of visit, pt developed acute worsening of SOB resulting in near syncopal episode. Pt subsequently presented to the ED for further work up.. Denies fevers, sick contacts. Pt reports recent negative covid test prior to ED visit.   Of note, pt has prior hx of R LE DVT and PE treated with coumadin x 68yr  ED Course: In the ED, patient noted to have unremarkable CXR. Follow up CT notable for acute PE with R heart strain. Patient was started on heparin gtt. Hospitalist service consulted for consideration for admission.  COVID testing was negative Review of Systems:  Review of Systems  Constitutional: Negative for chills, fever and malaise/fatigue.  HENT: Negative for congestion, ear pain and nosebleeds.   Eyes: Negative for double vision and photophobia.  Respiratory: Positive for shortness of breath. Negative for hemoptysis, sputum production and wheezing.   Cardiovascular: Negative for palpitations and claudication.  Gastrointestinal: Negative for abdominal pain, nausea and vomiting.  Genitourinary: Negative for frequency, hematuria and urgency.  Musculoskeletal: Negative for back pain, joint pain and neck pain.  Neurological: Negative for tingling, tremors and seizures.       Near syncope  Psychiatric/Behavioral: Negative for hallucinations and memory loss. The patient is not nervous/anxious.       Hospital Course:    Sierra Ford a 79 y.o.femalewith medical history significant ofobesity, DM, HTN, HLD preseted to the ED with progressive sob.  Shortness of breath first began 4-5 days prior to ED visit. Symptoms progressed and patient  believed her sob was related to CHF. Pt was scheduled to see her Cardiologist on the day of ED visit, however on the morning of visit, pt developed acute worsening of SOB resulting in near syncopal episode. Pt subsequently, presented to the ED for further work up. Patient has prior hx of Right LE DVT and PE treated with coumadin x 31yr  ED Course:In the ED, patient noted to have unremarkable CXR. Follow up CT notable for acute PE with R heart strain. Patient was started on heparin gtt. Hospitalist service consulted for consideration for admission.  COVID-19 was negative.   Acute hypoxic respiratory failure secondary to acute pulmonary embolism with R heart strain Patient remained symptomatic during hospitalization requiring almost 4 L of oxygen.  Subsequently 2D echocardiogram was performed which showed the significant RV strain.  Critical care was consulted and patient was subsequently transferred to the ICU for thrombolytic treatment.  Status post thrombolytic treatment patient has overall significantly felt better, but required on oxygen yesterday,    Ultrasound of the lower extremity showrd acute deep vein thrombosis involving the right femoral vein, right popliteal vein, right posterior tibial veins, right peroneal veins, and right gastrocnemius veins.    Heparin drip was switched to Eliquis yesterday, followed by weaning off wean oxygen as tolerated with PT follow up regarding walking pt as to O2 requirement.   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  Patient is aware of the risk versus benefits of anticoagulation.  Right lower extremity DVT.   Was switched to Eliquis from heparin drip.  Chronic diastolic CHF Chest x-ray was unremarkable.  Continued on p.o. Lasix.  Obesity Lifestyle modification advised  Diabetes mellitus type 2. Continued sliding scale insulin.  A1c 6.8 on 11/06/2018. Out-patient follow-up - started on  metformin. Essential HTN Blood pressure remained stable though patient was slightly tachycardic on presentation..Tachycardia subsequently improved after thrombolytics.  History of obstructive sleep apnea.  Resumed CPAP at nighttime.      Consultants:  Critical care  Cardiology  Procedures:  Thrombolytic treatment status post TPA     Discharge Condition: improved and satisfactory  Follow UP     Diet and Activity recommendation:  As advised  Discharge Instructions    Discharge Instructions    Call MD for:  difficulty breathing, headache or visual disturbances   Complete by: As directed    Call MD for:  extreme fatigue   Complete by: As directed    Call MD for:  persistant dizziness or light-headedness   Complete by: As  directed    Call MD for:  persistant nausea and vomiting   Complete by: As directed    Call MD for:  redness, tenderness, or signs of infection (pain, swelling, redness, odor or green/yellow discharge around incision site)   Complete by: As directed    Call MD for:  severe uncontrolled pain   Complete by: As directed    Call MD for:  temperature >100.4   Complete by: As directed    Diet - low sodium heart healthy   Complete by: As directed    Increase activity slowly   Complete by: As directed         Discharge Medications     Allergies as of 01/31/2019   No Known Allergies     Medication List    STOP taking these medications   aspirin EC 81 MG tablet   etodolac 400 MG tablet Commonly known as: LODINE   hydrocortisone 2.5 % rectal cream Commonly known as: Anusol-HC   hydrocortisone 25 MG suppository Commonly known as: ANUSOL-HC   tiZANidine 2 MG tablet Commonly known as: ZANAFLEX     TAKE these medications   BENEFIBER PO Take 30 mLs by mouth 2 (two) times daily.   blood glucose meter kit and supplies Kit Dispense based on patient and insurance preference. Use up to four times daily as directed. (FOR ICD-9 250.00,  250.01).   CALCIUM-VITAMIN D PO Take 600 Units by mouth 2 (two) times daily.   Coenzyme Q10 100 MG Tabs Take 100 mg by mouth daily. Notes to patient: Take tomorrow AM 02/01/2019   FISH OIL PO Take 1,200 mg by mouth daily. Notes to patient: Take tomorrow AM 02/01/2019   FLAXSEED OIL PO Take 1,000 mg by mouth daily. Notes to patient: Take tomorrow AM 02/01/2019   furosemide 40 MG tablet Commonly known as: LASIX Take 1 tablet (40 mg total) by mouth daily. Notes to patient: Take tomorrow AM 02/01/2019   metFORMIN 500 MG tablet Commonly known as: GLUCOPHAGE Take 1 tablet (500 mg total) by mouth 2 (two) times daily with a meal.   POTASSIUM CHLORIDE PO Take 1 tablet by mouth as needed (muscle cramps). Notes to patient: Take as needed for muscle cramps.    Praluent 75 MG/ML Soaj Generic drug: Alirocumab Inject 1 pen into the skin every 14 (fourteen) days. Notes to patient: Inject one pen into skin every 14 days   Shingrix injection Generic drug: Zoster Vaccine Adjuvanted 0.4m IM x1  Repeat in 2-6 months   True Metrix Blood Glucose Test test strip Generic drug: glucose blood TEST  UP  TO FOUR TIMES DAILY AS DIRECTED   TRUEplus Lancets 33G Misc TEST  UP TO FOUR TIMES DAILY AS DIRECTED   Vitamin D-400 10 MCG (400 UNIT) Tabs tablet Generic drug: cholecalciferol Take 400 Units by mouth 2 (two) times daily.       Major procedures and Radiology Reports - PLEASE review detailed and final reports for all details, in brief -     ECHOCARDIOGRAM COMPLETE  Result Date: 03/03/2019   ECHOCARDIOGRAM REPORT   Patient Name:   BKIMIKO COMMONDate of Exam: 03/03/2019 Medical Rec #:  [id number]       Height:       62.0 in Accession #:    [id number]      Weight:       191.0 lb Date of Birth:  [birth year]       BSA:  1.87 m Patient Age:    58 years         BP:           132/82 mmHg Patient Gender: F                HR:           74 bpm. Exam Location:  [address] Procedure:  2D Echo, Cardiac Doppler and Color Doppler Indications:    I26.99 Pulmonary embolus  History:        Patient has prior history of Echocardiogram examinations, most                 recent 01/28/2019. CHF, Signs/Symptoms:Shortness of Breath; Risk                 Factors:Hypertension and Diabetes. Diastolic dysfunction. Right                 lower extremity DVT. Lower extremity swelling. Back pain. Sleep                 apnea (uses CPAP).  Sonographer:    Diamond Nickel RCS Referring Phys: [place]  1. Left ventricular ejection fraction, by visual estimation, is 55 to 60%. The left ventricle has normal function. There is mildly increased left ventricular hypertrophy.  2. Left ventricular diastolic parameters are consistent with Grade I diastolic dysfunction (impaired relaxation).  3. The left ventricle has no regional wall motion abnormalities.  4. Global right ventricle has normal systolic function.The right ventricular size is normal.  5. Left atrial size was normal.  6. Right atrial size was normal.  7. Mild mitral annular calcification.  8. The mitral valve is normal in structure. No evidence of mitral valve regurgitation. No evidence of mitral stenosis.  9. The tricuspid valve is normal in structure. 10. The aortic valve is tricuspid. Aortic valve regurgitation is not visualized. Mild aortic valve sclerosis without stenosis. 11. The pulmonic valve was normal in structure. Pulmonic valve regurgitation is not visualized. 12. The inferior vena cava is normal in size with greater than 50% respiratory variability, suggesting right atrial pressure of 3 mmHg. 13. Normal LV systolic function; grade 1 diastolic dysfunction. FINDINGS  Left Ventricle: Left ventricular ejection fraction, by visual estimation, is 55 to 60%. The left ventricle has normal function. The left ventricle has no regional wall motion abnormalities. There is mildly increased left ventricular hypertrophy. Left ventricular diastolic  parameters are consistent with Grade I diastolic dysfunction (impaired relaxation). Normal left atrial pressure. Right Ventricle: The right ventricular size is normal.Global RV systolic function is has normal systolic function. Left Atrium: Left atrial size was normal in size. Right Atrium: Right atrial size was normal in size Pericardium: There is no evidence of pericardial effusion. Mitral Valve: The mitral valve is normal in structure. Mild mitral annular calcification. No evidence of mitral valve regurgitation. No evidence of mitral valve stenosis by observation. Tricuspid Valve: The tricuspid valve is normal in structure. Tricuspid valve regurgitation is trivial. Aortic Valve: The aortic valve is tricuspid. Aortic valve regurgitation is not visualized. Mild aortic valve sclerosis is present, with no evidence of aortic valve stenosis. Pulmonic Valve: The pulmonic valve was normal in structure. Pulmonic valve regurgitation is not visualized. Pulmonic regurgitation is not visualized. Aorta: The aortic root is normal in size and structure. Venous: The inferior vena cava is normal in size with greater than 50% respiratory variability, suggesting right atrial pressure of 3 mmHg. IAS/Shunts: No atrial level  shunt detected by color flow Doppler. Additional Comments: Normal LV systolic function; grade 1 diastolic dysfunction.  LEFT VENTRICLE PLAX 2D LVIDd:         3.55 cm  Diastology LVIDs:         2.17 cm  LV e' lateral:   7.83 cm/s LV PW:         1.22 cm  LV E/e' lateral: 11.0 LV IVS:        1.05 cm  LV e' medial:    6.09 cm/s LVOT diam:     1.95 cm  LV E/e' medial:  14.2 LV SV:         37 ml LV SV Index:   18.62 LVOT Area:     2.99 cm  RIGHT VENTRICLE RV Basal diam:  2.24 cm RV S prime:     10.80 cm/s TAPSE (M-mode): 2.3 cm LEFT ATRIUM             Index       RIGHT ATRIUM           Index LA diam:        2.70 cm 1.44 cm/m  RA Area:     12.00 cm LA Vol (A2C):   45.6 ml 24.32 ml/m RA Volume:   27.70 ml  14.78 ml/m  LA Vol (A4C):   38.0 ml 20.27 ml/m LA Biplane Vol: 42.1 ml 22.46 ml/m  AORTIC VALVE LVOT Vmax:   85.70 cm/s LVOT Vmean:  61.000 cm/s LVOT VTI:    0.234 m  AORTA Ao Root diam: 2.90 cm MITRAL VALVE MV Area (PHT): 2.87 cm              SHUNTS MV PHT:        76.56 msec            Systemic VTI:  0.23 m MV Decel Time: 264 msec              Systemic Diam: 1.95 cm MV E velocity: 86.30 cm/s  103 cm/s MV A velocity: 109.00 cm/s 70.3 cm/s MV E/A ratio:  0.79        1.5  Kirk Ruths MD Electronically signed by Kirk Ruths MD Signature Date/Time: 03/03/2019/12:24:55 PM    Final     Micro Results    No results found for this or any previous visit (from the past 240 hour(s)).     Today   Subjective    Danyelle Brookover today has no Chest pains, no sob, and wants to go home  Patient has been seen and examined prior to discharge   Objective   Blood pressure 125/61, pulse 84, temperature 98.2 F (36.8 C), temperature source Oral, resp. rate (!) 22, height '5\' 2"'  (1.575 m), weight 90 kg, SpO2 93 %.  No intake or output data in the 24 hours ending 03/31/19 1842  Exam Gen:- NAD HEENT:- [place].AT, MMM Neck-Supple Neck,No JVD,  Lungs- CTAB CV- S1, S2 normal Abd-  +ve B.Sounds, Abd Soft, No tenderness,    Extremity/Skin:- Intact peripheral pulses     Data Review   CBC w Diff:  Lab Results  Component Value Date   WBC 5.3 01/31/2019   HGB 12.0 01/31/2019   HGB 14.4 05/27/2017   HCT 36.2 01/31/2019   HCT 42.2 05/27/2017   PLT 202 01/31/2019   PLT 221 05/27/2017   LYMPHOPCT 25.8 11/27/2018   MONOPCT 8.0 11/27/2018   EOSPCT 0.7 11/27/2018   BASOPCT 1.9 11/27/2018    CMP:  Lab Results  Component Value Date   NA 141 02/16/2019   NA 141 01/28/2018   K 4.3 02/16/2019   CL 102 02/16/2019   CO2 31 02/16/2019   BUN 13 02/16/2019   BUN 21 01/28/2018   CREATININE 0.89 02/16/2019   CREATININE 0.82 10/04/2015   GLU 111 12/28/2016   PROT 7.2 02/16/2019   PROT 6.9 08/26/2018   ALBUMIN 4.6  02/16/2019   ALBUMIN 4.7 08/26/2018   BILITOT 1.4 (H) 02/16/2019   BILITOT 1.1 08/26/2018   ALKPHOS 76 02/16/2019   AST 18 02/16/2019   ALT 17 02/16/2019  .   Total Discharge time is about 33 minutes  Benito Mccreedy M.D on 03/31/2019 at [place] PM  [facility]   Office  [phone]  Dragon dictation system was used to create this note, attempts have been made to correct errors, however presence of uncorrected errors is not a reflection quality of care provided

## 2019-04-01 ENCOUNTER — Telehealth: Payer: Self-pay | Admitting: *Deleted

## 2019-04-01 IMAGING — MR MR LUMBAR SPINE W/O CM
5 series · 45 of 48 positions shown · non-contrast
Comparison: Lumbar spine radiograph March 13, 2017

CLINICAL DATA: Low back pain for 3 months, worse with standing and
walking. No injury.

EXAM:
MRI LUMBAR SPINE WITHOUT CONTRAST
TECHNIQUE: Multiplanar, multisequence MR imaging of the lumbar spine was
performed. No intravenous contrast was administered.

[Series 3: T2 post-contrast · sagittal · 4.0mm · 0.88mm/px · 6 of 12 slices shown]
[im 1/12]
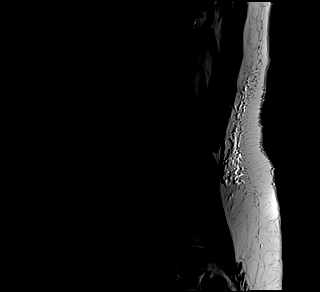
[im 3/12]
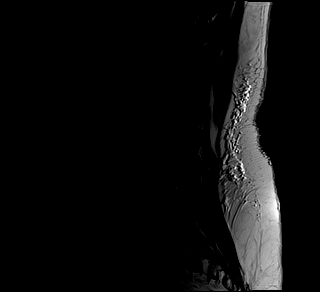
[im 5/12]
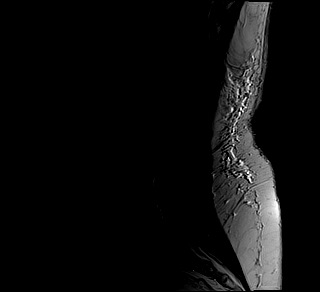
[im 7/12]
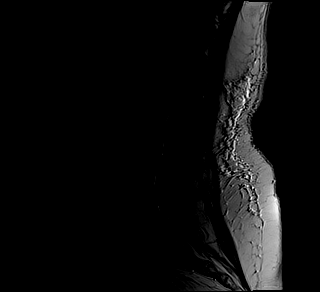
[im 9/12]
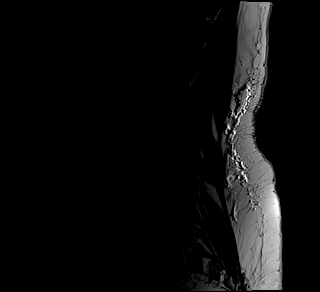
[im 12/12]
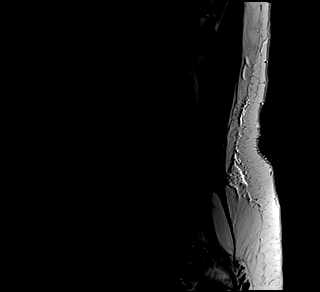

[Series 4: T1 · sagittal · 4.0mm · 0.88mm/px · 6 of 12 slices shown (1 of 2)]
[im 1/12]
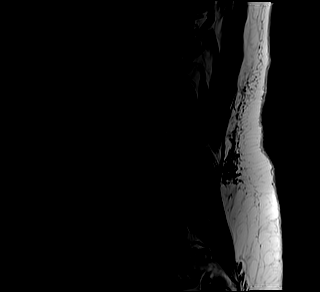
[im 3/12]
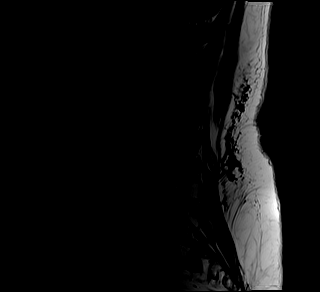
[im 5/12]
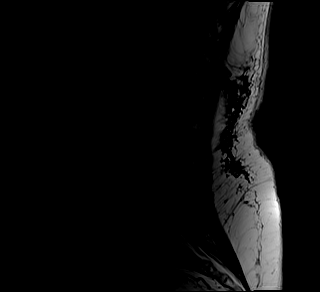
[im 7/12]
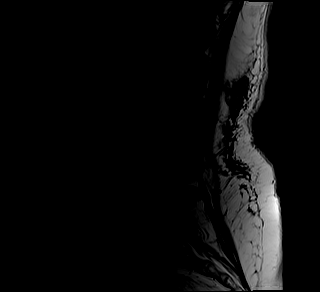
[im 9/12]
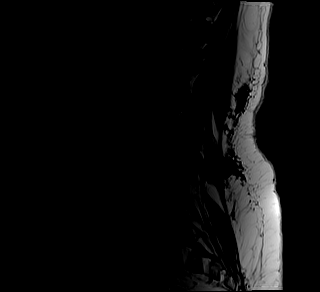
[im 12/12]
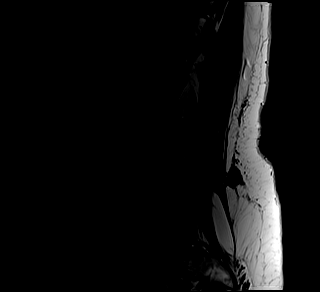

[Series 5: T1 · axial · 4.0mm · 0.74mm/px · z∈[-78,+140]mm · 12 of 32 slices shown (2 of 2)]
[im 1/32]
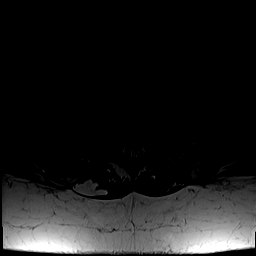
[im 3/32]
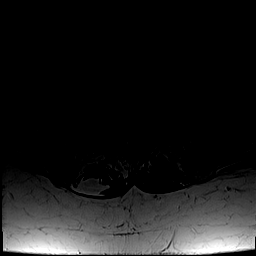
[im 5/32]
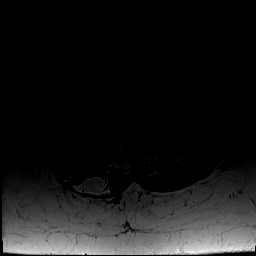
[im 7/32]
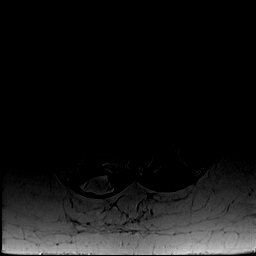
[im 9/32]
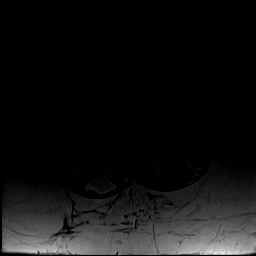
[im 12/32]
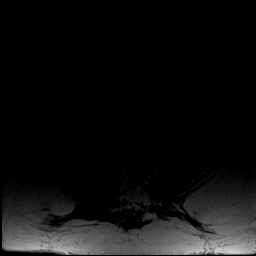
[im 14/32]
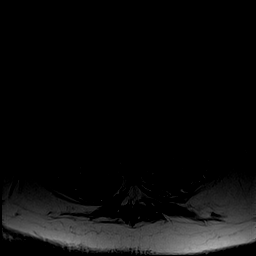
[im 16/32]
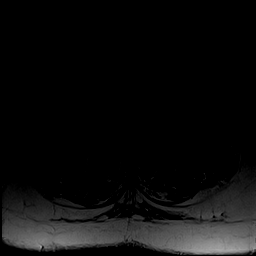
[im 18/32]
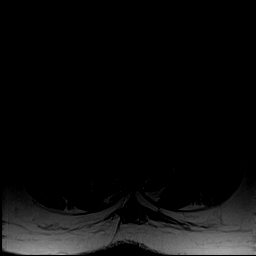
[im 23/32]
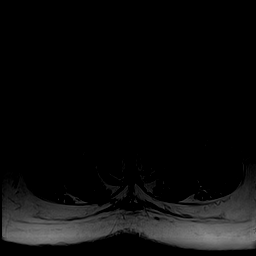
[im 27/32]
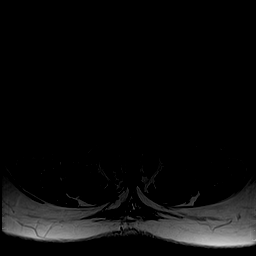
[im 32/32]
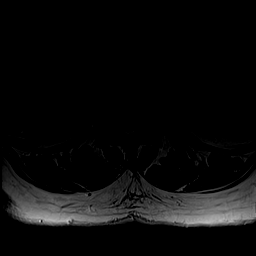

[Series 6: STIR · sagittal · 4.0mm · 0.55mm/px · 6 of 12 slices shown]
[im 1/12]
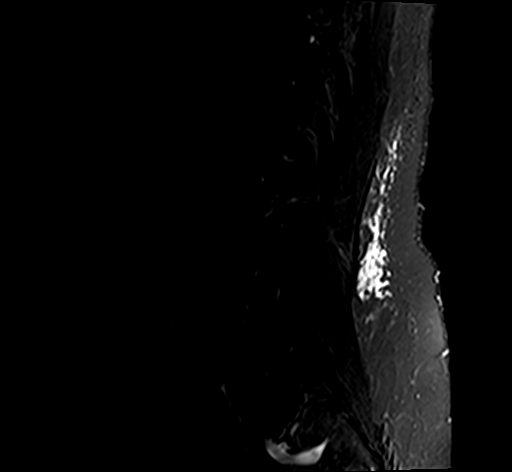
[im 3/12]
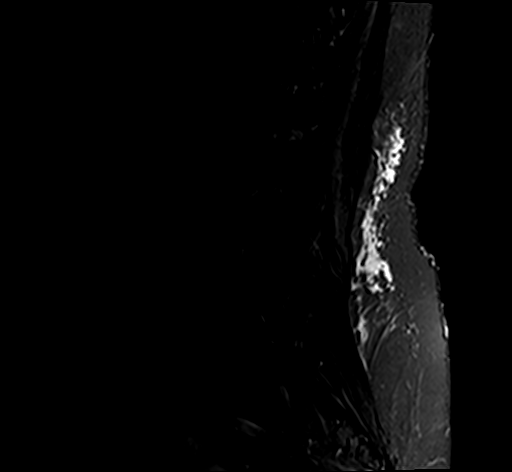
[im 5/12]
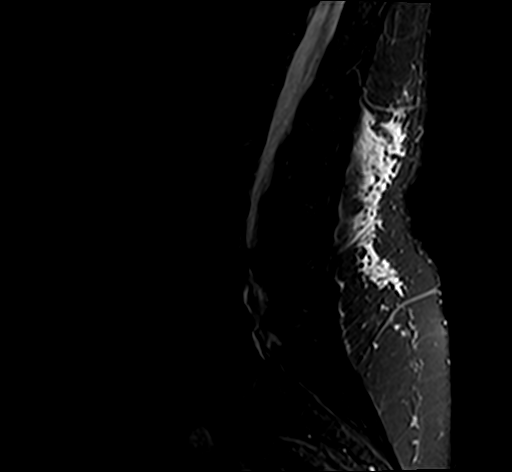
[im 7/12]
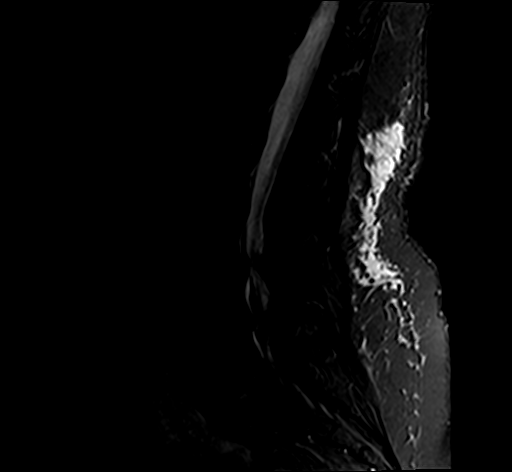
[im 9/12]
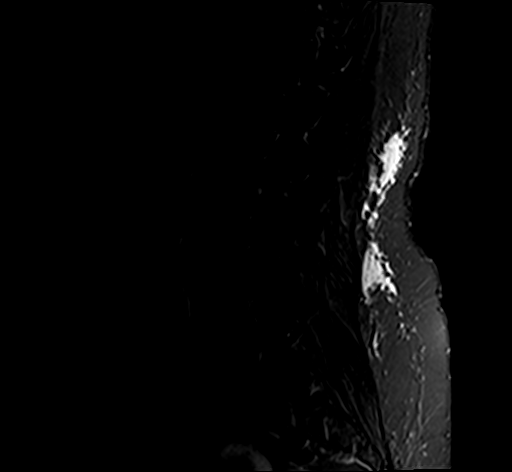
[im 12/12]
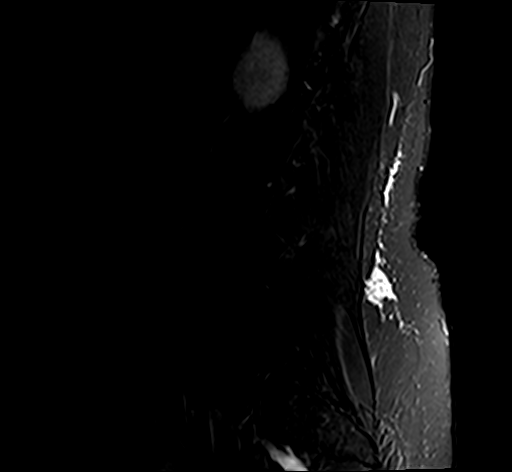

[Series 7: T2 · axial · 4.0mm · 0.74mm/px · z∈[-78,+140]mm · 15 of 32 slices shown]
[im 1/32]
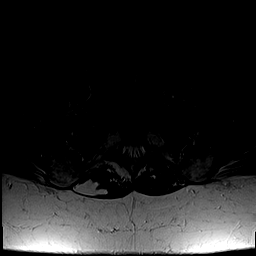
[im 3/32]
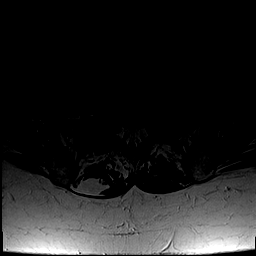
[im 5/32]
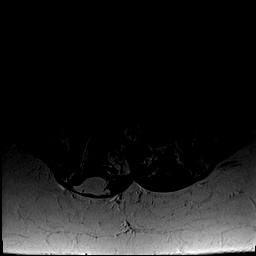
[im 7/32]
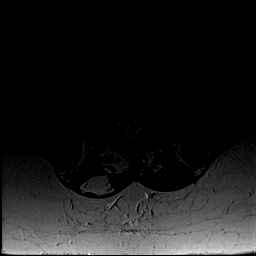
[im 9/32]
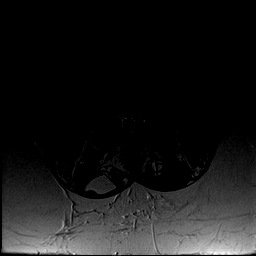
[im 12/32]
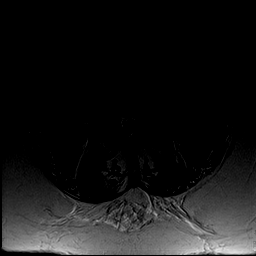
[im 14/32]
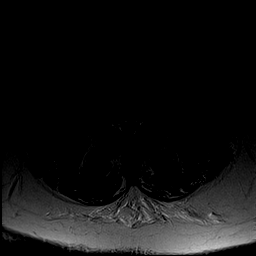
[im 16/32]
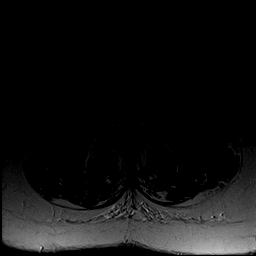
[im 18/32]
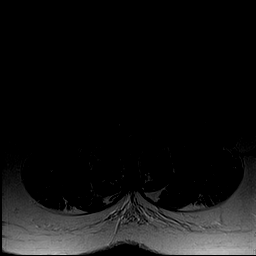
[im 20/32]
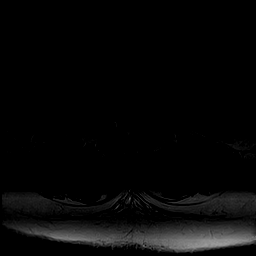
[im 23/32]
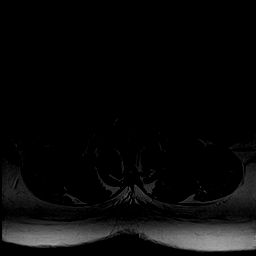
[im 25/32]
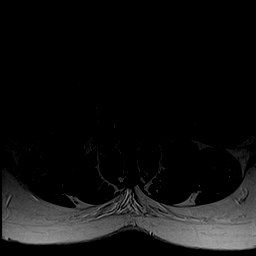
[im 27/32]
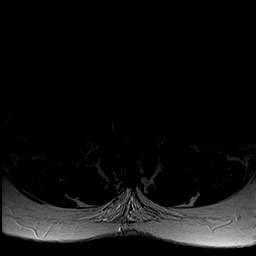
[im 29/32]
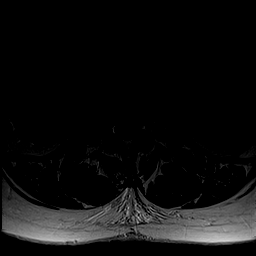
[im 32/32]
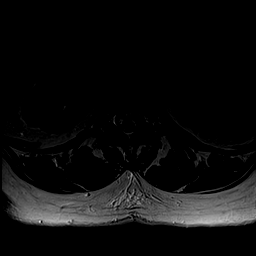

[45 of 48 positions shown; findings below may reference images not displayed]

FINDINGS: SEGMENTATION: For the purposes of this report, the last well-formed
intervertebral disc is reported as L5-S1.

ALIGNMENT: Maintained lumbar lordosis. Grade 1 (4 mm) L3-4
anterolisthesis. No spondylolysis.

VERTEBRAE:Vertebral bodies are intact. Moderate to severe L3-4 disc
height loss. Remaining disc morphology generally preserved with mild
disc desiccation mid lumbar discs. Mild chronic discogenic endplate
changes L2-3. No suspicious or acute bone marrow signal.

CONUS MEDULLARIS AND CAUDA EQUINA: Conus medullaris terminates at
T12-L1 and demonstrates normal morphology and signal
characteristics. Cauda equina is normal.

PARASPINAL AND OTHER SOFT TISSUES: Nonacute. Asymmetrically
prominent T1 and T2 bright fat signal RIGHT paraspinal muscles at
lumbosacral junction favoring small intramuscular lipoma. Dependent
subcutaneous edema.

DISC LEVELS:

T12-L1, L1-2: No disc bulge, canal stenosis nor neural foraminal
narrowing. Mild facet arthropathy.

L2-3: Annular bulging. Moderate facet arthropathy and ligamentum
flavum redundancy without canal stenosis or neural foraminal
narrowing.

L3-4: Anterolisthesis. Moderate to severe facet arthropathy and
ligamentum flavum redundancy. Central annular fissure. Severe canal
stenosis, AP dimension of the thecal sac is 5 mm. Lateral recess
narrowing may affect the traversing L4 nerves. Moderate bilateral
neural foraminal narrowing.

L4-5: 3 mm broad-based disc bulge asymmetric to the RIGHT. RIGHT
annular fissure. Moderate facet arthropathy and ligamentum flavum
redundancy. Mild canal stenosis. Moderate RIGHT, mild-to-moderate
LEFT neural foraminal narrowing.

L5-S1: 2 mm broad-based disc bulge asymmetric to the RIGHT. Moderate
to severe RIGHT, mild LEFT facet arthropathy. No canal stenosis.
Minimal RIGHT neural foraminal narrowing.
IMPRESSION: 1. Grade 1 L3-4 anterolisthesis without spondylolysis. No fracture
or acute osseous process.
2. Severe canal stenosis L3-4.  Mild canal stenosis L4-5.
3. Neural foraminal narrowing L3-4 through L5-S1: Moderate at L3-4
and L4-5.

## 2019-04-01 NOTE — Telephone Encounter (Signed)
Error in what?

## 2019-04-01 NOTE — Telephone Encounter (Signed)
Pt states there has been an error. Reports she was in the hospital in Dec 2020 and got out in Dec 2020 and came to see PCP after. Pt states she has not been in the hospital since.

## 2019-04-02 NOTE — Telephone Encounter (Signed)
The pt reports she was discharged from the hospital on 01/31/19. She saw PCP (Dr.Lowne)  on 02/16/19 for hospital follow up. Pt state she has not been back in the hospital and that there must have been a documentation error. The AVS from 01/31/19 verifies pt statement.

## 2019-04-02 NOTE — Telephone Encounter (Signed)
Sierra Ford Would you help [name] with this--- I'm guessing med records at the hosp needs to be notified

## 2019-04-08 DIAGNOSIS — R0600 Dyspnea, unspecified: Secondary | ICD-10-CM | POA: Diagnosis not present

## 2019-04-08 DIAGNOSIS — I2699 Other pulmonary embolism without acute cor pulmonale: Secondary | ICD-10-CM | POA: Diagnosis not present

## 2019-04-08 DIAGNOSIS — I5032 Chronic diastolic (congestive) heart failure: Secondary | ICD-10-CM | POA: Diagnosis not present

## 2019-04-08 DIAGNOSIS — I2609 Other pulmonary embolism with acute cor pulmonale: Secondary | ICD-10-CM | POA: Diagnosis not present

## 2019-04-08 DIAGNOSIS — J9601 Acute respiratory failure with hypoxia: Secondary | ICD-10-CM | POA: Diagnosis not present

## 2019-04-08 DIAGNOSIS — G4733 Obstructive sleep apnea (adult) (pediatric): Secondary | ICD-10-CM | POA: Diagnosis not present

## 2019-04-14 NOTE — Telephone Encounter (Signed)
Spoke with [place] Glenard Haring) [place], RN, this morning and this has been taken care of.  We do not need to do anything on our part here in the office.

## 2019-04-30 ENCOUNTER — Telehealth: Payer: Self-pay | Admitting: Family Medicine

## 2019-05-04 NOTE — Progress Notes (Signed)
Nurse connected with patient 05/05/19 at  1:45 PM EDT by a telephone enabled telemedicine application and verified that I am speaking with the correct person using two identifiers. Patient stated full name and DOB. Patient gave permission to continue with virtual visit. Patient's location was at home and Nurse's location was at [place] office.   Subjective:   Sierra Ford is a 79 y.o. female who presents for Medicare Annual (Subsequent) preventive examination.  Review of Systems: No ROS.  Medicare Wellness Virtual Visit.  Visual/audio telehealth visit, UTA vital signs.   See social history for additional risk factors.  Home Safety/Smoke Alarms: Feels safe in home. Smoke alarms in place.  Lives alone. Dtr and nephews check on her regular. Has life alert necklace and bracelet. Uses walker as needed.  Female:   Mammo- pt reports she does w/ OBGYN- [facility]       Dexa scan- pt reports she does w/ OBGYN- Dr.Ron Neal          CCS- 07/17/17. No longer doing routine screening due to age.     Objective:     Vitals: Unable to assess. This visit is enabled though telemedicine due to Covid 19.   Advanced Directives 05/05/2019 01/28/2019 10/24/2017 07/17/2017  Does Patient Have a Medical Advance Directive? No Yes Yes No  Type of Advance Directive - Healthcare Power of Attorney Living will -  Does patient want to make changes to medical advance directive? No - Patient declined No - Patient declined No - Patient declined -  Copy of [place] in Chart? - No - copy requested - -    Tobacco Social History   Tobacco Use  Smoking Status Former Smoker  . Packs/day: 1.00  . Years: 50.00  . Pack years: 50.00  . Types: Cigarettes  . Quit date: 11/21/2009  . Years since quitting: 9.4  Smokeless Tobacco Never Used     Counseling given: Not Answered   Clinical Intake: Pain : No/denies pain     Past Medical History:  Diagnosis Date  . Anxiety   . Back pain   . Chest  pain    CLite with apical ischemia in 2006 - normal coronary arteries by [facility] in 12/2004;  Myoview 11/12:  Low risk stress nuclear study with a small, partially reversible apical defect most likely related to apical thinning; cannot R/O very mild apical ischemia.  EF: 75%   . Decreased hearing   . Depression   . Diabetes ([place])   . DVT (deep venous thrombosis) ([place]) 2006   hx of  . Ectopic pregnancy with intrauterine pregnancy   . Hiatal hernia   . Hyperlipidemia   . PE (pulmonary embolism) 2006   hx of  . Sleep apnea    CPAP    Past Surgical History:  Procedure Laterality Date  . ABDOMINAL EXPLORATION SURGERY    . BUNIONECTOMY     right  . COLONOSCOPY  2009  . ECTOPIC PREGNANCY SURGERY    . FOOT SURGERY    . TUBAL LIGATION     Family History  Problem Relation Age of Onset  . Heart failure Mother        died from  . Colon polyps Mother   . Hypertension Mother   . Sudden death Mother   . Obesity Mother   . Alcoholism Father   . Diabetes Maternal Museum/gallery exhibitions officer  . Diabetes Maternal Aunt   . Colon cancer  Neg Hx   . Esophageal cancer Neg Hx   . Stomach cancer Neg Hx    Social History   Socioeconomic History  . Marital status: Divorced    Spouse name: Not on file  . Number of children: Not on file  . Years of education: Not on file  . Highest education level: Not on file  Occupational History  . Occupation: Retired Scientist, research (medical)  Tobacco Use  . Smoking status: Former Smoker    Packs/day: 1.00    Years: 50.00    Pack years: 50.00    Types: Cigarettes    Quit date: 11/21/2009    Years since quitting: 9.4  . Smokeless tobacco: Never Used  Substance and Sexual Activity  . Alcohol use: Yes    Comment: rare 3 times a year per pt  . Drug use: No  . Sexual activity: Not Currently    Partners: Male  Other Topics Concern  . Not on file  Social History Narrative   Regular exercise         Social Determinants of Health   Financial Resource Strain: Low  Risk   . Difficulty of Paying Living Expenses: Not hard at all  Food Insecurity: No Food Insecurity  . Worried About Charity fundraiser in the Last Year: Never true  . Ran Out of Food in the Last Year: Never true  Transportation Needs: No Transportation Needs  . Lack of Transportation (Medical): No  . Lack of Transportation (Non-Medical): No  Physical Activity:   . Days of Exercise per Week:   . Minutes of Exercise per Session:   Stress:   . Feeling of Stress :   Social Connections:   . Frequency of Communication with Friends and Family:   . Frequency of Social Gatherings with Friends and Family:   . Attends Religious Services:   . Active Member of Clubs or Organizations:   . Attends Archivist Meetings:   Marland Kitchen Marital Status:     Outpatient Encounter Medications as of 05/05/2019  Medication Sig  . Alirocumab (PRALUENT) 75 MG/ML SOAJ Inject 1 pen into the skin every 14 (fourteen) days.  Marland Kitchen apixaban (ELIQUIS) 5 MG TABS tablet Take 1 tablet (5 mg total) by mouth 2 (two) times daily.  . blood glucose meter kit and supplies KIT Dispense based on patient and insurance preference. Use up to four times daily as directed. (FOR ICD-9 250.00, 250.01).  . CALCIUM-VITAMIN D PO Take 600 Units by mouth 2 (two) times daily.    . cholecalciferol (VITAMIN D-400) 400 UNITS TABS Take 400 Units by mouth 2 (two) times daily.   . Coenzyme Q10 100 MG TABS Take 100 mg by mouth daily.  . ergocalciferol (VITAMIN D2) 1.25 MG (50000 UT) capsule Vitamin D2 1,250 mcg (50,000 unit) capsule  . Flaxseed, Linseed, (FLAXSEED OIL PO) Take 1,000 mg by mouth daily.   . furosemide (LASIX) 40 MG tablet Take 1 tablet (40 mg total) by mouth daily.  . metFORMIN (GLUCOPHAGE) 500 MG tablet Take 1 tablet (500 mg total) by mouth 2 (two) times daily with a meal.  . Multiple Vitamin (MULTIVITAMIN) capsule Take 1 capsule by mouth daily.  . Omega-3 Fatty Acids (FISH OIL PO) Take 1,200 mg by mouth daily.   Marland Kitchen POTASSIUM  CHLORIDE PO Take 1 tablet by mouth as needed (muscle cramps).   . rosuvastatin (CRESTOR) 10 MG tablet Take 1 tablet (10 mg total) by mouth daily.  . TRUE METRIX BLOOD GLUCOSE TEST  test strip TEST  UP  TO FOUR TIMES DAILY AS DIRECTED  . TRUEPLUS LANCETS 33G MISC TEST  UP TO FOUR TIMES DAILY AS DIRECTED  . Wheat Dextrin (BENEFIBER PO) Take 30 mLs by mouth 2 (two) times daily.  Marland Kitchen Zoster Vaccine Adjuvanted [facility]) injection 0.66m IM x1  Repeat in 2-6 months  . [DISCONTINUED] omega-3 acid ethyl esters (LOVAZA) 1 G capsule Take 1 g by mouth 3 (three) times daily.    . [DISCONTINUED] pravastatin (PRAVACHOL) 20 MG tablet Take 20 mg by mouth daily.     Facility-Administered Encounter Medications as of 05/05/2019  Medication  . 0.9 %  sodium chloride infusion    Activities of Daily Living In your present state of health, do you have any difficulty performing the following activities: 05/05/2019 01/28/2019  Hearing? N N  Vision? N N  Difficulty concentrating or making decisions? N N  Walking or climbing stairs? N Y  Dressing or bathing? N N  Doing errands, shopping? N N  Preparing Food and eating ? N -  Using the Toilet? N -  In the past six months, have you accidently leaked urine? N -  Do you have problems with loss of bowel control? N -  Managing your Medications? N -  Managing your Finances? N -  Housekeeping or managing your Housekeeping? N -  Some recent data might be hidden    Patient Care Team: LCarollee Herter YAlferd Apa DO as PCP - General HMinus Breeding MD as PCP - Cardiology (Cardiology) NMaisie Fus MD as Consulting Physician (Obstetrics and Gynecology) BCollene Gobble MD as Consulting Physician (Pulmonary Disease)    Assessment:   This is a routine wellness examination for [place] Physical assessment deferred to PCP.  Exercise Activities and Dietary recommendations Current Exercise Habits: Home exercise routine, Type of exercise: treadmill, Time (Minutes): 60, Frequency  (Times/Week): 5, Weekly Exercise (Minutes/Week): 300, Intensity: Mild, Exercise limited by: None identified Diet (meal preparation, eat out, water intake, caffeinated beverages, dairy products, fruits and vegetables): well balanced   Goals    . LDL CALC < 100       Fall Risk Fall Risk  05/05/2019 07/15/2015 05/20/2014 04/24/2013  Falls in the past year? 0 No No No  Number falls in past yr: 0 - - -  Injury with Fall? 0 - - -  Follow up Education provided;Falls prevention discussed - - -   Depression Screen PHQ 2/9 Scores 05/05/2019 05/27/2017 10/09/2016 07/15/2015  PHQ - 2 Score 0 2 0 0  PHQ- 9 Score - 15 - -     Cognitive Function Ad8 score reviewed for issues:  Issues making decisions:no  Less interest in hobbies / activities:no  Repeats questions, stories (family complaining):no  Trouble using ordinary gadgets (microwave, computer, phone):no  Forgets the month or year: no  Mismanaging finances: no  Remembering appts:no  Daily problems with thinking and/or memory:no Ad8 score is=0     MMSE - Mini Mental State Exam 10/09/2016 07/15/2015  Orientation to time 5 5  Orientation to Place 5 5  Registration 3 3  Attention/ Calculation 5 5  Recall 3 3  Language- name 2 objects 2 2  Language- repeat 1 1  Language- follow 3 step command 3 3  Language- read & follow direction 1 1  Write a sentence 1 1  Copy design 1 1  Total score 30 30        Immunization History  Administered Date(s) Administered  . Pneumococcal Conjugate-13 04/24/2013  .  Pneumococcal Polysaccharide-23 08/07/2007  . Td 03/03/2003  . Tdap 11/06/2018  . Zoster 08/14/2007, 11/13/2007  . Zoster Recombinat (Shingrix) 11/06/2018    Screening Tests Health Maintenance  Topic Date Due  . MAMMOGRAM  03/25/2019  . INFLUENZA VACCINE  05/20/2019 (Originally 09/20/2018)  . HEMOGLOBIN A1C  08/17/2019  . OPHTHALMOLOGY EXAM  10/21/2019  . FOOT EXAM  11/06/2019  . URINE MICROALBUMIN  11/06/2019  . TETANUS/TDAP   11/05/2028  . DEXA SCAN  Completed  . PNA vac Low Risk Adult  Completed      Plan:    Please schedule your next medicare wellness visit with me in 1 yr.  Continue to eat heart healthy diet (full of fruits, vegetables, whole grains, lean protein, water--limit salt, fat, and sugar intake) and increase physical activity as tolerated.  Continue doing brain stimulating activities (puzzles, reading, adult coloring books, staying active) to keep memory sharp.   Bring a copy of your living will and/or healthcare power of attorney to your next office visit.   I have personally reviewed and noted the following in the patient's chart:   . Medical and social history . Use of alcohol, tobacco or illicit drugs  . Current medications and supplements . Functional ability and status . Nutritional status . Physical activity . Advanced directives . List of other physicians . Hospitalizations, surgeries, and ER visits in previous 12 months . Vitals . Screenings to include cognitive, depression, and falls . Referrals and appointments  In addition, I have reviewed and discussed with patient certain preventive protocols, quality metrics, and best practice recommendations. A written personalized care plan for preventive services as well as general preventive health recommendations were provided to patient.     Shela Nevin, [place]  05/05/2019

## 2019-05-05 ENCOUNTER — Other Ambulatory Visit: Payer: Self-pay

## 2019-05-05 ENCOUNTER — Ambulatory Visit (INDEPENDENT_AMBULATORY_CARE_PROVIDER_SITE_OTHER): Payer: Medicare PPO | Admitting: *Deleted

## 2019-05-05 ENCOUNTER — Encounter: Payer: Self-pay | Admitting: *Deleted

## 2019-05-05 DIAGNOSIS — Z0000 Encounter for general adult medical examination without abnormal findings: Secondary | ICD-10-CM

## 2019-05-05 NOTE — Patient Instructions (Signed)
Please schedule your next medicare wellness visit with me in 1 yr.  Continue to eat heart healthy diet (full of fruits, vegetables, whole grains, lean protein, water--limit salt, fat, and sugar intake) and increase physical activity as tolerated.  Continue doing brain stimulating activities (puzzles, reading, adult coloring books, staying active) to keep memory sharp.   Bring a copy of your living will and/or healthcare power of attorney to your next office visit.   Ms. Sou , Thank you for taking time to come for your Medicare Wellness Visit. I appreciate your ongoing commitment to your health goals. Please review the following plan we discussed and let me know if I can assist you in the future.   These are the goals we discussed: Goals    . LDL CALC < 100       This is a list of the screening recommended for you and due dates:  Health Maintenance  Topic Date Due  . Mammogram  03/25/2019  . Flu Shot  05/20/2019*  . Hemoglobin A1C  08/17/2019  . Eye exam for diabetics  10/21/2019  . Complete foot exam   11/06/2019  . Urine Protein Check  11/06/2019  . Tetanus Vaccine  11/05/2028  . DEXA scan (bone density measurement)  Completed  . Pneumonia vaccines  Completed  *Topic was postponed. The date shown is not the original due date.    Preventive Care 78 Years and Older, Female Preventive care refers to lifestyle choices and visits with your health care provider that can promote health and wellness. This includes:  A yearly physical exam. This is also called an annual well check.  Regular dental and eye exams.  Immunizations.  Screening for certain conditions.  Healthy lifestyle choices, such as diet and exercise. What can I expect for my preventive care visit? Physical exam Your health care provider will check:  Height and weight. These may be used to calculate body mass index (BMI), which is a measurement that tells if you are at a healthy weight.  Heart rate and  blood pressure.  Your skin for abnormal spots. Counseling Your health care provider may ask you questions about:  Alcohol, tobacco, and drug use.  Emotional well-being.  Home and relationship well-being.  Sexual activity.  Eating habits.  History of falls.  Memory and ability to understand (cognition).  Work and work Statistician.  Pregnancy and menstrual history. What immunizations do I need?  Influenza (flu) vaccine  This is recommended every year. Tetanus, diphtheria, and pertussis (Tdap) vaccine  You may need a Td booster every 10 years. Varicella (chickenpox) vaccine  You may need this vaccine if you have not already been vaccinated. Zoster (shingles) vaccine  You may need this after age 72. Pneumococcal conjugate (PCV13) vaccine  One dose is recommended after age 31. Pneumococcal polysaccharide (PPSV23) vaccine  One dose is recommended after age 22. Measles, mumps, and rubella (MMR) vaccine  You may need at least one dose of MMR if you were born in 1957 or later. You may also need a second dose. Meningococcal conjugate (MenACWY) vaccine  You may need this if you have certain conditions. Hepatitis A vaccine  You may need this if you have certain conditions or if you travel or work in places where you may be exposed to hepatitis A. Hepatitis B vaccine  You may need this if you have certain conditions or if you travel or work in places where you may be exposed to hepatitis B. Haemophilus influenzae type b (  Hib) vaccine  You may need this if you have certain conditions. You may receive vaccines as individual doses or as more than one vaccine together in one shot (combination vaccines). Talk with your health care provider about the risks and benefits of combination vaccines. What tests do I need? Blood tests  Lipid and cholesterol levels. These may be checked every 5 years, or more frequently depending on your overall health.  Hepatitis C  test.  Hepatitis B test. Screening  Lung cancer screening. You may have this screening every year starting at age 57 if you have a 30-pack-year history of smoking and currently smoke or have quit within the past 15 years.  Colorectal cancer screening. All adults should have this screening starting at age 87 and continuing until age 70. Your health care provider may recommend screening at age 49 if you are at increased risk. You will have tests every 1-10 years, depending on your results and the type of screening test.  Diabetes screening. This is done by checking your blood sugar (glucose) after you have not eaten for a while (fasting). You may have this done every 1-3 years.  Mammogram. This may be done every 1-2 years. Talk with your health care provider about how often you should have regular mammograms.  BRCA-related cancer screening. This may be done if you have a family history of breast, ovarian, tubal, or peritoneal cancers. Other tests  Sexually transmitted disease (STD) testing.  Bone density scan. This is done to screen for osteoporosis. You may have this done starting at age 24. Follow these instructions at home: Eating and drinking  Eat a diet that includes fresh fruits and vegetables, whole grains, lean protein, and low-fat dairy products. Limit your intake of foods with high amounts of sugar, saturated fats, and salt.  Take vitamin and mineral supplements as recommended by your health care provider.  Do not drink alcohol if your health care provider tells you not to drink.  If you drink alcohol: ? Limit how much you have to 0-1 drink a day. ? Be aware of how much alcohol is in your drink. In the [place] one drink equals one 12 oz bottle of beer (355 mL), one 5 oz glass of wine (148 mL), or one 1 oz glass of hard liquor (44 mL). Lifestyle  Take daily care of your teeth and gums.  Stay active. Exercise for at least 30 minutes on 5 or more days each week.  Do not use  any products that contain nicotine or tobacco, such as cigarettes, e-cigarettes, and chewing tobacco. If you need help quitting, ask your health care provider.  If you are sexually active, practice safe sex. Use a condom or other form of protection in order to prevent STIs (sexually transmitted infections).  Talk with your health care provider about taking a low-dose aspirin or statin. What's next?  Go to your health care provider once a year for a well check visit.  Ask your health care provider how often you should have your eyes and teeth checked.  Stay up to date on all vaccines. This information is not intended to replace advice given to you by your health care provider. Make sure you discuss any questions you have with your health care provider. Document Revised: 01/30/2018 Document Reviewed: 01/30/2018 Elsevier Patient Education  2020 [employer].

## 2019-05-06 ENCOUNTER — Other Ambulatory Visit: Payer: Self-pay | Admitting: Cardiology

## 2019-05-06 ENCOUNTER — Other Ambulatory Visit: Payer: Self-pay

## 2019-05-06 DIAGNOSIS — R600 Localized edema: Secondary | ICD-10-CM

## 2019-05-06 DIAGNOSIS — I2699 Other pulmonary embolism without acute cor pulmonale: Secondary | ICD-10-CM | POA: Diagnosis not present

## 2019-05-06 DIAGNOSIS — J9601 Acute respiratory failure with hypoxia: Secondary | ICD-10-CM | POA: Diagnosis not present

## 2019-05-06 DIAGNOSIS — I10 Essential (primary) hypertension: Secondary | ICD-10-CM

## 2019-05-06 DIAGNOSIS — G4733 Obstructive sleep apnea (adult) (pediatric): Secondary | ICD-10-CM | POA: Diagnosis not present

## 2019-05-06 DIAGNOSIS — I2609 Other pulmonary embolism with acute cor pulmonale: Secondary | ICD-10-CM | POA: Diagnosis not present

## 2019-05-06 DIAGNOSIS — I5032 Chronic diastolic (congestive) heart failure: Secondary | ICD-10-CM | POA: Diagnosis not present

## 2019-05-06 DIAGNOSIS — R0600 Dyspnea, unspecified: Secondary | ICD-10-CM | POA: Diagnosis not present

## 2019-05-06 MED ORDER — ROSUVASTATIN CALCIUM 10 MG PO TABS: 10.0000 mg | ORAL_TABLET | Freq: Every day | ORAL | 3 refills | Status: DC

## 2019-05-06 MED ORDER — FUROSEMIDE 40 MG PO TABS: 40.0000 mg | ORAL_TABLET | Freq: Every day | ORAL | 2 refills | Status: DC

## 2019-05-06 NOTE — Telephone Encounter (Signed)
Refill sent as requested to [facility]

## 2019-05-06 NOTE — Telephone Encounter (Signed)
*  STAT* If patient is at the pharmacy, call can be transferred to refill team.   1. Which medications need to be refilled? (please list name of each medication and dose if known)  furosemide (LASIX) 40 MG tablet metFORMIN (GLUCOPHAGE) 500 MG tablet  2. Which pharmacy/location (including street and city if local pharmacy) is medication to be sent to?  [id number] [place] [place] - [address]  3. Do they need a 30 day or 90 day supply? 90   Separate encounter started because pt requested refills from two different pharmacies

## 2019-05-06 NOTE — Telephone Encounter (Signed)
*  STAT* If patient is at the pharmacy, call can be transferred to refill team.   1. Which medications need to be refilled? (please list name of each medication and dose if known)  rosuvastatin (CRESTOR) 10 MG tablet  2. Which pharmacy/location (including street and city if local pharmacy) is medication to be sent to? [facility]. See note below  3. Do they need a 30 day or 90 day supply? 90   PT was just told to fax any new Rx to [phone]. The patient has [facility]. The insurance company just said to fax all refills to this number.[phone]. The Pharmacy Phone number on her insurance card is [phone]  The patient said she will call and try to get an updated name of the mail order pharmacy then call us back with an update

## 2019-05-06 NOTE — Telephone Encounter (Signed)
Patient states she is calling to provide mail order pharmacy name. The updated name of the pharmacy is [facility] [place] [address].

## 2019-05-06 NOTE — Telephone Encounter (Signed)
Furosemide filled as requested via epic PCP should fill Metformin .Adonis Housekeeper

## 2019-05-07 ENCOUNTER — Ambulatory Visit (INDEPENDENT_AMBULATORY_CARE_PROVIDER_SITE_OTHER): Payer: Medicare PPO | Admitting: Family Medicine

## 2019-05-07 ENCOUNTER — Encounter: Payer: Self-pay | Admitting: Family Medicine

## 2019-05-07 ENCOUNTER — Other Ambulatory Visit: Payer: Self-pay

## 2019-05-07 VITALS — BP 110/68 | HR 68 | Temp 97.9°F | Resp 18 | Ht 62.0 in | Wt 186.8 lb

## 2019-05-07 DIAGNOSIS — M545 Low back pain: Secondary | ICD-10-CM | POA: Diagnosis not present

## 2019-05-07 DIAGNOSIS — E1165 Type 2 diabetes mellitus with hyperglycemia: Secondary | ICD-10-CM

## 2019-05-07 DIAGNOSIS — I2699 Other pulmonary embolism without acute cor pulmonale: Secondary | ICD-10-CM | POA: Diagnosis not present

## 2019-05-07 DIAGNOSIS — I10 Essential (primary) hypertension: Secondary | ICD-10-CM

## 2019-05-07 DIAGNOSIS — E785 Hyperlipidemia, unspecified: Secondary | ICD-10-CM | POA: Diagnosis not present

## 2019-05-07 DIAGNOSIS — Z7189 Other specified counseling: Secondary | ICD-10-CM

## 2019-05-07 DIAGNOSIS — E1169 Type 2 diabetes mellitus with other specified complication: Secondary | ICD-10-CM

## 2019-05-07 DIAGNOSIS — IMO0002 Reserved for concepts with insufficient information to code with codable children: Secondary | ICD-10-CM

## 2019-05-07 DIAGNOSIS — R0600 Dyspnea, unspecified: Secondary | ICD-10-CM | POA: Diagnosis not present

## 2019-05-07 DIAGNOSIS — G4733 Obstructive sleep apnea (adult) (pediatric): Secondary | ICD-10-CM

## 2019-05-07 DIAGNOSIS — Z23 Encounter for immunization: Secondary | ICD-10-CM

## 2019-05-07 DIAGNOSIS — R0609 Other forms of dyspnea: Secondary | ICD-10-CM

## 2019-05-07 DIAGNOSIS — E1151 Type 2 diabetes mellitus with diabetic peripheral angiopathy without gangrene: Secondary | ICD-10-CM

## 2019-05-07 LAB — LIPID PANEL
Cholesterol: 158 mg/dL (ref 0–200)
HDL: 95.3 mg/dL (ref >39.00)
LDL Cholesterol: 47 mg/dL (ref 0–99)
NonHDL: 63.15
Total CHOL/HDL Ratio: 2
Triglycerides: 81 mg/dL (ref 0.0–149.0)
VLDL: 16.2 mg/dL (ref 0.0–40.0)

## 2019-05-07 LAB — COMPREHENSIVE METABOLIC PANEL
ALT: 15 U/L (ref 0–35)
AST: 17 U/L (ref 0–37)
Albumin: 4.1 g/dL (ref 3.5–5.2)
Alkaline Phosphatase: 76 U/L (ref 39–117)
BUN: 15 mg/dL (ref 6–23)
CO2: 31 mEq/L (ref 19–32)
Calcium: 9.9 mg/dL (ref 8.4–10.5)
Chloride: 102 mEq/L (ref 96–112)
Creatinine, Ser: 0.84 mg/dL (ref 0.40–1.20)
GFR: 79.42 mL/min (ref >60.00)
Glucose, Bld: 120 mg/dL — ABNORMAL HIGH (ref 70–99)
Potassium: 4.3 mEq/L (ref 3.5–5.1)
Sodium: 140 mEq/L (ref 135–145)
Total Bilirubin: 0.7 mg/dL (ref 0.2–1.2)
Total Protein: 6.7 g/dL (ref 6.0–8.3)

## 2019-05-07 LAB — MICROALBUMIN / CREATININE URINE RATIO
Creatinine,U: 225.2 mg/dL
Microalb Creat Ratio: 1 mg/g (ref 0.0–30.0)
Microalb, Ur: 2.3 mg/dL — ABNORMAL HIGH (ref 0.0–1.9)

## 2019-05-07 LAB — HEMOGLOBIN A1C: Hgb A1c MFr Bld: 6.4 % (ref 4.6–6.5)

## 2019-05-07 NOTE — Assessment & Plan Note (Signed)
hgba1c to be checked, minimize simple carbs. Increase exercise as tolerated. Continue current meds  

## 2019-05-07 NOTE — Assessment & Plan Note (Signed)
Tolerating statin, encouraged heart healthy diet, avoid trans fats, minimize simple carbs and saturated fats. Increase exercise as tolerated 

## 2019-05-07 NOTE — Progress Notes (Signed)
Patient ID: Sierra Ford, female    DOB: 1940/09/09  Age: 79 y.o. MRN: [id number]    Subjective:  Subjective  HPI Sierra Ford presents for dm, chol and bp.  She is getting sob with exertion --- she has O2 at 2L at home   HYPERTENSION   Blood pressure range-not checking   Chest pain- no      Dyspnea- no Lightheadedness- no   Edema- no  Other side effects - no   Medication compliance: good Low salt diet- no    DIABETES    Blood Sugar ranges-109 yesterday  Polyuria- no New Visual problems- no  Hypoglycemic symptoms- no  Other side effects-no Medication compliance - good Last eye exam- 7/20 Foot exam- today    HYPERLIPIDEMIA  Medication compliance- good  RUQ pain- no  Muscle aches- no Other side effects-no       Review of Systems  Constitutional: Negative for appetite change, diaphoresis, fatigue and unexpected weight change.  Eyes: Negative for pain, redness and visual disturbance.  Respiratory: Positive for shortness of breath. Negative for cough, chest tightness and wheezing.   Cardiovascular: Negative for chest pain, palpitations and leg swelling.  Endocrine: Negative for cold intolerance, heat intolerance, polydipsia, polyphagia and polyuria.  Genitourinary: Negative for difficulty urinating, dysuria and frequency.  Neurological: Negative for dizziness, light-headedness, numbness and headaches.    History Past Medical History:  Diagnosis Date  . Anxiety   . Back pain   . Chest pain    CLite with apical ischemia in 2006 - normal coronary arteries by [facility] in 12/2004;  Myoview 11/12:  Low risk stress nuclear study with a small, partially reversible apical defect most likely related to apical thinning; cannot R/O very mild apical ischemia.  EF: 75%   . Decreased hearing   . Depression   . Diabetes ([place])   . DVT (deep venous thrombosis) ([place]) 2006   hx of  . Ectopic pregnancy with intrauterine pregnancy   . Hiatal hernia   . Hyperlipidemia   . PE  (pulmonary embolism) 2006   hx of  . Sleep apnea    CPAP     She has a past surgical history that includes Foot surgery; Abdominal exploration surgery; Ectopic pregnancy surgery; Bunionectomy; Tubal ligation; and Colonoscopy (2009).   Her family history includes Alcoholism in her father; Colon polyps in her mother; Diabetes in her maternal aunt and maternal aunt; Heart failure in her mother; Hypertension in her mother; Obesity in her mother; Sudden death in her mother.She reports that she quit smoking about 9 years ago. Her smoking use included cigarettes. She has a 50.00 pack-year smoking history. She has never used smokeless tobacco. She reports current alcohol use. She reports that she does not use drugs.  Current Outpatient Medications on File Prior to Visit  Medication Sig Dispense Refill  . Alirocumab (PRALUENT) 75 MG/ML SOAJ Inject 1 pen into the skin every 14 (fourteen) days. 2 pen 11  . apixaban (ELIQUIS) 5 MG TABS tablet Take 1 tablet (5 mg total) by mouth 2 (two) times daily. 180 tablet 1  . blood glucose meter kit and supplies KIT Dispense based on patient and insurance preference. Use up to four times daily as directed. (FOR ICD-9 250.00, 250.01). 1 each 0  . CALCIUM-VITAMIN D PO Take 600 Units by mouth 2 (two) times daily.      . cholecalciferol (VITAMIN D-400) 400 UNITS TABS Take 400 Units by mouth 2 (two) times daily.     Marland Kitchen  Coenzyme Q10 100 MG TABS Take 100 mg by mouth daily.    . ergocalciferol (VITAMIN D2) 1.25 MG (50000 UT) capsule Vitamin D2 1,250 mcg (50,000 unit) capsule    . Flaxseed, Linseed, (FLAXSEED OIL PO) Take 1,000 mg by mouth daily.     . furosemide (LASIX) 40 MG tablet Take 1 tablet (40 mg total) by mouth daily. 90 tablet 2  . metFORMIN (GLUCOPHAGE) 500 MG tablet Take 1 tablet (500 mg total) by mouth 2 (two) times daily with a meal. 180 tablet 2  . Multiple Vitamin (MULTIVITAMIN) capsule Take 1 capsule by mouth daily.    . Omega-3 Fatty Acids (FISH OIL PO) Take  1,200 mg by mouth daily.     Marland Kitchen POTASSIUM CHLORIDE PO Take 1 tablet by mouth as needed (muscle cramps).     . rosuvastatin (CRESTOR) 10 MG tablet Take 1 tablet (10 mg total) by mouth daily. 90 tablet 3  . TRUE METRIX BLOOD GLUCOSE TEST test strip TEST  UP  TO FOUR TIMES DAILY AS DIRECTED 200 each 12  . TRUEPLUS LANCETS 33G MISC TEST  UP TO FOUR TIMES DAILY AS DIRECTED 200 each 12  . Wheat Dextrin (BENEFIBER PO) Take 30 mLs by mouth 2 (two) times daily.    Marland Kitchen Zoster Vaccine Adjuvanted [facility]) injection 0.56m IM x1  Repeat in 2-6 months 0.5 mL 1  . [DISCONTINUED] omega-3 acid ethyl esters (LOVAZA) 1 G capsule Take 1 g by mouth 3 (three) times daily.      . [DISCONTINUED] pravastatin (PRAVACHOL) 20 MG tablet Take 20 mg by mouth daily.       Current Facility-Administered Medications on File Prior to Visit  Medication Dose Route Frequency Provider Last Rate Last Admin  . 0.9 %  sodium chloride infusion  500 mL Intravenous Once Nandigam, KVenia Minks MD         Objective:  Objective  Physical Exam Vitals and nursing note reviewed.  Constitutional:      Appearance: She is well-developed.  HENT:     Head: Normocephalic and atraumatic.  Eyes:     Conjunctiva/sclera: Conjunctivae normal.  Neck:     Thyroid: No thyromegaly.     Vascular: No carotid bruit or JVD.  Cardiovascular:     Rate and Rhythm: Normal rate and regular rhythm.     Heart sounds: Normal heart sounds. No murmur.  Pulmonary:     Effort: Pulmonary effort is normal. No respiratory distress.     Breath sounds: Normal breath sounds. No wheezing or rales.  Chest:     Chest wall: No tenderness.  Musculoskeletal:     Cervical back: Normal range of motion and neck supple.  Neurological:     Mental Status: She is alert and oriented to person, place, and time.    Diabetic Foot Exam - Simple   Simple Foot Form Diabetic Foot exam was performed with the following findings: Yes 05/07/2019 10:15 AM  Visual Inspection No  deformities, no ulcerations, no other skin breakdown bilaterally: Yes Sensation Testing Intact to touch and monofilament testing bilaterally: Yes Pulse Check Posterior Tibialis and Dorsalis pulse intact bilaterally: Yes Comments     BP 110/68 (BP Location: Right Arm, Patient Position: Sitting, Cuff Size: Large)   Pulse 68   Temp 97.9 F (36.6 C) (Temporal)   Resp 18   Ht 5' 2" (1.575 m)   Wt 186 lb 12.8 oz (84.7 kg)   SpO2 98%   BMI 34.17 kg/m  Wt Readings from Last  3 Encounters:  05/07/19 186 lb 12.8 oz (84.7 kg)  03/05/19 190 lb (86.2 kg)  02/16/19 191 lb (86.6 kg)     Lab Results  Component Value Date   WBC 5.3 01/31/2019   HGB 12.0 01/31/2019   HCT 36.2 01/31/2019   PLT 202 01/31/2019   GLUCOSE 115 (H) 02/16/2019   CHOL 157 02/16/2019   TRIG 86.0 02/16/2019   HDL 84.60 02/16/2019   LDLDIRECT 95.9 10/06/2012   LDLCALC 55 02/16/2019   ALT 17 02/16/2019   AST 18 02/16/2019   NA 141 02/16/2019   K 4.3 02/16/2019   CL 102 02/16/2019   CREATININE 0.89 02/16/2019   BUN 13 02/16/2019   CO2 31 02/16/2019   TSH 1.250 05/27/2017   INR 2.0 (H) 01/29/2019   HGBA1C 6.7 (H) 02/16/2019   MICROALBUR 2.1 (H) 11/06/2018    DG Chest 2 View  Result Date: 01/27/2019 CLINICAL DATA:  Acute shortness of breath EXAM: CHEST - 2 VIEW COMPARISON:  01/29/2018 FINDINGS: The cardiomediastinal silhouette is unremarkable. Minimal subsegmental atelectasis/scarring within the UPPER RIGHT lung and LOWER LEFT lung noted. There is no evidence of focal airspace disease, pulmonary edema, suspicious pulmonary nodule/mass, pleural effusion, or pneumothorax. No acute bony abnormalities are identified. IMPRESSION: No evidence of acute cardiopulmonary disease. Electronically Signed   By: Margarette Canada M.D.   On: 01/27/2019 12:34   CT Angio Chest PE W/Cm &/Or Wo Cm  Result Date: 01/27/2019 CLINICAL DATA:  Shortness of breath EXAM: CT ANGIOGRAPHY CHEST WITH CONTRAST TECHNIQUE: Multidetector CT imaging of  the chest was performed using the standard protocol during bolus administration of intravenous contrast. Multiplanar CT image reconstructions and MIPs were obtained to evaluate the vascular anatomy. CONTRAST:  178m OMNIPAQUE IOHEXOL 350 MG/ML SOLN COMPARISON:  None. FINDINGS: Cardiovascular: Satisfactory opacification of the pulmonary arteries. Filling defects reflecting acute pulmonary emboli are present within the pulmonary arteries beginning at the bifurcation of the main pulmonary artery with additional lobar, segmental, and subsegmental involvement bilaterally. There is enlargement of the right ventricle relative to the left with straightening of the interventricular septum. Mediastinum/Nodes: No adenopathy. Lungs/Pleura: Central airways are patent. Patchy ground-glass density likely related to altered perfusion and atelectasis. No pleural effusion or pneumothorax. Upper Abdomen: Small hiatal hernia. Musculoskeletal: Mild degenerative changes of the included spine. Review of the MIP images confirms the above findings. IMPRESSION: Positive for acute PE with CT evidence of right heart strain (RV/LV Ratio = 1.7) consistent with at least submassive (intermediate risk) PE. The presence of right heart strain has been associated with an increased risk of morbidity and mortality. Please activate Code PE by paging [phone] These results were called by telephone at the time of interpretation on 01/27/2019 at 5:01 pm to provider [facility], who verbally acknowledged these results. Electronically Signed   By: PMacy MisM.D.   On: 01/27/2019 17:08   ECHOCARDIOGRAM COMPLETE  Result Date: 01/28/2019   ECHOCARDIOGRAM REPORT   Patient Name:   Sierra BURFORDDate of Exam: 01/28/2019 Medical Rec #:  [id number]       Height:       62.0 in Accession #:    [id number]      Weight:       193.0 lb Date of Birth:  7January 10, 1942       BSA:          1.88 m Patient Age:    89years         BP:  111/58 mmHg  Patient Gender: F                HR:           89 bpm. Exam Location:  Inpatient Procedure: 2D Echo, Cardiac Doppler, Color Doppler and Intracardiac            Opacification Agent Indications:    Pulmonary embolus 415.19  History:        Patient has prior history of Echocardiogram examinations, most                 recent 04/16/2017. CHF, CAD, Signs/Symptoms:DOE; Risk                 Factors:Hypertension, Diabetes, Dyslipidemia and Former Smoker.                 Pulomary embolus.  Sonographer:    Paulita Fujita RDCS Referring Phys: [place]  1. Severely dilated RV with severely reduced function. Septum is flattened in systole/diastole consistent with RV pressure overload. RVSP is moderately elevated on this study. Findings consistent with submassive PE on review of medical record and CTA chest. Findings communicated to [name], MD @ 5:25 PM.  2. Left ventricular ejection fraction, by visual estimation, is 55 to 60%. The left ventricle has normal function. There is no left ventricular hypertrophy.  3. Definity contrast agent was given IV to delineate the left ventricular endocardial borders.  4. Right ventricular volume and pressure overload.  5. The left ventricle demonstrates regional wall motion abnormalities.  6. Global right ventricle has severely reduced systolic function.The right ventricular size is severely enlarged. No increase in right ventricular wall thickness.  7. Left atrial size was normal.  8. Right atrial size was mildly dilated.  9. Presence of pericardial fat pad. 10. The pericardial effusion is circumferential. 11. Trivial pericardial effusion is present. 12. Mild mitral annular calcification. 13. The mitral valve is degenerative. No evidence of mitral valve regurgitation. 14. The tricuspid valve is grossly normal. Tricuspid valve regurgitation is mild. 15. The aortic valve is tricuspid. Aortic valve regurgitation is not visualized. No evidence of aortic valve  sclerosis or stenosis. 16. Pulmonic regurgitation is mild. 17. The pulmonic valve was grossly normal. Pulmonic valve regurgitation is mild. 18. Moderately elevated pulmonary artery systolic pressure. 19. The tricuspid regurgitant velocity is 3.55 m/s, and with an assumed right atrial pressure of 8 mmHg, the estimated right ventricular systolic pressure is moderately elevated at 58.4 mmHg. 20. The inferior vena cava is normal in size with <50% respiratory variability, suggesting right atrial pressure of 8 mmHg. 21. Changes from prior study are noted. 22. A prior study was performed on 04/16/2017. 23. RV severely dilated and with reduced function compared with prior. FINDINGS  Left Ventricle: Left ventricular ejection fraction, by visual estimation, is 55 to 60%. The left ventricle has normal function. Definity contrast agent was given IV to delineate the left ventricular endocardial borders. The left ventricle demonstrates regional wall motion abnormalities. The left ventricular internal cavity size was the left ventricle is normal in size. There is no left ventricular hypertrophy. The interventricular septum is flattened in systole and diastole, consistent with right ventricular pressure and volume overload. The left ventricular diastology could not be evaluated due to abnormal septal motion. Right Ventricle: The right ventricular size is severely enlarged. No increase in right ventricular wall thickness. Global RV systolic function is has severely reduced systolic function. The tricuspid regurgitant velocity is 3.55 m/s, and  with an assumed right atrial pressure of 8 mmHg, the estimated right ventricular systolic pressure is moderately elevated at 58.4 mmHg. Left Atrium: Left atrial size was normal in size. Right Atrium: Right atrial size was mildly dilated Pericardium: Trivial pericardial effusion is present. The pericardial effusion is circumferential. Presence of pericardial fat pad. Mitral Valve: The mitral  valve is degenerative in appearance. Mild mitral annular calcification. No evidence of mitral valve regurgitation. Tricuspid Valve: The tricuspid valve is grossly normal. Tricuspid valve regurgitation is mild. Aortic Valve: The aortic valve is tricuspid. Aortic valve regurgitation is not visualized. The aortic valve is structurally normal, with no evidence of sclerosis or stenosis. Pulmonic Valve: The pulmonic valve was grossly normal. Pulmonic valve regurgitation is mild. Pulmonic regurgitation is mild. Aorta: The aortic root is normal in size and structure. Venous: The inferior vena cava is normal in size with less than 50% respiratory variability, suggesting right atrial pressure of 8 mmHg. IAS/Shunts: No atrial level shunt detected by color flow Doppler. Additional Comments: A prior study was performed on 04/16/2017.  LEFT VENTRICLE PLAX 2D LVIDd:         3.85 cm       Diastology LVIDs:         2.56 cm       LV e' lateral:   4.35 cm/s LV PW:         0.69 cm       LV E/e' lateral: 8.3 LV IVS:        0.67 cm       LV e' medial:    3.59 cm/s LVOT diam:     1.80 cm       LV E/e' medial:  10.0 LV SV:         40 ml LV SV Index:   20.14 LVOT Area:     2.54 cm  LV Volumes (MOD) LV area d, A4C:    20.60 cm LV area s, A4C:    15.00 cm LV major d, A4C:   6.93 cm LV major s, A4C:   6.15 cm LV vol d, MOD A4C: 51.3 ml LV vol s, MOD A4C: 30.9 ml LV SV MOD A4C:     51.3 ml RIGHT VENTRICLE RV S prime:     7.72 cm/s TAPSE (M-mode): 1.2 cm LEFT ATRIUM             Index       RIGHT ATRIUM          Index LA diam:        3.20 cm 1.70 cm/m  RA Area:     8.89 cm LA Vol (A2C):   24.6 ml 13.06 ml/m RA Volume:   17.20 ml 9.13 ml/m LA Vol (A4C):   18.4 ml 9.77 ml/m LA Biplane Vol: 21.9 ml 11.63 ml/m  AORTIC VALVE LVOT Vmax:   76.90 cm/s LVOT Vmean:  53.800 cm/s LVOT VTI:    0.123 m  AORTA Ao Root diam: 2.50 cm MITRAL VALVE                        TRICUSPID VALVE MV Area (PHT): 3.02 cm             TR Peak grad:   50.4 mmHg MV PHT:         72.79 msec           TR Vmax:        355.00 cm/s MV Decel Time:  251 msec MV E velocity: 35.90 cm/s 103 cm/s  SHUNTS MV A velocity: 60.60 cm/s 70.3 cm/s Systemic VTI:  0.12 m MV E/A ratio:  0.59       1.5       Systemic Diam: 1.80 cm  Eleonore Chiquito MD Electronically signed by Eleonore Chiquito MD Signature Date/Time: 01/28/2019/5:27:44 PM    Final    VAS [name] LOWER EXTREMITY VENOUS (DVT)  Result Date: 01/28/2019  Lower Venous Study Indications: Pulmonary embolism.  Limitations: Poor ultrasound/tissue interface, body habitus and patient positioning. Performing Technologist: Oliver Hum RVT  Examination Guidelines: A complete evaluation includes B-mode imaging, spectral Doppler, color Doppler, and power Doppler as needed of all accessible portions of each vessel. Bilateral testing is considered an integral part of a complete examination. Limited examinations for reoccurring indications may be performed as noted.  +---------+---------------+---------+-----------+----------+--------------+ RIGHT    CompressibilityPhasicitySpontaneityPropertiesThrombus Aging +---------+---------------+---------+-----------+----------+--------------+ CFV      Full           Yes      Yes                                 +---------+---------------+---------+-----------+----------+--------------+ SFJ      Full                                                        +---------+---------------+---------+-----------+----------+--------------+ FV Prox  Partial        No       No                   Acute          +---------+---------------+---------+-----------+----------+--------------+ FV Mid   Partial        No       No                   Acute          +---------+---------------+---------+-----------+----------+--------------+ FV DistalPartial        No       No                   Acute          +---------+---------------+---------+-----------+----------+--------------+ PFV      Full                                                         +---------+---------------+---------+-----------+----------+--------------+ POP      None           No       No                   Acute          +---------+---------------+---------+-----------+----------+--------------+ PTV      Partial                                      Acute          +---------+---------------+---------+-----------+----------+--------------+ PERO     None  Acute          +---------+---------------+---------+-----------+----------+--------------+ Gastroc  None                                         Acute          +---------+---------------+---------+-----------+----------+--------------+   +---------+---------------+---------+-----------+----------+--------------+ LEFT     CompressibilityPhasicitySpontaneityPropertiesThrombus Aging +---------+---------------+---------+-----------+----------+--------------+ CFV      Full           Yes      Yes                                 +---------+---------------+---------+-----------+----------+--------------+ SFJ      Full                                                        +---------+---------------+---------+-----------+----------+--------------+ FV Prox  Full                                                        +---------+---------------+---------+-----------+----------+--------------+ FV Mid   Full                                                        +---------+---------------+---------+-----------+----------+--------------+ FV DistalFull                                                        +---------+---------------+---------+-----------+----------+--------------+ PFV      Full                                                        +---------+---------------+---------+-----------+----------+--------------+ POP      Full           Yes      Yes                                  +---------+---------------+---------+-----------+----------+--------------+ PTV      Full                                                        +---------+---------------+---------+-----------+----------+--------------+ PERO     Full                                                        +---------+---------------+---------+-----------+----------+--------------+  Summary: Right: Findings consistent with acute deep vein thrombosis involving the right femoral vein, right popliteal vein, right posterior tibial veins, right peroneal veins, and right gastrocnemius veins. No cystic structure found in the popliteal fossa. Left: There is no evidence of deep vein thrombosis in the lower extremity. However, portions of this examination were limited- see technologist comments above. No cystic structure found in the popliteal fossa.  *See table(s) above for measurements and observations. Electronically signed by Curt Jews MD on 01/28/2019 at 8:48:50 PM.    Final      Assessment & Plan:  Plan  I am having Sierra Ford maintain her cholecalciferol, CALCIUM-VITAMIN D PO, Coenzyme Q10, (Flaxseed, Linseed, (FLAXSEED OIL PO)), Omega-3 Fatty Acids (FISH OIL PO), blood glucose meter kit and supplies, POTASSIUM CHLORIDE PO, True Metrix Blood Glucose Test, TRUEplus Lancets 33G, metFORMIN, Praluent, Shingrix, Wheat Dextrin (BENEFIBER PO), ergocalciferol, apixaban, multivitamin, rosuvastatin, and furosemide. We will continue to administer sodium chloride.  No orders of the defined types were placed in this encounter.   Problem List Items Addressed This Visit      Unprioritized   DM (diabetes mellitus) type II uncontrolled, periph vascular disorder ([place])    hgba1c to be checked , minimize simple carbs. Increase exercise as tolerated. Continue current meds      DOE (dyspnea on exertion)    Pt has app with pulm and she will discuss possibility of card/pulm rehab  Pt is on prn O2 at home       Educated about COVID-19 virus infection    Pt received covid vaccine but does not have the vaccination      Essential hypertension    Well controlled, no changes to meds. Encouraged heart healthy diet such as the DASH diet and exercise as tolerated.       Relevant Orders   Comprehensive metabolic panel   Microalbumin / creatinine urine ratio   Hyperlipidemia associated with type 2 diabetes mellitus ([place])    Tolerating statin, encouraged heart healthy diet, avoid trans fats, minimize simple carbs and saturated fats. Increase exercise as tolerated      Relevant Orders   Lipid panel   Comprehensive metabolic panel   OSA (obstructive sleep apnea)    On cpap      Pulmonary emboli (HCC)    Stable On eliquis Pt c/o DOE-- on O2 2L prn        Other Visit Diagnoses    Uncontrolled type 2 diabetes mellitus with hyperglycemia ([place])    -  Primary   Relevant Orders   Comprehensive metabolic panel   Hemoglobin A1c   Microalbumin / creatinine urine ratio   Need for 23-polyvalent pneumococcal polysaccharide vaccine       Relevant Orders   Pneumococcal polysaccharide vaccine 23-valent greater than or equal to 2yo subcutaneous/IM      Follow-up: Return in about 6 months (around 11/07/2019), or if symptoms worsen or fail to improve, for annual exam, fasting.  Ann Held, DO

## 2019-05-07 NOTE — Patient Instructions (Signed)
Carbohydrate Counting for Diabetes Mellitus, Adult  Carbohydrate counting is a method of keeping track of how many carbohydrates you eat. Eating carbohydrates naturally increases the amount of sugar (glucose) in the blood. Counting how many carbohydrates you eat helps keep your blood glucose within normal limits, which helps you manage your diabetes (diabetes mellitus). It is important to know how many carbohydrates you can safely have in each meal. This is different for every person. A diet and nutrition specialist (registered dietitian) can help you make a meal plan and calculate how many carbohydrates you should have at each meal and snack. Carbohydrates are found in the following foods:  Grains, such as breads and cereals.  Dried beans and soy products.  Starchy vegetables, such as potatoes, peas, and corn.  Fruit and fruit juices.  Milk and yogurt.  Sweets and snack foods, such as cake, cookies, candy, chips, and soft drinks. How do I count carbohydrates? There are two ways to count carbohydrates in food. You can use either of the methods or a combination of both. Reading "Nutrition Facts" on packaged food The "Nutrition Facts" list is included on the labels of almost all packaged foods and beverages in the [place] It includes:  The serving size.  Information about nutrients in each serving, including the grams (g) of carbohydrate per serving. To use the "Nutrition Facts":  Decide how many servings you will have.  Multiply the number of servings by the number of carbohydrates per serving.  The resulting number is the total amount of carbohydrates that you will be having. Learning standard serving sizes of other foods When you eat carbohydrate foods that are not packaged or do not include "Nutrition Facts" on the label, you need to measure the servings in order to count the amount of carbohydrates:  Measure the foods that you will eat with a food scale or measuring cup, if  needed.  Decide how many standard-size servings you will eat.  Multiply the number of servings by 15. Most carbohydrate-rich foods have about 15 g of carbohydrates per serving. ? For example, if you eat 8 oz (170 g) of strawberries, you will have eaten 2 servings and 30 g of carbohydrates (2 servings x 15 g = 30 g).  For foods that have more than one food mixed, such as soups and casseroles, you must count the carbohydrates in each food that is included. The following list contains standard serving sizes of common carbohydrate-rich foods. Each of these servings has about 15 g of carbohydrates:   hamburger bun or  English muffin.   oz (15 mL) syrup.   oz (14 g) jelly.  1 slice of bread.  1 six-inch tortilla.  3 oz (85 g) cooked rice or pasta.  4 oz (113 g) cooked dried beans.  4 oz (113 g) starchy vegetable, such as peas, corn, or potatoes.  4 oz (113 g) hot cereal.  4 oz (113 g) mashed potatoes or  of a large baked potato.  4 oz (113 g) canned or frozen fruit.  4 oz (120 mL) fruit juice.  4-6 crackers.  6 chicken nuggets.  6 oz (170 g) unsweetened dry cereal.  6 oz (170 g) plain fat-free yogurt or yogurt sweetened with artificial sweeteners.  8 oz (240 mL) milk.  8 oz (170 g) fresh fruit or one small piece of fruit.  24 oz (680 g) popped popcorn. Example of carbohydrate counting Sample meal  3 oz (85 g) chicken breast.  6 oz (170 g)   brown rice.  4 oz (113 g) corn.  8 oz (240 mL) milk.  8 oz (170 g) strawberries with sugar-free whipped topping. Carbohydrate calculation 1. Identify the foods that contain carbohydrates: ? Rice. ? Corn. ? Milk. ? Strawberries. 2. Calculate how many servings you have of each food: ? 2 servings rice. ? 1 serving corn. ? 1 serving milk. ? 1 serving strawberries. 3. Multiply each number of servings by 15 g: ? 2 servings rice x 15 g = 30 g. ? 1 serving corn x 15 g = 15 g. ? 1 serving milk x 15 g = 15 g. ? 1  serving strawberries x 15 g = 15 g. 4. Add together all of the amounts to find the total grams of carbohydrates eaten: ? 30 g + 15 g + 15 g + 15 g = 75 g of carbohydrates total. Summary  Carbohydrate counting is a method of keeping track of how many carbohydrates you eat.  Eating carbohydrates naturally increases the amount of sugar (glucose) in the blood.  Counting how many carbohydrates you eat helps keep your blood glucose within normal limits, which helps you manage your diabetes.  A diet and nutrition specialist (registered dietitian) can help you make a meal plan and calculate how many carbohydrates you should have at each meal and snack. This information is not intended to replace advice given to you by your health care provider. Make sure you discuss any questions you have with your health care provider. Document Revised: 08/30/2016 Document Reviewed: 07/20/2015 Elsevier Patient Education  2020 Elsevier Inc.  

## 2019-05-07 NOTE — Assessment & Plan Note (Signed)
Stable On eliquis Pt c/o DOE-- on O2 2L prn

## 2019-05-07 NOTE — Assessment & Plan Note (Signed)
Pt has app with pulm and she will discuss possibility of card/pulm rehab  Pt is on prn O2 at home

## 2019-05-07 NOTE — Assessment & Plan Note (Signed)
On cpap 

## 2019-05-07 NOTE — Assessment & Plan Note (Signed)
Well controlled, no changes to meds. Encouraged heart healthy diet such as the DASH diet and exercise as tolerated.  °

## 2019-05-07 NOTE — Assessment & Plan Note (Signed)
Pt received covid vaccine but does not have the vaccination

## 2019-05-08 ENCOUNTER — Other Ambulatory Visit: Payer: Self-pay | Admitting: Cardiology

## 2019-05-08 MED ORDER — METFORMIN HCL 500 MG PO TABS: 500.0000 mg | ORAL_TABLET | Freq: Two times a day (BID) | ORAL | 2 refills | Status: DC

## 2019-05-08 NOTE — Telephone Encounter (Signed)
*  STAT* If patient is at the pharmacy, call can be transferred to refill team.   1. Which medications need to be refilled? (please list name of each medication and dose if known) metFORMIN (GLUCOPHAGE) 500 MG tablet  2. Which pharmacy/location (including street and city if local pharmacy) is medication to be sent to? [facility] [address], [place],  [address]   3. Do they need a 30 day or 90 day supply? [place]

## 2019-05-11 MED FILL — SHINGRIX 50 MCG SUS: 50 | 1 days supply | Qty: 1 | Fill #1

## 2019-05-12 ENCOUNTER — Telehealth: Payer: Medicare PPO | Admitting: Pulmonary Disease

## 2019-05-12 ENCOUNTER — Other Ambulatory Visit: Payer: Self-pay

## 2019-05-12 ENCOUNTER — Ambulatory Visit (INDEPENDENT_AMBULATORY_CARE_PROVIDER_SITE_OTHER): Payer: Medicare PPO | Admitting: Pulmonary Disease

## 2019-05-12 ENCOUNTER — Ambulatory Visit: Payer: Medicare PPO | Admitting: Pulmonary Disease

## 2019-05-12 ENCOUNTER — Encounter: Payer: Self-pay | Admitting: Pulmonary Disease

## 2019-05-12 DIAGNOSIS — G4733 Obstructive sleep apnea (adult) (pediatric): Secondary | ICD-10-CM | POA: Diagnosis not present

## 2019-05-12 DIAGNOSIS — J9601 Acute respiratory failure with hypoxia: Secondary | ICD-10-CM

## 2019-05-12 DIAGNOSIS — I2609 Other pulmonary embolism with acute cor pulmonale: Secondary | ICD-10-CM

## 2019-05-12 NOTE — Progress Notes (Signed)
   Subjective:    Patient ID: Sierra Ford, female    DOB: 1941/02/11, 79 y.o.   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 Follow-up    Patient is here to get new prescription for CPAP. Patient has has shortness of breath with exertion. Patient wears 2 liters as needed and with CPAP.   Unfortunately, in December she developed acute PE with R heart strain, RLE DVT Status post thrombolytic treatment , was discharged on oxygen , reviewed discharge summary  She had lost 10 pounds in 2019 but seems to have gained it back now  Follow-up visit with cardiology reviewed >> oxygen was continued, follow-up echo showed normalization of RV function .  She continues to complain of shortness of breath, while shopping she can lean on a cart but otherwise she finds it difficult when she is upright.  Oxygen tank is too heavy to look around, she asked for a lighter one.  She shows me an oxygen pressurized canister that she obtained off [place] that is for athletes to use as needed  No problems with mask or pressure. CPAP download was reviewed which shows excellent compliance on 10 cm with no residual events and minimal leak  Significant tests/ events reviewed HST 11/08/15 AHI 28/h  12/8 CTA chest PE >> Positive for acute PE with CT evidence of right heart strain (RV/LV Ratio = 1.7) consistent with at least submassive (intermediate risk)PE.   12/9 LE VAS duplex DVT >> Right: Findings consistent with acute deep vein thrombosis involving the right femoral vein, right popliteal vein, right posterior tibial veins, right peroneal veins, and right gastrocnemius veins. No cystic structure found in the popliteal fossa. Left: There is no evidence of deep vein thrombosis in the lower extremity.No cystic structure found in the popliteal fossa.  12/9 TTE >>  1. Severely dilated RV  with severely reduced function. Septum is flattened in systole/diastole consistent with RV pressure overload. RVSP is moderately elevated    Review of Systems neg for any significant sore throat, dysphagia, itching, sneezing, nasal congestion or excess/ purulent secretions, fever, chills, sweats, unintended wt loss, pleuritic or exertional cp, hempoptysis, orthopnea pnd or change in chronic leg swelling. Also denies presyncope, palpitations, heartburn, abdominal pain, nausea, vomiting, diarrhea or change in bowel or urinary habits, dysuria,hematuria, rash, arthralgias, visual complaints, headache, numbness weakness or ataxia.     Objective:   Physical Exam  Gen. Pleasant, well-nourished, in no distress ENT - no thrush, no pallor/icterus,no post nasal drip Neck: No JVD, no thyromegaly, no carotid bruits Lungs: no use of accessory muscles, no dullness to percussion, clear without rales or rhonchi  Cardiovascular: Rhythm regular, heart sounds  normal, no murmurs or gallops, no peripheral edema Musculoskeletal: No deformities, no cyanosis or clubbing         Assessment & Plan:

## 2019-05-12 NOTE — Assessment & Plan Note (Addendum)
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

## 2019-05-12 NOTE — Assessment & Plan Note (Signed)
She is very compliant and CPAP is certainly helped improve her daytime somnolence and fatigue. Continue current settings, CPAP supplies will be renewed for a year

## 2019-05-12 NOTE — Addendum Note (Signed)
Addended by: Lia Foyer R on: 05/12/2019 10:30 AM   Modules accepted: Orders

## 2019-05-12 NOTE — Patient Instructions (Signed)
CPAP is working well on current settings. CPAP supplies will be renewed for a year.  Ambulatory saturation to see if you qualify for oxygen

## 2019-05-12 NOTE — Assessment & Plan Note (Signed)
Resolved, can discontinue oxygen. Her persistent dyspnea is more related to deconditioning now, cor pulmonale has resolved on follow-up echo which I reviewed

## 2019-05-14 DIAGNOSIS — G4733 Obstructive sleep apnea (adult) (pediatric): Secondary | ICD-10-CM | POA: Diagnosis not present

## 2019-06-06 DIAGNOSIS — J9601 Acute respiratory failure with hypoxia: Secondary | ICD-10-CM | POA: Diagnosis not present

## 2019-06-06 DIAGNOSIS — R0600 Dyspnea, unspecified: Secondary | ICD-10-CM | POA: Diagnosis not present

## 2019-06-06 DIAGNOSIS — I2699 Other pulmonary embolism without acute cor pulmonale: Secondary | ICD-10-CM | POA: Diagnosis not present

## 2019-06-06 DIAGNOSIS — I2609 Other pulmonary embolism with acute cor pulmonale: Secondary | ICD-10-CM | POA: Diagnosis not present

## 2019-06-06 DIAGNOSIS — G4733 Obstructive sleep apnea (adult) (pediatric): Secondary | ICD-10-CM | POA: Diagnosis not present

## 2019-06-06 DIAGNOSIS — I5032 Chronic diastolic (congestive) heart failure: Secondary | ICD-10-CM | POA: Diagnosis not present

## 2019-06-25 DIAGNOSIS — Z6831 Body mass index (BMI) 31.0-31.9, adult: Secondary | ICD-10-CM | POA: Diagnosis not present

## 2019-06-25 DIAGNOSIS — Z1231 Encounter for screening mammogram for malignant neoplasm of breast: Secondary | ICD-10-CM | POA: Diagnosis not present

## 2019-06-25 DIAGNOSIS — Z124 Encounter for screening for malignant neoplasm of cervix: Secondary | ICD-10-CM | POA: Diagnosis not present

## 2019-07-06 DIAGNOSIS — I2699 Other pulmonary embolism without acute cor pulmonale: Secondary | ICD-10-CM | POA: Diagnosis not present

## 2019-07-06 DIAGNOSIS — I2609 Other pulmonary embolism with acute cor pulmonale: Secondary | ICD-10-CM | POA: Diagnosis not present

## 2019-07-06 DIAGNOSIS — J9601 Acute respiratory failure with hypoxia: Secondary | ICD-10-CM | POA: Diagnosis not present

## 2019-07-06 DIAGNOSIS — R0600 Dyspnea, unspecified: Secondary | ICD-10-CM | POA: Diagnosis not present

## 2019-07-06 DIAGNOSIS — I5032 Chronic diastolic (congestive) heart failure: Secondary | ICD-10-CM | POA: Diagnosis not present

## 2019-07-06 DIAGNOSIS — G4733 Obstructive sleep apnea (adult) (pediatric): Secondary | ICD-10-CM | POA: Diagnosis not present

## 2019-07-07 DIAGNOSIS — M25551 Pain in right hip: Secondary | ICD-10-CM | POA: Diagnosis not present

## 2019-07-07 DIAGNOSIS — M545 Low back pain: Secondary | ICD-10-CM | POA: Diagnosis not present

## 2019-07-15 DIAGNOSIS — Z13820 Encounter for screening for osteoporosis: Secondary | ICD-10-CM | POA: Diagnosis not present

## 2019-07-15 DIAGNOSIS — N951 Menopausal and female climacteric states: Secondary | ICD-10-CM | POA: Diagnosis not present

## 2019-07-18 DIAGNOSIS — M25551 Pain in right hip: Secondary | ICD-10-CM | POA: Diagnosis not present

## 2019-07-22 DIAGNOSIS — M545 Low back pain: Secondary | ICD-10-CM | POA: Diagnosis not present

## 2019-08-06 DIAGNOSIS — R0600 Dyspnea, unspecified: Secondary | ICD-10-CM | POA: Diagnosis not present

## 2019-08-06 DIAGNOSIS — I2609 Other pulmonary embolism with acute cor pulmonale: Secondary | ICD-10-CM | POA: Diagnosis not present

## 2019-08-06 DIAGNOSIS — I5032 Chronic diastolic (congestive) heart failure: Secondary | ICD-10-CM | POA: Diagnosis not present

## 2019-08-06 DIAGNOSIS — J9601 Acute respiratory failure with hypoxia: Secondary | ICD-10-CM | POA: Diagnosis not present

## 2019-08-06 DIAGNOSIS — G4733 Obstructive sleep apnea (adult) (pediatric): Secondary | ICD-10-CM | POA: Diagnosis not present

## 2019-08-06 DIAGNOSIS — I2699 Other pulmonary embolism without acute cor pulmonale: Secondary | ICD-10-CM | POA: Diagnosis not present

## 2019-08-07 DIAGNOSIS — M7071 Other bursitis of hip, right hip: Secondary | ICD-10-CM | POA: Diagnosis not present

## 2019-08-07 DIAGNOSIS — M25551 Pain in right hip: Secondary | ICD-10-CM | POA: Diagnosis not present

## 2019-08-12 DIAGNOSIS — G4733 Obstructive sleep apnea (adult) (pediatric): Secondary | ICD-10-CM | POA: Diagnosis not present

## 2019-08-31 ENCOUNTER — Telehealth: Payer: Self-pay | Admitting: Pharmacist

## 2019-08-31 NOTE — Telephone Encounter (Signed)
Patient called stating she needed her [employer] grant renewed. Sierra Ford renewed- sent to pharamcy. Patient made aware that pharmacy received and it has been processed.

## 2019-09-01 NOTE — Progress Notes (Signed)
Cardiology Office Note   Date:  09/03/2019   ID:  EDDIE KOC, DOB [birth year], MRN [id number]  PCP:  Carollee Herter, Alferd Apa, DO  Cardiologist:   Minus Breeding, MD  Chief Complaint  Patient presents with  . Pulmonary Embolism    History of Present Illness: Sierra Ford is a 79 y.o. female who presents for follow up of diastolic dysfunction and abnormal echo. Echo 04/16/17  demonstrated an EF of 65%I saw her last year and she returns for follow up.   She had a pulmonary embolism.   She had RV strain which eventually recovered.    She has been doing okay since I last saw her.  She is mostly bothered by some bursitis in her right hip which has limited her activities. The patient denies any new symptoms such as chest discomfort, neck or arm discomfort. There has been no new shortness of breath, PND or orthopnea. There have been no reported palpitations, presyncope or syncope.    Past Medical History:  Diagnosis Date  . Anxiety   . Back pain   . Chest pain    CLite with apical ischemia in 2006 - normal coronary arteries [facility] in 12/2004;  Myoview 11/12:  Low risk stress nuclear study with a small, partially reversible apical defect most likely related to apical thinning; cannot R/O very mild apical ischemia.  EF: 75%   . Decreased hearing   . Depression   . Diabetes ([place])   . DVT (deep venous thrombosis) ([place]) 2006   hx of  . Ectopic pregnancy with intrauterine pregnancy   . Hiatal hernia   . Hyperlipidemia   . PE (pulmonary embolism) 2006   hx of  . Sleep apnea    CPAP     Past Surgical History:  Procedure Laterality Date  . ABDOMINAL EXPLORATION SURGERY    . BUNIONECTOMY     right  . COLONOSCOPY  2009  . ECTOPIC PREGNANCY SURGERY    . FOOT SURGERY    . TUBAL LIGATION       Current Outpatient Medications  Medication Sig Dispense Refill  . Alirocumab (PRALUENT) 75 MG/ML SOAJ Inject 1 pen into the skin every 14 (fourteen) days. 2 pen 11  . apixaban  (ELIQUIS) 5 MG TABS tablet Take 1 tablet (5 mg total) by mouth 2 (two) times daily. 180 tablet 1  . blood glucose meter kit and supplies KIT Dispense based on patient and insurance preference. Use up to four times daily as directed. (FOR ICD-9 250.00, 250.01). 1 each 0  . CALCIUM-VITAMIN D PO Take 600 Units by mouth 2 (two) times daily.      . cholecalciferol (VITAMIN D-400) 400 UNITS TABS Take 400 Units by mouth 2 (two) times daily.     . Coenzyme Q10 100 MG TABS Take 100 mg by mouth daily.    . ergocalciferol (VITAMIN D2) 1.25 MG (50000 UT) capsule Vitamin D2 1,250 mcg (50,000 unit) capsule    . Flaxseed, Linseed, (FLAXSEED OIL PO) Take 1,000 mg by mouth daily.     . furosemide (LASIX) 40 MG tablet Take 1 tablet (40 mg total) by mouth daily. 90 tablet 2  . metFORMIN (GLUCOPHAGE) 500 MG tablet Take 1 tablet (500 mg total) by mouth 2 (two) times daily with a meal. 180 tablet 2  . Multiple Vitamin (MULTIVITAMIN) capsule Take 1 capsule by mouth daily.    . Omega-3 Fatty Acids (FISH OIL PO) Take 1,200 mg by mouth daily.     Marland Kitchen  POTASSIUM CHLORIDE PO Take 1 tablet by mouth as needed (muscle cramps).     . predniSONE (DELTASONE) 10 MG tablet Take 10 mg by mouth daily.    . rosuvastatin (CRESTOR) 10 MG tablet Take 1 tablet (10 mg total) by mouth daily. 90 tablet 3  . TRUE METRIX BLOOD GLUCOSE TEST test strip TEST  UP  TO FOUR TIMES DAILY AS DIRECTED 200 each 12  . TRUEPLUS LANCETS 33G MISC TEST  UP TO FOUR TIMES DAILY AS DIRECTED 200 each 12  . Zoster Vaccine Adjuvanted [facility]) injection 0.71m IM x1  Repeat in 2-6 months 0.5 mL 1   No current facility-administered medications for this visit.    Allergies:   Patient has no known allergies.     ROS:  Please see the history of present illness.   Otherwise, review of systems are positive for none.   All other systems are reviewed and negative.    PHYSICAL EXAM: VS:  BP 126/72   Pulse 79   Ht 5' 2" (1.575 m)   Wt 182 lb 12.8 oz (82.9 kg)    SpO2 99%   BMI 33.43 kg/m  , BMI Body mass index is 33.43 kg/m. GENERAL:  Well appearing NECK:  No jugular venous distention, waveform within normal limits, carotid upstroke brisk and symmetric, no bruits, no thyromegaly LUNGS:  Clear to auscultation bilaterally CHEST:  Unremarkable HEART:  PMI not displaced or sustained,S1 and S2 within normal limits, no S3, no S4, no clicks, no rubs, no murmurs ABD:  Flat, positive bowel sounds normal in frequency in pitch, no bruits, no rebound, no guarding, no midline pulsatile mass, no hepatomegaly, no splenomegaly EXT:  2 plus pulses throughout, no edema, no cyanosis no clubbing   EKG:  EKG is not ordered today.   Recent Labs: 01/31/2019: Hemoglobin 12.0; Magnesium 2.2; Platelets 202 05/07/2019: ALT 15; BUN 15; Creatinine, Ser 0.84; Potassium 4.3; Sodium 140    Lipid Panel    Component Value Date/Time   CHOL 158 05/07/2019 0932   CHOL 209 (H) 08/26/2018 1018   TRIG 81.0 05/07/2019 0932   HDL 95.30 05/07/2019 0932   HDL 101 08/26/2018 1018   CHOLHDL 2 05/07/2019 0932   VLDL 16.2 05/07/2019 0932   LDLCALC 47 05/07/2019 0932   LDLCALC 88 08/26/2018 1018   LDLDIRECT 95.9 10/06/2012 1248     Lab Results  Component Value Date   HGBA1C 6.4 05/07/2019     Wt Readings from Last 3 Encounters:  09/03/19 182 lb 12.8 oz (82.9 kg)  05/12/19 186 lb 3.2 oz (84.5 kg)  05/07/19 186 lb 12.8 oz (84.7 kg)      Other studies Reviewed: Additional studies/ records that were reviewed today include:   None Review of the above records demonstrates:  NA   ASSESSMENT AND PLAN:  ACUTE PULMONARY EMBOLISM:       I am going to keep her on the current dose of Eliquis but likely will switch to maintenance dose in the interim.  She will need lifelong Eliquis.  She still using occasional as needed oxygen.  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  No change in therapy.  DM:A1c was A1c was 6.4 which is down from 6.7 in late December.   SLEEP  APNEA:She wears CPAP.     DYSLIPIDEMIA:   LDL is 47 with an HDL of 95.  Continue current therapy.   COVID EDUCATION:   She has been vaccinated.  Current medicines are reviewed at length  with the patient today.  The patient does not have concerns regarding medicines.  The following changes have been made:  None  Labs/ tests ordered today include:  None   No orders of the defined types were placed in this encounter.    Disposition:   FU with me in three months.     Signed, Minus Breeding, MD  09/03/2019 10:39 AM     [facility]

## 2019-09-03 ENCOUNTER — Encounter: Payer: Self-pay | Admitting: Cardiology

## 2019-09-03 ENCOUNTER — Other Ambulatory Visit: Payer: Self-pay

## 2019-09-03 ENCOUNTER — Ambulatory Visit (INDEPENDENT_AMBULATORY_CARE_PROVIDER_SITE_OTHER): Payer: Medicare PPO | Admitting: Cardiology

## 2019-09-03 VITALS — BP 126/72 | HR 79 | Ht 62.0 in | Wt 182.8 lb

## 2019-09-03 DIAGNOSIS — I2609 Other pulmonary embolism with acute cor pulmonale: Secondary | ICD-10-CM | POA: Diagnosis not present

## 2019-09-03 DIAGNOSIS — E1165 Type 2 diabetes mellitus with hyperglycemia: Secondary | ICD-10-CM | POA: Diagnosis not present

## 2019-09-03 DIAGNOSIS — E785 Hyperlipidemia, unspecified: Secondary | ICD-10-CM | POA: Diagnosis not present

## 2019-09-03 DIAGNOSIS — I5032 Chronic diastolic (congestive) heart failure: Secondary | ICD-10-CM

## 2019-09-03 DIAGNOSIS — E1151 Type 2 diabetes mellitus with diabetic peripheral angiopathy without gangrene: Secondary | ICD-10-CM | POA: Diagnosis not present

## 2019-09-03 DIAGNOSIS — Z7189 Other specified counseling: Secondary | ICD-10-CM | POA: Diagnosis not present

## 2019-09-03 DIAGNOSIS — G4730 Sleep apnea, unspecified: Secondary | ICD-10-CM | POA: Diagnosis not present

## 2019-09-03 DIAGNOSIS — IMO0002 Reserved for concepts with insufficient information to code with codable children: Secondary | ICD-10-CM

## 2019-09-03 NOTE — Patient Instructions (Signed)
Medication Instructions:  NO CHANGE *If you need a refill on your cardiac medications before your next appointment, please call your pharmacy*   Lab Work: If you have labs (blood work) drawn today and your tests are completely normal, you will receive your results only by:  [place] (if you have MyChart) OR  A paper copy in the mail If you have any lab test that is abnormal or we need to change your treatment, we will call you to review the results.  Follow-Up: At [facility], you and your health needs are our priority.  As part of our continuing mission to provide you with exceptional heart care, we have created designated Provider Care Teams.  These Care Teams include your primary Cardiologist (physician) and Advanced Practice Providers (APPs -  Physician Assistants and Nurse Practitioners) who all work together to provide you with the care you need, when you need it.  We recommend signing up for the patient portal called "MyChart".  Sign up information is provided on this After Visit Summary.  MyChart is used to connect with patients for Virtual Visits (Telemedicine).  Patients are able to view lab/test results, encounter notes, upcoming appointments, etc.  Non-urgent messages can be sent to your provider as well.   To learn more about what you can do with MyChart, go to [email or web address].    Your next appointment:   3 month(s)  The format for your next appointment:   In Person  Provider:   Minus Breeding, MD

## 2019-09-05 DIAGNOSIS — G4733 Obstructive sleep apnea (adult) (pediatric): Secondary | ICD-10-CM | POA: Diagnosis not present

## 2019-09-05 DIAGNOSIS — I2699 Other pulmonary embolism without acute cor pulmonale: Secondary | ICD-10-CM | POA: Diagnosis not present

## 2019-09-05 DIAGNOSIS — I2609 Other pulmonary embolism with acute cor pulmonale: Secondary | ICD-10-CM | POA: Diagnosis not present

## 2019-09-05 DIAGNOSIS — R0600 Dyspnea, unspecified: Secondary | ICD-10-CM | POA: Diagnosis not present

## 2019-09-05 DIAGNOSIS — I5032 Chronic diastolic (congestive) heart failure: Secondary | ICD-10-CM | POA: Diagnosis not present

## 2019-09-05 DIAGNOSIS — J9601 Acute respiratory failure with hypoxia: Secondary | ICD-10-CM | POA: Diagnosis not present

## 2019-09-08 ENCOUNTER — Other Ambulatory Visit: Payer: Self-pay | Admitting: Cardiology

## 2019-10-05 ENCOUNTER — Other Ambulatory Visit: Payer: Self-pay | Admitting: Cardiology

## 2019-10-06 DIAGNOSIS — M545 Low back pain: Secondary | ICD-10-CM | POA: Diagnosis not present

## 2019-10-06 DIAGNOSIS — I5032 Chronic diastolic (congestive) heart failure: Secondary | ICD-10-CM | POA: Diagnosis not present

## 2019-10-06 DIAGNOSIS — I2609 Other pulmonary embolism with acute cor pulmonale: Secondary | ICD-10-CM | POA: Diagnosis not present

## 2019-10-06 DIAGNOSIS — R0600 Dyspnea, unspecified: Secondary | ICD-10-CM | POA: Diagnosis not present

## 2019-10-06 DIAGNOSIS — I2699 Other pulmonary embolism without acute cor pulmonale: Secondary | ICD-10-CM | POA: Diagnosis not present

## 2019-10-06 DIAGNOSIS — G4733 Obstructive sleep apnea (adult) (pediatric): Secondary | ICD-10-CM | POA: Diagnosis not present

## 2019-10-06 DIAGNOSIS — J9601 Acute respiratory failure with hypoxia: Secondary | ICD-10-CM | POA: Diagnosis not present

## 2019-11-05 LAB — HM DIABETES EYE EXAM

## 2019-11-06 DIAGNOSIS — I2609 Other pulmonary embolism with acute cor pulmonale: Secondary | ICD-10-CM | POA: Diagnosis not present

## 2019-11-06 DIAGNOSIS — I5032 Chronic diastolic (congestive) heart failure: Secondary | ICD-10-CM | POA: Diagnosis not present

## 2019-11-06 DIAGNOSIS — J9601 Acute respiratory failure with hypoxia: Secondary | ICD-10-CM | POA: Diagnosis not present

## 2019-11-06 DIAGNOSIS — R0600 Dyspnea, unspecified: Secondary | ICD-10-CM | POA: Diagnosis not present

## 2019-11-06 DIAGNOSIS — I2699 Other pulmonary embolism without acute cor pulmonale: Secondary | ICD-10-CM | POA: Diagnosis not present

## 2019-11-06 DIAGNOSIS — G4733 Obstructive sleep apnea (adult) (pediatric): Secondary | ICD-10-CM | POA: Diagnosis not present

## 2019-11-10 ENCOUNTER — Encounter: Payer: Self-pay | Admitting: Family Medicine

## 2019-11-10 ENCOUNTER — Ambulatory Visit (INDEPENDENT_AMBULATORY_CARE_PROVIDER_SITE_OTHER): Payer: Medicare PPO | Admitting: Family Medicine

## 2019-11-10 ENCOUNTER — Other Ambulatory Visit: Payer: Self-pay

## 2019-11-10 ENCOUNTER — Ambulatory Visit: Payer: Medicare PPO | Attending: Internal Medicine

## 2019-11-10 VITALS — BP 120/64 | HR 75 | Temp 98.2°F | Resp 18 | Ht 62.0 in | Wt 184.4 lb

## 2019-11-10 DIAGNOSIS — E785 Hyperlipidemia, unspecified: Secondary | ICD-10-CM | POA: Diagnosis not present

## 2019-11-10 DIAGNOSIS — E1165 Type 2 diabetes mellitus with hyperglycemia: Secondary | ICD-10-CM

## 2019-11-10 DIAGNOSIS — G4733 Obstructive sleep apnea (adult) (pediatric): Secondary | ICD-10-CM | POA: Diagnosis not present

## 2019-11-10 DIAGNOSIS — E1169 Type 2 diabetes mellitus with other specified complication: Secondary | ICD-10-CM

## 2019-11-10 DIAGNOSIS — I10 Essential (primary) hypertension: Secondary | ICD-10-CM

## 2019-11-10 DIAGNOSIS — Z23 Encounter for immunization: Secondary | ICD-10-CM

## 2019-11-10 DIAGNOSIS — M160 Bilateral primary osteoarthritis of hip: Secondary | ICD-10-CM

## 2019-11-10 DIAGNOSIS — Z0000 Encounter for general adult medical examination without abnormal findings: Secondary | ICD-10-CM | POA: Diagnosis not present

## 2019-11-10 LAB — COMPREHENSIVE METABOLIC PANEL
AG Ratio: 1.7 (calc) (ref 1.0–2.5)
ALT: 14 U/L (ref 6–29)
AST: 16 U/L (ref 10–35)
Albumin: 4.4 g/dL (ref 3.6–5.1)
Alkaline phosphatase (APISO): 67 U/L (ref 37–153)
BUN: 15 mg/dL (ref 7–25)
CO2: 29 mmol/L (ref 20–32)
Calcium: 10 mg/dL (ref 8.6–10.4)
Chloride: 100 mmol/L (ref 98–110)
Creat: 0.82 mg/dL (ref 0.60–0.93)
Globulin: 2.6 g/dL (calc) (ref 1.9–3.7)
Glucose, Bld: 112 mg/dL — ABNORMAL HIGH (ref 65–99)
Potassium: 4.3 mmol/L (ref 3.5–5.3)
Sodium: 137 mmol/L (ref 135–146)
Total Bilirubin: 1.1 mg/dL (ref 0.2–1.2)
Total Protein: 7 g/dL (ref 6.1–8.1)

## 2019-11-10 LAB — LIPID PANEL
Cholesterol: 218 mg/dL — ABNORMAL HIGH (ref <200)
HDL: 135 mg/dL (ref >=50)
LDL Cholesterol (Calc): 69 mg/dL (calc)
Non-HDL Cholesterol (Calc): 83 mg/dL (calc) (ref <130)
Total CHOL/HDL Ratio: 1.6 (calc) (ref <5.0)
Triglycerides: 68 mg/dL (ref <150)

## 2019-11-10 LAB — HEMOGLOBIN A1C
Hgb A1c MFr Bld: 6.5 % of total Hgb — ABNORMAL HIGH (ref <5.7)
Mean Plasma Glucose: 140 (calc)
eAG (mmol/L): 7.7 (calc)

## 2019-11-10 MED ORDER — CELECOXIB 100 MG PO CAPS: 100.0000 mg | ORAL_CAPSULE | Freq: Two times a day (BID) | ORAL | 3 refills | Status: DC

## 2019-11-10 NOTE — Progress Notes (Signed)
   Covid-19 Vaccination Clinic  Name:  NAKAYLA RORABAUGH    MRN: [id number] DOB: 1940-05-02  11/10/2019  Ms. Hickel was observed post Covid-19 immunization for 15 minutes without incident. She was provided with Vaccine Information Sheet and instruction to access the V-Safe system.   Ms. Aspinwall was instructed to call 911 with any severe reactions post vaccine: Marland Kitchen Difficulty breathing  . Swelling of face and throat  . A fast heartbeat  . A bad rash all over body  . Dizziness and weakness

## 2019-11-10 NOTE — Progress Notes (Signed)
Subjective:     Sierra Ford is a 79 y.o. female and is here for a comprehensive physical exam. The patient reports problems - hip pain and f/ju bp , chol dm.  HPI HYPERTENSION   Blood pressure range-not checking   Chest pain- no      Dyspnea- no Lightheadedness- no   Edema- no  Other side effects - no   Medication compliance: good Low salt diet- yes    DIABETES    Blood Sugar ranges-120 this am   Polyuria- no New Visual problems- no  Hypoglycemic symptoms- no  Other side effects-no Medication compliance - good Last eye exam- due    HYPERLIPIDEMIA  Medication compliance- good RUQ pain- no  Muscle aches- no Other side effects-no   ROS See HPI above   PMH Smoking Status noted     Social History   Socioeconomic History  . Marital status: Divorced    Spouse name: Not on file  . Number of children: Not on file  . Years of education: Not on file  . Highest education level: Not on file  Occupational History  . Occupation: Retired Scientist, research (medical)  Tobacco Use  . Smoking status: Former Smoker    Packs/day: 1.00    Years: 50.00    Pack years: 50.00    Types: Cigarettes    Quit date: 11/21/2009    Years since quitting: 9.9  . Smokeless tobacco: Never Used  Vaping Use  . Vaping Use: Never used  Substance and Sexual Activity  . Alcohol use: Yes    Comment: rare 3 times a year per pt  . Drug use: No  . Sexual activity: Not Currently    Partners: Male  Other Topics Concern  . Not on file  Social History Narrative   Regular exercise         Social Determinants of Health   Financial Resource Strain: Low Risk   . Difficulty of Paying Living Expenses: Not hard at all  Food Insecurity: No Food Insecurity  . Worried About Charity fundraiser in the Last Year: Never true  . Ran Out of Food in the Last Year: Never true  Transportation Needs: No Transportation Needs  . Lack of Transportation (Medical): No  . Lack of Transportation (Non-Medical): No  Physical  Activity:   . Days of Exercise per Week: Not on file  . Minutes of Exercise per Session: Not on file  Stress:   . Feeling of Stress : Not on file  Social Connections:   . Frequency of Communication with Friends and Family: Not on file  . Frequency of Social Gatherings with Friends and Family: Not on file  . Attends Religious Services: Not on file  . Active Member of Clubs or Organizations: Not on file  . Attends Archivist Meetings: Not on file  . Marital Status: Not on file  Intimate Partner Violence:   . Fear of Current or Ex-Partner: Not on file  . Emotionally Abused: Not on file  . Physically Abused: Not on file  . Sexually Abused: Not on file   Health Maintenance  Topic Date Due  . Hepatitis C Screening  Never done  . MAMMOGRAM  03/25/2019  . INFLUENZA VACCINE  09/20/2019  . HEMOGLOBIN A1C  11/07/2019  . FOOT EXAM  05/06/2020  . URINE MICROALBUMIN  05/06/2020  . OPHTHALMOLOGY EXAM  11/04/2020  . TETANUS/TDAP  11/05/2028  . DEXA SCAN  Completed  . COVID-19 Vaccine  Completed  .  PNA vac Low Risk Adult  Completed    The following portions of the patient's history were reviewed and updated as appropriate:  She  has a past medical history of Anxiety, Back pain, Chest pain, Decreased hearing, Depression, Diabetes ([place]), DVT (deep venous thrombosis) ([place]) (2006), Ectopic pregnancy with intrauterine pregnancy, Hiatal hernia, Hyperlipidemia, PE (pulmonary embolism) (2006), and Sleep apnea. She does not have any pertinent problems on file. She  has a past surgical history that includes Foot surgery; Abdominal exploration surgery; Ectopic pregnancy surgery; Bunionectomy; Tubal ligation; and Colonoscopy (2009). Her family history includes Alcoholism in her father; Colon polyps in her mother; Diabetes in her maternal aunt and maternal aunt; Heart failure in her mother; Hypertension in her mother; Obesity in her mother; Sudden death in her mother. She  reports that she quit  smoking about 9 years ago. Her smoking use included cigarettes. She has a 50.00 pack-year smoking history. She has never used smokeless tobacco. She reports current alcohol use. She reports that she does not use drugs. She has a current medication list which includes the following prescription(s): blood glucose meter kit and supplies, calcium-vitamin d, cholecalciferol, coenzyme q10, eliquis, ergocalciferol, flaxseed (linseed), furosemide, metformin, multivitamin, omega-3 fatty acids, potassium chloride, praluent, rosuvastatin, true metrix blood glucose test, trueplus lancets 33g, celecoxib, shingrix, [DISCONTINUED] omega-3 acid ethyl esters, and [DISCONTINUED] pravastatin. Current Outpatient Medications on File Prior to Visit  Medication Sig Dispense Refill  . blood glucose meter kit and supplies KIT Dispense based on patient and insurance preference. Use up to four times daily as directed. (FOR ICD-9 250.00, 250.01). 1 each 0  . CALCIUM-VITAMIN D PO Take 600 Units by mouth 2 (two) times daily.      . cholecalciferol (VITAMIN D-400) 400 UNITS TABS Take 400 Units by mouth 2 (two) times daily.     . Coenzyme Q10 100 MG TABS Take 100 mg by mouth daily.    Marland Kitchen ELIQUIS 5 MG TABS tablet Take 1 tablet by mouth twice daily 180 tablet 0  . ergocalciferol (VITAMIN D2) 1.25 MG (50000 UT) capsule Vitamin D2 1,250 mcg (50,000 unit) capsule    . Flaxseed, Linseed, (FLAXSEED OIL PO) Take 1,000 mg by mouth daily.     . furosemide (LASIX) 40 MG tablet Take 1 tablet (40 mg total) by mouth daily. 90 tablet 2  . metFORMIN (GLUCOPHAGE) 500 MG tablet Take 1 tablet (500 mg total) by mouth 2 (two) times daily with a meal. 180 tablet 2  . Multiple Vitamin (MULTIVITAMIN) capsule Take 1 capsule by mouth daily.    . Omega-3 Fatty Acids (FISH OIL PO) Take 1,200 mg by mouth daily.     Marland Kitchen POTASSIUM CHLORIDE PO Take 1 tablet by mouth as needed (muscle cramps).     . PRALUENT 75 MG/ML SOAJ INJECT 1 PEN INTO THE SKIN EVERY 14 DAYS 2 mL  11  . rosuvastatin (CRESTOR) 10 MG tablet Take 1 tablet (10 mg total) by mouth daily. 90 tablet 3  . TRUE METRIX BLOOD GLUCOSE TEST test strip TEST  UP  TO FOUR TIMES DAILY AS DIRECTED 200 each 12  . TRUEPLUS LANCETS 33G MISC TEST  UP TO FOUR TIMES DAILY AS DIRECTED 200 each 12  . Zoster Vaccine Adjuvanted [facility]) injection 0.78m IM x1  Repeat in 2-6 months (Patient not taking: Reported on 11/10/2019) 0.5 mL 1  . [DISCONTINUED] omega-3 acid ethyl esters (LOVAZA) 1 G capsule Take 1 g by mouth 3 (three) times daily.      . [  DISCONTINUED] pravastatin (PRAVACHOL) 20 MG tablet Take 20 mg by mouth daily.       No current facility-administered medications on file prior to visit.   She has No Known Allergies..  Review of Systems Review of Systems  Constitutional: Negative for activity change, appetite change and fatigue.  HENT: Negative for hearing loss, congestion, tinnitus and ear discharge.  dentist q39mEyes: Negative for visual disturbance (see optho q1y -- vision corrected to 20/20 with glasses).  Respiratory: Negative for cough, chest tightness and shortness of breath.   Cardiovascular: Negative for chest pain, palpitations and leg swelling.  Gastrointestinal: Negative for abdominal pain, diarrhea, constipation and abdominal distention.  Genitourinary: Negative for urgency, frequency, decreased urine volume and difficulty urinating.  Musculoskeletal: Negative for back pain, arthralgias and gait problem.  Skin: Negative for color change, pallor and rash.  Neurological: Negative for dizziness, light-headedness, numbness and headaches.  Hematological: Negative for adenopathy. Does not bruise/bleed easily.  Psychiatric/Behavioral: Negative for suicidal ideas, confusion, sleep disturbance, self-injury, dysphoric mood, decreased concentration and agitation.       Objective:    BP 120/64 (BP Location: Left Arm, Patient Position: Sitting, Cuff Size: Normal)   Pulse 75   Temp 98.2 F (36.8  C) (Oral)   Resp 18   Ht '5\' 2"'  (1.575 m)   Wt 184 lb 6.4 oz (83.6 kg)   SpO2 98%   BMI 33.73 kg/m  General appearance: alert, cooperative, appears stated age and no distress Head: Normocephalic, without obvious abnormality, atraumatic Eyes: negative findings: lids and lashes normal, conjunctivae and sclerae normal and pupils equal, round, reactive to light and accomodation Ears: cerumen b/l  Neck: no adenopathy, no carotid bruit, no JVD, supple, symmetrical, trachea midline and thyroid not enlarged, symmetric, no tenderness/mass/nodules Back: symmetric, no curvature. ROM normal. No CVA tenderness. Lungs: clear to auscultation bilaterally Breasts: gyn Heart: regular rate and rhythm, S1, S2 normal, no murmur, click, rub or gallop Abdomen: soft, non-tender; bowel sounds normal; no masses,  no organomegaly Pelvic: deferred ---gyn Extremities: extremities normal, atraumatic, no cyanosis or edema Pulses: 2+ and symmetric Skin: Skin color, texture, turgor normal. No rashes or lesions Lymph nodes: Cervical, supraclavicular, and axillary nodes normal. Neurologic: Alert and oriented X 3, normal strength and tone. Normal symmetric reflexes. Normal coordination and gait    Assessment:    Healthy female exam.      Plan:    ghm utd Check labs  See After Visit Summary for Counseling Recommendations    1. Primary osteoarthritis of both hips Try celebrex --- start bid but after 3-4 days see if 1 a day will work --- we will watch kidney function  F/u ortho prn  - celecoxib (CELEBREX) 100 MG capsule; Take 1 capsule (100 mg total) by mouth 2 (two) times daily.  Dispense: 60 capsule; Refill: 3  2. Type 2 diabetes mellitus with hyperglycemia, without long-term current use of insulin (HCC) hgba1c to be checked , minimize simple carbs. Increase exercise as tolerated. Continue current meds  - Hemoglobin A1c - Comprehensive metabolic panel - Microalbumin / creatinine urine ratio - Lipid  panel  3. Essential hypertension Well controlled, no changes to meds. Encouraged heart healthy diet such as the DASH diet and exercise as tolerated.  - Hemoglobin A1c - Comprehensive metabolic panel - Microalbumin / creatinine urine ratio - Lipid panel  4. Hyperlipidemia associated with type 2 diabetes mellitus [place] Encouraged heart healthy diet, increase exercise, avoid trans fats, consider a krill oil cap daily - Hemoglobin A1c -  Comprehensive metabolic panel - Microalbumin / creatinine urine ratio - Lipid panel  5. Preventative health care See above

## 2019-11-10 NOTE — Patient Instructions (Signed)
Preventive Care 78 Years and Older, Female Preventive care refers to lifestyle choices and visits with your health care provider that can promote health and wellness. This includes:  A yearly physical exam. This is also called an annual well check.  Regular dental and eye exams.  Immunizations.  Screening for certain conditions.  Healthy lifestyle choices, such as diet and exercise. What can I expect for my preventive care visit? Physical exam Your health care provider will check:  Height and weight. These may be used to calculate body mass index (BMI), which is a measurement that tells if you are at a healthy weight.  Heart rate and blood pressure.  Your skin for abnormal spots. Counseling Your health care provider may ask you questions about:  Alcohol, tobacco, and drug use.  Emotional well-being.  Home and relationship well-being.  Sexual activity.  Eating habits.  History of falls.  Memory and ability to understand (cognition).  Work and work Statistician.  Pregnancy and menstrual history. What immunizations do I need?  Influenza (flu) vaccine  This is recommended every year. Tetanus, diphtheria, and pertussis (Tdap) vaccine  You may need a Td booster every 10 years. Varicella (chickenpox) vaccine  You may need this vaccine if you have not already been vaccinated. Zoster (shingles) vaccine  You may need this after age 78. Pneumococcal conjugate (PCV13) vaccine  One dose is recommended after age 78. Pneumococcal polysaccharide (PPSV23) vaccine  One dose is recommended after age 78. Measles, mumps, and rubella (MMR) vaccine  You may need at least one dose of MMR if you were born in 1957 or later. You may also need a second dose. Meningococcal conjugate (MenACWY) vaccine  You may need this if you have certain conditions. Hepatitis A vaccine  You may need this if you have certain conditions or if you travel or work in places where you may be exposed  to hepatitis A. Hepatitis B vaccine  You may need this if you have certain conditions or if you travel or work in places where you may be exposed to hepatitis B. Haemophilus influenzae type b (Hib) vaccine  You may need this if you have certain conditions. You may receive vaccines as individual doses or as more than one vaccine together in one shot (combination vaccines). Talk with your health care provider about the risks and benefits of combination vaccines. What tests do I need? Blood tests  Lipid and cholesterol levels. These may be checked every 5 years, or more frequently depending on your overall health.  Hepatitis C test.  Hepatitis B test. Screening  Lung cancer screening. You may have this screening every year starting at age 78 if you have a 30-pack-year history of smoking and currently smoke or have quit within the past 15 years.  Colorectal cancer screening. All adults should have this screening starting at age 78 and continuing until age 15. Your health care provider may recommend screening at age 78 if you are at increased risk. You will have tests every 1-10 years, depending on your results and the type of screening test.  Diabetes screening. This is done by checking your blood sugar (glucose) after you have not eaten for a while (fasting). You may have this done every 1-3 years.  Mammogram. This may be done every 1-2 years. Talk with your health care provider about how often you should have regular mammograms.  BRCA-related cancer screening. This may be done if you have a family history of breast, ovarian, tubal, or peritoneal cancers.  Other tests  Sexually transmitted disease (STD) testing.  Bone density scan. This is done to screen for osteoporosis. You may have this done starting at age 78. Follow these instructions at home: Eating and drinking  Eat a diet that includes fresh fruits and vegetables, whole grains, lean protein, and low-fat dairy products. Limit  your intake of foods with high amounts of sugar, saturated fats, and salt.  Take vitamin and mineral supplements as recommended by your health care provider.  Do not drink alcohol if your health care provider tells you not to drink.  If you drink alcohol: ? Limit how much you have to 0-1 drink a day. ? Be aware of how much alcohol is in your drink. In the [place] one drink equals one 12 oz bottle of beer (355 mL), one 5 oz glass of wine (148 mL), or one 1 oz glass of hard liquor (44 mL). Lifestyle  Take daily care of your teeth and gums.  Stay active. Exercise for at least 30 minutes on 5 or more days each week.  Do not use any products that contain nicotine or tobacco, such as cigarettes, e-cigarettes, and chewing tobacco. If you need help quitting, ask your health care provider.  If you are sexually active, practice safe sex. Use a condom or other form of protection in order to prevent STIs (sexually transmitted infections).  Talk with your health care provider about taking a low-dose aspirin or statin. What's next?  Go to your health care provider once a year for a well check visit.  Ask your health care provider how often you should have your eyes and teeth checked.  Stay up to date on all vaccines. This information is not intended to replace advice given to you by your health care provider. Make sure you discuss any questions you have with your health care provider. Document Revised: 01/30/2018 Document Reviewed: 01/30/2018 Elsevier Patient Education  2020 [employer].

## 2019-11-11 LAB — MICROALBUMIN / CREATININE URINE RATIO
Creatinine, Urine: 66 mg/dL (ref 20–275)
Microalb Creat Ratio: 9 mcg/mg creat (ref <30)
Microalb, Ur: 0.6 mg/dL

## 2019-12-03 ENCOUNTER — Telehealth: Payer: Self-pay | Admitting: Family Medicine

## 2019-12-03 NOTE — Progress Notes (Signed)
  Chronic Care Management   Note  12/03/2019 Name: Sierra Ford MRN: [id number] DOB: 11-27-1940  Sierra Ford is a 79 y.o. year old female who is a primary care patient of Ann Held, DO. I reached out to Sierra Ford by phone today in response to a referral sent by Sierra Ford's PCP, Ann Held, DO.   Sierra Ford was given information about Chronic Care Management services today including:  1. CCM service includes personalized support from designated clinical staff supervised by her physician, including individualized plan of care and coordination with other care providers 2. 24/7 contact phone numbers for assistance for urgent and routine care needs. 3. Service will only be billed when office clinical staff spend 20 minutes or more in a month to coordinate care. 4. Only one practitioner may furnish and bill the service in a calendar month. 5. The patient may stop CCM services at any time (effective at the end of the month) by phone call to the office staff.   Patient agreed to services and verbal consent obtained.   Follow up plan:   Carley Perdue [employer] Scheduler

## 2019-12-04 ENCOUNTER — Ambulatory Visit (INDEPENDENT_AMBULATORY_CARE_PROVIDER_SITE_OTHER): Payer: Medicare PPO | Admitting: Family Medicine

## 2019-12-04 ENCOUNTER — Other Ambulatory Visit: Payer: Self-pay

## 2019-12-04 ENCOUNTER — Encounter: Payer: Self-pay | Admitting: Family Medicine

## 2019-12-04 VITALS — BP 112/64 | HR 66 | Temp 97.9°F | Resp 18 | Ht 62.0 in | Wt 186.6 lb

## 2019-12-04 DIAGNOSIS — H6123 Impacted cerumen, bilateral: Secondary | ICD-10-CM | POA: Diagnosis not present

## 2019-12-04 DIAGNOSIS — Z23 Encounter for immunization: Secondary | ICD-10-CM

## 2019-12-04 NOTE — Assessment & Plan Note (Signed)
Irrigated successfully  rto prn  Instructed pt to use debrox in future prior to coming in

## 2019-12-04 NOTE — Patient Instructions (Signed)
Earwax Buildup, Adult The ears produce a substance called earwax that helps keep bacteria out of the ear and protects the skin in the ear canal. Occasionally, earwax can build up in the ear and cause discomfort or hearing loss. What increases the risk? This condition is more likely to develop in people who:  Are female.  Are elderly.  Naturally produce more earwax.  Clean their ears often with cotton swabs.  Use earplugs often.  Use in-ear headphones often.  Wear hearing aids.  Have narrow ear canals.  Have earwax that is overly thick or sticky.  Have eczema.  Are dehydrated.  Have excess hair in the ear canal. What are the signs or symptoms? Symptoms of this condition include:  Reduced or muffled hearing.  A feeling of fullness in the ear or feeling that the ear is plugged.  Fluid coming from the ear.  Ear pain.  Ear itch.  Ringing in the ear.  Coughing.  An obvious piece of earwax that can be seen inside the ear canal. How is this diagnosed? This condition may be diagnosed based on:  Your symptoms.  Your medical history.  An ear exam. During the exam, your health care provider will look into your ear with an instrument called an otoscope. You may have tests, including a hearing test. How is this treated? This condition may be treated by:  Using ear drops to soften the earwax.  Having the earwax removed by a health care provider. The health care provider may: ? Flush the ear with water. ? Use an instrument that has a loop on the end (curette). ? Use a suction device.  Surgery to remove the wax buildup. This may be done in severe cases. Follow these instructions at home:   Take over-the-counter and prescription medicines only as told by your health care provider.  Do not put any objects, including cotton swabs, into your ear. You can clean the opening of your ear canal with a washcloth or facial tissue.  Follow instructions from your health care  provider about cleaning your ears. Do not over-clean your ears.  Drink enough fluid to keep your urine clear or pale yellow. This will help to thin the earwax.  Keep all follow-up visits as told by your health care provider. If earwax builds up in your ears often or if you use hearing aids, consider seeing your health care provider for routine, preventive ear cleanings. Ask your health care provider how often you should schedule your cleanings.  If you have hearing aids, clean them according to instructions from the manufacturer and your health care provider. Contact a health care provider if:  You have ear pain.  You develop a fever.  You have blood, pus, or other fluid coming from your ear.  You have hearing loss.  You have ringing in your ears that does not go away.  Your symptoms do not improve with treatment.  You feel like the room is spinning (vertigo). Summary  Earwax can build up in the ear and cause discomfort or hearing loss.  The most common symptoms of this condition include reduced or muffled hearing and a feeling of fullness in the ear or feeling that the ear is plugged.  This condition may be diagnosed based on your symptoms, your medical history, and an ear exam.  This condition may be treated by using ear drops to soften the earwax or by having the earwax removed by a health care provider.  Do not put any   objects, including cotton swabs, into your ear. You can clean the opening of your ear canal with a washcloth or facial tissue. This information is not intended to replace advice given to you by your health care provider. Make sure you discuss any questions you have with your health care provider. Document Revised: 01/18/2017 Document Reviewed: 04/18/2016 Elsevier Patient Education  2020 [employer]  

## 2019-12-04 NOTE — Progress Notes (Addendum)
Patient ID: Sierra Ford, female    DOB: 05/05/1940  Age: 79 y.o. MRN: [id number]    Subjective:  Subjective  HPI Sierra Ford presents for c/o dec hearing and ears feel full  B/l.   She would also like pneum/ flu shot b/l  No other complaints No congestion   Review of Systems  Constitutional: Negative for appetite change, diaphoresis, fatigue and unexpected weight change.  HENT: Positive for hearing loss. Negative for ear discharge, ear pain, sinus pressure, sneezing, sore throat, tinnitus, trouble swallowing and voice change.   Eyes: Negative for pain, redness and visual disturbance.  Respiratory: Negative for cough, chest tightness, shortness of breath and wheezing.   Cardiovascular: Negative for chest pain, palpitations and leg swelling.  Endocrine: Negative for cold intolerance, heat intolerance, polydipsia, polyphagia and polyuria.  Genitourinary: Negative for difficulty urinating, dysuria and frequency.  Neurological: Negative for dizziness, light-headedness, numbness and headaches.    History Past Medical History:  Diagnosis Date  . Anxiety   . Back pain   . Chest pain    CLite with apical ischemia in 2006 - normal coronary arteries by [facility] in 12/2004;  Myoview 11/12:  Low risk stress nuclear study with a small, partially reversible apical defect most likely related to apical thinning; cannot R/O very mild apical ischemia.  EF: 75%   . Decreased hearing   . Depression   . Diabetes ([place])   . DVT (deep venous thrombosis) ([place]) 2006   hx of  . Ectopic pregnancy with intrauterine pregnancy   . Hiatal hernia   . Hyperlipidemia   . PE (pulmonary embolism) 2006   hx of  . Sleep apnea    CPAP     She has a past surgical history that includes Foot surgery; Abdominal exploration surgery; Ectopic pregnancy surgery; Bunionectomy; Tubal ligation; and Colonoscopy (2009).   Her family history includes Alcoholism in her father; Colon polyps in her mother; Diabetes in her  maternal aunt and maternal aunt; Heart failure in her mother; Hypertension in her mother; Obesity in her mother; Sudden death in her mother.She reports that she quit smoking about 10 years ago. Her smoking use included cigarettes. She has a 50.00 pack-year smoking history. She has never used smokeless tobacco. She reports current alcohol use. She reports that she does not use drugs.  Current Outpatient Medications on File Prior to Visit  Medication Sig Dispense Refill  . blood glucose meter kit and supplies KIT Dispense based on patient and insurance preference. Use up to four times daily as directed. (FOR ICD-9 250.00, 250.01). 1 each 0  . CALCIUM-VITAMIN D PO Take 600 Units by mouth 2 (two) times daily.      . celecoxib (CELEBREX) 100 MG capsule Take 1 capsule (100 mg total) by mouth 2 (two) times daily. 60 capsule 3  . cholecalciferol (VITAMIN D-400) 400 UNITS TABS Take 400 Units by mouth 2 (two) times daily.     . Coenzyme Q10 100 MG TABS Take 100 mg by mouth daily.    Marland Kitchen ELIQUIS 5 MG TABS tablet Take 1 tablet by mouth twice daily 180 tablet 0  . ergocalciferol (VITAMIN D2) 1.25 MG (50000 UT) capsule Vitamin D2 1,250 mcg (50,000 unit) capsule    . Flaxseed, Linseed, (FLAXSEED OIL PO) Take 1,000 mg by mouth daily.     . furosemide (LASIX) 40 MG tablet Take 1 tablet (40 mg total) by mouth daily. 90 tablet 2  . metFORMIN (GLUCOPHAGE) 500 MG tablet Take 1 tablet (  500 mg total) by mouth 2 (two) times daily with a meal. 180 tablet 2  . Multiple Vitamin (MULTIVITAMIN) capsule Take 1 capsule by mouth daily.    . Omega-3 Fatty Acids (FISH OIL PO) Take 1,200 mg by mouth daily.     Marland Kitchen POTASSIUM CHLORIDE PO Take 1 tablet by mouth as needed (muscle cramps).     . PRALUENT 75 MG/ML SOAJ INJECT 1 PEN INTO THE SKIN EVERY 14 DAYS 2 mL 11  . rosuvastatin (CRESTOR) 10 MG tablet Take 1 tablet (10 mg total) by mouth daily. 90 tablet 3  . TRUE METRIX BLOOD GLUCOSE TEST test strip TEST  UP  TO FOUR TIMES DAILY AS  DIRECTED 200 each 12  . TRUEPLUS LANCETS 33G MISC TEST  UP TO FOUR TIMES DAILY AS DIRECTED 200 each 12  . Zoster Vaccine Adjuvanted [facility]) injection 0.31m IM x1  Repeat in 2-6 months (Patient not taking: Reported on 12/04/2019) 0.5 mL 1  . [DISCONTINUED] omega-3 acid ethyl esters (LOVAZA) 1 G capsule Take 1 g by mouth 3 (three) times daily.      . [DISCONTINUED] pravastatin (PRAVACHOL) 20 MG tablet Take 20 mg by mouth daily.       No current facility-administered medications on file prior to visit.     Objective:  Objective  Physical Exam Vitals and nursing note reviewed.  Constitutional:      Appearance: She is well-developed.  HENT:     Head: Normocephalic and atraumatic.     Right Ear: There is impacted cerumen.     Left Ear: There is impacted cerumen.     Ears:   Eyes:     Conjunctiva/sclera: Conjunctivae normal.  Neck:     Thyroid: No thyromegaly.     Vascular: No carotid bruit or JVD.  Cardiovascular:     Rate and Rhythm: Normal rate and regular rhythm.     Heart sounds: Normal heart sounds. No murmur heard.   Pulmonary:     Effort: Pulmonary effort is normal. No respiratory distress.     Breath sounds: Normal breath sounds. No wheezing or rales.  Chest:     Chest wall: No tenderness.  Musculoskeletal:     Cervical back: Normal range of motion and neck supple.  Neurological:     Mental Status: She is alert and oriented to person, place, and time.    BP 112/64 (BP Location: Right Arm, Patient Position: Sitting, Cuff Size: Large)   Pulse 66   Temp 97.9 F (36.6 C) (Oral)   Resp 18   Ht '5\' 2"'  (1.575 m)   Wt 186 lb 9.6 oz (84.6 kg)   BMI 34.13 kg/m  Wt Readings from Last 3 Encounters:  12/04/19 186 lb 9.6 oz (84.6 kg)  11/10/19 184 lb 6.4 oz (83.6 kg)  09/03/19 182 lb 12.8 oz (82.9 kg)     Lab Results  Component Value Date   WBC 5.3 01/31/2019   HGB 12.0 01/31/2019   HCT 36.2 01/31/2019   PLT 202 01/31/2019   GLUCOSE 112 (H) 11/10/2019   CHOL 218  (H) 11/10/2019   TRIG 68 11/10/2019   HDL 135 11/10/2019   LDLDIRECT 95.9 10/06/2012   LDLCALC 69 11/10/2019   ALT 14 11/10/2019   AST 16 11/10/2019   NA 137 11/10/2019   K 4.3 11/10/2019   CL 100 11/10/2019   CREATININE 0.82 11/10/2019   BUN 15 11/10/2019   CO2 29 11/10/2019   TSH 1.250 05/27/2017   INR 2.0 (  H) 01/29/2019   HGBA1C 6.5 (H) 11/10/2019   MICROALBUR 0.6 11/10/2019    DG Chest 2 View  Result Date: 01/27/2019 CLINICAL DATA:  Acute shortness of breath EXAM: CHEST - 2 VIEW COMPARISON:  01/29/2018 FINDINGS: The cardiomediastinal silhouette is unremarkable. Minimal subsegmental atelectasis/scarring within the UPPER RIGHT lung and LOWER LEFT lung noted. There is no evidence of focal airspace disease, pulmonary edema, suspicious pulmonary nodule/mass, pleural effusion, or pneumothorax. No acute bony abnormalities are identified. IMPRESSION: No evidence of acute cardiopulmonary disease. Electronically Signed   By: Margarette Canada M.D.   On: 01/27/2019 12:34   CT Angio Chest PE W/Cm &/Or Wo Cm  Result Date: 01/27/2019 CLINICAL DATA:  Shortness of breath EXAM: CT ANGIOGRAPHY CHEST WITH CONTRAST TECHNIQUE: Multidetector CT imaging of the chest was performed using the standard protocol during bolus administration of intravenous contrast. Multiplanar CT image reconstructions and MIPs were obtained to evaluate the vascular anatomy. CONTRAST:  163m OMNIPAQUE IOHEXOL 350 MG/ML SOLN COMPARISON:  None. FINDINGS: Cardiovascular: Satisfactory opacification of the pulmonary arteries. Filling defects reflecting acute pulmonary emboli are present within the pulmonary arteries beginning at the bifurcation of the main pulmonary artery with additional lobar, segmental, and subsegmental involvement bilaterally. There is enlargement of the right ventricle relative to the left with straightening of the interventricular septum. Mediastinum/Nodes: No adenopathy. Lungs/Pleura: Central airways are patent. Patchy  ground-glass density likely related to altered perfusion and atelectasis. No pleural effusion or pneumothorax. Upper Abdomen: Small hiatal hernia. Musculoskeletal: Mild degenerative changes of the included spine. Review of the MIP images confirms the above findings. IMPRESSION: Positive for acute PE with CT evidence of right heart strain (RV/LV Ratio = 1.7) consistent with at least submassive (intermediate risk) PE. The presence of right heart strain has been associated with an increased risk of morbidity and mortality. Please activate Code PE by paging [phone] These results were called by telephone at the time of interpretation on 01/27/2019 at 5:01 pm [facility] who verbally acknowledged these results. Electronically Signed   By: PMacy MisM.D.   On: 01/27/2019 17:08   ECHOCARDIOGRAM COMPLETE  Result Date: 01/28/2019   ECHOCARDIOGRAM REPORT   Patient Name:   Sierra HARLANDate of Exam: 01/28/2019 Medical Rec #:  [id number]       Height:       62.0 in Accession #:    [id number]      Weight:       193.0 lb Date of Birth:  7Jun 28, 1942       BSA:          1.88 m Patient Age:    89years         BP:           111/58 mmHg Patient Gender: F                HR:           89 bpm. Exam Location:  Inpatient Procedure: 2D Echo, Cardiac Doppler, Color Doppler and Intracardiac            Opacification Agent Indications:    Pulmonary embolus 415.19  History:        Patient has prior history of Echocardiogram examinations, most                 recent 04/16/2017. CHF, CAD, Signs/Symptoms:DOE; Risk                 Factors:Hypertension, Diabetes, Dyslipidemia  and Former Smoker.                 Pulomary embolus.  Sonographer:    Paulita Fujita RDCS Referring Phys: [place]  1. Severely dilated RV with severely reduced function. Septum is flattened in systole/diastole consistent with RV pressure overload. RVSP is moderately elevated on this study. Findings consistent with  submassive PE on review of medical record and CTA chest. Findings communicated to [name], MD @ 5:25 PM.  2. Left ventricular ejection fraction, by visual estimation, is 55 to 60%. The left ventricle has normal function. There is no left ventricular hypertrophy.  3. Definity contrast agent was given IV to delineate the left ventricular endocardial borders.  4. Right ventricular volume and pressure overload.  5. The left ventricle demonstrates regional wall motion abnormalities.  6. Global right ventricle has severely reduced systolic function.The right ventricular size is severely enlarged. No increase in right ventricular wall thickness.  7. Left atrial size was normal.  8. Right atrial size was mildly dilated.  9. Presence of pericardial fat pad. 10. The pericardial effusion is circumferential. 11. Trivial pericardial effusion is present. 12. Mild mitral annular calcification. 13. The mitral valve is degenerative. No evidence of mitral valve regurgitation. 14. The tricuspid valve is grossly normal. Tricuspid valve regurgitation is mild. 15. The aortic valve is tricuspid. Aortic valve regurgitation is not visualized. No evidence of aortic valve sclerosis or stenosis. 16. Pulmonic regurgitation is mild. 17. The pulmonic valve was grossly normal. Pulmonic valve regurgitation is mild. 18. Moderately elevated pulmonary artery systolic pressure. 19. The tricuspid regurgitant velocity is 3.55 m/s, and with an assumed right atrial pressure of 8 mmHg, the estimated right ventricular systolic pressure is moderately elevated at 58.4 mmHg. 20. The inferior vena cava is normal in size with <50% respiratory variability, suggesting right atrial pressure of 8 mmHg. 21. Changes from prior study are noted. 22. A prior study was performed on 04/16/2017. 23. RV severely dilated and with reduced function compared with prior. FINDINGS  Left Ventricle: Left ventricular ejection fraction, by visual estimation, is 55 to 60%. The  left ventricle has normal function. Definity contrast agent was given IV to delineate the left ventricular endocardial borders. The left ventricle demonstrates regional wall motion abnormalities. The left ventricular internal cavity size was the left ventricle is normal in size. There is no left ventricular hypertrophy. The interventricular septum is flattened in systole and diastole, consistent with right ventricular pressure and volume overload. The left ventricular diastology could not be evaluated due to abnormal septal motion. Right Ventricle: The right ventricular size is severely enlarged. No increase in right ventricular wall thickness. Global RV systolic function is has severely reduced systolic function. The tricuspid regurgitant velocity is 3.55 m/s, and with an assumed right atrial pressure of 8 mmHg, the estimated right ventricular systolic pressure is moderately elevated at 58.4 mmHg. Left Atrium: Left atrial size was normal in size. Right Atrium: Right atrial size was mildly dilated Pericardium: Trivial pericardial effusion is present. The pericardial effusion is circumferential. Presence of pericardial fat pad. Mitral Valve: The mitral valve is degenerative in appearance. Mild mitral annular calcification. No evidence of mitral valve regurgitation. Tricuspid Valve: The tricuspid valve is grossly normal. Tricuspid valve regurgitation is mild. Aortic Valve: The aortic valve is tricuspid. Aortic valve regurgitation is not visualized. The aortic valve is structurally normal, with no evidence of sclerosis or stenosis. Pulmonic Valve: The pulmonic valve was grossly normal. Pulmonic valve regurgitation is  mild. Pulmonic regurgitation is mild. Aorta: The aortic root is normal in size and structure. Venous: The inferior vena cava is normal in size with less than 50% respiratory variability, suggesting right atrial pressure of 8 mmHg. IAS/Shunts: No atrial level shunt detected by color flow Doppler. Additional  Comments: A prior study was performed on 04/16/2017.  LEFT VENTRICLE PLAX 2D LVIDd:         3.85 cm       Diastology LVIDs:         2.56 cm       LV e' lateral:   4.35 cm/s LV PW:         0.69 cm       LV E/e' lateral: 8.3 LV IVS:        0.67 cm       LV e' medial:    3.59 cm/s LVOT diam:     1.80 cm       LV E/e' medial:  10.0 LV SV:         40 ml LV SV Index:   20.14 LVOT Area:     2.54 cm  LV Volumes (MOD) LV area d, A4C:    20.60 cm LV area s, A4C:    15.00 cm LV major d, A4C:   6.93 cm LV major s, A4C:   6.15 cm LV vol d, MOD A4C: 51.3 ml LV vol s, MOD A4C: 30.9 ml LV SV MOD A4C:     51.3 ml RIGHT VENTRICLE RV S prime:     7.72 cm/s TAPSE (M-mode): 1.2 cm LEFT ATRIUM             Index       RIGHT ATRIUM          Index LA diam:        3.20 cm 1.70 cm/m  RA Area:     8.89 cm LA Vol (A2C):   24.6 ml 13.06 ml/m RA Volume:   17.20 ml 9.13 ml/m LA Vol (A4C):   18.4 ml 9.77 ml/m LA Biplane Vol: 21.9 ml 11.63 ml/m  AORTIC VALVE LVOT Vmax:   76.90 cm/s LVOT Vmean:  53.800 cm/s LVOT VTI:    0.123 m  AORTA Ao Root diam: 2.50 cm MITRAL VALVE                        TRICUSPID VALVE MV Area (PHT): 3.02 cm             TR Peak grad:   50.4 mmHg MV PHT:        72.79 msec           TR Vmax:        355.00 cm/s MV Decel Time: 251 msec MV E velocity: 35.90 cm/s 103 cm/s  SHUNTS MV A velocity: 60.60 cm/s 70.3 cm/s Systemic VTI:  0.12 m MV E/A ratio:  0.59       1.5       Systemic Diam: 1.80 cm  Eleonore Chiquito MD Electronically signed by Eleonore Chiquito MD Signature Date/Time: 01/28/2019/5:27:44 PM    Final    VAS [name] LOWER EXTREMITY VENOUS (DVT)  Result Date: 01/28/2019  Lower Venous Study Indications: Pulmonary embolism.  Limitations: Poor ultrasound/tissue interface, body habitus and patient positioning. Performing Technologist: Oliver Hum RVT  Examination Guidelines: A complete evaluation includes B-mode imaging, spectral Doppler, color Doppler, and power Doppler as needed of all accessible portions of each vessel.  Bilateral  testing is considered an integral part of a complete examination. Limited examinations for reoccurring indications may be performed as noted.  +---------+---------------+---------+-----------+----------+--------------+ RIGHT    CompressibilityPhasicitySpontaneityPropertiesThrombus Aging +---------+---------------+---------+-----------+----------+--------------+ CFV      Full           Yes      Yes                                 +---------+---------------+---------+-----------+----------+--------------+ SFJ      Full                                                        +---------+---------------+---------+-----------+----------+--------------+ FV Prox  Partial        No       No                   Acute          +---------+---------------+---------+-----------+----------+--------------+ FV Mid   Partial        No       No                   Acute          +---------+---------------+---------+-----------+----------+--------------+ FV DistalPartial        No       No                   Acute          +---------+---------------+---------+-----------+----------+--------------+ PFV      Full                                                        +---------+---------------+---------+-----------+----------+--------------+ POP      None           No       No                   Acute          +---------+---------------+---------+-----------+----------+--------------+ PTV      Partial                                      Acute          +---------+---------------+---------+-----------+----------+--------------+ PERO     None                                         Acute          +---------+---------------+---------+-----------+----------+--------------+ Gastroc  None                                         Acute          +---------+---------------+---------+-----------+----------+--------------+    +---------+---------------+---------+-----------+----------+--------------+ LEFT     CompressibilityPhasicitySpontaneityPropertiesThrombus Aging +---------+---------------+---------+-----------+----------+--------------+ CFV      Full  Yes      Yes                                 +---------+---------------+---------+-----------+----------+--------------+ SFJ      Full                                                        +---------+---------------+---------+-----------+----------+--------------+ FV Prox  Full                                                        +---------+---------------+---------+-----------+----------+--------------+ FV Mid   Full                                                        +---------+---------------+---------+-----------+----------+--------------+ FV DistalFull                                                        +---------+---------------+---------+-----------+----------+--------------+ PFV      Full                                                        +---------+---------------+---------+-----------+----------+--------------+ POP      Full           Yes      Yes                                 +---------+---------------+---------+-----------+----------+--------------+ PTV      Full                                                        +---------+---------------+---------+-----------+----------+--------------+ PERO     Full                                                        +---------+---------------+---------+-----------+----------+--------------+     Summary: Right: Findings consistent with acute deep vein thrombosis involving the right femoral vein, right popliteal vein, right posterior tibial veins, right peroneal veins, and right gastrocnemius veins. No cystic structure found in the popliteal fossa. Left: There is no evidence of deep vein thrombosis in the lower extremity. However, portions  of this examination were limited- see technologist comments above. No cystic structure found in the popliteal fossa.  *  See table(s) above for measurements and observations. Electronically signed by Curt Jews MD on 01/28/2019 at 8:48:50 PM.    Final      Assessment & Plan:  Plan  I am having Bonnita B. Coates maintain her cholecalciferol, CALCIUM-VITAMIN D PO, Coenzyme Q10, (Flaxseed, Linseed, (FLAXSEED OIL PO)), Omega-3 Fatty Acids (FISH OIL PO), blood glucose meter kit and supplies, POTASSIUM CHLORIDE PO, True Metrix Blood Glucose Test, TRUEplus Lancets 33G, Shingrix, ergocalciferol, multivitamin, rosuvastatin, furosemide, metFORMIN, Eliquis, Praluent, and celecoxib.  No orders of the defined types were placed in this encounter.   Problem List Items Addressed This Visit      Unprioritized   Bilateral impacted cerumen - Primary    Irrigated successfully  rto prn  Instructed pt to use debrox in future prior to coming in        Other Visit Diagnoses    Need for influenza vaccination       Relevant Orders   Flu Vaccine QUAD High Dose(Fluad) (Completed)   Need for pneumococcal vaccination       Relevant Orders   Pneumococcal polysaccharide vaccine 23-valent greater than or equal to 2yo subcutaneous/IM (Completed)      Follow-up: Return if symptoms worsen or fail to improve.  Ann Held, DO

## 2019-12-06 DIAGNOSIS — I5032 Chronic diastolic (congestive) heart failure: Secondary | ICD-10-CM | POA: Diagnosis not present

## 2019-12-06 DIAGNOSIS — I2609 Other pulmonary embolism with acute cor pulmonale: Secondary | ICD-10-CM | POA: Diagnosis not present

## 2019-12-06 DIAGNOSIS — J9601 Acute respiratory failure with hypoxia: Secondary | ICD-10-CM | POA: Diagnosis not present

## 2019-12-06 DIAGNOSIS — G4733 Obstructive sleep apnea (adult) (pediatric): Secondary | ICD-10-CM | POA: Diagnosis not present

## 2019-12-06 DIAGNOSIS — I2699 Other pulmonary embolism without acute cor pulmonale: Secondary | ICD-10-CM | POA: Diagnosis not present

## 2019-12-06 DIAGNOSIS — R0600 Dyspnea, unspecified: Secondary | ICD-10-CM | POA: Diagnosis not present

## 2019-12-08 NOTE — Progress Notes (Signed)
Cardiology Office Note   Date:  12/10/2019   ID:  Sierra Ford, DOB 11/18/1940, MRN [id number]  PCP:  Carollee Herter, Alferd Apa, DO  Cardiologist:   Minus Breeding, MD  Chief Complaint  Patient presents with  . Shortness of Breath    History of Present Illness: Sierra Ford is a 79 y.o. female who presents for follow up of diastolic dysfunction and abnormal echo. Echo 04/16/17  demonstrated an EF of 65%I saw her last year and she returns for follow up.   She had a pulmonary embolism.   She had RV strain which eventually recovered.    Since I last saw her she does have some breathlessness.  This happens with activity.  He walks with a Rollator at times that she has to go a distance.  She might get short of breath if she walks a moderate distance on level ground.  She denies any chest pressure, neck or arm discomfort.  She has not had any palpitations, presyncope or syncope.  She has had no PND or orthopnea.  She is not really using her oxygen much at night anymore.  She did meet this when she first came out of the hospital post PE.   Past Medical History:  Diagnosis Date  . Anxiety   . Back pain   . Chest pain    CLite with apical ischemia in 2006 - normal coronary arteries by [facility] in 12/2004;  Myoview 11/12:  Low risk stress nuclear study with a small, partially reversible apical defect most likely related to apical thinning; cannot R/O very mild apical ischemia.  EF: 75%   . Decreased hearing   . Depression   . Diabetes ([place])   . DVT (deep venous thrombosis) ([place]) 2006   hx of  . Ectopic pregnancy with intrauterine pregnancy   . Hiatal hernia   . Hyperlipidemia   . PE (pulmonary embolism) 2006   hx of  . Sleep apnea    CPAP     Past Surgical History:  Procedure Laterality Date  . ABDOMINAL EXPLORATION SURGERY    . BUNIONECTOMY     right  . COLONOSCOPY  2009  . ECTOPIC PREGNANCY SURGERY    . FOOT SURGERY    . TUBAL LIGATION       Current Outpatient  Medications  Medication Sig Dispense Refill  . blood glucose meter kit and supplies KIT Dispense based on patient and insurance preference. Use up to four times daily as directed. (FOR ICD-9 250.00, 250.01). 1 each 0  . CALCIUM-VITAMIN D PO Take 600 Units by mouth 2 (two) times daily.      . celecoxib (CELEBREX) 100 MG capsule Take 1 capsule (100 mg total) by mouth 2 (two) times daily. 60 capsule 3  . cholecalciferol (VITAMIN D-400) 400 UNITS TABS Take 400 Units by mouth 2 (two) times daily.     . Coenzyme Q10 100 MG TABS Take 100 mg by mouth daily.    Marland Kitchen ELIQUIS 5 MG TABS tablet Take 1 tablet by mouth twice daily 180 tablet 0  . ergocalciferol (VITAMIN D2) 1.25 MG (50000 UT) capsule Vitamin D2 1,250 mcg (50,000 unit) capsule    . Flaxseed, Linseed, (FLAXSEED OIL PO) Take 1,000 mg by mouth daily.     . furosemide (LASIX) 40 MG tablet Take 1 tablet (40 mg total) by mouth daily. 90 tablet 2  . metFORMIN (GLUCOPHAGE) 500 MG tablet Take 1 tablet (500 mg total) by mouth 2 (two)  times daily with a meal. 180 tablet 2  . Multiple Vitamin (MULTIVITAMIN) capsule Take 1 capsule by mouth daily.    . Omega-3 Fatty Acids (FISH OIL PO) Take 1,200 mg by mouth daily.     Marland Kitchen POTASSIUM CHLORIDE PO Take 1 tablet by mouth as needed (muscle cramps).     . PRALUENT 75 MG/ML SOAJ INJECT 1 PEN INTO THE SKIN EVERY 14 DAYS 2 mL 11  . rosuvastatin (CRESTOR) 10 MG tablet Take 1 tablet (10 mg total) by mouth daily. 90 tablet 3  . TRUE METRIX BLOOD GLUCOSE TEST test strip TEST  UP  TO FOUR TIMES DAILY AS DIRECTED 200 each 12  . TRUEPLUS LANCETS 33G MISC TEST  UP TO FOUR TIMES DAILY AS DIRECTED 200 each 12  . Zoster Vaccine Adjuvanted [facility]) injection 0.5m IM x1  Repeat in 2-6 months 0.5 mL 1   No current facility-administered medications for this visit.    Allergies:   Patient has no known allergies.     ROS:  Please see the history of present illness.   Otherwise, review of systems are positive for none.   All  other systems are reviewed and negative.    PHYSICAL EXAM: VS:  BP 123/75   Pulse 69   Ht '5\' 2"'  (1.575 m)   Wt 186 lb 9.6 oz (84.6 kg)   SpO2 97%   BMI 34.13 kg/m  , BMI Body mass index is 34.13 kg/m. GENERAL:  Well appearing NECK:  No jugular venous distention, waveform within normal limits, carotid upstroke brisk and symmetric, no bruits, no thyromegaly LUNGS:  Clear to auscultation bilaterally CHEST:  Unremarkable HEART:  PMI not displaced or sustained,S1 and S2 within normal limits, no S3, no S4, no clicks, no rubs, no murmurs ABD:  Flat, positive bowel sounds normal in frequency in pitch, no bruits, no rebound, no guarding, no midline pulsatile mass, no hepatomegaly, no splenomegaly EXT:  2 plus pulses throughout, no edema, no cyanosis no clubbing  EKG:  EKG is not ordered today. NA  Recent Labs: 01/31/2019: Hemoglobin 12.0; Magnesium 2.2; Platelets 202 11/10/2019: ALT 14; BUN 15; Creat 0.82; Potassium 4.3; Sodium 137    Lipid Panel    Component Value Date/Time   CHOL 218 (H) 11/10/2019 1122   CHOL 209 (H) 08/26/2018 1018   TRIG 68 11/10/2019 1122   HDL 135 11/10/2019 1122   HDL 101 08/26/2018 1018   CHOLHDL 1.6 11/10/2019 1122   VLDL 16.2 05/07/2019 0932   LDLCALC 69 11/10/2019 1122   LDLDIRECT 95.9 10/06/2012 1248     Lab Results  Component Value Date   HGBA1C 6.5 (H) 11/10/2019     Wt Readings from Last 3 Encounters:  12/10/19 186 lb 9.6 oz (84.6 kg)  12/04/19 186 lb 9.6 oz (84.6 kg)  11/10/19 184 lb 6.4 oz (83.6 kg)      Other studies Reviewed: Additional studies/ records that were reviewed today include:   None Review of the above records demonstrates:  NA   ASSESSMENT AND PLAN:  ACUTE PULMONARY EMBOLISM:     She is oxygenating well ambulating around the office today.  I am going to continue her current dose of anticoagulation until I see her back at which point I will likely put her on maintenance.  As she did maintain oxygen sats in the 90s she  can discontinue the oxygen.  DIASTOLIC HF:  I will be checking a BNP level.  She seems euvolemic.  We talked about as  needed doses of her diuretic.  We talked about salt restriction.  Some of her dyspnea may be related to weight and deconditioning as she has slowly gained weight.  DM:A1c was 6.5.  She can continue on the meds as listed.   SLEEP APNEA: She wears CPAP.    DYSLIPIDEMIA:   LDL is 69.  No change in therapy.   COVID EDUCATION:   She has been vaccinated.  Current medicines are reviewed at length with the patient today.  The patient does not have concerns regarding medicines.  The following changes have been made:  None  Labs/ tests ordered today include:  None  No orders of the defined types were placed in this encounter.    Disposition:   FU with me in 6 months   Signed, Minus Breeding, MD  12/10/2019 11:09 AM    [place]

## 2019-12-10 ENCOUNTER — Encounter: Payer: Self-pay | Admitting: Cardiology

## 2019-12-10 ENCOUNTER — Ambulatory Visit (INDEPENDENT_AMBULATORY_CARE_PROVIDER_SITE_OTHER): Payer: Medicare PPO | Admitting: Cardiology

## 2019-12-10 ENCOUNTER — Telehealth: Payer: Self-pay

## 2019-12-10 ENCOUNTER — Other Ambulatory Visit: Payer: Self-pay

## 2019-12-10 VITALS — BP 123/75 | HR 69 | Ht 62.0 in | Wt 186.6 lb

## 2019-12-10 DIAGNOSIS — I5032 Chronic diastolic (congestive) heart failure: Secondary | ICD-10-CM | POA: Diagnosis not present

## 2019-12-10 DIAGNOSIS — I2609 Other pulmonary embolism with acute cor pulmonale: Secondary | ICD-10-CM

## 2019-12-10 NOTE — Telephone Encounter (Signed)
Faxed letter to [facility] with orders to discontinue oxygen therapy per Dr. Percival Spanish.

## 2019-12-10 NOTE — Patient Instructions (Signed)
Medication Instructions:  We are discontinuing your oxygen therapy *If you need a refill on your cardiac medications before your next appointment, please call your pharmacy*   Lab Work: None ordered If you have labs (blood work) drawn today and your tests are completely normal, you will receive your results only by: Marland Kitchen MyChart Message (if you have MyChart) OR . A paper copy in the mail If you have any lab test that is abnormal or we need to change your treatment, we will call you to review the results.   Testing/Procedures: None ordered   Follow-Up: At [facility], you and your health needs are our priority.  As part of our continuing mission to provide you with exceptional heart care, we have created designated Provider Care Teams.  These Care Teams include your primary Cardiologist (physician) and Advanced Practice Providers (APPs -  Physician Assistants and Nurse Practitioners) who all work together to provide you with the care you need, when you need it.  We recommend signing up for the patient portal called "MyChart".  Sign up information is provided on this After Visit Summary.  MyChart is used to connect with patients for Virtual Visits (Telemedicine).  Patients are able to view lab/test results, encounter notes, upcoming appointments, etc.  Non-urgent messages can be sent to your provider as well.   To learn more about what you can do with MyChart, go to [email or web address].    Your next appointment:   6 month(s)  The format for your next appointment:   In Person  Provider:   Minus Breeding, MD   Other Instructions None

## 2020-01-06 DIAGNOSIS — I2609 Other pulmonary embolism with acute cor pulmonale: Secondary | ICD-10-CM | POA: Diagnosis not present

## 2020-01-06 DIAGNOSIS — R0600 Dyspnea, unspecified: Secondary | ICD-10-CM | POA: Diagnosis not present

## 2020-01-06 DIAGNOSIS — I2699 Other pulmonary embolism without acute cor pulmonale: Secondary | ICD-10-CM | POA: Diagnosis not present

## 2020-01-06 DIAGNOSIS — G4733 Obstructive sleep apnea (adult) (pediatric): Secondary | ICD-10-CM | POA: Diagnosis not present

## 2020-01-06 DIAGNOSIS — I5032 Chronic diastolic (congestive) heart failure: Secondary | ICD-10-CM | POA: Diagnosis not present

## 2020-01-06 DIAGNOSIS — J9601 Acute respiratory failure with hypoxia: Secondary | ICD-10-CM | POA: Diagnosis not present

## 2020-01-29 ENCOUNTER — Other Ambulatory Visit: Payer: Self-pay | Admitting: Cardiology

## 2020-02-05 DIAGNOSIS — G4733 Obstructive sleep apnea (adult) (pediatric): Secondary | ICD-10-CM | POA: Diagnosis not present

## 2020-02-05 DIAGNOSIS — R0600 Dyspnea, unspecified: Secondary | ICD-10-CM | POA: Diagnosis not present

## 2020-02-05 DIAGNOSIS — I5032 Chronic diastolic (congestive) heart failure: Secondary | ICD-10-CM | POA: Diagnosis not present

## 2020-02-05 DIAGNOSIS — I2609 Other pulmonary embolism with acute cor pulmonale: Secondary | ICD-10-CM | POA: Diagnosis not present

## 2020-02-05 DIAGNOSIS — I2699 Other pulmonary embolism without acute cor pulmonale: Secondary | ICD-10-CM | POA: Diagnosis not present

## 2020-02-05 DIAGNOSIS — J9601 Acute respiratory failure with hypoxia: Secondary | ICD-10-CM | POA: Diagnosis not present

## 2020-02-17 ENCOUNTER — Other Ambulatory Visit: Payer: Self-pay

## 2020-02-17 ENCOUNTER — Ambulatory Visit: Payer: 59 | Admitting: Pharmacist

## 2020-02-17 DIAGNOSIS — I10 Essential (primary) hypertension: Secondary | ICD-10-CM

## 2020-02-17 DIAGNOSIS — E1165 Type 2 diabetes mellitus with hyperglycemia: Secondary | ICD-10-CM

## 2020-02-17 DIAGNOSIS — E1169 Type 2 diabetes mellitus with other specified complication: Secondary | ICD-10-CM

## 2020-02-17 NOTE — Chronic Care Management (AMB) (Signed)
Chronic Care Management Pharmacy  Name: Sierra Ford  MRN: [id number] DOB: August 14, 1940  Chief Complaint/ HPI  Sierra Ford,  79 y.o. , female presents for their Initial CCM visit with the clinical pharmacist via telephone.  PCP : Ann Held, DO  Their chronic conditions include: Hypertension, Hyperlipidemia/Atherosclerosis of Aorta, Diabetes, Heart Failure, History of PE, Osteoarthritis  Office Visits: 12/04/19: Visit w/ Dr. Etter Sjogren - Cerumen removal, flu vaccine given, pnueumonia vaccine given. No med changes noted.   11/10/19: Visit w/ Dr. Etter Sjogren - Osteoarthritis, prescribed celecoxib 133m twice daily  Consult Visit: 12/10/19: Cardio visit w/ Dr. HPercival Spanish- No med changes noted.   10/06/19: Ortho visit w/ Dr. GGladstone Lighter 09/03/19: Cardio visit w/ Dr. HPercival Spanish- Continue current Eliquis dose, but consider maintenance dose. Lifelong eliquis recommended. No med changes noted.   Medications: Outpatient Encounter Medications as of 02/17/2020  Medication Sig Note  . CALCIUM-VITAMIN D PO Take 600 Units by mouth 2 (two) times daily.   . celecoxib (CELEBREX) 100 MG capsule Take 1 capsule (100 mg total) by mouth 2 (two) times daily.   . cholecalciferol (VITAMIN D3) 10 MCG (400 UNIT) TABS tablet Take 400 Units by mouth 2 (two) times daily.   . Coenzyme Q10 100 MG TABS Take 100 mg by mouth daily.   .Marland KitchenELIQUIS 5 MG TABS tablet Take 1 tablet by mouth twice daily   . Flaxseed, Linseed, (FLAXSEED OIL PO) Take 1,000 mg by mouth daily.    . furosemide (LASIX) 40 MG tablet Take 1 tablet (40 mg total) by mouth daily.   . metFORMIN (GLUCOPHAGE) 500 MG tablet TAKE 1 TABLET BY MOUTH 2 TIMES DAILY WITH A MEAL.   . Multiple Vitamin (MULTIVITAMIN) capsule Take 1 capsule by mouth daily.   . Omega-3 Fatty Acids (FISH OIL PO) Take 1,000 mg by mouth daily.   .Marland KitchenPOTASSIUM CHLORIDE PO Take 1 tablet by mouth as needed (muscle cramps).  01/27/2019: Pt states she takes when she takes furosemide to  mitigate cramping  . PRALUENT 75 MG/ML SOAJ INJECT 1 PEN INTO THE SKIN EVERY 14 DAYS   . rosuvastatin (CRESTOR) 10 MG tablet Take 1 tablet (10 mg total) by mouth daily.   . blood glucose meter kit and supplies KIT Dispense based on patient and insurance preference. Use up to four times daily as directed. (FOR ICD-9 250.00, 250.01).   . TRUE METRIX BLOOD GLUCOSE TEST test strip TEST  UP  TO FOUR TIMES DAILY AS DIRECTED   . TRUEPLUS LANCETS 33G MISC TEST  UP TO FOUR TIMES DAILY AS DIRECTED   . Zoster Vaccine Adjuvanted ([facility] injection 0.573mIM x1  Repeat in 2-6 months   . [DISCONTINUED] ergocalciferol (VITAMIN D2) 1.25 MG (50000 UT) capsule Vitamin D2 1,250 mcg (50,000 unit) capsule   . [DISCONTINUED] omega-3 acid ethyl esters (LOVAZA) 1 G capsule Take 1 g by mouth 3 (three) times daily.     . [DISCONTINUED] pravastatin (PRAVACHOL) 20 MG tablet Take 20 mg by mouth daily.      No facility-administered encounter medications on file as of 02/17/2020.   SDOH Screenings   Alcohol Screen: Not on file  Depression (PHQ2-9): Low Risk   . PHQ-2 Score: 0  Financial Resource Strain: Low Risk   . Difficulty of Paying Living Expenses: Not hard at all  Food Insecurity: No Food Insecurity  . Worried About RuCharity fundraisern the Last Year: Never true  . Ran Out of Food in  the Last Year: Never true  Housing: Low Risk   . Last Housing Risk Score: 0  Physical Activity: Not on file  Social Connections: Not on file  Stress: Not on file  Tobacco Use: Medium Risk  . Smoking Tobacco Use: Former Smoker  . Smokeless Tobacco Use: Never Used  Transportation Needs: No Transportation Needs  . Lack of Transportation (Medical): No  . Lack of Transportation (Non-Medical): No     Current Diagnosis/Assessment:  Goals Addressed            This Visit's Progress   . Chronic Care Management Pharmacy Care Plan       CARE PLAN ENTRY (see longitudinal plan of care for additional care plan  information)  Current Barriers:  . Chronic Disease Management support, education, and care coordination needs related to Hypertension, Hyperlipidemia/Atherosclerosis of Aorta, Diabetes, Heart Failure, History of PE, Osteoarthritis   Hypertension BP Readings from Last 3 Encounters:  12/10/19 123/75  12/04/19 112/64  11/10/19 120/64   . Pharmacist Clinical Goal(s): o Over the next 180 days, patient will work with PharmD and providers to maintain BP goal <130/80 . Current regimen:  o Diet and exercise management   . Interventions: o Discussed BP goal . Patient self care activities - Over the next 180 days, patient will: o Maintain BP less than 130/80  Hyperlipidemia Lab Results  Component Value Date/Time   LDLCALC 69 11/10/2019 11:22 AM   LDLDIRECT 95.9 10/06/2012 12:48 PM   . Pharmacist Clinical Goal(s): o Over the next 180 days, patient will work with PharmD and providers to maintain LDL goal < 70 . Current regimen:  . Rosuvastatin 36m daily . Praluent 728mevery 14 days  . Flaxseed Oil 100040maily . Interventions: o Discussed LDL goal o Encouraged follow up with cardiology around April 2022 . Patient self care activities - Over the next 180 days, patient will: o Maintain cholesterol medication regimen.   Diabetes Lab Results  Component Value Date/Time   HGBA1C 6.5 (H) 11/10/2019 11:22 AM   HGBA1C 6.4 05/07/2019 09:32 AM   HGBA1C 6.7 12/28/2016 12:00 AM   . Pharmacist Clinical Goal(s): o Over the next 180 days, patient will work with PharmD and providers to maintain A1c goal <7% . Current regimen:  o Metformin 500m52mice daily . Interventions: o Discussed DM goal . Patient self care activities - Over the next 180 days, patient will: o Check blood sugar once daily, document, and provide at future appointments o Contact provider with any episodes of hypoglycemia  Osteopenia/Osteoporosis Screening . Pharmacist Clinical Goal(s) o Over the next 180 days, patient  will work with PharmD and providers to reduce risk of fracture due to osteopenia/osteoporosis . Current regimen:  . Calcium/Vitamin 600mg60m units twice daily  . Vitamin D 400mg 23me daily . Interventions: o Discussed intake of 1200mg o50mlcium daily through diet and/or supplementation o Discussed intake of [phone] units of vitamin D through supplementation  o Encouraged patient to have most recent DEXA Scan sent to office . Patient self care activities - Over the next 180 days, patient will: o Get results of most recent DEXA Scan sent to office  Medication management . Pharmacist Clinical Goal(s): o Over the next 180 days, patient will work with PharmD and providers to maintain optimal medication adherence . Current [facility] . Interventions o Comprehensive medication review performed. o Continue current medication management strategy . Patient self care activities - Over the next 180 days, patient will: o  Focus on medication adherence by filling and taking medications appropriately  o Take medications as prescribed o Report any questions or concerns to PharmD and/or provider(s)  Initial goal documentation        Hypertension   BP goal is:  <130/80  Office blood pressures are  BP Readings from Last 3 Encounters:  12/10/19 123/75  12/04/19 112/64  11/10/19 120/64   Patient checks BP at home infrequently Patient home BP readings are ranging: Unable to assess  Patient has failed these meds in the past: None noted  Patient is currently controlled on the following medications:  . None  We discussed BP goal  Plan -Continue control with diet and exercise     Hyperlipidemia/Atherosclerosis of Aorta   LDL goal < 70  Last lipids Lab Results  Component Value Date   CHOL 218 (H) 11/10/2019   HDL 135 11/10/2019   LDLCALC 69 11/10/2019   LDLDIRECT 95.9 10/06/2012   TRIG 68 11/10/2019   CHOLHDL 1.6 11/10/2019   Hepatic Function Latest Ref  Rng & Units 11/10/2019 05/07/2019 02/16/2019  Total Protein 6.1 - 8.1 g/dL 7.0 6.7 7.2  Albumin 3.5 - 5.2 g/dL - 4.1 4.6  AST 10 - 35 U/L '16 17 18  ' ALT 6 - 29 U/L '14 15 17  ' Alk Phosphatase 39 - 117 U/L - 76 76  Total Bilirubin 0.2 - 1.2 mg/dL 1.1 0.7 1.4(H)  Bilirubin, Direct 0.00 - 0.40 mg/dL - - -     The ASCVD Risk score ([place] et al., 2013) failed to calculate for the following reasons:   The valid HDL cholesterol range is 20 to 100 mg/dL   Patient has failed these meds in past: atorvastatin (myalgia) Patient is currently controlled on the following medications:  . Rosuvastatin 86m daily . Praluent 762mevery 14 days (thinking about "slowing up on this" due to joint pain; shoulder and hips) . Flaxseed Oil 100035maily  L arm > R Can barely lift her arms to remove T-shirt She feels like her hands and arms "go to sleep" at night. States this has been ongoing for months. Getting progressively worse  We discussed:  LDL  Plan -Follow up with cardiology around April 2022 -Continue current medications  Diabetes   A1c goal <7%  Recent Relevant Labs: Lab Results  Component Value Date/Time   HGBA1C 6.5 (H) 11/10/2019 11:22 AM   HGBA1C 6.4 05/07/2019 09:32 AM   HGBA1C 6.7 12/28/2016 12:00 AM   GFR 79.42 05/07/2019 09:32 AM   GFR 74.34 02/16/2019 12:00 PM   MICROALBUR 0.6 11/10/2019 11:22 AM   MICROALBUR 2.3 (H) 05/07/2019 09:32 AM    Last diabetic Eye exam:  Lab Results  Component Value Date/Time   HMDIABEYEEXA No Retinopathy 11/05/2019 12:00 AM    Last diabetic Foot exam: No results found for: HMDIABFOOTEX   Checking BG: Rarely  Recent FBG Readings: 120-130 per patient  Patient has failed these meds in past: None noted  Patient is currently controlled on the following medications: . MMarland Kitchentformin 500m79mice daily  We discussed: DM goal  Plan -Continue current medications  Heart Failure   Type: Diastolic  Last ejection fraction: 03/12/2019 55-60% NYHA  Class: II (slight limitation of activity) AHA HF Stage: C (Heart disease and symptoms present)  Patient has failed these meds in past: None noted  Patient is currently controlled on the following medications:   Furosemide 40mg82mly  Weighs daily? Yes Edema? Only when she doesn't take furosemide; when she  goes shopping (increases 2-3lbs) Shortness of Breath? With exertion  Uses cane or rollator when shopping  Plan -Continue current medications   Hx of Pulmonary Embolism    Patient has failed these meds in past: None noted Patient is currently controlled on the following medications: . Eliquis 72m twice daily  Most recent occurrence was 2nd time with PE. On eliquis for life.   Plan -Continue current medications   Osteoarthritis/Pain    Patient has failed these meds in past: None noted  Patient is currently controlled on the following medications: . Celecoxib 1057mtwice daily (only takes as needed)  Helps a little, but doesn't completely resolve pain  Plan -Continue current medications   Osteopenia / Osteoporosis Screening   Last DEXA Scan: None noted in chart   Vit D, 25-Hydroxy  Date Value Ref Range Status  01/28/2018 39.1 30.0 - 100.0 ng/mL Final    Comment:    Vitamin D deficiency has been defined by the [place] guideline as a level of serum 25-OH vitamin D less than 20 ng/mL (1,2). The Endocrine Society went on to further define vitamin D insufficiency as a level between 21 and 29 ng/mL (2). 1. IOM (Institute of Medicine). 2010. Dietary reference    intakes for calcium and D. [place]    [employer]2. Holick MF, Binkley [place], Bischoff-Ferrari HA, et al.    Evaluation, treatment, and prevention of vitamin D    deficiency: an Endocrine Society clinical practice    guideline. JCEM. 2011 Jul; 96(7):1911-30.      Patient has failed these meds in past: bisphosphonate? Patient is  currently controlled on the following medications:  . Calcium/Vitamin 6001m00 units twice daily  . Vitamin D 400m36mice daily  States she gets DEXA Scan done by Ob/Gyn at same time as mammogram and pelvic exam.  She states she has had one in the last 4-5 years.  Last mammogram was 2019, so suspect she had it during then? She was told she a "good candidate for osteoporosis" States she took a medication, but couldn't tolerate it. She can't recall the medication. This happened about 10 years ago. She can't recall what the intoleration was. She states she was instructed to take 2000 units of vitamin D daily  We discussed:  Recommend [phone] units of vitamin D daily. Recommend 1200 mg of calcium daily from dietary and supplemental sources.  Plan -Have most recent bone density scan sent to Dr. LownNonda Louice -Continue current medications  Vaccines   Reviewed and discussed patient's vaccination history.   Patient is up to date on vaccines  Immunization History  Administered Date(s) Administered  . Fluad Quad(high Dose 65+) 12/04/2019  . Influenza, High Dose Seasonal PF 12/22/2018  . PFIZER SARS-COV-2 Vaccination 03/06/2019, 03/27/2019, 11/10/2019  . Pneumococcal Conjugate-13 04/24/2013  . Pneumococcal Polysaccharide-23 08/07/2007, 12/04/2019  . Td 03/03/2003  . Tdap 11/06/2018  . Zoster 08/14/2007, 11/13/2007  . Zoster Recombinat (Shingrix) 11/06/2018, 05/11/2019    Medication Management   [id number] [phone]: [phone]  Continue current medication management strategy  Follow up:  6 month phone visit  KaneMelvenia Beam, PharmD, BCACP Clinical Pharmacist [facility]-[phone]

## 2020-02-17 NOTE — Patient Instructions (Addendum)
Visit Information  Goals Addressed            This Visit's Progress   . Chronic Care Management Pharmacy Care Plan       CARE PLAN ENTRY (see longitudinal plan of care for additional care plan information)  Current Barriers:  . Chronic Disease Management support, education, and care coordination needs related to Hypertension, Hyperlipidemia/Atherosclerosis of Aorta, Diabetes, Heart Failure, History of PE, Osteoarthritis   Hypertension BP Readings from Last 3 Encounters:  12/10/19 123/75  12/04/19 112/64  11/10/19 120/64   . Pharmacist Clinical Goal(s): o Over the next 180 days, patient will work with PharmD and providers to maintain BP goal <130/80 . Current regimen:  o Diet and exercise management   . Interventions: o Discussed BP goal . Patient self care activities - Over the next 180 days, patient will: o Maintain BP less than 130/80  Hyperlipidemia Lab Results  Component Value Date/Time   LDLCALC 69 11/10/2019 11:22 AM   LDLDIRECT 95.9 10/06/2012 12:48 PM   . Pharmacist Clinical Goal(s): o Over the next 180 days, patient will work with PharmD and providers to maintain LDL goal < 70 . Current regimen:  . Rosuvastatin 10mg  daily . Praluent 75mg  every 14 days  . Flaxseed Oil 1000mg  daily . Interventions: o Discussed LDL goal o Encouraged follow up with cardiology around April 2022 . Patient self care activities - Over the next 180 days, patient will: o Maintain cholesterol medication regimen.   Diabetes Lab Results  Component Value Date/Time   HGBA1C 6.5 (H) 11/10/2019 11:22 AM   HGBA1C 6.4 05/07/2019 09:32 AM   HGBA1C 6.7 12/28/2016 12:00 AM   . Pharmacist Clinical Goal(s): o Over the next 180 days, patient will work with PharmD and providers to maintain A1c goal <7% . Current regimen:  o Metformin 500mg  twice daily . Interventions: o Discussed DM goal . Patient self care activities - Over the next 180 days, patient will: o Check blood sugar once daily,  document, and provide at future appointments o Contact provider with any episodes of hypoglycemia  Osteopenia/Osteoporosis Screening . Pharmacist Clinical Goal(s) o Over the next 180 days, patient will work with PharmD and providers to reduce risk of fracture due to osteopenia/osteoporosis . Current regimen:  . Calcium/Vitamin 600mg /800 units twice daily  . Vitamin D 400mg  twice daily . Interventions: o Discussed intake of 1200mg  of calcium daily through diet and/or supplementation o Discussed intake of [phone] units of vitamin D through supplementation  o Encouraged patient to have most recent DEXA Scan sent to office . Patient self care activities - Over the next 180 days, patient will: o Get results of most recent DEXA Scan sent to office  Medication management . Pharmacist Clinical Goal(s): o Over the next 180 days, patient will work with PharmD and providers to maintain optimal medication adherence . Current pharmacy: [facility] . Interventions o Comprehensive medication review performed. o Continue current medication management strategy . Patient self care activities - Over the next 180 days, patient will: o Focus on medication adherence by filling and taking medications appropriately  o Take medications as prescribed o Report any questions or concerns to PharmD and/or provider(s)  Initial goal documentation        Sierra Ford was given information about Chronic Care Management services today including:  1. CCM service includes personalized support from designated clinical staff supervised by her physician, including individualized plan of care and coordination with other care providers 2. 24/7 contact  phone numbers for assistance for urgent and routine care needs. 3. Standard insurance, coinsurance, copays and deductibles apply for chronic care management only during months in which we provide at least 20 minutes of these services. Most insurances cover these  services at 100%, however patients may be responsible for any copay, coinsurance and/or deductible if applicable. This service may help you avoid the need for more expensive face-to-face services. 4. Only one practitioner may furnish and bill the service in a calendar month. 5. The patient may stop CCM services at any time (effective at the end of the month) by phone call to the office staff.  Patient agreed to services and verbal consent obtained.   The patient verbalized understanding of instructions, educational materials, and care plan provided today and agreed to receive a mailed copy of patient instructions, educational materials, and care plan.  Telephone follow up appointment with pharmacy team member scheduled for: 08/17/2020  Dannielle Karvonen Anna Livers, [facility]    Cholesterol Content in Foods Cholesterol is a waxy, fat-like substance that helps to carry fat in the blood. The body needs cholesterol in small amounts, but too much cholesterol can cause damage to the arteries and heart. Most people should eat less than 200 milligrams (mg) of cholesterol a [name]. Foods with cholesterol  Cholesterol is found in animal-based foods, such as meat, seafood, and dairy. Generally, low-fat dairy and lean meats have less cholesterol than full-fat dairy and fatty meats. The milligrams of cholesterol per serving (mg per serving) of common cholesterol-containing foods are listed below. Meat and other proteins  Egg -- one large whole egg has 186 mg.  Veal shank -- 4 oz has 141 mg.  Lean ground [place] (93% lean) -- 4 oz has 118 mg.  Fat-trimmed lamb loin -- 4 oz has 106 mg.  Lean ground beef (90% lean) -- 4 oz has 100 mg.  Lobster -- 3.5 oz has 90 mg.  Pork loin chops -- 4 oz has 86 mg.  Canned salmon -- 3.5 oz has 83 mg.  Fat-trimmed beef top loin -- 4 oz has 78 mg.  Frankfurter -- 1 frank (3.5 oz) has 77 mg.  Crab -- 3.5 oz has 71 mg.  Roasted chicken without skin, white meat -- 4 oz has 66 mg.  Light  bologna -- 2 oz has 45 mg.  Deli-cut [place] -- 2 oz has 31 mg.  Canned tuna -- 3.5 oz has 31 mg.  [name] -- 1 oz has 29 mg.  Oysters and mussels (raw) -- 3.5 oz has 25 mg.  Mackerel -- 1 oz has 22 mg.  Trout -- 1 oz has 20 mg.  Pork sausage -- 1 link (1 oz) has 17 mg.  Salmon -- 1 oz has 16 mg.  Tilapia -- 1 oz has 14 mg. Dairy  Soft-serve ice cream --  cup (4 oz) has 103 mg.  Whole-milk yogurt -- 1 cup (8 oz) has 29 mg.  Cheddar cheese -- 1 oz has 28 mg.  American cheese -- 1 oz has 28 mg.  Whole milk -- 1 cup (8 oz) has 23 mg.  2% milk -- 1 cup (8 oz) has 18 mg.  Cream cheese -- 1 tablespoon (Tbsp) has 15 mg.  Cottage cheese --  cup (4 oz) has 14 mg.  Low-fat (1%) milk -- 1 cup (8 oz) has 10 mg.  Sour cream -- 1 Tbsp has 8.5 mg.  Low-fat yogurt -- 1 cup (8 oz) has 8 mg.  Nonfat Greek yogurt --  1 cup (8 oz) has 7 mg.  Half-and-half cream -- 1 Tbsp has 5 mg. Fats and oils  Cod liver oil -- 1 tablespoon (Tbsp) has 82 mg.  Butter -- 1 Tbsp has 15 mg.  Lard -- 1 Tbsp has 14 mg.  Bacon grease -- 1 Tbsp has 14 mg.  Mayonnaise -- 1 Tbsp has 5-10 mg.  Margarine -- 1 Tbsp has 3-10 mg. Exact amounts of cholesterol in these foods may vary depending on specific ingredients and brands. Foods without cholesterol Most plant-based foods do not have cholesterol unless you combine them with a food that has cholesterol. Foods without cholesterol include:  Grains and cereals.  Vegetables.  Fruits.  Vegetable oils, such as olive, canola, and sunflower oil.  Legumes, such as peas, beans, and lentils.  Nuts and seeds.  Egg whites. Summary  The body needs cholesterol in small amounts, but too much cholesterol can cause damage to the arteries and heart.  Most people should eat less than 200 milligrams (mg) of cholesterol a Saif Peter. This information is not intended to replace advice given to you by your health care provider. Make sure you discuss any questions you  have with your health care provider. Document Revised: 01/18/2017 Document Reviewed: 10/02/2016 Elsevier Patient Education  [place].

## 2020-02-23 DIAGNOSIS — M25511 Pain in right shoulder: Secondary | ICD-10-CM | POA: Diagnosis not present

## 2020-02-23 DIAGNOSIS — M25512 Pain in left shoulder: Secondary | ICD-10-CM | POA: Diagnosis not present

## 2020-02-24 ENCOUNTER — Telehealth: Payer: Self-pay | Admitting: Cardiology

## 2020-02-24 NOTE — Telephone Encounter (Signed)
Pt c/o medication issue:  1. Name of Medication: PRALUENT 75 MG/ML SOAJ  2. How are you currently taking this medication (dosage and times per day)? As directed   3. Are you having a reaction (difficulty breathing--STAT)? no  4. What is your medication issue? Patient got a letter from [facility] about getting this rx from [facility]. She is not sure if that would be safe for her to get in the mail because it needs to be refrigerated. She is not sure what to do. Please advise    [phone] is the [facility] Customer Service

## 2020-02-24 NOTE — Telephone Encounter (Signed)
LMOM for patient.  Explained that medication comes in insulated box with cold pack and is safe to receive from Mail order phamacies

## 2020-03-07 DIAGNOSIS — I2609 Other pulmonary embolism with acute cor pulmonale: Secondary | ICD-10-CM | POA: Diagnosis not present

## 2020-03-07 DIAGNOSIS — R0600 Dyspnea, unspecified: Secondary | ICD-10-CM | POA: Diagnosis not present

## 2020-03-07 DIAGNOSIS — I5032 Chronic diastolic (congestive) heart failure: Secondary | ICD-10-CM | POA: Diagnosis not present

## 2020-03-07 DIAGNOSIS — G4733 Obstructive sleep apnea (adult) (pediatric): Secondary | ICD-10-CM | POA: Diagnosis not present

## 2020-03-07 DIAGNOSIS — J9601 Acute respiratory failure with hypoxia: Secondary | ICD-10-CM | POA: Diagnosis not present

## 2020-03-07 DIAGNOSIS — I2699 Other pulmonary embolism without acute cor pulmonale: Secondary | ICD-10-CM | POA: Diagnosis not present

## 2020-04-07 DIAGNOSIS — R0600 Dyspnea, unspecified: Secondary | ICD-10-CM | POA: Diagnosis not present

## 2020-04-07 DIAGNOSIS — J9601 Acute respiratory failure with hypoxia: Secondary | ICD-10-CM | POA: Diagnosis not present

## 2020-04-07 DIAGNOSIS — I2699 Other pulmonary embolism without acute cor pulmonale: Secondary | ICD-10-CM | POA: Diagnosis not present

## 2020-04-07 DIAGNOSIS — I2609 Other pulmonary embolism with acute cor pulmonale: Secondary | ICD-10-CM | POA: Diagnosis not present

## 2020-04-07 DIAGNOSIS — I5032 Chronic diastolic (congestive) heart failure: Secondary | ICD-10-CM | POA: Diagnosis not present

## 2020-04-07 DIAGNOSIS — G4733 Obstructive sleep apnea (adult) (pediatric): Secondary | ICD-10-CM | POA: Diagnosis not present

## 2020-04-28 ENCOUNTER — Telehealth: Payer: Self-pay | Admitting: Pharmacist

## 2020-04-28 NOTE — Progress Notes (Addendum)
Chronic Care Management Pharmacy Assistant   Name: VAUDINE DUTAN  MRN: [id number] DOB: 04-27-1940  Reason for Encounter: Disease State For DM   Conditions to be addressed/monitored: Hypertension, Hyperlipidemia/Atherosclerosis of Aorta, Diabetes, Heart Failure, History of PE, Osteoarthritis.  Recent office visits: None since 02/17/20  Recent consult visits: None since 02/17/20  Hospital visits:  None in previous 6 months  Medications: Outpatient Encounter Medications as of 04/28/2020  Medication Sig Note   blood glucose meter kit and supplies KIT Dispense based on patient and insurance preference. Use up to four times daily as directed. (FOR ICD-9 250.00, 250.01).    CALCIUM-VITAMIN D PO Take 600 Units by mouth 2 (two) times daily.    celecoxib (CELEBREX) 100 MG capsule Take 1 capsule (100 mg total) by mouth 2 (two) times daily.    cholecalciferol (VITAMIN D3) 10 MCG (400 UNIT) TABS tablet Take 400 Units by mouth 2 (two) times daily.    Coenzyme Q10 100 MG TABS Take 100 mg by mouth daily.    ELIQUIS 5 MG TABS tablet Take 1 tablet by mouth twice daily    Flaxseed, Linseed, (FLAXSEED OIL PO) Take 1,000 mg by mouth daily.     furosemide (LASIX) 40 MG tablet Take 1 tablet (40 mg total) by mouth daily.    metFORMIN (GLUCOPHAGE) 500 MG tablet TAKE 1 TABLET BY MOUTH 2 TIMES DAILY WITH A MEAL.    Multiple Vitamin (MULTIVITAMIN) capsule Take 1 capsule by mouth daily.    Omega-3 Fatty Acids (FISH OIL PO) Take 1,000 mg by mouth daily.    POTASSIUM CHLORIDE PO Take 1 tablet by mouth as needed (muscle cramps).  01/27/2019: Pt states she takes when she takes furosemide to mitigate cramping   PRALUENT 75 MG/ML SOAJ INJECT 1 PEN INTO THE SKIN EVERY 14 DAYS    rosuvastatin (CRESTOR) 10 MG tablet Take 1 tablet (10 mg total) by mouth daily.    TRUE METRIX BLOOD GLUCOSE TEST test strip TEST  UP  TO FOUR TIMES DAILY AS DIRECTED    TRUEPLUS LANCETS 33G MISC TEST  UP TO FOUR TIMES DAILY AS DIRECTED     Zoster Vaccine Adjuvanted [facility]) injection 0.38m IM x1  Repeat in 2-6 months    [DISCONTINUED] omega-3 acid ethyl esters (LOVAZA) 1 G capsule Take 1 g by mouth 3 (three) times daily.      [DISCONTINUED] pravastatin (PRAVACHOL) 20 MG tablet Take 20 mg by mouth daily.      No facility-administered encounter medications on file as of 04/28/2020.   Recent Relevant Labs: Lab Results  Component Value Date/Time   HGBA1C 6.5 (H) 11/10/2019 11:22 AM   HGBA1C 6.4 05/07/2019 09:32 AM   HGBA1C 6.7 12/28/2016 12:00 AM   MICROALBUR 0.6 11/10/2019 11:22 AM   MICROALBUR 2.3 (H) 05/07/2019 09:32 AM    Kidney Function Lab Results  Component Value Date/Time   CREATININE 0.82 11/10/2019 11:22 AM   CREATININE 0.84 05/07/2019 09:32 AM   CREATININE 0.89 02/16/2019 12:00 PM   CREATININE 0.82 10/04/2015 08:45 AM   GFR 79.42 05/07/2019 09:32 AM   GFRNONAA >60 01/31/2019 02:30 AM   GFRNONAA 71 10/04/2015 08:45 AM   GFRAA >60 01/31/2019 02:30 AM   GFRAA 82 10/04/2015 08:45 AM    Current antihyperglycemic regimen:  Metformin 500 mg twice daily  What recent interventions/DTPs have been made to improve glycemic control:  None.  Have there been any recent hospitalizations or ED visits since last visit with CPP? Patient stated no.  Patient reports hypoglycemic symptoms, including Vision changes   Patient reports hyperglycemic symptoms, including blurry vision and weakness   How often are you checking your blood sugar? Patient stated she checks her sugar once a week.  What are your blood sugars ranging? Patient was not at home to give me any readings.   During the week, how often does your blood glucose drop below 70? Patient stated Never.  Are you checking your feet daily/regularly?  Patient stated she checks her feet daily.  Adherence Review: Is the patient currently on a STATIN medication? Yes, Rosuvastatin 10 mg   Is the patient currently on ACE/ARB medication? No.  Does the patient  have >5 day gap between last estimated fill dates? Per Misc rpts, No.  Star Rating Drugs: Metformin 500 mg 1 tablet 2 times daily 01/29/20 90 DS , Rosuvastatin 10 mg 1 tablet daily 02/05/20 90 DS  Patient stated she has noticed her arms going numb to an extend that they hurt. She stated it happens when she wakes up from sleeping and wants to know what could be the cause of this, arthritis, medication, etc? She also stated she wanted to talk to the pharmacist or Dr. Etter Sjogren about how harsh is metformin on her kidneys and is wanting to know if she could take a different medication if metformin is going to cause kidney damage. She stated all of her medications are within her budget at this time.  Follow-Up: Pharmacist Review  Charlann Lange, RMA Clinical Pharmacist Assistant [phone]  10 minutes spent in review, coordination, and documentation.  Reviewed by: Beverly Milch, PharmD Clinical Pharmacist [facility] [phone]

## 2020-05-05 DIAGNOSIS — I5032 Chronic diastolic (congestive) heart failure: Secondary | ICD-10-CM | POA: Diagnosis not present

## 2020-05-05 DIAGNOSIS — I2699 Other pulmonary embolism without acute cor pulmonale: Secondary | ICD-10-CM | POA: Diagnosis not present

## 2020-05-05 DIAGNOSIS — R0600 Dyspnea, unspecified: Secondary | ICD-10-CM | POA: Diagnosis not present

## 2020-05-05 DIAGNOSIS — G4733 Obstructive sleep apnea (adult) (pediatric): Secondary | ICD-10-CM | POA: Diagnosis not present

## 2020-05-05 DIAGNOSIS — I2609 Other pulmonary embolism with acute cor pulmonale: Secondary | ICD-10-CM | POA: Diagnosis not present

## 2020-05-05 DIAGNOSIS — J9601 Acute respiratory failure with hypoxia: Secondary | ICD-10-CM | POA: Diagnosis not present

## 2020-05-10 ENCOUNTER — Telehealth: Payer: Self-pay | Admitting: Family Medicine

## 2020-05-10 ENCOUNTER — Ambulatory Visit (INDEPENDENT_AMBULATORY_CARE_PROVIDER_SITE_OTHER): Payer: Medicare PPO | Admitting: Family Medicine

## 2020-05-10 ENCOUNTER — Encounter: Payer: Self-pay | Admitting: Family Medicine

## 2020-05-10 ENCOUNTER — Other Ambulatory Visit: Payer: Self-pay

## 2020-05-10 VITALS — BP 108/78 | HR 78 | Temp 98.1°F | Resp 20 | Ht 62.0 in | Wt 186.8 lb

## 2020-05-10 DIAGNOSIS — E1165 Type 2 diabetes mellitus with hyperglycemia: Secondary | ICD-10-CM

## 2020-05-10 DIAGNOSIS — E1169 Type 2 diabetes mellitus with other specified complication: Secondary | ICD-10-CM

## 2020-05-10 DIAGNOSIS — E785 Hyperlipidemia, unspecified: Secondary | ICD-10-CM

## 2020-05-10 DIAGNOSIS — I2609 Other pulmonary embolism with acute cor pulmonale: Secondary | ICD-10-CM

## 2020-05-10 DIAGNOSIS — I10 Essential (primary) hypertension: Secondary | ICD-10-CM | POA: Diagnosis not present

## 2020-05-10 DIAGNOSIS — Z1159 Encounter for screening for other viral diseases: Secondary | ICD-10-CM | POA: Diagnosis not present

## 2020-05-10 DIAGNOSIS — R200 Anesthesia of skin: Secondary | ICD-10-CM

## 2020-05-10 LAB — COMPREHENSIVE METABOLIC PANEL
ALT: 19 U/L (ref 0–35)
AST: 20 U/L (ref 0–37)
Albumin: 4.6 g/dL (ref 3.5–5.2)
Alkaline Phosphatase: 78 U/L (ref 39–117)
BUN: 21 mg/dL (ref 6–23)
CO2: 31 mEq/L (ref 19–32)
Calcium: 10.3 mg/dL (ref 8.4–10.5)
Chloride: 103 mEq/L (ref 96–112)
Creatinine, Ser: 0.84 mg/dL (ref 0.40–1.20)
GFR: 66.46 mL/min (ref >60.00)
Glucose, Bld: 114 mg/dL — ABNORMAL HIGH (ref 70–99)
Potassium: 4.4 mEq/L (ref 3.5–5.1)
Sodium: 141 mEq/L (ref 135–145)
Total Bilirubin: 1.1 mg/dL (ref 0.2–1.2)
Total Protein: 7 g/dL (ref 6.0–8.3)

## 2020-05-10 LAB — LIPID PANEL
Cholesterol: 200 mg/dL (ref 0–200)
HDL: 116.8 mg/dL (ref >39.00)
LDL Cholesterol: 61 mg/dL (ref 0–99)
NonHDL: 82.92
Total CHOL/HDL Ratio: 2
Triglycerides: 109 mg/dL (ref 0.0–149.0)
VLDL: 21.8 mg/dL (ref 0.0–40.0)

## 2020-05-10 LAB — HEMOGLOBIN A1C: Hgb A1c MFr Bld: 6.6 % — ABNORMAL HIGH (ref 4.6–6.5)

## 2020-05-10 MED ORDER — OZEMPIC (0.25 OR 0.5 MG/DOSE) 2 MG/1.5ML ~~LOC~~ SOPN: 0.5000 mg | PEN_INJECTOR | SUBCUTANEOUS | 1 refills | Status: DC

## 2020-05-10 MED ORDER — NOVOFINE PLUS PEN NEEDLE 32G X 4 MM MISC: 1 refills | Status: AC

## 2020-05-10 NOTE — Progress Notes (Signed)
Patient ID: Sierra Ford, female    DOB: 1940/04/22  Age: 80 y.o. MRN: [id number]    Subjective:  Subjective  HPI Sierra Ford presents for f/u dm, chol and bp.  She also c/o numbness in both hands.  She wakes up with her hands feeling numb   HYPERTENSION   Blood pressure range-not checking   Chest pain- no      Dyspnea- no Lightheadedness- no   Edema- no  Other side effects - no   Medication compliance: good Low salt diet- yes    DIABETES    Blood Sugar ranges-not checking regularly  Polyuria- no New Visual problems- no  Hypoglycemic symptoms- no  Other side effects-no Medication compliance - good-- but she would like to change metformin to something else  Last eye exam- utd Foot exam- today   HYPERLIPIDEMIA  Medication compliance- good RUQ pain- no  Muscle aches- no Other side effects-no      Review of Systems  Constitutional: Negative for appetite change, diaphoresis, fatigue and unexpected weight change.  Eyes: Negative for pain, redness and visual disturbance.  Respiratory: Negative for cough, chest tightness, shortness of breath and wheezing.   Cardiovascular: Negative for chest pain, palpitations and leg swelling.  Endocrine: Negative for cold intolerance, heat intolerance, polydipsia, polyphagia and polyuria.  Genitourinary: Negative for difficulty urinating, dysuria and frequency.  Musculoskeletal: Positive for arthralgias.  Neurological: Positive for numbness. Negative for dizziness, light-headedness and headaches.    History Past Medical History:  Diagnosis Date  . Anxiety   . Back pain   . Chest pain    CLite with apical ischemia in 2006 - normal coronary arteries by [facility] in 12/2004;  Myoview 11/12:  Low risk stress nuclear study with a small, partially reversible apical defect most likely related to apical thinning; cannot R/O very mild apical ischemia.  EF: 75%   . Decreased hearing   . Depression   . Diabetes ([place])   . DVT (deep venous  thrombosis) ([place]) 2006   hx of  . Ectopic pregnancy with intrauterine pregnancy   . Hiatal hernia   . Hyperlipidemia   . PE (pulmonary embolism) 2006   hx of  . Sleep apnea    CPAP     She has a past surgical history that includes Foot surgery; Abdominal exploration surgery; Ectopic pregnancy surgery; Bunionectomy; Tubal ligation; and Colonoscopy (2009).   Her family history includes Alcoholism in her father; Colon polyps in her mother; Diabetes in her maternal aunt and maternal aunt; Heart failure in her mother; Hypertension in her mother; Obesity in her mother; Sudden death in her mother.She reports that she quit smoking about 10 years ago. Her smoking use included cigarettes. She has a 50.00 pack-year smoking history. She has never used smokeless tobacco. She reports current alcohol use. She reports that she does not use drugs.  Current Outpatient Medications on File Prior to Visit  Medication Sig Dispense Refill  . blood glucose meter kit and supplies KIT Dispense based on patient and insurance preference. Use up to four times daily as directed. (FOR ICD-9 250.00, 250.01). 1 each 0  . CALCIUM-VITAMIN D PO Take 600 Units by mouth 2 (two) times daily.    . celecoxib (CELEBREX) 100 MG capsule Take 1 capsule (100 mg total) by mouth 2 (two) times daily. 60 capsule 3  . cholecalciferol (VITAMIN D3) 10 MCG (400 UNIT) TABS tablet Take 400 Units by mouth 2 (two) times daily.    . Coenzyme Q10 100  MG TABS Take 100 mg by mouth daily.    Marland Kitchen ELIQUIS 5 MG TABS tablet Take 1 tablet by mouth twice daily 180 tablet 0  . Flaxseed, Linseed, (FLAXSEED OIL PO) Take 1,000 mg by mouth daily.     . furosemide (LASIX) 40 MG tablet Take 1 tablet (40 mg total) by mouth daily. 90 tablet 2  . Multiple Vitamin (MULTIVITAMIN) capsule Take 1 capsule by mouth daily.    . Omega-3 Fatty Acids (FISH OIL PO) Take 1,000 mg by mouth daily.    Marland Kitchen POTASSIUM CHLORIDE PO Take 1 tablet by mouth as needed (muscle cramps).     .  PRALUENT 75 MG/ML SOAJ INJECT 1 PEN INTO THE SKIN EVERY 14 DAYS 2 mL 11  . rosuvastatin (CRESTOR) 10 MG tablet Take 1 tablet (10 mg total) by mouth daily. 90 tablet 3  . TRUE METRIX BLOOD GLUCOSE TEST test strip TEST  UP  TO FOUR TIMES DAILY AS DIRECTED 200 each 12  . TRUEPLUS LANCETS 33G MISC TEST  UP TO FOUR TIMES DAILY AS DIRECTED 200 each 12  . Zoster Vaccine Adjuvanted [facility]) injection 0.6m IM x1  Repeat in 2-6 months 0.5 mL 1  . [DISCONTINUED] omega-3 acid ethyl esters (LOVAZA) 1 G capsule Take 1 g by mouth 3 (three) times daily.      . [DISCONTINUED] pravastatin (PRAVACHOL) 20 MG tablet Take 20 mg by mouth daily.       No current facility-administered medications on file prior to visit.     Objective:  Objective  Physical Exam Vitals and nursing note reviewed.  Constitutional:      Appearance: She is well-developed.  HENT:     Head: Normocephalic and atraumatic.  Eyes:     Conjunctiva/sclera: Conjunctivae normal.  Neck:     Thyroid: No thyromegaly.     Vascular: No carotid bruit or JVD.  Cardiovascular:     Rate and Rhythm: Normal rate and regular rhythm.     Heart sounds: Normal heart sounds. No murmur heard.   Pulmonary:     Effort: Pulmonary effort is normal. No respiratory distress.     Breath sounds: Normal breath sounds. No wheezing or rales.  Chest:     Chest wall: No tenderness.  Musculoskeletal:     Cervical back: Normal range of motion and neck supple.  Neurological:     Mental Status: She is alert and oriented to person, place, and time.     Comments: No numbness today Hands are only numb R>L in am when she first gets up     Diabetic Foot Exam - Simple   Simple Foot Form Diabetic Foot exam was performed with the following findings: Yes 05/10/2020 12:53 PM  Visual Inspection No deformities, no ulcerations, no other skin breakdown bilaterally: Yes Sensation Testing Intact to touch and monofilament testing bilaterally: Yes Pulse Check Posterior  Tibialis and Dorsalis pulse intact bilaterally: Yes Comments     BP 108/78 (BP Location: Right Arm, Patient Position: Sitting, Cuff Size: Large)   Pulse 78   Temp 98.1 F (36.7 C) (Oral)   Resp 20   Ht '5\' 2"'  (1.575 m)   Wt 186 lb 12.8 oz (84.7 kg)   SpO2 96%   BMI 34.17 kg/m  Wt Readings from Last 3 Encounters:  05/10/20 186 lb 12.8 oz (84.7 kg)  12/10/19 186 lb 9.6 oz (84.6 kg)  12/04/19 186 lb 9.6 oz (84.6 kg)     Lab Results  Component Value Date  WBC 5.3 01/31/2019   HGB 12.0 01/31/2019   HCT 36.2 01/31/2019   PLT 202 01/31/2019   GLUCOSE 112 (H) 11/10/2019   CHOL 218 (H) 11/10/2019   TRIG 68 11/10/2019   HDL 135 11/10/2019   LDLDIRECT 95.9 10/06/2012   LDLCALC 69 11/10/2019   ALT 14 11/10/2019   AST 16 11/10/2019   NA 137 11/10/2019   K 4.3 11/10/2019   CL 100 11/10/2019   CREATININE 0.82 11/10/2019   BUN 15 11/10/2019   CO2 29 11/10/2019   TSH 1.250 05/27/2017   INR 2.0 (H) 01/29/2019   HGBA1C 6.5 (H) 11/10/2019   MICROALBUR 0.6 11/10/2019    DG Chest 2 View  Result Date: 01/27/2019 CLINICAL DATA:  Acute shortness of breath EXAM: CHEST - 2 VIEW COMPARISON:  01/29/2018 FINDINGS: The cardiomediastinal silhouette is unremarkable. Minimal subsegmental atelectasis/scarring within the UPPER RIGHT lung and LOWER LEFT lung noted. There is no evidence of focal airspace disease, pulmonary edema, suspicious pulmonary nodule/mass, pleural effusion, or pneumothorax. No acute bony abnormalities are identified. IMPRESSION: No evidence of acute cardiopulmonary disease. Electronically Signed   By: Margarette Canada M.D.   On: 01/27/2019 12:34   CT Angio Chest PE W/Cm &/Or Wo Cm  Result Date: 01/27/2019 CLINICAL DATA:  Shortness of breath EXAM: CT ANGIOGRAPHY CHEST WITH CONTRAST TECHNIQUE: Multidetector CT imaging of the chest was performed using the standard protocol during bolus administration of intravenous contrast. Multiplanar CT image reconstructions and MIPs were obtained  to evaluate the vascular anatomy. CONTRAST:  168m OMNIPAQUE IOHEXOL 350 MG/ML SOLN COMPARISON:  None. FINDINGS: Cardiovascular: Satisfactory opacification of the pulmonary arteries. Filling defects reflecting acute pulmonary emboli are present within the pulmonary arteries beginning at the bifurcation of the main pulmonary artery with additional lobar, segmental, and subsegmental involvement bilaterally. There is enlargement of the right ventricle relative to the left with straightening of the interventricular septum. Mediastinum/Nodes: No adenopathy. Lungs/Pleura: Central airways are patent. Patchy ground-glass density likely related to altered perfusion and atelectasis. No pleural effusion or pneumothorax. Upper Abdomen: Small hiatal hernia. Musculoskeletal: Mild degenerative changes of the included spine. Review of the MIP images confirms the above findings. IMPRESSION: Positive for acute PE with CT evidence of right heart strain (RV/LV Ratio = 1.7) consistent with at least submassive (intermediate risk) PE. The presence of right heart strain has been associated with an increased risk of morbidity and mortality. Please activate Code PE by paging [phone] These results were called by telephone at the time of interpretation on 01/27/2019 at 5:01 pm to [facility], who verbally acknowledged these results. Electronically Signed   By: PMacy MisM.D.   On: 01/27/2019 17:08   ECHOCARDIOGRAM COMPLETE  Result Date: 01/28/2019   ECHOCARDIOGRAM REPORT   Patient Name:   BKASIYA BURCKDate of Exam: 01/28/2019 Medical Rec #:  [id number]       Height:       62.0 in Accession #:    [id number]      Weight:       193.0 lb Date of Birth:  7Jul 24, 1942       BSA:          1.88 m Patient Age:    89years         BP:           111/58 mmHg Patient Gender: F                HR:  89 bpm. Exam Location:  Inpatient Procedure: 2D Echo, Cardiac Doppler, Color Doppler and Intracardiac             Opacification Agent Indications:    Pulmonary embolus 415.19  History:        Patient has prior history of Echocardiogram examinations, most                 recent 04/16/2017. CHF, CAD, Signs/Symptoms:DOE; Risk                 Factors:Hypertension, Diabetes, Dyslipidemia and Former Smoker.                 Pulomary embolus.  Sonographer:    Paulita Fujita RDCS Referring Phys: [place]  1. Severely dilated RV with severely reduced function. Septum is flattened in systole/diastole consistent with RV pressure overload. RVSP is moderately elevated on this study. Findings consistent with submassive PE on review of medical record and CTA chest. Findings communicated to [name], MD @ 5:25 PM.  2. Left ventricular ejection fraction, by visual estimation, is 55 to 60%. The left ventricle has normal function. There is no left ventricular hypertrophy.  3. Definity contrast agent was given IV to delineate the left ventricular endocardial borders.  4. Right ventricular volume and pressure overload.  5. The left ventricle demonstrates regional wall motion abnormalities.  6. Global right ventricle has severely reduced systolic function.The right ventricular size is severely enlarged. No increase in right ventricular wall thickness.  7. Left atrial size was normal.  8. Right atrial size was mildly dilated.  9. Presence of pericardial fat pad. 10. The pericardial effusion is circumferential. 11. Trivial pericardial effusion is present. 12. Mild mitral annular calcification. 13. The mitral valve is degenerative. No evidence of mitral valve regurgitation. 14. The tricuspid valve is grossly normal. Tricuspid valve regurgitation is mild. 15. The aortic valve is tricuspid. Aortic valve regurgitation is not visualized. No evidence of aortic valve sclerosis or stenosis. 16. Pulmonic regurgitation is mild. 17. The pulmonic valve was grossly normal. Pulmonic valve regurgitation is mild. 18. Moderately elevated  pulmonary artery systolic pressure. 19. The tricuspid regurgitant velocity is 3.55 m/s, and with an assumed right atrial pressure of 8 mmHg, the estimated right ventricular systolic pressure is moderately elevated at 58.4 mmHg. 20. The inferior vena cava is normal in size with <50% respiratory variability, suggesting right atrial pressure of 8 mmHg. 21. Changes from prior study are noted. 22. A prior study was performed on 04/16/2017. 23. RV severely dilated and with reduced function compared with prior. FINDINGS  Left Ventricle: Left ventricular ejection fraction, by visual estimation, is 55 to 60%. The left ventricle has normal function. Definity contrast agent was given IV to delineate the left ventricular endocardial borders. The left ventricle demonstrates regional wall motion abnormalities. The left ventricular internal cavity size was the left ventricle is normal in size. There is no left ventricular hypertrophy. The interventricular septum is flattened in systole and diastole, consistent with right ventricular pressure and volume overload. The left ventricular diastology could not be evaluated due to abnormal septal motion. Right Ventricle: The right ventricular size is severely enlarged. No increase in right ventricular wall thickness. Global RV systolic function is has severely reduced systolic function. The tricuspid regurgitant velocity is 3.55 m/s, and with an assumed right atrial pressure of 8 mmHg, the estimated right ventricular systolic pressure is moderately elevated at 58.4 mmHg. Left Atrium: Left atrial size was normal in size. Right  Atrium: Right atrial size was mildly dilated Pericardium: Trivial pericardial effusion is present. The pericardial effusion is circumferential. Presence of pericardial fat pad. Mitral Valve: The mitral valve is degenerative in appearance. Mild mitral annular calcification. No evidence of mitral valve regurgitation. Tricuspid Valve: The tricuspid valve is grossly  normal. Tricuspid valve regurgitation is mild. Aortic Valve: The aortic valve is tricuspid. Aortic valve regurgitation is not visualized. The aortic valve is structurally normal, with no evidence of sclerosis or stenosis. Pulmonic Valve: The pulmonic valve was grossly normal. Pulmonic valve regurgitation is mild. Pulmonic regurgitation is mild. Aorta: The aortic root is normal in size and structure. Venous: The inferior vena cava is normal in size with less than 50% respiratory variability, suggesting right atrial pressure of 8 mmHg. IAS/Shunts: No atrial level shunt detected by color flow Doppler. Additional Comments: A prior study was performed on 04/16/2017.  LEFT VENTRICLE PLAX 2D LVIDd:         3.85 cm       Diastology LVIDs:         2.56 cm       LV e' lateral:   4.35 cm/s LV PW:         0.69 cm       LV E/e' lateral: 8.3 LV IVS:        0.67 cm       LV e' medial:    3.59 cm/s LVOT diam:     1.80 cm       LV E/e' medial:  10.0 LV SV:         40 ml LV SV Index:   20.14 LVOT Area:     2.54 cm  LV Volumes (MOD) LV area d, A4C:    20.60 cm LV area s, A4C:    15.00 cm LV major d, A4C:   6.93 cm LV major s, A4C:   6.15 cm LV vol d, MOD A4C: 51.3 ml LV vol s, MOD A4C: 30.9 ml LV SV MOD A4C:     51.3 ml RIGHT VENTRICLE RV S prime:     7.72 cm/s TAPSE (M-mode): 1.2 cm LEFT ATRIUM             Index       RIGHT ATRIUM          Index LA diam:        3.20 cm 1.70 cm/m  RA Area:     8.89 cm LA Vol (A2C):   24.6 ml 13.06 ml/m RA Volume:   17.20 ml 9.13 ml/m LA Vol (A4C):   18.4 ml 9.77 ml/m LA Biplane Vol: 21.9 ml 11.63 ml/m  AORTIC VALVE LVOT Vmax:   76.90 cm/s LVOT Vmean:  53.800 cm/s LVOT VTI:    0.123 m  AORTA Ao Root diam: 2.50 cm MITRAL VALVE                        TRICUSPID VALVE MV Area (PHT): 3.02 cm             TR Peak grad:   50.4 mmHg MV PHT:        72.79 msec           TR Vmax:        355.00 cm/s MV Decel Time: 251 msec MV E velocity: 35.90 cm/s 103 cm/s  SHUNTS MV A velocity: 60.60 cm/s 70.3 cm/s  Systemic VTI:  0.12 m MV E/A ratio:  0.59  1.5       Systemic Diam: 1.80 cm  Eleonore Chiquito MD Electronically signed by Eleonore Chiquito MD Signature Date/Time: 01/28/2019/5:27:44 PM    Final    VAS [name] LOWER EXTREMITY VENOUS (DVT)  Result Date: 01/28/2019  Lower Venous Study Indications: Pulmonary embolism.  Limitations: Poor ultrasound/tissue interface, body habitus and patient positioning. Performing Technologist: Oliver Hum RVT  Examination Guidelines: A complete evaluation includes B-mode imaging, spectral Doppler, color Doppler, and power Doppler as needed of all accessible portions of each vessel. Bilateral testing is considered an integral part of a complete examination. Limited examinations for reoccurring indications may be performed as noted.  +---------+---------------+---------+-----------+----------+--------------+ RIGHT    CompressibilityPhasicitySpontaneityPropertiesThrombus Aging +---------+---------------+---------+-----------+----------+--------------+ CFV      Full           Yes      Yes                                 +---------+---------------+---------+-----------+----------+--------------+ SFJ      Full                                                        +---------+---------------+---------+-----------+----------+--------------+ FV Prox  Partial        No       No                   Acute          +---------+---------------+---------+-----------+----------+--------------+ FV Mid   Partial        No       No                   Acute          +---------+---------------+---------+-----------+----------+--------------+ FV DistalPartial        No       No                   Acute          +---------+---------------+---------+-----------+----------+--------------+ PFV      Full                                                        +---------+---------------+---------+-----------+----------+--------------+ POP      None           No        No                   Acute          +---------+---------------+---------+-----------+----------+--------------+ PTV      Partial                                      Acute          +---------+---------------+---------+-----------+----------+--------------+ PERO     None  Acute          +---------+---------------+---------+-----------+----------+--------------+ Gastroc  None                                         Acute          +---------+---------------+---------+-----------+----------+--------------+   +---------+---------------+---------+-----------+----------+--------------+ LEFT     CompressibilityPhasicitySpontaneityPropertiesThrombus Aging +---------+---------------+---------+-----------+----------+--------------+ CFV      Full           Yes      Yes                                 +---------+---------------+---------+-----------+----------+--------------+ SFJ      Full                                                        +---------+---------------+---------+-----------+----------+--------------+ FV Prox  Full                                                        +---------+---------------+---------+-----------+----------+--------------+ FV Mid   Full                                                        +---------+---------------+---------+-----------+----------+--------------+ FV DistalFull                                                        +---------+---------------+---------+-----------+----------+--------------+ PFV      Full                                                        +---------+---------------+---------+-----------+----------+--------------+ POP      Full           Yes      Yes                                 +---------+---------------+---------+-----------+----------+--------------+ PTV      Full                                                         +---------+---------------+---------+-----------+----------+--------------+ PERO     Full                                                        +---------+---------------+---------+-----------+----------+--------------+  Summary: Right: Findings consistent with acute deep vein thrombosis involving the right femoral vein, right popliteal vein, right posterior tibial veins, right peroneal veins, and right gastrocnemius veins. No cystic structure found in the popliteal fossa. Left: There is no evidence of deep vein thrombosis in the lower extremity. However, portions of this examination were limited- see technologist comments above. No cystic structure found in the popliteal fossa.  *See table(s) above for measurements and observations. Electronically signed by Curt Jews MD on 01/28/2019 at 8:48:50 PM.    Final      Assessment & Plan:  Plan  I have discontinued Wei B. Korman's metFORMIN. I am also having her start on Ozempic (0.25 or 0.5 MG/DOSE) and NovoFine Plus Pen Needle. Additionally, I am having her maintain her cholecalciferol, CALCIUM-VITAMIN D PO, Coenzyme Q10, (Flaxseed, Linseed, (FLAXSEED OIL PO)), Omega-3 Fatty Acids (FISH OIL PO), blood glucose meter kit and supplies, POTASSIUM CHLORIDE PO, True Metrix Blood Glucose Test, TRUEplus Lancets 33G, Shingrix, multivitamin, rosuvastatin, furosemide, Eliquis, Praluent, and celecoxib.  Meds ordered this encounter  Medications  . Semaglutide,0.25 or 0.5MG/DOS, (OZEMPIC, 0.25 OR 0.5 MG/DOSE,) 2 MG/1.5ML SOPN    Sig: Inject 0.5 mg into the skin once a week.    Dispense:  4.5 mL    Refill:  1  . 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 Encouraged heart  healthy diet such as the DASH diet and exercise as tolerated.       Hyperlipidemia associated with type 2 diabetes mellitus ([place])    Encouraged heart healthy diet, increase exercise, avoid trans fats, consider a krill oil cap daily Pt is on praulent       Relevant Medications   Semaglutide,0.25 or 0.5MG/DOS, (OZEMPIC, 0.25 OR 0.5 MG/DOSE,) 2 MG/1.5ML SOPN   Other Relevant Orders   Lipid panel   Comprehensive metabolic panel   Hyperlipidemia LDL goal <70    Tolerating statin, encouraged heart healthy diet, avoid trans fats, minimize simple carbs and saturated fats. Increase exercise as tolerated       Other Visit Diagnoses    Uncontrolled type 2 diabetes mellitus with hyperglycemia (HCC)    -  Primary   Relevant Medications   Semaglutide,0.25 or 0.5MG/DOS, (OZEMPIC, 0.25 OR 0.5 MG/DOSE,) 2 MG/1.5ML SOPN   Insulin Pen Needle (NOVOFINE PLUS PEN NEEDLE) 32G X 4 MM MISC   Other Relevant Orders   Lipid panel   Hemoglobin A1c   Comprehensive metabolic panel   Primary hypertension       Relevant Orders   Lipid panel   Hemoglobin A1c   Comprehensive metabolic panel   Numbness in both hands       Relevant Orders   Ambulatory referral to Orthopedic Surgery   Need for hepatitis C screening test       Relevant Orders   Hepatitis C antibody      Follow-up: Return in about 3 months (around 08/10/2020), or if symptoms worsen or fail to improve, for fasting, hypertension, diabetes II.  Ann Held, DO

## 2020-05-10 NOTE — Patient Instructions (Signed)
Carpal Tunnel Syndrome  Carpal tunnel syndrome is a condition that causes pain, numbness, and weakness in your hand and fingers. The carpal tunnel is a narrow area located on the palm side of your wrist. Repeated wrist motion or certain diseases may cause swelling within the tunnel. This swelling pinches the main nerve in the wrist. The main nerve in the wrist is called the median nerve. What are the causes? This condition may be caused by:  Repeated and forceful wrist and hand motions.  Wrist injuries.  Arthritis.  A cyst or tumor in the carpal tunnel.  Fluid buildup during pregnancy.  Use of tools that vibrate. Sometimes the cause of this condition is not known. What increases the risk? The following factors may make you more likely to develop this condition:  Having a job that requires you to repeatedly or forcefully move your wrist or hand or requires you to use tools that vibrate. This may include jobs that involve using computers, working on an [employer], or working with [place] such as Pension scheme manager.  Being a woman.  Having certain conditions, such as: ? Diabetes. ? Obesity. ? An underactive thyroid (hypothyroidism). ? Kidney failure. ? Rheumatoid arthritis. What are the signs or symptoms? Symptoms of this condition include:  A tingling feeling in your fingers, especially in your thumb, index, and middle fingers.  Tingling or numbness in your hand.  An aching feeling in your entire arm, especially when your wrist and elbow are bent for a long time.  Wrist pain that goes up your arm to your shoulder.  Pain that goes down into your palm or fingers.  A weak feeling in your hands. You may have trouble grabbing and holding items. Your symptoms may feel worse during the night. How is this diagnosed? This condition is diagnosed with a medical history and physical exam. You may also have tests, including:  Electromyogram (EMG). This test measures electrical  signals sent by your nerves into the muscles.  Nerve conduction study. This test measures how well electrical signals pass through your nerves.  Imaging tests, such as X-rays, ultrasound, and MRI. These tests check for possible causes of your condition. How is this treated? This condition may be treated with:  Lifestyle changes. It is important to stop or change the activity that caused your condition.  Doing exercise and activities to strengthen and stretch your muscles and tendons (physical therapy).  Making lifestyle changes to help with your condition and learning how to do your daily activities safely (occupational therapy).  Medicines for pain and inflammation. This may include medicine that is injected into your wrist.  A wrist splint or brace.  Surgery. Follow these instructions at home: If you have a splint or brace:  Wear the splint or brace as told by your health care provider. Remove it only as told by your health care provider.  Loosen the splint or brace if your fingers tingle, become numb, or turn cold and blue.  Keep the splint or brace clean.  If the splint or brace is not waterproof: ? Do not let it get wet. ? Cover it with a watertight covering when you take a bath or shower. Managing pain, stiffness, and swelling If directed, put ice on the painful area. To do this:  If you have a removeable splint or brace, remove it as told by your health care provider.  Put ice in a plastic bag.  Place a towel between your skin and the bag  or between the splint or brace and the bag.  Leave the ice on for 20 minutes, 2-3 times a day. Do not fall asleep with the cold pack on your skin.  Remove the ice if your skin turns bright red. This is very important. If you cannot feel pain, heat, or cold, you have a greater risk of damage to the area. Move your fingers often to reduce stiffness and swelling.   General instructions  Take over-the-counter and prescription  medicines only as told by your health care provider.  Rest your wrist and hand from any activity that may be causing your pain. If your condition is work related, talk with your employer about changes that can be made, such as getting a wrist pad to use while typing.  Do any exercises as told by your health care provider, physical therapist, or occupational therapist.  Keep all follow-up visits. This is important. Contact a health care provider if:  You have new symptoms.  Your pain is not controlled with medicines.  Your symptoms get worse. Get help right away if:  You have severe numbness or tingling in your wrist or hand. Summary  Carpal tunnel syndrome is a condition that causes pain, numbness, and weakness in your hand and fingers.  It is usually caused by repeated wrist motions.  Lifestyle changes and medicines are used to treat carpal tunnel syndrome. Surgery may be recommended.  Follow your health care provider's instructions about wearing a splint, resting from activity, keeping follow-up visits, and calling for help. This information is not intended to replace advice given to you by your health care provider. Make sure you discuss any questions you have with your health care provider. Document Revised: 06/18/2019 Document Reviewed: 06/18/2019 Elsevier Patient Education  [place].

## 2020-05-10 NOTE — Assessment & Plan Note (Signed)
Tolerating statin, encouraged heart healthy diet, avoid trans fats, minimize simple carbs and saturated fats. Increase exercise as tolerated 

## 2020-05-10 NOTE — Assessment & Plan Note (Addendum)
Encouraged heart healthy diet, increase exercise, avoid trans fats, consider a krill oil cap daily Pt is on praulent

## 2020-05-10 NOTE — Assessment & Plan Note (Signed)
On eliquis

## 2020-05-10 NOTE — Assessment & Plan Note (Signed)
Well controlled, no changes to meds. Encouraged heart healthy diet such as the DASH diet and exercise as tolerated.  °

## 2020-05-10 NOTE — Telephone Encounter (Signed)
Patient was told by the pharm that her meds was denied for the ozempic. Pharma was suppose to call us.

## 2020-05-11 LAB — HEPATITIS C ANTIBODY
Hepatitis C Ab: NONREACTIVE
SIGNAL TO CUT-OFF: 0.01 (ref <1.00)

## 2020-05-11 NOTE — Telephone Encounter (Signed)
Ozempic refilled yesterday, 05/10/20.

## 2020-05-17 ENCOUNTER — Telehealth: Payer: Self-pay

## 2020-05-17 NOTE — Telephone Encounter (Signed)
Received [employer] form from pt via [employer] for completion.  Form placed in provider's box for pick up and completion.

## 2020-05-18 NOTE — Telephone Encounter (Signed)
Received

## 2020-05-19 NOTE — Telephone Encounter (Signed)
Forms have been faxed 

## 2020-05-19 NOTE — Telephone Encounter (Signed)
done

## 2020-05-19 NOTE — Telephone Encounter (Signed)
Forms partially completed. Forms given to [facility] to complete and sign

## 2020-05-20 DIAGNOSIS — G4733 Obstructive sleep apnea (adult) (pediatric): Secondary | ICD-10-CM | POA: Diagnosis not present

## 2020-05-26 ENCOUNTER — Other Ambulatory Visit: Payer: Self-pay | Admitting: Cardiology

## 2020-05-26 DIAGNOSIS — R600 Localized edema: Secondary | ICD-10-CM

## 2020-05-26 DIAGNOSIS — I10 Essential (primary) hypertension: Secondary | ICD-10-CM

## 2020-06-05 DIAGNOSIS — R0600 Dyspnea, unspecified: Secondary | ICD-10-CM | POA: Diagnosis not present

## 2020-06-05 DIAGNOSIS — I5032 Chronic diastolic (congestive) heart failure: Secondary | ICD-10-CM | POA: Diagnosis not present

## 2020-06-05 DIAGNOSIS — I2699 Other pulmonary embolism without acute cor pulmonale: Secondary | ICD-10-CM | POA: Diagnosis not present

## 2020-06-05 DIAGNOSIS — G4733 Obstructive sleep apnea (adult) (pediatric): Secondary | ICD-10-CM | POA: Diagnosis not present

## 2020-06-05 DIAGNOSIS — J9601 Acute respiratory failure with hypoxia: Secondary | ICD-10-CM | POA: Diagnosis not present

## 2020-06-05 DIAGNOSIS — I2609 Other pulmonary embolism with acute cor pulmonale: Secondary | ICD-10-CM | POA: Diagnosis not present

## 2020-06-07 ENCOUNTER — Other Ambulatory Visit (HOSPITAL_BASED_OUTPATIENT_CLINIC_OR_DEPARTMENT_OTHER): Payer: Self-pay

## 2020-06-08 ENCOUNTER — Ambulatory Visit: Payer: 59 | Attending: Internal Medicine

## 2020-06-08 ENCOUNTER — Other Ambulatory Visit (HOSPITAL_BASED_OUTPATIENT_CLINIC_OR_DEPARTMENT_OTHER): Payer: Self-pay

## 2020-06-08 ENCOUNTER — Other Ambulatory Visit: Payer: Self-pay

## 2020-06-08 DIAGNOSIS — Z23 Encounter for immunization: Secondary | ICD-10-CM

## 2020-06-08 MED ORDER — PFIZER-BIONT COVID-19 VAC-TRIS 30 MCG/0.3ML IM SUSP
INTRAMUSCULAR | 0 refills | Status: DC
  Filled 2020-06-08: qty 0.3, 1d supply, fill #0

## 2020-06-08 NOTE — Progress Notes (Signed)
   Covid-19 Vaccination Clinic  Name:  Sierra Ford    MRN: [id number] DOB: 1940-05-18  06/08/2020  Sierra Ford was observed post Covid-19 immunization for 30 minutes based on pre-vaccination screening without incident. She was provided with Vaccine Information Sheet and instruction to access the V-Safe system.   Sierra Ford was instructed to call 911 with any severe reactions post vaccine: Marland Kitchen Difficulty breathing  . Swelling of face and throat  . A fast heartbeat  . A bad rash all over body  . Dizziness and weakness   Immunizations Administered    Name Date Dose VIS Date Route   PFIZER Comrnaty(Gray TOP) Covid-19 Vaccine 06/08/2020 10:01 AM 0.3 mL 01/28/2020 Intramuscular   Manufacturer: [employer], [employer]   Lot: [id number]   NDC: [phone]

## 2020-06-09 DIAGNOSIS — G5603 Carpal tunnel syndrome, bilateral upper limbs: Secondary | ICD-10-CM | POA: Diagnosis not present

## 2020-06-13 DIAGNOSIS — G5603 Carpal tunnel syndrome, bilateral upper limbs: Secondary | ICD-10-CM | POA: Diagnosis not present

## 2020-06-15 ENCOUNTER — Telehealth: Payer: Self-pay | Admitting: *Deleted

## 2020-06-15 ENCOUNTER — Telehealth: Payer: Self-pay | Admitting: Family Medicine

## 2020-06-15 MED ORDER — OZEMPIC (0.25 OR 0.5 MG/DOSE) 2 MG/1.5ML ~~LOC~~ SOPN: 0.5000 mg | PEN_INJECTOR | SUBCUTANEOUS | 0 refills | Status: DC

## 2020-06-15 NOTE — Telephone Encounter (Signed)
Patient called about ozempic to see if we have received it from [facility].  We have not received it.  I called [employer] and they stated that her order was processed on 06/08/20 and that we should receive it in about 10-14 days (around week of 06/27/20).  Patient is completely out and need a sample.  Sample ok to give per Dr. Nani Ravens.  Sample place in fridge for pickup.

## 2020-06-15 NOTE — Telephone Encounter (Signed)
Okay to provide samples if available.  To be taken the same way it was prescribed by PCP

## 2020-06-15 NOTE — Telephone Encounter (Signed)
Sierra Ford made the pt aware that the samples are ready,

## 2020-06-15 NOTE — Telephone Encounter (Signed)
Okay to give pt samples of Ozempic?

## 2020-06-15 NOTE — Telephone Encounter (Signed)
Per [place]:   Patient called about ozempic to see if we have received it from [facility].  We have not received it.  I called [employer] and they stated that her order was processed on 06/08/20 and that we should receive it in about 10-14 days (around week of 06/27/20).   Patient is completely out and need a sample.  Sample ok to give per Dr. Nani Ravens.  Sample place in fridge for pickup.

## 2020-06-15 NOTE — Telephone Encounter (Signed)
Patient is expecting a shipment of ozempic arriving to our office at any time now. Patient is currently out of medication and requesting samples.   Please advise

## 2020-06-20 ENCOUNTER — Telehealth: Payer: Self-pay

## 2020-06-20 NOTE — Telephone Encounter (Signed)
Called and left a vm on her cell and home numbers for pt to let her know that her patient assistance (Ozempic) is here in the refrigerator.

## 2020-06-23 DIAGNOSIS — G5602 Carpal tunnel syndrome, left upper limb: Secondary | ICD-10-CM | POA: Diagnosis not present

## 2020-06-23 DIAGNOSIS — G5603 Carpal tunnel syndrome, bilateral upper limbs: Secondary | ICD-10-CM | POA: Diagnosis not present

## 2020-06-24 DIAGNOSIS — M25511 Pain in right shoulder: Secondary | ICD-10-CM | POA: Diagnosis not present

## 2020-06-30 ENCOUNTER — Telehealth: Payer: Self-pay | Admitting: Cardiology

## 2020-06-30 NOTE — Telephone Encounter (Signed)
  Pt c/o medication issue:  1. Name of Medication: PRALUENT 75 MG/ML SOAJ  2. How are you currently taking this medication (dosage and times per day)? As directed  3. Are you having a reaction (difficulty breathing--STAT)?  NA  4. What is your medication issue? Patient would like to speak to [facility] because when she went to pick up the medication they wanted her to pay because her voucher in no longer valid. Please call.

## 2020-06-30 NOTE — Telephone Encounter (Signed)
Pt enrolled in [place] which expires on 07/30/20, however she has already used the full $2500 funding from it. Will not be able to renew grant until after 6/11, pt will need to pay for copay this month until then. She states she cannot afford as it's over $100. Advised her that [employer] and [employer] do not have any Praluent samples, she will check with her PCP.  Will call pt on 6/11 when we can renew [employer] grant.

## 2020-07-04 NOTE — Telephone Encounter (Signed)
Called and spoke w/pt regarding the approval of the healthwell foundation for the cholesterol medications and the pt voiced gratitude and understanding

## 2020-07-04 NOTE — Telephone Encounter (Signed)
Called and lmomed the pt to call us so we can help w/grant assistance

## 2020-07-05 DIAGNOSIS — G4733 Obstructive sleep apnea (adult) (pediatric): Secondary | ICD-10-CM | POA: Diagnosis not present

## 2020-07-05 DIAGNOSIS — R0600 Dyspnea, unspecified: Secondary | ICD-10-CM | POA: Diagnosis not present

## 2020-07-05 DIAGNOSIS — I2699 Other pulmonary embolism without acute cor pulmonale: Secondary | ICD-10-CM | POA: Diagnosis not present

## 2020-07-05 DIAGNOSIS — J9601 Acute respiratory failure with hypoxia: Secondary | ICD-10-CM | POA: Diagnosis not present

## 2020-07-05 DIAGNOSIS — I2609 Other pulmonary embolism with acute cor pulmonale: Secondary | ICD-10-CM | POA: Diagnosis not present

## 2020-07-05 DIAGNOSIS — I5032 Chronic diastolic (congestive) heart failure: Secondary | ICD-10-CM | POA: Diagnosis not present

## 2020-07-05 NOTE — Telephone Encounter (Signed)
Returned a call to pt and stated that the grant doesn't start until 07/31/20. And that we are out of samples pt voiced understanding and gratitude for our assistance

## 2020-07-07 ENCOUNTER — Telehealth: Payer: Self-pay | Admitting: Family Medicine

## 2020-07-07 NOTE — Telephone Encounter (Signed)
PRALUENT 75 MG/ML SOAJ  Patient calling about possibly getting a sample because her grant has ran out & she will not have another one until June. Pharmacy is wanting for her to pay for the refill she has avaia  Please advise

## 2020-07-07 NOTE — Telephone Encounter (Signed)
Erasmo Downer,   Any way to help with this?  Thanks.

## 2020-07-08 NOTE — Telephone Encounter (Signed)
We don't have Praluent samples currently. Waiting for next shipment to get here.  Please contact patient to troubleshoot issues with her grant.

## 2020-07-11 NOTE — Telephone Encounter (Signed)
Called and lmomed the pt stating that we currently do not have samples but she should call once a week to check the status as her grant doesn't go into effect until 08/04/20

## 2020-07-15 DIAGNOSIS — Z1231 Encounter for screening mammogram for malignant neoplasm of breast: Secondary | ICD-10-CM | POA: Diagnosis not present

## 2020-07-15 DIAGNOSIS — Z01419 Encounter for gynecological examination (general) (routine) without abnormal findings: Secondary | ICD-10-CM | POA: Diagnosis not present

## 2020-07-15 DIAGNOSIS — Z6834 Body mass index (BMI) 34.0-34.9, adult: Secondary | ICD-10-CM | POA: Diagnosis not present

## 2020-07-15 LAB — HM MAMMOGRAPHY

## 2020-07-25 ENCOUNTER — Telehealth: Payer: Self-pay

## 2020-07-25 ENCOUNTER — Encounter: Payer: Self-pay | Admitting: Cardiology

## 2020-07-25 NOTE — Telephone Encounter (Signed)
error 

## 2020-07-25 NOTE — Telephone Encounter (Signed)
Patient has questions about paperwork..  Please call.

## 2020-07-25 NOTE — Telephone Encounter (Signed)
Called the pt back and stated that they are already approved for healthwell but that it starts 6/12 and we can give one sample pt voiced understanding

## 2020-08-05 DIAGNOSIS — R0600 Dyspnea, unspecified: Secondary | ICD-10-CM | POA: Diagnosis not present

## 2020-08-05 DIAGNOSIS — I2609 Other pulmonary embolism with acute cor pulmonale: Secondary | ICD-10-CM | POA: Diagnosis not present

## 2020-08-05 DIAGNOSIS — I5032 Chronic diastolic (congestive) heart failure: Secondary | ICD-10-CM | POA: Diagnosis not present

## 2020-08-05 DIAGNOSIS — G4733 Obstructive sleep apnea (adult) (pediatric): Secondary | ICD-10-CM | POA: Diagnosis not present

## 2020-08-05 DIAGNOSIS — I2699 Other pulmonary embolism without acute cor pulmonale: Secondary | ICD-10-CM | POA: Diagnosis not present

## 2020-08-05 DIAGNOSIS — J9601 Acute respiratory failure with hypoxia: Secondary | ICD-10-CM | POA: Diagnosis not present

## 2020-08-11 ENCOUNTER — Encounter: Payer: Self-pay | Admitting: Family Medicine

## 2020-08-11 ENCOUNTER — Other Ambulatory Visit: Payer: Self-pay

## 2020-08-11 ENCOUNTER — Ambulatory Visit (INDEPENDENT_AMBULATORY_CARE_PROVIDER_SITE_OTHER): Payer: Medicare PPO | Admitting: Family Medicine

## 2020-08-11 VITALS — BP 130/72 | HR 82 | Temp 98.3°F | Resp 18 | Ht 62.0 in | Wt 190.0 lb

## 2020-08-11 DIAGNOSIS — H6123 Impacted cerumen, bilateral: Secondary | ICD-10-CM

## 2020-08-11 DIAGNOSIS — IMO0002 Reserved for concepts with insufficient information to code with codable children: Secondary | ICD-10-CM

## 2020-08-11 DIAGNOSIS — I10 Essential (primary) hypertension: Secondary | ICD-10-CM

## 2020-08-11 DIAGNOSIS — E1169 Type 2 diabetes mellitus with other specified complication: Secondary | ICD-10-CM

## 2020-08-11 DIAGNOSIS — E1151 Type 2 diabetes mellitus with diabetic peripheral angiopathy without gangrene: Secondary | ICD-10-CM

## 2020-08-11 DIAGNOSIS — E1165 Type 2 diabetes mellitus with hyperglycemia: Secondary | ICD-10-CM

## 2020-08-11 DIAGNOSIS — E785 Hyperlipidemia, unspecified: Secondary | ICD-10-CM

## 2020-08-11 LAB — MICROALBUMIN / CREATININE URINE RATIO
Creatinine,U: 84.9 mg/dL
Microalb Creat Ratio: 0.8 mg/g (ref 0.0–30.0)
Microalb, Ur: <0.7 mg/dL (ref 0.0–1.9)

## 2020-08-11 LAB — COMPREHENSIVE METABOLIC PANEL
ALT: 19 U/L (ref 0–35)
AST: 20 U/L (ref 0–37)
Albumin: 4.6 g/dL (ref 3.5–5.2)
Alkaline Phosphatase: 76 U/L (ref 39–117)
BUN: 17 mg/dL (ref 6–23)
CO2: 31 mEq/L (ref 19–32)
Calcium: 10.9 mg/dL — ABNORMAL HIGH (ref 8.4–10.5)
Chloride: 99 mEq/L (ref 96–112)
Creatinine, Ser: 0.83 mg/dL (ref 0.40–1.20)
GFR: 67.3 mL/min (ref >60.00)
Glucose, Bld: 93 mg/dL (ref 70–99)
Potassium: 4.7 mEq/L (ref 3.5–5.1)
Sodium: 139 mEq/L (ref 135–145)
Total Bilirubin: 1.3 mg/dL — ABNORMAL HIGH (ref 0.2–1.2)
Total Protein: 7 g/dL (ref 6.0–8.3)

## 2020-08-11 LAB — LIPID PANEL
Cholesterol: 201 mg/dL — ABNORMAL HIGH (ref 0–200)
HDL: 120.5 mg/dL (ref >39.00)
LDL Cholesterol: 66 mg/dL (ref 0–99)
NonHDL: 80.74
Total CHOL/HDL Ratio: 2
Triglycerides: 74 mg/dL (ref 0.0–149.0)
VLDL: 14.8 mg/dL (ref 0.0–40.0)

## 2020-08-11 LAB — HEMOGLOBIN A1C: Hgb A1c MFr Bld: 6.7 % — ABNORMAL HIGH (ref 4.6–6.5)

## 2020-08-11 NOTE — Patient Instructions (Signed)
Earwax Buildup, Adult ?The ears produce a substance called earwax that helps keep bacteria out of the ear and protects the skin in the ear canal. Occasionally, earwax can build up in the ear and cause discomfort or hearing loss. ?What are the causes? ?This condition is caused by a buildup of earwax. Ear canals are self-cleaning. Ear wax is made in the outer part of the ear canal and generally falls out in small amounts over time. ?When the self-cleaning mechanism is not working, earwax builds up and can cause decreased hearing and discomfort. Attempting to clean ears with cotton swabs can push the earwax deep into the ear canal and cause decreased hearing and pain. ?What increases the risk? ?This condition is more likely to develop in people who: ?Clean their ears often with cotton swabs. ?Pick at their ears. ?Use earplugs or in-ear headphones often, or wear hearing aids. ?The following factors may also make you more likely to develop this condition: ?Being female. ?Being of older age. ?Naturally producing more earwax. ?Having narrow ear canals. ?Having earwax that is overly thick or sticky. ?Having excess hair in the ear canal. ?Having eczema. ?Being dehydrated. ?What are the signs or symptoms? ?Symptoms of this condition include: ?Reduced or muffled hearing. ?A feeling of fullness in the ear or feeling that the ear is plugged. ?Fluid coming from the ear. ?Ear pain or an itchy ear. ?Ringing in the ear. ?Coughing. ?Balance problems. ?An obvious piece of earwax that can be seen inside the ear canal. ?How is this diagnosed? ?This condition may be diagnosed based on: ?Your symptoms. ?Your medical history. ?An ear exam. During the exam, your health care provider will look into your ear with an instrument called an otoscope. ?You may have tests, including a hearing test. ?How is this treated? ?This condition may be treated by: ?Using ear drops to soften the earwax. ?Having the earwax removed by a health care provider. The  health care provider may: ?Flush the ear with water. ?Use an instrument that has a loop on the end (curette). ?Use a suction device. ?Having surgery to remove the wax buildup. This may be done in severe cases. ?Follow these instructions at home: ? ?Take over-the-counter and prescription medicines only as told by your health care provider. ?Do not put any objects, including cotton swabs, into your ear. You can clean the opening of your ear canal with a washcloth or facial tissue. ?Follow instructions from your health care provider about cleaning your ears. Do not overclean your ears. ?Drink enough fluid to keep your urine pale yellow. This will help to thin the earwax. ?Keep all follow-up visits as told. If earwax builds up in your ears often or if you use hearing aids, consider seeing your health care provider for routine, preventive ear cleanings. Ask your health care provider how often you should schedule your cleanings. ?If you have hearing aids, clean them according to instructions from the manufacturer and your health care provider. ?Contact a health care provider if: ?You have ear pain. ?You develop a fever. ?You have pus or other fluid coming from your ear. ?You have hearing loss. ?You have ringing in your ears that does not go away. ?You feel like the room is spinning (vertigo). ?Your symptoms do not improve with treatment. ?Get help right away if: ?You have bleeding from the affected ear. ?You have severe ear pain. ?Summary ?Earwax can build up in the ear and cause discomfort or hearing loss. ?The most common symptoms of this condition include   reduced or muffled hearing, a feeling of fullness in the ear, or feeling that the ear is plugged. ?This condition may be diagnosed based on your symptoms, your medical history, and an ear exam. ?This condition may be treated by using ear drops to soften the earwax or by having the earwax removed by a health care provider. ?Do not put any objects, including cotton  swabs, into your ear. You can clean the opening of your ear canal with a washcloth or facial tissue. ?This information is not intended to replace advice given to you by your health care provider. Make sure you discuss any questions you have with your health care provider. ?Document Revised: 05/26/2019 Document Reviewed: 05/26/2019 ?Elsevier Patient Education ? 2022 Elsevier Inc. ? ?

## 2020-08-11 NOTE — Assessment & Plan Note (Signed)
Irrigated successfully

## 2020-08-11 NOTE — Progress Notes (Addendum)
Subjective:   By signing my name below, I, Sierra Ford, attest that this documentation has been prepared under the direction and in the presence of Dr. Roma Schanz, DO. 08/11/2020     Patient ID: Sierra Ford, female    DOB: 07-Aug-1940, 80 y.o.   MRN: [id number]  Chief Complaint  Patient presents with  . 3 month follow up    Concerns/ questions: ear wax removal for hearing test  . Hyperlipidemia    Praulent injections, Crestor 10 mg, Fish Oil.  . Diabetes    On Ozempic 0.5 mg weekly.    Hyperlipidemia Pertinent negatives include no chest pain or shortness of breath.  Diabetes Pertinent negatives for hypoglycemia include no dizziness, headaches or nervousness/anxiousness. Pertinent negatives for diabetes include no blurred vision and no chest pain.  Patient is in today for a office visit. She is requesting to have her ears flushed. She reports that she has excess ear wax. She does not use q-tips or stick her fingers in her ears.  She also complains of numbness in her left 3, 4, and 5th toes that comes and goes. She has an upcoming appointment with a provider to manage the carpal tunnel in her hand. She is willing to set up an appointment with her podiatrist to manage her pain.   Health Maintenance Due  Topic Date Due  . MAMMOGRAM  03/25/2019    Past Medical History:  Diagnosis Date  . Anxiety   . Back pain   . Chest pain    CLite with apical ischemia in 2006 - normal coronary arteries by [facility] in 12/2004;  Myoview 11/12:  Low risk stress nuclear study with a small, partially reversible apical defect most likely related to apical thinning; cannot R/O very mild apical ischemia.  EF: 75%   . Decreased hearing   . Depression   . Diabetes ([place])   . DVT (deep venous thrombosis) ([place]) 2006   hx of  . Ectopic pregnancy with intrauterine pregnancy   . Hiatal hernia   . Hyperlipidemia   . PE (pulmonary embolism) 2006   hx of  . Sleep apnea    CPAP     Past  Surgical History:  Procedure Laterality Date  . ABDOMINAL EXPLORATION SURGERY    . BUNIONECTOMY     right  . COLONOSCOPY  2009  . ECTOPIC PREGNANCY SURGERY    . FOOT SURGERY    . TUBAL LIGATION      Family History  Problem Relation Age of Onset  . Heart failure Mother        died from  . Colon polyps Mother   . Hypertension Mother   . Sudden death Mother   . Obesity Mother   . Alcoholism Father   . Diabetes Maternal Museum/gallery exhibitions officer  . Diabetes Maternal Aunt   . Colon cancer Neg Hx   . Esophageal cancer Neg Hx   . Stomach cancer Neg Hx     Social History   Socioeconomic History  . Marital status: Divorced    Spouse name: Not on file  . Number of children: Not on file  . Years of education: Not on file  . Highest education level: Not on file  Occupational History  . Occupation: Retired Scientist, research (medical)  Tobacco Use  . Smoking status: Former    Packs/day: 1.00    Years: 50.00    Pack years: 50.00    Types: Cigarettes  Quit date: 11/21/2009    Years since quitting: 10.7  . Smokeless tobacco: Never  Vaping Use  . Vaping Use: Never used  Substance and Sexual Activity  . Alcohol use: Yes    Comment: rare 3 times a year per pt  . Drug use: No  . Sexual activity: Not Currently    Partners: Male  Other Topics Concern  . Not on file  Social History Narrative   Regular exercise         Social Determinants of Health   Financial Resource Strain: Low Risk   . Difficulty of Paying Living Expenses: Not hard at all  Food Insecurity: Not on file  Transportation Needs: Not on file  Physical Activity: Not on file  Stress: Not on file  Social Connections: Not on file  Intimate Partner Violence: Not on file    Outpatient Medications Prior to Visit  Medication Sig Dispense Refill  . blood glucose meter kit and supplies KIT Dispense based on patient and insurance preference. Use up to four times daily as directed. (FOR ICD-9 250.00, 250.01). 1 each 0  .  CALCIUM-VITAMIN D PO Take 600 Units by mouth 2 (two) times daily.    . cholecalciferol (VITAMIN D3) 10 MCG (400 UNIT) TABS tablet Take 400 Units by mouth 2 (two) times daily.    . Coenzyme Q10 100 MG TABS Take 100 mg by mouth daily.    Marland Kitchen ELIQUIS 5 MG TABS tablet Take 1 tablet by mouth twice daily 180 tablet 0  . Flaxseed, Linseed, (FLAXSEED OIL PO) Take 1,000 mg by mouth daily.     . furosemide (LASIX) 40 MG tablet TAKE 1 TABLET DAILY 90 tablet 2  . Insulin Pen Needle (NOVOFINE PLUS PEN NEEDLE) 32G X 4 MM MISC As directed 100 each 1  . Multiple Vitamin (MULTIVITAMIN) capsule Take 1 capsule by mouth daily.    . Omega-3 Fatty Acids (FISH OIL PO) Take 1,000 mg by mouth daily.    . Omega-3 Fatty Acids (FISH OIL PO) Take by mouth daily.    Marland Kitchen POTASSIUM CHLORIDE PO Take 1 tablet by mouth as needed (muscle cramps).     . PRALUENT 75 MG/ML SOAJ INJECT 1 PEN INTO THE SKIN EVERY 14 DAYS 2 mL 11  . rosuvastatin (CRESTOR) 10 MG tablet TAKE 1 TABLET DAILY 90 tablet 2  . Semaglutide,0.25 or 0.5MG/DOS, (OZEMPIC, 0.25 OR 0.5 MG/DOSE,) 2 MG/1.5ML SOPN Inject 0.5 mg into the skin once a week. 4.5 mL 1  . TRUE METRIX BLOOD GLUCOSE TEST test strip TEST  UP  TO FOUR TIMES DAILY AS DIRECTED 200 each 12  . TRUEPLUS LANCETS 33G MISC TEST  UP TO FOUR TIMES DAILY AS DIRECTED 200 each 12  . celecoxib (CELEBREX) 100 MG capsule Take 1 capsule (100 mg total) by mouth 2 (two) times daily. 60 capsule 3  . COVID-19 mRNA Vac-TriS, Pfizer, (PFIZER-BIONT COVID-19 VAC-TRIS) SUSP injection Inject into the muscle. (Patient not taking: Reported on 08/11/2020) 0.3 mL 0  . Semaglutide,0.25 or 0.5MG/DOS, (OZEMPIC, 0.25 OR 0.5 MG/DOSE,) 2 MG/1.5ML SOPN Inject 0.5 mg into the skin once a week. (Patient not taking: Reported on 08/11/2020) 1.5 mL 0  . Zoster Vaccine Adjuvanted [facility]) injection 0.55m IM x1  Repeat in 2-6 months (Patient not taking: Reported on 08/11/2020) 0.5 mL 1   No facility-administered medications prior to visit.     No Known Allergies  Review of Systems  Constitutional:  Negative for fever and malaise/fatigue.  HENT:  Negative for  congestion.   Eyes:  Negative for blurred vision.  Respiratory:  Negative for shortness of breath.   Cardiovascular:  Negative for chest pain, palpitations and leg swelling.  Gastrointestinal:  Negative for abdominal pain, blood in stool and nausea.  Genitourinary:  Negative for dysuria and frequency.  Musculoskeletal:  Negative for falls.  Skin:  Negative for rash.  Neurological:  Negative for dizziness, loss of consciousness and headaches.  Endo/Heme/Allergies:  Negative for environmental allergies.  Psychiatric/Behavioral:  Negative for depression. The patient is not nervous/anxious.       Objective:    Physical Exam Vitals and nursing note reviewed.  Constitutional:      General: She is not in acute distress.    Appearance: Normal appearance. She is not ill-appearing.  HENT:     Head: Normocephalic and atraumatic.     Comments: Ears irrigated successfully Tm I -- no errythema  No fluid     Right Ear: External ear normal. There is impacted cerumen.     Left Ear: External ear normal. There is impacted cerumen.     Ears:      Comments: Pt gave verbal consent to irrigation of ears Unable to remove wax with hoop Irrigated successfully and pt tol procedure well  Eyes:     Extraocular Movements: Extraocular movements intact.     Pupils: Pupils are equal, round, and reactive to light.  Cardiovascular:     Rate and Rhythm: Normal rate and regular rhythm.     Pulses: Normal pulses.     Heart sounds: Normal heart sounds. No murmur heard.   No gallop.  Pulmonary:     Effort: Pulmonary effort is normal. No respiratory distress.     Breath sounds: Normal breath sounds. No wheezing, rhonchi or rales.  Feet:     Comments: Diabetic Foot Exam - Simple   No data filed    Skin:    General: Skin is warm and dry.  Neurological:     Mental Status: She is alert  and oriented to person, place, and time.  Psychiatric:        Behavior: Behavior normal.    BP 130/72 (BP Location: Left Arm, Patient Position: Sitting, Cuff Size: Large)   Pulse 82   Temp 98.3 F (36.8 C) (Oral)   Resp 18   Ht '5\' 2"'  (1.575 m)   Wt 190 lb (86.2 kg)   SpO2 97%   BMI 34.75 kg/m  Wt Readings from Last 3 Encounters:  08/11/20 190 lb (86.2 kg)  05/10/20 186 lb 12.8 oz (84.7 kg)  12/10/19 186 lb 9.6 oz (84.6 kg)    Diabetic Foot Exam - Simple   Simple Foot Form Diabetic Foot exam was performed with the following findings: Yes 08/11/2020 12:02 PM  Visual Inspection No deformities, no ulcerations, no other skin breakdown bilaterally: Yes Sensation Testing Intact to touch and monofilament testing bilaterally: Yes Pulse Check Posterior Tibialis and Dorsalis pulse intact bilaterally: Yes Comments    Lab Results  Component Value Date   WBC 5.3 01/31/2019   HGB 12.0 01/31/2019   HCT 36.2 01/31/2019   PLT 202 01/31/2019   GLUCOSE 114 (H) 05/10/2020   CHOL 200 05/10/2020   TRIG 109.0 05/10/2020   HDL 116.80 05/10/2020   LDLDIRECT 95.9 10/06/2012   LDLCALC 61 05/10/2020   ALT 19 05/10/2020   AST 20 05/10/2020   NA 141 05/10/2020   K 4.4 05/10/2020   CL 103 05/10/2020   CREATININE 0.84 05/10/2020  BUN 21 05/10/2020   CO2 31 05/10/2020   TSH 1.250 05/27/2017   INR 2.0 (H) 01/29/2019   HGBA1C 6.6 (H) 05/10/2020   MICROALBUR 0.6 11/10/2019    Lab Results  Component Value Date   TSH 1.250 05/27/2017   Lab Results  Component Value Date   WBC 5.3 01/31/2019   HGB 12.0 01/31/2019   HCT 36.2 01/31/2019   MCV 93.8 01/31/2019   PLT 202 01/31/2019   Lab Results  Component Value Date   NA 141 05/10/2020   K 4.4 05/10/2020   CO2 31 05/10/2020   GLUCOSE 114 (H) 05/10/2020   BUN 21 05/10/2020   CREATININE 0.84 05/10/2020   BILITOT 1.1 05/10/2020   ALKPHOS 78 05/10/2020   AST 20 05/10/2020   ALT 19 05/10/2020   PROT 7.0 05/10/2020   ALBUMIN 4.6  05/10/2020   CALCIUM 10.3 05/10/2020   ANIONGAP 11 01/31/2019   GFR 66.46 05/10/2020   Lab Results  Component Value Date   CHOL 200 05/10/2020   Lab Results  Component Value Date   HDL 116.80 05/10/2020   Lab Results  Component Value Date   LDLCALC 61 05/10/2020   Lab Results  Component Value Date   TRIG 109.0 05/10/2020   Lab Results  Component Value Date   CHOLHDL 2 05/10/2020   Lab Results  Component Value Date   HGBA1C 6.6 (H) 05/10/2020       Assessment & Plan:   Problem List Items Addressed This Visit       Unprioritized   Bilateral impacted cerumen    Irrigated successfully        DM (diabetes mellitus) type II uncontrolled, periph vascular disorder ([place])    hgba1c to be checked, minimize simple carbs. Increase exercise as tolerated. Continue current meds        Essential hypertension    Well controlled, no changes to meds. Encouraged heart healthy diet such as the DASH diet and exercise as tolerated.        Hyperlipidemia associated with type 2 diabetes mellitus ([place])    Encourage heart healthy diet such as MIND or DASH diet, increase exercise, avoid trans fats, simple carbohydrates and processed foods, consider a krill or fish or flaxseed oil cap daily.        Relevant Orders   Lipid panel   Hemoglobin A1c   Comprehensive metabolic panel   Microalbumin / creatinine urine ratio   Other Visit Diagnoses     Uncontrolled type 2 diabetes mellitus with hyperglycemia (H[facility])    -  Primary   Relevant Orders   Lipid panel   Hemoglobin A1c   Comprehensive metabolic panel   Microalbumin / creatinine urine ratio   Primary hypertension       Relevant Orders   Lipid panel   Hemoglobin A1c   Comprehensive metabolic panel   Microalbumin / creatinine urine ratio        No orders of the defined types were placed in this encounter.   I, Dr. Roma Schanz, DO, personally preformed the services described in this documentation.  All medical  record entries made by the scribe were at my direction and in my presence.  I have reviewed the chart and discharge instructions (if applicable) and agree that the record reflects my personal performance and is accurate and complete. 08/11/2020   I,Sierra Ford,acting as a scribe for [employer], DO.,have documented all relevant documentation on the behalf of Ann Held, DO,as directed  by  Ann Held, DO while in the presence of Ann Held, DO.   Ann Held, DO

## 2020-08-11 NOTE — Assessment & Plan Note (Signed)
Well controlled, no changes to meds. Encouraged heart healthy diet such as the DASH diet and exercise as tolerated.  °

## 2020-08-11 NOTE — Assessment & Plan Note (Signed)
Encourage heart healthy diet such as MIND or DASH diet, increase exercise, avoid trans fats, simple carbohydrates and processed foods, consider a krill or fish or flaxseed oil cap daily.  °

## 2020-08-11 NOTE — Assessment & Plan Note (Signed)
hgba1c to be checked, minimize simple carbs. Increase exercise as tolerated. Continue current meds  

## 2020-08-12 NOTE — Progress Notes (Signed)
Subjective:   Sierra Ford is a 80 y.o. female who presents for Medicare Annual (Subsequent) preventive examination.  I connected with Starlett today by telephone and verified that I am speaking with the correct person using two identifiers. Location patient: home Location provider: work Persons participating in the virtual visit: patient, Marine scientist.    I discussed the limitations, risks, security and privacy concerns of performing an evaluation and management service by telephone and the availability of in person appointments. I also discussed with the patient that there may be a patient responsible charge related to this service. The patient expressed understanding and verbally consented to this telephonic visit.    Interactive audio and video telecommunications were attempted between this provider and patient, however failed, due to patient having technical difficulties OR patient did not have access to video capability.  We continued and completed visit with audio only.  Some vital signs may be absent or patient reported.   Time Spent with patient on telephone encounter: 25 minutes   Review of Systems     Cardiac Risk Factors include: advanced age (>6mn, >>46women);diabetes mellitus;dyslipidemia;hypertension;obesity (BMI >30kg/m2)     Objective:    Today's Vitals   08/15/20 1140  Weight: 190 lb (86.2 kg)  Height: _0  (1.575 m)  PainSc: 3    Body mass index is 34.75 kg/m.  Advanced Directives 08/15/2020 05/05/2019 01/28/2019 10/24/2017 07/17/2017  Does Patient Have a Medical Advance Directive? No;Yes No Yes Yes No  Type of AAcademic librarian- Healthcare Power of Attorney Living will -  Does patient want to make changes to medical advance directive? - No - Patient declined No - Patient declined No - Patient declined -  Copy of [place] Chart? No - copy requested - No - copy requested - -    Current Medications  (verified) Outpatient Encounter Medications as of 08/15/2020  Medication Sig   blood glucose meter kit and supplies KIT Dispense based on patient and insurance preference. Use up to four times daily as directed. (FOR ICD-9 250.00, 250.01).   CALCIUM-VITAMIN D PO Take 600 Units by mouth 2 (two) times daily.   cholecalciferol (VITAMIN D3) 10 MCG (400 UNIT) TABS tablet Take 400 Units by mouth 2 (two) times daily.   Coenzyme Q10 100 MG TABS Take 100 mg by mouth daily.   ELIQUIS 5 MG TABS tablet Take 1 tablet by mouth twice daily   Flaxseed, Linseed, (FLAXSEED OIL PO) Take 1,000 mg by mouth daily.    furosemide (LASIX) 40 MG tablet TAKE 1 TABLET DAILY   Insulin Pen Needle (NOVOFINE PLUS PEN NEEDLE) 32G X 4 MM MISC As directed   Multiple Vitamin (MULTIVITAMIN) capsule Take 1 capsule by mouth daily.   Omega-3 Fatty Acids (FISH OIL PO) Take 1,000 mg by mouth daily.   Omega-3 Fatty Acids (FISH OIL PO) Take by mouth daily.   POTASSIUM CHLORIDE PO Take 1 tablet by mouth as needed (muscle cramps).    PRALUENT 75 MG/ML SOAJ INJECT 1 PEN INTO THE SKIN EVERY 14 DAYS   rosuvastatin (CRESTOR) 10 MG tablet TAKE 1 TABLET DAILY   Semaglutide,0.25 or 0.5MG/DOS, (OZEMPIC, 0.25 OR 0.5 MG/DOSE,) 2 MG/1.5ML SOPN Inject 0.5 mg into the skin once a week.   TRUE METRIX BLOOD GLUCOSE TEST test strip TEST  UP  TO FOUR TIMES DAILY AS DIRECTED   TRUEPLUS LANCETS 33G MISC TEST  UP TO FOUR TIMES DAILY AS DIRECTED   [DISCONTINUED]  omega-3 acid ethyl esters (LOVAZA) 1 G capsule Take 1 g by mouth 3 (three) times daily.     [DISCONTINUED] pravastatin (PRAVACHOL) 20 MG tablet Take 20 mg by mouth daily.     No facility-administered encounter medications on file as of 08/15/2020.    Allergies (verified) Patient has no known allergies.   History: Past Medical History:  Diagnosis Date   Anxiety    Back pain    Chest pain    CLite with apical ischemia in 2006 - normal coronary arteries by [facility] in 12/2004;  Myoview 11/12:  Low  risk stress nuclear study with a small, partially reversible apical defect most likely related to apical thinning; cannot R/O very mild apical ischemia.  EF: 75%    Decreased hearing    Depression    Diabetes ([place])    DVT (deep venous thrombosis) ([place]) 2006   hx of   Ectopic pregnancy with intrauterine pregnancy    Hiatal hernia    Hyperlipidemia    PE (pulmonary embolism) 2006   hx of   Sleep apnea    CPAP    Past Surgical History:  Procedure Laterality Date   ABDOMINAL EXPLORATION SURGERY     BUNIONECTOMY     right   COLONOSCOPY  2009   ECTOPIC PREGNANCY SURGERY     FOOT SURGERY     TUBAL LIGATION     Family History  Problem Relation Age of Onset   Heart failure Mother        died from   Colon polyps Mother    Hypertension Mother    Sudden death Mother    Obesity Mother    Alcoholism Father    Diabetes Maternal Aunt        grandaughter   Diabetes Maternal Aunt    Colon cancer Neg Hx    Esophageal cancer Neg Hx    Stomach cancer Neg Hx    Social History   Socioeconomic History   Marital status: Divorced    Spouse name: Not on file   Number of children: Not on file   Years of education: Not on file   Highest education level: Not on file  Occupational History   Occupation: Retired Scientist, research (medical)  Tobacco Use   Smoking status: Former    Packs/day: 1.00    Years: 50.00    Pack years: 50.00    Types: Cigarettes    Quit date: 11/21/2009    Years since quitting: 10.7   Smokeless tobacco: Never  Vaping Use   Vaping Use: Never used  Substance and Sexual Activity   Alcohol use: Yes    Comment: rare 3 times a year per pt   Drug use: No   Sexual activity: Not Currently    Partners: Male  Other Topics Concern   Not on file  Social History Narrative   Regular exercise         Social Determinants of Health   Financial Resource Strain: Low Risk    Difficulty of Paying Living Expenses: Not hard at all  Food Insecurity: No Food Insecurity   Worried About  Charity fundraiser in the Last Year: Never true   Ran Out of Food in the Last Year: Never true  Transportation Needs: No Transportation Needs   Lack of Transportation (Medical): No   Lack of Transportation (Non-Medical): No  Physical Activity: Sufficiently Active   Days of Exercise per Week: 4 days   Minutes of Exercise per Session: 60 min  Stress: No Stress Concern Present   Feeling of Stress : Only a little  Social Connections: Moderately Integrated   Frequency of Communication with Friends and Family: More than three times a week   Frequency of Social Gatherings with Friends and Family: More than three times a week   Attends Religious Services: More than 4 times per year   Active Member of [employer] or Organizations: Yes   Attends Archivist Meetings: 1 to 4 times per year   Marital Status: Widowed    Tobacco Counseling Counseling given: Not Answered   Clinical Intake:  Pre-visit preparation completed: Yes  Pain : 0-10 Pain Score: 3  Pain Type: Chronic pain (seeing Ortho) Pain Location: Hip (and shoulder) Pain Onset: More than a month ago Pain Frequency: Constant     Nutritional Status: BMI > 30  Obese Nutritional Risks: None Diabetes: Yes CBG done?: No Did pt. bring in CBG monitor from home?: No (phone visit)  How often do you need to have someone help you when you read instructions, pamphlets, or other written materials from your doctor or pharmacy?: 1 - Never  Diabetes:  Is the patient diabetic?  Yes  If diabetic, was a CBG obtained today?  No  Did the patient bring in their glucometer from home?  No phone How often do you monitor your CBG's? occasionally.   Financial Strains and Diabetes Management:  Are you having any financial strains with the device, your supplies or your medication? No .  Does the patient want to be seen by Chronic Care Management for management of their diabetes?  No  Would the patient like to be referred to a Nutritionist or  for Diabetic Management?  No   Diabetic Exams:  Diabetic Eye Exam: Completed 11/05/2019.   Diabetic Foot Exam: Completed 08/11/20.   Interpreter Needed?: No  Information entered by :: Caroleen Hamman LPN   Activities of Daily Living In your present state of health, do you have any difficulty performing the following activities: 08/15/2020 05/10/2020  Hearing? N N  Vision? N N  Difficulty concentrating or making decisions? N N  Walking or climbing stairs? N Y  Dressing or bathing? N N  Doing errands, shopping? N N  Preparing Food and eating ? N -  Using the Toilet? N -  In the past six months, have you accidently leaked urine? N -  Do you have problems with loss of bowel control? N -  Managing your Medications? N -  Managing your Finances? N -  Housekeeping or managing your Housekeeping? N -  Some recent data might be hidden    Patient Care Team: Carollee Herter, Alferd Apa, DO as PCP - General Minus Breeding, MD as PCP - Cardiology (Cardiology) Maisie Fus, MD as Consulting Physician (Obstetrics and Gynecology) Lamonte Sakai Rose Fillers, MD as Consulting Physician (Pulmonary Disease) Day, Melvenia Beam, [facility] (Inactive) as Pharmacist (Pharmacist)  Indicate any recent Medical Services you may have received from other than [facility] providers in the past year (date may be approximate).     Assessment:   This is a routine wellness examination for Darcell.  Hearing/Vision screen Hearing Screening - Comments:: Patient c/o mild hearing loss Vision Screening - Comments:: Last eye exam-10/2019-[facility]  Dietary issues and exercise activities discussed: Current Exercise Habits: Home exercise routine, Type of exercise: Other - see comments (water aerobics), Time (Minutes): 60, Frequency (Times/Week): 4, Weekly Exercise (Minutes/Week): 240, Intensity: Mild, Exercise limited by: None identified   Goals Addressed  This Visit's Progress    Patient Stated       Eat healthier & lose some  weight        Depression Screen PHQ 2/9 Scores 08/15/2020 08/11/2020 05/10/2020 05/05/2019 05/27/2017 10/09/2016 07/15/2015  PHQ - 2 Score 0 0 0 0 2 0 0  PHQ- 9 Score - - - - 15 - -    Fall Risk Fall Risk  08/15/2020 08/11/2020 05/10/2020 05/05/2019 07/15/2015  Falls in the past year? 0 0 0 0 No  Number falls in past yr: 0 0 0 0 -  Injury with Fall? 0 0 0 0 -  Risk for fall due to : - - Impaired mobility - -  Follow up Falls prevention discussed - Falls evaluation completed Education provided;Falls prevention discussed -    FALL RISK PREVENTION PERTAINING TO THE HOME:  Any stairs in or around the home? No  Home free of loose throw rugs in walkways, pet beds, electrical cords, etc? Yes  Adequate lighting in your home to reduce risk of falls? Yes   ASSISTIVE DEVICES UTILIZED TO PREVENT FALLS:  Life alert? No  Use of a cane, walker or w/c? No  Grab bars in the bathroom? Yes  Shower chair or bench in shower? Yes  Elevated toilet seat or a handicapped toilet? No   TIMED UP AND GO:  Was the test performed? No . Phone visit   Cognitive Function:Normal cognitive status assessed by  this Nurse Health Advisor. No abnormalities found.   MMSE - Mini Mental State Exam 10/09/2016 07/15/2015  Orientation to time 5 5  Orientation to Place 5 5  Registration 3 3  Attention/ Calculation 5 5  Recall 3 3  Language- name 2 objects 2 2  Language- repeat 1 1  Language- follow 3 step command 3 3  Language- read & follow direction 1 1  Write a sentence 1 1  Copy design 1 1  Total score 30 30        Immunizations Immunization History  Administered Date(s) Administered   Fluad Quad(high Dose 65+) 12/04/2019   Influenza, High Dose Seasonal PF 12/22/2018   PFIZER Comirnaty(Gray Top)Covid-19 Tri-Sucrose Vaccine 06/08/2020   PFIZER(Purple Top)SARS-COV-2 Vaccination 03/06/2019, 03/27/2019, 11/10/2019   Pneumococcal Conjugate-13 04/24/2013   Pneumococcal Polysaccharide-23 08/07/2007, 12/04/2019    Td 03/03/2003   Tdap 11/06/2018   Zoster Recombinat (Shingrix) 11/06/2018, 05/11/2019   Zoster, Live 08/14/2007, 11/13/2007    TDAP status: Up to date  Flu Vaccine status: Up to date  Pneumococcal vaccine status: Up to date  Covid-19 vaccine status: Completed vaccines  Qualifies for Shingles Vaccine? No   Zostavax completed Yes   Shingrix Completed?: Yes  Screening Tests Health Maintenance  Topic Date Due   MAMMOGRAM  03/25/2019   INFLUENZA VACCINE  09/19/2020   OPHTHALMOLOGY EXAM  11/04/2020   HEMOGLOBIN A1C  02/10/2021   FOOT EXAM  08/11/2021   URINE MICROALBUMIN  08/11/2021   TETANUS/TDAP  11/05/2028   DEXA SCAN  Completed   COVID-19 Vaccine  Completed   Hepatitis C Screening  Completed   PNA vac Low Risk Adult  Completed   Zoster Vaccines- Shingrix  Completed   HPV VACCINES  Aged Out    Health Maintenance  Health Maintenance Due  Topic Date Due   MAMMOGRAM  03/25/2019    Colorectal cancer screening: No longer required.   Mammogram status: Completed 07/15/20-Bilateral at GYN office per patient. Repeat every year Requested copy of report be sen to PCP  Bone  Density status: Per patient completed at GYN office last year. Requested copy of report be sen to PCP  Lung Cancer Screening: (Low Dose CT Chest recommended if Age 32-80 years, 30 pack-year currently smoking OR have quit w/in 15years.) does qualify.   Lung Cancer Screening Referral: Referral ordered today  Additional Screening:  Hepatitis C Screening:  Completed 05/10/2020  Vision Screening: Recommended annual ophthalmology exams for early detection of glaucoma and other disorders of the eye. Is the patient up to date with their annual eye exam?  Yes  Who is the provider or what is the name of the office in which the patient attends annual eye exams? [facility]   Dental Screening: Recommended annual dental exams for proper oral hygiene  Community Resource Referral / Chronic Care Management: CRR  required this visit?  No   CCM required this visit?  No      Plan:     I have personally reviewed and noted the following in the patient's chart:   Medical and social history Use of alcohol, tobacco or illicit drugs  Current medications and supplements including opioid prescriptions.  Functional ability and status Nutritional status Physical activity Advanced directives List of other physicians Hospitalizations, surgeries, and ER visits in previous 12 months Vitals Screenings to include cognitive, depression, and falls Referrals and appointments  In addition, I have reviewed and discussed with patient certain preventive protocols, quality metrics, and best practice recommendations. A written personalized care plan for preventive services as well as general preventive health recommendations were provided to patient.   Due to this being a telephonic visit, the after visit summary with patients personalized plan was offered to patient via mail or my-chart. Patient would like to access on my-chart.   Marta Antu, LPN   [id number]  Nurse Health Advisor  Nurse Notes: None

## 2020-08-14 ENCOUNTER — Other Ambulatory Visit: Payer: Self-pay | Admitting: Family Medicine

## 2020-08-15 ENCOUNTER — Ambulatory Visit (INDEPENDENT_AMBULATORY_CARE_PROVIDER_SITE_OTHER): Payer: Medicare PPO

## 2020-08-15 VITALS — Ht 62.0 in | Wt 190.0 lb

## 2020-08-15 DIAGNOSIS — Z122 Encounter for screening for malignant neoplasm of respiratory organs: Secondary | ICD-10-CM | POA: Diagnosis not present

## 2020-08-15 DIAGNOSIS — Z0000 Encounter for general adult medical examination without abnormal findings: Secondary | ICD-10-CM | POA: Diagnosis not present

## 2020-08-15 NOTE — Patient Instructions (Signed)
[name] , Thank you for taking time to complete your Medicare Wellness Visit. I appreciate your ongoing commitment to your health goals. Please review the following plan we discussed and let me know if I can assist you in the future.   Screening recommendations/referrals: Colonoscopy: No longer required Mammogram: Completed 07/15/20-Due 07/15/21-Please have GYN send a copy of results to Dr. Etter Sjogren. Bone Density: Completed at GYN office. Please have GYN send a copy of results to Dr. Etter Sjogren. Recommended yearly ophthalmology/optometry visit for glaucoma screening and checkup Recommended yearly dental visit for hygiene and checkup  Vaccinations: Influenza vaccine: Up to date Pneumococcal vaccine: Up to date Tdap vaccine: Up to date-Due-11/05/2028 Shingles vaccine: Completed vaccines   Covid-19:Up to date  Advanced directives: Please bring a copy for your chart  Conditions/risks identified: See problem list  Next appointment: Follow up in one year for your annual wellness visit 08/21/2021 @ 1:00.   Preventive Care 79 Years and Older, Female Preventive care refers to lifestyle choices and visits with your health care provider that can promote health and wellness. What does preventive care include? A yearly physical exam. This is also called an annual well check. Dental exams once or twice a year. Routine eye exams. Ask your health care provider how often you should have your eyes checked. Personal lifestyle choices, including: Daily care of your teeth and gums. Regular physical activity. Eating a healthy diet. Avoiding tobacco and drug use. Limiting alcohol use. Practicing safe sex. Taking low-dose aspirin every day. Taking vitamin and mineral supplements as recommended by your health care provider. What happens during an annual well check? The services and screenings done by your health care provider during your annual well check will depend on your age, overall health, lifestyle  risk factors, and family history of disease. Counseling  Your health care provider may ask you questions about your: Alcohol use. Tobacco use. Drug use. Emotional well-being. Home and relationship well-being. Sexual activity. Eating habits. History of falls. Memory and ability to understand (cognition). Work and work Statistician. Reproductive health. Screening  You may have the following tests or measurements: Height, weight, and BMI. Blood pressure. Lipid and cholesterol levels. These may be checked every 5 years, or more frequently if you are over 79 years old. Skin check. Lung cancer screening. You may have this screening every year starting at age 79 if you have a 30-pack-year history of smoking and currently smoke or have quit within the past 15 years. Fecal occult blood test (FOBT) of the stool. You may have this test every year starting at age 79. Flexible sigmoidoscopy or colonoscopy. You may have a sigmoidoscopy every 5 years or a colonoscopy every 10 years starting at age 79. Hepatitis C blood test. Hepatitis B blood test. Sexually transmitted disease (STD) testing. Diabetes screening. This is done by checking your blood sugar (glucose) after you have not eaten for a while (fasting). You may have this done every 1-3 years. Bone density scan. This is done to screen for osteoporosis. You may have this done starting at age 79. Mammogram. This may be done every 1-2 years. Talk to your health care provider about how often you should have regular mammograms. Talk with your health care provider about your test results, treatment options, and if necessary, the need for more tests. Vaccines  Your health care provider may recommend certain vaccines, such as: Influenza vaccine. This is recommended every year. Tetanus, diphtheria, and acellular pertussis (Tdap, Td) vaccine. You may need a Td booster every  79 years. Zoster vaccine. You may need this after age 79. Pneumococcal  13-valent conjugate (PCV13) vaccine. One dose is recommended after age 79. Pneumococcal polysaccharide (PPSV23) vaccine. One dose is recommended after age 79. Talk to your health care provider about which screenings and vaccines you need and how often you need them. This information is not intended to replace advice given to you by your health care provider. Make sure you discuss any questions you have with your health care provider. Document Released: 03/04/2015 Document Revised: 10/26/2015 Document Reviewed: 12/07/2014 Elsevier Interactive Patient Education  2017 Elim Prevention in the Home Falls can cause injuries. They can happen to people of all ages. There are many things you can do to make your home safe and to help prevent falls. What can I do on the outside of my home? Regularly fix the edges of walkways and driveways and fix any cracks. Remove anything that might make you trip as you walk through a door, such as a raised step or threshold. Trim any bushes or trees on the path to your home. Use bright outdoor lighting. Clear any walking paths of anything that might make someone trip, such as rocks or tools. Regularly check to see if handrails are loose or broken. Make sure that both sides of any steps have handrails. Any raised decks and porches should have guardrails on the edges. Have any leaves, snow, or ice cleared regularly. Use sand or salt on walking paths during winter. Clean up any spills in your garage right away. This includes oil or grease spills. What can I do in the bathroom? Use night lights. Install grab bars by the toilet and in the tub and shower. Do not use towel bars as grab bars. Use non-skid mats or decals in the tub or shower. If you need to sit down in the shower, use a plastic, non-slip stool. Keep the floor dry. Clean up any water that spills on the floor as soon as it happens. Remove soap buildup in the tub or shower regularly. Attach  bath mats securely with double-sided non-slip rug tape. Do not have throw rugs and other things on the floor that can make you trip. What can I do in the bedroom? Use night lights. Make sure that you have a light by your bed that is easy to reach. Do not use any sheets or blankets that are too big for your bed. They should not hang down onto the floor. Have a firm chair that has side arms. You can use this for support while you get dressed. Do not have throw rugs and other things on the floor that can make you trip. What can I do in the kitchen? Clean up any spills right away. Avoid walking on wet floors. Keep items that you use a lot in easy-to-reach places. If you need to reach something above you, use a strong step stool that has a grab bar. Keep electrical cords out of the way. Do not use floor polish or wax that makes floors slippery. If you must use wax, use non-skid floor wax. Do not have throw rugs and other things on the floor that can make you trip. What can I do with my stairs? Do not leave any items on the stairs. Make sure that there are handrails on both sides of the stairs and use them. Fix handrails that are broken or loose. Make sure that handrails are as long as the stairways. Check any carpeting to make  sure that it is firmly attached to the stairs. Fix any carpet that is loose or worn. Avoid having throw rugs at the top or bottom of the stairs. If you do have throw rugs, attach them to the floor with carpet tape. Make sure that you have a light switch at the top of the stairs and the bottom of the stairs. If you do not have them, ask someone to add them for you. What else can I do to help prevent falls? Wear shoes that: Do not have high heels. Have rubber bottoms. Are comfortable and fit you well. Are closed at the toe. Do not wear sandals. If you use a stepladder: Make sure that it is fully opened. Do not climb a closed stepladder. Make sure that both sides of the  stepladder are locked into place. Ask someone to hold it for you, if possible. Clearly mark and make sure that you can see: Any grab bars or handrails. First and last steps. Where the edge of each step is. Use tools that help you move around (mobility aids) if they are needed. These include: Canes. Walkers. Scooters. Crutches. Turn on the lights when you go into a dark area. Replace any light bulbs as soon as they burn out. Set up your furniture so you have a clear path. Avoid moving your furniture around. If any of your floors are uneven, fix them. If there are any pets around you, be aware of where they are. Review your medicines with your doctor. Some medicines can make you feel dizzy. This can increase your chance of falling. Ask your doctor what other things that you can do to help prevent falls. This information is not intended to replace advice given to you by your health care provider. Make sure you discuss any questions you have with your health care provider. Document Released: 12/02/2008 Document Revised: 07/14/2015 Document Reviewed: 03/12/2014 Elsevier Interactive Patient Education  2017 [employer].

## 2020-08-17 ENCOUNTER — Ambulatory Visit (INDEPENDENT_AMBULATORY_CARE_PROVIDER_SITE_OTHER): Payer: Medicare PPO | Admitting: Pharmacist

## 2020-08-17 DIAGNOSIS — E1165 Type 2 diabetes mellitus with hyperglycemia: Secondary | ICD-10-CM | POA: Diagnosis not present

## 2020-08-17 DIAGNOSIS — I5032 Chronic diastolic (congestive) heart failure: Secondary | ICD-10-CM

## 2020-08-17 DIAGNOSIS — E785 Hyperlipidemia, unspecified: Secondary | ICD-10-CM

## 2020-08-17 DIAGNOSIS — I779 Disorder of arteries and arterioles, unspecified: Secondary | ICD-10-CM

## 2020-08-18 NOTE — Chronic Care Management (AMB) (Signed)
Chronic Care Management Pharmacy Note  08/18/2020 Name:  BRITTANIE DOSANJH MRN:  [id number] DOB:  Jun 06, 1940  Summary: Serum calcium elevated   Recommendations/Changes made from today's visit: Decrease calcium supplement form bid to qd;   Plan: PCP already has plans to recheck CMP and PTH  Subjective: ARNETTE DRIGGS is an 80 y.o. year old female who is a primary patient of Ann Held, DO.  The CCM team was consulted for assistance with disease management and care coordination needs.    Engaged with patient by telephone for follow up visit in response to provider referral for pharmacy case management and/or care coordination services.   Consent to Services:  The patient was given information about Chronic Care Management services, agreed to services, and gave verbal consent prior to initiation of services.  Please see initial visit note for detailed documentation.   Patient Care Team: Carollee Herter, Alferd Apa, DO as PCP - Cheral Bay, MD as PCP - Cardiology (Cardiology) Maisie Fus, MD as Consulting Physician (Obstetrics and Gynecology) Collene Gobble, MD as Consulting Physician (Pulmonary Disease) Cherre Robins, PharmD (Pharmacist)  Recent office visits: 08/11/2020 - PCP (Dr Etter Sjogren) 3 month f/u; No med changes  Recent consult visits: 06/24/2020 - ortho (Dr Emiliano Dyer) seen for pain of right shoulder joint 06/23/2020 - ortho (Dr Caralyn Guile) seen for bilateral carpel tunnel syndrome 06/13/2020 - ortho (Dr Nelva Bush) seen for bilateral carpel tunnel syndrome   Hospital visits: None in previous 6 months  Objective:  Lab Results  Component Value Date   CREATININE 0.83 08/11/2020   CREATININE 0.84 05/10/2020   CREATININE 0.82 11/10/2019    Lab Results  Component Value Date   HGBA1C 6.7 (H) 08/11/2020   Last diabetic Eye exam:  Lab Results  Component Value Date/Time   HMDIABEYEEXA No Retinopathy 11/05/2019 12:00 AM    Last diabetic Foot exam: No  results found for: HMDIABFOOTEX      Component Value Date/Time   CHOL 201 (H) 08/11/2020 1114   CHOL 209 (H) 08/26/2018 1018   TRIG 74.0 08/11/2020 1114   HDL 120.50 08/11/2020 1114   HDL 101 08/26/2018 1018   CHOLHDL 2 08/11/2020 1114   VLDL 14.8 08/11/2020 1114   LDLCALC 66 08/11/2020 1114   LDLCALC 69 11/10/2019 1122   LDLDIRECT 95.9 10/06/2012 1248    Hepatic Function Latest Ref Rng & Units 08/11/2020 05/10/2020 11/10/2019  Total Protein 6.0 - 8.3 g/dL 7.0 7.0 7.0  Albumin 3.5 - 5.2 g/dL 4.6 4.6 -  AST 0 - 37 U/L _0 ALT 0 - 35 U/L _1 Alk Phosphatase 39 - 117 U/L 76 78 -  Total Bilirubin 0.2 - 1.2 mg/dL 1.3(H) 1.1 1.1  Bilirubin, Direct 0.00 - 0.40 mg/dL - - -    Lab Results  Component Value Date/Time   TSH 1.250 05/27/2017 12:24 PM   TSH 1.27 12/28/2016 12:00 AM   TSH 1.00 01/03/2015 12:00 AM   TSH 1.61 08/07/2007 09:38 AM   FREET4 1.05 05/27/2017 12:24 PM    CBC Latest Ref Rng & Units 01/31/2019 01/30/2019 01/29/2019  WBC 4.0 - 10.5 K/uL 5.3 5.6 7.6  Hemoglobin 12.0 - 15.0 g/dL 12.0 12.9 13.9  Hematocrit 36.0 - 46.0 % 36.2 38.2 41.3  Platelets 150 - 400 K/uL 202 175 183    Lab Results  Component Value Date/Time   VD25OH 39.1 01/28/2018 09:24 AM   VD25OH 45.9 05/27/2017 12:24 PM    Clinical  ASCVD: Yes  The ASCVD Risk score Mikey Bussing DC Jr., et al., 2013) failed to calculate for the following reasons:   The valid HDL cholesterol range is 20 to 100 mg/dL     Social History   Tobacco Use  Smoking Status Former   Packs/day: 1.00   Years: 50.00   Pack years: 50.00   Types: Cigarettes   Quit date: 11/21/2009   Years since quitting: 10.7  Smokeless Tobacco Never   BP Readings from Last 3 Encounters:  08/11/20 130/72  05/10/20 108/78  12/10/19 123/75   Pulse Readings from Last 3 Encounters:  08/11/20 82  05/10/20 78  12/10/19 69   Wt Readings from Last 3 Encounters:  08/15/20 190 lb (86.2 kg)  08/11/20 190 lb (86.2 kg)  05/10/20 186 lb  12.8 oz (84.7 kg)    Assessment: Review of patient past medical history, allergies, medications, health status, including review of consultants reports, laboratory and other test data, was performed as part of comprehensive evaluation and provision of chronic care management services.   SDOH:  (Social Determinants of Health) assessments and interventions performed:    CCM Care Plan  No Known Allergies  Medications Reviewed Today     Reviewed by Cherre Robins, PharmD (Pharmacist) on 08/18/20 at 16  Med List Status: <None>   Medication Order Taking? Sig Documenting Provider Last Dose Status Informant  blood glucose meter kit and supplies KIT [id number] Yes Dispense based on patient and insurance preference. Use up to four times daily as directed. (FOR ICD-9 250.00, 250.01). Ann Held, DO Taking Active Self  CALCIUM-VITAMIN D PO [id number] Yes Take 600 mg by mouth 2 (two) times daily. [provider] Taking Active Self  cholecalciferol (VITAMIN D3) 10 MCG (400 UNIT) TABS tablet [id number] Yes Take 400 Units by mouth 2 (two) times daily. [provider] Taking Active Self  Coenzyme Q10 100 MG TABS [id number] Yes Take 100 mg by mouth daily. [provider] Taking Active Self  ELIQUIS 5 MG TABS tablet [id number] Yes Take 1 tablet by mouth twice daily Minus Breeding, MD Taking Active   Flaxseed, Linseed, (FLAXSEED OIL PO) [id number] Yes Take 1,000 mg by mouth daily.  [provider] Taking Active Self  furosemide (LASIX) 40 MG tablet [id number] Yes TAKE 1 TABLET DAILY Minus Breeding, MD Taking Active   Insulin Pen Needle (NOVOFINE PLUS PEN NEEDLE) 32G X 4 MM MISC [id number] Yes As directed Ann Held, DO Taking Active   Multiple Vitamin (MULTIVITAMIN) capsule [id number] Yes Take 1 capsule by mouth daily. [provider] Taking Active     Discontinued 05/07/11 1124 (Discontinued by provider) Omega-3 Fatty Acids (FISH OIL PO) [id number] Yes  Take 1,000 mg by mouth daily. [provider] Taking Active Self  POTASSIUM CHLORIDE PO [id number] Yes Take 1 tablet by mouth as needed (muscle cramps).  [provider] Taking Active Self           Med Note Hali Marry, SARA E   Tue Jan 27, 2019  4:18 PM) Pt states she takes when she takes furosemide to mitigate cramping  PRALUENT 75 MG/ML SOAJ [id number] Yes INJECT 1 PEN INTO THE SKIN EVERY 14 DAYS Minus Breeding, MD Taking Active     Discontinued 05/07/11 1124 (Change in therapy) rosuvastatin (CRESTOR) 10 MG tablet [id number]  TAKE 1 TABLET DAILY Minus Breeding, MD  Active   Semaglutide,0.25 or 0.5MG /DOS, (OZEMPIC, 0.25 OR 0.5 MG/DOSE,) 2 MG/1.5ML SOPN [id number] Yes Inject 0.5 mg into the skin  once a week. Carollee Herter, Kendrick Fries R, DO Taking Active   TRUE METRIX BLOOD GLUCOSE TEST test strip [id number] Yes TEST  UP  TO FOUR TIMES DAILY AS DIRECTED Ann Held, DO Taking Active Self  TRUEPLUS LANCETS 33G MISC [id number] Yes TEST  UP TO FOUR TIMES DAILY AS DIRECTED Ann Held, DO Taking Active Self            Patient Active Problem List   Diagnosis Date Noted   Acute hypoxemic respiratory failure ([place]) 01/27/2019   Acute pulmonary embolism with acute cor pulmonale ([place]) 01/27/2019   Pulmonary emboli ([place]) 01/27/2019   Educated about COVID-19 virus infection 01/26/2019   Hyperlipidemia associated with type 2 diabetes mellitus ([place]) 04/24/2018   Right ankle swelling 05/13/2017   DM (diabetes mellitus) type II uncontrolled, periph vascular disorder ([place]) 04/08/2017   Essential hypertension 04/08/2017   Atherosclerosis of aorta ([place]) 04/08/2017   Abnormal auditory perception of both ears 10/01/2016   Bilateral impacted cerumen 10/01/2016   OSA (obstructive sleep apnea) 12/16/2015   DOE (dyspnea on exertion) 06/12/2013   Obesity (BMI 30-39.9) 04/24/2013   Chest pain, unspecified 01/22/2011   Abnormal stress test 01/22/2011   Frequent urination 01/22/2011    Chronic diastolic heart failure ([place]) 05/15/2009   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   RASH-NONVESICULAR 04/20/2009   CERVICAL STRAIN, WITH RADICULOPATHY 03/31/2009   CAROTID ARTERY DISEASE 01/12/2009   CERVICAL STRAIN 01/11/2009   LEG EDEMA, RIGHT 07/27/2008   HX, URINARY INFECTION 09/19/2006   Hyperlipidemia LDL goal <70 08/20/2006   PREGNANCY, ECTOPIC NEC W/INTRAUTERINE PRG 08/20/2006   FREQUENCY, URINARY 08/20/2006   Other specified abnormal findings of blood chemistry 08/20/2006   Personal history of venous thrombosis and embolism 08/20/2006    Immunization History  Administered Date(s) Administered   Fluad Quad(high Dose 65+) 12/04/2019   Influenza, High Dose Seasonal PF 12/22/2018   PFIZER Comirnaty(Gray Top)Covid-19 Tri-Sucrose Vaccine 06/08/2020   PFIZER(Purple Top)SARS-COV-2 Vaccination 03/06/2019, 03/27/2019, 11/10/2019   Pneumococcal Conjugate-13 04/24/2013   Pneumococcal Polysaccharide-23 08/07/2007, 12/04/2019   Td 03/03/2003   Tdap 11/06/2018   Zoster Recombinat (Shingrix) 11/06/2018, 05/11/2019   Zoster, Live 08/14/2007, 11/13/2007    Conditions to be addressed/monitored: CHF, CAD, HTN, HLD, DMII, and history of pulmonary embolism  Care Plan : General Pharmacy (Adult)  Updates made by Cherre Robins, PHARMD since 08/18/2020 12:00 AM     Problem: type 2 DM; Hyperlipidemia; CHF: h/o PE; obesity; OSA      Long-Range Goal: Long Range pharmacy goals for chronic conditions and medication management   Start Date: 08/17/2020  Priority: High  Note:   Current Barriers:  Unable to independently afford treatment regimen   Pharmacist Clinical Goal(s):  Over the next 180 days, patient will verbalize ability to afford treatment regimen maintain control of DM, BP and cholesterol as evidenced by maintaining goals listed below  adhere to prescribed medication regimen as evidenced by fill hisory  through collaboration with PharmD and provider.    Interventions: 1:1 collaboration with Carollee Herter, Alferd Apa, DO regarding development and update of comprehensive plan of care as evidenced by provider attestation and co-signature Inter-disciplinary care team collaboration (see longitudinal plan of care) Comprehensive medication review performed; medication list updated in electronic medical record   Hypertension Screening Controlled;  BP goal <130/80 Current regimen:  Diet and exercise management   Interventions: Discussed BP goal  Hyperlipidemia Controlled; LDL goal < 70 Current regimen:  Rosuvastatin 69m daily Praluent 764mevery 14 days  Flaxseed Oil  1059m daily CoEnzyme Q10 daily  Patient is is again receiving assistance with Praluent cost from [employer](she did miss 1 dose recently when foundation did not have funding for hyperlipidemia fund. Interventions: Discussed LDL goal Maintain cholesterol medication regimen.  Notify clinical pharmacist if any future issues regarding cost of Praulent or [employer]  Diabetes Controlled;  A1c goal <7% Current regimen:  Ozempic 0.558msubcutaneously once per week Tolerating Ozempic without nausea Receives Ozempic form [employer] assistance Exercise: doing either water aerobics or treadmill 5 days per week Diet: limiting serving sizes (easier with Ozempic) and sugar intake. She has also decreased intake of red meat.  Interventions: Discussed A1c  goal Reviewed home blood glucose goals  Fasting blood glucose goal (before meals) = 80 to 130 Blood glucose goal after a meal = less than 180 : Continue to check blood sugar once daily, document, and provide at future appointments Discussed possibly increasing Ozempic to 65m765mn future for more weight loss effects if able to tolerate.   Osteoporosis Screening / Bone Health Goal: reduce risk of fracture due to osteopenia/osteoporosis Current regimen:  Calcium/Vitamin 600m17m0 units twice daily  Vitamin D  400mg39mce daily Recent serum calcium elevated at 10.9 Interventions: Recommend lowering dose of calcium due to recent elevated serum calcium Decrease calcium supplement to take just 1 tablet daily  Continue current vitamin D supplementation  Medication management Current pharmacy: [facility] Interventions Comprehensive medication review performed. Continue current medication management strategy   Patient Goals/Self-Care Activities Over the next 180 days, patient will:  take medications as prescribed,  check glucose daily , document, and provide at future appointments Engage in dietary modifications by limiting CHO serving sizes;  Continue to get at least 150 minutes of exercise weekly  Follow Up Plan: Telephone follow up appointment with care management team member scheduled for:  6 months        Medication Assistance:  Eliquis obtained through [employer] medication assistance program.  Enrollment ends 02/18/2021; Ozempic obtained from [employer] 02/18/2021 and Praluent through [employer] date enrollment will end.  [id number]: [phone]/pharmacy #[id number] [address] [address]: [phone]- [address] [place]: [phone] [address] [address] [place]-[address]: [phone] Up:  Patient agrees to Care Plan and Follow-up.  Plan: Telephone follow up appointment with  care management team member scheduled for:  6 months  Gearldene Fiorenza Cherre RobinsmD Clinical Pharmacist [phone]

## 2020-08-18 NOTE — Patient Instructions (Signed)
Visit Information Sierra Ford It was a pleasure speaking with you today. Please feel free to contact me if you have any questions or concerns. Below is information regarding your health goals.   Keep up the good work exercising regularly and limiting your sugar intake!  Sierra Ford, PharmD Clinical Pharmacist [facility] [address] [place] [phone]  PATIENT GOALS:  Goals Addressed             This Visit's Progress    Chronic Care Management Pharmacy Care Plan       CARE PLAN ENTRY (see longitudinal plan of care for additional care plan information)  Current Barriers:  Chronic Disease Management support, education, and care coordination needs related to Hypertension, Hyperlipidemia/Atherosclerosis of Aorta, Diabetes, Heart Failure, History of PE, Osteoarthritis   Hypertension BP Readings from Last 3 Encounters:  12/10/19 123/75  12/04/19 112/64  11/10/19 120/64  Pharmacist Clinical Goal(s): Over the next 180 days, patient will work with PharmD and providers to maintain BP goal <130/80 Current regimen:  Diet and exercise management   Interventions: Discussed BP goal Patient self care activities - Over the next 180 days, patient will: Maintain BP less than 130/80  Hyperlipidemia Lab Results  Component Value Date/Time   LDLCALC 69 11/10/2019 11:22 AM   LDLDIRECT 95.9 10/06/2012 12:48 PM  Pharmacist Clinical Goal(s): Over the next 180 days, patient will work with PharmD and providers to maintain LDL goal < 70 Current regimen:  Rosuvastatin 10mg  daily Praluent 75mg  every 14 days  Flaxseed Oil 1000mg  daily CoEnzyme Q10 daily  Interventions: Discussed LDL goal Patient self care activities - Over the next 180 days, patient will: Maintain cholesterol medication regimen.  Notify clinical pharmacist if any future issues regarding cost of Praulent or [place]  Diabetes Lab Results  Component Value Date/Time   HGBA1C 6.5 (H) 11/10/2019 11:22  AM   HGBA1C 6.4 05/07/2019 09:32 AM   HGBA1C 6.7 12/28/2016 12:00 AM  Pharmacist Clinical Goal(s): Over the next 180 days, patient will work with PharmD and providers to maintain A1c goal <7% Current regimen:  Ozempic 0.5mg  subcutaneously once per week Interventions: Discussed A1c  goal Reviewed home blood glucose goals  Fasting blood glucose goal (before meals) = 80 to 130 Blood glucose goal after a meal = less than 180  Patient self care activities - Over the next 180 days, patient will: Check blood sugar once daily, document, and provide at future appointments Contact provider with any episodes of hypoglycemia  Osteopenia/Osteoporosis Screening Pharmacist Clinical Goal(s) Over the next 180 days, patient will work with PharmD and providers to reduce risk of fracture due to osteopenia/osteoporosis Current regimen:  Calcium/Vitamin 600mg /800 units twice daily  Vitamin D 400mg  twice daily Interventions: Recommend lowering dose of calcium due to recent labs that showed elevated serum calcium Discussed intake of [phone] units of vitamin D through supplementation  Patient self care activities - Over the next 180 days, patient will: Decrease calcium supplement to take just 1 tablet daily  Continue current vitamin D supplementation  Medication management Pharmacist Clinical Goal(s): Over the next 180 days, patient will work with PharmD and providers to maintain optimal medication adherence [facility] Interventions Comprehensive medication review performed. Continue current medication management strategy Patient self care activities - Over the next 180 days, patient will: Focus on medication adherence by filling and taking medications appropriately  Take medications as prescribed Report any questions or concerns to PharmD and/or provider(s)  Please see past updates related  to this goal by clicking on the "Past Updates" button in the selected goal            The patient verbalized understanding of instructions, educational materials, and care plan provided today and agreed to receive a mailed copy of patient instructions, educational materials, and care plan.   Telephone follow up appointment with care management team member scheduled for: 6 months

## 2020-08-25 ENCOUNTER — Other Ambulatory Visit: Payer: Self-pay | Admitting: *Deleted

## 2020-08-25 DIAGNOSIS — Z87891 Personal history of nicotine dependence: Secondary | ICD-10-CM

## 2020-08-30 ENCOUNTER — Other Ambulatory Visit: Payer: Self-pay | Admitting: Cardiology

## 2020-08-31 NOTE — Telephone Encounter (Signed)
47f, 86.2g, scr 0.83 08/11/20, lovw/hochrein 12/10/19

## 2020-09-04 DIAGNOSIS — R0600 Dyspnea, unspecified: Secondary | ICD-10-CM | POA: Diagnosis not present

## 2020-09-04 DIAGNOSIS — I2699 Other pulmonary embolism without acute cor pulmonale: Secondary | ICD-10-CM | POA: Diagnosis not present

## 2020-09-04 DIAGNOSIS — J9601 Acute respiratory failure with hypoxia: Secondary | ICD-10-CM | POA: Diagnosis not present

## 2020-09-04 DIAGNOSIS — G4733 Obstructive sleep apnea (adult) (pediatric): Secondary | ICD-10-CM | POA: Diagnosis not present

## 2020-09-04 DIAGNOSIS — I5032 Chronic diastolic (congestive) heart failure: Secondary | ICD-10-CM | POA: Diagnosis not present

## 2020-09-04 DIAGNOSIS — I2609 Other pulmonary embolism with acute cor pulmonale: Secondary | ICD-10-CM | POA: Diagnosis not present

## 2020-09-05 ENCOUNTER — Telehealth: Payer: Self-pay | Admitting: Pharmacist

## 2020-09-05 DIAGNOSIS — E119 Type 2 diabetes mellitus without complications: Secondary | ICD-10-CM | POA: Diagnosis not present

## 2020-09-05 DIAGNOSIS — I2699 Other pulmonary embolism without acute cor pulmonale: Secondary | ICD-10-CM | POA: Diagnosis not present

## 2020-09-05 DIAGNOSIS — Z20822 Contact with and (suspected) exposure to covid-19: Secondary | ICD-10-CM | POA: Diagnosis not present

## 2020-09-05 DIAGNOSIS — I509 Heart failure, unspecified: Secondary | ICD-10-CM | POA: Diagnosis not present

## 2020-09-05 NOTE — Chronic Care Management (AMB) (Signed)
    Chronic Care Management Pharmacy Assistant   Name: Sierra Ford  MRN: [id number] DOB: [birth year]  Reason for Encounter: General Patient Call  Recent office visits:  No visits noted   Recent consult visits:  No visits noted   Hospital visits:  None in previous 6 months  Medications: Outpatient Encounter Medications as of 09/05/2020  Medication Sig Note   apixaban (ELIQUIS) 5 MG TABS tablet TAKE 1 TABLET BY MOUTH TWICE DAILY    blood glucose meter kit and supplies KIT Dispense based on patient and insurance preference. Use up to four times daily as directed. (FOR ICD-9 250.00, 250.01).    CALCIUM-VITAMIN D PO Take 600 mg by mouth 2 (two) times daily.    cholecalciferol (VITAMIN D3) 10 MCG (400 UNIT) TABS tablet Take 400 Units by mouth 2 (two) times daily.    Coenzyme Q10 100 MG TABS Take 100 mg by mouth daily.    Flaxseed, Linseed, (FLAXSEED OIL PO) Take 1,000 mg by mouth daily.     furosemide (LASIX) 40 MG tablet TAKE 1 TABLET DAILY    Insulin Pen Needle (NOVOFINE PLUS PEN NEEDLE) 32G X 4 MM MISC As directed    Multiple Vitamin (MULTIVITAMIN) capsule Take 1 capsule by mouth daily.    Omega-3 Fatty Acids (FISH OIL PO) Take 1,000 mg by mouth daily.    POTASSIUM CHLORIDE PO Take 1 tablet by mouth as needed (muscle cramps).  01/27/2019: Pt states she takes when she takes furosemide to mitigate cramping   PRALUENT 75 MG/ML SOAJ INJECT 1 PEN INTO THE SKIN EVERY 14 DAYS    rosuvastatin (CRESTOR) 10 MG tablet TAKE 1 TABLET DAILY    Semaglutide,0.25 or 0.5MG/DOS, (OZEMPIC, 0.25 OR 0.5 MG/DOSE,) 2 MG/1.5ML SOPN Inject 0.5 mg into the skin once a week.    TRUE METRIX BLOOD GLUCOSE TEST test strip TEST  UP  TO FOUR TIMES DAILY AS DIRECTED    TRUEPLUS LANCETS 33G MISC TEST  UP TO FOUR TIMES DAILY AS DIRECTED    [DISCONTINUED] omega-3 acid ethyl esters (LOVAZA) 1 G capsule Take 1 g by mouth 3 (three) times daily.      [DISCONTINUED] pravastatin (PRAVACHOL) 20 MG tablet Take 20 mg by mouth  daily.      No facility-administered encounter medications on file as of 09/05/2020.   I spoke with [name] this morning and she is doing well. She recently had her calcium labs done prior to her last visit with CPP. She has decreased taking Calcium 600 mg to once daily rather than twice daily per CPP recommendations and has been doing so for the last three weeks. Although there are standing orders for a CMP and PTH she has not completed these as yet due to not being aware that they were required. If these labs are still needed, she requested that a care team member contact her to go over any instructions. She would also like to be able to have her lab work done closer to her home in [place] rather than commuting to [place] if at all possible.   [employer] CPA, CMA

## 2020-09-13 NOTE — Telephone Encounter (Signed)
Lab orders have been changed to the [facility] lab. Pt can walk-in and have them drawn.

## 2020-09-13 NOTE — Addendum Note (Signed)
Addended by: CREFT, Kristine Garbe L on: 09/13/2020 11:12 AM   Modules accepted: Orders

## 2020-09-13 NOTE — Telephone Encounter (Cosign Needed)
I spoke with Sierra Ford and informed her that she is able to have her labs drawn at [facility] lab. She will be completing these in the next few days. Patient had no other questions or concerns

## 2020-09-13 NOTE — Telephone Encounter (Signed)
FYI

## 2020-09-16 ENCOUNTER — Other Ambulatory Visit: Payer: 59

## 2020-09-16 ENCOUNTER — Other Ambulatory Visit (INDEPENDENT_AMBULATORY_CARE_PROVIDER_SITE_OTHER): Payer: Medicare PPO

## 2020-09-16 LAB — COMPREHENSIVE METABOLIC PANEL
ALT: 16 U/L (ref 0–35)
AST: 16 U/L (ref 0–37)
Albumin: 4 g/dL (ref 3.5–5.2)
Alkaline Phosphatase: 72 U/L (ref 39–117)
BUN: 10 mg/dL (ref 6–23)
CO2: 28 mEq/L (ref 19–32)
Calcium: 9.6 mg/dL (ref 8.4–10.5)
Chloride: 103 mEq/L (ref 96–112)
Creatinine, Ser: 0.74 mg/dL (ref 0.40–1.20)
GFR: 77.18 mL/min (ref >60.00)
Glucose, Bld: 97 mg/dL (ref 70–99)
Potassium: 3.9 mEq/L (ref 3.5–5.1)
Sodium: 140 mEq/L (ref 135–145)
Total Bilirubin: 0.8 mg/dL (ref 0.2–1.2)
Total Protein: 6.7 g/dL (ref 6.0–8.3)

## 2020-09-19 ENCOUNTER — Other Ambulatory Visit: Payer: Self-pay | Admitting: Cardiology

## 2020-09-19 LAB — PARATHYROID HORMONE, INTACT (NO CA): PTH: 16 pg/mL (ref 16–77)

## 2020-10-05 ENCOUNTER — Ambulatory Visit: Payer: 59

## 2020-10-05 ENCOUNTER — Telehealth: Payer: 59 | Admitting: Acute Care

## 2020-10-05 DIAGNOSIS — J9601 Acute respiratory failure with hypoxia: Secondary | ICD-10-CM | POA: Diagnosis not present

## 2020-10-05 DIAGNOSIS — I2699 Other pulmonary embolism without acute cor pulmonale: Secondary | ICD-10-CM | POA: Diagnosis not present

## 2020-10-05 DIAGNOSIS — G4733 Obstructive sleep apnea (adult) (pediatric): Secondary | ICD-10-CM | POA: Diagnosis not present

## 2020-10-05 DIAGNOSIS — R0600 Dyspnea, unspecified: Secondary | ICD-10-CM | POA: Diagnosis not present

## 2020-10-05 DIAGNOSIS — I5032 Chronic diastolic (congestive) heart failure: Secondary | ICD-10-CM | POA: Diagnosis not present

## 2020-10-05 DIAGNOSIS — I2609 Other pulmonary embolism with acute cor pulmonale: Secondary | ICD-10-CM | POA: Diagnosis not present

## 2020-10-11 ENCOUNTER — Encounter: Payer: Self-pay | Admitting: Pulmonary Disease

## 2020-10-11 ENCOUNTER — Ambulatory Visit (INDEPENDENT_AMBULATORY_CARE_PROVIDER_SITE_OTHER): Payer: Medicare PPO | Admitting: Pulmonary Disease

## 2020-10-11 ENCOUNTER — Other Ambulatory Visit: Payer: Self-pay

## 2020-10-11 VITALS — BP 120/78 | HR 78 | Temp 97.3°F | Ht 64.0 in | Wt 183.6 lb

## 2020-10-11 DIAGNOSIS — I2609 Other pulmonary embolism with acute cor pulmonale: Secondary | ICD-10-CM | POA: Diagnosis not present

## 2020-10-11 DIAGNOSIS — G4733 Obstructive sleep apnea (adult) (pediatric): Secondary | ICD-10-CM | POA: Diagnosis not present

## 2020-10-11 NOTE — Assessment & Plan Note (Signed)
She has come off daytime oxygen.  We will check nocturnal oximetry on CPAP/room air and hopefully she can come off nocturnal oxygen 2. Handicap sticker will be provided since she is unable to walk 200 feet without resting and needs a cane to ambulate

## 2020-10-11 NOTE — Assessment & Plan Note (Signed)
CPAP download was reviewed which shows excellent control of events on 10 cm with compliance more than 7 hours per night and minimal leak. CPAP is certainly helped improve her daytime somnolence and fatigue.  We will write a prescription for replacement CPAP which she can hopefully obtain in October

## 2020-10-11 NOTE — Patient Instructions (Signed)
ONO on CPAP/RA   Rx for CPAP 10 cm in october

## 2020-10-11 NOTE — Progress Notes (Signed)
   Subjective:    Patient ID: Sierra Ford, female    DOB: 03/27/1940, 80 y.o.   MRN: [id number]  HPI  80 yo woman  followed for Moderate OSA on CPAP  PMH- R LE DVT and PE 2006  treated with coumadin x 53yr 01/2019  acute PE with R heart strain, RLE DVT  Status post thrombolytic treatment , was discharged on oxygen   She is currently using nocturnal oxygen blended into CPAP.  Reports being rested in the mornings, no sleep pressure, denies daytime somnolence and fatigue.  Her CPAP machine is giving her an error message and she has been advised by DME for replacement.  She reports that humidifier is not working as well She ambulates with a cane and has come off daytime oxygen, would like a handicap sticker. Compliant with apixaban.    Significant tests/ events reviewed  HST 11/08/15 AHI 28/h   01/27/19 CTA chest PE >> Positive for acute PE with CT evidence of right heart strain (RV/LV Ratio = 1.7) consistent with at least submassive (intermediate risk)PE.    12/9 LE VAS duplex DVT >> Right: Findings consistent with acute deep vein thrombosis involving the right femoral vein, right popliteal vein, right posterior tibial veins, right peroneal veins, and right gastrocnemius veins. No cystic structure found in the popliteal fossa. Left: There is no evidence of deep vein thrombosis in the lower extremity.No cystic structure found in the popliteal fossa.   12/9 TTE >>  1. Severely dilated RV with severely reduced function. Septum is flattened in systole/diastole consistent with RV pressure overload. RVSP is moderately elevated   Review of Systems neg for any significant sore throat, dysphagia, itching, sneezing, nasal congestion or excess/ purulent secretions, fever, chills, sweats, unintended wt loss, pleuritic or exertional cp, hempoptysis, orthopnea pnd or change in chronic leg swelling. Also denies presyncope, palpitations, heartburn, abdominal pain, nausea, vomiting, diarrhea or change  in bowel or urinary habits, dysuria,hematuria, rash, arthralgias, visual complaints, headache, numbness weakness or ataxia.     Objective:   Physical Exam  Gen. Pleasant, obese, in no distress ENT - no lesions, no post nasal drip Neck: No JVD, no thyromegaly, no carotid bruits Lungs: no use of accessory muscles, no dullness to percussion, decreased without rales or rhonchi  Cardiovascular: Rhythm regular, heart sounds  normal, no murmurs or gallops, no peripheral edema Musculoskeletal: No deformities, no cyanosis or clubbing , no tremors        Assessment & Plan:

## 2020-10-19 ENCOUNTER — Telehealth: Payer: Self-pay

## 2020-10-19 NOTE — Telephone Encounter (Signed)
Pt was called. LVM was left advising Ozempic had arrived. Insulin has been placed in the frig near [place].

## 2020-10-20 DIAGNOSIS — R0683 Snoring: Secondary | ICD-10-CM | POA: Diagnosis not present

## 2020-10-20 DIAGNOSIS — G4730 Sleep apnea, unspecified: Secondary | ICD-10-CM | POA: Diagnosis not present

## 2020-10-27 ENCOUNTER — Telehealth: Payer: Self-pay | Admitting: Pulmonary Disease

## 2020-10-27 DIAGNOSIS — G4733 Obstructive sleep apnea (adult) (pediatric): Secondary | ICD-10-CM

## 2020-10-27 NOTE — Telephone Encounter (Signed)
ONO on CPAP/RA showed minimal desat , about 7 mins OK to dc O2 during sleep

## 2020-11-01 NOTE — Telephone Encounter (Signed)
Called pt and there was no answer-LMTCB °

## 2020-11-03 ENCOUNTER — Telehealth: Payer: Self-pay | Admitting: Cardiology

## 2020-11-03 ENCOUNTER — Telehealth: Payer: Self-pay | Admitting: Family Medicine

## 2020-11-03 DIAGNOSIS — M5459 Other low back pain: Secondary | ICD-10-CM | POA: Diagnosis not present

## 2020-11-03 DIAGNOSIS — M25512 Pain in left shoulder: Secondary | ICD-10-CM | POA: Diagnosis not present

## 2020-11-03 NOTE — Telephone Encounter (Signed)
   Pt c/o medication issue:  1. Name of Medication: PRALUENT 75 MG/ML SOAJ  2. How are you currently taking this medication (dosage and times per day)? INJECT 1 PEN INTO THE SKIN EVERY 14 DAYS  3. Are you having a reaction (difficulty breathing--STAT)?   4. What is your medication issue? Pt is due to take her praluent on Monday, but she is leaving on a cruise on Saturday, she wanted to know instead of bringing the injection to her trip if she can take her praluent tomorrow Friday, 3 days earlier than her regular schedule dose. She needs to know today, she said just to call her and if she cant answer the phone to leave her a message

## 2020-11-03 NOTE — Telephone Encounter (Signed)
Yes that is fine to take Praluent 3 days early, then resume normal dosing of every 14 days afterwards.

## 2020-11-03 NOTE — Telephone Encounter (Signed)
Pt advised. Pt will call cardio

## 2020-11-03 NOTE — Telephone Encounter (Signed)
Patient is leaving out of town on Friday 09/16 by airplane and since both of her shots ( Semaglutide,0.25 or 0.'5MG'$ /DOS, (OZEMPIC, 0.25 OR 0.5 MG/DOSE,) 2 MG/1.5ML SOPN  and PRALUENT 75 MG/ML SOAJ )have to be refrigerated she is calling to see if its ok for her to take them both Friday morning (they are due Monday/tuesday). She would like to get a call back letting her know if she can do that. She asked that if she does not answer the phone, to please leave a voicemail letter her know if she cant or can do it. Please advise.

## 2020-11-03 NOTE — Telephone Encounter (Signed)
LMTCB

## 2020-11-03 NOTE — Telephone Encounter (Signed)
ATC patient to go over ONO results, per DPR left detailed message with results and advised her to call back and let us know that she received message. Advised that once she calls back to let us know she is aware then we can D/C order for oxygen. Will wait to hear back from patient.

## 2020-11-04 NOTE — Telephone Encounter (Signed)
Patient called back regarding ONO results. I informed patient of Dr. Elsworth Soho recs and verbalized understanding. I have sent an order to D/C O2 at night and patient will continue to use CPAP. Nothing further needed.

## 2020-11-04 NOTE — Telephone Encounter (Signed)
Spoke to patient pharmacist's advice given.

## 2020-11-04 NOTE — Addendum Note (Signed)
Addended byCoralie Keens on: 11/04/2020 10:01 AM   Modules accepted: Orders

## 2020-11-04 NOTE — Telephone Encounter (Signed)
Closing message per triage protocol. Letter printed and placed upfront to be mailed to patient.   Nothing further needed at this time.

## 2020-11-05 DIAGNOSIS — G4733 Obstructive sleep apnea (adult) (pediatric): Secondary | ICD-10-CM | POA: Diagnosis not present

## 2020-11-05 DIAGNOSIS — J9601 Acute respiratory failure with hypoxia: Secondary | ICD-10-CM | POA: Diagnosis not present

## 2020-11-05 DIAGNOSIS — I2609 Other pulmonary embolism with acute cor pulmonale: Secondary | ICD-10-CM | POA: Diagnosis not present

## 2020-11-05 DIAGNOSIS — I2699 Other pulmonary embolism without acute cor pulmonale: Secondary | ICD-10-CM | POA: Diagnosis not present

## 2020-11-05 DIAGNOSIS — I5032 Chronic diastolic (congestive) heart failure: Secondary | ICD-10-CM | POA: Diagnosis not present

## 2020-11-05 DIAGNOSIS — R0600 Dyspnea, unspecified: Secondary | ICD-10-CM | POA: Diagnosis not present

## 2020-11-16 DIAGNOSIS — E119 Type 2 diabetes mellitus without complications: Secondary | ICD-10-CM | POA: Diagnosis not present

## 2020-11-16 DIAGNOSIS — Z20822 Contact with and (suspected) exposure to covid-19: Secondary | ICD-10-CM | POA: Diagnosis not present

## 2020-12-05 ENCOUNTER — Telehealth: Payer: Self-pay | Admitting: Pulmonary Disease

## 2020-12-05 DIAGNOSIS — I2699 Other pulmonary embolism without acute cor pulmonale: Secondary | ICD-10-CM | POA: Diagnosis not present

## 2020-12-05 DIAGNOSIS — I5032 Chronic diastolic (congestive) heart failure: Secondary | ICD-10-CM | POA: Diagnosis not present

## 2020-12-05 DIAGNOSIS — R0600 Dyspnea, unspecified: Secondary | ICD-10-CM | POA: Diagnosis not present

## 2020-12-05 DIAGNOSIS — G4733 Obstructive sleep apnea (adult) (pediatric): Secondary | ICD-10-CM | POA: Diagnosis not present

## 2020-12-05 DIAGNOSIS — I2609 Other pulmonary embolism with acute cor pulmonale: Secondary | ICD-10-CM | POA: Diagnosis not present

## 2020-12-05 DIAGNOSIS — J9601 Acute respiratory failure with hypoxia: Secondary | ICD-10-CM | POA: Diagnosis not present

## 2020-12-05 NOTE — Telephone Encounter (Signed)
Called and spoke with patient. She stated that she would like to receive a new CPAP machine. Her current machine is not working properly and has not been working properly for the past few days.   She called [facility] and they stated they needed a new order from [name] as long as her current machine was at least 79yrs old. I found her information in [place] and it looks she received her cpap back in 2017.   Her current pressure setting is 10cm.   RA, please advise if you are ok with [name] placing an order for her to get a new cpap. Thanks!

## 2020-12-05 NOTE — Telephone Encounter (Signed)
I have called and LM on VM for the pt to call us back.  

## 2020-12-06 ENCOUNTER — Other Ambulatory Visit (HOSPITAL_BASED_OUTPATIENT_CLINIC_OR_DEPARTMENT_OTHER): Payer: Self-pay

## 2020-12-06 ENCOUNTER — Ambulatory Visit: Payer: 59 | Attending: Internal Medicine

## 2020-12-06 DIAGNOSIS — Z23 Encounter for immunization: Secondary | ICD-10-CM

## 2020-12-06 MED ORDER — INFLUENZA VAC A&B SA ADJ QUAD 0.5 ML IM PRSY
PREFILLED_SYRINGE | INTRAMUSCULAR | 0 refills | Status: DC
  Filled 2020-12-06: qty 0.5, 1d supply, fill #0

## 2020-12-06 MED ORDER — PFIZER COVID-19 VAC BIVALENT 30 MCG/0.3ML IM SUSP
INTRAMUSCULAR | 0 refills | Status: DC
  Filled 2020-12-06: qty 0.3, 1d supply, fill #0

## 2020-12-06 NOTE — Telephone Encounter (Signed)
Rigoberto Noel, MD  [facility] Triage Pool 28 minutes ago (9:55 AM)   Faythe Ghee to write for new CPAP prescription 10 cm     Called and spoke with pt letting her know the info stated by Dr. Elsworth Soho and she verbalized understanding. Order has been placed. Nothing further needed.

## 2020-12-06 NOTE — Progress Notes (Signed)
   Covid-19 Vaccination Clinic  Name:  Sierra Ford    MRN: [id number] DOB: [birth year]  12/06/2020  Ms. [name] was observed post Covid-19 immunization for 15 minutes without incident. She was provided with Vaccine Information Sheet and instruction to access the V-Safe system.   Ms. [name] was instructed to call 911 with any severe reactions post vaccine: Difficulty breathing  Swelling of face and throat  A fast heartbeat  A bad rash all over body  Dizziness and weakness

## 2020-12-19 ENCOUNTER — Other Ambulatory Visit: Payer: Self-pay

## 2020-12-20 ENCOUNTER — Encounter: Payer: Self-pay | Admitting: Family Medicine

## 2020-12-20 ENCOUNTER — Encounter: Payer: 59 | Admitting: Family Medicine

## 2020-12-20 ENCOUNTER — Telehealth (INDEPENDENT_AMBULATORY_CARE_PROVIDER_SITE_OTHER): Payer: Medicare PPO | Admitting: Family Medicine

## 2020-12-20 DIAGNOSIS — J40 Bronchitis, not specified as acute or chronic: Secondary | ICD-10-CM

## 2020-12-20 MED ORDER — AZITHROMYCIN 250 MG PO TABS: ORAL_TABLET | ORAL | 0 refills | Status: DC

## 2020-12-20 NOTE — Progress Notes (Signed)
Virtual telephone visit    Virtual Visit via Telephone Note   This visit type was conducted due to national recommendations for restrictions regarding the COVID-19 Pandemic (e.g. social distancing) in an effort to limit this patient's exposure and mitigate transmission in our community. Due to her co-morbid illnesses, this patient is at least at moderate risk for complications without adequate follow up. This format is felt to be most appropriate for this patient at this time. The patient did not have access to video technology or had technical difficulties with video requiring transitioning to audio format only (telephone). Physical exam was limited to content and character of the telephone converstion. [name] was able to get the patient set up on a telephone visit.   Patient location: Home Patient and provider in visit Provider location: Office  I discussed the limitations of evaluation and management by telemedicine and the availability of in person appointments. The patient expressed understanding and agreed to proceed.   Visit Date: 12/20/2020  Today's healthcare provider: Ann Held, DO     Subjective:    Patient ID: Sierra Ford, female    DOB: Oct 18, 1940, 80 y.o.   MRN: [id number]  No chief complaint on file.   HPI Patient is in today for a telephone visit.   She complains of coughing since 12/18/2020. She also has sputum production that is clear and rhinorrhea. She think it may be bronchitis because her current symptoms remind her of previous bronchitis symptoms. She describes the cough as coming from the middle of her chest. She denies having any fevers or wheezing.    Past Medical History:  Diagnosis Date   Anxiety    Back pain    Chest pain    CLite with apical ischemia in 2006 - normal coronary arteries by [facility] in 12/2004;  Myoview 11/12:  Low risk stress nuclear study with a small, partially reversible apical defect most likely related  to apical thinning; cannot R/O very mild apical ischemia.  EF: 75%    Decreased hearing    Depression    Diabetes ([place])    DVT (deep venous thrombosis) ([place]) 2006   hx of   Ectopic pregnancy with intrauterine pregnancy    Hiatal hernia    Hyperlipidemia    PE (pulmonary embolism) 2006   hx of   Sleep apnea    CPAP     Past Surgical History:  Procedure Laterality Date   ABDOMINAL EXPLORATION SURGERY     BUNIONECTOMY     right   COLONOSCOPY  2009   ECTOPIC PREGNANCY SURGERY     FOOT SURGERY     TUBAL LIGATION      Family History  Problem Relation Age of Onset   Heart failure Mother        died from   Colon polyps Mother    Hypertension Mother    Sudden death Mother    Obesity Mother    Alcoholism Father    Diabetes Maternal Aunt        grandaughter   Diabetes Maternal Aunt    Colon cancer Neg Hx    Esophageal cancer Neg Hx    Stomach cancer Neg Hx     Social History   Socioeconomic History   Marital status: Divorced    Spouse name: Not on file   Number of children: Not on file   Years of education: Not on file   Highest education level: Not on file  Occupational History  Occupation: Retired Scientist, research (medical)  Tobacco Use   Smoking status: Former    Packs/day: 1.00    Years: 50.00    Pack years: 50.00    Types: Cigarettes    Quit date: 11/21/2009    Years since quitting: 11.0   Smokeless tobacco: Never  Vaping Use   Vaping Use: Never used  Substance and Sexual Activity   Alcohol use: Yes    Comment: rare 3 times a year per pt   Drug use: No   Sexual activity: Not Currently    Partners: Male  Other Topics Concern   Not on file  Social History Narrative   Regular exercise         Social Determinants of Health   Financial Resource Strain: Low Risk    Difficulty of Paying Living Expenses: Not hard at all  Food Insecurity: No Food Insecurity   Worried About Charity fundraiser in the Last Year: Never true   [place] in the Last Year: Never  true  Transportation Needs: No Transportation Needs   Lack of Transportation (Medical): No   Lack of Transportation (Non-Medical): No  Physical Activity: Sufficiently Active   Days of Exercise per Week: 4 days   Minutes of Exercise per Session: 60 min  Stress: No Stress Concern Present   Feeling of Stress : Only a little  Social Connections: Moderately Integrated   Frequency of Communication with Friends and Family: More than three times a week   Frequency of Social Gatherings with Friends and Family: More than three times a week   Attends Religious Services: More than 4 times per year   Active Member of [employer] or Organizations: Yes   Attends Archivist Meetings: 1 to 4 times per year   Marital Status: Widowed  Human resources officer Violence: Not At Risk   Fear of Current or Ex-Partner: No   Emotionally Abused: No   Physically Abused: No   Sexually Abused: No    Outpatient Medications Prior to Visit  Medication Sig Dispense Refill   apixaban (ELIQUIS) 5 MG TABS tablet TAKE 1 TABLET BY MOUTH TWICE DAILY 180 tablet 1   blood glucose meter kit and supplies KIT Dispense based on patient and insurance preference. Use up to four times daily as directed. (FOR ICD-9 250.00, 250.01). 1 each 0   CALCIUM-VITAMIN D PO Take 600 mg by mouth daily.     cholecalciferol (VITAMIN D3) 10 MCG (400 UNIT) TABS tablet Take 400 Units by mouth 2 (two) times daily.     Coenzyme Q10 100 MG TABS Take 100 mg by mouth daily.     Flaxseed, Linseed, (FLAXSEED OIL PO) Take 1,000 mg by mouth daily.      furosemide (LASIX) 40 MG tablet TAKE 1 TABLET DAILY 90 tablet 2   Insulin Pen Needle (NOVOFINE PLUS PEN NEEDLE) 32G X 4 MM MISC As directed 100 each 1   Multiple Vitamin (MULTIVITAMIN) capsule Take 1 capsule by mouth daily.     Omega-3 Fatty Acids (FISH OIL PO) Take 1,000 mg by mouth daily.     POTASSIUM CHLORIDE PO Take 1 tablet by mouth as needed (muscle cramps).      PRALUENT 75 MG/ML SOAJ INJECT 1 PEN INTO  THE SKIN EVERY 14 DAYS 2 mL 11   rosuvastatin (CRESTOR) 10 MG tablet TAKE 1 TABLET DAILY 90 tablet 2   Semaglutide,0.25 or 0.5MG/DOS, (OZEMPIC, 0.25 OR 0.5 MG/DOSE,) 2 MG/1.5ML SOPN Inject 0.5 mg into the skin  once a week. 4.5 mL 1   TRUE METRIX BLOOD GLUCOSE TEST test strip TEST  UP  TO FOUR TIMES DAILY AS DIRECTED 200 each 12   TRUEPLUS LANCETS 33G MISC TEST  UP TO FOUR TIMES DAILY AS DIRECTED 200 each 12   COVID-19 mRNA bivalent vaccine, Pfizer, (PFIZER COVID-19 VAC BIVALENT) injection Inject into the muscle. (Patient not taking: Reported on 12/20/2020) 0.3 mL 0   influenza vaccine adjuvanted (FLUAD) 0.5 ML injection Inject into the muscle. (Patient not taking: Reported on 12/20/2020) 0.5 mL 0   No facility-administered medications prior to visit.    No Known Allergies  Review of Systems  Constitutional:  Negative for fever and malaise/fatigue.  HENT:  Negative for congestion.        (+)rhinorrhea  Eyes:  Negative for blurred vision.  Respiratory:  Positive for cough and sputum production (clear). Negative for shortness of breath and wheezing.   Cardiovascular:  Negative for chest pain, palpitations and leg swelling.  Gastrointestinal:  Negative for vomiting.  Musculoskeletal:  Negative for back pain.  Skin:  Negative for rash.  Neurological:  Negative for loss of consciousness and headaches.      Objective:    Physical Exam Vitals and nursing note reviewed.  Constitutional:      Appearance: She is well-developed.  Neck:     Thyroid: No thyromegaly.     Vascular: No JVD.  Pulmonary:     Effort: Pulmonary effort is normal.  Neurological:     Mental Status: She is alert and oriented to person, place, and time.  Psychiatric:        Mood and Affect: Mood normal.        Behavior: Behavior normal.        Thought Content: Thought content normal.        Judgment: Judgment normal.    There were no vitals taken for this visit. Wt Readings from Last 3 Encounters:  10/11/20 183  lb 9.6 oz (83.3 kg)  08/15/20 190 lb (86.2 kg)  08/11/20 190 lb (86.2 kg)    Diabetic Foot Exam - Simple   No data filed    Lab Results  Component Value Date   WBC 5.3 01/31/2019   HGB 12.0 01/31/2019   HCT 36.2 01/31/2019   PLT 202 01/31/2019   GLUCOSE 97 09/16/2020   CHOL 201 (H) 08/11/2020   TRIG 74.0 08/11/2020   HDL 120.50 08/11/2020   LDLDIRECT 95.9 10/06/2012   LDLCALC 66 08/11/2020   ALT 16 09/16/2020   AST 16 09/16/2020   NA 140 09/16/2020   K 3.9 09/16/2020   CL 103 09/16/2020   CREATININE 0.74 09/16/2020   BUN 10 09/16/2020   CO2 28 09/16/2020   TSH 1.250 05/27/2017   INR 2.0 (H) 01/29/2019   HGBA1C 6.7 (H) 08/11/2020   MICROALBUR <0.7 08/11/2020    Lab Results  Component Value Date   TSH 1.250 05/27/2017   Lab Results  Component Value Date   WBC 5.3 01/31/2019   HGB 12.0 01/31/2019   HCT 36.2 01/31/2019   MCV 93.8 01/31/2019   PLT 202 01/31/2019   Lab Results  Component Value Date   NA 140 09/16/2020   K 3.9 09/16/2020   CO2 28 09/16/2020   GLUCOSE 97 09/16/2020   BUN 10 09/16/2020   CREATININE 0.74 09/16/2020   BILITOT 0.8 09/16/2020   ALKPHOS 72 09/16/2020   AST 16 09/16/2020   ALT 16 09/16/2020   PROT 6.7 09/16/2020  ALBUMIN 4.0 09/16/2020   CALCIUM 9.6 09/16/2020   ANIONGAP 11 01/31/2019   GFR 77.18 09/16/2020   Lab Results  Component Value Date   CHOL 201 (H) 08/11/2020   Lab Results  Component Value Date   HDL 120.50 08/11/2020   Lab Results  Component Value Date   LDLCALC 66 08/11/2020   Lab Results  Component Value Date   TRIG 74.0 08/11/2020   Lab Results  Component Value Date   CHOLHDL 2 08/11/2020   Lab Results  Component Value Date   HGBA1C 6.7 (H) 08/11/2020       Assessment & Plan:   Problem List Items Addressed This Visit       Unprioritized   Bronchitis - Primary    z pak mucinex for cough        Relevant Medications   azithromycin (ZITHROMAX Z-PAK) 250 MG tablet     Meds ordered  this encounter  Medications   azithromycin (ZITHROMAX Z-PAK) 250 MG tablet    Sig: As directed    Dispense:  6 each    Refill:  0     I discussed the assessment and treatment plan with the patient. The patient was provided an opportunity to ask questions and all were answered. The patient agreed with the plan and demonstrated an understanding of the instructions.   The patient was advised to call back or seek an in-person evaluation if the symptoms worsen or if the condition fails to improve as anticipated.  I,[name],acting as a Education administrator for [employer], DO.,have documented all relevant documentation on the behalf of Ann Held, DO,as directed by  Ann Held, DO while in the presence of Ann Held, DO.  I provided 20 minutes of non-face-to-face time during this encounter.   Ann Held, DO [place] at [employer] [phone] (phone) [phone] (fax)  [place]

## 2020-12-20 NOTE — Assessment & Plan Note (Addendum)
z pak mucinex for cough  Pt will get covid test and call back if + If no improvement --- come to office in person

## 2020-12-27 DIAGNOSIS — J9601 Acute respiratory failure with hypoxia: Secondary | ICD-10-CM | POA: Diagnosis not present

## 2020-12-27 DIAGNOSIS — I2699 Other pulmonary embolism without acute cor pulmonale: Secondary | ICD-10-CM | POA: Diagnosis not present

## 2020-12-27 DIAGNOSIS — G4733 Obstructive sleep apnea (adult) (pediatric): Secondary | ICD-10-CM | POA: Diagnosis not present

## 2020-12-27 DIAGNOSIS — I5032 Chronic diastolic (congestive) heart failure: Secondary | ICD-10-CM | POA: Diagnosis not present

## 2020-12-27 DIAGNOSIS — R0600 Dyspnea, unspecified: Secondary | ICD-10-CM | POA: Diagnosis not present

## 2020-12-27 DIAGNOSIS — I2609 Other pulmonary embolism with acute cor pulmonale: Secondary | ICD-10-CM | POA: Diagnosis not present

## 2020-12-28 ENCOUNTER — Telehealth: Payer: Self-pay | Admitting: Pulmonary Disease

## 2020-12-28 NOTE — Telephone Encounter (Signed)
Received CPAP set up confirmation fax from [facility]  Pt needs f/u here 01-27-21-03-27-21 [facility] for the pt to scheduled appt

## 2020-12-29 ENCOUNTER — Other Ambulatory Visit: Payer: Self-pay

## 2020-12-29 ENCOUNTER — Encounter: Payer: Self-pay | Admitting: Family Medicine

## 2020-12-29 ENCOUNTER — Ambulatory Visit (INDEPENDENT_AMBULATORY_CARE_PROVIDER_SITE_OTHER): Payer: Medicare PPO | Admitting: Family Medicine

## 2020-12-29 VITALS — BP 130/70 | HR 65 | Temp 97.7°F | Resp 20 | Ht 64.0 in | Wt 187.4 lb

## 2020-12-29 DIAGNOSIS — Z23 Encounter for immunization: Secondary | ICD-10-CM

## 2020-12-29 DIAGNOSIS — H6123 Impacted cerumen, bilateral: Secondary | ICD-10-CM

## 2020-12-29 NOTE — Assessment & Plan Note (Signed)
Irritated with no complications Pt has hearing test tomorrow

## 2020-12-29 NOTE — Patient Instructions (Signed)
Earwax Buildup, Adult ?The ears produce a substance called earwax that helps keep bacteria out of the ear and protects the skin in the ear canal. Occasionally, earwax can build up in the ear and cause discomfort or hearing loss. ?What are the causes? ?This condition is caused by a buildup of earwax. Ear canals are self-cleaning. Ear wax is made in the outer part of the ear canal and generally falls out in small amounts over time. ?When the self-cleaning mechanism is not working, earwax builds up and can cause decreased hearing and discomfort. Attempting to clean ears with cotton swabs can push the earwax deep into the ear canal and cause decreased hearing and pain. ?What increases the risk? ?This condition is more likely to develop in people who: ?Clean their ears often with cotton swabs. ?Pick at their ears. ?Use earplugs or in-ear headphones often, or wear hearing aids. ?The following factors may also make you more likely to develop this condition: ?Being female. ?Being of older age. ?Naturally producing more earwax. ?Having narrow ear canals. ?Having earwax that is overly thick or sticky. ?Having excess hair in the ear canal. ?Having eczema. ?Being dehydrated. ?What are the signs or symptoms? ?Symptoms of this condition include: ?Reduced or muffled hearing. ?A feeling of fullness in the ear or feeling that the ear is plugged. ?Fluid coming from the ear. ?Ear pain or an itchy ear. ?Ringing in the ear. ?Coughing. ?Balance problems. ?An obvious piece of earwax that can be seen inside the ear canal. ?How is this diagnosed? ?This condition may be diagnosed based on: ?Your symptoms. ?Your medical history. ?An ear exam. During the exam, your health care provider will look into your ear with an instrument called an otoscope. ?You may have tests, including a hearing test. ?How is this treated? ?This condition may be treated by: ?Using ear drops to soften the earwax. ?Having the earwax removed by a health care provider. The  health care provider may: ?Flush the ear with water. ?Use an instrument that has a loop on the end (curette). ?Use a suction device. ?Having surgery to remove the wax buildup. This may be done in severe cases. ?Follow these instructions at home: ? ?Take over-the-counter and prescription medicines only as told by your health care provider. ?Do not put any objects, including cotton swabs, into your ear. You can clean the opening of your ear canal with a washcloth or facial tissue. ?Follow instructions from your health care provider about cleaning your ears. Do not overclean your ears. ?Drink enough fluid to keep your urine pale yellow. This will help to thin the earwax. ?Keep all follow-up visits as told. If earwax builds up in your ears often or if you use hearing aids, consider seeing your health care provider for routine, preventive ear cleanings. Ask your health care provider how often you should schedule your cleanings. ?If you have hearing aids, clean them according to instructions from the manufacturer and your health care provider. ?Contact a health care provider if: ?You have ear pain. ?You develop a fever. ?You have pus or other fluid coming from your ear. ?You have hearing loss. ?You have ringing in your ears that does not go away. ?You feel like the room is spinning (vertigo). ?Your symptoms do not improve with treatment. ?Get help right away if: ?You have bleeding from the affected ear. ?You have severe ear pain. ?Summary ?Earwax can build up in the ear and cause discomfort or hearing loss. ?The most common symptoms of this condition include   reduced or muffled hearing, a feeling of fullness in the ear, or feeling that the ear is plugged. ?This condition may be diagnosed based on your symptoms, your medical history, and an ear exam. ?This condition may be treated by using ear drops to soften the earwax or by having the earwax removed by a health care provider. ?Do not put any objects, including cotton  swabs, into your ear. You can clean the opening of your ear canal with a washcloth or facial tissue. ?This information is not intended to replace advice given to you by your health care provider. Make sure you discuss any questions you have with your health care provider. ?Document Revised: 05/26/2019 Document Reviewed: 05/26/2019 ?Elsevier Patient Education ? 2022 Elsevier Inc. ? ?

## 2020-12-29 NOTE — Progress Notes (Signed)
Subjective:   By signing my name below, I, Sierra Ford, attest that this documentation has been prepared under the direction and in the presence of Dr. Roma Schanz, DO. 12/29/2020    Patient ID: Sierra Ford, female    DOB: 1940-11-01, 80 y.o.   MRN: [id number]  Chief Complaint  Patient presents with   Cerumen Impaction    HPI Patient is in today for a office visit.   Cerumen-She complains of cerumen build up in both ears. She was told by her hearing specialist that she needs to clear her ears before their appointment.  Immunizations- She is receiving a flu vaccine during this visit.    Past Medical History:  Diagnosis Date   Anxiety    Back pain    Chest pain    CLite with apical ischemia in 2006 - normal coronary arteries by [facility] in 12/2004;  Myoview 11/12:  Low risk stress nuclear study with a small, partially reversible apical defect most likely related to apical thinning; cannot R/O very mild apical ischemia.  EF: 75%    Decreased hearing    Depression    Diabetes ([place])    DVT (deep venous thrombosis) ([place]) 2006   hx of   Ectopic pregnancy with intrauterine pregnancy    Hiatal hernia    Hyperlipidemia    PE (pulmonary embolism) 2006   hx of   Sleep apnea    CPAP     Past Surgical History:  Procedure Laterality Date   ABDOMINAL EXPLORATION SURGERY     BUNIONECTOMY     right   COLONOSCOPY  2009   ECTOPIC PREGNANCY SURGERY     FOOT SURGERY     TUBAL LIGATION      Family History  Problem Relation Age of Onset   Heart failure Mother        died from   Colon polyps Mother    Hypertension Mother    Sudden death Mother    Obesity Mother    Alcoholism Father    Diabetes Maternal Aunt        grandaughter   Diabetes Maternal Aunt    Colon cancer Neg Hx    Esophageal cancer Neg Hx    Stomach cancer Neg Hx     Social History   Socioeconomic History   Marital status: Divorced    Spouse name: Not on file   Number of children: Not on file    Years of education: Not on file   Highest education level: Not on file  Occupational History   Occupation: Retired Scientist, research (medical)  Tobacco Use   Smoking status: Former    Packs/day: 1.00    Years: 50.00    Pack years: 50.00    Types: Cigarettes    Quit date: 11/21/2009    Years since quitting: 11.1   Smokeless tobacco: Never  Vaping Use   Vaping Use: Never used  Substance and Sexual Activity   Alcohol use: Yes    Comment: rare 3 times a year per pt   Drug use: No   Sexual activity: Not Currently    Partners: Male  Other Topics Concern   Not on file  Social History Narrative   Regular exercise         Social Determinants of Health   Financial Resource Strain: Low Risk    Difficulty of Paying Living Expenses: Not hard at all  Food Insecurity: No Food Insecurity   Worried About Charity fundraiser in  the Last Year: Never true   Ran Out of Food in the Last Year: Never true  Transportation Needs: No Transportation Needs   Lack of Transportation (Medical): No   Lack of Transportation (Non-Medical): No  Physical Activity: Sufficiently Active   Days of Exercise per Week: 4 days   Minutes of Exercise per Session: 60 min  Stress: No Stress Concern Present   Feeling of Stress : Only a little  Social Connections: Moderately Integrated   Frequency of Communication with Friends and Family: More than three times a week   Frequency of Social Gatherings with Friends and Family: More than three times a week   Attends Religious Services: More than 4 times per year   Active Member of [employer] or Organizations: Yes   Attends Archivist Meetings: 1 to 4 times per year   Marital Status: Widowed  Human resources officer Violence: Not At Risk   Fear of Current or Ex-Partner: No   Emotionally Abused: No   Physically Abused: No   Sexually Abused: No    Outpatient Medications Prior to Visit  Medication Sig Dispense Refill   apixaban (ELIQUIS) 5 MG TABS tablet TAKE 1 TABLET BY MOUTH TWICE  DAILY 180 tablet 1   azithromycin (ZITHROMAX Z-PAK) 250 MG tablet As directed 6 each 0   blood glucose meter kit and supplies KIT Dispense based on patient and insurance preference. Use up to four times daily as directed. (FOR ICD-9 250.00, 250.01). 1 each 0   CALCIUM-VITAMIN D PO Take 600 mg by mouth daily.     cholecalciferol (VITAMIN D3) 10 MCG (400 UNIT) TABS tablet Take 400 Units by mouth 2 (two) times daily.     Coenzyme Q10 100 MG TABS Take 100 mg by mouth daily.     Flaxseed, Linseed, (FLAXSEED OIL PO) Take 1,000 mg by mouth daily.      furosemide (LASIX) 40 MG tablet TAKE 1 TABLET DAILY 90 tablet 2   Insulin Pen Needle (NOVOFINE PLUS PEN NEEDLE) 32G X 4 MM MISC As directed 100 each 1   Multiple Vitamin (MULTIVITAMIN) capsule Take 1 capsule by mouth daily.     Omega-3 Fatty Acids (FISH OIL PO) Take 1,000 mg by mouth daily.     POTASSIUM CHLORIDE PO Take 1 tablet by mouth as needed (muscle cramps).      PRALUENT 75 MG/ML SOAJ INJECT 1 PEN INTO THE SKIN EVERY 14 DAYS 2 mL 11   rosuvastatin (CRESTOR) 10 MG tablet TAKE 1 TABLET DAILY 90 tablet 2   Semaglutide,0.25 or 0.5MG/DOS, (OZEMPIC, 0.25 OR 0.5 MG/DOSE,) 2 MG/1.5ML SOPN Inject 0.5 mg into the skin once a week. 4.5 mL 1   TRUE METRIX BLOOD GLUCOSE TEST test strip TEST  UP  TO FOUR TIMES DAILY AS DIRECTED 200 each 12   TRUEPLUS LANCETS 33G MISC TEST  UP TO FOUR TIMES DAILY AS DIRECTED 200 each 12   No facility-administered medications prior to visit.    No Known Allergies  Review of Systems  Constitutional:  Negative for fever and malaise/fatigue.  HENT:  Positive for hearing loss. Negative for congestion.        (+)cerumen build up in bilateral ears  Eyes:  Negative for blurred vision.  Respiratory:  Negative for shortness of breath.   Cardiovascular:  Negative for chest pain, palpitations and leg swelling.  Gastrointestinal:  Negative for abdominal pain, blood in stool and nausea.  Genitourinary:  Negative for dysuria and  frequency.  Musculoskeletal:  Negative for  falls.  Skin:  Negative for rash.  Neurological:  Negative for dizziness, loss of consciousness and headaches.  Endo/Heme/Allergies:  Negative for environmental allergies.  Psychiatric/Behavioral:  Negative for depression. The patient is not nervous/anxious.       Objective:    Physical Exam Vitals and nursing note reviewed.  Constitutional:      General: She is not in acute distress.    Appearance: Normal appearance. She is not ill-appearing.  HENT:     Head: Normocephalic and atraumatic.     Right Ear: Tympanic membrane, ear canal and external ear normal. There is impacted cerumen.     Left Ear: Tympanic membrane, ear canal and external ear normal. There is impacted cerumen.     Ears:     Comments: Cerumen was irrigated and cleared after consent  Pt tolerated procedure well   Tmi b/l  Eyes:     Extraocular Movements: Extraocular movements intact.     Pupils: Pupils are equal, round, and reactive to light.  Neurological:     Mental Status: She is alert and oriented to person, place, and time.  Psychiatric:        Behavior: Behavior normal.        Judgment: Judgment normal.    BP 130/70 (BP Location: Right Arm, Patient Position: Sitting, Cuff Size: Large)   Pulse 65   Temp 97.7 F (36.5 C) (Oral)   Resp 20   Ht _0  (1.626 m)   Wt 187 lb 6.4 oz (85 kg)   SpO2 96%   BMI 32.17 kg/m  Wt Readings from Last 3 Encounters:  12/29/20 187 lb 6.4 oz (85 kg)  10/11/20 183 lb 9.6 oz (83.3 kg)  08/15/20 190 lb (86.2 kg)    Diabetic Foot Exam - Simple   No data filed    Lab Results  Component Value Date   WBC 5.3 01/31/2019   HGB 12.0 01/31/2019   HCT 36.2 01/31/2019   PLT 202 01/31/2019   GLUCOSE 97 09/16/2020   CHOL 201 (H) 08/11/2020   TRIG 74.0 08/11/2020   HDL 120.50 08/11/2020   LDLDIRECT 95.9 10/06/2012   LDLCALC 66 08/11/2020   ALT 16 09/16/2020   AST 16 09/16/2020   NA 140 09/16/2020   K 3.9 09/16/2020   CL  103 09/16/2020   CREATININE 0.74 09/16/2020   BUN 10 09/16/2020   CO2 28 09/16/2020   TSH 1.250 05/27/2017   INR 2.0 (H) 01/29/2019   HGBA1C 6.7 (H) 08/11/2020   MICROALBUR <0.7 08/11/2020    Lab Results  Component Value Date   TSH 1.250 05/27/2017   Lab Results  Component Value Date   WBC 5.3 01/31/2019   HGB 12.0 01/31/2019   HCT 36.2 01/31/2019   MCV 93.8 01/31/2019   PLT 202 01/31/2019   Lab Results  Component Value Date   NA 140 09/16/2020   K 3.9 09/16/2020   CO2 28 09/16/2020   GLUCOSE 97 09/16/2020   BUN 10 09/16/2020   CREATININE 0.74 09/16/2020   BILITOT 0.8 09/16/2020   ALKPHOS 72 09/16/2020   AST 16 09/16/2020   ALT 16 09/16/2020   PROT 6.7 09/16/2020   ALBUMIN 4.0 09/16/2020   CALCIUM 9.6 09/16/2020   ANIONGAP 11 01/31/2019   GFR 77.18 09/16/2020   Lab Results  Component Value Date   CHOL 201 (H) 08/11/2020   Lab Results  Component Value Date   HDL 120.50 08/11/2020   Lab Results  Component Value Date  LDLCALC 66 08/11/2020   Lab Results  Component Value Date   TRIG 74.0 08/11/2020   Lab Results  Component Value Date   CHOLHDL 2 08/11/2020   Lab Results  Component Value Date   HGBA1C 6.7 (H) 08/11/2020       Assessment & Plan:   Problem List Items Addressed This Visit       Unprioritized   Bilateral impacted cerumen    Irritated with no complications Pt has hearing test tomorrow      Other Visit Diagnoses     Need for influenza vaccination    -  Primary   Relevant Orders   Flu Vaccine QUAD High Dose(Fluad) (Completed)        No orders of the defined types were placed in this encounter.   I, Dr. Roma Schanz, DO, personally preformed the services described in this documentation.  All medical record entries made by the scribe were at my direction and in my presence.  I have reviewed the chart and discharge instructions (if applicable) and agree that the record reflects my personal performance and is accurate  and complete. 12/29/2020   I,Sierra Ford,acting as a scribe for Ann Held, DO.,have documented all relevant documentation on the behalf of Ann Held, DO,as directed by  Ann Held, DO while in the presence of Ann Held, DO.   Ann Held, DO

## 2021-01-02 ENCOUNTER — Telehealth: Payer: Self-pay | Admitting: *Deleted

## 2021-01-02 NOTE — Telephone Encounter (Signed)
Patient assistance forms faxed for eliquis.

## 2021-01-04 NOTE — Telephone Encounter (Signed)
Appt was scheduled.

## 2021-01-24 ENCOUNTER — Telehealth: Payer: Medicare PPO

## 2021-01-24 ENCOUNTER — Telehealth: Payer: Self-pay | Admitting: Pharmacist

## 2021-01-24 NOTE — Telephone Encounter (Signed)
Attempted to contact patient for Chronic Care Management follow up phone visit. Unable to reach patient. LM on VM with my contact number [phone]

## 2021-01-25 DIAGNOSIS — M25512 Pain in left shoulder: Secondary | ICD-10-CM | POA: Diagnosis not present

## 2021-01-26 ENCOUNTER — Encounter: Payer: Self-pay | Admitting: Family Medicine

## 2021-01-26 ENCOUNTER — Ambulatory Visit: Payer: Medicare PPO | Admitting: Pharmacist

## 2021-01-26 DIAGNOSIS — E785 Hyperlipidemia, unspecified: Secondary | ICD-10-CM

## 2021-01-26 DIAGNOSIS — I779 Disorder of arteries and arterioles, unspecified: Secondary | ICD-10-CM

## 2021-01-26 DIAGNOSIS — E1165 Type 2 diabetes mellitus with hyperglycemia: Secondary | ICD-10-CM

## 2021-01-26 DIAGNOSIS — I5032 Chronic diastolic (congestive) heart failure: Secondary | ICD-10-CM

## 2021-01-26 NOTE — Chronic Care Management (AMB) (Signed)
Chronic Care Management Pharmacy Note  01/26/2021 Name:  Sierra Ford MRN:  [id number] DOB:  1940/08/29  Summary: Due to reapply for patient assistance program for 2023 - patient has been approved for [employer] for [place] (per patient)  Pending approval for ELiquis  Recommendations/Changes made from today's visit: Mailed application for Ozempic patient assistance program   Plan: F/U in 1 month to check 2023 patient assistance program status for Eliquis and Ozempic  Subjective: Sierra Ford is an 80 y.o. year old female who is a primary patient of Ann Held, DO.  The CCM team was consulted for assistance with disease management and care coordination needs.    Engaged with patient by telephone for follow up visit in response to provider referral for pharmacy case management and/or care coordination services.   Consent to Services:  The patient was given information about Chronic Care Management services, agreed to services, and gave verbal consent prior to initiation of services.  Please see initial visit note for detailed documentation.   Patient Care Team: Carollee Herter, Alferd Apa, DO as PCP - General Minus Breeding, MD as PCP - Cardiology (Cardiology) Maisie Fus, MD as Consulting Physician (Obstetrics and Gynecology) Collene Gobble, MD as Consulting Physician (Pulmonary Disease) Cherre Robins, RPH-CPP (Pharmacist)  Recent office visits: 12/29/2020 - Fam Med (Dr Carollee Herter) Office visit for cerumen impaction and immunization. Ears irragated. Received flu vaccine.  12/20/2020 - Fam Med (Dr Carollee Herter) Video Visit for Bronchitis. Prescribed azithromycin for 5 days. OTC mucinex as needed for cough 08/11/2020 - PCP (Dr Etter Sjogren) 3 month f/u; No med changes  Recent consult visits: 11/03/2020 - Ortho (Dr Gladstone Lighter) left should pain and low back pain 10/11/2020 - Pulm (Dr Elsworth Soho) F/U OSA. Off supplemental O2 during day. Using CPAP at night. Provided  handicapped sticker.  06/24/2020 - ortho (Dr Lawernce Pitts) seen for pain of right shoulder joint 06/23/2020 - ortho (Dr Caralyn Guile) seen for bilateral carpel tunnel syndrome 06/13/2020 - ortho (Dr Nelva Bush) seen for bilateral carpel tunnel syndrome   Hospital visits: None in previous 6 months  Objective:  Lab Results  Component Value Date   CREATININE 0.74 09/16/2020   CREATININE 0.83 08/11/2020   CREATININE 0.84 05/10/2020    Lab Results  Component Value Date   HGBA1C 6.7 (H) 08/11/2020   Last diabetic Eye exam:  Lab Results  Component Value Date/Time   HMDIABEYEEXA No Retinopathy 11/05/2019 12:00 AM    Last diabetic Foot exam: No results found for: HMDIABFOOTEX      Component Value Date/Time   CHOL 201 (H) 08/11/2020 1114   CHOL 209 (H) 08/26/2018 1018   TRIG 74.0 08/11/2020 1114   HDL 120.50 08/11/2020 1114   HDL 101 08/26/2018 1018   CHOLHDL 2 08/11/2020 1114   VLDL 14.8 08/11/2020 1114   LDLCALC 66 08/11/2020 1114   LDLCALC 69 11/10/2019 1122   LDLDIRECT 95.9 10/06/2012 1248    Hepatic Function Latest Ref Rng & Units 09/16/2020 08/11/2020 05/10/2020  Total Protein 6.0 - 8.3 g/dL 6.7 7.0 7.0  Albumin 3.5 - 5.2 g/dL 4.0 4.6 4.6  AST 0 - 37 U/L _0 ALT 0 - 35 U/L _1 Alk Phosphatase 39 - 117 U/L 72 76 78  Total Bilirubin 0.2 - 1.2 mg/dL 0.8 1.3(H) 1.1  Bilirubin, Direct 0.00 - 0.40 mg/dL - - -    Lab Results  Component Value Date/Time   TSH 1.250 05/27/2017 12:24 PM  TSH 1.27 12/28/2016 12:00 AM   TSH 1.00 01/03/2015 12:00 AM   TSH 1.61 08/07/2007 09:38 AM   FREET4 1.05 05/27/2017 12:24 PM    CBC Latest Ref Rng & Units 01/31/2019 01/30/2019 01/29/2019  WBC 4.0 - 10.5 K/uL 5.3 5.6 7.6  Hemoglobin 12.0 - 15.0 g/dL 12.0 12.9 13.9  Hematocrit 36.0 - 46.0 % 36.2 38.2 41.3  Platelets 150 - 400 K/uL 202 175 183    Lab Results  Component Value Date/Time   VD25OH 39.1 01/28/2018 09:24 AM   VD25OH 45.9 05/27/2017 12:24 PM    Clinical ASCVD: Yes  The  ASCVD Risk score (Arnett DK, et al., 2019) failed to calculate for the following reasons:   The valid HDL cholesterol range is 20 to 100 mg/dL     Social History   Tobacco Use  Smoking Status Former   Packs/day: 1.00   Years: 50.00   Pack years: 50.00   Types: Cigarettes   Quit date: 11/21/2009   Years since quitting: 11.1  Smokeless Tobacco Never   BP Readings from Last 3 Encounters:  12/29/20 130/70  10/11/20 120/78  08/11/20 130/72   Pulse Readings from Last 3 Encounters:  12/29/20 65  10/11/20 78  08/11/20 82   Wt Readings from Last 3 Encounters:  12/29/20 187 lb 6.4 oz (85 kg)  10/11/20 183 lb 9.6 oz (83.3 kg)  08/15/20 190 lb (86.2 kg)    Assessment: Review of patient past medical history, allergies, medications, health status, including review of consultants reports, laboratory and other test data, was performed as part of comprehensive evaluation and provision of chronic care management services.   SDOH:  (Social Determinants of Health) assessments and interventions performed:  SDOH Interventions    Flowsheet Row Most Recent Value  SDOH Interventions   Financial Strain Interventions Other (Comment)  [assiting patient with reapplication for patient assistance for Elquis, Ozempic and [place]  No Known Allergies  Medications Reviewed Today     Reviewed by Cherre Robins, RPH-CPP (Pharmacist) on 01/26/21 at 0904  Med List Status: <None>   Medication Order Taking? Sig Documenting Provider Last Dose Status Informant  apixaban (ELIQUIS) 5 MG TABS tablet [id number] Yes TAKE 1 TABLET BY MOUTH TWICE DAILY Minus Breeding, MD Taking Active   blood glucose meter kit and supplies KIT [id number] Yes Dispense based on patient and insurance preference. Use up to four times daily as directed. (FOR ICD-9 250.00, 250.01). Ann Held, DO Taking Active Self  CALCIUM-VITAMIN D PO [id number] Yes Take 600 mg by mouth daily. [provider]  Taking Active Self  cholecalciferol (VITAMIN D3) 10 MCG (400 UNIT) TABS tablet [id number] Yes Take 400 Units by mouth daily. [provider] Taking Active Self  Coenzyme Q10 100 MG TABS [id number] Yes Take 100 mg by mouth daily. [provider] Taking Active Self  Flaxseed, Linseed, (FLAXSEED OIL PO) [id number] Yes Take 1,000 mg by mouth daily.  [provider] Taking Active Self  furosemide (LASIX) 40 MG tablet [id number] Yes TAKE 1 TABLET DAILY  Patient taking differently: Take 40 mg by mouth daily as needed.   Minus Breeding, MD Taking Active   Insulin Pen Needle (NOVOFINE PLUS PEN NEEDLE) 32G X 4 MM MISC [id number] Yes As directed Ann Held, DO Taking Active   Multiple Vitamin (MULTIVITAMIN) capsule [id number] Yes Take 1 capsule by mouth daily. [provider] Taking Active     Discontinued 05/07/11 1124 (  Discontinued by provider) Omega-3 Fatty Acids (FISH OIL PO) [id number] Yes Take 1,000 mg by mouth daily. [provider] Taking Active Self  POTASSIUM CHLORIDE PO [id number] Yes Take 1 tablet by mouth as needed (muscle cramps).  [provider] Taking Active Self           Med Note Hali Marry, SARA E   Tue Jan 27, 2019  4:18 PM) Pt states she takes when she takes furosemide to mitigate cramping  PRALUENT 75 MG/ML SOAJ [id number] Yes INJECT 1 PEN INTO THE SKIN EVERY 14 DAYS Minus Breeding, MD Taking Active     Discontinued 05/07/11 1124 (Change in therapy) rosuvastatin (CRESTOR) 10 MG tablet [id number] Yes TAKE 1 TABLET DAILY Hochrein, Jeneen Rinks, MD Taking Active   Semaglutide,0.25 or 0.5MG/DOS, (OZEMPIC, 0.25 OR 0.5 MG/DOSE,) 2 MG/1.5ML SOPN [id number] Yes Inject 0.5 mg into the skin once a week. Carollee Herter, Kendrick Fries R, DO Taking Active   TRUE METRIX BLOOD GLUCOSE TEST test strip [id number] Yes TEST  UP  TO FOUR TIMES DAILY AS DIRECTED Ann Held, DO Taking Active Self  TRUEPLUS LANCETS 33G MISC [id number] Yes TEST  UP TO FOUR TIMES DAILY  AS DIRECTED Ann Held, DO Taking Active Self            Patient Active Problem List   Diagnosis Date Noted   Bronchitis 12/20/2020   Pulmonary emboli ([place]) 01/27/2019   Educated about COVID-19 virus infection 01/26/2019   Hyperlipidemia associated with type 2 diabetes mellitus ([address]) 04/24/2018   Right ankle swelling 05/13/2017   DM (diabetes mellitus) type II uncontrolled, periph vascular disorder 04/08/2017   Essential hypertension 04/08/2017   Atherosclerosis of aorta ([place]) 04/08/2017   Abnormal auditory perception of both ears 10/01/2016   Bilateral impacted cerumen 10/01/2016   OSA (obstructive sleep apnea) 12/16/2015   DOE (dyspnea on exertion) 06/12/2013   Obesity (BMI 30-39.9) 04/24/2013   Chest pain, unspecified 01/22/2011   Abnormal stress test 01/22/2011   Frequent urination 01/22/2011   Chronic diastolic heart failure ([place]) 05/15/2009   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   RASH-NONVESICULAR 04/20/2009   CERVICAL STRAIN, WITH RADICULOPATHY 03/31/2009   CAROTID ARTERY DISEASE 01/12/2009   CERVICAL STRAIN 01/11/2009   LEG EDEMA, RIGHT 07/27/2008   HX, URINARY INFECTION 09/19/2006   Hyperlipidemia LDL goal <70 08/20/2006   PREGNANCY, ECTOPIC NEC W/INTRAUTERINE PRG 08/20/2006   FREQUENCY, URINARY 08/20/2006   Other specified abnormal findings of blood chemistry 08/20/2006   Personal history of venous thrombosis and embolism 08/20/2006    Immunization History  Administered Date(s) Administered   Fluad Quad(high Dose 65+) 12/04/2019, 12/29/2020   Influenza, High Dose Seasonal PF 12/22/2018   PFIZER Comirnaty(Gray Top)Covid-19 Tri-Sucrose Vaccine 06/08/2020   PFIZER(Purple Top)SARS-COV-2 Vaccination 03/06/2019, 03/27/2019, 11/10/2019   Pfizer Covid-19 Vaccine Bivalent Booster 76yr & up 12/06/2020   Pneumococcal Conjugate-13 04/24/2013   Pneumococcal Polysaccharide-23 08/07/2007, 12/04/2019   Td 03/03/2003   Tdap 11/06/2018   Zoster  Recombinat (Shingrix) 11/06/2018, 05/11/2019   Zoster, Live 08/14/2007, 11/13/2007    Conditions to be addressed/monitored: CHF, CAD, HTN, HLD, DMII, and history of pulmonary embolism  Care Plan : General Pharmacy (Adult)  Updates made by ECherre Robins RPH-CPP since 01/26/2021 12:00 AM     Problem: type 2 DM; Hyperlipidemia; CHF: h/o PE; obesity; OSA      Long-Range Goal: Long Range pharmacy goals for chronic conditions and medication management   Start Date: 08/17/2020  This Visit's Progress: On track  Priority: High  Note:  Current Barriers:  Unable to independently afford treatment regimen   Pharmacist Clinical Goal(s):  Over the next 180 days, patient will verbalize ability to afford treatment regimen maintain control of DM, BP and cholesterol as evidenced by maintaining goals listed below  adhere to prescribed medication regimen as evidenced by fill hisory  through collaboration with PharmD and provider.   Interventions: 1:1 collaboration with Carollee Herter, Alferd Apa, DO regarding development and update of comprehensive plan of care as evidenced by provider attestation and co-signature Inter-disciplinary care team collaboration (see longitudinal plan of care) Comprehensive medication review performed; medication list updated in electronic medical record   Hypertension Screening Controlled;  BP goal <130/80 Current regimen:  Diet and exercise management   Interventions: Discussed BP goal  Hyperlipidemia Controlled; LDL goal < 70 Current regimen:  Rosuvastatin 68m daily Praluent 723mevery 14 days  Flaxseed Oil 100057maily CoEnzyme Q10 daily  Patient is is again receiving assistance with Praluent cost from [employer] she reports that she has received notification that she was approved for 2023.  Interventions: Discussed LDL goal Maintain cholesterol medication regimen.  Notify clinical pharmacist if any future issues regarding cost of Praulent  or [employer]  Diabetes Controlled;  A1c goal <7% Current regimen:  Ozempic 0.5mg64mbcutaneously once per week Tolerating Ozempic without nausea Receives [employer]emPilgrim's Prideient assistance - she states she has about 2 or 3 month of Ozempic on hand.  Exercise: doing either water aerobics or treadmill 5 days per week Diet: limiting serving sizes (easier with Ozempic) and sugar intake. She has also decreased intake of red meat.  Interventions: Discussed A1c  goal Reviewed home blood glucose goals  Fasting blood glucose goal (before meals) = 80 to 130 Blood glucose goal after a meal = less than 180 : Continue to check blood sugar once daily, document, and provide at future appointments Discussed possibly increasing Ozempic to 1mg 53mfuture for more weight loss effects if able to tolerate.  Mailed application for Ozempic patient assistance program for 2023. Patient to complete and return to office.    Osteoporosis Screening / Bone Health Goal: reduce risk of fracture due to osteopenia/osteoporosis Current regimen:  Calcium/Vitamin 600mg/51munits twice daily  Vitamin D 400mg t76m daily Recent serum calcium elevated at 10.9 Interventions: Recommend lowering dose of calcium due to recent elevated serum calcium Decrease calcium supplement to take just 1 tablet daily  Continue current vitamin D supplementation  Health Maintenance:  Patient had mammogram 07/15/2020 but no records in EMR [facility]has to leave message on VM of medical records department) [facility] - was done 11/13/2020 per patient. [facility] Has to leave message on VM)    Medication management [facility] Patient is taking Eliquis for history of PE. She has been receiving from patient assistance recently. She has about 45  day supply on hand. She has reapplied for patient assistance for Eliquis for 2023 but received a letter that [employer] will not process until 02/20/2021.  Interventions Comprehensive medication review performed. Continue current medication management strategy Explained that patient assistance program for Eliquis might not allow her to enroll until she maxes out her Medicare benefit and reaches the coverage gap. Will check at patient request in early 2023.    Patient Goals/Self-Care Activities Over  the next 180 days, patient will:  take medications as prescribed,  check glucose daily , document, and provide at future appointments Engage in dietary modifications by limiting CHO serving sizes;  Continue to get at least 150 minutes of exercise weekly  Follow Up Plan: Telephone follow up appointment with care management team member scheduled for:  1 month to check on 2023 patient assistance programs         Medication Assistance:  Eliquis obtained through [employer] medication assistance program.  Enrollment ends 02/18/2021; Ozempic obtained from [employer] thru 02/18/2021 and Praluent through [employer] - unsure date enrollment will end.  Restarted application for 4627 for Ozempic - mailed to patient today;  Eliquis 0350 application is pending Approver per patient for [employer] for [employer] for 2023.   Patient's preferred pharmacy is:  [facility] [place] [place] [facility] [address] [facility] [place] [address] Phone: [phone] Fax: [phone]  [id number]: [phone]: [phone] [place] [address] [address]Phone: [phone]: [address]Phone: [phone]: [address]Phone: [phone]: [phone]  Follow Up:  Patient agrees to Care Plan and Follow-up.  Plan: Telephone follow up appointment with care management team member scheduled for:  1 month  TCherre Robins PharmD Clinical Pharmacist [phone]

## 2021-01-26 NOTE — Patient Instructions (Addendum)
Sierra Ford It was a pleasure speaking with you today.  I have attached a summary of our visit today and information about your health goals.   Our next appointment is by telephone on February 27, 2021 at 2:30pm  Please call the care guide team at [phone] if you need to cancel or reschedule your appointment.    If you have any questions or concerns, please feel free to contact me either at the phone number below or with a MyChart message.   Keep up the good work!  Cherre Robins, PharmD Clinical Pharmacist [facility] [phone] (direct line)  [phone] (main office number)  CARE PLAN ENTRY (see longitudinal plan of care for additional care plan information)  Current Barriers:  Chronic Disease Management support, education, and care coordination needs related to Hypertension, Hyperlipidemia/Atherosclerosis of Aorta, Diabetes, Heart Failure, History of PE, Osteoarthritis   Hypertension BP Readings from Last 3 Encounters:  12/29/20 130/70  10/11/20 120/78  08/11/20 130/72   Pharmacist Clinical Goal(s): Over the next 180 days, patient will work with PharmD and providers to maintain BP goal <130/80 Current regimen:  Diet and exercise management   Interventions: Discussed BP goal Patient self care activities - Over the next 180 days, patient will: Maintain BP less than 130/80  Hyperlipidemia Lab Results  Component Value Date/Time   LDLCALC 66 08/11/2020 11:14 AM   LDLCALC 69 11/10/2019 11:22 AM   LDLDIRECT 95.9 10/06/2012 12:48 PM   Pharmacist Clinical Goal(s): Over the next 180 days, patient will work with PharmD and providers to maintain LDL goal < 70 Current regimen:  Rosuvastatin 10mg  daily Praluent 75mg  every 14 days  Flaxseed Oil 1000mg  daily CoEnzyme Q10 daily  Interventions: Discussed LDL goal Patient self care activities - Over the next 180 days, patient will: Maintain cholesterol medication regimen.  Notify  clinical pharmacist if any future issues regarding cost of Praulent or [employer]  Diabetes Lab Results  Component Value Date/Time   HGBA1C 6.7 (H) 08/11/2020 11:14 AM   HGBA1C 6.6 (H) 05/10/2020 11:15 AM   HGBA1C 6.7 12/28/2016 12:00 AM   Pharmacist Clinical Goal(s): Over the next 180 days, patient will work with PharmD and providers to maintain A1c goal <7% Current regimen:  Ozempic 0.5mg  subcutaneously once per week Interventions: Discussed A1c  goal Reviewed home blood glucose goals  Fasting blood glucose goal (before meals) = 80 to 130 Blood glucose goal after a meal = less than 387  Mailed application for Ozempic patient assistance program for 2023. Patient to complete and return to office.  Patient self care activities - Over the next 180 days, patient will: Check blood sugar once daily, document, and provide at future appointments Contact provider with any episodes of hypoglycemia Complete application for Ozempic patient assistance program for 2023 and return to office.   Osteopenia/Osteoporosis Screening Pharmacist Clinical Goal(s) Over the next 180 days, patient will work with PharmD and providers to reduce risk of fracture due to osteopenia/osteoporosis Current regimen:  Calcium/Vitamin 600mg /800 units twice daily  Vitamin D 400mg  twice daily Interventions: Recommend lowering dose of calcium due to recent labs that showed elevated serum calcium Discussed intake of [phone] units of vitamin D through supplementation  Patient self care activities - Over the next 180 days, patient will: Decrease calcium supplement to take just 1 tablet daily  Continue current vitamin D supplementation  Medication management Pharmacist Clinical Goal(s): Over the next 180 days, patient will work with PharmD and providers to maintain optimal  medication adherence [facility] Interventions Comprehensive medication review performed. Continue current  medication management strategy Explained Eliquis patient assistance qualification - I will check on if she was approved in early 2023.   Patient self care activities - Over the next 180 days, patient will: Focus on medication adherence by filling and taking medications appropriately  Take medications as prescribed Report any questions or concerns to PharmD and/or provider(s)  Patient verbalizes understanding of instructions provided today and agrees to view in [place].

## 2021-01-28 ENCOUNTER — Other Ambulatory Visit: Payer: Self-pay | Admitting: Cardiology

## 2021-01-28 DIAGNOSIS — I10 Essential (primary) hypertension: Secondary | ICD-10-CM

## 2021-01-28 DIAGNOSIS — R600 Localized edema: Secondary | ICD-10-CM

## 2021-01-31 ENCOUNTER — Encounter: Payer: Self-pay | Admitting: Pulmonary Disease

## 2021-01-31 ENCOUNTER — Ambulatory Visit (INDEPENDENT_AMBULATORY_CARE_PROVIDER_SITE_OTHER): Payer: Medicare PPO | Admitting: Pulmonary Disease

## 2021-01-31 ENCOUNTER — Other Ambulatory Visit: Payer: Self-pay

## 2021-01-31 VITALS — BP 138/72 | HR 64 | Temp 98.1°F | Ht 62.0 in | Wt 184.8 lb

## 2021-01-31 DIAGNOSIS — R0609 Other forms of dyspnea: Secondary | ICD-10-CM

## 2021-01-31 DIAGNOSIS — G4733 Obstructive sleep apnea (adult) (pediatric): Secondary | ICD-10-CM

## 2021-01-31 MED ORDER — ALBUTEROL SULFATE HFA 108 (90 BASE) MCG/ACT IN AERS: 2.0000 | INHALATION_SPRAY | Freq: Four times a day (QID) | RESPIRATORY_TRACT | 2 refills | Status: DC | PRN

## 2021-01-31 NOTE — Patient Instructions (Addendum)
X CPAP is working well, check report on new machine  X schedule pFTs  X Rx for albuterol mDI 2 puffs as needed

## 2021-01-31 NOTE — Assessment & Plan Note (Signed)
She has a significant history of smoking but previous PFTs have not shown significant COPD.  Echo does not show any residual pulmonary hypertension.  I will provide her with an albuterol MDI and repeat PFTs.  Dyspnea might simply be related to deconditioning and she may do better with a rehab program

## 2021-01-31 NOTE — Progress Notes (Signed)
° °  Subjective:    Patient ID: Sierra Ford, female    DOB: 1940/06/05, 80 y.o.   MRN: [id number]  HPI   80 yo woman  followed for Moderate OSA on CPAP   PMH- R LE DVT and PE 2006  treated with coumadin x 54yrs 01/2019  acute PE with R heart strain, RLE DVT  Status post thrombolytic  , was discharged on oxygen   O2 was dc'd after last OV 09/2020 Rx for new CPAP10 cm was sent 11/2020 .  She obtained her new machine in November and really likes it.  Denies any somnolence or fatigue, no snoring has been noted by family members. She complains of persistent shortness of breath, uses a cane to ambulate  I reviewed PFTs and last echo She smoked about 50 pack years before she quit in 2011   Significant tests/ events reviewed  10/2020 ONO on CPAP/RA showed minimal desat , about 7 mins HST 11/08/15 AHI 28/h  05/2013 PFTs no airway obstruction, ratio 81, FEV1 98%, FVC 93%, DLCO 59% corrects to 80% for alveolar volume  02/2019 echo normal LVEF, normal RVSP   01/27/19 CTA chest PE >> Positive for acute PE with CT evidence of right heart strain (RV/LV Ratio = 1.7) consistent with at least submassive (intermediate risk)PE.    01/28/19  LE VAS duplex DVT >> Right: Findings consistent with acute deep vein thrombosis involving the right femoral vein, right popliteal vein, right posterior tibial veins, right peroneal veins, and right gastrocnemius veins.  Review of Systems neg for any significant sore throat, dysphagia, itching, sneezing, nasal congestion or excess/ purulent secretions, fever, chills, sweats, unintended wt loss, pleuritic or exertional cp, hempoptysis, orthopnea pnd or change in chronic leg swelling. Also denies presyncope, palpitations, heartburn, abdominal pain, nausea, vomiting, diarrhea or change in bowel or urinary habits, dysuria,hematuria, rash, arthralgias, visual complaints, headache, numbness weakness or ataxia.     Objective:   Physical Exam  Gen. Pleasant, obese, in no  distress ENT - no lesions, no post nasal drip Neck: No JVD, no thyromegaly, no carotid bruits Lungs: no use of accessory muscles, no dullness to percussion, decreased without rales or rhonchi  Cardiovascular: Rhythm regular, heart sounds  normal, no murmurs or gallops, no peripheral edema Musculoskeletal: No deformities, no cyanosis or clubbing , no tremors       Assessment & Plan:

## 2021-01-31 NOTE — Assessment & Plan Note (Signed)
CPAP report was reviewed which shows good control of events on 10 cm.  She is very compliant and CPAP is certainly helped improve her daytime somnolence and fatigue  Weight loss encouraged, compliance with goal of at least 4-6 hrs every night is the expectation. Advised against medications with sedative side effects Cautioned against driving when sleepy - understanding that sleepiness will vary on a day to day basis

## 2021-02-06 ENCOUNTER — Telehealth: Payer: 59

## 2021-02-27 ENCOUNTER — Ambulatory Visit (INDEPENDENT_AMBULATORY_CARE_PROVIDER_SITE_OTHER): Payer: Medicare PPO | Admitting: Pharmacist

## 2021-02-27 DIAGNOSIS — E785 Hyperlipidemia, unspecified: Secondary | ICD-10-CM

## 2021-02-27 DIAGNOSIS — E1165 Type 2 diabetes mellitus with hyperglycemia: Secondary | ICD-10-CM

## 2021-02-27 DIAGNOSIS — I779 Disorder of arteries and arterioles, unspecified: Secondary | ICD-10-CM

## 2021-02-27 NOTE — Chronic Care Management (AMB) (Signed)
Chronic Care Management Pharmacy Note  02/27/2021 Name:  Sierra Ford MRN:  [id number] DOB:  August 11, 1940  Summary: Received application for Ozempic. Complete provider portion. Forwarded to PCP to review and sign. Will fax once returned to office.  Patient was denied Eliquis patient assistance program. Recommended she first try to discuss with cardiology office since they have original application for 8469 Eliquis. Might be able to supply additional information or ask for reassessment.   Plan: F/U in 1 month to check 2023 patient assistance program status for Eliquis and Ozempic  Subjective: Sierra Ford is an 81 y.o. year old female who is a primary patient of Ann Held, DO.  The CCM team was consulted for assistance with disease management and care coordination needs.    Engaged with patient by telephone for follow up visit in response to provider referral for pharmacy case management and/or care coordination services.   Consent to Services:  The patient was given information about Chronic Care Management services, agreed to services, and gave verbal consent prior to initiation of services.  Please see initial visit note for detailed documentation.   Patient Care Team: Carollee Herter, Alferd Apa, DO as PCP - General Minus Breeding, MD as PCP - Cardiology (Cardiology) Maisie Fus, MD as Consulting Physician (Obstetrics and Gynecology) Collene Gobble, MD as Consulting Physician (Pulmonary Disease) Cherre Robins, RPH-CPP (Pharmacist)  Recent office visits: 12/29/2020 - Fam Med (Dr Carollee Herter) Office visit for cerumen impaction and immunization. Ears irragated. Received flu vaccine.  12/20/2020 - Fam Med (Dr Carollee Herter) Video Visit for Bronchitis. Prescribed azithromycin for 5 days. OTC mucinex as needed for cough 08/11/2020 - PCP (Dr Etter Sjogren) 3 month f/u; No med changes  Recent consult visits: 11/03/2020 - Ortho (Dr Gladstone Lighter) left should pain and low back  pain 10/11/2020 - Pulm (Dr Elsworth Soho) F/U OSA. Off supplemental O2 during day. Using CPAP at night. Provided handicapped sticker.  06/24/2020 - ortho (Dr Lawernce Pitts) seen for pain of right shoulder joint 06/23/2020 - ortho (Dr Caralyn Guile) seen for bilateral carpel tunnel syndrome 06/13/2020 - ortho (Dr Nelva Bush) seen for bilateral carpel tunnel syndrome   Hospital visits: None in previous 6 months  Objective:  Lab Results  Component Value Date   CREATININE 0.74 09/16/2020   CREATININE 0.83 08/11/2020   CREATININE 0.84 05/10/2020    Lab Results  Component Value Date   HGBA1C 6.7 (H) 08/11/2020   Last diabetic Eye exam:  Lab Results  Component Value Date/Time   HMDIABEYEEXA No Retinopathy 11/05/2019 12:00 AM    Last diabetic Foot exam: No results found for: HMDIABFOOTEX      Component Value Date/Time   CHOL 201 (H) 08/11/2020 1114   CHOL 209 (H) 08/26/2018 1018   TRIG 74.0 08/11/2020 1114   HDL 120.50 08/11/2020 1114   HDL 101 08/26/2018 1018   CHOLHDL 2 08/11/2020 1114   VLDL 14.8 08/11/2020 1114   LDLCALC 66 08/11/2020 1114   LDLCALC 69 11/10/2019 1122   LDLDIRECT 95.9 10/06/2012 1248    Hepatic Function Latest Ref Rng & Units 09/16/2020 08/11/2020 05/10/2020  Total Protein 6.0 - 8.3 g/dL 6.7 7.0 7.0  Albumin 3.5 - 5.2 g/dL 4.0 4.6 4.6  AST 0 - 37 U/L _0 ALT 0 - 35 U/L _1 Alk Phosphatase 39 - 117 U/L 72 76 78  Total Bilirubin 0.2 - 1.2 mg/dL 0.8 1.3(H) 1.1  Bilirubin, Direct 0.00 - 0.40 mg/dL - - -  Lab Results  Component Value Date/Time   TSH 1.250 05/27/2017 12:24 PM   TSH 1.27 12/28/2016 12:00 AM   TSH 1.00 01/03/2015 12:00 AM   TSH 1.61 08/07/2007 09:38 AM   FREET4 1.05 05/27/2017 12:24 PM    CBC Latest Ref Rng & Units 01/31/2019 01/30/2019 01/29/2019  WBC 4.0 - 10.5 K/uL 5.3 5.6 7.6  Hemoglobin 12.0 - 15.0 g/dL 12.0 12.9 13.9  Hematocrit 36.0 - 46.0 % 36.2 38.2 41.3  Platelets 150 - 400 K/uL 202 175 183    Lab Results  Component Value Date/Time    VD25OH 39.1 01/28/2018 09:24 AM   VD25OH 45.9 05/27/2017 12:24 PM    Clinical ASCVD: Yes  The ASCVD Risk score (Arnett DK, et al., 2019) failed to calculate for the following reasons:   The valid HDL cholesterol range is 20 to 100 mg/dL     Social History   Tobacco Use  Smoking Status Former   Packs/day: 1.00   Years: 50.00   Pack years: 50.00   Types: Cigarettes   Quit date: 11/21/2009   Years since quitting: 11.2  Smokeless Tobacco Never   BP Readings from Last 3 Encounters:  01/31/21 138/72  12/29/20 130/70  10/11/20 120/78   Pulse Readings from Last 3 Encounters:  01/31/21 64  12/29/20 65  10/11/20 78   Wt Readings from Last 3 Encounters:  01/31/21 184 lb 12.8 oz (83.8 kg)  12/29/20 187 lb 6.4 oz (85 kg)  10/11/20 183 lb 9.6 oz (83.3 kg)    Assessment: Review of patient past medical history, allergies, medications, health status, including review of consultants reports, laboratory and other test data, was performed as part of comprehensive evaluation and provision of chronic care management services.   SDOH:  (Social Determinants of Health) assessments and interventions performed:  SDOH Interventions    Flowsheet Row Most Recent Value  SDOH Interventions   Transportation Interventions Intervention Not Indicated       CCM Care Plan  No Known Allergies  Medications Reviewed Today     Reviewed by Cherre Robins, RPH-CPP (Pharmacist) on 02/27/21 at 1557  Med List Status: <None>   Medication Order Taking? Sig Documenting Provider Last Dose Status Informant  albuterol (VENTOLIN HFA) 108 (90 Base) MCG/ACT inhaler [id number] Yes Inhale 2 puffs into the lungs every 6 (six) hours as needed for wheezing or shortness of breath. Rigoberto Noel, MD Taking Active   apixaban (ELIQUIS) 5 MG TABS tablet [id number] Yes TAKE 1 TABLET BY MOUTH TWICE DAILY Minus Breeding, MD Taking Active   blood glucose meter kit and supplies KIT [id number] Yes Dispense based on patient and  insurance preference. Use up to four times daily as directed. (FOR ICD-9 250.00, 250.01). Ann Held, DO Taking Active Self  CALCIUM-VITAMIN D PO [id number] Yes Take 600 mg by mouth daily. [provider] Taking Active Self  cholecalciferol (VITAMIN D3) 10 MCG (400 UNIT) TABS tablet [id number] Yes Take 400 Units by mouth daily. [provider] Taking Active Self  Coenzyme Q10 100 MG TABS [id number] Yes Take 100 mg by mouth daily. [provider] Taking Active Self  Flaxseed, Linseed, (FLAXSEED OIL PO) [id number] Yes Take 1,000 mg by mouth daily.  [provider] Taking Active Self  furosemide (LASIX) 40 MG tablet [id number] Yes TAKE 1 TABLET DAILY Minus Breeding, MD Taking Active   Insulin Pen Needle (NOVOFINE PLUS PEN NEEDLE) 32G X 4 MM MISC [id number] Yes As directed Ann Held, DO Taking Active  Multiple Vitamin (MULTIVITAMIN) capsule [id number] Yes Take 1 capsule by mouth daily. [provider] Taking Active     Discontinued 05/07/11 1124 (Discontinued by provider) Omega-3 Fatty Acids (FISH OIL PO) [id number] Yes Take 1,000 mg by mouth daily. [provider] Taking Active Self  POTASSIUM CHLORIDE PO [id number] Yes Take 1 tablet by mouth as needed (muscle cramps).  [provider] Taking Active Self           Med Note Hali Marry, SARA E   Tue Jan 27, 2019  4:18 PM) Pt states she takes when she takes furosemide to mitigate cramping  PRALUENT 75 MG/ML SOAJ [id number] Yes INJECT 1 PEN INTO THE SKIN EVERY 14 DAYS Minus Breeding, MD Taking Active     Discontinued 05/07/11 1124 (Change in therapy) rosuvastatin (CRESTOR) 10 MG tablet [id number] Yes TAKE 1 TABLET DAILY Hochrein, Jeneen Rinks, MD Taking Active   Semaglutide,0.25 or 0.5MG/DOS, (OZEMPIC, 0.25 OR 0.5 MG/DOSE,) 2 MG/1.5ML SOPN [id number] Yes Inject 0.5 mg into the skin once a week. Carollee Herter, Kendrick Fries R, DO Taking Active   TRUE METRIX BLOOD GLUCOSE TEST test strip [id number] Yes TEST   UP  TO FOUR TIMES DAILY AS DIRECTED Ann Held, DO Taking Active Self  TRUEPLUS LANCETS 33G MISC [id number] Yes TEST  UP TO FOUR TIMES DAILY AS DIRECTED Ann Held, DO Taking Active Self            Patient Active Problem List   Diagnosis Date Noted   Bronchitis 12/20/2020   Pulmonary emboli ([place]) 01/27/2019   Educated about COVID-19 virus infection 01/26/2019   Hyperlipidemia associated with type 2 diabetes mellitus ([place]) 04/24/2018   Right ankle swelling 05/13/2017   DM (diabetes mellitus) type II uncontrolled, periph vascular disorder 04/08/2017   Essential hypertension 04/08/2017   Atherosclerosis of aorta ([place]) 04/08/2017   Abnormal auditory perception of both ears 10/01/2016   Bilateral impacted cerumen 10/01/2016   OSA (obstructive sleep apnea) 12/16/2015   DOE (dyspnea on exertion) 06/12/2013   Obesity (BMI 30-39.9) 04/24/2013   Chest pain, unspecified 01/22/2011   Abnormal stress test 01/22/2011   Frequent urination 01/22/2011   Chronic diastolic heart failure ([place]) 05/15/2009   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   RASH-NONVESICULAR 04/20/2009   CERVICAL STRAIN, WITH RADICULOPATHY 03/31/2009   CAROTID ARTERY DISEASE 01/12/2009   CERVICAL STRAIN 01/11/2009   LEG EDEMA, RIGHT 07/27/2008   HX, URINARY INFECTION 09/19/2006   Hyperlipidemia LDL goal <70 08/20/2006   PREGNANCY, ECTOPIC NEC W/INTRAUTERINE PRG 08/20/2006   FREQUENCY, URINARY 08/20/2006   Other specified abnormal findings of blood chemistry 08/20/2006   Personal history of venous thrombosis and embolism 08/20/2006    Immunization History  Administered Date(s) Administered   Fluad Quad(high Dose 65+) 12/04/2019, 12/29/2020   Influenza, High Dose Seasonal PF 12/22/2018   PFIZER Comirnaty(Gray Top)Covid-19 Tri-Sucrose Vaccine 06/08/2020   PFIZER(Purple Top)SARS-COV-2 Vaccination 03/06/2019, 03/27/2019, 11/10/2019   Pfizer Covid-19 Vaccine Bivalent Booster 71yr & up  12/06/2020   Pneumococcal Conjugate-13 04/24/2013   Pneumococcal Polysaccharide-23 08/07/2007, 12/04/2019   Td 03/03/2003   Tdap 11/06/2018   Zoster Recombinat (Shingrix) 11/06/2018, 05/11/2019   Zoster, Live 08/14/2007, 11/13/2007    Conditions to be addressed/monitored: CHF, CAD, HTN, HLD, DMII, and history of pulmonary embolism  Care Plan : General Pharmacy (Adult)  Updates made by ECherre Robins RPH-CPP since 02/27/2021 12:00 AM     Problem: type 2 DM; Hyperlipidemia; CHF: h/o PE; obesity; OSA      Long-Range Goal: Long Range pharmacy goals  for chronic conditions and medication management   Start Date: 08/17/2020  Recent Progress: On track  Priority: High  Note:   Current Barriers:  Unable to independently afford treatment regimen   Pharmacist Clinical Goal(s):  Over the next 180 days, patient will verbalize ability to afford treatment regimen maintain control of DM, BP and cholesterol as evidenced by maintaining goals listed below  adhere to prescribed medication regimen as evidenced by fill hisory  through collaboration with PharmD and provider.   Interventions: 1:1 collaboration with Carollee Herter, Alferd Apa, DO regarding development and update of comprehensive plan of care as evidenced by provider attestation and co-signature Inter-disciplinary care team collaboration (see longitudinal plan of care) Comprehensive medication review performed; medication list updated in electronic medical record   Hypertension Screening Controlled;  BP goal <130/80 Does not currently check blood pressure at home Current regimen:  Diet and exercise management   Interventions: Discussed BP goal  Hyperlipidemia Controlled; LDL goal < 70 Current regimen:  Rosuvastatin 45m daily Praluent 740mevery 14 days  Flaxseed Oil 100045maily CoEnzyme Q10 daily  Patient receives assistance with Praluent cost from [employer] she reports that she has received notification that  she was approved for 2023.  Interventions: Discussed LDL goal Maintain cholesterol medication regimen.  Notify clinical pharmacist if any future issues regarding cost of Praulent or [employer]  Diabetes Controlled;  A1c goal <7% Current regimen:  Ozempic 0.5mg41mbcutaneously once per week Tolerating Ozempic without nausea Received Ozempic form [employer] assistance in 2022. Completed application today for patient assistance program. Patient has [employer] Medicare part B and A government employee plan for prescriptions but verified that cost of Ozempic would be $200 on he GEHA plan. She states she has about 2 months of Ozempic on hand.  Exercise: doing either water aerobics or treadmill 5 days per week Diet: limiting serving sizes (easier with Ozempic) and sugar intake. She has also decreased intake of red meat.  Interventions: Discussed A1c  goal Reviewed home blood glucose goals  Fasting blood glucose goal (before meals) = 80 to 130 Blood glucose goal after a meal = less than 180 : Continue to check blood sugar once daily, document, and provide at future appointments Discussed possibly increasing Ozempic to 1mg 67mfuture for more weight loss effects if able to tolerate.  Completed provider portion of Ozempic patient assistance program - forwarded to PCP to review and sign.  Osteoporosis Screening / Bone Health Goal: reduce risk of fracture due to osteopenia/osteoporosis Current regimen:  Calcium/Vitamin 600mg/66munits twice daily  Vitamin D 400mg t18m daily Recent serum calcium elevated at 10.9 Interventions: Recommend lowering dose of calcium due to recent elevated serum calcium Decrease calcium supplement to take just 1 tablet daily  Continue current vitamin D supplementation  Health Maintenance:  Patient had mammogram 07/15/2020 but no records in EMR [facility] (has to leave message on VM of medical records  department) [facility] - was done 11/13/2020 per patient. [facility] Has to leave message on VM)    Medication management [facility] Patient is taking Eliquis for history of PE. She has been receiving from patient assistance recently. She has about 45 day supply on hand. She has reapplied for patient assistance for Eliquis for 2023 but received a letter  that [employer] denying her for patient assistance. I do not have her 7373 application. I think she might have applied through her cardiology office. Patient thinks that [employer] has made a mistake and asked them to review application again Interventions Comprehensive medication review performed. Continue current medication management strategy Eliquis patient assistance - recommended she discuss with cardiology office as I cannot see application that was submitted for 2023.    Patient Goals/Self-Care Activities Over the next 180 days, patient will:  take medications as prescribed,  check glucose daily , document, and provide at future appointments Engage in dietary modifications by limiting CHO serving sizes;  Continue to get at least 150 minutes of exercise weekly  Follow Up Plan: Telephone follow up appointment with care management team member scheduled for:  1 month to check on 2023 patient assistance programs         Medication Assistance:  Received Eliquis and Ozempic through patient assistance program in 2022.  Praluent through [employer]   Restarted application for 6681 for Ozempic - forwarded to PCP today Eliquis 5947 application initially denied. Patient is contacting cardiology office since they sent in for her.  Approved per patient for [employer] for [employer] for 2023.   Patient's preferred pharmacy is:  [facility] [place] [place] [facility] [address] [facility] [place] [address] Phone: [phone] Fax: [phone]  [id number]: [phone]: [phone] [place]- [address] [place] [address]Phone: [phone]: [address]Phone: [phone]: [address]Phone: [phone]: [phone]   Follow Up:  Patient agrees to Care Plan and Follow-up.  Plan: Telephone follow up appointment with care management team member scheduled for:  1 month  TCherre Robins PharmD Clinical Pharmacist [phone]

## 2021-02-27 NOTE — Patient Instructions (Addendum)
Sierra Ford It was a pleasure speaking with you today.  I have attached a summary of our visit today and information about your health goals.   If you have any questions or concerns, please feel free to contact me either at the phone number below or with a MyChart message.   Keep up the good work!  Cherre Robins, PharmD Clinical Pharmacist [facility] [phone] (direct line)  [phone] (main office number)  CARE PLAN ENTRY    Hypertension BP Readings from Last 3 Encounters:  12/29/20 130/70  10/11/20 120/78  08/11/20 130/72   Pharmacist Clinical Goal(s): Over the next 180 days, patient will work with PharmD and providers to maintain BP goal <130/80 Current regimen:  Diet and exercise management   Interventions: Discussed blood pressure  goal Patient self care activities - Over the next 180 days, patient will: Maintain blood pressure  less than 130/80  Hyperlipidemia Lab Results  Component Value Date/Time   LDLCALC 66 08/11/2020 11:14 AM   LDLCALC 69 11/10/2019 11:22 AM   LDLDIRECT 95.9 10/06/2012 12:48 PM   Pharmacist Clinical Goal(s): Over the next 180 days, patient will work with PharmD and providers to maintain LDL goal < 70 Current regimen:  Rosuvastatin 10mg  daily Praluent 75mg  every 14 days  Flaxseed Oil 1000mg  daily CoEnzyme Q10 daily  Interventions: Discussed LDL goal Patient self care activities - Over the next 180 days, patient will: Maintain cholesterol medication regimen.  Notify clinical pharmacist if any future issues regarding cost of Praulent or [place]  Diabetes Lab Results  Component Value Date/Time   HGBA1C 6.7 (H) 08/11/2020 11:14 AM   HGBA1C 6.6 (H) 05/10/2020 11:15 AM   HGBA1C 6.7 12/28/2016 12:00 AM   Pharmacist Clinical Goal(s): Over the next 180 days, patient will work with PharmD and providers to maintain A1c goal <7% Current regimen:  Ozempic 0.5mg  subcutaneously once per  week Interventions: Discussed A1c  goal Reviewed home blood glucose goals  Fasting blood glucose goal (before meals) = 80 to 130 Blood glucose goal after a meal = less than 180  Forwarded Ozempic application to  provider to review and sign. Will fax to patient assistance program once returned.  Patient self care activities - Over the next 180 days, patient will: Check blood sugar once daily, document, and provide at future appointments Contact provider with any episodes of hypoglycemia  Osteopenia/Osteoporosis Screening Pharmacist Clinical Goal(s) Over the next 180 days, patient will work with PharmD and providers to reduce risk of fracture due to osteopenia/osteoporosis Current regimen:  Calcium/Vitamin 600mg /800 units twice daily  Vitamin D 400mg  twice daily Interventions: Recommend lowering dose of calcium due to recent labs that showed elevated serum calcium Discussed intake of [phone] units of vitamin D through supplementation  Patient self care activities - Over the next 180 days, patient will: Decrease calcium supplement to take just 1 tablet daily  Continue current vitamin D supplementation  Medication management Pharmacist Clinical Goal(s): Over the next 180 days, patient will work with PharmD and providers to maintain optimal medication adherence [facility] Interventions Comprehensive medication review performed. Continue current medication management strategy Patient self care activities - Over the next 180 days, patient will: Focus on medication adherence by filling and taking medications appropriately  Take medications as prescribed Report any questions or concerns to PharmD and/or provider(s) Check with cardiology office about Eliquis application  Patient verbalizes understanding of instructions provided today and agrees to view in [place].

## 2021-02-28 ENCOUNTER — Telehealth: Payer: Self-pay | Admitting: Cardiology

## 2021-02-28 MED ORDER — APIXABAN 5 MG PO TABS: 5.0000 mg | ORAL_TABLET | Freq: Two times a day (BID) | ORAL | 4 refills | Status: DC

## 2021-02-28 NOTE — Addendum Note (Signed)
Addended by: Waylan Rocher on: 02/28/2021 01:29 PM   Modules accepted: Orders

## 2021-02-28 NOTE — Telephone Encounter (Signed)
Call pt and notified that I had spoke with [employer] and that she will need to write a letter to make an appeal stating the co-pay amount and why she cannot afford to pay. She will await denial letter in the mail and will bring it and her written letter her to have it faxed. Will discuss further options when she comes in for her scheduled appt in Feb. Verbalized understanding. Refill sent to requested pharmacy as discussed

## 2021-02-28 NOTE — Telephone Encounter (Signed)
Called [employer] rep said that it was denied d/t pt's insurance covers medication and pt will need to write a appeal letter explaining why she cannot afford the co-pay  Tried to call pt, [id number]

## 2021-02-28 NOTE — Telephone Encounter (Signed)
Patient said she was having a hard time filling out her application for her Eliquis assistance. She would like to talk to Dr. Rosezella [place] Nurse

## 2021-02-28 NOTE — Telephone Encounter (Signed)
Returned call to pt she states that she called [employer] and they told her it wad denied. Will call [employer]

## 2021-02-28 NOTE — Telephone Encounter (Signed)
° °  Pt is returning call she said to call her back on [phone]

## 2021-03-02 NOTE — Telephone Encounter (Signed)
Patient has been approved for eliquis patient assistance.

## 2021-03-07 ENCOUNTER — Encounter: Payer: Self-pay | Admitting: Family Medicine

## 2021-03-07 ENCOUNTER — Ambulatory Visit (INDEPENDENT_AMBULATORY_CARE_PROVIDER_SITE_OTHER): Payer: Medicare PPO | Admitting: Family Medicine

## 2021-03-07 VITALS — BP 110/78 | HR 65 | Temp 98.0°F | Resp 20 | Ht 62.0 in | Wt 192.0 lb

## 2021-03-07 DIAGNOSIS — M461 Sacroiliitis, not elsewhere classified: Secondary | ICD-10-CM | POA: Insufficient documentation

## 2021-03-07 DIAGNOSIS — D692 Other nonthrombocytopenic purpura: Secondary | ICD-10-CM

## 2021-03-07 DIAGNOSIS — Z0000 Encounter for general adult medical examination without abnormal findings: Secondary | ICD-10-CM | POA: Diagnosis not present

## 2021-03-07 DIAGNOSIS — I700 Atherosclerosis of aorta: Secondary | ICD-10-CM

## 2021-03-07 DIAGNOSIS — I5032 Chronic diastolic (congestive) heart failure: Secondary | ICD-10-CM

## 2021-03-07 DIAGNOSIS — I2609 Other pulmonary embolism with acute cor pulmonale: Secondary | ICD-10-CM | POA: Diagnosis not present

## 2021-03-07 DIAGNOSIS — E785 Hyperlipidemia, unspecified: Secondary | ICD-10-CM

## 2021-03-07 DIAGNOSIS — I10 Essential (primary) hypertension: Secondary | ICD-10-CM

## 2021-03-07 DIAGNOSIS — E1165 Type 2 diabetes mellitus with hyperglycemia: Secondary | ICD-10-CM | POA: Diagnosis not present

## 2021-03-07 DIAGNOSIS — E1169 Type 2 diabetes mellitus with other specified complication: Secondary | ICD-10-CM

## 2021-03-07 LAB — CBC WITH DIFFERENTIAL/PLATELET
Basophils Absolute: 0 10*3/uL (ref 0.0–0.1)
Basophils Relative: 0.8 % (ref 0.0–3.0)
Eosinophils Absolute: 0 10*3/uL (ref 0.0–0.7)
Eosinophils Relative: 0.9 % (ref 0.0–5.0)
HCT: 42.7 % (ref 36.0–46.0)
Hemoglobin: 14.1 g/dL (ref 12.0–15.0)
Lymphocytes Relative: 33.4 % (ref 12.0–46.0)
Lymphs Abs: 1.8 10*3/uL (ref 0.7–4.0)
MCHC: 33 g/dL (ref 30.0–36.0)
MCV: 99 fl (ref 78.0–100.0)
Monocytes Absolute: 0.5 10*3/uL (ref 0.1–1.0)
Monocytes Relative: 9.2 % (ref 3.0–12.0)
Neutro Abs: 3 10*3/uL (ref 1.4–7.7)
Neutrophils Relative %: 55.7 % (ref 43.0–77.0)
Platelets: 224 10*3/uL (ref 150.0–400.0)
RBC: 4.31 Mil/uL (ref 3.87–5.11)
RDW: 14.1 % (ref 11.5–15.5)
WBC: 5.3 10*3/uL (ref 4.0–10.5)

## 2021-03-07 LAB — COMPREHENSIVE METABOLIC PANEL
ALT: 21 U/L (ref 0–35)
AST: 19 U/L (ref 0–37)
Albumin: 4.2 g/dL (ref 3.5–5.2)
Alkaline Phosphatase: 67 U/L (ref 39–117)
BUN: 16 mg/dL (ref 6–23)
CO2: 27 mEq/L (ref 19–32)
Calcium: 9.4 mg/dL (ref 8.4–10.5)
Chloride: 104 mEq/L (ref 96–112)
Creatinine, Ser: 0.77 mg/dL (ref 0.40–1.20)
GFR: 73.34 mL/min (ref >60.00)
Glucose, Bld: 89 mg/dL (ref 70–99)
Potassium: 4.3 mEq/L (ref 3.5–5.1)
Sodium: 140 mEq/L (ref 135–145)
Total Bilirubin: 1 mg/dL (ref 0.2–1.2)
Total Protein: 6.8 g/dL (ref 6.0–8.3)

## 2021-03-07 LAB — LIPID PANEL
Cholesterol: 218 mg/dL — ABNORMAL HIGH (ref 0–200)
HDL: 137.4 mg/dL (ref >39.00)
LDL Cholesterol: 70 mg/dL (ref 0–99)
NonHDL: 81.01
Total CHOL/HDL Ratio: 2
Triglycerides: 54 mg/dL (ref 0.0–149.0)
VLDL: 10.8 mg/dL (ref 0.0–40.0)

## 2021-03-07 LAB — MICROALBUMIN / CREATININE URINE RATIO
Creatinine,U: 99.1 mg/dL
Microalb Creat Ratio: 1.1 mg/g (ref 0.0–30.0)
Microalb, Ur: 1.1 mg/dL (ref 0.0–1.9)

## 2021-03-07 LAB — HEMOGLOBIN A1C: Hgb A1c MFr Bld: 6.6 % — ABNORMAL HIGH (ref 4.6–6.5)

## 2021-03-07 NOTE — Assessment & Plan Note (Signed)
On eliquis

## 2021-03-07 NOTE — Assessment & Plan Note (Signed)
ghm utd Check labs  See avs ACW info given to the pt

## 2021-03-07 NOTE — Assessment & Plan Note (Signed)
Well controlled, no changes to meds. Encouraged heart healthy diet such as the DASH diet and exercise as tolerated.  °

## 2021-03-07 NOTE — Progress Notes (Signed)
Subjective:   By signing my name below, I, [name], attest that this documentation has been prepared under the direction and in the presence of Dr. Roma Schanz, DO. 03/07/2021    Patient ID: Sierra Ford, female    DOB: 1940/09/22, 81 y.o.   MRN: [id number]  Chief Complaint  Patient presents with   Annual Exam    Pt states fasting     HPI Patient is in today for a comprehensive physical exam.  She continues having joint pain. Her pain disturbs her sleep. She is taking tylenol PM to manage her pain at night. She is seeing an orthopedist specialist and is receiving injections to manage her pain. Her most recent injection was in her shoulder. She also reports her eye sight is worsening. She thinks it is due to her diabetes. She regularly checks her blood pressure and reports they are measuring normal. She is seeing an eye doctor at this time and reports they found signs of a cataract.  She denies having any fever, new moles, congestion, sore throat, new muscle pain, new joint pain, chest pain, cough, SOB, wheezing, n/v/d, constipation, blood in stool, dysuria, frequency, hematuria, or headaches at this time. She has no recent changes to her family medical history. She has no recent surgical procedures in the past year.  She is UTD on Covid-19 vaccines. She is UTD on pneumonia vaccines. She is UTD on flu vaccines this year. She is UTD on shingles vaccines.  She does not participate in regular exercise at this time.   Past Medical History:  Diagnosis Date   Anxiety    Back pain    Chest pain    CLite with apical ischemia in 2006 - normal coronary arteries by [facility] in 12/2004;  Myoview 11/12:  Low risk stress nuclear study with a small, partially reversible apical defect most likely related to apical thinning; cannot R/O very mild apical ischemia.  EF: 75%    Decreased hearing    Depression    Diabetes ([place])    DVT (deep venous thrombosis) ([place]) 2006   hx of   Ectopic  pregnancy with intrauterine pregnancy    Hiatal hernia    Hyperlipidemia    PE (pulmonary embolism) 2006   hx of   Sleep apnea    CPAP     Past Surgical History:  Procedure Laterality Date   ABDOMINAL EXPLORATION SURGERY     BUNIONECTOMY     right   COLONOSCOPY  2009   ECTOPIC PREGNANCY SURGERY     FOOT SURGERY     TUBAL LIGATION      Family History  Problem Relation Age of Onset   Heart failure Mother        died from   Colon polyps Mother    Hypertension Mother    Sudden death Mother    Obesity Mother    Alcoholism Father    Diabetes Maternal Aunt        grandaughter   Diabetes Maternal Aunt    Colon cancer Neg Hx    Esophageal cancer Neg Hx    Stomach cancer Neg Hx     Social History   Socioeconomic History   Marital status: Divorced    Spouse name: Not on file   Number of children: Not on file   Years of education: Not on file   Highest education level: Not on file  Occupational History   Occupation: Retired Scientist, research (medical)  Tobacco Use   Smoking  status: Former    Packs/day: 1.00    Years: 50.00    Pack years: 50.00    Types: Cigarettes    Quit date: 11/21/2009    Years since quitting: 11.2   Smokeless tobacco: Never  Vaping Use   Vaping Use: Never used  Substance and Sexual Activity   Alcohol use: Yes    Comment: rare 3 times a year per pt   Drug use: No   Sexual activity: Not Currently    Partners: Male  Other Topics Concern   Not on file  Social History Narrative   Regular exercise--- no   Social Determinants of Health   Financial Resource Strain: Medium Risk   Difficulty of Paying Living Expenses: Somewhat hard  Food Insecurity: No Food Insecurity   Worried About Charity fundraiser in the Last Year: Never true   Ran Out of Food in the Last Year: Never true  Transportation Needs: No Transportation Needs   Lack of Transportation (Medical): No   Lack of Transportation (Non-Medical): No  Physical Activity: Sufficiently Active   Days of  Exercise per Week: 4 days   Minutes of Exercise per Session: 60 min  Stress: No Stress Concern Present   Feeling of Stress : Only a little  Social Connections: Moderately Integrated   Frequency of Communication with Friends and Family: More than three times a week   Frequency of Social Gatherings with Friends and Family: More than three times a week   Attends Religious Services: More than 4 times per year   Active Member of [employer] or Organizations: Yes   Attends Archivist Meetings: 1 to 4 times per year   Marital Status: Widowed  Human resources officer Violence: Not At Risk   Fear of Current or Ex-Partner: No   Emotionally Abused: No   Physically Abused: No   Sexually Abused: No    Outpatient Medications Prior to Visit  Medication Sig Dispense Refill   albuterol (VENTOLIN HFA) 108 (90 Base) MCG/ACT inhaler Inhale 2 puffs into the lungs every 6 (six) hours as needed for wheezing or shortness of breath. 8 g 2   apixaban (ELIQUIS) 5 MG TABS tablet Take 1 tablet (5 mg total) by mouth 2 (two) times daily. 60 tablet 4   blood glucose meter kit and supplies KIT Dispense based on patient and insurance preference. Use up to four times daily as directed. (FOR ICD-9 250.00, 250.01). 1 each 0   CALCIUM-VITAMIN D PO Take 600 mg by mouth daily.     cholecalciferol (VITAMIN D3) 10 MCG (400 UNIT) TABS tablet Take 400 Units by mouth daily.     Coenzyme Q10 100 MG TABS Take 100 mg by mouth daily.     Flaxseed, Linseed, (FLAXSEED OIL PO) Take 1,000 mg by mouth daily.      furosemide (LASIX) 40 MG tablet TAKE 1 TABLET DAILY 90 tablet 2   Insulin Pen Needle (NOVOFINE PLUS PEN NEEDLE) 32G X 4 MM MISC As directed 100 each 1   Multiple Vitamin (MULTIVITAMIN) capsule Take 1 capsule by mouth daily.     Omega-3 Fatty Acids (FISH OIL PO) Take 1,000 mg by mouth daily.     POTASSIUM CHLORIDE PO Take 1 tablet by mouth as needed (muscle cramps).      PRALUENT 75 MG/ML SOAJ INJECT 1 PEN INTO THE SKIN EVERY 14  DAYS 2 mL 11   rosuvastatin (CRESTOR) 10 MG tablet TAKE 1 TABLET DAILY 90 tablet 2   Semaglutide,0.25 or  0.5MG/DOS, (OZEMPIC, 0.25 OR 0.5 MG/DOSE,) 2 MG/1.5ML SOPN Inject 0.5 mg into the skin once a week. 4.5 mL 1   TRUE METRIX BLOOD GLUCOSE TEST test strip TEST  UP  TO FOUR TIMES DAILY AS DIRECTED 200 each 12   TRUEPLUS LANCETS 33G MISC TEST  UP TO FOUR TIMES DAILY AS DIRECTED 200 each 12   No facility-administered medications prior to visit.    No Known Allergies  Review of Systems  Constitutional:  Negative for fever and malaise/fatigue.  HENT:  Negative for congestion and sore throat.   Eyes:  Negative for blurred vision.       (+)worsening vision  Respiratory:  Negative for cough, shortness of breath and wheezing.   Cardiovascular:  Negative for chest pain, palpitations and leg swelling.  Gastrointestinal:  Negative for abdominal pain, blood in stool, constipation, diarrhea, nausea and vomiting.  Genitourinary:  Negative for dysuria, frequency and hematuria.  Musculoskeletal:  Negative for falls, joint pain and myalgias.  Skin:  Negative for rash.       (-)New moles  Neurological:  Negative for dizziness, loss of consciousness and headaches.  Endo/Heme/Allergies:  Negative for environmental allergies.  Psychiatric/Behavioral:  Negative for depression. The patient is not nervous/anxious.       Objective:    Physical Exam Vitals and nursing note reviewed.  Constitutional:      General: She is not in acute distress.    Appearance: Normal appearance. She is well-developed. She is not ill-appearing.  HENT:     Head: Normocephalic and atraumatic.     Right Ear: Tympanic membrane, ear canal and external ear normal.     Left Ear: Tympanic membrane, ear canal and external ear normal.  Eyes:     Extraocular Movements: Extraocular movements intact.     Conjunctiva/sclera: Conjunctivae normal.     Pupils: Pupils are equal, round, and reactive to light.  Neck:     Thyroid: No  thyromegaly.     Vascular: No carotid bruit or JVD.  Cardiovascular:     Rate and Rhythm: Normal rate and regular rhythm.     Heart sounds: Normal heart sounds. No murmur heard.   No gallop.  Pulmonary:     Effort: Pulmonary effort is normal. No respiratory distress.     Breath sounds: Normal breath sounds. No wheezing or rales.  Chest:     Chest wall: No tenderness.  Abdominal:     General: Bowel sounds are normal. There is no distension.     Palpations: Abdomen is soft.     Tenderness: There is no abdominal tenderness. There is no guarding.  Musculoskeletal:     Cervical back: Normal range of motion and neck supple.  Skin:    General: Skin is warm and dry.  Neurological:     Mental Status: She is alert and oriented to person, place, and time.  Psychiatric:        Mood and Affect: Mood normal.        Behavior: Behavior normal.        Thought Content: Thought content normal.        Judgment: Judgment normal.    BP 110/78 (BP Location: Left Arm, Patient Position: Sitting, Cuff Size: Large)    Pulse 65    Temp 98 F (36.7 C) (Oral)    Resp 20    Ht _0  (1.575 m)    Wt 192 lb (87.1 kg)    SpO2 97%    BMI 35.12 kg/m  Wt Readings from Last 3 Encounters:  03/07/21 192 lb (87.1 kg)  01/31/21 184 lb 12.8 oz (83.8 kg)  12/29/20 187 lb 6.4 oz (85 kg)    Diabetic Foot Exam - Simple   No data filed    Lab Results  Component Value Date   WBC 5.3 01/31/2019   HGB 12.0 01/31/2019   HCT 36.2 01/31/2019   PLT 202 01/31/2019   GLUCOSE 97 09/16/2020   CHOL 201 (H) 08/11/2020   TRIG 74.0 08/11/2020   HDL 120.50 08/11/2020   LDLDIRECT 95.9 10/06/2012   LDLCALC 66 08/11/2020   ALT 16 09/16/2020   AST 16 09/16/2020   NA 140 09/16/2020   K 3.9 09/16/2020   CL 103 09/16/2020   CREATININE 0.74 09/16/2020   BUN 10 09/16/2020   CO2 28 09/16/2020   TSH 1.250 05/27/2017   INR 2.0 (H) 01/29/2019   HGBA1C 6.7 (H) 08/11/2020   MICROALBUR <0.7 08/11/2020    Lab Results  Component  Value Date   TSH 1.250 05/27/2017   Lab Results  Component Value Date   WBC 5.3 01/31/2019   HGB 12.0 01/31/2019   HCT 36.2 01/31/2019   MCV 93.8 01/31/2019   PLT 202 01/31/2019   Lab Results  Component Value Date   NA 140 09/16/2020   K 3.9 09/16/2020   CO2 28 09/16/2020   GLUCOSE 97 09/16/2020   BUN 10 09/16/2020   CREATININE 0.74 09/16/2020   BILITOT 0.8 09/16/2020   ALKPHOS 72 09/16/2020   AST 16 09/16/2020   ALT 16 09/16/2020   PROT 6.7 09/16/2020   ALBUMIN 4.0 09/16/2020   CALCIUM 9.6 09/16/2020   ANIONGAP 11 01/31/2019   GFR 77.18 09/16/2020   Lab Results  Component Value Date   CHOL 201 (H) 08/11/2020   Lab Results  Component Value Date   HDL 120.50 08/11/2020   Lab Results  Component Value Date   LDLCALC 66 08/11/2020   Lab Results  Component Value Date   TRIG 74.0 08/11/2020   Lab Results  Component Value Date   CHOLHDL 2 08/11/2020   Lab Results  Component Value Date   HGBA1C 6.7 (H) 08/11/2020   Colonoscopy- Last completed 07/17/2017. Results showed: - Non-bleeding internal hemorrhoids. - Moderate diverticulosis in the sigmoid colon and in the descending colon. - No specimens collected. Otherwise results are normal. No repeat due to age.  Mammogram- Last completed 07/15/2020.     Assessment & Plan:   Problem List Items Addressed This Visit       Unprioritized   Atherosclerosis of aorta ([place])    On statin  And praluent      Diabetes mellitus, type II ([place])    hgba1c to be checked, minimize simple carbs. Increase exercise as tolerated. Continue current meds       Relevant Orders   Hemoglobin A1c   Lipid panel   Essential hypertension    Well controlled, no changes to meds. Encouraged heart healthy diet such as the DASH diet and exercise as tolerated.       Hyperlipidemia associated with type 2 diabetes mellitus ([place])    Encourage heart healthy diet such as MIND or DASH diet, increase exercise, avoid trans fats, simple  carbohydrates and processed foods, consider a krill or fish or flaxseed oil cap daily.  con't praluent and statin       Relevant Orders   Comprehensive metabolic panel   Lipid panel   Morbid (severe) obesity due to excess calories (  [place])   Other pulmonary embolism with acute cor pulmonale, unspecified chronicity ([place])    On eliquis       Preventative health care - Primary    ghm utd Check labs  See avs ACW info given to the pt      Sacroiliitis [facility])   Other Visit Diagnoses     Primary hypertension       Relevant Orders   Comprehensive metabolic panel   CBC with Differential/Platelet   Hemoglobin A1c   Lipid panel   Microalbumin / creatinine urine ratio   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   I, Dr. Roma Schanz, DO, personally preformed the services described in this documentation.  All medical record entries made by the scribe were at my direction and in my presence.  I have reviewed the chart and discharge instructions (if applicable) and agree that the record reflects my personal performance and is accurate and complete. 03/07/2021   I,[name],acting as a scribe for Ann Held, DO.,have documented all relevant documentation on the behalf of Ann Held, DO,as directed by  Ann Held, DO while in the presence of Ann Held, DO.   Ann Held, DO

## 2021-03-07 NOTE — Assessment & Plan Note (Signed)
hgba1c to be checked, minimize simple carbs. Increase exercise as tolerated. Continue current meds  

## 2021-03-07 NOTE — Assessment & Plan Note (Signed)
On lasix

## 2021-03-07 NOTE — Assessment & Plan Note (Addendum)
On statin  And praluent

## 2021-03-07 NOTE — Patient Instructions (Signed)
Preventive Care 65 Years and Older, Female °Preventive care refers to lifestyle choices and visits with your health care provider that can promote health and wellness. Preventive care visits are also called wellness exams. °What can I expect for my preventive care visit? °Counseling °Your health care provider may ask you questions about your: °Medical history, including: °Past medical problems. °Family medical history. °Pregnancy and menstrual history. °History of falls. °Current health, including: °Memory and ability to understand (cognition). °Emotional well-being. °Home life and relationship well-being. °Sexual activity and sexual health. °Lifestyle, including: °Alcohol, nicotine or tobacco, and drug use. °Access to firearms. °Diet, exercise, and sleep habits. °Work and work environment. °Sunscreen use. °Safety issues such as seatbelt and bike helmet use. °Physical exam °Your health care provider will check your: °Height and weight. These may be used to calculate your BMI (body mass index). BMI is a measurement that tells if you are at a healthy weight. °Waist circumference. This measures the distance around your waistline. This measurement also tells if you are at a healthy weight and may help predict your risk of certain diseases, such as type 2 diabetes and high blood pressure. °Heart rate and blood pressure. °Body temperature. °Skin for abnormal spots. °What immunizations do I need? °Vaccines are usually given at various ages, according to a schedule. Your health care provider will recommend vaccines for you based on your age, medical history, and lifestyle or other factors, such as travel or where you work. °What tests do I need? °Screening °Your health care provider may recommend screening tests for certain conditions. This may include: °Lipid and cholesterol levels. °Hepatitis C test. °Hepatitis B test. °HIV (human immunodeficiency virus) test. °STI (sexually transmitted infection) testing, if you are at  risk. °Lung cancer screening. °Colorectal cancer screening. °Diabetes screening. This is done by checking your blood sugar (glucose) after you have not eaten for a while (fasting). °Mammogram. Talk with your health care provider about how often you should have regular mammograms. °BRCA-related cancer screening. This may be done if you have a family history of breast, ovarian, tubal, or peritoneal cancers. °Bone density scan. This is done to screen for osteoporosis. °Talk with your health care provider about your test results, treatment options, and if necessary, the need for more tests. °Follow these instructions at home: °Eating and drinking ° °Eat a diet that includes fresh fruits and vegetables, whole grains, lean protein, and low-fat dairy products. Limit your intake of foods with high amounts of sugar, saturated fats, and salt. °Take vitamin and mineral supplements as recommended by your health care provider. °Do not drink alcohol if your health care provider tells you not to drink. °If you drink alcohol: °Limit how much you have to 0-1 drink a day. °Know how much alcohol is in your drink. In the [place] one drink equals one 12 oz bottle of beer (355 mL), one 5 oz glass of wine (148 mL), or one 1½ oz glass of hard liquor (44 mL). °Lifestyle °Brush your teeth every morning and night with fluoride toothpaste. Floss one time each day. °Exercise for at least 30 minutes 5 or more days each week. °Do not use any products that contain nicotine or tobacco. These products include cigarettes, chewing tobacco, and vaping devices, such as e-cigarettes. If you need help quitting, ask your health care provider. °Do not use drugs. °If you are sexually active, practice safe sex. Use a condom or other form of protection in order to prevent STIs. °Take aspirin only as told by your   health care provider. Make sure that you understand how much to take and what form to take. Work with your health care provider to find out whether it  is safe and beneficial for you to take aspirin daily. Ask your health care provider if you need to take a cholesterol-lowering medicine (statin). Find healthy ways to manage stress, such as: Meditation, yoga, or listening to music. Journaling. Talking to a trusted person. Spending time with friends and family. Minimize exposure to UV radiation to reduce your risk of skin cancer. Safety Always wear your seat belt while driving or riding in a vehicle. Do not drive: If you have been drinking alcohol. Do not ride with someone who has been drinking. When you are tired or distracted. While texting. If you have been using any mind-altering substances or drugs. Wear a helmet and other protective equipment during sports activities. If you have firearms in your house, make sure you follow all gun safety procedures. What's next? Visit your health care provider once a year for an annual wellness visit. Ask your health care provider how often you should have your eyes and teeth checked. Stay up to date on all vaccines. This information is not intended to replace advice given to you by your health care provider. Make sure you discuss any questions you have with your health care provider. Document Revised: 08/03/2020 Document Reviewed: 08/03/2020 Elsevier Patient Education  [place].

## 2021-03-07 NOTE — Assessment & Plan Note (Signed)
Encourage heart healthy diet such as MIND or DASH diet, increase exercise, avoid trans fats, simple carbohydrates and processed foods, consider a krill or fish or flaxseed oil cap daily.  con't praluent and statin

## 2021-03-07 NOTE — Progress Notes (Signed)
° °  Subjective:    Patient ID: Sierra Ford, female    DOB: 1940/10/07, 81 y.o.   MRN: [id number]  Chief Complaint  Patient presents with   Annual Exam    Pt states fasting

## 2021-03-17 DIAGNOSIS — M25512 Pain in left shoulder: Secondary | ICD-10-CM | POA: Diagnosis not present

## 2021-03-20 ENCOUNTER — Ambulatory Visit: Payer: Medicare PPO | Admitting: Pharmacist

## 2021-03-20 DIAGNOSIS — E1169 Type 2 diabetes mellitus with other specified complication: Secondary | ICD-10-CM

## 2021-03-20 DIAGNOSIS — E1165 Type 2 diabetes mellitus with hyperglycemia: Secondary | ICD-10-CM

## 2021-03-20 DIAGNOSIS — I10 Essential (primary) hypertension: Secondary | ICD-10-CM

## 2021-03-20 NOTE — Patient Instructions (Signed)
Sierra Ford It was a pleasure speaking with you today.  I have attached a summary of our visit today and information about your health goals.   Our next appointment is by telephone on April 04, 2021 at 12:30pm  Please call the care guide team at [phone] if you need to cancel or reschedule your appointment.     If you have any questions or concerns, please feel free to contact me either at the phone number below or with a MyChart message.   Keep up the good work!  Cherre Robins, PharmD Clinical Pharmacist [facility] [phone] (direct line)  [phone] (main office number)   CARE PLAN ENTRY (see longitudinal plan of care for additional care plan information)  Current Barriers:  Chronic Disease Management support, education, and care coordination needs related to Hypertension, Hyperlipidemia/Atherosclerosis of Aorta, Diabetes, Heart Failure, History of PE, Osteoarthritis   Hypertension BP Readings from Last 3 Encounters:  03/07/21 110/78  01/31/21 138/72  12/29/20 130/70   Pharmacist Clinical Goal(s): Over the next 180 days, patient will work with PharmD and providers to maintain BP goal <130/80 Current regimen:  Diet and exercise management   Interventions: Discussed blood pressure  goal Patient self care activities - Over the next 180 days, patient will: Maintain blood pressure  less than 130/80  Hyperlipidemia Lab Results  Component Value Date/Time   LDLCALC 70 03/07/2021 11:06 AM   LDLCALC 69 11/10/2019 11:22 AM   LDLDIRECT 95.9 10/06/2012 12:48 PM   Pharmacist Clinical Goal(s): Over the next 180 days, patient will work with PharmD and providers to maintain LDL goal < 70 Current regimen:  Rosuvastatin 10mg  daily Praluent 75mg  every 14 days  Flaxseed Oil 1000mg  daily CoEnzyme Q10 daily  Interventions: Discussed LDL goal Patient self care activities - Over the next 180 days, patient will: Maintain cholesterol  medication regimen.  Notify clinical pharmacist if any future issues regarding cost of Praulent or [place]  Diabetes Lab Results  Component Value Date/Time   HGBA1C 6.6 (H) 03/07/2021 11:06 AM   HGBA1C 6.7 (H) 08/11/2020 11:14 AM   HGBA1C 6.7 12/28/2016 12:00 AM   Pharmacist Clinical Goal(s): Over the next 180 days, patient will work with PharmD and providers to maintain A1c goal <7% Current regimen:  Ozempic 0.5mg  subcutaneously once per week Interventions: Discussed A1c  goal Called patient assistance program for Ozempic - patient approved thru 06/03/2021 but has not shipped yet. Reviewed home blood glucose goals  Fasting blood glucose goal (before meals) = 80 to 130 Blood glucose goal after a meal = less than 180  Patient self care activities - Over the next 180 days, patient will: Check blood sugar once daily, document, and provide at future appointments Contact provider with any episodes of hypoglycemia  Osteopenia/Osteoporosis Screening Pharmacist Clinical Goal(s) Over the next 180 days, patient will work with PharmD and providers to reduce risk of fracture due to osteopenia/osteoporosis Current regimen:  Calcium/Vitamin 600mg /800 units twice daily  Vitamin D 400mg  twice daily Interventions: Recommend lowering dose of calcium due to recent labs that showed elevated serum calcium Discussed intake of [phone] units of vitamin D through supplementation  Patient self care activities - Over the next 180 days, patient will: Decrease calcium supplement to take just 1 tablet daily  Continue current vitamin D supplementation  Medication management Pharmacist Clinical Goal(s): Over the next 180 days, patient will work with PharmD and providers to maintain optimal medication adherence Current pharmacy: [facility] order Interventions  Comprehensive medication review performed. Continue current medication management strategy Patient self care activities - Over the  next 180 days, patient will: Focus on medication adherence by filling and taking medications appropriately  Take medications as prescribed Report any questions or concerns to PharmD and/or provider(s)

## 2021-03-20 NOTE — Chronic Care Management (AMB) (Signed)
Chronic Care Management Pharmacy Note  03/20/2021 Name:  DONNALYN JURAN MRN:  [id number] DOB:  1940-05-21  Summary: Verified patient was approved for Ozempic patient assistance program - has not shipped yet but patient has 2 pens of Ozempic on hand.  Assisted patient with resetting her password for MyChart  Plan: F/U in 1 month to check 2023 patient assistance program status for Ozempic  Subjective: Sierra Ford is an 81 y.o. year old female who is a primary patient of Ann Held, DO.  The CCM team was consulted for assistance with disease management and care coordination needs.    Engaged with patient by telephone for follow up visit in response to provider referral for pharmacy case management and/or care coordination services.   Consent to Services:  The patient was given information about Chronic Care Management services, agreed to services, and gave verbal consent prior to initiation of services.  Please see initial visit note for detailed documentation.   Patient Care Team: Carollee Herter, Alferd Apa, DO as PCP - General Minus Breeding, MD as PCP - Cardiology (Cardiology) Maisie Fus, MD as Consulting Physician (Obstetrics and Gynecology) Cherre Robins, RPH-CPP (Pharmacist) Rigoberto Noel, MD as Consulting Physician (Pulmonary Disease)  Recent office visits: 03/07/2021 - PCP (Dr Carollee Herter) Annual Exam. No medication changes. Labs checked.  12/29/2020 - Fam Med (Dr Carollee Herter) Office visit for cerumen impaction and immunization. Ears irragated. Received flu vaccine.  12/20/2020 - Fam Med (Dr Carollee Herter) Video Visit for Bronchitis. Prescribed azithromycin for 5 days. OTC mucinex as needed for cough  Recent consult visits: 11/03/2020 - Ortho (Dr Gladstone Lighter) left should pain and low back pain 10/11/2020 - Pulm (Dr Elsworth Soho) F/U OSA. Off supplemental O2 during day. Using CPAP at night. Provided handicapped sticker.   Hospital visits: None in previous 6  months  Objective:  Lab Results  Component Value Date   CREATININE 0.77 03/07/2021   CREATININE 0.74 09/16/2020   CREATININE 0.83 08/11/2020    Lab Results  Component Value Date   HGBA1C 6.6 (H) 03/07/2021   Last diabetic Eye exam:  Lab Results  Component Value Date/Time   HMDIABEYEEXA No Retinopathy 11/05/2019 12:00 AM    Last diabetic Foot exam: No results found for: HMDIABFOOTEX      Component Value Date/Time   CHOL 218 (H) 03/07/2021 1106   CHOL 209 (H) 08/26/2018 1018   TRIG 54.0 03/07/2021 1106   HDL 137.40 03/07/2021 1106   HDL 101 08/26/2018 1018   CHOLHDL 2 03/07/2021 1106   VLDL 10.8 03/07/2021 1106   LDLCALC 70 03/07/2021 1106   LDLCALC 69 11/10/2019 1122   LDLDIRECT 95.9 10/06/2012 1248    Hepatic Function Latest Ref Rng & Units 03/07/2021 09/16/2020 08/11/2020  Total Protein 6.0 - 8.3 g/dL 6.8 6.7 7.0  Albumin 3.5 - 5.2 g/dL 4.2 4.0 4.6  AST 0 - 37 U/L _0 ALT 0 - 35 U/L _1 Alk Phosphatase 39 - 117 U/L 67 72 76  Total Bilirubin 0.2 - 1.2 mg/dL 1.0 0.8 1.3(H)  Bilirubin, Direct 0.00 - 0.40 mg/dL - - -    Lab Results  Component Value Date/Time   TSH 1.250 05/27/2017 12:24 PM   TSH 1.27 12/28/2016 12:00 AM   TSH 1.00 01/03/2015 12:00 AM   TSH 1.61 08/07/2007 09:38 AM   FREET4 1.05 05/27/2017 12:24 PM    CBC Latest Ref Rng & Units 03/07/2021 01/31/2019 01/30/2019  WBC 4.0 -  10.5 K/uL 5.3 5.3 5.6  Hemoglobin 12.0 - 15.0 g/dL 14.1 12.0 12.9  Hematocrit 36.0 - 46.0 % 42.7 36.2 38.2  Platelets 150.0 - 400.0 K/uL 224.0 202 175    Lab Results  Component Value Date/Time   VD25OH 39.1 01/28/2018 09:24 AM   VD25OH 45.9 05/27/2017 12:24 PM    Clinical ASCVD: Yes  The ASCVD Risk score (Arnett DK, et al., 2019) failed to calculate for the following reasons:   The valid HDL cholesterol range is 20 to 100 mg/dL     Social History   Tobacco Use  Smoking Status Former   Packs/day: 1.00   Years: 50.00   Pack years: 50.00   Types:  Cigarettes   Quit date: 11/21/2009   Years since quitting: 11.3  Smokeless Tobacco Never   BP Readings from Last 3 Encounters:  03/07/21 110/78  01/31/21 138/72  12/29/20 130/70   Pulse Readings from Last 3 Encounters:  03/07/21 65  01/31/21 64  12/29/20 65   Wt Readings from Last 3 Encounters:  03/07/21 192 lb (87.1 kg)  01/31/21 184 lb 12.8 oz (83.8 kg)  12/29/20 187 lb 6.4 oz (85 kg)    Assessment: Review of patient past medical history, allergies, medications, health status, including review of consultants reports, laboratory and other test data, was performed as part of comprehensive evaluation and provision of chronic care management services.   SDOH:  (Social Determinants of Health) assessments and interventions performed:  SDOH Interventions    Flowsheet Row Most Recent Value  SDOH Interventions   Financial Strain Interventions Other (Comment)  [assisting patient with reapplication for patient assistance for Eliquis, Ozempic and [place]  No Known Allergies  Medications Reviewed Today     Reviewed by Cherre Robins, RPH-CPP (Pharmacist) on 03/20/21 at 45  Med List Status: <None>   Medication Order Taking? Sig Documenting Provider Last Dose Status Informant  albuterol (VENTOLIN HFA) 108 (90 Base) MCG/ACT inhaler [id number] Yes Inhale 2 puffs into the lungs every 6 (six) hours as needed for wheezing or shortness of breath. Rigoberto Noel, MD Taking Active   apixaban (ELIQUIS) 5 MG TABS tablet [id number] Yes Take 1 tablet (5 mg total) by mouth 2 (two) times daily. Minus Breeding, MD Taking Active   blood glucose meter kit and supplies KIT [id number] Yes Dispense based on patient and insurance preference. Use up to four times daily as directed. (FOR ICD-9 250.00, 250.01). Ann Held, DO Taking Active Self  CALCIUM-VITAMIN D PO [id number] Yes Take 600 mg by mouth daily. [provider] Taking Active Self  cholecalciferol (VITAMIN  D3) 10 MCG (400 UNIT) TABS tablet [id number] Yes Take 400 Units by mouth daily. [provider] Taking Active Self  Coenzyme Q10 100 MG TABS [id number] Yes Take 100 mg by mouth daily. [provider] Taking Active Self  Flaxseed, Linseed, (FLAXSEED OIL PO) [id number] Yes Take 1,000 mg by mouth daily.  [provider] Taking Active Self  furosemide (LASIX) 40 MG tablet [id number] Yes TAKE 1 TABLET DAILY Minus Breeding, MD Taking Active            Med Note [facility], Jenene Slicker Feb 27, 2021  3:58 PM) Dewaine Conger as needed  Insulin Pen Needle (NOVOFINE PLUS PEN NEEDLE) 32G X 4 MM MISC [id number] Yes As directed Ann Held, DO Taking Active   Multiple Vitamin (MULTIVITAMIN) capsule [id number] Yes Take 1 capsule by mouth daily.  [provider] Taking Active     Discontinued 05/07/11 1124 (Discontinued by provider) Omega-3 Fatty Acids (FISH OIL PO) [id number] Yes Take 1,000 mg by mouth daily. [provider] Taking Active Self  POTASSIUM CHLORIDE PO [id number] Yes Take 1 tablet by mouth as needed (muscle cramps).  [provider] Taking Active Self           Med Note Hali Marry, SARA E   Tue Jan 27, 2019  4:18 PM) Pt states she takes when she takes furosemide to mitigate cramping  PRALUENT 75 MG/ML SOAJ [id number] Yes INJECT 1 PEN INTO THE SKIN EVERY 14 DAYS Minus Breeding, MD Taking Active     Discontinued 05/07/11 1124 (Change in therapy) rosuvastatin (CRESTOR) 10 MG tablet [id number] Yes TAKE 1 TABLET DAILY Hochrein, Jeneen Rinks, MD Taking Active   Semaglutide,0.25 or 0.5MG/DOS, (OZEMPIC, 0.25 OR 0.5 MG/DOSE,) 2 MG/1.5ML SOPN [id number] Yes Inject 0.5 mg into the skin once a week. Carollee Herter, Kendrick Fries R, DO Taking Active   TRUE METRIX BLOOD GLUCOSE TEST test strip [id number] Yes TEST  UP  TO FOUR TIMES DAILY AS DIRECTED Ann Held, DO Taking Active Self  TRUEPLUS LANCETS 33G MISC [id number] Yes TEST  UP TO FOUR TIMES DAILY AS DIRECTED Ann Held,  DO Taking Active Self            Patient Active Problem List   Diagnosis Date Noted   Preventative health care 03/07/2021   Sacroiliitis ([place]) 03/07/2021   Morbid (severe) obesity due to excess calories ([place]) 03/07/2021   Bronchitis 12/20/2020   Other pulmonary embolism with acute cor pulmonale, unspecified chronicity ([place])    Educated about COVID-19 virus infection 01/26/2019   Hyperlipidemia associated with type 2 diabetes mellitus ([place]) 04/24/2018   Right ankle swelling 05/13/2017   Diabetes mellitus, type II ([place]) 04/08/2017   Essential hypertension 04/08/2017   Atherosclerosis of aorta ([place]) 04/08/2017   Abnormal auditory perception of both ears 10/01/2016   Bilateral impacted cerumen 10/01/2016   OSA (obstructive sleep apnea) 12/16/2015   DOE (dyspnea on exertion) 06/12/2013   Obesity (BMI 30-39.9) 04/24/2013   Chest pain, unspecified 01/22/2011   Abnormal stress test 01/22/2011   Frequent urination 01/22/2011   Chronic diastolic heart failure ([place]) 05/15/2009   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   RASH-NONVESICULAR 04/20/2009   CERVICAL STRAIN, WITH RADICULOPATHY 03/31/2009   CAROTID ARTERY DISEASE 01/12/2009   CERVICAL STRAIN 01/11/2009   LEG EDEMA, RIGHT 07/27/2008   HX, URINARY INFECTION 09/19/2006   Hyperlipidemia LDL goal <70 08/20/2006   PREGNANCY, ECTOPIC NEC W/INTRAUTERINE PRG 08/20/2006   FREQUENCY, URINARY 08/20/2006   Other specified abnormal findings of blood chemistry 08/20/2006   Personal history of venous thrombosis and embolism 08/20/2006    Immunization History  Administered Date(s) Administered   Fluad Quad(high Dose 65+) 12/04/2019, 12/29/2020   Influenza, High Dose Seasonal PF 12/22/2018   PFIZER Comirnaty(Gray Top)Covid-19 Tri-Sucrose Vaccine 06/08/2020   PFIZER(Purple Top)SARS-COV-2 Vaccination 03/06/2019, 03/27/2019, 11/10/2019   Pfizer Covid-19 Vaccine Bivalent Booster 21yr & up 12/06/2020   Pneumococcal Conjugate-13  04/24/2013   Pneumococcal Polysaccharide-23 08/07/2007, 12/04/2019   Td 03/03/2003   Tdap 11/06/2018   Zoster Recombinat (Shingrix) 11/06/2018, 05/11/2019   Zoster, Live 08/14/2007, 11/13/2007    Conditions to be addressed/monitored: CHF, CAD, HTN, HLD, DMII, and history of pulmonary embolism  Care Plan : General Pharmacy (Adult)  Updates made by ECherre Robins RPH-CPP since 03/20/2021 12:00 AM     Problem: type 2 DM; Hyperlipidemia; CHF: h/o PE;  obesity; OSA      Long-Range Goal: Long Range pharmacy goals for chronic conditions and medication management   Start Date: 08/17/2020  Recent Progress: On track  Priority: High  Note:   Current Barriers:  Unable to independently afford treatment regimen   Pharmacist Clinical Goal(s):  Over the next 180 days, patient will verbalize ability to afford treatment regimen maintain control of DM, BP and cholesterol as evidenced by maintaining goals listed below  adhere to prescribed medication regimen as evidenced by fill hisory  through collaboration with PharmD and provider.   Interventions: 1:1 collaboration with Carollee Herter, Alferd Apa, DO regarding development and update of comprehensive plan of care as evidenced by provider attestation and co-signature Inter-disciplinary care team collaboration (see longitudinal plan of care) Comprehensive medication review performed; medication list updated in electronic medical record   Hypertension Screening Controlled;  BP goal <130/80 Does not currently check blood pressure at home Current regimen:  Diet and exercise management   Interventions: Discussed BP goal  Hyperlipidemia Controlled; LDL goal < 70 Current regimen:  Rosuvastatin 33m daily Praluent 739mevery 14 days  Flaxseed Oil 100066maily CoEnzyme Q10 daily  Patient receives assistance with Praluent cost from [employer] she reports that she has received notification that she was approved for 2023.   Interventions: Discussed LDL goal Maintain cholesterol medication regimen.  Notify clinical pharmacist if any future issues regarding cost of Praulent or [employer]  Diabetes Controlled;  A1c goal <7% Current regimen:  Ozempic 0.5mg27mbcutaneously once per week Tolerating Ozempic without nausea Received Ozempic form [employer] assistance in 2022. Completed application for patient assistance program. Patient has [employer] Medicare part B and A government employee plan for prescriptions but verified that cost of Ozempic would be $200 on he GEHA plan.  Verified patient was approved for patient assistance program for Ozempic thru 06/03/2021. Has not shipped yet and [employer] is having some shipping delays. They were not able to supply a tracking number yet. Patient states she has about 2 months of Ozempic on hand. (2 pens) Exercise: doing either water aerobics or treadmill 5 days per week Diet: limiting serving sizes (easier with Ozempic) and sugar intake. She has also decreased intake of red meat.  Interventions: Discussed A1c  goal Reviewed home blood glucose goals  Fasting blood glucose goal (before meals) = 80 to 130 Blood glucose goal after a meal = less than 180 : Continue to check blood sugar once daily, document, and provide at future appointments Discussed possibly increasing Ozempic to 1mg 64mfuture for more weight loss effects if able to tolerate.   Osteoporosis Screening / Bone Health Goal: reduce risk of fracture due to osteopenia/osteoporosis Current regimen:  Calcium/Vitamin 600mg/84munits twice daily  Vitamin D 400mg t47m daily Recent serum calcium elevated at 10.9 Interventions: Recommend lowering dose of calcium due to recent elevated serum calcium Decrease calcium supplement to take just 1 tablet daily  Continue current vitamin D supplementation  Health Maintenance:  Patient had mammogram 07/15/2020 but no records in EMR [facility]has to leave message on VM of medical records department) - completed at last visit. HM now showing mammogram was done and results on file in EMR. [facility] - was done 11/13/2020 per patient. [facility] Has to leave message on VM) -  completed at last visit. HM now showing mammogram was done and results on file in EMR.   Medication management [facility] Patient is taking Eliquis for history of PE. She has been receiving from patient assistance recently. She is working with cardiology office on this Interventions Comprehensive medication review performed. Continue current medication management strategy   Patient Goals/Self-Care Activities Over the next 180 days, patient will:  take medications as prescribed,  check glucose daily , document, and provide at future appointments Engage in dietary modifications by limiting CHO serving sizes;  Continue to get at least 150 minutes of exercise weekly  Follow Up Plan: Telephone follow up appointment with care management team member scheduled for:  1 month to check on 2023 patient assistance programs         Medication Assistance:  Received Eliquis and Ozempic through patient assistance program in 2022.  Praluent through [employer] approved for patient assistance program thru 06/03/2021  Eliquis 1470 application initially denied. Patient is contacting cardiology office since they sent in for her.  Approved per patient for [employer] for [employer] for 2023.   Patient's preferred pharmacy is:  [facility] [place] [place] [facility] [address] [facility] [address] [place] [address] Phone: [phone] Fax: [phone]  [id number]Phone: [phone]: [phone] [place] [address] [place] [address]Phone: [phone]: [address] [place]Phone: [phone]: [address] [id number]: [phone]: [phone]   Follow Up:  Patient agrees to Care Plan and Follow-up.  Plan: Telephone follow up appointment with care management team member scheduled for:  1 month  TCherre Robins PharmD Clinical Pharmacist [phone]

## 2021-03-21 DIAGNOSIS — E1169 Type 2 diabetes mellitus with other specified complication: Secondary | ICD-10-CM

## 2021-03-21 DIAGNOSIS — I10 Essential (primary) hypertension: Secondary | ICD-10-CM

## 2021-03-21 DIAGNOSIS — E1165 Type 2 diabetes mellitus with hyperglycemia: Secondary | ICD-10-CM

## 2021-03-21 DIAGNOSIS — E785 Hyperlipidemia, unspecified: Secondary | ICD-10-CM

## 2021-03-29 DIAGNOSIS — M25512 Pain in left shoulder: Secondary | ICD-10-CM | POA: Diagnosis not present

## 2021-03-31 ENCOUNTER — Ambulatory Visit: Payer: Medicare PPO | Admitting: Cardiology

## 2021-04-04 ENCOUNTER — Encounter: Payer: Self-pay | Admitting: Gastroenterology

## 2021-04-04 ENCOUNTER — Ambulatory Visit (INDEPENDENT_AMBULATORY_CARE_PROVIDER_SITE_OTHER): Payer: Medicare PPO | Admitting: Gastroenterology

## 2021-04-04 ENCOUNTER — Ambulatory Visit (INDEPENDENT_AMBULATORY_CARE_PROVIDER_SITE_OTHER): Payer: Medicare PPO | Admitting: Pharmacist

## 2021-04-04 ENCOUNTER — Other Ambulatory Visit (INDEPENDENT_AMBULATORY_CARE_PROVIDER_SITE_OTHER): Payer: Medicare PPO

## 2021-04-04 VITALS — BP 130/78 | HR 66 | Ht 62.0 in | Wt 189.1 lb

## 2021-04-04 DIAGNOSIS — E669 Obesity, unspecified: Secondary | ICD-10-CM

## 2021-04-04 DIAGNOSIS — K648 Other hemorrhoids: Secondary | ICD-10-CM | POA: Diagnosis not present

## 2021-04-04 DIAGNOSIS — K625 Hemorrhage of anus and rectum: Secondary | ICD-10-CM

## 2021-04-04 DIAGNOSIS — R1030 Lower abdominal pain, unspecified: Secondary | ICD-10-CM | POA: Insufficient documentation

## 2021-04-04 DIAGNOSIS — R194 Change in bowel habit: Secondary | ICD-10-CM | POA: Insufficient documentation

## 2021-04-04 DIAGNOSIS — E1165 Type 2 diabetes mellitus with hyperglycemia: Secondary | ICD-10-CM

## 2021-04-04 MED ORDER — HYDROCORTISONE ACETATE 25 MG RE SUPP: 25.0000 mg | Freq: Two times a day (BID) | RECTAL | 1 refills | Status: DC

## 2021-04-04 NOTE — Patient Instructions (Signed)
Sierra Ford It was a pleasure speaking with you today.  I have attached a summary of our visit today and information about your health goals.   If you have any questions or concerns, please feel free to contact me either at the phone number below or with a MyChart message.   Keep up the good work!  Cherre Robins, PharmD Clinical Pharmacist [facility] [phone] (direct line)  [phone] (main office number)    CARE PLAN ENTRY (see longitudinal plan of care for additional care plan information)  Current Barriers:  Chronic Disease Management support, education, and care coordination needs related to Hypertension, Hyperlipidemia/Atherosclerosis of Aorta, Diabetes, Heart Failure, History of PE, Osteoarthritis   Hypertension BP Readings from Last 3 Encounters:  03/07/21 110/78  01/31/21 138/72  12/29/20 130/70   Pharmacist Clinical Goal(s): Over the next 180 days, patient will work with PharmD and providers to maintain BP goal <130/80 Current regimen:  Diet and exercise management   Interventions: Discussed blood pressure  goal Patient self care activities - Over the next 180 days, patient will: Maintain blood pressure  less than 130/80  Hyperlipidemia Lab Results  Component Value Date/Time   LDLCALC 70 03/07/2021 11:06 AM   LDLCALC 69 11/10/2019 11:22 AM   LDLDIRECT 95.9 10/06/2012 12:48 PM   Pharmacist Clinical Goal(s): Over the next 180 days, patient will work with PharmD and providers to maintain LDL goal < 70 Current regimen:  Rosuvastatin 10mg  daily Praluent 75mg  every 14 days  Flaxseed Oil 1000mg  daily CoEnzyme Q10 daily  Interventions: Discussed LDL goal Patient self care activities - Over the next 180 days, patient will: Maintain cholesterol medication regimen.  Notify clinical pharmacist if any future issues regarding cost of Praulent or [place]  Diabetes Lab Results  Component Value Date/Time   HGBA1C  6.6 (H) 03/07/2021 11:06 AM   HGBA1C 6.7 (H) 08/11/2020 11:14 AM   HGBA1C 6.7 12/28/2016 12:00 AM   Pharmacist Clinical Goal(s): Over the next 180 days, patient will work with PharmD and providers to maintain A1c goal <7% Current regimen:  Ozempic 0.5mg  subcutaneously once per week Interventions: Discussed A1c  goal Called patient assistance program for Ozempic - patient approved thru 06/03/2021 but has not shipped yet. Reviewed home blood glucose goals  Fasting blood glucose goal (before meals) = 80 to 130 Blood glucose goal after a meal = less than 180  Patient self care activities - Over the next 180 days, patient will: Check blood sugar once daily, document, and provide at future appointments Contact provider with any episodes of hypoglycemia  Osteopenia/Osteoporosis Screening Pharmacist Clinical Goal(s) Over the next 180 days, patient will work with PharmD and providers to reduce risk of fracture due to osteopenia/osteoporosis Current regimen:  Calcium/Vitamin 600mg /800 units twice daily  Vitamin D 400mg  twice daily Interventions: Recommend lowering dose of calcium due to recent labs that showed elevated serum calcium Discussed intake of [phone] units of vitamin D through supplementation  Patient self care activities - Over the next 180 days, patient will: Decrease calcium supplement to take just 1 tablet daily  Continue current vitamin D supplementation  Medication management Pharmacist Clinical Goal(s): Over the next 180 days, patient will work with PharmD and providers to maintain optimal medication adherence [facility] Interventions Comprehensive medication review performed. Continue current medication management strategy Patient self care activities - Over the next 180 days, patient will: Focus on medication adherence by filling and taking medications appropriately  Take  medications as prescribed Report any questions or concerns to  PharmD and/or provider(s) Patient verbalizes understanding of instructions and care plan provided today and agrees to view in [facility]. Active MyChart status confirmed with patient.

## 2021-04-04 NOTE — Chronic Care Management (AMB) (Signed)
Chronic Care Management Pharmacy Note  04/04/2021 Name:  Sierra Ford MRN:  [id number] DOB:  10/28/1940  Summary: Verified patient was approved for Ozempic patient assistance program - has not shipped yet but patient has 1 full pen and 1 partial pen of Ozempic on hand.  Per [employer] patient assistance program - expect that first shipment of Ozempic will be sent around 04/11/2021  Plan: F/U in 1 month to check 2023 patient assistance program status for Ozempic. Patient will call if samples needed.   Subjective: Sierra Ford is an 81 y.o. year old female who is a primary patient of Ann Held, DO.  The CCM team was consulted for assistance with disease management and care coordination needs.    Collaboration with patient assistance program - Product manager  for  Ozempic  in response to provider referral for pharmacy case management and/or care coordination services.   Consent to Services:  The patient was given information about Chronic Care Management services, agreed to services, and gave verbal consent prior to initiation of services.  Please see initial visit note for detailed documentation.   Patient Care Team: Carollee Herter, Alferd Apa, DO as PCP - General Minus Breeding, MD as PCP - Cardiology (Cardiology) Maisie Fus, MD as Consulting Physician (Obstetrics and Gynecology) Cherre Robins, RPH-CPP (Pharmacist) Rigoberto Noel, MD as Consulting Physician (Pulmonary Disease)  Recent office visits: 03/07/2021 - PCP (Dr Carollee Herter) Annual Exam. No medication changes. Labs checked.  12/29/2020 - Fam Med (Dr Carollee Herter) Office visit for cerumen impaction and immunization. Ears irragated. Received flu vaccine.  12/20/2020 - Fam Med (Dr Carollee Herter) Video Visit for Bronchitis. Prescribed azithromycin for 5 days. OTC mucinex as needed for cough  Recent consult visits: 11/03/2020 - Ortho (Dr Gladstone Lighter) left should pain and low back pain 10/11/2020 - Pulm (Dr Elsworth Soho) F/U OSA.  Off supplemental O2 during day. Using CPAP at night. Provided handicapped sticker.   Hospital visits: None in previous 6 months  Objective:  Lab Results  Component Value Date   CREATININE 0.77 03/07/2021   CREATININE 0.74 09/16/2020   CREATININE 0.83 08/11/2020    Lab Results  Component Value Date   HGBA1C 6.6 (H) 03/07/2021   Last diabetic Eye exam:  Lab Results  Component Value Date/Time   HMDIABEYEEXA No Retinopathy 11/05/2019 12:00 AM    Last diabetic Foot exam: No results found for: HMDIABFOOTEX      Component Value Date/Time   CHOL 218 (H) 03/07/2021 1106   CHOL 209 (H) 08/26/2018 1018   TRIG 54.0 03/07/2021 1106   HDL 137.40 03/07/2021 1106   HDL 101 08/26/2018 1018   CHOLHDL 2 03/07/2021 1106   VLDL 10.8 03/07/2021 1106   LDLCALC 70 03/07/2021 1106   LDLCALC 69 11/10/2019 1122   LDLDIRECT 95.9 10/06/2012 1248    Hepatic Function Latest Ref Rng & Units 03/07/2021 09/16/2020 08/11/2020  Total Protein 6.0 - 8.3 g/dL 6.8 6.7 7.0  Albumin 3.5 - 5.2 g/dL 4.2 4.0 4.6  AST 0 - 37 U/L _0 ALT 0 - 35 U/L _1 Alk Phosphatase 39 - 117 U/L 67 72 76  Total Bilirubin 0.2 - 1.2 mg/dL 1.0 0.8 1.3(H)  Bilirubin, Direct 0.00 - 0.40 mg/dL - - -    Lab Results  Component Value Date/Time   TSH 1.250 05/27/2017 12:24 PM   TSH 1.27 12/28/2016 12:00 AM   TSH 1.00 01/03/2015 12:00 AM   TSH 1.61  08/07/2007 09:38 AM   FREET4 1.05 05/27/2017 12:24 PM    CBC Latest Ref Rng & Units 03/07/2021 01/31/2019 01/30/2019  WBC 4.0 - 10.5 K/uL 5.3 5.3 5.6  Hemoglobin 12.0 - 15.0 g/dL 14.1 12.0 12.9  Hematocrit 36.0 - 46.0 % 42.7 36.2 38.2  Platelets 150.0 - 400.0 K/uL 224.0 202 175    Lab Results  Component Value Date/Time   VD25OH 39.1 01/28/2018 09:24 AM   VD25OH 45.9 05/27/2017 12:24 PM    Clinical ASCVD: Yes  The ASCVD Risk score (Arnett DK, et al., 2019) failed to calculate for the following reasons:   The valid HDL cholesterol range is 20 to 100 mg/dL      Social History   Tobacco Use  Smoking Status Former   Packs/day: 1.00   Years: 50.00   Pack years: 50.00   Types: Cigarettes   Quit date: 11/21/2009   Years since quitting: 11.3  Smokeless Tobacco Never   BP Readings from Last 3 Encounters:  03/07/21 110/78  01/31/21 138/72  12/29/20 130/70   Pulse Readings from Last 3 Encounters:  03/07/21 65  01/31/21 64  12/29/20 65   Wt Readings from Last 3 Encounters:  03/07/21 192 lb (87.1 kg)  01/31/21 184 lb 12.8 oz (83.8 kg)  12/29/20 187 lb 6.4 oz (85 kg)    Assessment: Review of patient past medical history, allergies, medications, health status, including review of consultants reports, laboratory and other test data, was performed as part of comprehensive evaluation and provision of chronic care management services.   SDOH:  (Social Determinants of Health) assessments and interventions performed:     CCM Care Plan  No Known Allergies  Medications Reviewed Today     Reviewed by Cherre Robins, RPH-CPP (Pharmacist) on 04/04/21 at 71  Med List Status: <None>   Medication Order Taking? Sig Documenting Provider Last Dose Status Informant  albuterol (VENTOLIN HFA) 108 (90 Base) MCG/ACT inhaler [id number] No Inhale 2 puffs into the lungs every 6 (six) hours as needed for wheezing or shortness of breath. Rigoberto Noel, MD Taking Active   apixaban (ELIQUIS) 5 MG TABS tablet [id number] No Take 1 tablet (5 mg total) by mouth 2 (two) times daily. Minus Breeding, MD Taking Active   blood glucose meter kit and supplies KIT [id number] No Dispense based on patient and insurance preference. Use up to four times daily as directed. (FOR ICD-9 250.00, 250.01). Ann Held, DO Taking Active Self  CALCIUM-VITAMIN D PO [id number] No Take 600 mg by mouth daily. [provider] Taking Active Self  cholecalciferol (VITAMIN D3) 10 MCG (400 UNIT) TABS tablet [id number] No Take 400 Units by mouth daily. [provider]  Taking Active Self  Coenzyme Q10 100 MG TABS [id number] No Take 100 mg by mouth daily. [provider] Taking Active Self  Flaxseed, Linseed, (FLAXSEED OIL PO) [id number] No Take 1,000 mg by mouth daily.  [provider] Taking Active Self  furosemide (LASIX) 40 MG tablet [id number] No TAKE 1 TABLET DAILY Minus Breeding, MD Taking Active            Med Note [facility], Jenene Slicker Feb 27, 2021  3:58 PM) Dewaine Conger as needed  Insulin Pen Needle (NOVOFINE PLUS PEN NEEDLE) 32G X 4 MM MISC [id number] No As directed Ann Held, DO Taking Active   Multiple Vitamin (MULTIVITAMIN) capsule [id number] No Take 1 capsule by mouth daily. [provider] Taking Active   Discontinued 05/07/11 1124 (  Discontinued by provider) Omega-3 Fatty Acids (FISH OIL PO) [id number] No Take 1,000 mg by mouth daily. [provider] Taking Active Self  POTASSIUM CHLORIDE PO [id number] No Take 1 tablet by mouth as needed (muscle cramps).  [provider] Taking Active Self           Med Note Hali Marry, SARA E   Tue Jan 27, 2019  4:18 PM) Pt states she takes when she takes furosemide to mitigate cramping  PRALUENT 75 MG/ML SOAJ [id number] No INJECT 1 PEN INTO THE SKIN EVERY 14 DAYS Minus Breeding, MD Taking Active   Discontinued 05/07/11 1124 (Change in therapy) rosuvastatin (CRESTOR) 10 MG tablet [id number] No TAKE 1 TABLET DAILY Hochrein, Jeneen Rinks, MD Taking Active   Semaglutide,0.25 or 0.5MG/DOS, (OZEMPIC, 0.25 OR 0.5 MG/DOSE,) 2 MG/1.5ML SOPN [id number] No Inject 0.5 mg into the skin once a week. Ann Held, DO Taking Active            Med Note Antony Contras, Sandre Kitty   Tue Apr 04, 2021 12:20 PM) Approved thru [employer] patient assistance program thru 06/03/2021  TRUE METRIX BLOOD GLUCOSE TEST test strip [id number] No TEST  UP  TO FOUR TIMES DAILY AS DIRECTED Ann Held, DO Taking Active Self  TRUEPLUS LANCETS 33G MISC [id number] No TEST  UP TO FOUR TIMES DAILY AS DIRECTED Ann Held, DO Taking Active Self            Patient Active Problem List   Diagnosis Date Noted   Preventative health care 03/07/2021   Sacroiliitis ([place]) 03/07/2021   Morbid (severe) obesity due to excess calories ([place]) 03/07/2021   Bronchitis 12/20/2020   Other pulmonary embolism with acute cor pulmonale, unspecified chronicity ([place])    Educated about COVID-19 virus infection 01/26/2019   Hyperlipidemia associated with type 2 diabetes mellitus ([place]) 04/24/2018   Right ankle swelling 05/13/2017   Diabetes mellitus, type II ([place]) 04/08/2017   Essential hypertension 04/08/2017   Atherosclerosis of aorta ([place]) 04/08/2017   Abnormal auditory perception of both ears 10/01/2016   Bilateral impacted cerumen 10/01/2016   OSA (obstructive sleep apnea) 12/16/2015   DOE (dyspnea on exertion) 06/12/2013   Obesity (BMI 30-39.9) 04/24/2013   Chest pain, unspecified 01/22/2011   Abnormal stress test 01/22/2011   Frequent urination 01/22/2011   Chronic diastolic heart failure ([place]) 05/15/2009   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   RASH-NONVESICULAR 04/20/2009   CERVICAL STRAIN, WITH RADICULOPATHY 03/31/2009   CAROTID ARTERY DISEASE 01/12/2009   CERVICAL STRAIN 01/11/2009   LEG EDEMA, RIGHT 07/27/2008   HX, URINARY INFECTION 09/19/2006   Hyperlipidemia LDL goal <70 08/20/2006   PREGNANCY, ECTOPIC NEC W/INTRAUTERINE PRG 08/20/2006   FREQUENCY, URINARY 08/20/2006   Other specified abnormal findings of blood chemistry 08/20/2006   Personal history of venous thrombosis and embolism 08/20/2006    Immunization History  Administered Date(s) Administered   Fluad Quad(high Dose 65+) 12/04/2019, 12/29/2020   Influenza, High Dose Seasonal PF 12/22/2018   PFIZER Comirnaty(Gray Top)Covid-19 Tri-Sucrose Vaccine 06/08/2020   PFIZER(Purple Top)SARS-COV-2 Vaccination 03/06/2019, 03/27/2019, 11/10/2019   Pfizer Covid-19 Vaccine Bivalent Booster 21yr & up 12/06/2020   Pneumococcal  Conjugate-13 04/24/2013   Pneumococcal Polysaccharide-23 08/07/2007, 12/04/2019   Td 03/03/2003   Tdap 11/06/2018   Zoster Recombinat (Shingrix) 11/06/2018, 05/11/2019   Zoster, Live 08/14/2007, 11/13/2007    Conditions to be addressed/monitored: CHF, CAD, HTN, HLD, DMII, and history of pulmonary embolism  Care Plan : General Pharmacy (Adult)  Updates made by ECherre Robins  RPH-CPP since 04/04/2021 12:00 AM     Problem: type 2 DM; Hyperlipidemia; CHF: h/o PE; obesity; OSA      Long-Range Goal: Long Range pharmacy goals for chronic conditions and medication management   Start Date: 08/17/2020  Recent Progress: On track  Priority: High  Note:   Current Barriers:  Unable to independently afford treatment regimen   Pharmacist Clinical Goal(s):  Over the next 180 days, patient will verbalize ability to afford treatment regimen maintain control of DM, BP and cholesterol as evidenced by maintaining goals listed below  adhere to prescribed medication regimen as evidenced by fill hisory  through collaboration with PharmD and provider.   Interventions: 1:1 collaboration with Carollee Herter, Alferd Apa, DO regarding development and update of comprehensive plan of care as evidenced by provider attestation and co-signature Inter-disciplinary care team collaboration (see longitudinal plan of care) Comprehensive medication review performed; medication list updated in electronic medical record   Hypertension Screening Controlled;  BP goal <130/80 Does not currently check blood pressure at home Current regimen:  Diet and exercise management   Interventions: Discussed BP goal  Hyperlipidemia Controlled; LDL goal < 70 Current regimen:  Rosuvastatin 17m daily Praluent 790mevery 14 days  Flaxseed Oil 100033maily CoEnzyme Q10 daily  Patient receives assistance with Praluent cost from [employer] she reports that she has received notification that she was approved for 2023.   Interventions: Discussed LDL goal Maintain cholesterol medication regimen.  Notify clinical pharmacist if any future issues regarding cost of [employer] or [employer]  Diabetes Controlled;  A1c goal <7% Current regimen:  Ozempic 0.5mg54mbcutaneously once per week Tolerating Ozempic without nausea Received Ozempic form [employer] [place] assistance in 2022. Completed application for patient assistance program. Patient has [employer] Medicare part B and A government employee plan for prescriptions but verified that cost of Ozempic would be $200 on he GEHA plan.  Verified patient was approved for patient assistance program for Ozempic thru 06/03/2021. Has not shipped yet and [employer] is having some shipping delays. They were not able to supply a tracking number yet. Patient states she has about 1 months of Ozempic on hand. (1 pens) Exercise: doing either water aerobics or treadmill 5 days per week Diet: limiting serving sizes (easier with Ozempic) and sugar intake. She has also decreased intake of red meat.  Interventions: Discussed A1c  goal Reviewed home blood glucose goals  Fasting blood glucose goal (before meals) = 80 to 130 Blood glucose goal after a meal = less than 180 : Continue to check blood sugar once daily, document, and provide at future appointments Discussed possibly increasing Ozempic to 1mg 14mfuture for more weight loss effects if able to tolerate.   Osteoporosis Screening / Bone Health Goal: reduce risk of fracture due to osteopenia/osteoporosis Current regimen:  Calcium/Vitamin 600mg/1munits twice daily  Vitamin D 400mg t67m daily Recent serum calcium elevated at 10.9 Interventions: Recommend lowering dose of calcium due to recent elevated serum calcium Decrease calcium supplement to take just 1 tablet daily  Continue current vitamin D supplementation  Health Maintenance:  Patient had mammogram 07/15/2020 but no records in EMR [facility]has to leave message on VM of medical records department) - completed at last visit. HM now showing mammogram was done and results on file in EMR. [facility] - was done 11/13/2020 per patient.  Requested [facility] from [facility] for diabetic [facility] exam. Has to leave message on VM) - completed at last visit. HM now showing mammogram was done and results on file in EMR.   Medication management [facility] Patient is taking Eliquis for history of PE. She has been receiving from patient assistance recently. She is working with cardiology office on this Interventions Comprehensive medication review performed. Continue current medication management strategy   Patient Goals/Self-Care Activities Over the next 180 days, patient will:  take medications as prescribed,  check glucose daily , document, and provide at future appointments Engage in dietary modifications by limiting CHO serving sizes;  Continue to get at least 150 minutes of exercise weekly  Follow Up Plan: Telephone follow up appointment with care management team member scheduled for:  1 month to check on 2023 patient assistance program         Medication Assistance:  Received Eliquis and Ozempic through patient assistance program in 2022.  Praluent through [employer] approved for patient assistance program thru 06/03/2021 - but due to [employer] delays, first shipmen tin 2023 has not been processed yet. Expect to be processed/ shipped around 04/11/2021.  Eliquis 0737 application initially denied. Patient is contacting cardiology office since they sent in for her.  Approved per patient for [employer] for [employer] for 2023.   Patient's preferred pharmacy is:  [facility] [place] [place] [facility] [address] [facility] [address]  [place] [address] Phone: [phone] Fax: [phone]  [id number]: [phone]: [phone] [place] [address] [address]Phone: [phone]: [address]Phone: [phone]: [address] [address]Phone: [phone]: [phone]   Follow Up:  Patient agrees to Care Plan and Follow-up.  Plan: Telephone follow up appointment with care management team member scheduled for:  1 month  TCherre Robins PharmD Clinical Pharmacist [phone]

## 2021-04-04 NOTE — Progress Notes (Signed)
° ° ° °04/04/2021 °Sierra Ford °[id number] °07/12/1940 ° ° °HISTORY OF PRESENT ILLNESS: This is a 80-year-old female who is a patient of Dr. Nandigam's.  She presents here today with complaints of seeing blood on the toilet paper when wiping, lower abdominal pain, and incomplete bowel movements.  She says that for the past few months she has not been having bowel movements like she used to.  She only passes small amounts of stool at a time and has to go back and forth to the bathroom several times.  She says that last week she had sudden onset of lower abdominal pain that lasted 3 days.  She says that it is still little bit sore.  She also reports bright red blood on the toilet paper upon wiping intermittently for the past few months.  She knows that she has a history of hemorrhoids.  Recent routine labs from last month were unremarkable. ° °Colonoscopy 06/2017: ° °- Non-bleeding internal hemorrhoids. °- Moderate diverticulosis in the sigmoid colon and in the descending colon. °- No specimens collected. ° °Past Medical History:  °Diagnosis Date  ° Anxiety   ° Back pain   ° Chest pain   ° CLite with apical ischemia in 2006 - normal coronary arteries by [facility] in 12/2004;  Myoview 11/12:  Low risk stress nuclear study with a small, partially reversible apical defect most likely related to apical thinning; cannot R/O very mild apical ischemia.  EF: 75%   ° Decreased hearing   ° Depression   ° Diabetes (HCC)   ° DVT (deep venous thrombosis) (HCC) 2006  ° hx of  ° Ectopic pregnancy with intrauterine pregnancy   ° Hiatal hernia   ° Hyperlipidemia   ° PE (pulmonary embolism) 2006  ° hx of  ° Sleep apnea   ° CPAP   ° °Past Surgical History:  °Procedure Laterality Date  ° ABDOMINAL EXPLORATION SURGERY    ° BUNIONECTOMY    ° right  ° COLONOSCOPY  2009  ° ECTOPIC PREGNANCY SURGERY    ° FOOT SURGERY    ° TUBAL LIGATION    ° ° reports that she quit smoking about 11 years ago. Her smoking use included cigarettes. She has a 50.00  pack-year smoking history. She has never used smokeless tobacco. She reports current alcohol use. She reports that she does not use drugs. °family history includes Alcoholism in her father; Colon polyps in her mother; Diabetes in her maternal aunt and maternal aunt; Heart failure in her mother; Hypertension in her mother; Obesity in her mother; Sudden death in her mother. °No Known Allergies ° °  °Outpatient Encounter Medications as of 04/04/2021  °Medication Sig  ° albuterol (VENTOLIN HFA) 108 (90 Base) MCG/ACT inhaler Inhale 2 puffs into the lungs every 6 (six) hours as needed for wheezing or shortness of breath.  ° apixaban (ELIQUIS) 5 MG TABS tablet Take 1 tablet (5 mg total) by mouth 2 (two) times daily.  ° blood glucose meter kit and supplies KIT Dispense based on patient and insurance preference. Use up to four times daily as directed. (FOR ICD-9 250.00, 250.01).  ° CALCIUM-VITAMIN D PO Take 600 mg by mouth daily.  ° cholecalciferol (VITAMIN D3) 10 MCG (400 UNIT) TABS tablet Take 400 Units by mouth daily.  ° Coenzyme Q10 100 MG TABS Take 100 mg by mouth daily.  ° Flaxseed, Linseed, (FLAXSEED OIL PO) Take 1,000 mg by mouth daily.   ° furosemide (LASIX) 40 MG tablet TAKE 1 TABLET   TABLET DAILY   Insulin Pen Needle (NOVOFINE PLUS PEN NEEDLE) 32G X 4 MM MISC As directed   Multiple Vitamin (MULTIVITAMIN) capsule Take 1 capsule by mouth daily.   Omega-3 Fatty Acids (FISH OIL PO) Take 1,000 mg by mouth daily.   POTASSIUM CHLORIDE PO Take 1 tablet by mouth as needed (muscle cramps).    PRALUENT 75 MG/ML SOAJ INJECT 1 PEN INTO THE SKIN EVERY 14 DAYS   rosuvastatin (CRESTOR) 10 MG tablet TAKE 1 TABLET DAILY   Semaglutide,0.25 or 0.5MG/DOS, (OZEMPIC, 0.25 OR 0.5 MG/DOSE,) 2 MG/1.5ML SOPN Inject 0.5 mg into the skin once a week.   TRUE METRIX BLOOD GLUCOSE TEST test strip TEST  UP  TO FOUR TIMES DAILY AS DIRECTED   TRUEPLUS LANCETS 33G MISC TEST  UP TO FOUR TIMES DAILY AS DIRECTED   [DISCONTINUED] omega-3 acid ethyl  esters (LOVAZA) 1 G capsule Take 1 g by mouth 3 (three) times daily.     [DISCONTINUED] pravastatin (PRAVACHOL) 20 MG tablet Take 20 mg by mouth daily.     No facility-administered encounter medications on file as of 04/04/2021.     REVIEW OF SYSTEMS  : All other systems reviewed and negative except where noted in the History of Present Illness.   PHYSICAL EXAM: BP 130/78    Pulse 66    Ht 5' 2" (1.575 m)    Wt 189 lb 2 oz (85.8 kg)    SpO2 95%    BMI 34.59 kg/m  General: Well developed AA female in no acute distress Head: Normocephalic and atraumatic Eyes:  Sclerae anicteric, conjunctiva pink. Ears: Normal auditory acuity Lungs: Clear throughout to auscultation; no W/R/R. Heart: Regular rate and rhythm; no M/R/G. Abdomen: Soft, non-distended.  BS present.  Mild lower abdominal TTP. Rectal:  No external abnormalities noted.  DRE did not reveal any masses.  Anoscopy performed and showed enlarge/inflamed hemorrhoid posteriorly.   Musculoskeletal: Symmetrical with no gross deformities  Skin: No lesions on visible extremities Extremities: No edema  Neurological: Alert oriented x 4, grossly non-focal Psychological:  Alert and cooperative. Normal mood and affect  ASSESSMENT AND PLAN: *Bright rectal bleeding secondary to internal hemorrhoids that are seen previous on colonoscopy and seen inflamed and irritated on anoscopy today in the setting of anticoagulation with Eliquis.  We will treat with hydrocortisone suppositories at bedtime.  Prescription sent to pharmacy.  Hemorrhoid banding would be tricky with her anticoagulation. *Change in bowel habits with incomplete emptying of her stool: Reports passing small amounts of stool at a time, having to go back several times throughout the day.  I have asked her to begin a daily powder fiber supplement in the form of Benefiber 2 teaspoons mixed in 8 ounces of liquid daily. *Lower abdominal pain: Has not really complained of abdominal pain in the  past.  This abdominal pain was present for 3 days last week and is still sore.  With her other symptoms we will proceed with CT scan of the abdomen and pelvis as she has not had any cross-sectional imaging of her abdomen in quite some time.  We will check a BMP today.   CC:  Ann Held, *

## 2021-04-04 NOTE — Patient Instructions (Signed)
We have sent the following medications to your pharmacy for you to pick up at your convenience: Hydrocortisone suppository nightly for 7 days.   Start Benefiber 2 teaspoons in 8 ounces of liquid daily.  Your provider has requested that you go to the basement level for lab work before leaving today. Press "B" on the elevator. The lab is located at the first door on the left as you exit the elevator.  You have been scheduled for a CT scan of the abdomen and pelvis at [facility] ([address]---this is in the same building as [employer]).   You are scheduled on Friday 04/07/21 at 9:30 am. You should arrive 15 minutes prior to your appointment time for registration. Please follow the written instructions below on the day of your exam:  WARNING: IF YOU ARE ALLERGIC TO IODINE/X-RAY DYE, PLEASE NOTIFY RADIOLOGY IMMEDIATELY AT [id number]! YOU WILL BE GIVEN A 13 HOUR PREMEDICATION PREP.  1) Do not eat or drink anything after 5:30 am (4 hours prior to your test) 2) You have been given 2 bottles of oral contrast to drink. The solution may taste better if refrigerated, but do NOT add ice or any other liquid to this solution. Shake well before drinking.    Drink 1 bottle of contrast @ 7:30 am (2 hours prior to your exam)  Drink 1 bottle of contrast @ 8:30 am (1 hour prior to your exam)  You may take any medications as prescribed with a small amount of water, if necessary. If you take any of the following medications: METFORMIN, GLUCOPHAGE, GLUCOVANCE, AVANDAMET, RIOMET, FORTAMET, [place] MET, JANUMET, GLUMETZA or METAGLIP, you MAY be asked to HOLD this medication 48 hours AFTER the exam.  The purpose of you drinking the oral contrast is to aid in the visualization of your intestinal tract. The contrast solution may cause some diarrhea. Depending on your individual set of symptoms, you may also receive an intravenous injection of x-ray contrast/dye. Plan on being at [facility] for 30 minutes or longer, depending on the type of exam you are having performed.  This test typically takes 30-45 minutes to complete.  If you have any questions regarding your exam or if you need to reschedule, you may call the CT department at [phone] between the hours of 8:00 am and 5:00 pm, Monday-Friday.  ___________________________________________________________________  If you are age 80 or older, your body mass index should be between 23-30. Your Body mass index is 34.59 kg/m. If this is out of the aforementioned range listed, please consider follow up with your Primary Care Provider.  If you are age 80 or younger, your body mass index should be between 19-25. Your Body mass index is 34.59 kg/m. If this is out of the aformentioned range listed, please consider follow up with your Primary Care Provider.   ________________________________________________________  The [facility] providers would like to encourage you to use [facility] to communicate with providers for non-urgent requests or questions.  Due to long hold times on the telephone, sending your provider a message by [facility] may be a faster and more efficient way to get a response.  Please allow 48 business hours for a response.  Please remember that this is for non-urgent requests.  _______________________________________________________

## 2021-04-05 LAB — BASIC METABOLIC PANEL
BUN: 17 mg/dL (ref 6–23)
CO2: 31 mEq/L (ref 19–32)
Calcium: 10.9 mg/dL — ABNORMAL HIGH (ref 8.4–10.5)
Chloride: 102 mEq/L (ref 96–112)
Creatinine, Ser: 0.84 mg/dL (ref 0.40–1.20)
GFR: 66.03 mL/min (ref >60.00)
Glucose, Bld: 109 mg/dL — ABNORMAL HIGH (ref 70–99)
Potassium: 4.3 mEq/L (ref 3.5–5.1)
Sodium: 141 mEq/L (ref 135–145)

## 2021-04-06 ENCOUNTER — Telehealth: Payer: Self-pay

## 2021-04-06 DIAGNOSIS — M25512 Pain in left shoulder: Secondary | ICD-10-CM | POA: Diagnosis not present

## 2021-04-06 NOTE — Telephone Encounter (Signed)
Patient called stating she has enough ozempic to last her until her next shipment date

## 2021-04-07 ENCOUNTER — Other Ambulatory Visit: Payer: Self-pay

## 2021-04-07 ENCOUNTER — Ambulatory Visit (INDEPENDENT_AMBULATORY_CARE_PROVIDER_SITE_OTHER)
Admission: RE | Admit: 2021-04-07 | Discharge: 2021-04-07 | Disposition: A | Discharge status: Home / Self Care | Payer: Medicare PPO | Source: Ambulatory Visit | Attending: Gastroenterology | Admitting: Gastroenterology

## 2021-04-07 DIAGNOSIS — I700 Atherosclerosis of aorta: Secondary | ICD-10-CM | POA: Diagnosis not present

## 2021-04-07 DIAGNOSIS — R109 Unspecified abdominal pain: Secondary | ICD-10-CM | POA: Diagnosis not present

## 2021-04-07 DIAGNOSIS — K625 Hemorrhage of anus and rectum: Secondary | ICD-10-CM

## 2021-04-07 DIAGNOSIS — R1030 Lower abdominal pain, unspecified: Secondary | ICD-10-CM

## 2021-04-07 DIAGNOSIS — R194 Change in bowel habit: Secondary | ICD-10-CM

## 2021-04-07 MED ORDER — IOHEXOL 300 MG/ML  SOLN
100.0000 mL | Freq: Once | INTRAMUSCULAR | Status: AC | PRN
  Administered 2021-04-07: 100 mL via INTRAVENOUS

## 2021-04-12 ENCOUNTER — Telehealth: Payer: Self-pay | Admitting: Gastroenterology

## 2021-04-12 NOTE — Telephone Encounter (Signed)
Patient returned your call, please advise. 

## 2021-04-12 NOTE — Telephone Encounter (Addendum)
Patient is calling again to follow up with you on results. Please advise.

## 2021-04-12 NOTE — Telephone Encounter (Signed)
See 2/21 results note

## 2021-04-18 DIAGNOSIS — E1165 Type 2 diabetes mellitus with hyperglycemia: Secondary | ICD-10-CM

## 2021-04-20 ENCOUNTER — Telehealth: Payer: Self-pay | Admitting: Family Medicine

## 2021-04-20 DIAGNOSIS — M25512 Pain in left shoulder: Secondary | ICD-10-CM | POA: Diagnosis not present

## 2021-04-20 DIAGNOSIS — M75122 Complete rotator cuff tear or rupture of left shoulder, not specified as traumatic: Secondary | ICD-10-CM | POA: Diagnosis not present

## 2021-04-20 NOTE — Telephone Encounter (Signed)
Okay to given a sample?  ?

## 2021-04-20 NOTE — Telephone Encounter (Signed)
Pt would like to come inside & get samples for Ozempic since it's taking so long  ? ?Please advise  ?

## 2021-04-20 NOTE — Telephone Encounter (Signed)
Pt called. LVM that we had a sample available and wanted to confirm the dosage she is on. Sample set aside in [place] for patient.  ?

## 2021-04-21 ENCOUNTER — Ambulatory Visit: Payer: Medicare PPO | Admitting: Cardiology

## 2021-04-21 ENCOUNTER — Ambulatory Visit (INDEPENDENT_AMBULATORY_CARE_PROVIDER_SITE_OTHER): Payer: Medicare PPO | Admitting: Pharmacist

## 2021-04-21 DIAGNOSIS — E1169 Type 2 diabetes mellitus with other specified complication: Secondary | ICD-10-CM

## 2021-04-21 DIAGNOSIS — E669 Obesity, unspecified: Secondary | ICD-10-CM

## 2021-04-21 DIAGNOSIS — I10 Essential (primary) hypertension: Secondary | ICD-10-CM

## 2021-04-21 DIAGNOSIS — E1165 Type 2 diabetes mellitus with hyperglycemia: Secondary | ICD-10-CM

## 2021-04-21 NOTE — Chronic Care Management (AMB) (Signed)
Chronic Care Management Pharmacy Note  04/21/2021 Name:  Sierra Ford MRN:  [id number] DOB:  August 29, 1940  Summary: Verified patient was approved for Ozempic patient assistance program - has not shipped yet. Provided 04/20/2021 with 1 month sample for Ozempic  Reviewed adherence with other medications  Subjective: Sierra Ford is an 81 y.o. year old female who is a primary patient of Ann Held, DO.  The CCM team was consulted for assistance with disease management and care coordination needs.    Collaboration with Patient and [employer] patient assistance program   for  Chronic Care Management follow up  in response to provider referral for pharmacy case management and/or care coordination services.   Consent to Services:  The patient was given information about Chronic Care Management services, agreed to services, and gave verbal consent prior to initiation of services.  Please see initial visit note for detailed documentation.   Patient Care Team: Carollee Herter, Alferd Apa, DO as PCP - General Minus Breeding, MD as PCP - Cardiology (Cardiology) Maisie Fus, MD as Consulting Physician (Obstetrics and Gynecology) Cherre Robins, RPH-CPP (Pharmacist) Rigoberto Noel, MD as Consulting Physician (Pulmonary Disease)  Recent office visits: 03/07/2021 - PCP (Dr Carollee Herter) Annual Exam. No medication changes. Labs checked.  12/29/2020 - Fam Med (Dr Carollee Herter) Office visit for cerumen impaction and immunization. Ears irragated. Received flu vaccine.  12/20/2020 - Fam Med (Dr Carollee Herter) Video Visit for Bronchitis. Prescribed azithromycin for 5 days. OTC mucinex as needed for cough  Recent consult visits: 04/20/2021 - Otho (Dr Veverly Fells) left shoulder pain 04/06/2021 - Ortho (dr Linden Dolin) left shoulder pain 04/04/2021 - GI (Zehr, [place]) Seen for lower abdoinal pain. Started hyddorcortisone suppository qhs for 7 nights. Recommended Benefiber 2 teaspoons in 8 ouces of liwuid  every day. Ordered CT of abdomen. 11/03/2020 - Ortho (Dr Gladstone Lighter) left should pain and low back pain 10/11/2020 - Pulm (Dr Elsworth Soho) F/U OSA. Off supplemental O2 during day. Using CPAP at night. Provided handicapped sticker.   Hospital visits: None in previous 6 months  Objective:  Lab Results  Component Value Date   CREATININE 0.84 04/04/2021   CREATININE 0.77 03/07/2021   CREATININE 0.74 09/16/2020    Lab Results  Component Value Date   HGBA1C 6.6 (H) 03/07/2021   Last diabetic Eye exam:  Lab Results  Component Value Date/Time   HMDIABEYEEXA No Retinopathy 11/05/2019 12:00 AM    Last diabetic Foot exam: No results found for: HMDIABFOOTEX      Component Value Date/Time   CHOL 218 (H) 03/07/2021 1106   CHOL 209 (H) 08/26/2018 1018   TRIG 54.0 03/07/2021 1106   HDL 137.40 03/07/2021 1106   HDL 101 08/26/2018 1018   CHOLHDL 2 03/07/2021 1106   VLDL 10.8 03/07/2021 1106   LDLCALC 70 03/07/2021 1106   LDLCALC 69 11/10/2019 1122   LDLDIRECT 95.9 10/06/2012 1248    Hepatic Function Latest Ref Rng & Units 03/07/2021 09/16/2020 08/11/2020  Total Protein 6.0 - 8.3 g/dL 6.8 6.7 7.0  Albumin 3.5 - 5.2 g/dL 4.2 4.0 4.6  AST 0 - 37 U/L _0 ALT 0 - 35 U/L _1 Alk Phosphatase 39 - 117 U/L 67 72 76  Total Bilirubin 0.2 - 1.2 mg/dL 1.0 0.8 1.3(H)  Bilirubin, Direct 0.00 - 0.40 mg/dL - - -    Lab Results  Component Value Date/Time   TSH 1.250 05/27/2017 12:24 PM   TSH 1.27 12/28/2016  12:00 AM   TSH 1.00 01/03/2015 12:00 AM   TSH 1.61 08/07/2007 09:38 AM   FREET4 1.05 05/27/2017 12:24 PM    CBC Latest Ref Rng & Units 03/07/2021 01/31/2019 01/30/2019  WBC 4.0 - 10.5 K/uL 5.3 5.3 5.6  Hemoglobin 12.0 - 15.0 g/dL 14.1 12.0 12.9  Hematocrit 36.0 - 46.0 % 42.7 36.2 38.2  Platelets 150.0 - 400.0 K/uL 224.0 202 175    Lab Results  Component Value Date/Time   VD25OH 39.1 01/28/2018 09:24 AM   VD25OH 45.9 05/27/2017 12:24 PM    Clinical ASCVD: Yes  The ASCVD Risk  score (Arnett DK, et al., 2019) failed to calculate for the following reasons:   The valid HDL cholesterol range is 20 to 100 mg/dL     Social History   Tobacco Use  Smoking Status Former   Packs/day: 1.00   Years: 50.00   Pack years: 50.00   Types: Cigarettes   Quit date: 11/21/2009   Years since quitting: 11.4  Smokeless Tobacco Never   BP Readings from Last 3 Encounters:  04/04/21 130/78  03/07/21 110/78  01/31/21 138/72   Pulse Readings from Last 3 Encounters:  04/04/21 66  03/07/21 65  01/31/21 64   Wt Readings from Last 3 Encounters:  04/04/21 189 lb 2 oz (85.8 kg)  03/07/21 192 lb (87.1 kg)  01/31/21 184 lb 12.8 oz (83.8 kg)    Assessment: Review of patient past medical history, allergies, medications, health status, including review of consultants reports, laboratory and other test data, was performed as part of comprehensive evaluation and provision of chronic care management services.   SDOH:  (Social Determinants of Health) assessments and interventions performed:     CCM Care Plan  No Known Allergies  Medications Reviewed Today     Reviewed by Cherre Robins, RPH-CPP (Pharmacist) on 04/21/21 at 1353  Med List Status: <None>   Medication Order Taking? Sig Documenting Provider Last Dose Status Informant  albuterol (VENTOLIN HFA) 108 (90 Base) MCG/ACT inhaler [id number] Yes Inhale 2 puffs into the lungs every 6 (six) hours as needed for wheezing or shortness of breath. Rigoberto Noel, MD Taking Active   apixaban (ELIQUIS) 5 MG TABS tablet [id number] Yes Take 1 tablet (5 mg total) by mouth 2 (two) times daily. Minus Breeding, MD Taking Active            Med Note [facility] [place] B   Fri Apr 21, 2021  1:52 PM) Patient assistance program thru 02/18/2022  blood glucose meter kit and supplies KIT [id number] Yes Dispense based on patient and insurance preference. Use up to four times daily as directed. (FOR ICD-9 250.00, 250.01). Ann Held, DO Taking Active  Self  CALCIUM-VITAMIN D PO [id number] Yes Take 600 mg by mouth daily. [provider] Taking Active Self  cholecalciferol (VITAMIN D3) 10 MCG (400 UNIT) TABS tablet [id number] Yes Take 400 Units by mouth daily. [provider] Taking Active Self  Coenzyme Q10 100 MG TABS [id number] Yes Take 100 mg by mouth daily. [provider] Taking Active Self  Flaxseed, Linseed, (FLAXSEED OIL PO) [id number] Yes Take 1,000 mg by mouth daily.  [provider] Taking Active Self  furosemide (LASIX) 40 MG tablet [id number] Yes TAKE 1 TABLET DAILY Minus Breeding, MD Taking Active            Med Note [facility], Jenene Slicker Feb 27, 2021  3:58 PM) Dewaine Conger as needed  hydrocortisone (ANUSOL-HC) 25 MG suppository  [id number] Yes Place 1 suppository (25 mg total) rectally every 12 (twelve) hours. Zehr, Laban Emperor, PA-C Taking Active   Insulin Pen Needle (NOVOFINE PLUS PEN NEEDLE) 32G X 4 MM MISC [id number] Yes As directed Ann Held, DO Taking Active   Multiple Vitamin (MULTIVITAMIN) capsule [id number] Yes Take 1 capsule by mouth daily. [provider] Taking Active     Discontinued 05/07/11 1124 (Discontinued by provider) Omega-3 Fatty Acids (FISH OIL PO) [id number] Yes Take 1,000 mg by mouth daily. [provider] Taking Active Self  POTASSIUM CHLORIDE PO [id number] Yes Take 1 tablet by mouth as needed (muscle cramps).  [provider] Taking Active Self           Med Note Hali Marry, SARA E   Tue Jan 27, 2019  4:18 PM) Pt states she takes when she takes furosemide to mitigate cramping  PRALUENT 75 MG/ML SOAJ [id number] Yes INJECT 1 PEN INTO THE SKIN EVERY 14 DAYS Minus Breeding, MD Taking Active            Med Note Antony Contras, Sandre Kitty   Fri Apr 21, 2021  1:53 PM) [place] 01/2022    Discontinued 05/07/11 1124 (Change in therapy) rosuvastatin (CRESTOR) 10 MG tablet [id number] Yes TAKE 1 TABLET DAILY Minus Breeding, MD Taking Active   Semaglutide,0.25 or  0.5MG/DOS, (OZEMPIC, 0.25 OR 0.5 MG/DOSE,) 2 MG/1.5ML SOPN [id number] Yes Inject 0.5 mg into the skin once a week. Ann Held, DO Taking Active            Med Note [facility]   Tue Apr 04, 2021 12:20 PM) Approved thru Novo patient assistance program thru 06/03/2021  TRUE METRIX BLOOD GLUCOSE TEST test strip [id number] Yes TEST  UP  TO FOUR TIMES DAILY AS DIRECTED Ann Held, DO Taking Active Self  TRUEPLUS LANCETS 33G [place] [id number] Yes TEST  UP TO FOUR TIMES DAILY AS DIRECTED Ann Held, DO Taking Active Self            Patient Active Problem List   Diagnosis Date Noted   Lower abdominal pain 04/04/2021   Rectal bleeding 04/04/2021   Change in bowel habits 04/04/2021   Internal hemorrhoids 04/04/2021   Preventative health care 03/07/2021   Sacroiliitis ([place]) 03/07/2021   Morbid (severe) obesity due to excess calories ([place]) 03/07/2021   Bronchitis 12/20/2020   Other pulmonary embolism with acute cor pulmonale, unspecified chronicity ([place])    Educated about COVID-19 virus infection 01/26/2019   Hyperlipidemia associated with type 2 diabetes mellitus ([place]) 04/24/2018   Right ankle swelling 05/13/2017   Diabetes mellitus, type II ([place]) 04/08/2017   Essential hypertension 04/08/2017   Atherosclerosis of aorta ([place]) 04/08/2017   Abnormal auditory perception of both ears 10/01/2016   Bilateral impacted cerumen 10/01/2016   OSA (obstructive sleep apnea) 12/16/2015   DOE (dyspnea on exertion) 06/12/2013   Obesity (BMI 30-39.9) 04/24/2013   Chest pain, unspecified 01/22/2011   Abnormal stress test 01/22/2011   Frequent urination 01/22/2011   Chronic diastolic heart failure ([place]) 05/15/2009   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   RASH-NONVESICULAR 04/20/2009   CERVICAL STRAIN, WITH RADICULOPATHY 03/31/2009   CAROTID ARTERY DISEASE 01/12/2009   CERVICAL STRAIN 01/11/2009   LEG EDEMA, RIGHT 07/27/2008   HX, URINARY INFECTION 09/19/2006    Hyperlipidemia LDL goal <70 08/20/2006   PREGNANCY, ECTOPIC NEC W/INTRAUTERINE PRG 08/20/2006   FREQUENCY, URINARY 08/20/2006   Other specified abnormal findings of blood chemistry 08/20/2006  Personal history of venous thrombosis and embolism 08/20/2006    Immunization History  Administered Date(s) Administered   Fluad Quad(high Dose 65+) 12/04/2019, 12/29/2020   Influenza, High Dose Seasonal PF 12/22/2018   PFIZER Comirnaty(Gray Top)Covid-19 Tri-Sucrose Vaccine 06/08/2020   PFIZER(Purple Top)SARS-COV-2 Vaccination 03/06/2019, 03/27/2019, 11/10/2019   Pfizer Covid-19 Vaccine Bivalent Booster 34yr & up 12/06/2020   Pneumococcal Conjugate-13 04/24/2013   Pneumococcal Polysaccharide-23 08/07/2007, 12/04/2019   Td 03/03/2003   Tdap 11/06/2018   Zoster Recombinat (Shingrix) 11/06/2018, 05/11/2019   Zoster, Live 08/14/2007, 11/13/2007    Conditions to be addressed/monitored: CHF, CAD, HTN, HLD, DMII, and history of pulmonary embolism  Care Plan : General Pharmacy (Adult)  Updates made by ECherre Robins RPH-CPP since 04/21/2021 12:00 AM     Problem: type 2 DM; Hyperlipidemia; CHF: h/o PE; obesity; OSA      Long-Range Goal: Long Range pharmacy goals for chronic conditions and medication management   Start Date: 08/17/2020  Recent Progress: On track  Priority: High  Note:   Current Barriers:  Unable to independently afford treatment regimen   Pharmacist Clinical Goal(s):  Over the next 180 days, patient will verbalize ability to afford treatment regimen maintain control of DM, BP and cholesterol as evidenced by maintaining goals listed below  adhere to prescribed medication regimen as evidenced by fill hisory  through collaboration with PharmD and provider.   Interventions: 1:1 collaboration with LCarollee Herter YAlferd Apa DO regarding development and update of comprehensive plan of care as evidenced by provider attestation and co-signature Inter-disciplinary care team  collaboration (see longitudinal plan of care) Comprehensive medication review performed; medication list updated in electronic medical record   Hypertension Screening Controlled;  BP goal <130/80 Does not currently check blood pressure at home Current regimen:  Diet and exercise management   Interventions: Discussed BP goal  Hyperlipidemia Controlled; LDL goal < 70 Current regimen:  Rosuvastatin 136mdaily Praluent 7534mvery 14 days  Flaxseed Oil 1000m93mily CoEnzyme Q10 daily  Patient receives assistance with Praluent cost from [employer] she reports that she has received notification that she was approved for 2023.  Interventions: Discussed LDL goal Maintain cholesterol medication regimen.  Notify clinical pharmacist if any future issues regarding cost of Praulent or [employer]  Diabetes Controlled;  A1c goal <7% Current regimen:  Ozempic 0.5mg 89mcutaneously once per week Tolerating Ozempic without nausea; She did have some abdominal pain and constipation recently and was seen by GI. She started Benefiber and increased dietary fiber and abdominal pain has resolved and stools more consistent.  Received OzempPilgrim's Prideent assistance in 2022. Completed application for patient assistance program. Patient has [employer] Medicare part B and A government employee plan for prescriptions but verified that cost of Ozempic would be $200 on he GEHA plan.  Verified patient was approved for patient assistance program for Ozempic thru 06/03/2021. Novo is still experiencing shipping delays which they think will be resolved by May 19, 2021. Patient received sample of OZempic 04/21/2019 - 4 doses. Exercise: doing either water aerobics or treadmill 5 days per week Diet: limiting serving sizes (easier with Ozempic) and sugar intake. She has also decreased intake of red meat.  Interventions: Discussed A1c  goal Reviewed home blood glucose goals  Fasting blood  glucose goal (before meals) = 80 to 130 Blood glucose goal after a meal = less than 180 : Restart checking blood sugar 2 ot 3 times per week, document, and provide at future appointments Discussed possibly increasing Ozempic  to 49m in future for more weight loss effects if able to tolerate.   Osteoporosis Screening / Bone Health Goal: reduce risk of fracture due to osteopenia/osteoporosis Current regimen:  Calcium/Vitamin 608m800 units twice daily  Vitamin D 40022mwice daily Recent serum calcium elevated at 10.9 Interventions: Recommend lowering dose of calcium due to recent elevated serum calcium Decrease calcium supplement to take just 1 tablet daily  Continue current vitamin D supplementation   Medication management [facility] Patient is taking Eliquis for history of PE. She has been receiving from patient assistance recently. She is working with cardiology office on this Interventions Comprehensive medication review performed. Continue current medication management strategy  Patient Goals/Self-Care Activities Over the next 180 days, patient will:  take medications as prescribed,  check glucose daily , document, and provide at future appointment Reviewed home blood glucose goals  Fasting blood glucose goal (before meals) = 80 to 130 Blood glucose goal after a meal = less than 180 : Restart checking blood sugar 2 ot 3 times per week, document, and provide at future appointments Engage in dietary modifications by limiting carbohydrate serving sizes;  Continue to get at least 150 minutes of exercise weekly  Follow Up Plan: Telephone follow up appointment with care management team member scheduled for:  3 weeks to check on 2023 patient assistance program         Medication Assistance:  Received Eliquis and Ozempic through patient assistance program in 2022.  Praluent through [employer] Cor[employer]roved for patient assistance  program thru 06/03/2021 - but due to [employer] Dod[employer], first shipmen tin 2023 has not been processed yet. Expect to be processed/ shipped around 04/11/2021.  Eliquis [id number] initially denied. Patient is contacting cardiology office since they sent in for her.  Approved per patient for [employer]'s Corporationr [employer] Sciences Corporationr 2023.   [facility] [place] [facility] [address] [place] [id number]: [phone]: [phone]/pharmacy #[id number] [place] -[id number]: [phone]: [phone]),[place] -[place]) [place] [id number]: [phone]: 336-[place] -[address] [address] [place]: [phone]: 336-[place] -[place] [address] [place]: [phone]: [phone] Up:  Patient agrees to Care Plan and Follow-up.  Plan: Telephone follow up appointment with care management team member scheduled for:  3 weeks  TammCherre RobinsarmD Clinical Pharmacist [place]-[phone]

## 2021-04-21 NOTE — Patient Instructions (Signed)
Mrs. Jurczyk ?It was a pleasure speaking with you today.  ?I have attached a summary of our visit today and information about your health goals.  ? ?Patient Goals/Self-Care Activities ?Over the next 180 days, patient will:  ?take medications as prescribed,  ?check glucose daily , document, and provide at future appointment ?Reviewed home blood glucose goals  ?Fasting blood glucose goal (before meals) = 80 to 130 ?Blood glucose goal after a meal = less than 180 : ?Restart checking blood sugar 2 ot 3 times per week, document, and provide at future appointments ?Engage in dietary modifications by limiting carbohydrate serving sizes;  ?Continue to get at least 150 minutes of exercise weekly ? ?If you have any questions or concerns, please feel free to contact me either at the phone number below or with a MyChart message.  ? ?Keep up the good work! ? ?Cherre Robins, PharmD ?Clinical Pharmacist ?[facility] ?[place] ?[phone] (direct line)  ?[phone] (main office number) ? ? ?Patient verbalizes understanding of instructions and care plan provided today and agrees to view in [place]. Active MyChart status confirmed with patient.    ?

## 2021-04-23 NOTE — Progress Notes (Signed)
?  ?Cardiology Office Note ? ? ?Date:  04/24/2021  ? ?ID:  Sierra Ford, DOB 1940/04/19, MRN [id number] ? ?PCP:  Ann Held, DO  ?Cardiologist:   Minus Breeding, MD ? ?Chief Complaint  ?Patient presents with  ? Shortness of Breath  ?  ?History of Present Illness: ?Sierra Ford is a 81 y.o. female who presents for follow up of diastolic dysfunction and abnormal echo. Echo 04/16/17  demonstrated an EF of 65% I saw her last year and she returns for follow up.   She had a pulmonary embolism.   She had RV strain which eventually recovered.   ? ?Since I last saw her has been okay.  She is in chair yoga.  Still gets some breathlessness with activity but she does not describe any new shortness of breath, PND or orthopnea.  She had no new palpitations, presyncope or syncope.  She has no chest pressure, neck or arm discomfort. ? ? ?Past Medical History:  ?Diagnosis Date  ? Anxiety   ? Back pain   ? Chest pain   ? CLite with apical ischemia in 2006 - normal coronary arteries by [facility] in 12/2004;  Myoview 11/12:  Low risk stress nuclear study with a small, partially reversible apical defect most likely related to apical thinning; cannot R/O very mild apical ischemia.  EF: 75%   ? Decreased hearing   ? Depression   ? Diabetes ([place])   ? DVT (deep venous thrombosis) ([place]) 2006  ? hx of  ? Ectopic pregnancy with intrauterine pregnancy   ? Hiatal hernia   ? Hyperlipidemia   ? PE (pulmonary embolism) 2006  ? hx of  ? Sleep apnea   ? CPAP   ? ? ?Past Surgical History:  ?Procedure Laterality Date  ? ABDOMINAL EXPLORATION SURGERY    ? BUNIONECTOMY    ? right  ? COLONOSCOPY  2009  ? ECTOPIC PREGNANCY SURGERY    ? FOOT SURGERY    ? TUBAL LIGATION    ? ? ? ?Current Outpatient Medications  ?Medication Sig Dispense Refill  ? albuterol (VENTOLIN HFA) 108 (90 Base) MCG/ACT inhaler Inhale 2 puffs into the lungs every 6 (six) hours as needed for wheezing or shortness of breath. 8 g 2  ? blood glucose meter kit and supplies KIT  Dispense based on patient and insurance preference. Use up to four times daily as directed. (FOR ICD-9 250.00, 250.01). 1 each 0  ? CALCIUM-VITAMIN D PO Take 600 mg by mouth daily.    ? cholecalciferol (VITAMIN D3) 10 MCG (400 UNIT) TABS tablet Take 400 Units by mouth daily.    ? Coenzyme Q10 100 MG TABS Take 100 mg by mouth daily.    ? Flaxseed, Linseed, (FLAXSEED OIL PO) Take 1,000 mg by mouth daily.     ? furosemide (LASIX) 40 MG tablet TAKE 1 TABLET DAILY 90 tablet 2  ? hydrocortisone (ANUSOL-HC) 25 MG suppository Place 1 suppository (25 mg total) rectally every 12 (twelve) hours. 12 suppository 1  ? Insulin Pen Needle (NOVOFINE PLUS PEN NEEDLE) 32G X 4 MM MISC As directed 100 each 1  ? Multiple Vitamin (MULTIVITAMIN) capsule Take 1 capsule by mouth daily.    ? Omega-3 Fatty Acids (FISH OIL PO) Take 1,000 mg by mouth daily.    ? POTASSIUM CHLORIDE PO Take 1 tablet by mouth as needed (muscle cramps).     ? PRALUENT 75 MG/ML SOAJ INJECT 1 PEN INTO THE SKIN EVERY 14 DAYS  2 mL 11  ? rosuvastatin (CRESTOR) 10 MG tablet TAKE 1 TABLET DAILY 90 tablet 2  ? Semaglutide,0.25 or 0.5MG/DOS, (OZEMPIC, 0.25 OR 0.5 MG/DOSE,) 2 MG/1.5ML SOPN Inject 0.5 mg into the skin once a week. 4.5 mL 1  ? TRUE METRIX BLOOD GLUCOSE TEST test strip TEST  UP  TO FOUR TIMES DAILY AS DIRECTED 200 each 12  ? TRUEPLUS LANCETS 33G MISC TEST  UP TO FOUR TIMES DAILY AS DIRECTED 200 each 12  ? apixaban (ELIQUIS) 2.5 MG TABS tablet Take 1 tablet (2.5 mg total) by mouth 2 (two) times daily. 180 tablet 1  ? ?No current facility-administered medications for this visit.  ? ? ?Allergies:   Patient has no known allergies.  ? ? ? ?ROS:  Please see the history of present illness.   Otherwise, review of systems are positive for none.   All other systems are reviewed and negative.  ? ? ?PHYSICAL EXAM: ?VS:  BP 115/62   Pulse 66   Ht '5\' 2"'  (1.575 m)   Wt 192 lb (87.1 kg)   SpO2 98%   BMI 35.12 kg/m?  , BMI Body mass index is 35.12 kg/m?. ?GENERAL:  Well  appearing ?NECK:  No jugular venous distention, waveform within normal limits, carotid upstroke brisk and symmetric, no bruits, no thyromegaly ?LUNGS:  Clear to auscultation bilaterally ?CHEST:  Unremarkable ?HEART:  PMI not displaced or sustained,S1 and S2 within normal limits, no S3, no S4, no clicks, no rubs, no murmurs ?ABD:  Flat, positive bowel sounds normal in frequency in pitch, no bruits, no rebound, no guarding, no midline pulsatile mass, no hepatomegaly, no splenomegaly ?EXT:  2 plus pulses throughout, no edema, no cyanosis no clubbing ? ? ?EKG:  EKG is  ordered today. ?Sinus rhythm, rate 66, left axis deviation, left anterior fascicular block, no acute ST-T wave changes. ? ?Recent Labs: ?03/07/2021: ALT 21; Hemoglobin 14.1; Platelets 224.0 ?04/04/2021: BUN 17; Creatinine, Ser 0.84; Potassium 4.3; Sodium 141  ? ? ?Lipid Panel ?   ?Component Value Date/Time  ? CHOL 218 (H) 03/07/2021 1106  ? CHOL 209 (H) 08/26/2018 1018  ? TRIG 54.0 03/07/2021 1106  ? HDL 137.40 03/07/2021 1106  ? HDL 101 08/26/2018 1018  ? CHOLHDL 2 03/07/2021 1106  ? VLDL 10.8 03/07/2021 1106  ? [place] 70 03/07/2021 1106  ? [place] 69 11/10/2019 1122  ? LDLDIRECT 95.9 10/06/2012 1248  ? ?  ?Lab Results  ?Component Value Date  ? HGBA1C 6.6 (H) 03/07/2021  ? ? ? ?Wt Readings from Last 3 Encounters:  ?04/24/21 192 lb (87.1 kg)  ?04/04/21 189 lb 2 oz (85.8 kg)  ?03/07/21 192 lb (87.1 kg)  ?  ? ? ?Other studies Reviewed: ?Additional studies/ records that were reviewed today include:   Labs ?Review of the above records demonstrates: See elsewhere ? ? ?ASSESSMENT AND PLAN: ? ?PULMONARY EMBOLISM:    I had to reduce her maintenance dose Eliquis 2.5 mg twice daily.  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 ? ?DIASTOLIC HF:    She seems to be euvolemic.  No change in therapy. ? ?DM:   A1c was 6.6.  No change in therapy.  ? ?SLEEP APNEA:  She wears CPAP.    ? ?DYSLIPIDEMIA:   LDL is 70 with an HDL of 137!Marland Kitchen  No change in therapy.   ? ?DYSPNEA: I do note that she had pulmonary function tests ordered but these were not scheduled so I will go ahead and facilitate  this.  She has follow-up with her pulmonologist. ? ?Current medicines are reviewed at length with the patient today.  The patient does not have concerns regarding medicines. ? ?The following changes have been made: As above ? ?Labs/ tests ordered today include:    ? ?Orders Placed This Encounter  ?Procedures  ? EKG 12-Lead  ? ? ? ?Disposition:   FU with me in 12 months ? ? ?Signed, ?Minus Breeding, MD  ?04/24/2021 9:38 AM    ?[place] ?

## 2021-04-24 ENCOUNTER — Other Ambulatory Visit: Payer: Self-pay

## 2021-04-24 ENCOUNTER — Encounter: Payer: Self-pay | Admitting: Cardiology

## 2021-04-24 ENCOUNTER — Ambulatory Visit (INDEPENDENT_AMBULATORY_CARE_PROVIDER_SITE_OTHER): Payer: Medicare PPO | Admitting: Cardiology

## 2021-04-24 VITALS — BP 115/62 | HR 66 | Ht 62.0 in | Wt 192.0 lb

## 2021-04-24 DIAGNOSIS — G4730 Sleep apnea, unspecified: Secondary | ICD-10-CM | POA: Diagnosis not present

## 2021-04-24 DIAGNOSIS — I2699 Other pulmonary embolism without acute cor pulmonale: Secondary | ICD-10-CM | POA: Diagnosis not present

## 2021-04-24 DIAGNOSIS — E785 Hyperlipidemia, unspecified: Secondary | ICD-10-CM | POA: Diagnosis not present

## 2021-04-24 DIAGNOSIS — I5032 Chronic diastolic (congestive) heart failure: Secondary | ICD-10-CM

## 2021-04-24 MED ORDER — APIXABAN 2.5 MG PO TABS: 2.5000 mg | ORAL_TABLET | Freq: Two times a day (BID) | ORAL | 1 refills | Status: DC

## 2021-04-24 NOTE — Patient Instructions (Signed)
Medication Instructions:  ?DECREASE YOUR ELIQUIS TO 2.5 MG TWICE A DAY  ? ?*If you need a refill on your cardiac medications before your next appointment, please call your pharmacy* ? ?Lab Work: ?NONE  ? ?Testing/Procedures: ?NONE ? ?Follow-Up: ?At [facility], you and your health needs are our priority.  As part of our continuing mission to provide you with exceptional heart care, we have created designated Provider Care Teams.  These Care Teams include your primary Cardiologist (physician) and Advanced Practice Providers (APPs -  Physician Assistants and Nurse Practitioners) who all work together to provide you with the care you need, when you need it. ? ?We recommend signing up for the patient portal called "MyChart".  Sign up information is provided on this After Visit Summary.  MyChart is used to connect with patients for Virtual Visits (Telemedicine).  Patients are able to view lab/test results, encounter notes, upcoming appointments, etc.  Non-urgent messages can be sent to your provider as well.   ?To learn more about what you can do with MyChart, go to [email or web address].   ? ?Your next appointment:   ?12 month(s) ? ?The format for your next appointment:   ?In Person ? ?Provider:   ?Minus Breeding, MD   ? ?

## 2021-04-25 ENCOUNTER — Telehealth: Payer: Self-pay

## 2021-04-25 NOTE — Telephone Encounter (Signed)
-----   Message from Rigoberto Noel, MD sent at 04/24/2021  1:47 PM EST ----- ?Please schedule PFTs ?----- Message ----- ?From: Earvin Hansen, LPN ?Sent: 04/24/2021  10:29 AM EST ?To: Rigoberto Noel, MD, Fran Lowes, CMA ? ?Good morning,  ? ?This patient was in seeing Dr Percival Spanish today and noticed that she never had her PFT's Could someone please reach out to her about getting them scheduled  ? ?Thanks ? ?Rip Harbour  ? ? ?

## 2021-04-26 NOTE — Telephone Encounter (Signed)
Pt called. Pt advised that her Ozempic was delivered. Meds placed in frig by [name]. Pt will pick it up Thursday or Friday ?

## 2021-04-26 NOTE — Telephone Encounter (Signed)
Patient is scheduled in next available PFT slot, 05/23/2021 at 4pm. PFT instructions mailed out to patient- nothing further needed. ?

## 2021-05-12 ENCOUNTER — Ambulatory Visit: Payer: Medicare PPO | Admitting: Pharmacist

## 2021-05-12 DIAGNOSIS — E1165 Type 2 diabetes mellitus with hyperglycemia: Secondary | ICD-10-CM

## 2021-05-12 DIAGNOSIS — E1169 Type 2 diabetes mellitus with other specified complication: Secondary | ICD-10-CM

## 2021-05-12 DIAGNOSIS — E669 Obesity, unspecified: Secondary | ICD-10-CM

## 2021-05-12 NOTE — Chronic Care Management (AMB) (Signed)
? ? ?Chronic Care Management ?Pharmacy Note ? ?05/12/2021 ?Name:  Sierra Ford MRN:  [id number] DOB:  Oct 01, 1940 ? ?Summary: ?Provided patient assistance program application for Ozemipc. Patient's current enrollment will end 06/03/2021 and will need to reapply prior to next shipment.  ? ?Subjective: ?Sierra Ford is an 81 y.o. year old female who is a primary patient of Ann Held, DO.  The CCM team was consulted for assistance with disease management and care coordination needs.   ? ?Collaboration with  [employer] patient assistance program   for  Chronic Care Management follow up  in response to provider referral for pharmacy case management and/or care coordination services.  ? ?Consent to Services:  ?The patient was given information about Chronic Care Management services, agreed to services, and gave verbal consent prior to initiation of services.  Please see initial visit note for detailed documentation.  ? ?Patient Care Team: ?Carollee Herter, Alferd Apa, DO as PCP - General ?Minus Breeding, MD as PCP - Cardiology (Cardiology) ?Maisie Fus, MD as Consulting Physician (Obstetrics and Gynecology) ?Cherre Robins, RPH-CPP (Pharmacist) ?Rigoberto Noel, MD as Consulting Physician (Pulmonary Disease) ? ?Recent office visits: ?03/07/2021 - PCP (Dr Carollee Herter) Annual Exam. No medication changes. Labs checked.  ?12/29/2020 - Fam Med (Dr Carollee Herter) Office visit for cerumen impaction and immunization. Ears irragated. Received flu vaccine.  ?12/20/2020 - Fam Med (Dr Carollee Herter) Video Visit for Bronchitis. Prescribed azithromycin for 5 days. OTC mucinex as needed for cough ? ?Recent consult visits: ?04/24/2021 - Cardio (Dr Jenkins Rouge) F/U PE unprovoked. Lowered dose of Eliquis to 2.27m twice a day. Scheduled PFTs. Recommended F/U with pulmonology. F/U 12 months with cardio.  ?04/20/2021 - Otho (Dr NVeverly Fells left shoulder pain ?04/06/2021 - Ortho (dr GLinden Dolin left shoulder pain ?04/04/2021 - GI (Zehr, [place]  Seen for lower abdoinal pain. Started hyddorcortisone suppository qhs for 7 nights. Recommended Benefiber 2 teaspoons in 8 ouces of liwuid every day. Ordered CT of abdomen.  ? ?Hospital visits: ?None in previous 6 months ? ?Objective: ? ?Lab Results  ?Component Value Date  ? CREATININE 0.84 04/04/2021  ? CREATININE 0.77 03/07/2021  ? CREATININE 0.74 09/16/2020  ? ? ?Lab Results  ?Component Value Date  ? HGBA1C 6.6 (H) 03/07/2021  ? ?Last diabetic Eye exam:  ?Lab Results  ?Component Value Date/Time  ? HMDIABEYEEXA No Retinopathy 11/05/2019 12:00 AM  ?  ?Last diabetic Foot exam: No results found for: HMDIABFOOTEX  ? ?   ?Component Value Date/Time  ? CHOL 218 (H) 03/07/2021 1106  ? CHOL 209 (H) 08/26/2018 1018  ? TRIG 54.0 03/07/2021 1106  ? HDL 137.40 03/07/2021 1106  ? HDL 101 08/26/2018 1018  ? CHOLHDL 2 03/07/2021 1106  ? VLDL 10.8 03/07/2021 1106  ? [place]70 03/07/2021 1106  ? [place]69 11/10/2019 1122  ? LDLDIRECT 95.9 10/06/2012 1248  ? ? ? ?  Latest Ref Rng & Units 03/07/2021  ? 11:06 AM 09/16/2020  ? 11:58 AM 08/11/2020  ? 11:14 AM  ?Hepatic Function  ?Total Protein 6.0 - 8.3 g/dL 6.8   6.7   7.0    ?Albumin 3.5 - 5.2 g/dL 4.2   4.0   4.6    ?AST 0 - 37 U/L '19   16   20    ' ?ALT 0 - 35 U/L '21   16   19    ' ?Alk Phosphatase 39 - 117 U/L 67   72   76    ?Total Bilirubin  0.2 - 1.2 mg/dL 1.0   0.8   1.3    ? ? ?Lab Results  ?Component Value Date/Time  ? TSH 1.250 05/27/2017 12:24 PM  ? TSH 1.27 12/28/2016 12:00 AM  ? TSH 1.00 01/03/2015 12:00 AM  ? TSH 1.61 08/07/2007 09:38 AM  ? FREET4 1.05 05/27/2017 12:24 PM  ? ? ? ?  Latest Ref Rng & Units 03/07/2021  ? 11:06 AM 01/31/2019  ?  2:30 AM 01/30/2019  ?  3:02 AM  ?CBC  ?WBC 4.0 - 10.5 K/uL 5.3   5.3   5.6    ?Hemoglobin 12.0 - 15.0 g/dL 14.1   12.0   12.9    ?Hematocrit 36.0 - 46.0 % 42.7   36.2   38.2    ?Platelets 150.0 - 400.0 K/uL 224.0   202   175    ? ? ?Lab Results  ?Component Value Date/Time  ? VD25OH 39.1 01/28/2018 09:24 AM  ? VD25OH 45.9 05/27/2017 12:24 PM   ? ? ?Clinical ASCVD: Yes  ?The ASCVD Risk score (Arnett DK, et al., 2019) failed to calculate for the following reasons: ?  The valid HDL cholesterol range is 20 to 100 mg/dL   ? ? ?Social History  ? ?Tobacco Use  ?Smoking Status Former  ? Packs/day: 1.00  ? Years: 50.00  ? Pack years: 50.00  ? Types: Cigarettes  ? Quit date: 11/21/2009  ? Years since quitting: 11.4  ?Smokeless Tobacco Never  ? ?BP Readings from Last 3 Encounters:  ?04/24/21 115/62  ?04/04/21 130/78  ?03/07/21 110/78  ? ?Pulse Readings from Last 3 Encounters:  ?04/24/21 66  ?04/04/21 66  ?03/07/21 65  ? ?Wt Readings from Last 3 Encounters:  ?04/24/21 192 lb (87.1 kg)  ?04/04/21 189 lb 2 oz (85.8 kg)  ?03/07/21 192 lb (87.1 kg)  ? ? ?Assessment: Review of patient past medical history, allergies, medications, health status, including review of consultants reports, laboratory and other test data, was performed as part of comprehensive evaluation and provision of chronic care management services.  ? ?SDOH:  (Social Determinants of Health) assessments and interventions performed:  ? ? ? ?CCM Care Plan ? ?No Known Allergies ? ?Medications Reviewed Today   ? ? Reviewed by Cherre Robins, RPH-CPP (Pharmacist) on 05/12/21 at [place] List Status: <None>  ? ?Medication Order Taking? Sig Documenting Provider Last Dose Status Informant  ?albuterol (VENTOLIN HFA) 108 (90 Base) MCG/ACT inhaler [id number] No Inhale 2 puffs into the lungs every 6 (six) hours as needed for wheezing or shortness of breath. Rigoberto Noel, MD Taking Active   ?apixaban (ELIQUIS) 2.5 MG TABS tablet [id number]  Take 1 tablet (2.5 mg total) by mouth 2 (two) times daily. Minus Breeding, MD  Active   ?blood glucose meter kit and supplies KIT [id number] No Dispense based on patient and insurance preference. Use up to four times daily as directed. (FOR ICD-9 250.00, 250.01). Ann Held, DO Taking Active Self  ?CALCIUM-VITAMIN D PO [id number] No Take 600 mg by mouth daily. [provider] Taking Active Self  ?cholecalciferol (VITAMIN D3) 10 MCG (400 UNIT) TABS tablet [id number] No Take 400 Units by mouth daily. [provider] Taking Active Self  ?Coenzyme Q10 100 MG TABS [id number] No Take 100 mg by mouth daily. [provider] Taking Active Self  ?Flaxseed, Linseed, (FLAXSEED OIL PO) [id number] No Take 1,000 mg by mouth daily.  [provider] Taking Active Self  ?furosemide (LASIX) 40 MG tablet  [id number] No TAKE 1 TABLET DAILY Minus Breeding, MD Taking Active   ?         ?Med Note Barbaraann Boys Feb 27, 2021  3:58 PM) Dewaine Conger as needed  ?hydrocortisone (ANUSOL-HC) 25 MG suppository [id number] No Place 1 suppository (25 mg total) rectally every 12 (twelve) hours. Zehr, Laban Emperor, PA-C Taking Active   ?Insulin Pen Needle (NOVOFINE PLUS PEN NEEDLE) 32G X 4 MM MISC [id number] No As directed Ann Held, DO Taking Active   ?Multiple Vitamin (MULTIVITAMIN) capsule [id number] No Take 1 capsule by mouth daily. [provider] Taking Active   ?Discontinued 05/07/11 1124 (Discontinued by provider) Omega-3 Fatty Acids (FISH OIL PO) [id number] No Take 1,000 mg by mouth daily. [provider] Taking Active Self  ?POTASSIUM CHLORIDE PO [id number] No Take 1 tablet by mouth as needed (muscle cramps).  [provider] Taking Active Self  ?         ?Med Note Hali Marry, [place]   Tue Jan 27, 2019  4:18 PM) Pt states she takes when she takes furosemide to mitigate cramping  ?PRALUENT 75 MG/ML SOAJ [id number] No INJECT 1 PEN INTO THE SKIN EVERY 14 DAYS Minus Breeding, MD Taking Active   ?         ?Med Note Unk Lightning   Fri Apr 21, 2021  1:53 PM) [place] 01/2022  ?Discontinued 05/07/11 1124 (Change in therapy) rosuvastatin (CRESTOR) 10 MG tablet [id number] No TAKE 1 TABLET DAILY Minus Breeding, MD Taking Active   ?Semaglutide,0.25 or 0.5MG/DOS, (OZEMPIC, 0.25 OR 0.5 MG/DOSE,) 2 MG/1.5ML SOPN [id number] No Inject 0.5 mg into the  skin once a week. Ann Held, DO Taking Active   ?         ?Med Note Unk Lightning   Tue Apr 04, 2021 12:20 PM) Approved thru [employer] patient assistance program thru 06/03/2021  ?TRUE METRIX BLOOD

## 2021-05-12 NOTE — Patient Instructions (Signed)
Mrs. Buerger ?It was a pleasure speaking with you  ?Below is a summary of your health goals and care plan ? ?Patient Goals/Self-Care Activities ?take medications as prescribed,  ?check glucose daily , document, and provide at future appointment ?Reviewed home blood glucose goals  ?Fasting blood glucose goal (before meals) = 80 to 130 ?Blood glucose goal after a meal = less than 180 : ?Restart checking blood sugar 2 ot 3 times per week, document, and provide at future appointments ?Engage in dietary modifications by limiting carbohydrate serving sizes;  ?Continue to get at least 150 minutes of exercise weekly ?Complete and sign 8416 application for [employer] patient assistance program for [employer]. Return to [employer] at [facility].  ? ? ?If you have any questions or concerns, please feel free to contact me either at the phone number below or with a MyChart message.  ? ?Keep up the good work! ? ?Cherre Robins, PharmD ?Clinical Pharmacist ?[facility] ?[place] ?[phone] (direct line)  ?[phone] (main office number) ? ? ?Patient verbalizes understanding of instructions and care plan provided today and agrees to view in [place]. Active MyChart status confirmed with patient.    ?

## 2021-05-18 ENCOUNTER — Telehealth: Payer: Self-pay | Admitting: Pharmacist

## 2021-05-18 NOTE — Telephone Encounter (Signed)
Patient called regarding patient assistance program for Ozempic. Dur to reapply to patient assistance program. Discussed with patient - she has 4 pens on hand but current approval will end 06/03/2021. Completed application. Patient to sign and return to office. She will also include needed financial information. ?

## 2021-05-19 DIAGNOSIS — E785 Hyperlipidemia, unspecified: Secondary | ICD-10-CM

## 2021-05-19 DIAGNOSIS — I10 Essential (primary) hypertension: Secondary | ICD-10-CM

## 2021-05-19 DIAGNOSIS — E1165 Type 2 diabetes mellitus with hyperglycemia: Secondary | ICD-10-CM

## 2021-05-19 DIAGNOSIS — E1169 Type 2 diabetes mellitus with other specified complication: Secondary | ICD-10-CM

## 2021-05-22 ENCOUNTER — Telehealth: Payer: Self-pay | Admitting: Cardiology

## 2021-05-22 NOTE — Telephone Encounter (Signed)
Returned call to patient-patient gets Eliquis through patient assistance program and wanted to confirm they were aware of dose change.   ? ?Called [employer] to confirm-they are aware of dose change and has this updated.   ? ?Left message for patient to make aware.  Advised to call back with any questions or concerns.  ?

## 2021-05-22 NOTE — Telephone Encounter (Signed)
New Message: ? ? ? ?Patient wanted to know if Dr Hochrein's Nurse had called and told Sierra Ford about the change in her dose for her Eliquis.? ?

## 2021-05-23 ENCOUNTER — Ambulatory Visit (INDEPENDENT_AMBULATORY_CARE_PROVIDER_SITE_OTHER): Payer: Medicare PPO | Admitting: Pulmonary Disease

## 2021-05-23 DIAGNOSIS — M25512 Pain in left shoulder: Secondary | ICD-10-CM | POA: Diagnosis not present

## 2021-05-23 DIAGNOSIS — R0609 Other forms of dyspnea: Secondary | ICD-10-CM

## 2021-05-23 DIAGNOSIS — M5459 Other low back pain: Secondary | ICD-10-CM | POA: Diagnosis not present

## 2021-05-23 DIAGNOSIS — M25511 Pain in right shoulder: Secondary | ICD-10-CM | POA: Diagnosis not present

## 2021-05-23 LAB — PULMONARY FUNCTION TEST
DL/VA % pred: 95 %
DL/VA: 3.93 ml/min/mmHg/L
DLCO cor % pred: 82 %
DLCO cor: 14.96 ml/min/mmHg
DLCO unc % pred: 82 %
DLCO unc: 14.96 ml/min/mmHg
FEF 25-75 Post: 1.88 L/sec
FEF 25-75 Pre: 1.65 L/sec
FEF2575-%Change-Post: 14 %
FEF2575-%Pred-Post: 146 %
FEF2575-%Pred-Pre: 128 %
FEV1-%Change-Post: 2 %
FEV1-%Pred-Post: 99 %
FEV1-%Pred-Pre: 97 %
FEV1-Post: 1.5 L
FEV1-Pre: 1.46 L
FEV1FVC-%Change-Post: 1 %
FEV1FVC-%Pred-Pre: 108 %
FEV6-%Change-Post: 1 %
FEV6-%Pred-Post: 97 %
FEV6-%Pred-Pre: 95 %
FEV6-Post: 1.81 L
FEV6-Pre: 1.78 L
FEV6FVC-%Change-Post: 0 %
FEV6FVC-%Pred-Post: 104 %
FEV6FVC-%Pred-Pre: 104 %
FVC-%Change-Post: 1 %
FVC-%Pred-Post: 93 %
FVC-%Pred-Pre: 91 %
FVC-Post: 1.82 L
FVC-Pre: 1.78 L
Post FEV1/FVC ratio: 83 %
Post FEV6/FVC ratio: 100 %
Pre FEV1/FVC ratio: 82 %
Pre FEV6/FVC Ratio: 100 %
RV % pred: 86 %
RV: 2.02 L
TLC % pred: 83 %
TLC: 4.07 L

## 2021-05-23 NOTE — Progress Notes (Signed)
PFT done today. 

## 2021-05-26 ENCOUNTER — Telehealth: Payer: Self-pay | Admitting: Cardiology

## 2021-05-26 NOTE — Telephone Encounter (Signed)
Returned call to patient who states she was actually calling about her Eliquis 2.'5mg'$ . Patient states that she was calling to see if she can find out when she will get her shipment of Eliquis from the company. Patient states that she currently has 11 days of Eliquis left.  ? ?[employer], confirmed that shipment of patients Eliquis 2.'5mg'$  should come from the foundation and be delivered to her home on Monday 4/10. ? ?Called and spoke with patient and made her aware that shipment should be there on Monday 4/10. Advised her to call if she has any trouble getting this prescription. Patient verbalized understanding.  ?

## 2021-05-26 NOTE — Telephone Encounter (Signed)
Pt c/o medication issue: ? ?1. Name of Medication:  ? Semaglutide,0.25 or 0.'5MG'$ /DOS, (OZEMPIC, 0.25 OR 0.5 MG/DOSE,) 2 MG/1.5ML SOPN  ? ?2. How are you currently taking this medication (dosage and times per day)? Inject 0.5 mg into the skin once a week. ? ?3. Are you having a reaction (difficulty breathing--STAT)? No ? ?4. What is your medication issue? Pt states she is wanting to know when to expect medication. Pt would like for nurse to call her. Please advise ?

## 2021-05-30 ENCOUNTER — Other Ambulatory Visit: Payer: Self-pay

## 2021-05-30 ENCOUNTER — Telehealth: Payer: Self-pay | Admitting: Cardiology

## 2021-05-30 MED ORDER — APIXABAN 2.5 MG PO TABS: 2.5000 mg | ORAL_TABLET | Freq: Two times a day (BID) | ORAL | 1 refills | Status: DC

## 2021-05-30 NOTE — Telephone Encounter (Signed)
Spoke with [place] at [facility]. Faxed apixaban 2.5 mg BID prescription to [phone]. ?

## 2021-05-30 NOTE — Telephone Encounter (Signed)
?  Pt c/o medication issue: ? ?1. Name of Medication: apixaban (ELIQUIS) 2.5 MG TABS tablet ? ?2. How are you currently taking this medication (dosage and times per day)? Take 1 tablet (2.5 mg total) by mouth 2 (two) times daily. ? ?3. Are you having a reaction (difficulty breathing--STAT)?  ? ?4. What is your medication issue? Pt said, she received her eliquis in the mail but the pt assistance program pharmacy still gave her 5 mg. She wanted to know hat to do, she also ask if she can cut it in half  ?

## 2021-05-30 NOTE — Telephone Encounter (Signed)
Informed patient that I sent her prescription for apixaban 2.5 mg BID to [facility] ([facility] today. Patient stated she has been taking 5 mg twice a day. Explained to patient that she can ask her pharmacist to cut her pills in half or I could call in a prescription to her pharmacy to carry her over until her patient assistance comes to her. She said she would ask her pharmacist to cut her pills or she will get a pill cutter. I asked if she noticed any blood in her urine or having bloody or black stools, or nosebleeds.  She said no. She mentioned she has hemorrhoids, but they don't bleed any more than usual. She uses suppositories. Her arms are bruised. I cautioned her to avoid injuries and to go to the ED if she notices any bleeding as mentioned above. She voiced understanding and also stated she will follow up with [facility] on her medication. ?

## 2021-05-30 NOTE — Progress Notes (Signed)
error 

## 2021-06-12 ENCOUNTER — Ambulatory Visit: Payer: Medicare PPO | Admitting: Pharmacist

## 2021-06-12 ENCOUNTER — Telehealth: Payer: Self-pay | Admitting: Cardiology

## 2021-06-12 DIAGNOSIS — E1165 Type 2 diabetes mellitus with hyperglycemia: Secondary | ICD-10-CM

## 2021-06-12 DIAGNOSIS — E669 Obesity, unspecified: Secondary | ICD-10-CM

## 2021-06-12 NOTE — Telephone Encounter (Signed)
Patient called because she has not received her apixaban 2.5 mg BID from [facility] [employer] [facility]. She gave me the number of the pharmacy distributor [facility] [phone]. I spoke with [place] and gave her the prescription information. Left message on patient's cell that she should get the medicine in 1-2 days. ?

## 2021-06-12 NOTE — Chronic Care Management (AMB) (Signed)
? ? ?Chronic Care Management ?Pharmacy Note ? ?06/12/2021 ?Name:  Sierra Ford MRN:  [id number] DOB:  06/02/1940 ? ?Summary: ?Reweived patient assistance program application for Ozemipc from patient. Completed provider portion and forwarded to PCP to reivew and sign.  Patient's current enrollment will end 06/03/2021 but she received a shipment in March 2023 so has enough Ozempic for now.  ? ?Subjective: ?Sierra Ford is an 81 y.o. year old female who is a primary patient of Ann Held, DO.  The CCM team was consulted for assistance with disease management and care coordination needs.   ? ?Collaboration with  PCP and [employer] patient assistance program   for  Chronic Care Management follow up  in response to provider referral for pharmacy case management and/or care coordination services.  ? ?Consent to Services:  ?The patient was given information about Chronic Care Management services, agreed to services, and gave verbal consent prior to initiation of services.  Please see initial visit note for detailed documentation.  ? ?Patient Care Team: ?Carollee Herter, Alferd Apa, DO as PCP - General ?Minus Breeding, MD as PCP - Cardiology (Cardiology) ?Maisie Fus, MD as Consulting Physician (Obstetrics and Gynecology) ?Cherre Robins, RPH-CPP (Pharmacist) ?Rigoberto Noel, MD as Consulting Physician (Pulmonary Disease) ? ?Recent office visits: ?03/07/2021 - PCP (Dr Carollee Herter) Annual Exam. No medication changes. Labs checked.  ?12/29/2020 - Fam Med (Dr Carollee Herter) Office visit for cerumen impaction and immunization. Ears irragated. Received flu vaccine.  ?12/20/2020 - Fam Med (Dr Carollee Herter) Video Visit for Bronchitis. Prescribed azithromycin for 5 days. OTC mucinex as needed for cough ? ?Recent consult visits: ?04/24/2021 - Cardio (Dr Jenkins Rouge) F/U PE unprovoked. Lowered dose of Eliquis to 2.44m twice a day. Scheduled PFTs. Recommended F/U with pulmonology. F/U 12 months with cardio.  ?04/20/2021 - Otho  (Dr NVeverly Fells left shoulder pain ?04/06/2021 - Ortho (dr GLinden Dolin left shoulder pain ?04/04/2021 - GI (Zehr, PMiller Place Seen for lower abdoinal pain. Started hyddorcortisone suppository qhs for 7 nights. Recommended Benefiber 2 teaspoons in 8 ouces of liwuid every day. Ordered CT of abdomen.  ? ?Hospital visits: ?None in previous 6 months ? ?Objective: ? ?Lab Results  ?Component Value Date  ? CREATININE 0.84 04/04/2021  ? CREATININE 0.77 03/07/2021  ? CREATININE 0.74 09/16/2020  ? ? ?Lab Results  ?Component Value Date  ? HGBA1C 6.6 (H) 03/07/2021  ? ?Last diabetic Eye exam:  ?Lab Results  ?Component Value Date/Time  ? HMDIABEYEEXA No Retinopathy 11/05/2019 12:00 AM  ?  ?Last diabetic Foot exam: No results found for: HMDIABFOOTEX  ? ?   ?Component Value Date/Time  ? CHOL 218 (H) 03/07/2021 1106  ? CHOL 209 (H) 08/26/2018 1018  ? TRIG 54.0 03/07/2021 1106  ? HDL 137.40 03/07/2021 1106  ? HDL 101 08/26/2018 1018  ? CHOLHDL 2 03/07/2021 1106  ? VLDL 10.8 03/07/2021 1106  ? L[place]70 03/07/2021 1106  ? LC[place]69 11/10/2019 1122  ? LDLDIRECT 95.9 10/06/2012 1248  ? ? ? ?  Latest Ref Rng & Units 03/07/2021  ? 11:06 AM 09/16/2020  ? 11:58 AM 08/11/2020  ? 11:14 AM  ?Hepatic Function  ?Total Protein 6.0 - 8.3 g/dL 6.8   6.7   7.0    ?Albumin 3.5 - 5.2 g/dL 4.2   4.0   4.6    ?AST 0 - 37 U/L '19   16   20    ' ?ALT 0 - 35 U/L '21   16   19    ' ?  Alk Phosphatase 39 - 117 U/L 67   72   76    ?Total Bilirubin 0.2 - 1.2 mg/dL 1.0   0.8   1.3    ? ? ?Lab Results  ?Component Value Date/Time  ? TSH 1.250 05/27/2017 12:24 PM  ? TSH 1.27 12/28/2016 12:00 AM  ? TSH 1.00 01/03/2015 12:00 AM  ? TSH 1.61 08/07/2007 09:38 AM  ? FREET4 1.05 05/27/2017 12:24 PM  ? ? ? ?  Latest Ref Rng & Units 03/07/2021  ? 11:06 AM 01/31/2019  ?  2:30 AM 01/30/2019  ?  3:02 AM  ?CBC  ?WBC 4.0 - 10.5 K/uL 5.3   5.3   5.6    ?Hemoglobin 12.0 - 15.0 g/dL 14.1   12.0   12.9    ?Hematocrit 36.0 - 46.0 % 42.7   36.2   38.2    ?Platelets 150.0 - 400.0 K/uL 224.0   202   175     ? ? ?Lab Results  ?Component Value Date/Time  ? VD25OH 39.1 01/28/2018 09:24 AM  ? VD25OH 45.9 05/27/2017 12:24 PM  ? ? ?Clinical ASCVD: Yes  ?The ASCVD Risk score (Arnett DK, et al., 2019) failed to calculate for the following reasons: ?  The valid HDL cholesterol range is 20 to 100 mg/dL   ? ? ?Social History  ? ?Tobacco Use  ?Smoking Status Former  ? Packs/day: 1.00  ? Years: 50.00  ? Pack years: 50.00  ? Types: Cigarettes  ? Quit date: 11/21/2009  ? Years since quitting: 11.5  ?Smokeless Tobacco Never  ? ?BP Readings from Last 3 Encounters:  ?04/24/21 115/62  ?04/04/21 130/78  ?03/07/21 110/78  ? ?Pulse Readings from Last 3 Encounters:  ?04/24/21 66  ?04/04/21 66  ?03/07/21 65  ? ?Wt Readings from Last 3 Encounters:  ?04/24/21 192 lb (87.1 kg)  ?04/04/21 189 lb 2 oz (85.8 kg)  ?03/07/21 192 lb (87.1 kg)  ? ? ?Assessment: Review of patient past medical history, allergies, medications, health status, including review of consultants reports, laboratory and other test data, was performed as part of comprehensive evaluation and provision of chronic care management services.  ? ?SDOH:  (Social Determinants of Health) assessments and interventions performed:  ? ? ? ?CCM Care Plan ? ?No Known Allergies ? ?Medications Reviewed Today   ? ? Reviewed by Cherre Robins, RPH-CPP (Pharmacist) on 06/12/21 at 8  Med List Status: <None>  ? ?Medication Order Taking? Sig Documenting Provider Last Dose Status Informant  ?albuterol (VENTOLIN HFA) 108 (90 Base) MCG/ACT inhaler [id number] No Inhale 2 puffs into the lungs every 6 (six) hours as needed for wheezing or shortness of breath. Rigoberto Noel, MD Taking Active   ?apixaban (ELIQUIS) 2.5 MG TABS tablet [id number]  Take 1 tablet (2.5 mg total) by mouth 2 (two) times daily. Minus Breeding, MD  Active   ?blood glucose meter kit and supplies KIT [id number] No Dispense based on patient and insurance preference. Use up to four times daily as directed. (FOR ICD-9 250.00, 250.01). Ann Held, DO Taking Active Self  ?CALCIUM-VITAMIN D PO [id number] No Take 600 mg by mouth daily. [provider] Taking Active Self  ?cholecalciferol (VITAMIN D3) 10 MCG (400 UNIT) TABS tablet [id number] No Take 400 Units by mouth daily. [provider] Taking Active Self  ?Coenzyme Q10 100 MG TABS [id number] No Take 100 mg by mouth daily. [provider] Taking Active Self  ?Flaxseed, Linseed, (FLAXSEED OIL PO) [id number] No Take  1,000 mg by mouth daily.  [provider] Taking Active Self  ?furosemide (LASIX) 40 MG tablet [id number] No TAKE 1 TABLET DAILY Minus Breeding, MD Taking Active   ?         ?Med Note Barbaraann Boys Feb 27, 2021  3:58 PM) Dewaine Conger as needed  ?hydrocortisone (ANUSOL-HC) 25 MG suppository [id number] No Place 1 suppository (25 mg total) rectally every 12 (twelve) hours. Zehr, Laban Emperor, PA-C Taking Active   ?Insulin Pen Needle (NOVOFINE PLUS PEN NEEDLE) 32G X 4 MM MISC [id number] No As directed Ann Held, DO Taking Active   ?Multiple Vitamin (MULTIVITAMIN) capsule [id number] No Take 1 capsule by mouth daily. [provider] Taking Active   ?Discontinued 05/07/11 1124 (Discontinued by provider) Omega-3 Fatty Acids (FISH OIL PO) [id number] No Take 1,000 mg by mouth daily. [provider] Taking Active Self  ?POTASSIUM CHLORIDE PO [id number] No Take 1 tablet by mouth as needed (muscle cramps).  [provider] Taking Active Self  ?         ?Med Note Hali Marry, [place]   Tue Jan 27, 2019  4:18 PM) Pt states she takes when she takes furosemide to mitigate cramping  ?PRALUENT 75 MG/ML SOAJ [id number] No INJECT 1 PEN INTO THE SKIN EVERY 14 DAYS Minus Breeding, MD Taking Active   ?         ?Med Note Unk Lightning   Fri Apr 21, 2021  1:53 PM) [place] 01/2022  ?Discontinued 05/07/11 1124 (Change in therapy) rosuvastatin (CRESTOR) 10 MG tablet [id number] No TAKE 1 TABLET DAILY Minus Breeding, MD Taking Active    ?Semaglutide,0.25 or 0.5MG/DOS, (OZEMPIC, 0.25 OR 0.5 MG/DOSE,) 2 MG/1.5ML SOPN [id number] No Inject 0.5 mg into the skin once a week. Ann Held, DO Taking Active   ?         ?Med Note (Alexanderjames Berg, TAMM

## 2021-06-12 NOTE — Telephone Encounter (Signed)
Pt c/o medication issue: ? ?1. Name of Medication: apixaban (ELIQUIS) 2.5 MG TABS tablet ? ?2. How are you currently taking this medication (dosage and times per day)? N/A ? ?3. Are you having a reaction (difficulty breathing--STAT)? No ? ?4. What is your medication issue? Patient is calling requesting to speak to Judson Roch in regards to speaking with [facility] today and them advising they do not have this new prescription. States they gave her the number [phone] for Judson Roch to call the pharmacist to discuss.  ?

## 2021-06-14 ENCOUNTER — Telehealth: Payer: Self-pay | Admitting: Pharmacist

## 2021-06-14 NOTE — Telephone Encounter (Signed)
Application for Ozempic faxed to [employer] medication assistance program. Application labeled and sent to batch scanning.  ?

## 2021-06-15 ENCOUNTER — Encounter: Payer: Self-pay | Admitting: Pharmacist

## 2021-06-16 ENCOUNTER — Telehealth: Payer: Self-pay | Admitting: Cardiology

## 2021-06-16 NOTE — Telephone Encounter (Signed)
Received same message yesterday. Pt did not answer either # on file so I responded to her in MyChart message which she has not read. Called pt and advised her of message I sent her in [place]. ?

## 2021-06-16 NOTE — Telephone Encounter (Signed)
Pt c/o medication issue: ? ?1. Name of Medication:  ? PRALUENT 75 MG/ML SOAJ  ? ?2. How are you currently taking this medication (dosage and times per day)? INJECT 1 PEN INTO THE SKIN EVERY 14 DAYS ? ?3. Are you having a reaction (difficulty breathing--STAT)? No ? ?4. What is your medication issue?  Pt states that the below pharmacy is telling her that she has a copay for medication. Pt states that she has never had to pay anything for this medication. Please advise ? ? ?[id number] - [place], [address] AT [address] [place] ?

## 2021-06-20 ENCOUNTER — Telehealth: Payer: Medicare PPO

## 2021-06-22 ENCOUNTER — Ambulatory Visit: Payer: Medicare PPO | Admitting: Family Medicine

## 2021-06-22 ENCOUNTER — Telehealth: Payer: Medicare PPO

## 2021-06-23 ENCOUNTER — Ambulatory Visit (INDEPENDENT_AMBULATORY_CARE_PROVIDER_SITE_OTHER): Payer: Medicare PPO | Admitting: Cardiology

## 2021-06-23 ENCOUNTER — Ambulatory Visit (HOSPITAL_COMMUNITY)
Admission: RE | Admit: 2021-06-23 | Discharge: 2021-06-23 | Disposition: A | Discharge status: Home / Self Care | Payer: Medicare PPO | Source: Ambulatory Visit | Attending: Cardiovascular Disease | Admitting: Cardiovascular Disease

## 2021-06-23 ENCOUNTER — Encounter: Payer: Self-pay | Admitting: Cardiology

## 2021-06-23 ENCOUNTER — Telehealth: Payer: Self-pay | Admitting: Family Medicine

## 2021-06-23 VITALS — BP 126/66 | HR 77 | Ht 62.0 in | Wt 196.0 lb

## 2021-06-23 DIAGNOSIS — E1165 Type 2 diabetes mellitus with hyperglycemia: Secondary | ICD-10-CM

## 2021-06-23 DIAGNOSIS — M79661 Pain in right lower leg: Secondary | ICD-10-CM

## 2021-06-23 NOTE — Progress Notes (Signed)
?  ?Cardiology Office Note ? ? ?Date:  06/23/2021  ? ?ID:  Sierra Ford, DOB 30-Aug-1940, MRN [id number] ? ?PCP:  Ann Held, DO  ?Cardiologist:   Minus Breeding, MD ? ?Chief Complaint  ?Patient presents with  ? Leg Pain  ?  ?History of Present Illness: ?Sierra Ford is a 81 y.o. female who presents for follow up of diastolic dysfunction and abnormal echo. Echo 04/16/17  demonstrated an EF of 65% I saw her last year and she returns for follow up.   She had a pulmonary embolism.   She had RV strain which eventually recovered.   ? ?She called to be added to the schedule today because of leg pain.  This really started last night.  It is her right leg.  She has calf swelling and soreness.  She was very worried because of potential for blood clot.  She has not had any new shortness of breath, PND or orthopnea.  She has not had any new palpitations, presyncope or syncope.  She did not have any trauma to her leg.  She has still been able to participate in senior exercise which include chair yoga and some light aerobic.  He has not been able to bring on symptoms with this. ? ? ?Past Medical History:  ?Diagnosis Date  ? Anxiety   ? Back pain   ? Chest pain   ? CLite with apical ischemia in 2006 - normal coronary arteries by [facility] in 12/2004;  Myoview 11/12:  Low risk stress nuclear study with a small, partially reversible apical defect most likely related to apical thinning; cannot R/O very mild apical ischemia.  EF: 75%   ? Decreased hearing   ? Depression   ? Diabetes ([place])   ? DVT (deep venous thrombosis) ([place]) 2006  ? hx of  ? Ectopic pregnancy with intrauterine pregnancy   ? Hiatal hernia   ? Hyperlipidemia   ? PE (pulmonary embolism) 2006  ? hx of  ? Sleep apnea   ? CPAP   ? ? ?Past Surgical History:  ?Procedure Laterality Date  ? ABDOMINAL EXPLORATION SURGERY    ? BUNIONECTOMY    ? right  ? COLONOSCOPY  2009  ? ECTOPIC PREGNANCY SURGERY    ? FOOT SURGERY    ? TUBAL LIGATION    ? ? ? ?Current Outpatient  Medications  ?Medication Sig Dispense Refill  ? albuterol (VENTOLIN HFA) 108 (90 Base) MCG/ACT inhaler Inhale 2 puffs into the lungs every 6 (six) hours as needed for wheezing or shortness of breath. 8 g 2  ? apixaban (ELIQUIS) 2.5 MG TABS tablet Take 1 tablet (2.5 mg total) by mouth 2 (two) times daily. 180 tablet 1  ? blood glucose meter kit and supplies KIT Dispense based on patient and insurance preference. Use up to four times daily as directed. (FOR ICD-9 250.00, 250.01). 1 each 0  ? CALCIUM-VITAMIN D PO Take 600 mg by mouth daily.    ? cholecalciferol (VITAMIN D3) 10 MCG (400 UNIT) TABS tablet Take 400 Units by mouth daily.    ? Coenzyme Q10 100 MG TABS Take 100 mg by mouth daily.    ? Flaxseed, Linseed, (FLAXSEED OIL PO) Take 1,000 mg by mouth daily.     ? furosemide (LASIX) 40 MG tablet TAKE 1 TABLET DAILY 90 tablet 2  ? hydrocortisone (ANUSOL-HC) 25 MG suppository Place 1 suppository (25 mg total) rectally every 12 (twelve) hours. 12 suppository 1  ? Insulin Pen  Needle (NOVOFINE PLUS PEN NEEDLE) 32G X 4 MM MISC As directed 100 each 1  ? Multiple Vitamin (MULTIVITAMIN) capsule Take 1 capsule by mouth daily.    ? Omega-3 Fatty Acids (FISH OIL PO) Take 1,000 mg by mouth daily.    ? POTASSIUM CHLORIDE PO Take 1 tablet by mouth as needed (muscle cramps).     ? PRALUENT 75 MG/ML SOAJ INJECT 1 PEN INTO THE SKIN EVERY 14 DAYS 2 mL 11  ? rosuvastatin (CRESTOR) 10 MG tablet TAKE 1 TABLET DAILY 90 tablet 2  ? Semaglutide,0.25 or 0.5MG/DOS, (OZEMPIC, 0.25 OR 0.5 MG/DOSE,) 2 MG/1.5ML SOPN Inject 0.5 mg into the skin once a week. 4.5 mL 1  ? TRUE METRIX BLOOD GLUCOSE TEST test strip TEST  UP  TO FOUR TIMES DAILY AS DIRECTED 200 each 12  ? TRUEPLUS LANCETS 33G MISC TEST  UP TO FOUR TIMES DAILY AS DIRECTED 200 each 12  ? ?No current facility-administered medications for this visit.  ? ? ?Allergies:   Patient has no known allergies.  ? ? ? ?ROS:  Please see the history of present illness.   Otherwise, review of systems are  positive for none.   All other systems are reviewed and negative.  ? ? ?PHYSICAL EXAM: ?VS:  BP 126/66 (BP Location: Left Arm, Patient Position: Sitting, Cuff Size: Normal)   Pulse 77   Ht '5\' 2"'  (1.575 m)   Wt 196 lb (88.9 kg)   BMI 35.85 kg/m?  , BMI Body mass index is 35.85 kg/m?. ?GENERAL:  Well appearing ?NECK:  No jugular venous distention, waveform within normal limits, carotid upstroke brisk and symmetric, no bruits, no thyromegaly ?LUNGS:  Clear to auscultation bilaterally ?CHEST:  Unremarkable ?HEART:  PMI not displaced or sustained,S1 and S2 within normal limits, no S3, no S4, no clicks, no rubs, no murmurs ?ABD:  Flat, positive bowel sounds normal in frequency in pitch, no bruits, no rebound, no guarding, no midline pulsatile mass, no hepatomegaly, no splenomegaly ?EXT:  2 plus pulses throughout, right greater than left leg swelling with mild tenderness in the calf , no cyanosis no clubbing ? ? ?EKG:  EKG is not ordered today. ? ? ? ?Recent Labs: ?03/07/2021: ALT 21; Hemoglobin 14.1; Platelets 224.0 ?04/04/2021: BUN 17; Creatinine, Ser 0.84; Potassium 4.3; Sodium 141  ? ? ?Lipid Panel ?   ?Component Value Date/Time  ? CHOL 218 (H) 03/07/2021 1106  ? CHOL 209 (H) 08/26/2018 1018  ? TRIG 54.0 03/07/2021 1106  ? HDL 137.40 03/07/2021 1106  ? HDL 101 08/26/2018 1018  ? CHOLHDL 2 03/07/2021 1106  ? VLDL 10.8 03/07/2021 1106  ? [place] 70 03/07/2021 1106  ? [place] 69 11/10/2019 1122  ? LDLDIRECT 95.9 10/06/2012 1248  ? ?  ?Lab Results  ?Component Value Date  ? HGBA1C 6.6 (H) 03/07/2021  ? ? ? ?Wt Readings from Last 3 Encounters:  ?06/23/21 196 lb (88.9 kg)  ?04/24/21 192 lb (87.1 kg)  ?04/04/21 189 lb 2 oz (85.8 kg)  ?  ? ? ?Other studies Reviewed: ?Additional studies/ records that were reviewed today include:   Doppler ?Review of the above records demonstrates: See elsewhere ? ? ?ASSESSMENT AND PLAN: ? 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  We will continue on current meds ? ?DIASTOLIC HF:    She seems to be euvolemic.  No change in therapy.  ? ?SLEEP APNEA:  She wears CPAP.       ? ?  DYSLIPIDEMIA:   LDL is 70.  She will remain on the meds as listed. ? ?LEG PAIN: Thankfully we were able to add her on for a Doppler today and there was no evidence of DVT.  She has some calf tenderness which may be muscular.  She is going to use Tylenol and warm compresses and let us know if over the weekend this improves. ? ? ?Current medicines are reviewed at length with the patient today.  The patient does not have concerns regarding medicines. ? ?The following changes have been made: None ? ?Labs/ tests ordered today include:      ? ?Orders Placed This Encounter  ?Procedures  ? VAS [name] LOWER EXTREMITY VENOUS (DVT)  ? ? ? ?Disposition:   FU with me as previously scheduled ? ? ?Signed, ?Minus Breeding, MD  ?06/23/2021 5:19 PM    ?[place] ?

## 2021-06-23 NOTE — Telephone Encounter (Signed)
Patient returned phone call ? ?Please call [phone] ?

## 2021-06-23 NOTE — Telephone Encounter (Signed)
Tried to contact patient. No answer. LM on VM to call back to discuss medication assistance program for Ozempic ?

## 2021-06-23 NOTE — Patient Instructions (Signed)
Medication Instructions:  ?Continue same medications ?*If you need a refill on your cardiac medications before your next appointment, please call your pharmacy* ? ? ?Lab Work: ?None ordered ? ? ?Testing/Procedures: ?None ordered ? ? ?Follow-Up: ?At [facility], you and your health needs are our priority.  As part of our continuing mission to provide you with exceptional heart care, we have created designated Provider Care Teams.  These Care Teams include your primary Cardiologist (physician) and Advanced Practice Providers (APPs -  Physician Assistants and Nurse Practitioners) who all work together to provide you with the care you need, when you need it. ? ?We recommend signing up for the patient portal called "MyChart".  Sign up information is provided on this After Visit Summary.  MyChart is used to connect with patients for Virtual Visits (Telemedicine).  Patients are able to view lab/test results, encounter notes, upcoming appointments, etc.  Non-urgent messages can be sent to your provider as well.   ?To learn more about what you can do with MyChart, go to [email or web address].   ? ?Your next appointment:  04/2022 ?  ? ?The format for your next appointment: Office ? ? ?Provider:  Dr.Hochrein ? ? ?Important Information About Sugar ? ? ? ? ? ? ?

## 2021-06-27 ENCOUNTER — Ambulatory Visit (INDEPENDENT_AMBULATORY_CARE_PROVIDER_SITE_OTHER): Payer: Medicare PPO | Admitting: Podiatry

## 2021-06-27 ENCOUNTER — Telehealth: Payer: Self-pay | Admitting: Family Medicine

## 2021-06-27 DIAGNOSIS — M2042 Other hammer toe(s) (acquired), left foot: Secondary | ICD-10-CM | POA: Diagnosis not present

## 2021-06-27 DIAGNOSIS — E1165 Type 2 diabetes mellitus with hyperglycemia: Secondary | ICD-10-CM

## 2021-06-27 DIAGNOSIS — L84 Corns and callosities: Secondary | ICD-10-CM

## 2021-06-27 NOTE — Telephone Encounter (Signed)
Pt called to follow up about her medications state with Tammy. ?

## 2021-06-27 NOTE — Patient Instructions (Signed)
More silicone corn pads and toe caps can be purchased from: ? ?[email or web address] ? ? ?Or from my office  ? ?

## 2021-06-28 MED ORDER — OZEMPIC (0.25 OR 0.5 MG/DOSE) 2 MG/1.5ML ~~LOC~~ SOPN: 0.5000 mg | PEN_INJECTOR | SUBCUTANEOUS | 1 refills | Status: DC

## 2021-06-28 NOTE — Telephone Encounter (Signed)
Patient called back. Explained that since she has [employer] coverage she was not approved for patient assistance for Ozempic. Assisted patient in applying for Copay assistance card.  ?Provided information to pharmacy ?

## 2021-06-28 NOTE — Addendum Note (Signed)
Addended by: Cherre Robins B on: 06/28/2021 04:43 PM ? ? Modules accepted: Orders ? ?

## 2021-06-28 NOTE — Telephone Encounter (Signed)
Please see previous phone message from 06/14/2021 ?

## 2021-06-28 NOTE — Progress Notes (Signed)
?  Subjective:  ?Patient ID: Sierra Ford, female    DOB: Mar 10, 1940,  MRN: [id number] ? ?Chief Complaint  ?Patient presents with  ? Callouses  ?  Painful callus lesion left fifth toe  ? ? ?81 y.o. female presents with the above complaint. History confirmed with patient.  She has always had it but it has been painful about 3 to 4 weeks fairly severely.  She thinks she may have had surgery on her right fifth toe to correct this similar issue before. ? ?Objective:  ?Physical Exam: ?warm, good capillary refill, no trophic changes or ulcerative lesions, normal DP and PT pulses, normal sensory exam, and adductovarus fifth hammertoe on the left foot with hyperkeratosis on the dorsal lateral PIPJ painful to touch. ? ?Assessment:  ? ?1. Callus of foot   ?2. Hammertoe of left foot   ?3. Type 2 diabetes mellitus with hyperglycemia, without long-term current use of insulin ([place])   ? ? ? ?Plan:  ?Patient was evaluated and treated and all questions answered. ? ?All symptomatic hyperkeratoses were safely debrided with a sterile #15 blade to patient's level of comfort without incident. We discussed preventative and palliative care of these lesions including supportive and accommodative shoegear, padding, prefabricated and custom molded accommodative orthoses, use of a pumice stone and lotions/creams daily. ? ?We also discussed the etiology of the callus and how it relates to her hammertoe deformity.  Discussed surgical correction of this if it does not improve with arthroplasty and excision of the corn.  I dispensed liquid toe spacers to see if this will alleviate most of the pain she is having.  She will return as needed. ? ?Return if symptoms worsen or fail to improve.  ? ?

## 2021-06-29 NOTE — Telephone Encounter (Signed)
Pt called stating that she had called [facility] to confirm that they got the request regarding her Ozempic and they had told her that it would be $50/month instead of $50/3 months as she understood it to be.Please Advise. ?

## 2021-06-29 NOTE — Telephone Encounter (Signed)
Called [facility], they did not process information sent with Rx yesterday but they did when I called. Patient is able to get 3 months of Ozempic for $50. Patient is happy with this and will pick up later this week.  ?

## 2021-07-04 MED ORDER — OZEMPIC (0.25 OR 0.5 MG/DOSE) 2 MG/3ML ~~LOC~~ SOPN: 0.5000 mg | PEN_INJECTOR | SUBCUTANEOUS | 1 refills | Status: DC

## 2021-07-04 NOTE — Addendum Note (Signed)
Addended by: Cherre Robins B on: 07/04/2021 02:28 PM ? ? Modules accepted: Orders ? ?

## 2021-07-04 NOTE — Telephone Encounter (Signed)
Patient states pharmacy told her it would be $50 a month instead of $50 for a 3 month supply of ozempic. She would like to know if there are other resources she can use. Please advise.  ?

## 2021-07-04 NOTE — Telephone Encounter (Signed)
Patient called and explained  cost of Ozempic on her insurance and what amount copay card was assisting with.Patient voiced understanding.  ?

## 2021-07-04 NOTE — Telephone Encounter (Signed)
Was told by [facility] pharmacist 06/29/2021 that cost of 3 months of Ozempic would be $50 (see below). However, when patient picked up it was $50 for only 1 month supply. Called to [facility] to get clarification. Pharmacist states that initially Rx was for the old Ozempic 1.77m pen which is no long available. When they switched to new pen that is 360mthe cost per month is $200 on her insurance. When the Ozempic savings card is applied, it only decreases cost by $150 which leaves a cost of $50 per month. A 3 month supply would be $600 and the discount care would lower cost by $450 for 3 months which would be $150 per month. Either way with the newer Ozempic pen (only one available now) the cost is $50 per month.  ? ?EcCherre RobinsRPH-CPP at 06/29/2021  1:09 PM ? ?Status: Signed  ?  ?Called [facility], they did not process information sent with Rx yesterday but they did when I called. Patient is able to get 3 months of Ozempic for $50. Patient is happy with this and will pick up later this week.   ?  ? ?

## 2021-07-10 ENCOUNTER — Telehealth: Payer: Self-pay | Admitting: Cardiology

## 2021-07-10 NOTE — Telephone Encounter (Signed)
Called and relayed megans message and also printed and mailed pt assistance forms and the pt voiced understanding

## 2021-07-10 NOTE — Telephone Encounter (Signed)
Pt c/o medication issue:  1. Name of Medication:   PRALUENT 75 MG/ML SOAJ    2. How are you currently taking this medication (dosage and times per day)?  INJECT 1 PEN INTO THE SKIN EVERY 14 DAYS 3. Are you having a reaction (difficulty breathing--STAT)?  No  4. What is your medication issue?  Pt is wanting to know if someone can help her with getting Pt. Assistance for medication. Pt states that she did have it but the grant ran out. Please advise

## 2021-07-10 NOTE — Telephone Encounter (Signed)
Please relay my response in 4/27 MyChart message to pt. She reached out via MyChart but then did not read the response I sent her.

## 2021-07-13 ENCOUNTER — Telehealth: Payer: Self-pay

## 2021-07-13 NOTE — Telephone Encounter (Addendum)
Patient called in to triage (pt cell [phone]) to report that her grant ran out for praluent. She took her last dose on Monday, and is applying elsewhere for another grant. She wants to know if there is another drug she can take that isn't as expensive.

## 2021-07-13 NOTE — Telephone Encounter (Signed)
Forwarded to pharmacy to help with options.

## 2021-07-13 NOTE — Telephone Encounter (Signed)
She should fill out the grant assistance paperwork she was provided. She is already taking max tolerated dose of rosuvastatin '10mg'$  daily with prior statin intolerance. Used to take Zetia but no longer does. This is the only other generic medication she could resume to help lower her LDL that wouldn't have an expensive branded copay.  Left message for pt to discuss.

## 2021-07-18 ENCOUNTER — Telehealth: Payer: Self-pay

## 2021-07-18 MED ORDER — APIXABAN 2.5 MG PO TABS: 2.5000 mg | ORAL_TABLET | Freq: Two times a day (BID) | ORAL | 5 refills | Status: DC

## 2021-07-18 NOTE — Telephone Encounter (Signed)
  Called in the pts copay card into their pharmacy as requested. And that brought the pts copay to $35

## 2021-07-18 NOTE — Telephone Encounter (Addendum)
Upon further review, pt has Community education officer. Should be able to run both Praluent and Eliquis through this plan instead of her [facility] plan so that she can use copay cards.  Tried to activate Eliquis copay card, stated "duplicate records are found" which means pt should already have an activated copay card. She would need to call [phone] to get this information. She's currently getting Eliquis for free from the manufacturer, they likely do not know she has a Armed forces training and education officer in addition to her Medicare one. On correct Eliquis dose of 2.'5mg'$  BID for prevention of recurrent VTE.  Praluent copay card activated and sent to pharmacy, confirmed copay $35/month.  Ozempic copay $50 after using discount card.  Called pt who was very appreciative for the assistance. She will call Eliquis to get set up with replacement copay card.

## 2021-07-18 NOTE — Telephone Encounter (Signed)
-----   Message from Leeroy Bock, [place] sent at 07/18/2021 12:13 PM EDT ----- Are you able to activate a Praluent copay card and call it into her pharmacy and confirm that her copay is $35 after use? They had previously been running rx under her Medicare insurance but she has a Producer, television/film/video with [facility] that should let the copay card work. Thank you!

## 2021-07-18 NOTE — Addendum Note (Signed)
Addended by: Nik Gorrell E on: 07/18/2021 04:28 PM   Modules accepted: Orders

## 2021-08-04 ENCOUNTER — Encounter: Payer: Self-pay | Admitting: Family Medicine

## 2021-08-04 ENCOUNTER — Ambulatory Visit (INDEPENDENT_AMBULATORY_CARE_PROVIDER_SITE_OTHER): Payer: Medicare PPO | Admitting: Family Medicine

## 2021-08-04 VITALS — BP 122/82 | HR 73 | Temp 98.5°F | Resp 18 | Ht 62.0 in | Wt 193.6 lb

## 2021-08-04 DIAGNOSIS — K648 Other hemorrhoids: Secondary | ICD-10-CM | POA: Diagnosis not present

## 2021-08-04 DIAGNOSIS — H6123 Impacted cerumen, bilateral: Secondary | ICD-10-CM | POA: Diagnosis not present

## 2021-08-04 DIAGNOSIS — M25511 Pain in right shoulder: Secondary | ICD-10-CM | POA: Diagnosis not present

## 2021-08-04 MED ORDER — HYDROCORTISONE ACETATE 25 MG RE SUPP: 25.0000 mg | Freq: Two times a day (BID) | RECTAL | 1 refills | Status: DC

## 2021-08-04 NOTE — Progress Notes (Addendum)
Established Patient Office Visit  Subjective   Patient ID: Sierra Ford, female    DOB: 06-Sep-1940  Age: 81 y.o. MRN: [id number]  Chief Complaint  Patient presents with   Ear Fullness    Pt states unable to hear out of her left ear but feel blocked. Pt states no pain.     HPI Pt is here to have her ears checked---- L ear feels blocked     No pain She also c/o hemorrhoids--- she is requesting a refill and referral to GI Patient Active Problem List   Diagnosis Date Noted   Lower abdominal pain 04/04/2021   Rectal bleeding 04/04/2021   Change in bowel habits 04/04/2021   Internal hemorrhoids 04/04/2021   Preventative health care 03/07/2021   Sacroiliitis ([place]) 03/07/2021   Morbid (severe) obesity due to excess calories ([place]) 03/07/2021   Bronchitis 12/20/2020   Other pulmonary embolism with acute cor pulmonale, unspecified chronicity ([place])    Educated about COVID-19 virus infection 01/26/2019   Hyperlipidemia associated with type 2 diabetes mellitus ([place]) 04/24/2018   Right ankle swelling 05/13/2017   Diabetes mellitus, type II ([place]) 04/08/2017   Essential hypertension 04/08/2017   Atherosclerosis of aorta ([place]) 04/08/2017   Abnormal auditory perception of both ears 10/01/2016   Bilateral impacted cerumen 10/01/2016   OSA (obstructive sleep apnea) 12/16/2015   DOE (dyspnea on exertion) 06/12/2013   Obesity (BMI 30-39.9) 04/24/2013   Chest pain, unspecified 01/22/2011   Abnormal stress test 01/22/2011   Frequent urination 01/22/2011   Chronic diastolic heart failure ([place]) 05/15/2009   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   RASH-NONVESICULAR 04/20/2009   CERVICAL STRAIN, WITH RADICULOPATHY 03/31/2009   CAROTID ARTERY DISEASE 01/12/2009   CERVICAL STRAIN 01/11/2009   LEG EDEMA, RIGHT 07/27/2008   HX, URINARY INFECTION 09/19/2006   Hyperlipidemia LDL goal <70 08/20/2006   PREGNANCY, ECTOPIC NEC W/INTRAUTERINE PRG 08/20/2006   FREQUENCY, URINARY 08/20/2006    Other specified abnormal findings of blood chemistry 08/20/2006   Personal history of venous thrombosis and embolism 08/20/2006   Past Medical History:  Diagnosis Date   Anxiety    Back pain    Chest pain    CLite with apical ischemia in 2006 - normal coronary arteries by [facility] in 12/2004;  Myoview 11/12:  Low risk stress nuclear study with a small, partially reversible apical defect most likely related to apical thinning; cannot R/O very mild apical ischemia.  EF: 75%    Decreased hearing    Depression    Diabetes ([place])    DVT (deep venous thrombosis) ([place]) 2006   hx of   Ectopic pregnancy with intrauterine pregnancy    Hiatal hernia    Hyperlipidemia    PE (pulmonary embolism) 2006   hx of   Sleep apnea    CPAP    Past Surgical History:  Procedure Laterality Date   ABDOMINAL EXPLORATION SURGERY     BUNIONECTOMY     right   COLONOSCOPY  2009   ECTOPIC PREGNANCY SURGERY     FOOT SURGERY     TUBAL LIGATION     Social History   Tobacco Use   Smoking status: Former    Packs/day: 1.00    Years: 50.00    Total pack years: 50.00    Types: Cigarettes    Quit date: 11/21/2009    Years since quitting: 11.9   Smokeless tobacco: Never  Vaping Use   Vaping Use: Never used  Substance Use Topics   Alcohol use:  Yes    Comment: rare 3 times a year per pt   Drug use: No   Social History   Socioeconomic History   Marital status: Divorced    Spouse name: Not on file   Number of children: Not on file   Years of education: Not on file   Highest education level: Not on file  Occupational History   Occupation: Retired Scientist, research (medical)  Tobacco Use   Smoking status: Former    Packs/day: 1.00    Years: 50.00    Total pack years: 50.00    Types: Cigarettes    Quit date: 11/21/2009    Years since quitting: 11.9   Smokeless tobacco: Never  Vaping Use   Vaping Use: Never used  Substance and Sexual Activity   Alcohol use: Yes    Comment: rare 3 times a year per pt   Drug use: No    Sexual activity: Not Currently    Partners: Male  Other Topics Concern   Not on file  Social History Narrative   Regular exercise--- no   Social Determinants of Health   Financial Resource Strain: Medium Risk (03/20/2021)   Overall Financial Resource Strain (CARDIA)    Difficulty of Paying Living Expenses: Somewhat hard  Food Insecurity: No Food Insecurity (08/15/2020)   Hunger Vital Sign    Worried About Running Out of Food in the Last Year: Never true    [place] in the Last Year: Never true  Transportation Needs: No Transportation Needs (02/27/2021)   PRAPARE - Hydrologist (Medical): No    Lack of Transportation (Non-Medical): No  Physical Activity: Sufficiently Active (08/15/2020)   Exercise Vital Sign    Days of Exercise per Week: 4 days    Minutes of Exercise per Session: 60 min  Stress: No Stress Concern Present (08/15/2020)   [place]    Feeling of Stress : Only a little  Social Connections: Moderately Integrated (08/15/2020)   Social Connection and Isolation Panel [NHANES]    Frequency of Communication with Friends and Family: More than three times a week    Frequency of Social Gatherings with Friends and Family: More than three times a week    Attends Religious Services: More than 4 times per year    Active Member of [employer] or Organizations: Yes    Attends Archivist Meetings: 1 to 4 times per year    Marital Status: Widowed  Intimate Partner Violence: Not At Risk (08/15/2020)   Humiliation, Afraid, Rape, and Kick questionnaire    Fear of Current or Ex-Partner: No    Emotionally Abused: No    Physically Abused: No    Sexually Abused: No   Family Status  Relation Name Status   Mother  Deceased at age 78       heart failure   Father  Deceased       unknown causes   Mat Aunt  (Not Specified)   Mat Aunt  (Not Specified)   MGM  Deceased   MGF  Deceased    PGM  Deceased   PGF  Deceased   Neg Hx  (Not Specified)   Family History  Problem Relation Age of Onset   Heart failure Mother        died from   Colon polyps Mother    Hypertension Mother    Sudden death Mother    Obesity Mother  Alcoholism Father    Diabetes Maternal Aunt        grandaughter   Diabetes Maternal Aunt    Colon cancer Neg Hx    Esophageal cancer Neg Hx    Stomach cancer Neg Hx    No Known Allergies    Review of Systems  Constitutional:  Negative for fever and malaise/fatigue.  HENT:  Negative for congestion, ear pain and sore throat.   Eyes:  Negative for blurred vision.  Respiratory:  Negative for cough, shortness of breath and stridor.   Cardiovascular:  Negative for chest pain, palpitations and leg swelling.  Gastrointestinal:  Negative for vomiting.  Musculoskeletal:  Negative for back pain.  Skin:  Negative for rash.  Neurological:  Negative for loss of consciousness and headaches.      Objective:     BP 122/82 (BP Location: Right Arm, Patient Position: Sitting, Cuff Size: Large)   Pulse 73   Temp 98.5 F (36.9 C) (Oral)   Resp 18   Ht '5\' 2"'$  (1.575 m)   Wt 193 lb 9.6 oz (87.8 kg)   SpO2 96%   BMI 35.41 kg/m  BP Readings from Last 3 Encounters:  10/05/21 (!) 149/81  09/04/21 128/78  08/04/21 122/82   Wt Readings from Last 3 Encounters:  09/04/21 195 lb 12.8 oz (88.8 kg)  08/04/21 193 lb 9.6 oz (87.8 kg)  06/23/21 196 lb (88.9 kg)   SpO2 Readings from Last 3 Encounters:  10/05/21 98%  09/04/21 97%  08/04/21 96%      Physical Exam Vitals and nursing note reviewed.  Constitutional:      Appearance: She is well-developed.  HENT:     Head: Normocephalic and atraumatic.     Right Ear: Tympanic membrane, ear canal and external ear normal. There is impacted cerumen.     Left Ear: Tympanic membrane, ear canal and external ear normal. There is impacted cerumen.     Ears:     Comments: Unalble to remove cerumen with hoop Ears  irrigated with no complications  /TMI Eyes:     Conjunctiva/sclera: Conjunctivae normal.  Neck:     Thyroid: No thyromegaly.     Vascular: No carotid bruit or JVD.  Cardiovascular:     Rate and Rhythm: Normal rate and regular rhythm.     Heart sounds: Normal heart sounds. No murmur heard. Pulmonary:     Effort: Pulmonary effort is normal. No respiratory distress.     Breath sounds: Normal breath sounds. No wheezing or rales.  Chest:     Chest wall: No tenderness.  Musculoskeletal:     Cervical back: Normal range of motion and neck supple.  Neurological:     Mental Status: She is alert and oriented to person, place, and time.      No results found for any visits on 08/04/21.  Last CBC Lab Results  Component Value Date   WBC 4.4 09/04/2021   HGB 14.0 09/04/2021   HCT 42.5 09/04/2021   MCV 98.6 09/04/2021   MCH 31.1 01/31/2019   RDW 14.1 09/04/2021   PLT 248.0 02/21/7251   Last metabolic panel Lab Results  Component Value Date   GLUCOSE 112 (H) 09/04/2021   NA 140 09/04/2021   K 4.1 09/04/2021   CL 103 09/04/2021   CO2 28 09/04/2021   BUN 16 09/04/2021   CREATININE 0.70 09/04/2021   GFRNONAA >60 01/31/2019   CALCIUM 9.9 09/04/2021   PROT 6.3 09/04/2021   ALBUMIN 4.1 09/04/2021  LABGLOB 2.4 01/28/2018   AGRATIO 2.1 01/28/2018   BILITOT 1.0 09/04/2021   ALKPHOS 79 09/04/2021   AST 16 09/04/2021   ALT 19 09/04/2021   ANIONGAP 11 01/31/2019   Last lipids Lab Results  Component Value Date   CHOL 201 (H) 09/04/2021   HDL 119.20 09/04/2021   LDLCALC 68 09/04/2021   LDLDIRECT 95.9 10/06/2012   TRIG 68.0 09/04/2021   CHOLHDL 2 09/04/2021   Last hemoglobin A1c Lab Results  Component Value Date   HGBA1C 7.0 (H) 09/04/2021   Last thyroid functions Lab Results  Component Value Date   TSH 1.250 05/27/2017   T3TOTAL 81 05/27/2017   Last vitamin D Lab Results  Component Value Date   VD25OH 39.1 01/28/2018   Last vitamin B12 and Folate No results  found for: "VITAMINB12", "FOLATE"    The ASCVD Risk score (Arnett DK, et al., 2019) failed to calculate for the following reasons:   The 2019 ASCVD risk score is only valid for ages 54 to 69    Assessment & Plan:   Problem List Items Addressed This Visit       Unprioritized   Internal hemorrhoids    anusol supp  Refer to GI      Relevant Medications   hydrocortisone (ANUSOL-HC) 25 MG suppository   Bilateral impacted cerumen - Primary    Unable to remove with hoop Irrigated with no complication at request of pt  Tm and canal normal  rto prn        Return if symptoms worsen or fail to improve.    Ann Held, DO

## 2021-08-04 NOTE — Assessment & Plan Note (Signed)
anusol supp  Refer to GI

## 2021-08-04 NOTE — Patient Instructions (Signed)

## 2021-08-04 NOTE — Assessment & Plan Note (Signed)
Unable to remove with hoop Irrigated with no complication at request of pt  Tm and canal normal  rto prn

## 2021-08-11 ENCOUNTER — Other Ambulatory Visit: Payer: Self-pay | Admitting: Cardiology

## 2021-08-11 ENCOUNTER — Telehealth: Payer: Self-pay | Admitting: Cardiology

## 2021-08-11 MED ORDER — PRALUENT 75 MG/ML ~~LOC~~ SOAJ: SUBCUTANEOUS | 11 refills | Status: DC

## 2021-08-21 ENCOUNTER — Ambulatory Visit: Payer: 59

## 2021-08-23 NOTE — Progress Notes (Signed)
Subjective:   Sierra Ford is a 81 y.o. female who presents for Medicare Annual (Subsequent) preventive examination.  I connected with  BRYNLEY CUDDEBACK on 08/24/21 by a audio enabled telemedicine application and verified that I am speaking with the correct person using two identifiers.  Patient Location: Home  Provider Location: Home Office  I discussed the limitations of evaluation and management by telemedicine. The patient expressed understanding and agreed to proceed.   Review of Systems           Objective:    There were no vitals filed for this visit. There is no height or weight on file to calculate BMI.     08/15/2020   11:47 AM 05/05/2019    1:53 PM 01/28/2019    4:29 PM 10/24/2017    9:38 AM 07/17/2017    9:47 AM  Advanced Directives  Does Patient Have a Medical Advance Directive? No;Yes No Yes Yes No  Type of Dealer Power of Attorney Living will   Does patient want to make changes to medical advance directive?  No - Patient declined No - Patient declined No - Patient declined   Copy of [place] in Chart? No - copy requested  No - copy requested      Current Medications (verified) Outpatient Encounter Medications as of 08/24/2021  Medication Sig   albuterol (VENTOLIN HFA) 108 (90 Base) MCG/ACT inhaler Inhale 2 puffs into the lungs every 6 (six) hours as needed for wheezing or shortness of breath.   Alirocumab (PRALUENT) 75 MG/ML SOAJ INJECT 1 PEN INTO THE SKIN EVERY 14 DAYS   apixaban (ELIQUIS) 2.5 MG TABS tablet Take 1 tablet (2.5 mg total) by mouth 2 (two) times daily.   blood glucose meter kit and supplies KIT Dispense based on patient and insurance preference. Use up to four times daily as directed. (FOR ICD-9 250.00, 250.01).   CALCIUM-VITAMIN D PO Take 600 mg by mouth daily.   cholecalciferol (VITAMIN D3) 10 MCG (400 UNIT) TABS tablet Take 400 Units by mouth daily.   Coenzyme Q10 100  MG TABS Take 100 mg by mouth daily.   Flaxseed, Linseed, (FLAXSEED OIL PO) Take 1,000 mg by mouth daily.    furosemide (LASIX) 40 MG tablet TAKE 1 TABLET DAILY   hydrocortisone (ANUSOL-HC) 25 MG suppository Place 1 suppository (25 mg total) rectally every 12 (twelve) hours.   Insulin Pen Needle (NOVOFINE PLUS PEN NEEDLE) 32G X 4 MM MISC As directed   Multiple Vitamin (MULTIVITAMIN) capsule Take 1 capsule by mouth daily.   Omega-3 Fatty Acids (FISH OIL PO) Take 1,000 mg by mouth daily.   POTASSIUM CHLORIDE PO Take 1 tablet by mouth as needed (muscle cramps).    rosuvastatin (CRESTOR) 10 MG tablet TAKE 1 TABLET DAILY   Semaglutide,0.25 or 0.5MG/DOS, (OZEMPIC, 0.25 OR 0.5 MG/DOSE,) 2 MG/3ML SOPN Inject 0.5 mg into the skin once a week.   TRUE METRIX BLOOD GLUCOSE TEST test strip TEST  UP  TO FOUR TIMES DAILY AS DIRECTED   TRUEPLUS LANCETS 33G MISC TEST  UP TO FOUR TIMES DAILY AS DIRECTED   [DISCONTINUED] omega-3 acid ethyl esters (LOVAZA) 1 G capsule Take 1 g by mouth 3 (three) times daily.     [DISCONTINUED] pravastatin (PRAVACHOL) 20 MG tablet Take 20 mg by mouth daily.     No facility-administered encounter medications on file as of 08/24/2021.    Allergies (verified) Patient has no known  allergies.   History: Past Medical History:  Diagnosis Date   Anxiety    Back pain    Chest pain    CLite with apical ischemia in 2006 - normal coronary arteries by [facility] in 12/2004;  Myoview 11/12:  Low risk stress nuclear study with a small, partially reversible apical defect most likely related to apical thinning; cannot R/O very mild apical ischemia.  EF: 75%    Decreased hearing    Depression    Diabetes ([place])    DVT (deep venous thrombosis) ([place]) 2006   hx of   Ectopic pregnancy with intrauterine pregnancy    Hiatal hernia    Hyperlipidemia    PE (pulmonary embolism) 2006   hx of   Sleep apnea    CPAP    Past Surgical History:  Procedure Laterality Date   ABDOMINAL EXPLORATION SURGERY      BUNIONECTOMY     right   COLONOSCOPY  2009   ECTOPIC PREGNANCY SURGERY     FOOT SURGERY     TUBAL LIGATION     Family History  Problem Relation Age of Onset   Heart failure Mother        died from   Colon polyps Mother    Hypertension Mother    Sudden death Mother    Obesity Mother    Alcoholism Father    Diabetes Maternal Aunt        grandaughter   Diabetes Maternal Aunt    Colon cancer Neg Hx    Esophageal cancer Neg Hx    Stomach cancer Neg Hx    Social History   Socioeconomic History   Marital status: Divorced    Spouse name: Not on file   Number of children: Not on file   Years of education: Not on file   Highest education level: Not on file  Occupational History   Occupation: Retired Scientist, research (medical)  Tobacco Use   Smoking status: Former    Packs/day: 1.00    Years: 50.00    Total pack years: 50.00    Types: Cigarettes    Quit date: 11/21/2009    Years since quitting: 11.7   Smokeless tobacco: Never  Vaping Use   Vaping Use: Never used  Substance and Sexual Activity   Alcohol use: Yes    Comment: rare 3 times a year per pt   Drug use: No   Sexual activity: Not Currently    Partners: Male  Other Topics Concern   Not on file  Social History Narrative   Regular exercise--- no   Social Determinants of Health   Financial Resource Strain: Medium Risk (03/20/2021)   Overall Financial Resource Strain (CARDIA)    Difficulty of Paying Living Expenses: Somewhat hard  Food Insecurity: No Food Insecurity (08/15/2020)   Hunger Vital Sign    Worried About Running Out of Food in the Last Year: Never true    Ran Out of Food in the Last Year: Never true  Transportation Needs: No Transportation Needs (02/27/2021)   PRAPARE - Hydrologist (Medical): No    Lack of Transportation (Non-Medical): No  Physical Activity: Sufficiently Active (08/15/2020)   Exercise Vital Sign    Days of Exercise per Week: 4 days    Minutes of Exercise per  Session: 60 min  Stress: No Stress Concern Present (08/15/2020)   [place]    Feeling of Stress : Only a little  Social Connections: Moderately Integrated (08/15/2020)   Social Connection and Isolation Panel [NHANES]    Frequency of Communication with Friends and Family: More than three times a week    Frequency of Social Gatherings with Friends and Family: More than three times a week    Attends Religious Services: More than 4 times per year    Active Member of [employer] or Organizations: Yes    Attends Archivist Meetings: 1 to 4 times per year    Marital Status: Widowed    Tobacco Counseling Counseling given: Not Answered   Clinical Intake:                 Diabetic?yes  Nutrition Risk Assessment:  Has the patient had any N/V/D within the last 2 months?  No  Does the patient have any non-healing wounds?  No  Has the patient had any unintentional weight loss or weight gain?  No   Diabetes:  Is the patient diabetic?  Yes  If diabetic, was a CBG obtained today?  No  Did the patient bring in their glucometer from home?  No  How often do you monitor your CBG's? N/a.   Financial Strains and Diabetes Management:  Are you having any financial strains with the device, your supplies or your medication? No .  Does the patient want to be seen by Chronic Care Management for management of their diabetes?  No  Would the patient like to be referred to a Nutritionist or for Diabetic Management?  No   Diabetic Exams:  Diabetic Eye Exam: Completed 10/20/20 Diabetic Foot Exam: Overdue, Pt has been advised about the importance in completing this exam. Pt is scheduled for diabetic foot exam on n/a.          Activities of Daily Living     No data to display          Patient Care Team: Carollee Herter, Alferd Apa, DO as PCP - General Minus Breeding, MD as PCP - Cardiology (Cardiology) Maisie Fus, MD  as Consulting Physician (Obstetrics and Gynecology) Cherre Robins, [place] (Pharmacist) Rigoberto Noel, MD as Consulting Physician (Pulmonary Disease)  Indicate any recent Medical Services you may have received from other than [facility] providers in the past year (date may be approximate).     Assessment:   This is a routine wellness examination for Sierra Ford.  Hearing/Vision screen No results found.  Dietary issues and exercise activities discussed:     Goals Addressed   None    Depression Screen    08/15/2020   11:51 AM 08/11/2020   10:28 AM 05/10/2020   10:31 AM 05/05/2019    2:01 PM 05/27/2017   11:14 AM 10/09/2016    4:06 PM 07/15/2015   11:11 AM  PHQ 2/9 Scores  PHQ - 2 Score 0 0 0 0 2 0 0  PHQ- 9 Score     15      Fall Risk    08/15/2020   11:49 AM 08/11/2020   10:28 AM 05/10/2020   10:31 AM 05/05/2019    2:00 PM 07/15/2015   11:11 AM  Fall Risk   Falls in the past year? 0 0 0 0 No  Number falls in past yr: 0 0 0 0   Injury with Fall? 0 0 0 0   Risk for fall due to :   Impaired mobility    Follow up Falls prevention discussed  Falls evaluation completed Education provided;Falls prevention discussed  FALL RISK PREVENTION PERTAINING TO THE HOME:  Any stairs in or around the home? No  If so, are there any without handrails?  N/A Home free of loose throw rugs in walkways, pet beds, electrical cords, etc? Yes  Adequate lighting in your home to reduce risk of falls? Yes   ASSISTIVE DEVICES UTILIZED TO PREVENT FALLS:  Life alert? Yes  Use of a cane, walker or w/c? Yes  Grab bars in the bathroom? Yes  Shower chair or bench in shower? Yes  Elevated toilet seat or a handicapped toilet? No   TIMED UP AND GO:  Was the test performed? No .    Cognitive Function:    10/09/2016    4:07 PM 07/15/2015   11:42 AM  MMSE - Mini Mental State Exam  Orientation to time 5 5  Orientation to Place 5 5  Registration 3 3  Attention/ Calculation 5 5  Recall 3 3  Language- name 2  objects 2 2  Language- repeat 1 1  Language- follow 3 step command 3 3  Language- read & follow direction 1 1  Write a sentence 1 1  Copy design 1 1  Total score 30 30        Immunizations Immunization History  Administered Date(s) Administered   Fluad Quad(high Dose 65+) 12/04/2019, 11/03/2020, 12/29/2020   Influenza, High Dose Seasonal PF 12/22/2018   PFIZER Comirnaty(Gray Top)Covid-19 Tri-Sucrose Vaccine 06/08/2020   PFIZER(Purple Top)SARS-COV-2 Vaccination 03/06/2019, 03/27/2019, 11/10/2019   Pfizer Covid-19 Vaccine Bivalent Booster 37yr & up 12/06/2020   Pneumococcal Conjugate-13 04/24/2013   Pneumococcal Polysaccharide-23 08/07/2007, 12/04/2019   Td 03/03/2003   Tdap 11/06/2018   Zoster Recombinat (Shingrix) 11/06/2018, 05/11/2019   Zoster, Live 08/14/2007, 11/13/2007    TDAP status: Up to date  Flu Vaccine status: Up to date  Pneumococcal vaccine status: Up to date  Covid-19 vaccine status: Completed vaccines  Qualifies for Shingles Vaccine? Yes   Zostavax completed No   Shingrix Completed?: Yes  Screening Tests Health Maintenance  Topic Date Due   COVID-19 Vaccine (6 - Pfizer series) 04/08/2021   MAMMOGRAM  07/15/2021   FOOT EXAM  08/11/2021   HEMOGLOBIN A1C  09/04/2021   INFLUENZA VACCINE  09/19/2021   OPHTHALMOLOGY EXAM  10/20/2021   URINE MICROALBUMIN  03/07/2022   TETANUS/TDAP  11/05/2028   Pneumonia Vaccine 80 Years old  Completed   DEXA SCAN  Completed   Hepatitis C Screening  Completed   Zoster Vaccines- Shingrix  Completed   HPV VACCINES  Aged Out    Health Maintenance  Health Maintenance Due  Topic Date Due   COVID-19 Vaccine (6 - Pfizer series) 04/08/2021   MAMMOGRAM  07/15/2021   FOOT EXAM  08/11/2021    Colorectal cancer screening: No longer required.   Mammogram status: Ordered scheduled for august . Pt provided with contact info and advised to call to schedule appt.   Bone Density status: Ordered declined. Pt provided with  contact info and advised to call to schedule appt.  Lung Cancer Screening: (Low Dose CT Chest recommended if Age 625-80years, 30 pack-year currently smoking OR have quit w/in 15years.) does not qualify.   Lung Cancer Screening Referral: N/A  Additional Screening:  Hepatitis C Screening: does qualify; Completed 05/10/20  Vision Screening: Recommended annual ophthalmology exams for early detection of glaucoma and other disorders of the eye. Is the patient up to date with their annual eye exam?  No  Who is the provider or what is the  name of the office in which the patient attends annual eye exams? [facility] If pt is not established with a provider, would they like to be referred to a provider to establish care? No .   Dental Screening: Recommended annual dental exams for proper oral hygiene  Community Resource Referral / Chronic Care Management: CRR required this visit?  No   CCM required this visit?  No      Plan:     I have personally reviewed and noted the following in the patient's chart:   Medical and social history Use of alcohol, tobacco or illicit drugs  Current medications and supplements including opioid prescriptions.  Functional ability and status Nutritional status Physical activity Advanced directives List of other physicians Hospitalizations, surgeries, and ER visits in previous 12 months Vitals Screenings to include cognitive, depression, and falls Referrals and appointments  In addition, I have reviewed and discussed with patient certain preventive protocols, quality metrics, and best practice recommendations. A written personalized care plan for preventive services as well as general preventive health recommendations were provided to patient.   Due to this being a telephonic visit, the after visit summary with patients personalized plan was offered to patient via mail or my-chart. Patient would like to access on my-chart.  Duard Brady Isaura Schiller,  CMA   08/23/2021   Nurse Notes: none

## 2021-08-24 ENCOUNTER — Telehealth: Payer: Self-pay

## 2021-08-24 ENCOUNTER — Ambulatory Visit (INDEPENDENT_AMBULATORY_CARE_PROVIDER_SITE_OTHER): Payer: Medicare PPO

## 2021-08-24 DIAGNOSIS — Z0000 Encounter for general adult medical examination without abnormal findings: Secondary | ICD-10-CM | POA: Diagnosis not present

## 2021-08-24 NOTE — Patient Instructions (Signed)
Sierra Ford , Thank you for taking time to come for your Medicare Wellness Visit. I appreciate your ongoing commitment to your health goals. Please review the following plan we discussed and let me know if I can assist you in the future.   Screening recommendations/referrals: Colonoscopy: no longer needed Mammogram: scheduled for August Bone Density: declined Recommended yearly ophthalmology/optometry visit for glaucoma screening and checkup Recommended yearly dental visit for hygiene and checkup  Vaccinations: Influenza vaccine: up to date Pneumococcal vaccine: up to date Tdap vaccine: up to date Shingles vaccine: up to date   Covid-19:completed  Advanced directives: yes, not on file  Conditions/risks identified: see problem list   Next appointment: Follow up in one year for your annual wellness visit 08/28/22   Preventive Care 48 Years and Older, Female Preventive care refers to lifestyle choices and visits with your health care provider that can promote health and wellness. What does preventive care include? A yearly physical exam. This is also called an annual well check. Dental exams once or twice a year. Routine eye exams. Ask your health care provider how often you should have your eyes checked. Personal lifestyle choices, including: Daily care of your teeth and gums. Regular physical activity. Eating a healthy diet. Avoiding tobacco and drug use. Limiting alcohol use. Practicing safe sex. Taking low-dose aspirin every day. Taking vitamin and mineral supplements as recommended by your health care provider. What happens during an annual well check? The services and screenings done by your health care provider during your annual well check will depend on your age, overall health, lifestyle risk factors, and family history of disease. Counseling  Your health care provider may ask you questions about your: Alcohol use. Tobacco use. Drug use. Emotional well-being. Home  and relationship well-being. Sexual activity. Eating habits. History of falls. Memory and ability to understand (cognition). Work and work Statistician. Reproductive health. Screening  You may have the following tests or measurements: Height, weight, and BMI. Blood pressure. Lipid and cholesterol levels. These may be checked every 5 years, or more frequently if you are over 8 years old. Skin check. Lung cancer screening. You may have this screening every year starting at age 61 if you have a 30-pack-year history of smoking and currently smoke or have quit within the past 15 years. Fecal occult blood test (FOBT) of the stool. You may have this test every year starting at age 69. Flexible sigmoidoscopy or colonoscopy. You may have a sigmoidoscopy every 5 years or a colonoscopy every 10 years starting at age 77. Hepatitis C blood test. Hepatitis B blood test. Sexually transmitted disease (STD) testing. Diabetes screening. This is done by checking your blood sugar (glucose) after you have not eaten for a while (fasting). You may have this done every 1-3 years. Bone density scan. This is done to screen for osteoporosis. You may have this done starting at age 1. Mammogram. This may be done every 1-2 years. Talk to your health care provider about how often you should have regular mammograms. Talk with your health care provider about your test results, treatment options, and if necessary, the need for more tests. Vaccines  Your health care provider may recommend certain vaccines, such as: Influenza vaccine. This is recommended every year. Tetanus, diphtheria, and acellular pertussis (Tdap, Td) vaccine. You may need a Td booster every 10 years. Zoster vaccine. You may need this after age 10. Pneumococcal 13-valent conjugate (PCV13) vaccine. One dose is recommended after age 41. Pneumococcal polysaccharide (PPSV23) vaccine. One  dose is recommended after age 61. Talk to your health care provider  about which screenings and vaccines you need and how often you need them. This information is not intended to replace advice given to you by your health care provider. Make sure you discuss any questions you have with your health care provider. Document Released: 03/04/2015 Document Revised: 10/26/2015 Document Reviewed: 12/07/2014 Elsevier Interactive Patient Education  2017 [employer] Prevention in the Home Falls can cause injuries. They can happen to people of all ages. There are many things you can do to make your home safe and to help prevent falls. What can I do on the outside of my home? Regularly fix the edges of walkways and driveways and fix any cracks. Remove anything that might make you trip as you walk through a door, such as a raised step or threshold. Trim any bushes or trees on the path to your home. Use bright outdoor lighting. Clear any walking paths of anything that might make someone trip, such as rocks or tools. Regularly check to see if handrails are loose or broken. Make sure that both sides of any steps have handrails. Any raised decks and porches should have guardrails on the edges. Have any leaves, snow, or ice cleared regularly. Use sand or salt on walking paths during winter. Clean up any spills in your garage right away. This includes oil or grease spills. What can I do in the bathroom? Use night lights. Install grab bars by the toilet and in the tub and shower. Do not use towel bars as grab bars. Use non-skid mats or decals in the tub or shower. If you need to sit down in the shower, use a plastic, non-slip stool. Keep the floor dry. Clean up any water that spills on the floor as soon as it happens. Remove soap buildup in the tub or shower regularly. Attach bath mats securely with double-sided non-slip rug tape. Do not have throw rugs and other things on the floor that can make you trip. What can I do in the bedroom? Use night lights. Make sure  that you have a light by your bed that is easy to reach. Do not use any sheets or blankets that are too big for your bed. They should not hang down onto the floor. Have a firm chair that has side arms. You can use this for support while you get dressed. Do not have throw rugs and other things on the floor that can make you trip. What can I do in the kitchen? Clean up any spills right away. Avoid walking on wet floors. Keep items that you use a lot in easy-to-reach places. If you need to reach something above you, use a strong step stool that has a grab bar. Keep electrical cords out of the way. Do not use floor polish or wax that makes floors slippery. If you must use wax, use non-skid floor wax. Do not have throw rugs and other things on the floor that can make you trip. What can I do with my stairs? Do not leave any items on the stairs. Make sure that there are handrails on both sides of the stairs and use them. Fix handrails that are broken or loose. Make sure that handrails are as long as the stairways. Check any carpeting to make sure that it is firmly attached to the stairs. Fix any carpet that is loose or worn. Avoid having throw rugs at the top or bottom of the  stairs. If you do have throw rugs, attach them to the floor with carpet tape. Make sure that you have a light switch at the top of the stairs and the bottom of the stairs. If you do not have them, ask someone to add them for you. What else can I do to help prevent falls? Wear shoes that: Do not have high heels. Have rubber bottoms. Are comfortable and fit you well. Are closed at the toe. Do not wear sandals. If you use a stepladder: Make sure that it is fully opened. Do not climb a closed stepladder. Make sure that both sides of the stepladder are locked into place. Ask someone to hold it for you, if possible. Clearly mark and make sure that you can see: Any grab bars or handrails. First and last steps. Where the edge of  each step is. Use tools that help you move around (mobility aids) if they are needed. These include: Canes. Walkers. Scooters. Crutches. Turn on the lights when you go into a dark area. Replace any light bulbs as soon as they burn out. Set up your furniture so you have a clear path. Avoid moving your furniture around. If any of your floors are uneven, fix them. If there are any pets around you, be aware of where they are. Review your medicines with your doctor. Some medicines can make you feel dizzy. This can increase your chance of falling. Ask your doctor what other things that you can do to help prevent falls. This information is not intended to replace advice given to you by your health care provider. Make sure you discuss any questions you have with your health care provider. Document Released: 12/02/2008 Document Revised: 07/14/2015 Document Reviewed: 03/12/2014 Elsevier Interactive Patient Education  2017 [employer].

## 2021-08-24 NOTE — Telephone Encounter (Signed)
Spoke with pt today for awv but she had some concerns about the swelling in her legs and bruising. Pt states that she doesn't know if the bruising is from her medication.

## 2021-08-28 NOTE — Telephone Encounter (Signed)
Pt aware and has appt 09/04/21

## 2021-09-01 ENCOUNTER — Telehealth: Payer: Self-pay | Admitting: Cardiology

## 2021-09-01 NOTE — Telephone Encounter (Signed)
*  STAT* If patient is at the pharmacy, call can be transferred to refill team.   1. Which medications need to be refilled? (please list name of each medication and dose if known)  new prescription for Rosuvastatin  2. Which pharmacy/location (including street and city if local pharmacy) is medication to be sent to? [facility]  3. Do they need a 30 day or 90 day supply? 90 days and refills

## 2021-09-04 ENCOUNTER — Ambulatory Visit (INDEPENDENT_AMBULATORY_CARE_PROVIDER_SITE_OTHER): Payer: Medicare PPO | Admitting: Family Medicine

## 2021-09-04 ENCOUNTER — Encounter: Payer: Self-pay | Admitting: Family Medicine

## 2021-09-04 ENCOUNTER — Ambulatory Visit (HOSPITAL_BASED_OUTPATIENT_CLINIC_OR_DEPARTMENT_OTHER)
Admission: RE | Admit: 2021-09-04 | Discharge: 2021-09-04 | Disposition: A | Discharge status: Home / Self Care | Payer: Medicare PPO | Source: Ambulatory Visit | Attending: Family Medicine | Admitting: Family Medicine

## 2021-09-04 VITALS — BP 128/78 | HR 90 | Temp 98.0°F | Resp 20 | Ht 62.0 in | Wt 195.8 lb

## 2021-09-04 DIAGNOSIS — I5032 Chronic diastolic (congestive) heart failure: Secondary | ICD-10-CM | POA: Diagnosis not present

## 2021-09-04 DIAGNOSIS — M25552 Pain in left hip: Secondary | ICD-10-CM | POA: Insufficient documentation

## 2021-09-04 DIAGNOSIS — M25551 Pain in right hip: Secondary | ICD-10-CM | POA: Diagnosis not present

## 2021-09-04 DIAGNOSIS — I10 Essential (primary) hypertension: Secondary | ICD-10-CM

## 2021-09-04 DIAGNOSIS — E1165 Type 2 diabetes mellitus with hyperglycemia: Secondary | ICD-10-CM | POA: Diagnosis not present

## 2021-09-04 DIAGNOSIS — E785 Hyperlipidemia, unspecified: Secondary | ICD-10-CM

## 2021-09-04 DIAGNOSIS — E1169 Type 2 diabetes mellitus with other specified complication: Secondary | ICD-10-CM

## 2021-09-04 LAB — COMPREHENSIVE METABOLIC PANEL
ALT: 19 U/L (ref 0–35)
AST: 16 U/L (ref 0–37)
Albumin: 4.1 g/dL (ref 3.5–5.2)
Alkaline Phosphatase: 79 U/L (ref 39–117)
BUN: 16 mg/dL (ref 6–23)
CO2: 28 mEq/L (ref 19–32)
Calcium: 9.9 mg/dL (ref 8.4–10.5)
Chloride: 103 mEq/L (ref 96–112)
Creatinine, Ser: 0.7 mg/dL (ref 0.40–1.20)
GFR: 81.94 mL/min (ref >60.00)
Glucose, Bld: 112 mg/dL — ABNORMAL HIGH (ref 70–99)
Potassium: 4.1 mEq/L (ref 3.5–5.1)
Sodium: 140 mEq/L (ref 135–145)
Total Bilirubin: 1 mg/dL (ref 0.2–1.2)
Total Protein: 6.3 g/dL (ref 6.0–8.3)

## 2021-09-04 LAB — CBC WITH DIFFERENTIAL/PLATELET
Basophils Absolute: 0 10*3/uL (ref 0.0–0.1)
Basophils Relative: 0.5 % (ref 0.0–3.0)
Eosinophils Absolute: 0.1 10*3/uL (ref 0.0–0.7)
Eosinophils Relative: 1.8 % (ref 0.0–5.0)
HCT: 42.5 % (ref 36.0–46.0)
Hemoglobin: 14 g/dL (ref 12.0–15.0)
Lymphocytes Relative: 31.8 % (ref 12.0–46.0)
Lymphs Abs: 1.4 10*3/uL (ref 0.7–4.0)
MCHC: 32.8 g/dL (ref 30.0–36.0)
MCV: 98.6 fl (ref 78.0–100.0)
Monocytes Absolute: 0.5 10*3/uL (ref 0.1–1.0)
Monocytes Relative: 10.9 % (ref 3.0–12.0)
Neutro Abs: 2.4 10*3/uL (ref 1.4–7.7)
Neutrophils Relative %: 55 % (ref 43.0–77.0)
Platelets: 248 10*3/uL (ref 150.0–400.0)
RBC: 4.32 Mil/uL (ref 3.87–5.11)
RDW: 14.1 % (ref 11.5–15.5)
WBC: 4.4 10*3/uL (ref 4.0–10.5)

## 2021-09-04 LAB — LIPID PANEL
Cholesterol: 201 mg/dL — ABNORMAL HIGH (ref 0–200)
HDL: 119.2 mg/dL (ref >39.00)
LDL Cholesterol: 68 mg/dL (ref 0–99)
NonHDL: 81.79
Total CHOL/HDL Ratio: 2
Triglycerides: 68 mg/dL (ref 0.0–149.0)
VLDL: 13.6 mg/dL (ref 0.0–40.0)

## 2021-09-04 LAB — HEMOGLOBIN A1C: Hgb A1c MFr Bld: 7 % — ABNORMAL HIGH (ref 4.6–6.5)

## 2021-09-04 LAB — MICROALBUMIN / CREATININE URINE RATIO
Creatinine,U: 87.6 mg/dL
Microalb Creat Ratio: 1.2 mg/g (ref 0.0–30.0)
Microalb, Ur: 1.1 mg/dL (ref 0.0–1.9)

## 2021-09-04 MED ORDER — SEMAGLUTIDE (1 MG/DOSE) 4 MG/3ML ~~LOC~~ SOPN: 1.0000 mg | PEN_INJECTOR | SUBCUTANEOUS | 3 refills | Status: DC

## 2021-09-04 MED ORDER — ROSUVASTATIN CALCIUM 10 MG PO TABS: 10.0000 mg | ORAL_TABLET | Freq: Every day | ORAL | 2 refills | Status: DC

## 2021-09-04 NOTE — Assessment & Plan Note (Signed)
Tolerating statin, encouraged heart healthy diet, avoid trans fats, minimize simple carbs and saturated fats. Increase exercise as tolerated 

## 2021-09-04 NOTE — Assessment & Plan Note (Signed)
Encourage heart healthy diet such as MIND or DASH diet, increase exercise, avoid trans fats, simple carbohydrates and processed foods, consider a krill or fish or flaxseed oil cap daily.  °

## 2021-09-04 NOTE — Patient Instructions (Signed)

## 2021-09-04 NOTE — Assessment & Plan Note (Signed)
Per cardiology con't meds 

## 2021-09-04 NOTE — Progress Notes (Addendum)
Subjective:   By signing my name below, I, Carylon Perches, attest that this documentation has been prepared under the direction and in the presence of Ann Held DO 09/04/2021   Patient ID: Sierra Ford, female    DOB: 07-15-1940, 81 y.o.   MRN: [id number]  Chief Complaint  Patient presents with   Hypertension   Hyperlipidemia   Diabetes   Follow-up    HPI Patient is in today for an office visit  She complains that she cannot see. She states that her vision is cloudy and believes that her symptoms are due to her diabetes. She is scheduled to see an eye specialist for her symptoms.   She complains that her ankles are swelling. She states that she has not been walking as frequently as normal. She denies of any chest pain or worsening of SOB. She is not drinking a lot of water and does not regularly take her medication of 40 Mg of Lasix.   She complains of pain in her right pain that occurs when she turns in bed. She is currently seeing a specialist at [facility].  She is regularly checking her blood sugar levels. She reports a range of 115 - 129 mg/dL. She is currently taking 0.5 Mg/Dos of Ozempic. She states that she is doing well on the injections however, she reports that she is not losing weight. She is interested in increasing her dosage of the medication. She states that medication of the price of the medication has increased.  Lab Results  Component Value Date   HGBA1C 7.0 (H) 09/04/2021    She states that the cortisone injections for her shoulders are not lasting as long as they used to. The relief from the injections last about 2 weeks for her. Also, she is due for Dexa scan.   She has her mammogram scheduled at [facility].  Past Medical History:  Diagnosis Date   Anxiety    Back pain    Chest pain    CLite with apical ischemia in 2006 - normal coronary arteries by [facility] in 12/2004;  Myoview 11/12:  Low risk stress nuclear study with a small,  partially reversible apical defect most likely related to apical thinning; cannot R/O very mild apical ischemia.  EF: 75%    Decreased hearing    Depression    Diabetes ([place])    DVT (deep venous thrombosis) ([place]) 2006   hx of   Ectopic pregnancy with intrauterine pregnancy    Hiatal hernia    Hyperlipidemia    PE (pulmonary embolism) 2006   hx of   Sleep apnea    CPAP     Past Surgical History:  Procedure Laterality Date   ABDOMINAL EXPLORATION SURGERY     BUNIONECTOMY     right   COLONOSCOPY  2009   ECTOPIC PREGNANCY SURGERY     FOOT SURGERY     TUBAL LIGATION      Family History  Problem Relation Age of Onset   Heart failure Mother        died from   Colon polyps Mother    Hypertension Mother    Sudden death Mother    Obesity Mother    Alcoholism Father    Diabetes Maternal Aunt        grandaughter   Diabetes Maternal Aunt    Colon cancer Neg Hx    Esophageal cancer Neg Hx    Stomach cancer Neg Hx  Social History   Socioeconomic History   Marital status: Divorced    Spouse name: Not on file   Number of children: Not on file   Years of education: Not on file   Highest education level: Not on file  Occupational History   Occupation: Retired Scientist, research (medical)  Tobacco Use   Smoking status: Former    Packs/day: 1.00    Years: 50.00    Total pack years: 50.00    Types: Cigarettes    Quit date: 11/21/2009    Years since quitting: 11.7   Smokeless tobacco: Never  Vaping Use   Vaping Use: Never used  Substance and Sexual Activity   Alcohol use: Yes    Comment: rare 3 times a year per pt   Drug use: No   Sexual activity: Not Currently    Partners: Male  Other Topics Concern   Not on file  Social History Narrative   Regular exercise--- no   Social Determinants of Health   Financial Resource Strain: Medium Risk (03/20/2021)   Overall Financial Resource Strain (CARDIA)    Difficulty of Paying Living Expenses: Somewhat hard  Food Insecurity: No Food  Insecurity (08/15/2020)   Hunger Vital Sign    Worried About Running Out of Food in the Last Year: Never true    [place] in the Last Year: Never true  Transportation Needs: No Transportation Needs (02/27/2021)   PRAPARE - Hydrologist (Medical): No    Lack of Transportation (Non-Medical): No  Physical Activity: Sufficiently Active (08/15/2020)   Exercise Vital Sign    Days of Exercise per Week: 4 days    Minutes of Exercise per Session: 60 min  Stress: No Stress Concern Present (08/15/2020)   [place]    Feeling of Stress : Only a little  Social Connections: Moderately Integrated (08/15/2020)   Social Connection and Isolation Panel [NHANES]    Frequency of Communication with Friends and Family: More than three times a week    Frequency of Social Gatherings with Friends and Family: More than three times a week    Attends Religious Services: More than 4 times per year    Active Member of [employer] or Organizations: Yes    Attends Archivist Meetings: 1 to 4 times per year    Marital Status: Widowed  Intimate Partner Violence: Not At Risk (08/15/2020)   Humiliation, Afraid, Rape, and Kick questionnaire    Fear of Current or Ex-Partner: No    Emotionally Abused: No    Physically Abused: No    Sexually Abused: No    Outpatient Medications Prior to Visit  Medication Sig Dispense Refill   albuterol (VENTOLIN HFA) 108 (90 Base) MCG/ACT inhaler Inhale 2 puffs into the lungs every 6 (six) hours as needed for wheezing or shortness of breath. 8 g 2   Alirocumab (PRALUENT) 75 MG/ML SOAJ INJECT 1 PEN INTO THE SKIN EVERY 14 DAYS 2 mL 11   apixaban (ELIQUIS) 2.5 MG TABS tablet Take 1 tablet (2.5 mg total) by mouth 2 (two) times daily. 60 tablet 5   blood glucose meter kit and supplies KIT Dispense based on patient and insurance preference. Use up to four times daily as directed. (FOR ICD-9  250.00, 250.01). 1 each 0   CALCIUM-VITAMIN D PO Take 600 mg by mouth daily.     cholecalciferol (VITAMIN D3) 10 MCG (400 UNIT) TABS tablet Take 400 Units  by mouth daily.     Coenzyme Q10 100 MG TABS Take 100 mg by mouth daily.     Flaxseed, Linseed, (FLAXSEED OIL PO) Take 1,000 mg by mouth daily.      furosemide (LASIX) 40 MG tablet TAKE 1 TABLET DAILY 90 tablet 2   hydrocortisone (ANUSOL-HC) 25 MG suppository Place 1 suppository (25 mg total) rectally every 12 (twelve) hours. 12 suppository 1   Insulin Pen Needle (NOVOFINE PLUS PEN NEEDLE) 32G X 4 MM MISC As directed 100 each 1   Multiple Vitamin (MULTIVITAMIN) capsule Take 1 capsule by mouth daily.     Omega-3 Fatty Acids (FISH OIL PO) Take 1,000 mg by mouth daily.     POTASSIUM CHLORIDE PO Take 1 tablet by mouth as needed (muscle cramps).      TRUE METRIX BLOOD GLUCOSE TEST test strip TEST  UP  TO FOUR TIMES DAILY AS DIRECTED 200 each 12   TRUEPLUS LANCETS 33G MISC TEST  UP TO FOUR TIMES DAILY AS DIRECTED 200 each 12   rosuvastatin (CRESTOR) 10 MG tablet TAKE 1 TABLET DAILY 90 tablet 2   Semaglutide,0.25 or 0.5MG/DOS, (OZEMPIC, 0.25 OR 0.5 MG/DOSE,) 2 MG/3ML SOPN Inject 0.5 mg into the skin once a week. 9 mL 1   No facility-administered medications prior to visit.    No Known Allergies  Review of Systems  Eyes:  Positive for blurred vision (Cloudy).  Respiratory:  Negative for shortness of breath.   Cardiovascular:  Positive for leg swelling (Bilateral Ankles). Negative for chest pain.       Objective:    Physical Exam Constitutional:      General: She is not in acute distress.    Appearance: Normal appearance. She is not ill-appearing.  HENT:     Head: Normocephalic and atraumatic.     Right Ear: External ear normal.     Left Ear: External ear normal.  Eyes:     Extraocular Movements: Extraocular movements intact.     Pupils: Pupils are equal, round, and reactive to light.  Cardiovascular:     Rate and Rhythm: Normal  rate and regular rhythm.     Heart sounds: Normal heart sounds. No murmur heard.    No gallop.  Pulmonary:     Effort: Pulmonary effort is normal. No respiratory distress.     Breath sounds: Normal breath sounds. No wheezing or rales.  Musculoskeletal:     Right lower leg: 2+ Pitting Edema present.     Left lower leg: 2+ Pitting Edema present.  Skin:    General: Skin is warm and dry.  Neurological:     Mental Status: She is alert and oriented to person, place, and time.  Psychiatric:        Judgment: Judgment normal.     BP 128/78 (BP Location: Left Arm, Patient Position: Sitting, Cuff Size: Large)   Pulse 90   Temp 98 F (36.7 C) (Oral)   Resp 20   Ht '5\' 2"'  (1.575 m)   Wt 195 lb 12.8 oz (88.8 kg)   SpO2 97%   BMI 35.81 kg/m  Wt Readings from Last 3 Encounters:  09/04/21 195 lb 12.8 oz (88.8 kg)  08/04/21 193 lb 9.6 oz (87.8 kg)  06/23/21 196 lb (88.9 kg)    Diabetic Foot Exam - Simple   Simple Foot Form Diabetic Foot exam was performed with the following findings: Yes 09/04/2021  5:48 PM  Visual Inspection No deformities, no ulcerations, no other skin breakdown bilaterally:  Yes Sensation Testing Intact to touch and monofilament testing bilaterally: Yes Pulse Check Posterior Tibialis and Dorsalis pulse intact bilaterally: Yes Comments    Lab Results  Component Value Date   WBC 4.4 09/04/2021   HGB 14.0 09/04/2021   HCT 42.5 09/04/2021   PLT 248.0 09/04/2021   GLUCOSE 112 (H) 09/04/2021   CHOL 201 (H) 09/04/2021   TRIG 68.0 09/04/2021   HDL 119.20 09/04/2021   LDLDIRECT 95.9 10/06/2012   LDLCALC 68 09/04/2021   ALT 19 09/04/2021   AST 16 09/04/2021   NA 140 09/04/2021   K 4.1 09/04/2021   CL 103 09/04/2021   CREATININE 0.70 09/04/2021   BUN 16 09/04/2021   CO2 28 09/04/2021   TSH 1.250 05/27/2017   INR 2.0 (H) 01/29/2019   HGBA1C 7.0 (H) 09/04/2021   MICROALBUR 1.1 09/04/2021    Lab Results  Component Value Date   TSH 1.250 05/27/2017   Lab  Results  Component Value Date   WBC 4.4 09/04/2021   HGB 14.0 09/04/2021   HCT 42.5 09/04/2021   MCV 98.6 09/04/2021   PLT 248.0 09/04/2021   Lab Results  Component Value Date   NA 140 09/04/2021   K 4.1 09/04/2021   CO2 28 09/04/2021   GLUCOSE 112 (H) 09/04/2021   BUN 16 09/04/2021   CREATININE 0.70 09/04/2021   BILITOT 1.0 09/04/2021   ALKPHOS 79 09/04/2021   AST 16 09/04/2021   ALT 19 09/04/2021   PROT 6.3 09/04/2021   ALBUMIN 4.1 09/04/2021   CALCIUM 9.9 09/04/2021   ANIONGAP 11 01/31/2019   GFR 81.94 09/04/2021   Lab Results  Component Value Date   CHOL 201 (H) 09/04/2021   Lab Results  Component Value Date   HDL 119.20 09/04/2021   Lab Results  Component Value Date   LDLCALC 68 09/04/2021   Lab Results  Component Value Date   TRIG 68.0 09/04/2021   Lab Results  Component Value Date   CHOLHDL 2 09/04/2021   Lab Results  Component Value Date   HGBA1C 7.0 (H) 09/04/2021       Assessment & Plan:   Problem List Items Addressed This Visit       Unprioritized   Hyperlipidemia LDL goal <70    Encourage heart healthy diet such as MIND or DASH diet, increase exercise, avoid trans fats, simple carbohydrates and processed foods, consider a krill or fish or flaxseed oil cap daily.       Hyperlipidemia associated with type 2 diabetes mellitus ([place])    Tolerating statin, encouraged heart healthy diet, avoid trans fats, minimize simple carbs and saturated fats. Increase exercise as tolerated      Relevant Medications   Semaglutide, 1 MG/DOSE, 4 MG/3ML SOPN   Other Relevant Orders   CBC with Differential/Platelet (Completed)   Comprehensive metabolic panel (Completed)   Hemoglobin A1c (Completed)   Lipid panel (Completed)   Microalbumin / creatinine urine ratio (Completed)   Chronic diastolic heart failure ([place])    Per cardiology  con't meds       Other Visit Diagnoses     Uncontrolled type 2 diabetes mellitus with hyperglycemia ([place])    -   Primary   Relevant Medications   Semaglutide, 1 MG/DOSE, 4 MG/3ML SOPN   Other Relevant Orders   CBC with Differential/Platelet (Completed)   Comprehensive metabolic panel (Completed)   Hemoglobin A1c (Completed)   Lipid panel (Completed)   Microalbumin / creatinine urine ratio (Completed)   Primary hypertension  Relevant Orders   CBC with Differential/Platelet (Completed)   Comprehensive metabolic panel (Completed)   Hemoglobin A1c (Completed)   Lipid panel (Completed)   Microalbumin / creatinine urine ratio (Completed)   Bilateral hip pain       Relevant Orders   DG HIPS BILAT W OR W/O PELVIS 2V       Meds ordered this encounter  Medications   Semaglutide, 1 MG/DOSE, 4 MG/3ML SOPN    Sig: Inject 1 mg as directed once a week.    Dispense:  3 mL    Refill:  3    I, Ann Held, DO, personally preformed the services described in this documentation.  All medical record entries made by the scribe were at my direction and in my presence.  I have reviewed the chart and discharge instructions (if applicable) and agree that the record reflects my personal performance and is accurate and complete. 09/04/2021   I,Amber Collins,acting as a scribe for [employer], DO.,have documented all relevant documentation on the behalf of Ann Held, DO,as directed by  Ann Held, DO while in the presence of Ann Held, DO.   Ann Held, DO

## 2021-09-07 DIAGNOSIS — H5203 Hypermetropia, bilateral: Secondary | ICD-10-CM | POA: Diagnosis not present

## 2021-09-07 DIAGNOSIS — E119 Type 2 diabetes mellitus without complications: Secondary | ICD-10-CM | POA: Diagnosis not present

## 2021-09-07 DIAGNOSIS — H524 Presbyopia: Secondary | ICD-10-CM | POA: Diagnosis not present

## 2021-09-07 DIAGNOSIS — H25813 Combined forms of age-related cataract, bilateral: Secondary | ICD-10-CM | POA: Diagnosis not present

## 2021-09-07 DIAGNOSIS — H52203 Unspecified astigmatism, bilateral: Secondary | ICD-10-CM | POA: Diagnosis not present

## 2021-09-11 ENCOUNTER — Ambulatory Visit (INDEPENDENT_AMBULATORY_CARE_PROVIDER_SITE_OTHER): Payer: Medicare PPO | Admitting: Pharmacist

## 2021-09-11 ENCOUNTER — Telehealth: Payer: Self-pay | Admitting: Family Medicine

## 2021-09-11 DIAGNOSIS — E1169 Type 2 diabetes mellitus with other specified complication: Secondary | ICD-10-CM

## 2021-09-11 DIAGNOSIS — E1165 Type 2 diabetes mellitus with hyperglycemia: Secondary | ICD-10-CM

## 2021-09-11 NOTE — Telephone Encounter (Signed)
Spoke with [facility]. They has missed linking patient's discount card for Ozempic to new strength of '1mg'$  weekly. Reprocessed and cost decreased from $200 to $50 for 28 day supply.  Patient notified.

## 2021-09-11 NOTE — Telephone Encounter (Signed)
Pt called stating that since her Ozempic dosage changed, it invalidated the grant that she had and she needed to look into getting a new one.

## 2021-09-11 NOTE — Chronic Care Management (AMB) (Signed)
Chronic Care Management Pharmacy Note  09/11/2021 Name:  Sierra Ford MRN:  [id number] DOB:  02-18-1941  Summary: Patient did not qualify for Ozempic patient assistance earlier in 2023 due to having GEHA coverage for medications. She is using manufacturer discount card to lower monthly cost of Ozempic from $200 to $50 per month. Patient recently changed from 0.242m to 1242mof Ozempic weekly and states cost increased. Coordinated with her pharmacy to transfer card to new strength of Ozempic. Now covered for $50 per month. Patient has not started new dose but advised to cal office if any issues with tolerance.  Hypertension: recent office blood pressure has been at goal. No changes recommended.  Hyperlipidemia; last LDL was at goals. Continue Praulent and rosuvastatin.   Subjective: Sierra Ford an [age].o. year old female who is a primary patient of Sierra HeldDO.  The CCM team was consulted for assistance with disease management and care coordination needs.    Collaboration with  patient and pharmacy   for  Chronic Care Management follow up  in response to provider referral for pharmacy case management and/or care coordination services.   Consent to Services:  The patient was given information about Chronic Care Management services, agreed to services, and gave verbal consent prior to initiation of services.  Please see initial visit note for detailed documentation.   Patient Care Team: Sierra HerterYvAlferd ApaDO as PCP - General HoMinus BreedingMD as PCP - Cardiology (Cardiology) NeMaisie FusMD as Consulting Physician (Obstetrics and Gynecology) EcCherre RobinsRPH-CPP (Pharmacist) AlRigoberto NoelMD as Consulting Physician (Pulmonary Disease)  Recent office visits: 09/04/2021 - PCP (Dr Sierra Ford. A1c 7.0% and BMI 35.81. Inreased dose of Ozempic to 42m6meekly.  03/07/2021 - PCP (Dr LowCarollee Herternnual Ford. No medication changes.  Labs checked.   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 Ordered lower extremity doppler. No evidience of DVT. No med changes noted.  04/24/2021 - Cardio (Dr HocJenkins Rouge/U PE unprovoked. Lowered dose of Eliquis to 2.5mg62mice a day. Scheduled PFTs. Recommended F/U with pulmonology. F/U 12 months with cardio.  04/20/2021 - Otho (Dr NorrVeverly Fellsft shoulder pain 04/06/2021 - Ortho (dr GiofLinden Dolinft shoulder pain 04/04/2021 - GI (Zehr, PAC)Fairleeen for lower abdoinal pain. Started hyddorcortisone suppository qhs for 7 nights. Recommended Benefiber 2 teaspoons in 8 ouces of liwuid every day. Ordered CT of abdomen.   Hospital visits: None in previous 6 months  Objective:  Lab Results  Component Value Date   CREATININE 0.70 09/04/2021   CREATININE 0.84 04/04/2021   CREATININE 0.77 03/07/2021    Lab Results  Component Value Date   HGBA1C 7.0 (H) 09/04/2021   Last diabetic Eye Ford:  Lab Results  Component Value Date/Time   HMDIABEYEEXA No Retinopathy 11/05/2019 12:00 AM    Last diabetic Foot Ford: No results found for: "HMDIABFOOTEX"      Component Value Date/Time   CHOL 201 (H) 09/04/2021 1102   CHOL 209 (H) 08/26/2018 1018   TRIG 68.0 09/04/2021 1102   HDL 119.20 09/04/2021 1102   HDL 101 08/26/2018 1018   CHOLHDL 2 09/04/2021 1102   VLDL 13.6 09/04/2021 1102   LDLCALC 68 09/04/2021 1102   LDLCALC 69 11/10/2019 1122   LDLDIRECT 95.9 10/06/2012 1248       Latest Ref Rng & Units 09/04/2021   11:02 AM  03/07/2021   11:06 AM 09/16/2020   11:58 AM  Hepatic Function  Total Protein 6.0 - 8.3 g/dL 6.3  6.8  6.7   Albumin 3.5 - 5.2 g/dL 4.1  4.2  4.0   AST 0 - 37 U/L '16  19  16   ' ALT 0 - 35 U/L '19  21  16   ' Alk Phosphatase 39 - 117 U/L 79  67  72   Total Bilirubin 0.2 - 1.2 mg/dL 1.0  1.0  0.8     Lab Results  Component Value Date/Time   TSH 1.250 05/27/2017 12:24 PM   TSH 1.27 12/28/2016 12:00 AM   TSH 1.00  01/03/2015 12:00 AM   TSH 1.61 08/07/2007 09:38 AM   FREET4 1.05 05/27/2017 12:24 PM       Latest Ref Rng & Units 09/04/2021   11:02 AM 03/07/2021   11:06 AM 01/31/2019    2:30 AM  CBC  WBC 4.0 - 10.5 K/uL 4.4  5.3  5.3   Hemoglobin 12.0 - 15.0 g/dL 14.0  14.1  12.0   Hematocrit 36.0 - 46.0 % 42.5  42.7  36.2   Platelets 150.0 - 400.0 K/uL 248.0  224.0  202     Lab Results  Component Value Date/Time   VD25OH 39.1 01/28/2018 09:24 AM   VD25OH 45.9 05/27/2017 12:24 PM    Clinical ASCVD: Yes  The ASCVD Risk score (Arnett DK, et al., 2019) failed to calculate for the following reasons:   The valid HDL cholesterol range is 20 to 100 mg/dL     Social History   Tobacco Use  Smoking Status Former   Packs/day: 1.00   Years: 50.00   Total pack years: 50.00   Types: Cigarettes   Quit date: 11/21/2009   Years since quitting: 11.8  Smokeless Tobacco Never   BP Readings from Last 3 Encounters:  09/04/21 128/78  08/04/21 122/82  06/23/21 126/66   Pulse Readings from Last 3 Encounters:  09/04/21 90  08/04/21 73  06/23/21 77   Wt Readings from Last 3 Encounters:  09/04/21 195 lb 12.8 oz (88.8 kg)  08/04/21 193 lb 9.6 oz (87.8 kg)  06/23/21 196 lb (88.9 kg)    Assessment: Review of patient past medical history, allergies, medications, health status, including review of consultants reports, laboratory and other test data, was performed as part of comprehensive evaluation and provision of chronic care management services.   SDOH:  (Social Determinants of Health) assessments and interventions performed:     CCM Care Plan  No Known Allergies  Medications Reviewed Today     Reviewed by Cherre Robins, RPH-CPP (Pharmacist) on 09/11/21 at 66  Med List Status: <None>   Medication Order Taking? Sig Documenting Provider Last Dose Status Informant  albuterol (VENTOLIN HFA) 108 (90 Base) MCG/ACT inhaler [id number] Yes Inhale 2 puffs into the lungs every 6 (six) hours as needed  for wheezing or shortness of breath. Rigoberto Noel, MD Taking Active   Alirocumab (PRALUENT) 75 MG/ML Darden Palmer [id number] Yes INJECT 1 PEN INTO THE SKIN EVERY 14 DAYS Hochrein, James, MD Taking Active   apixaban (ELIQUIS) 2.5 MG TABS tablet [id number] Yes Take 1 tablet (2.5 mg total) by mouth 2 (two) times daily. Minus Breeding, MD Taking Active   blood glucose meter kit and supplies KIT [id number] Yes Dispense based on patient and insurance preference. Use up to four times daily as directed. (FOR ICD-9 250.00, 250.01). Ann Held, DO Taking Active Self  CALCIUM-VITAMIN D PO [id number] Yes Take 600 mg by mouth daily. [provider] Taking Active Self  cholecalciferol (VITAMIN D3) 10 MCG (400 UNIT) TABS tablet [id number] Yes Take 400 Units by mouth daily. [provider] Taking Active Self  Coenzyme Q10 100 MG TABS [id number] Yes Take 100 mg by mouth daily. [provider] Taking Active Self  Flaxseed, Linseed, (FLAXSEED OIL PO) [id number] Yes Take 1,000 mg by mouth daily.  [provider] Taking Active Self  furosemide (LASIX) 40 MG tablet [id number] Yes TAKE 1 TABLET DAILY Minus Breeding, MD Taking Active            Med Note [facility], Jenene Slicker Feb 27, 2021  3:58 PM) Dewaine Conger as needed  hydrocortisone (ANUSOL-HC) 25 MG suppository [id number] Yes Place 1 suppository (25 mg total) rectally every 12 (twelve) hours. Roma Schanz R, DO Taking Active   Insulin Pen Needle (NOVOFINE PLUS PEN NEEDLE) 32G X 4 MM MISC [id number] Yes As directed Ann Held, DO Taking Active   Multiple Vitamin (MULTIVITAMIN) capsule [id number] Yes Take 1 capsule by mouth daily. [provider] Taking Active     Discontinued 05/07/11 1124 (Discontinued by provider) Omega-3 Fatty Acids (FISH OIL PO) [id number] Yes Take 1,000 mg by mouth daily. [provider] Taking Active Self  POTASSIUM CHLORIDE PO [id number] Yes Take 1 tablet by mouth as needed (muscle cramps).   [provider] Taking Active Self           Med Note Hali Marry, Clarise Cruz E   Tue Jan 27, 2019  4:18 PM) Pt states she takes when she takes furosemide to mitigate cramping    Discontinued 05/07/11 1124 (Change in therapy) rosuvastatin (CRESTOR) 10 MG tablet [id number] Yes Take 1 tablet (10 mg total) by mouth daily. Minus Breeding, MD Taking Active   Semaglutide, 1 MG/DOSE, 4 MG/3ML SOPN [id number] No Inject 1 mg as directed once a week.  Patient not taking: Reported on 09/11/2021   Ann Held, DO Not Taking Active   TRUE METRIX BLOOD GLUCOSE TEST test strip [id number] Yes TEST  UP  TO FOUR TIMES DAILY AS DIRECTED Ann Held, DO Taking Active Self  TRUEPLUS LANCETS 33G [place] [id number] Yes TEST  UP TO FOUR TIMES DAILY AS DIRECTED Ann Held, DO Taking Active Self            Patient Active Problem List   Diagnosis Date Noted   Lower abdominal pain 04/04/2021   Rectal bleeding 04/04/2021   Change in bowel habits 04/04/2021   Internal hemorrhoids 04/04/2021   Preventative health care 03/07/2021   Sacroiliitis ([place]) 03/07/2021   Morbid (severe) obesity due to excess calories ([place]) 03/07/2021   Bronchitis 12/20/2020   Other pulmonary embolism with acute cor pulmonale, unspecified chronicity ([place])    Educated about COVID-19 virus infection 01/26/2019   Hyperlipidemia associated with type 2 diabetes mellitus ([place]) 04/24/2018   Right ankle swelling 05/13/2017   Diabetes mellitus, type II ([place]) 04/08/2017   Essential hypertension 04/08/2017   Atherosclerosis of aorta ([place]) 04/08/2017   Abnormal auditory perception of both ears 10/01/2016   Bilateral impacted cerumen 10/01/2016   OSA (obstructive sleep apnea) 12/16/2015   DOE (dyspnea on exertion) 06/12/2013   Obesity (BMI 30-39.9) 04/24/2013   Chest pain, unspecified 01/22/2011   Abnormal stress test 01/22/2011   Frequent urination 01/22/2011   Chronic diastolic heart failure ([place]) 05/15/2009   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   RASH-NONVESICULAR  04/20/2009   CERVICAL STRAIN, WITH RADICULOPATHY 03/31/2009   CAROTID ARTERY DISEASE 01/12/2009   CERVICAL STRAIN 01/11/2009   LEG EDEMA, RIGHT 07/27/2008   HX, URINARY INFECTION 09/19/2006   Hyperlipidemia LDL goal <70 08/20/2006   PREGNANCY, ECTOPIC NEC W/INTRAUTERINE PRG 08/20/2006   FREQUENCY, URINARY 08/20/2006   Other specified abnormal findings of blood chemistry 08/20/2006   Personal history of venous thrombosis and embolism 08/20/2006    Immunization History  Administered Date(s) Administered   Fluad Quad(high Dose 65+) 12/04/2019, 11/03/2020, 12/29/2020   Influenza, High Dose Seasonal PF 12/22/2018   PFIZER Comirnaty(Gray Top)Covid-19 Tri-Sucrose Vaccine 06/08/2020   PFIZER(Purple Top)SARS-COV-2 Vaccination 03/06/2019, 03/27/2019, 11/10/2019   Pfizer Covid-19 Vaccine Bivalent Booster 22yr & up 12/06/2020   Pneumococcal Conjugate-13 04/24/2013   Pneumococcal Polysaccharide-23 08/07/2007, 12/04/2019   Td 03/03/2003   Tdap 11/06/2018   Zoster Recombinat (Shingrix) 11/06/2018, 05/11/2019   Zoster, Live 08/14/2007, 11/13/2007    Ford to be addressed/monitored: CHF, CAD, HTN, HLD, DMII, and history of pulmonary embolism  Care Plan : General Pharmacy (Adult)  Updates made by ECherre Robins RPH-CPP since 09/11/2021 12:00 AM     Problem: type 2 DM; Hyperlipidemia; CHF: h/o PE; obesity; OSA      Long-Range Goal: Long Range pharmacy goals for chronic Ford and medication management   Start Date: 08/17/2020  This Visit's Progress: On track  Recent Progress: On track  Priority: High  Note:   Current Barriers:  Unable to independently afford treatment regimen   Pharmacist Clinical Goal(s):  Over the next 180 days, patient will verbalize ability to afford treatment regimen maintain control of DM, BP and cholesterol as evidenced by maintaining goals listed below  adhere to prescribed medication regimen  as evidenced by fill hisory  through collaboration with PharmD and provider.   Interventions: 1:1 collaboration with LCarollee Ford YAlferd Apa DO regarding development and update of comprehensive plan of care as evidenced by provider attestation and co-signature Inter-disciplinary care team collaboration (see longitudinal plan of care) Comprehensive medication review performed; medication list updated in electronic medical record   Hypertension Screening Controlled;  BP goal <130/80 Does not currently check blood pressure at home Current regimen:  Diet and exercise management   Interventions: Discussed BP goal  Hyperlipidemia Controlled; LDL goal < 70 Current regimen:  Rosuvastatin 166mdaily Praluent 7554mvery 14 days  Flaxseed Oil 1000m62mily CoEnzyme Q10 daily  Patient receives assistance with Praluent cost from He[employer] La[employer]day she reports that she has received notification that she was approved for 2023.  Interventions: Discussed LDL goal Maintain cholesterol medication regimen.  Notify clinical pharmacist if any future issues regarding cost of Praulent or [employer]  Diabetes Controlled;  A1c goal <7% Current regimen:  Ozempic 1mg 47mcutaneously once per week (dose increased 09/04/2021) Tolerating Ozempic without nausea; She did have some abdominal pain and constipation recently and was seen by GI. She started Benefiber and increased dietary fiber and abdominal pain has resolved and stools more consistent.  Received OzempPilgrim's Prideent assistance in 2022. Completed application for patient assistance program. Patient has [employer] Medicare part B and A government employee plan for prescriptions but verified that cost of Ozempic would be $200 on he GEHA plan. Patient is using manufacturer discount card and cost is lowered to $50 per month. Exercise: doing either water aerobics or treadmill 5 days per week Diet: limiting serving sizes (easier with  Ozempic) and sugar intake. She has also decreased intake of red meat.  Interventions: Discussed A1c  goal Reviewed home blood glucose goals  Fasting blood glucose goal (before meals) = 80 to 130 Blood glucose goal after a meal = less than 180 : Restart checking blood sugar 2 ot 3 times per week, document, and provide at future appointments Coordinated with pharmacy to use Ozempic discount card.   Osteoporosis Screening / Bone Health Goal: reduce risk of fracture due to osteopenia/osteoporosis Current regimen:  Calcium/Vitamin 628m/800 units twice daily  Vitamin D 4062mtwice daily Recent serum calcium elevated at 10.9 Interventions: Recommend lowering dose of calcium due to recent elevated serum calcium Decrease calcium supplement to take just 1 tablet daily  Continue current vitamin D supplementation   Medication management [facility] Patient is taking Eliquis for history of PE. She has been receiving from patient assistance recently. She is working with cardiology office on this Interventions Comprehensive medication review performed. Continue current medication management strategy  Patient Goals/Self-Care Activities Over the next 180 days, patient will:  take medications as prescribed,  check glucose daily , document, and provide at future appointment Reviewed home blood glucose goals  Fasting blood glucose goal (before meals) = 80 to 130 Blood glucose goal after a meal = less than 180 : Restart checking blood sugar 2 ot 3 times per week, document, and provide at future appointments Engage in dietary modifications by limiting carbohydrate serving sizes;  Continue to get at least 150 minutes of exercise weekly  Follow Up Plan: Telephone follow up appointment with care management team member scheduled for:  1 to 2 months        Medication Assistance:  Received Eliquis and Ozempic through patient assistance program in 2022.  Praluent  through [employer]ppproved for patient assistance program thru 06/03/2021. Last shipment received 04/26/2021. Received new application for patient assistance program for 2023 from patient. Completed provider portion today and forwarded to PCP to review and sign.   Eliquis [id number] initially denied. Patient is contacting cardiology office since they sent in for her.  Approved per patient for HeMcDonald's Corporationor Pr[employer] Sciences Corporationor 2023.   [facility] [address] [id number]: [phone]: [id number] [place] [place]: [phone]: [phone])[place])[place] [id number]: [phone]: [id number]: [phone]: [address] [place] [address]one: [phone]: [phone] Follow Up:  Patient agrees to Care Plan and Follow-up.  Plan: Telephone follow up appointment with care management team member scheduled for:  1 to 2 months.   TamCherre RobinsharmD Clinical Pharmacist [phone]

## 2021-09-11 NOTE — Patient Instructions (Signed)
Mrs. Raybourn It was a pleasure speaking with you  Below is a summary of your health goals and care plan  Patient Goals/Self-Care Activities take medications as prescribed,  Contact clinical pharmacist with any issues with cost of medications or questions about medications.  check glucose daily , document, and provide at future appointment Reviewed home blood glucose goals  Fasting blood glucose goal (before meals) = 80 to 130 Blood glucose goal after a meal = less than 180 : Restart checking blood sugar 2 ot 3 times per week, document, and provide at future appointments Engage in dietary modifications by limiting carbohydrate serving sizes;  Continue to get at least 150 minutes of exercise weekly   If you have any questions or concerns, please feel free to contact me either at the phone number below or with a MyChart message.   Keep up the good work!  Cherre Robins, PharmD Clinical Pharmacist [place] [phone] (direct line)  [phone] (main office number)   Patient verbalizes understanding of instructions and care plan provided today and agrees to view in [place]. Active MyChart status and patient understanding of how to access instructions and care plan via MyChart confirmed with patient.

## 2021-09-18 DIAGNOSIS — E785 Hyperlipidemia, unspecified: Secondary | ICD-10-CM | POA: Diagnosis not present

## 2021-09-18 DIAGNOSIS — E1169 Type 2 diabetes mellitus with other specified complication: Secondary | ICD-10-CM | POA: Diagnosis not present

## 2021-09-18 DIAGNOSIS — E1165 Type 2 diabetes mellitus with hyperglycemia: Secondary | ICD-10-CM

## 2021-09-25 ENCOUNTER — Ambulatory Visit (INDEPENDENT_AMBULATORY_CARE_PROVIDER_SITE_OTHER): Payer: Medicare PPO | Admitting: Pharmacist

## 2021-09-25 DIAGNOSIS — E1165 Type 2 diabetes mellitus with hyperglycemia: Secondary | ICD-10-CM

## 2021-09-25 DIAGNOSIS — E1169 Type 2 diabetes mellitus with other specified complication: Secondary | ICD-10-CM

## 2021-09-25 DIAGNOSIS — I10 Essential (primary) hypertension: Secondary | ICD-10-CM

## 2021-09-25 NOTE — Patient Instructions (Signed)
Sierra Ford It was a pleasure speaking with you today.  Below is a summary of your health goals and summary of our recent visit. You can also view your updated Chronic Care Management Care plan through your MyChart account.    Patient Goals/Self-Care Activities take medications as prescribed,  check glucose daily , document, and provide at future appointment Reviewed home blood glucose goals  Fasting blood glucose goal (before meals) = 80 to 130 Blood glucose goal after a meal = less than 180 : Restart checking blood sugar 2 ot 3 times per week, document, and provide at future appointments Engage in dietary modifications by limiting carbohydrate serving sizes;  Continue to get at least 150 minutes of exercise weekly Contact ortho office  / Dr Gladstone Lighter to discus potential options. If they are unable to see you soon or before your trip,  call back to our office for evaluation by PCP.   As always if you have any questions or concerns especially regarding medications, please feel free to contact me either at the phone number below or with a MyChart message.   Keep up the good work!  Cherre Robins, PharmD Clinical Pharmacist [place] [phone] (direct line)  [phone] (main office number)   Patient verbalizes understanding of instructions and care plan provided today and agrees to view in [place]. Active MyChart status and patient understanding of how to access instructions and care plan via MyChart confirmed with patient.

## 2021-09-25 NOTE — Chronic Care Management (AMB) (Signed)
Chronic Care Management Pharmacy Note  09/25/2021 Name:  GLORIOUS FLICKER MRN:  [id number] DOB:  [birth year]  Summary: Patient did not qualify for Ozempic patient assistance earlier in 2023 (see scanned letter 07/11/2021 from [employer] patient assistance program)  due to having GEHA coverage for medications. She is using manufacturer discount card to lower monthly cost of Ozempic from $200 to $50 per month. She might qualify for newly opened funds for type 2 diabetes with [employer]. Provided number for patient to call since this fund is requiring only phone applications. Phone: [phone].  Patient recently changed from 0.5m to 192mof Ozempic weekly however she has not started 52m55mzempic yet. She report home blood glucose this morning was 121 and has been 115 to 140 over the last month.  Hypertension: recent office blood pressure was at goal. No changes recommended.  Hyperlipidemia; last LDL was at goals. Continue Praulent and rosuvastatin.  Shoulder pain: patient reports shoulder pain has been bothersome recently. She is going out of town 10/07/2021 and would like relief during her trip. She had steroid injection last 08/04/2021 but report relief only lasted about 2 weeks. Recommended she contact ortho office first to discus potential options. If they are unable to see her soon or before her trip she is instructed to call back to our office for evaluation by PCP.   Subjective: BetCHIANN GOFFREDO an [age] year old female who is a primary patient of LowAnn HeldO.  The CCM team was consulted for assistance with disease management and care coordination needs.    Collaboration with  patient   for  Chronic Care Management follow up  in response to provider referral for pharmacy case management and/or care coordination services.   Consent to Services:  The patient was given information about Chronic Care Management services, agreed to services, and gave verbal  consent prior to initiation of services.  Please see initial visit note for detailed documentation.   Patient Care Team: LowCarollee HertervoAlferd ApaO as PCP - General HocMinus BreedingD as PCP - Cardiology (Cardiology) NeaMaisie FusD (Inactive) as Consulting Physician (Obstetrics and Gynecology) EckCherre RobinsPH-CPP (Pharmacist) AlvRigoberto NoelD as Consulting Physician (Pulmonary Disease)  Recent office visits: 09/04/2021 - PCP (Dr LowCarollee Herterollow up chronic conditions. A1c 7.0% and BMI 35.81. Inreased dose of Ozempic to 52mg67mekly.   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 Ordered lower extremity doppler. No evidience of DVT. No med changes noted.  04/24/2021 - Cardio (Dr HochJenkins RougeU PE unprovoked. Lowered dose of Eliquis to 2.5mg 43mce a day. Scheduled PFTs. Recommended F/U with pulmonology. F/U 12 months with cardio.  04/20/2021 - Otho (Dr NorriVeverly Fellst shoulder pain 04/06/2021 - Ortho (Dr GioffGladstone Lightert shoulder pain 04/04/2021 - GI (Zehr, PAC) [place] for lower abdoinal pain. Started hyddorcortisone suppository qhs for 7 nights. Recommended Benefiber 2 teaspoons in 8 ouces of liwuid every day. Ordered CT of abdomen.   Hospital visits: None in previous 6 months  Objective:  Lab Results  Component Value Date   CREATININE 0.70 09/04/2021   CREATININE 0.84 04/04/2021   CREATININE 0.77 03/07/2021    Lab Results  Component Value Date   HGBA1C 7.0 (H) 09/04/2021   Last diabetic Eye exam:  Lab Results  Component Value Date/Time   HMDIABEYEEXA No Retinopathy  11/05/2019 12:00 AM    Last diabetic Foot exam: No results found for: "HMDIABFOOTEX"      Component Value Date/Time   CHOL 201 (H) 09/04/2021 1102   CHOL 209 (H) 08/26/2018 1018   TRIG 68.0 09/04/2021 1102   HDL 119.20 09/04/2021 1102   HDL 101 08/26/2018 1018   CHOLHDL 2  09/04/2021 1102   VLDL 13.6 09/04/2021 1102   LDLCALC 68 09/04/2021 1102   LDLCALC 69 11/10/2019 1122   LDLDIRECT 95.9 10/06/2012 1248       Latest Ref Rng & Units 09/04/2021   11:02 AM 03/07/2021   11:06 AM 09/16/2020   11:58 AM  Hepatic Function  Total Protein 6.0 - 8.3 g/dL 6.3  6.8  6.7   Albumin 3.5 - 5.2 g/dL 4.1  4.2  4.0   AST 0 - 37 U/L '16  19  16   ' ALT 0 - 35 U/L '19  21  16   ' Alk Phosphatase 39 - 117 U/L 79  67  72   Total Bilirubin 0.2 - 1.2 mg/dL 1.0  1.0  0.8     Lab Results  Component Value Date/Time   TSH 1.250 05/27/2017 12:24 PM   TSH 1.27 12/28/2016 12:00 AM   TSH 1.00 01/03/2015 12:00 AM   TSH 1.61 08/07/2007 09:38 AM   FREET4 1.05 05/27/2017 12:24 PM       Latest Ref Rng & Units 09/04/2021   11:02 AM 03/07/2021   11:06 AM 01/31/2019    2:30 AM  CBC  WBC 4.0 - 10.5 K/uL 4.4  5.3  5.3   Hemoglobin 12.0 - 15.0 g/dL 14.0  14.1  12.0   Hematocrit 36.0 - 46.0 % 42.5  42.7  36.2   Platelets 150.0 - 400.0 K/uL 248.0  224.0  202     Lab Results  Component Value Date/Time   VD25OH 39.1 01/28/2018 09:24 AM   VD25OH 45.9 05/27/2017 12:24 PM    Clinical ASCVD: Yes  The ASCVD Risk score (Arnett DK, et al., 2019) failed to calculate for the following reasons:   The 2019 ASCVD risk score is only valid for ages 67 to 26     Social History   Tobacco Use  Smoking Status Former   Packs/day: 1.00   Years: 50.00   Total pack years: 50.00   Types: Cigarettes   Quit date: 11/21/2009   Years since quitting: 11.8  Smokeless Tobacco Never   BP Readings from Last 3 Encounters:  09/04/21 128/78  08/04/21 122/82  06/23/21 126/66   Pulse Readings from Last 3 Encounters:  09/04/21 90  08/04/21 73  06/23/21 77   Wt Readings from Last 3 Encounters:  09/04/21 195 lb 12.8 oz (88.8 kg)  08/04/21 193 lb 9.6 oz (87.8 kg)  06/23/21 196 lb (88.9 kg)    Assessment: Review of patient past medical history, allergies, medications, health status, including review of  consultants reports, laboratory and other test data, was performed as part of comprehensive evaluation and provision of chronic care management services.   SDOH:  (Social Determinants of Health) assessments and interventions performed:     CCM Care Plan  No Known Allergies  Medications Reviewed Today     Reviewed by Cherre Robins, RPH-CPP (Pharmacist) on 09/25/21 at [place] List Status: <None>   Medication Order Taking? Sig Documenting Provider Last Dose Status Informant  albuterol (VENTOLIN HFA) 108 (90 Base) MCG/ACT inhaler [id number] Yes Inhale 2 puffs into the lungs every 6 (six)  hours as needed for wheezing or shortness of breath. Rigoberto Noel, MD Taking Active   Alirocumab (PRALUENT) 75 MG/ML Darden Palmer [id number] Yes INJECT 1 PEN INTO THE SKIN EVERY 14 DAYS Hochrein, James, MD Taking Active   apixaban (ELIQUIS) 2.5 MG TABS tablet [id number] Yes Take 1 tablet (2.5 mg total) by mouth 2 (two) times daily. Minus Breeding, MD Taking Active   blood glucose meter kit and supplies KIT [id number] Yes Dispense based on patient and insurance preference. Use up to four times daily as directed. (FOR ICD-9 250.00, 250.01). Ann Held, DO Taking Active Self  CALCIUM-VITAMIN D PO [id number] Yes Take 600 mg by mouth daily. [provider] Taking Active Self  cholecalciferol (VITAMIN D3) 10 MCG (400 UNIT) TABS tablet [id number] Yes Take 400 Units by mouth daily. [provider] Taking Active Self  Coenzyme Q10 100 MG TABS [id number] Yes Take 100 mg by mouth daily. [provider] Taking Active Self  Flaxseed, Linseed, (FLAXSEED OIL PO) [id number] Yes Take 1,000 mg by mouth daily.  [provider] Taking Active Self  furosemide (LASIX) 40 MG tablet [id number] Yes TAKE 1 TABLET DAILY Minus Breeding, MD Taking Active            Med Note [facility], Jenene Slicker Feb 27, 2021  3:58 PM) Dewaine Conger as needed  hydrocortisone (ANUSOL-HC) 25 MG suppository [id number] Yes Place 1  suppository (25 mg total) rectally every 12 (twelve) hours. Roma Schanz R, DO Taking Active   Insulin Pen Needle (NOVOFINE PLUS PEN NEEDLE) 32G X 4 MM MISC [id number] Yes As directed Ann Held, DO Taking Active   Multiple Vitamin (MULTIVITAMIN) capsule [id number] Yes Take 1 capsule by mouth daily. [provider] Taking Active     Discontinued 05/07/11 1124 (Discontinued by provider) Omega-3 Fatty Acids (FISH OIL PO) [id number] Yes Take 1,000 mg by mouth daily. [provider] Taking Active Self  POTASSIUM CHLORIDE PO [id number] Yes Take 1 tablet by mouth as needed (muscle cramps).  [provider] Taking Active Self           Med Note Hali Marry, Clarise Cruz E   Tue Jan 27, 2019  4:18 PM) Pt states she takes when she takes furosemide to mitigate cramping    Discontinued 05/07/11 1124 (Change in therapy) rosuvastatin (CRESTOR) 10 MG tablet [id number] Yes Take 1 tablet (10 mg total) by mouth daily. Minus Breeding, MD Taking Active   Semaglutide, 1 MG/DOSE, 4 MG/3ML SOPN [id number] Yes Inject 1 mg as directed once a week. Carollee Herter, Kendrick Fries R, [place] Taking Active   TRUE METRIX BLOOD GLUCOSE TEST test strip [id number] Yes TEST  UP  TO FOUR TIMES DAILY AS DIRECTED Ann Held, DO Taking Active Self  TRUEPLUS LANCETS 33G [place] [id number] Yes TEST  UP TO FOUR TIMES DAILY AS DIRECTED Ann Held, DO Taking Active Self            Patient Active Problem List   Diagnosis Date Noted   Lower abdominal pain 04/04/2021   Rectal bleeding 04/04/2021   Change in bowel habits 04/04/2021   Internal hemorrhoids 04/04/2021   Preventative health care 03/07/2021   Sacroiliitis ([place]) 03/07/2021   Morbid (severe) obesity due to excess calories ([place]) 03/07/2021   Bronchitis 12/20/2020   Other pulmonary embolism with acute cor pulmonale, unspecified chronicity ([place])    Educated about COVID-19 virus infection 01/26/2019   Hyperlipidemia associated with type 2 diabetes  mellitus ([place]) 04/24/2018  Right ankle swelling 05/13/2017   Diabetes mellitus, type II ([place]) 04/08/2017   Essential hypertension 04/08/2017   Atherosclerosis of aorta ([place]) 04/08/2017   Abnormal auditory perception of both ears 10/01/2016   Bilateral impacted cerumen 10/01/2016   OSA (obstructive sleep apnea) 12/16/2015   DOE (dyspnea on exertion) 06/12/2013   Obesity (BMI 30-39.9) 04/24/2013   Chest pain, unspecified 01/22/2011   Abnormal stress test 01/22/2011   Frequent urination 01/22/2011   Chronic diastolic heart failure ([place]) 05/15/2009   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   RASH-NONVESICULAR 04/20/2009   CERVICAL STRAIN, WITH RADICULOPATHY 03/31/2009   CAROTID ARTERY DISEASE 01/12/2009   CERVICAL STRAIN 01/11/2009   LEG EDEMA, RIGHT 07/27/2008   HX, URINARY INFECTION 09/19/2006   Hyperlipidemia LDL goal <70 08/20/2006   PREGNANCY, ECTOPIC NEC W/INTRAUTERINE PRG 08/20/2006   FREQUENCY, URINARY 08/20/2006   Other specified abnormal findings of blood chemistry 08/20/2006   Personal history of venous thrombosis and embolism 08/20/2006    Immunization History  Administered Date(s) Administered   Fluad Quad(high Dose 65+) 12/04/2019, 11/03/2020, 12/29/2020   Influenza, High Dose Seasonal PF 12/22/2018   PFIZER Comirnaty(Gray Top)Covid-19 Tri-Sucrose Vaccine 06/08/2020   PFIZER(Purple Top)SARS-COV-2 Vaccination 03/06/2019, 03/27/2019, 11/10/2019   Pfizer Covid-19 Vaccine Bivalent Booster 25yr & up 12/06/2020   Pneumococcal Conjugate-13 04/24/2013   Pneumococcal Polysaccharide-23 08/07/2007, 12/04/2019   Td 03/03/2003   Tdap 11/06/2018   Zoster Recombinat (Shingrix) 11/06/2018, 05/11/2019   Zoster, Live 08/14/2007, 11/13/2007    Conditions to be addressed/monitored: CHF, CAD, HTN, HLD, DMII, and history of pulmonary embolism  Care Plan : General Pharmacy (Adult)  Updates made by ECherre Robins RPH-CPP since 09/25/2021 12:00 AM     Problem: type 2 DM;  Hyperlipidemia; CHF: h/o PE; obesity; OSA      Long-Range Goal: Long Range pharmacy goals for chronic conditions and medication management   Start Date: 08/17/2020  Recent Progress: On track  Priority: High  Note:   Current Barriers:  Unable to independently afford treatment regimen   Pharmacist Clinical Goal(s):  Over the next 180 days, patient will verbalize ability to afford treatment regimen maintain control of DM, BP and cholesterol as evidenced by maintaining goals listed below  adhere to prescribed medication regimen as evidenced by fill hisory  through collaboration with PharmD and provider.   Interventions: 1:1 collaboration with LCarollee Herter YAlferd Apa DO regarding development and update of comprehensive plan of care as evidenced by provider attestation and co-signature Inter-disciplinary care team collaboration (see longitudinal plan of care) Comprehensive medication review performed; medication list updated in electronic medical record   Hypertension Screening Controlled;  BP goal <130/80 Does not currently check blood pressure at home Current regimen:  Diet and exercise management   Interventions: Discussed BP goal  Hyperlipidemia Controlled; LDL goal < 70 Current regimen:  Rosuvastatin 122mdaily Praluent 7543mvery 14 days  Flaxseed Oil 1000m72mily CoEnzyme Q10 daily  Patient receives assistance with Praluent cost from [employer] she reports that she has received notification that she was approved for 2023.  Interventions: Discussed LDL goal Maintain cholesterol medication regimen.  Notify clinical pharmacist if any future issues regarding cost of Praulent or [employer]  Diabetes Controlled;  A1c goal <7% Current regimen:  Ozempic 1mg 47mcutaneously once per week (dose increased 09/04/2021 - but has not started higher dose yet) Tolerating Ozempic without nausea; She did have some abdominal pain and constipation recently and was seen  by GI. She started Benefiber and increased dietary fiber and abdominal pain  has resolved and stools more consistent.  Completed application for patient assistance program in 2023 but was denied due to patient having GEHA coverage. Patient has [employer] Medicare part B and A government employee plan for prescriptions but verified that cost of Ozempic would be $200 on he GEHA plan. Patient is using manufacturer discount card and cost is lowered to $50 per month. Exercise: doing either water aerobics or treadmill 5 days per week Diet: limiting serving sizes (easier with Ozempic) and sugar intake. She has also decreased intake of red meat.  Interventions: Discussed A1c  goal Reviewed home blood glucose goals  Fasting blood glucose goal (before meals) = 80 to 130 Blood glucose goal after a meal = less than 180 : Restart checking blood sugar 2 ot 3 times per week, document, and provide at future appointments Coordinated with pharmacy to use Ozempic discount card.   Osteoporosis Screening / Bone Health Goal: reduce risk of fracture due to osteopenia/osteoporosis Current regimen:  Calcium/Vitamin 691m/800 units twice daily  Vitamin D 4012mtwice daily Recent serum calcium elevated at 10.9 Interventions: Recommend lowering dose of calcium due to recent elevated serum calcium Decrease calcium supplement to take just 1 tablet daily  Continue current vitamin D supplementation   Medication management [facility] Patient is taking Eliquis for history of PE. She has been receiving from patient assistance recently. She is working with cardiology office on this Interventions Comprehensive medication review performed. Continue current medication management strategy  Patient Goals/Self-Care Activities Over the next 180 days, patient will:  take medications as prescribed,  check glucose daily , document, and provide at future appointment Reviewed home blood glucose goals   Fasting blood glucose goal (before meals) = 80 to 130 Blood glucose goal after a meal = less than 180 : Restart checking blood sugar 2 ot 3 times per week, document, and provide at future appointments Engage in dietary modifications by limiting carbohydrate serving sizes;  Continue to get at least 150 minutes of exercise weekly  Follow Up Plan: Telephone follow up appointment with care management team member scheduled for:  1 to 2 months         Medication Assistance:  Received Eliquis and Ozempic through patient assistance program in 2022.  Praluent through [employer] for patient assistance program thru 06/03/2021. Last shipment received 04/26/2021. Received new application for patient assistance program for 2023 from patient. Completed provider portion today and forwarded to PCP to review and sign.   Eliquis [id number] initially denied. Patient is contacting cardiology office since they sent in for her.  Approved per patient for [employer] 2023.   [facility] [address] [id number]: [phone]: [phone] #[id number] GaryC [place] [place]4665one: [phone]: [phone])[place] GREMorningsideE)[place] ParkC [id number]: [phone]: [address]SMarc[address]0[place] [place]y456389-3734one: [phone]: [address]2035one: [phone]:  [phone]    Follow Up:  Patient agrees to Care Plan and Follow-up.  Plan: Telephone follow up appointment with care management team member scheduled for:  1 to 2 months.   Cherre Robins, PharmD Clinical Pharmacist [facility] [phone]

## 2021-10-04 DIAGNOSIS — M7062 Trochanteric bursitis, left hip: Secondary | ICD-10-CM | POA: Diagnosis not present

## 2021-10-04 DIAGNOSIS — M25512 Pain in left shoulder: Secondary | ICD-10-CM | POA: Diagnosis not present

## 2021-10-05 ENCOUNTER — Ambulatory Visit
Admission: RE | Admit: 2021-10-05 | Discharge: 2021-10-05 | Disposition: A | Discharge status: Home / Self Care | Payer: Medicare PPO | Source: Ambulatory Visit | Attending: Internal Medicine | Admitting: Internal Medicine

## 2021-10-05 VITALS — BP 149/81 | HR 98 | Temp 97.8°F | Resp 18

## 2021-10-05 DIAGNOSIS — Z20822 Contact with and (suspected) exposure to covid-19: Secondary | ICD-10-CM

## 2021-10-05 LAB — SARS CORONAVIRUS 2 BY RT PCR: SARS Coronavirus 2 by RT PCR: NEGATIVE

## 2021-10-05 NOTE — ED Triage Notes (Signed)
[facility] to [facility] with need for covid test to go on cruise .

## 2021-10-05 NOTE — Discharge Instructions (Signed)
Covid test is pending.  We will call only if it is positive.

## 2021-10-05 NOTE — ED Provider Notes (Signed)
[facility]    CSN: [id number] Arrival date & time: 10/05/21  1506      History   Chief Complaint Chief Complaint  Patient presents with   Cough    covid test - Entered by patient    HPI Sierra Ford is a 81 y.o. female.   Patient presents for COVID testing for vacation.  Patient denies any symptoms or exposure.  Patient reports that she simply needs a COVID test to go on a cruise.   Cough   Past Medical History:  Diagnosis Date   Anxiety    Back pain    Chest pain    CLite with apical ischemia in 2006 - normal coronary arteries by [facility] in 12/2004;  Myoview 11/12:  Low risk stress nuclear study with a small, partially reversible apical defect most likely related to apical thinning; cannot R/O very mild apical ischemia.  EF: 75%    Decreased hearing    Depression    Diabetes ([place])    DVT (deep venous thrombosis) ([place]) 2006   hx of   Ectopic pregnancy with intrauterine pregnancy    Hiatal hernia    Hyperlipidemia    PE (pulmonary embolism) 2006   hx of   Sleep apnea    CPAP     Patient Active Problem List   Diagnosis Date Noted   Lower abdominal pain 04/04/2021   Rectal bleeding 04/04/2021   Change in bowel habits 04/04/2021   Internal hemorrhoids 04/04/2021   Preventative health care 03/07/2021   Sacroiliitis ([place]) 03/07/2021   Morbid (severe) obesity due to excess calories ([place]) 03/07/2021   Bronchitis 12/20/2020   Other pulmonary embolism with acute cor pulmonale, unspecified chronicity ([place])    Educated about COVID-19 virus infection 01/26/2019   Hyperlipidemia associated with type 2 diabetes mellitus ([place]) 04/24/2018   Right ankle swelling 05/13/2017   Diabetes mellitus, type II ([place]) 04/08/2017   Essential hypertension 04/08/2017   Atherosclerosis of aorta ([place]) 04/08/2017   Abnormal auditory perception of both ears 10/01/2016   Bilateral impacted cerumen 10/01/2016   OSA (obstructive sleep apnea) 12/16/2015   DOE (dyspnea on  exertion) 06/12/2013   Obesity (BMI 30-39.9) 04/24/2013   Chest pain, unspecified 01/22/2011   Abnormal stress test 01/22/2011   Frequent urination 01/22/2011   Chronic diastolic heart failure ([place]) 05/15/2009   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   RASH-NONVESICULAR 04/20/2009   CERVICAL STRAIN, WITH RADICULOPATHY 03/31/2009   CAROTID ARTERY DISEASE 01/12/2009   CERVICAL STRAIN 01/11/2009   LEG EDEMA, RIGHT 07/27/2008   HX, URINARY INFECTION 09/19/2006   Hyperlipidemia LDL goal <70 08/20/2006   PREGNANCY, ECTOPIC NEC W/INTRAUTERINE PRG 08/20/2006   FREQUENCY, URINARY 08/20/2006   Other specified abnormal findings of blood chemistry 08/20/2006   Personal history of venous thrombosis and embolism 08/20/2006    Past Surgical History:  Procedure Laterality Date   ABDOMINAL EXPLORATION SURGERY     BUNIONECTOMY     right   COLONOSCOPY  2009   ECTOPIC PREGNANCY SURGERY     FOOT SURGERY     TUBAL LIGATION      OB History     Gravida  2   Para  2   Term  2   Preterm      AB      Living  2      SAB      IAB      Ectopic      Multiple      Live Births  Home Medications    Prior to Admission medications   Medication Sig Start Date End Date Taking? Authorizing Provider  albuterol (VENTOLIN HFA) 108 (90 Base) MCG/ACT inhaler Inhale 2 puffs into the lungs every 6 (six) hours as needed for wheezing or shortness of breath. 01/31/21   Rigoberto Noel, MD  Alirocumab (PRALUENT) 75 MG/ML SOAJ INJECT 1 PEN INTO THE SKIN EVERY 14 DAYS 08/11/21   Minus Breeding, MD  apixaban (ELIQUIS) 2.5 MG TABS tablet Take 1 tablet (2.5 mg total) by mouth 2 (two) times daily. 07/18/21   Minus Breeding, MD  blood glucose meter kit and supplies KIT Dispense based on patient and insurance preference. Use up to four times daily as directed. (FOR ICD-9 250.00, 250.01). 04/08/17   Carollee Herter, Alferd Apa, DO  CALCIUM-VITAMIN D PO Take 600 mg by mouth daily.    [provider]  cholecalciferol (VITAMIN D3) 10 MCG (400 UNIT) TABS tablet Take 400 Units by mouth daily.    [provider]  Coenzyme Q10 100 MG TABS Take 100 mg by mouth daily.    [provider]  Flaxseed, Linseed, (FLAXSEED OIL PO) Take 1,000 mg by mouth daily.     [provider]  furosemide (LASIX) 40 MG tablet TAKE 1 TABLET DAILY 01/30/21   Minus Breeding, MD  hydrocortisone (ANUSOL-HC) 25 MG suppository Place 1 suppository (25 mg total) rectally every 12 (twelve) hours. 08/04/21   Roma Schanz R, DO  Insulin Pen Needle (NOVOFINE PLUS PEN NEEDLE) 32G X 4 MM MISC As directed 05/10/20   Ann Held, DO  Multiple Vitamin (MULTIVITAMIN) capsule Take 1 capsule by mouth daily.    [provider]  Omega-3 Fatty Acids (FISH OIL PO) Take 1,000 mg by mouth daily.    [provider]  POTASSIUM CHLORIDE PO Take 1 tablet by mouth as needed (muscle cramps).     [provider]  rosuvastatin (CRESTOR) 10 MG tablet Take 1 tablet (10 mg total) by mouth daily. 09/04/21   Minus Breeding, MD  Semaglutide, 1 MG/DOSE, 4 MG/3ML SOPN Inject 1 mg as directed once a week. 09/04/21   Carollee Herter, Kendrick Fries R, DO  TRUE METRIX BLOOD GLUCOSE TEST test strip TEST  UP  TO FOUR TIMES DAILY AS DIRECTED 12/05/17   Carollee Herter, Alferd Apa, DO  TRUEPLUS LANCETS 33G MISC TEST  UP TO FOUR TIMES DAILY AS DIRECTED 12/05/17   Carollee Herter, Alferd Apa, DO  omega-3 acid ethyl esters (LOVAZA) 1 G capsule Take 1 g by mouth 3 (three) times daily.    05/07/11  [provider]  pravastatin (PRAVACHOL) 20 MG tablet Take 20 mg by mouth daily.    02/04/19  [provider]    Family History Family History  Problem Relation Age of Onset   Heart failure Mother        died from   Colon polyps Mother    Hypertension Mother    Sudden death Mother    Obesity Mother    Alcoholism Father    Diabetes Maternal Aunt        grandaughter   Diabetes Maternal Aunt     Colon cancer Neg Hx    Esophageal cancer Neg Hx    Stomach cancer Neg Hx     Social History Social History   Tobacco Use   Smoking status: Former    Packs/day: 1.00    Years: 50.00    Total pack years: 50.00  Types: Cigarettes    Quit date: 11/21/2009    Years since quitting: 11.8   Smokeless tobacco: Never  Vaping Use   Vaping Use: Never used  Substance Use Topics   Alcohol use: Yes    Comment: rare 3 times a year per pt   Drug use: No     Allergies   Patient has no known allergies.   Review of Systems Review of Systems Per HPI  Physical Exam Triage Vital Signs ED Triage Vitals  Enc Vitals Group     BP 10/05/21 1522 (!) 149/81     Pulse Rate 10/05/21 1522 98     Resp 10/05/21 1522 18     Temp 10/05/21 1522 97.8 F (36.6 C)     Temp src --      SpO2 10/05/21 1522 98 %     Weight --      Height --      Head Circumference --      Peak Flow --      Pain Score 10/05/21 1521 0     Pain Loc --      Pain Edu? --      Excl. in [place]? --    No data found.  Updated Vital Signs BP (!) 149/81   Pulse 98   Temp 97.8 F (36.6 C)   Resp 18   SpO2 98%   Visual Acuity Right Eye Distance:   Left Eye Distance:   Bilateral Distance:    Right Eye Near:   Left Eye Near:    Bilateral Near:     Physical Exam Constitutional:      Appearance: Normal appearance.  HENT:     Head: Normocephalic and atraumatic.  Eyes:     Extraocular Movements: Extraocular movements intact.     Conjunctiva/sclera: Conjunctivae normal.  Cardiovascular:     Pulses: Normal pulses.  Pulmonary:     Effort: Pulmonary effort is normal.  Abdominal:     General: Abdomen is flat. Bowel sounds are normal.     Palpations: Abdomen is soft.  Musculoskeletal:        General: Normal range of motion.  Skin:    General: Skin is warm and dry.  Neurological:     General: No focal deficit present.     Mental Status: She is alert and oriented to person, place, and time. Mental status is at  baseline.  Psychiatric:        Mood and Affect: Mood normal.        Behavior: Behavior normal.        Thought Content: Thought content normal.        Judgment: Judgment normal.      UC Treatments / Results  Labs (all labs ordered are listed, but only abnormal results are displayed) Labs Reviewed  SARS CORONAVIRUS 2 BY RT PCR    EKG   Radiology No results found.  Procedures Procedures (including critical care time)  Medications Ordered in UC Medications - No data to display  Initial Impression / Assessment and Plan / UC Course  I have reviewed the triage vital signs and the nursing notes.  Pertinent labs & imaging results that were available during my care of the patient were reviewed by me and considered in my medical decision making (see chart for details).     Patient presenting only for COVID test for vacation.  COVID test pending.  Patient verbalized understanding and was agreeable with plan. Final Clinical Impressions(s) / UC  Diagnoses   Final diagnoses:  Encounter for laboratory testing for COVID-19 virus     Discharge Instructions      Covid test is pending.  We will call only if it is positive.    ED Prescriptions   None    PDMP not reviewed this encounter.   Teodora Medici, [place] 10/05/21 [phone]

## 2021-10-19 DIAGNOSIS — E785 Hyperlipidemia, unspecified: Secondary | ICD-10-CM

## 2021-10-19 DIAGNOSIS — Z7985 Long-term (current) use of injectable non-insulin antidiabetic drugs: Secondary | ICD-10-CM | POA: Diagnosis not present

## 2021-10-19 DIAGNOSIS — E1169 Type 2 diabetes mellitus with other specified complication: Secondary | ICD-10-CM

## 2021-10-20 DIAGNOSIS — Z1231 Encounter for screening mammogram for malignant neoplasm of breast: Secondary | ICD-10-CM | POA: Diagnosis not present

## 2021-10-20 DIAGNOSIS — Z6835 Body mass index (BMI) 35.0-35.9, adult: Secondary | ICD-10-CM | POA: Diagnosis not present

## 2021-10-20 DIAGNOSIS — Z124 Encounter for screening for malignant neoplasm of cervix: Secondary | ICD-10-CM | POA: Diagnosis not present

## 2021-10-20 LAB — HM MAMMOGRAPHY

## 2021-10-24 ENCOUNTER — Telehealth: Payer: Self-pay | Admitting: Cardiology

## 2021-10-24 NOTE — Telephone Encounter (Signed)
Pt c/o swelling: STAT is pt has developed SOB within 24 hours  How much weight have you gained and in what time span? 4lbs last couple of weeks   If swelling, where is the swelling located? Knees   Are you currently taking a fluid pill? yes  Are you currently SOB? yes  Do you have a log of your daily weights (if so, list)?    Have you gained 3 pounds in a day or 5 pounds in a week? no  Have you traveled recently? Yes (went on cruise 8/19-24th

## 2021-10-24 NOTE — Telephone Encounter (Signed)
Left message for patient of dr hochrein's recommendations. 

## 2021-10-24 NOTE — Progress Notes (Addendum)
Cardiology Office Note:    Date:  10/26/2021   ID:  Sierra Ford, DOB 18-Sep-1940, MRN [id number]  PCP:  Carollee Herter, [place] [facility] [place] [facility] [facility] Cardiologist:  Minus Breeding, MD     Referring MD: Carollee Herter, Alferd Apa, *   CC: Worsening lower extremity swelling and DOE  History of Present Illness:    Sierra Ford is a 81 y.o. female with a hx of chronic diastolic dysfunction, RV strain in the setting of PE, history of DVT, hyperlipidemia, and OSA on CPAP.  She remains on preventative Eliquis in the setting of unprovoked PE.  Most recent echocardiogram 03/03/2019 revealed LVEF 12-45%, grade 1 diastolic dysfunction, normal RV, no significant valvular disease.  She went on a cruise in August and now presents with shortness of breath and lower extremity swelling. States she did not take Lasix while on her cruise and noticed her lower extremity swelling was worse. Denies any increased salt intake. Says she has had chronic lower extremity swelling that is chronically increased on the right than the left lower extremity, and now she says it has worsened. She contacted our office and was instructed to increase Lasix to 80 mg daily on Monday and Tuesday; however, she says she has not taken Lasix yesterday and this morning. Says swelling has improved some but wants to see this come down some more. Says she doesn't take Lasix because she doesn't want to go to the bathroom often; however she says now it looks like she is going to have to take it every day. Hx of DVTs in [place] in 2006, and 2020. Says her symptoms are similar to May 2023, when she had negative Dopplers. Denies any recent chest pain, palpitations, fever, chills, nausea, vomiting, orthopnea, PND, dizziness, syncope, presyncope, or claudication. Has had some dyspnea on exertion after the cruise. She is compliant with Eliquis 5 mg BID. Denies any other questions or concerns today.   Past Medical History:  Diagnosis Date    Anxiety    Back pain    Chest pain    CLite with apical ischemia in 2006 - normal coronary arteries by [facility] in 12/2004;  Myoview 11/12:  Low risk stress nuclear study with a small, partially reversible apical defect most likely related to apical thinning; cannot R/O very mild apical ischemia.  EF: 75%    Decreased hearing    Depression    Diabetes ([place])    DVT (deep venous thrombosis) ([place]) 2006   hx of   Ectopic pregnancy with intrauterine pregnancy    Hiatal hernia    Hyperlipidemia    PE (pulmonary embolism) 2006   hx of   Sleep apnea    CPAP     Past Surgical History:  Procedure Laterality Date   ABDOMINAL EXPLORATION SURGERY     BUNIONECTOMY     right   COLONOSCOPY  2009   ECTOPIC PREGNANCY SURGERY     FOOT SURGERY     TUBAL LIGATION      Current Medications: Current Meds  Medication Sig   Alirocumab (PRALUENT) 75 MG/ML SOAJ INJECT 1 PEN INTO THE SKIN EVERY 14 DAYS   apixaban (ELIQUIS) 5 MG TABS tablet Take 1 tablet (5 mg total) by mouth 2 (two) times daily.    blood glucose meter kit and supplies KIT Dispense based on patient and insurance preference. Use up to four times daily as directed. (FOR ICD-9 250.00, 250.01).   CALCIUM-VITAMIN D PO Take 600 mg  by mouth daily.   cholecalciferol (VITAMIN D3) 10 MCG (400 UNIT) TABS tablet Take 400 Units by mouth daily.   Coenzyme Q10 100 MG TABS Take 100 mg by mouth daily.   Flaxseed, Linseed, (FLAXSEED OIL PO) Take 1,000 mg by mouth daily.    furosemide (LASIX) 40 MG tablet TAKE 1 TABLET DAILY   hydrocortisone (ANUSOL-HC) 25 MG suppository Place 1 suppository (25 mg total) rectally every 12 (twelve) hours.   Insulin Pen Needle (NOVOFINE PLUS PEN NEEDLE) 32G X 4 MM MISC As directed   Multiple Vitamin (MULTIVITAMIN) capsule Take 1 capsule by mouth daily.   Omega-3 Fatty Acids (FISH OIL PO) Take 1,000 mg by mouth daily.   potassium chloride SA (KLOR-CON M) 20 MEQ tablet Take 1 tablet (20 mEq total) by mouth daily.   rosuvastatin  (CRESTOR) 10 MG tablet Take 1 tablet (10 mg total) by mouth daily.   Semaglutide, 1 MG/DOSE, 4 MG/3ML SOPN Inject 1 mg as directed once a week.   TRUE METRIX BLOOD GLUCOSE TEST test strip TEST  UP  TO FOUR TIMES DAILY AS DIRECTED   TRUEPLUS LANCETS 33G MISC TEST  UP TO FOUR TIMES DAILY AS DIRECTED   POTASSIUM CHLORIDE PO Take 1 tablet by mouth as needed (muscle cramps).      Allergies:   Patient has no known allergies.   Social History   Socioeconomic History   Marital status: Divorced    Spouse name: Not on file   Number of children: Not on file   Years of education: Not on file   Highest education level: Not on file  Occupational History   Occupation: Retired Scientist, research (medical)  Tobacco Use   Smoking status: Former    Packs/day: 1.00    Years: 50.00    Total pack years: 50.00    Types: Cigarettes    Quit date: 11/21/2009    Years since quitting: 11.9   Smokeless tobacco: Never  Vaping Use   Vaping Use: Never used  Substance and Sexual Activity   Alcohol use: Yes    Comment: rare 3 times a year per pt   Drug use: No   Sexual activity: Not Currently    Partners: Male  Other Topics Concern   Not on file  Social History Narrative   Regular exercise--- no   Social Determinants of Health   Financial Resource Strain: Medium Risk (03/20/2021)   Overall Financial Resource Strain (CARDIA)    Difficulty of Paying Living Expenses: Somewhat hard  Food Insecurity: No Food Insecurity (08/15/2020)   Hunger Vital Sign    Worried About Running Out of Food in the Last Year: Never true    [place]lim in the Last Year: Never true  Transportation Needs: No Transportation Needs (02/27/2021)   PRAPARE - Hydrologist (Medical): No    Lack of Transportation (Non-Medical): No  Physical Activity: Sufficiently Active (08/15/2020)   Exercise Vital Sign    Days of Exercise per Week: 4 days    Minutes of Exercise per Session: 60 min  Stress: No Stress Concern Present  (08/15/2020)   [place]    Feeling of Stress : Only a little  Social Connections: Moderately Integrated (08/15/2020)   Social Connection and Isolation Panel [NHANES]    Frequency of Communication with Friends and Family: More than three times a week    Frequency of Social Gatherings with Friends and Family: More than three  times a week    Attends Religious Services: More than 4 times per year    Active Member of Clubs or Organizations: Yes    Attends Archivist Meetings: 1 to 4 times per year    Marital Status: Widowed     Family History: The patient's family history includes Alcoholism in her father; Colon polyps in her mother; Diabetes in her maternal aunt and maternal aunt; Heart failure in her mother; Hypertension in her mother; Obesity in her mother; Sudden death in her mother. There is no history of Colon cancer, Esophageal cancer, or Stomach cancer.  ROS:   Review of Systems  Constitutional: Negative.   HENT: Negative.    Eyes: Negative.   Respiratory:  Positive for shortness of breath. Negative for cough, hemoptysis, sputum production and wheezing.        See HPI.  Cardiovascular:  Positive for leg swelling. Negative for chest pain, palpitations, orthopnea, claudication and PND.       See HPI.  Gastrointestinal: Negative.   Genitourinary: Negative.   Musculoskeletal: Negative.   Skin: Negative.   Neurological: Negative.   Endo/Heme/Allergies: Negative.   Psychiatric/Behavioral: Negative.      Please see the history of present illness.    All other systems reviewed and are negative.  EKGs/Labs/Other Studies Reviewed:    The following studies were reviewed today:  Vascular US lower extremity venous doppler (DVT) on 06/23/2021: Negative for DVTs bilaterally.   Echo 03/03/19:  1. Left ventricular ejection fraction, by visual estimation, is 55 to  60%. The left ventricle has normal function.  There is mildly increased  left ventricular hypertrophy.   2. Left ventricular diastolic parameters are consistent with Grade I  diastolic dysfunction (impaired relaxation).   3. The left ventricle has no regional wall motion abnormalities.   4. Global right ventricle has normal systolic function.The right  ventricular size is normal.   5. Left atrial size was normal.   6. Right atrial size was normal.   7. Mild mitral annular calcification.   8. The mitral valve is normal in structure. No evidence of mitral valve  regurgitation. No evidence of mitral stenosis.   9. The tricuspid valve is normal in structure.  10. The aortic valve is tricuspid. Aortic valve regurgitation is not  visualized. Mild aortic valve sclerosis without stenosis.  11. The pulmonic valve was normal in structure. Pulmonic valve  regurgitation is not visualized.  12. The inferior vena cava is normal in size with greater than 50%  respiratory variability, suggesting right atrial pressure of 3 mmHg.  13. Normal LV systolic function; grade 1 diastolic dysfunction.   EKG:  EKG is not ordered today.    Recent Labs: 09/04/2021: ALT 19; BUN 16; Creatinine, Ser 0.70; Hemoglobin 14.0; Platelets 248.0; Potassium 4.1; Sodium 140  Recent Lipid Panel    Component Value Date/Time   CHOL 201 (H) 09/04/2021 1102   CHOL 209 (H) 08/26/2018 1018   TRIG 68.0 09/04/2021 1102   HDL 119.20 09/04/2021 1102   HDL 101 08/26/2018 1018   CHOLHDL 2 09/04/2021 1102   VLDL 13.6 09/04/2021 1102   LDLCALC 68 09/04/2021 1102   LDLCALC 69 11/10/2019 1122   LDLDIRECT 95.9 10/06/2012 1248         Physical Exam:    VS:  BP 122/68   Pulse 74   Ht '5\' 2"'  (1.575 m)   Wt 194 lb 9.6 oz (88.3 kg)   SpO2 95%   BMI 35.59 kg/m  Wt Readings from Last 3 Encounters:  10/26/21 194 lb 9.6 oz (88.3 kg)  09/04/21 195 lb 12.8 oz (88.8 kg)  08/04/21 193 lb 9.6 oz (87.8 kg)     GEN: Well nourished, well developed in no acute distress HEENT:  Normal NECK: No JVD; No carotid bruits CARDIAC: S1/S2, RRR, no murmurs, rubs, gallops; 2+ peripheral radial pulses, strong and equal bilaterally. 1+ peripheral pulses along posterior tibials, equal bilaterally. RESPIRATORY:  Clear and diminished to auscultation without rales, wheezing or rhonchi  ABDOMEN: Soft, non-tender, non-distended MUSCULOSKELETAL:  3+ pitting edema (R>L) with increased focal edema around right lower ankle, 1+ pitting edema along LLE; No deformity  SKIN: Warm and dry NEUROLOGIC:  Alert and oriented x 3 PSYCHIATRIC:  Normal affect   ASSESSMENT:    1. Leg edema   2. History of DVT (deep vein thrombosis)   3. DOE (dyspnea on exertion)   4. Chronic diastolic HF (heart failure) ([place])   5. Hyperlipidemia, unspecified hyperlipidemia type    PLAN:    In order of problems listed above:  Leg edema - chronic, worsening Noncompliant with Lasix. R> L chronically. Low suspicion for DVT as 06/2021 Doppler was negative for DVT as she had similar swelling in 06/2021 and she was compliant with her Eliquis and skipping doses of Lasix at that time. Has skipped doses of Lasix yesterday and today and she remains compliant with Eliquis. Will increase Lasix to 80 mg X 5 days then reduce to 40 mg daily thereafter. Will Rx Potassium Chloride 40 meQ X 5 days then reduce to 20 mEq daily thereafter.   2. History of DVT (remote)  Hx of DVT in RLE in 2006 and 2020. She is compliant with Eliquis and is on appropriate dosing of Eliquis. Continue Eliquis 5 mg BID. Venous Doppler in 06/2021 was negative for DVTs.  3. Dyspnea on exertion - recent Etiology most likely due to noncompliance with Lasix. No adventitious breath sounds on exam, no JVD, no tachycardia and no extra heart sounds. No labored breathing on exam. Denies orthopnea/ PND. 2D Echo in 2021 revealed LVEF of 55-60%. If symptom does not improve before next visit, will consider updating 2D echo.   4. Chronic diastolic HF - chronic 2D echo  in 2021 revealed LVEF 55-60% as mentioned above. Low sodium diet, fluid restriction <2L, and daily weights encouraged. Educated to contact our office for weight gain of 2 lbs overnight or 5 lbs in one week. Will increase Lasix X 5 days and then reduce daily dosage  with potassium supplement as mentioned above. Discussed medication compliance with Lasix, as well as compression stockings and feet elevation.   5. HLD - chronic, stable Last LDL was 68. Continue Crestor 10 mg daily.     6. Disposition: F/U with me or Fabian Sharp, PA-C in 2 weeks or sooner if needed.    Medication Adjustments/Labs and Tests Ordered: Current medicines are reviewed at length with the patient today.  Concerns regarding medicines are outlined above.  No orders of the defined types were placed in this encounter.  Meds ordered this encounter  Medications   potassium chloride SA (KLOR-CON M) 20 MEQ tablet    Sig: Take 1 tablet (20 mEq total) by mouth daily.    Dispense:  90 tablet    Refill:  3    Patient Instructions  Medication Instructions:  Your physician has recommended you make the following change in your medication:  INCREASE: Lasix 68m (2 tablets) daily for 5 daily  then on the 6th day return back to your normal dosage of Lasix 58m daily. (1 tablet)  START: Potassium 20 meq daily. THE FIRST 5 Days please take 40 meq's of potassium (2 tablets) then on the 6th day return to taking the original dosage of 20 meq's daily. (1 tablet)  STOP: Over the counter Potassium  *If you need a refill on your cardiac medications before your next appointment, please call your pharmacy*   Lab Work: NONE If you have labs (blood work) drawn today and your tests are completely normal, you will receive your results only by: [place](if you have MyChart) OR A paper copy in the mail If you have any lab test that is abnormal or we need to change your treatment, we will call you to review the  results.   Testing/Procedures: NONE   Follow-Up: [facility] and your [facility] needs are our priority.  As part of our continuing mission to provide you with exceptional heart care, we have created designated Provider Care Teams.  These Care Teams include your primary Cardiologist (physician) and Advanced Practice Providers (APPs -  Physician Assistants and Nurse Practitioners) who all work together to provide you with the care you need, when you need it.  We recommend signing up for the patient portal called "MyChart".  Sign up information is provided on this After Visit Summary.  MyChart is used to connect with patients for Virtual Visits (Telemedicine).  Patients are able to view lab/test results, encounter notes, upcoming appointments, etc.  Non-urgent messages can be sent to your provider as well.   To learn more about what you can do with MyChart, go to [email or web address]    Your next appointment:   2 week(s)  The format for your next appointment:   In Person  Provider:   AFabian Sharp PA-C     Please put her on Angie's schedule on 9/13 so that she can see EFinis Bud   Signed,Finis Bud NP  10/26/2021 10:27 AM    [place]

## 2021-10-24 NOTE — Telephone Encounter (Signed)
-  Pt called to report bilateral leg swelling x 3 weeks and SOB with exertion.  -She denies significant weight increase but stated she has gained roughly 3-4 lbs in the past 2-3 weeks due to a recent cruise vacation. -Pt stated she has taking an extra 40 mg of lasix x 2 days,urinating frequently, but symptoms has not resolved. -She report they will decrease a little with elevation, but will eventually swell again. -Pt reported her legs look like you can pop them with a pen  Appointment scheduled for 9/7 for further evaluations but will forward message to Dr. Percival Spanish for further recommendations.

## 2021-10-26 ENCOUNTER — Encounter: Payer: Self-pay | Admitting: Physician Assistant

## 2021-10-26 ENCOUNTER — Ambulatory Visit: Payer: Medicare PPO | Attending: Physician Assistant | Admitting: Nurse Practitioner

## 2021-10-26 VITALS — BP 122/68 | HR 74 | Ht 62.0 in | Wt 194.6 lb

## 2021-10-26 DIAGNOSIS — R0609 Other forms of dyspnea: Secondary | ICD-10-CM

## 2021-10-26 DIAGNOSIS — R600 Localized edema: Secondary | ICD-10-CM

## 2021-10-26 DIAGNOSIS — Z86718 Personal history of other venous thrombosis and embolism: Secondary | ICD-10-CM

## 2021-10-26 DIAGNOSIS — E785 Hyperlipidemia, unspecified: Secondary | ICD-10-CM

## 2021-10-26 DIAGNOSIS — I5032 Chronic diastolic (congestive) heart failure: Secondary | ICD-10-CM

## 2021-10-26 MED ORDER — POTASSIUM CHLORIDE CRYS ER 20 MEQ PO TBCR: 20.0000 meq | EXTENDED_RELEASE_TABLET | Freq: Every day | ORAL | 3 refills | Status: DC

## 2021-10-26 NOTE — Patient Instructions (Addendum)
Medication Instructions:  Your physician has recommended you make the following change in your medication:  INCREASE: Lasix '80mg'$  (2 tablets) daily for 5 daily then on the 6th day return back to your normal dosage of Lasix '40mg'$  daily. (1 tablet)  START: Potassium 20 meq daily. THE FIRST 5 Days please take 40 meq's of potassium (2 tablets) then on the 6th day return to taking the original dosage of 20 meq's daily. (1 tablet)  STOP: Over the counter Potassium  *If you need a refill on your cardiac medications before your next appointment, please call your pharmacy*   Lab Work: NONE If you have labs (blood work) drawn today and your tests are completely normal, you will receive your results only by: [place] (if you have MyChart) OR A paper copy in the mail If you have any lab test that is abnormal or we need to change your treatment, we will call you to review the results.   Testing/Procedures: NONE   Follow-Up: At [facility], you and your health needs are our priority.  As part of our continuing mission to provide you with exceptional heart care, we have created designated Provider Care Teams.  These Care Teams include your primary Cardiologist (physician) and Advanced Practice Providers (APPs -  Physician Assistants and Nurse Practitioners) who all work together to provide you with the care you need, when you need it.  We recommend signing up for the patient portal called "MyChart".  Sign up information is provided on this After Visit Summary.  MyChart is used to connect with patients for Virtual Visits (Telemedicine).  Patients are able to view lab/test results, encounter notes, upcoming appointments, etc.  Non-urgent messages can be sent to your provider as well.   To learn more about what you can do with MyChart, go to [email or web address].    Your next appointment:   2 week(s)  The format for your next appointment:   In Person  Provider:   Fabian Sharp, PA-C      Please put her on Angie's schedule on 9/13 so that she can see Finis Bud.

## 2021-11-07 NOTE — Progress Notes (Unsigned)
Office Visit    Patient Name: Sierra Ford Date of Encounter: 11/08/2021  Primary Care Provider:  Carollee Herter, Alferd Apa, DO Primary Cardiologist:  Minus Breeding, MD Primary Electrophysiologist: None  Chief Complaint    Sierra Ford is a 81 y.o. female with PMH of HFpEF, OSA (on CPAP), HLD, history of DVT (on Eliquis) who presents today for 2-week follow-up of lower extremity edema.  Past Medical History    Past Medical History:  Diagnosis Date   Anxiety    Back pain    Chest pain    CLite with apical ischemia in 2006 - normal coronary arteries by [facility] in 12/2004;  Myoview 11/12:  Low risk stress nuclear study with a small, partially reversible apical defect most likely related to apical thinning; cannot R/O very mild apical ischemia.  EF: 75%    Decreased hearing    Depression    Diabetes ([place])    DVT (deep venous thrombosis) ([place]) 2006   hx of   Ectopic pregnancy with intrauterine pregnancy    Hiatal hernia    Hyperlipidemia    PE (pulmonary embolism) 2006   hx of   Sleep apnea    CPAP    Past Surgical History:  Procedure Laterality Date   ABDOMINAL EXPLORATION SURGERY     BUNIONECTOMY     right   COLONOSCOPY  2009   ECTOPIC PREGNANCY SURGERY     FOOT SURGERY     TUBAL LIGATION      Allergies  No Known Allergies  History of Present Illness    Sierra Ford  is a 81 year old female with the above mention past medical history who presents today for 2-week follow-up of lower extremity edema.  Sierra Ford was initially seen by Dr. Percival Spanish and 2011 for lower extremity edema.  She had previous DVT in 2006 and patient also had LHC completed in 2006 with normal coronaries.  Patient had 2D echo completedechocardiogram which suggested some mild diastolic dysfunction but no elevated pulmonary pressures, systolic dysfunction or valvular abnormalities.  She was seen and 2019 following hospital discharge for shortness of breath.  She was found to have acute PE  with right heart strain that was managed with anticoagulation.  She was last seen by Dr. Percival Spanish on 06/2021 for complaint of leg pain and Swelling and soreness.  She was euvolemic on examination and patient had Doppler completed with no evidence of DVT.  She returned for visit on 9/7 due to increased lower extremity edema and was seen by Finis Bud, NP.  Patient's most recent 2D echo on 02/2019 showed EF of 55-60%, grade 1 DD with normal RV and no significant valve disease.  Patient had recently went on a cruise and presented with shortness of breath and leg swelling.  She was noted to be noncompliant with her Lasix with skip doses.  She was instructed to take Lasix 80 mg x 5 days and then reduce to 40 mg daily.  She was given a prescription for potassium chloride 40 mEq x 5 days then reduce to 20 mEq daily.  She was also advised to use compression stockings and elevate her feet when dependent.  Sierra Ford presents today alone for 2-week follow-up of lower extremity swelling.  Since last being seen in the office patient reports that her swelling has improved with increased dosage of Lasix.  She does have chronic swelling in her right lower extremity that is also the leg where she has  had 2 DVTs.  I advised her to follow back up with her vein specialist for further treatment of her chronic right lower extremity swelling.  She is compliant with her current daily regimen of Lasix. She denies any sodium indiscretions and is currently planning to resume her Silver sneakers program.  She is euvolemic on examination today and has no shortness of breath at rest.  She does not endorse shortness of breath with exertion that improves with rest.  Patient denies chest pain, palpitations, dyspnea, PND, orthopnea, nausea, vomiting, dizziness, syncope, edema, weight gain, or early satiety.   Home Medications    Current Outpatient Medications  Medication Sig Dispense Refill   Alirocumab (PRALUENT) 75 MG/ML SOAJ  INJECT 1 PEN INTO THE SKIN EVERY 14 DAYS 2 mL 11   apixaban (ELIQUIS) 2.5 MG TABS tablet Take 1 tablet (2.5 mg total) by mouth 2 (two) times daily. 60 tablet 5   blood glucose meter kit and supplies KIT Dispense based on patient and insurance preference. Use up to four times daily as directed. (FOR ICD-9 250.00, 250.01). 1 each 0   CALCIUM-VITAMIN D PO Take 600 mg by mouth daily.     cholecalciferol (VITAMIN D3) 10 MCG (400 UNIT) TABS tablet Take 400 Units by mouth daily.     Coenzyme Q10 100 MG TABS Take 100 mg by mouth daily.     Flaxseed, Linseed, (FLAXSEED OIL PO) Take 1,000 mg by mouth daily.      furosemide (LASIX) 40 MG tablet TAKE 1 TABLET DAILY 90 tablet 2   hydrocortisone (ANUSOL-HC) 25 MG suppository Place 1 suppository (25 mg total) rectally every 12 (twelve) hours. 12 suppository 1   Insulin Pen Needle (NOVOFINE PLUS PEN NEEDLE) 32G X 4 MM MISC As directed 100 each 1   Multiple Vitamin (MULTIVITAMIN) capsule Take 1 capsule by mouth daily.     Omega-3 Fatty Acids (FISH OIL PO) Take 1,000 mg by mouth daily.     potassium chloride SA (KLOR-CON M) 20 MEQ tablet Take 1 tablet (20 mEq total) by mouth daily. 90 tablet 3   rosuvastatin (CRESTOR) 10 MG tablet Take 1 tablet (10 mg total) by mouth daily. 90 tablet 2   Semaglutide, 1 MG/DOSE, 4 MG/3ML SOPN Inject 1 mg as directed once a week. 3 mL 3   TRUE METRIX BLOOD GLUCOSE TEST test strip TEST  UP  TO FOUR TIMES DAILY AS DIRECTED 200 each 12   TRUEPLUS LANCETS 33G MISC TEST  UP TO FOUR TIMES DAILY AS DIRECTED 200 each 12   No current facility-administered medications for this visit.     Review of Systems  Please see the history of present illness.    (+) Chronic right lower extremity swelling (+) Shortness of breath with exertion  All other systems reviewed and are otherwise negative except as noted above.  Physical Exam    Wt Readings from Last 3 Encounters:  11/08/21 190 lb 3.2 oz (86.3 kg)  10/26/21 194 lb 9.6 oz (88.3 kg)   09/04/21 195 lb 12.8 oz (88.8 kg)   VS: Vitals:   11/08/21 0921  BP: 126/70  Pulse: 90  SpO2: 97%  ,Body mass index is 34.79 kg/m.  Constitutional:      Appearance: Healthy appearance. Not in distress.  Neck:     Vascular: JVD normal.  Pulmonary:     Effort: Pulmonary effort is normal.     Breath sounds: No wheezing. No rales. Diminished in the bases Cardiovascular:  Normal rate. Regular rhythm. Normal S1. Normal S2.      Murmurs: There is no murmur.  Edema:    Peripheral edema and right lower extremity chronic no edema and left lower extremity Abdominal:     Palpations: Abdomen is soft non tender. There is no hepatomegaly.  Skin:    General: Skin is warm and dry.  Neurological:     General: No focal deficit present.     Mental Status: Alert and oriented to person, place and time.     Cranial Nerves: Cranial nerves are intact.  EKG/LABS/Other Studies Reviewed    ECG personally reviewed by me today -none completed today  Risk Assessment/Calculations:            Lab Results  Component Value Date   WBC 4.4 09/04/2021   HGB 14.0 09/04/2021   HCT 42.5 09/04/2021   MCV 98.6 09/04/2021   PLT 248.0 09/04/2021   Lab Results  Component Value Date   CREATININE 0.70 09/04/2021   BUN 16 09/04/2021   NA 140 09/04/2021   K 4.1 09/04/2021   CL 103 09/04/2021   CO2 28 09/04/2021   Lab Results  Component Value Date   ALT 19 09/04/2021   AST 16 09/04/2021   ALKPHOS 79 09/04/2021   BILITOT 1.0 09/04/2021   Lab Results  Component Value Date   CHOL 201 (H) 09/04/2021   HDL 119.20 09/04/2021   LDLCALC 68 09/04/2021   LDLDIRECT 95.9 10/06/2012   TRIG 68.0 09/04/2021   CHOLHDL 2 09/04/2021    Lab Results  Component Value Date   HGBA1C 7.0 (H) 09/04/2021    Assessment & Plan    1.  Lower extremity edema: -Patient presented 2 weeks ago with lower extremity edema due to noncompliance with Lasix. -Today patient reports she has been compliant with her Lasix  daily. -Today she is euvolemic with chronic swelling in right lower extremity and no swelling present in left lower extremity.  2.  HFpEF: -Most recent 2D echo completed 2021 with EF of 55-60%, grade 1 DD with normal RV and no significant valve disease.  -Today patient is euvolemic on examination -Continue Lasix 40 mg daily and extra 40 mg as needed for lower extremity swelling -BMET today -We will also recheck 2D echo to check for valve abnormalities and heart structure changes.  3.  History of DVT: -Patient had recent lower extremity Doppler in 06/2021 that was negative for DVT -Patient is currently taking Eliquis 5 mg twice daily however she was prescribed 2.5 mg and reduce dose by Dr. Percival Spanish. -I will reach out to Dr. Percival Spanish for clarification on proper dose -She reports no occult bleeding with Eliquis and patient's creatinine is appropriate for current dose.  4.  Essential hypertension: -Patient's blood pressure today was well controlled today at 126/70 -Patient encouraged to increase physical activity and continue low-sodium heart healthy diet  5.  Dyspnea on exertion: -Patient has dyspnea with extreme exertion that sounds related to deconditioning. -She reports that she has not participated in her Silver sneakers program for last 3 months -We will recheck 2D echo as noted above and patient encouraged to increase physical activity as tolerated  Disposition: Follow-up with Minus Breeding, MD or APP in 6 months   Medication Adjustments/Labs and Tests Ordered: Current medicines are reviewed at length with the patient today.  Concerns regarding medicines are outlined above.   Signed, Mable Fill, Marissa Nestle, NP 11/08/2021, 10:03 AM [place]

## 2021-11-08 ENCOUNTER — Encounter: Payer: Self-pay | Admitting: Nurse Practitioner

## 2021-11-08 ENCOUNTER — Ambulatory Visit: Payer: Medicare PPO | Admitting: Physician Assistant

## 2021-11-08 ENCOUNTER — Ambulatory Visit: Payer: Medicare PPO | Attending: Physician Assistant | Admitting: Nurse Practitioner

## 2021-11-08 VITALS — BP 126/70 | HR 90 | Ht 62.0 in | Wt 190.2 lb

## 2021-11-08 DIAGNOSIS — R600 Localized edema: Secondary | ICD-10-CM | POA: Diagnosis not present

## 2021-11-08 DIAGNOSIS — Z86718 Personal history of other venous thrombosis and embolism: Secondary | ICD-10-CM

## 2021-11-08 DIAGNOSIS — I10 Essential (primary) hypertension: Secondary | ICD-10-CM

## 2021-11-08 DIAGNOSIS — I5032 Chronic diastolic (congestive) heart failure: Secondary | ICD-10-CM | POA: Diagnosis not present

## 2021-11-08 DIAGNOSIS — R0602 Shortness of breath: Secondary | ICD-10-CM | POA: Diagnosis not present

## 2021-11-08 LAB — BASIC METABOLIC PANEL
BUN/Creatinine Ratio: 19 (ref 12–28)
BUN: 17 mg/dL (ref 8–27)
CO2: 26 mmol/L (ref 20–29)
Calcium: 9.9 mg/dL (ref 8.7–10.3)
Chloride: 96 mmol/L (ref 96–106)
Creatinine, Ser: 0.89 mg/dL (ref 0.57–1.00)
Glucose: 126 mg/dL — ABNORMAL HIGH (ref 70–99)
Potassium: 4.7 mmol/L (ref 3.5–5.2)
Sodium: 139 mmol/L (ref 134–144)
eGFR: 65 mL/min/{1.73_m2} (ref >59)

## 2021-11-08 NOTE — Patient Instructions (Signed)
Medication Instructions:  Your physician has recommended you make the following change in your medication:  Continue taking Eliquis 2.'5mg'$  2 times daily Take an extra dose of Lasix '40mg'$  if you gain 2 lbs in 24 hours or 5 lbs in a week.   *If you need a refill on your cardiac medications before your next appointment, please call your pharmacy*   Lab Work: [place]  If you have labs (blood work) drawn today and your tests are completely normal, you will receive your results only by: [place] (if you have MyChart) OR A paper copy in the mail If you have any lab test that is abnormal or we need to change your treatment, we will call you to review the results.   Testing/Procedures: ECHO Your physician has requested that you have an echocardiogram. Echocardiography is a painless test that uses sound waves to create images of your heart. It provides your doctor with information about the size and shape of your heart and how well your heart's chambers and valves are working. This procedure takes approximately one hour. There are no restrictions for this procedure.    Follow-Up: At [facility], you and your health needs are our priority.  As part of our continuing mission to provide you with exceptional heart care, we have created designated Provider Care Teams.  These Care Teams include your primary Cardiologist (physician) and Advanced Practice Providers (APPs -  Physician Assistants and Nurse Practitioners) who all work together to provide you with the care you need, when you need it.  We recommend signing up for the patient portal called "MyChart".  Sign up information is provided on this After Visit Summary.  MyChart is used to connect with patients for Virtual Visits (Telemedicine).  Patients are able to view lab/test results, encounter notes, upcoming appointments, etc.  Non-urgent messages can be sent to your provider as well.   To learn more about what you can do with MyChart, go  to [email or web address].    Your next appointment:   6 month(s)  The format for your next appointment:   In Person  Provider:   Ambrose Pancoast, NP        Other Instructions   Important Information About [place]

## 2021-11-09 ENCOUNTER — Telehealth: Payer: Self-pay | Admitting: Nurse Practitioner

## 2021-11-09 NOTE — Telephone Encounter (Signed)
Patient is returning RN's call for her lab results. Please advise.

## 2021-11-09 NOTE — Telephone Encounter (Signed)
Message Received: Today Marylu Lund., NP  P Cv Div Ch St Triage Patient's renal function and electrolytes are stable.  Continue Lasix regimen as prescribed.   Ambrose Pancoast, NP     The patient has been notified of the result and verbalized understanding.  All questions (if any) were answered. Nuala Alpha, LPN [id number] 5:67 PM

## 2021-11-10 ENCOUNTER — Telehealth: Payer: Self-pay | Admitting: Family Medicine

## 2021-11-10 NOTE — Telephone Encounter (Signed)
Pt called stating she is currently unable to get her Ozempic refilled anywhere and was wondering if we could give her a sample to get her to when her pharmacy can refill it.

## 2021-11-12 NOTE — Progress Notes (Signed)
81 year old patient comes in with complaints of right ankle pain.

## 2021-11-14 NOTE — Telephone Encounter (Signed)
Pt called back wanting an update. She is very frustrated. Please advise.

## 2021-11-14 NOTE — Telephone Encounter (Signed)
We do not have any samples in her dose. Please advise

## 2021-11-15 ENCOUNTER — Other Ambulatory Visit: Payer: Self-pay

## 2021-11-15 ENCOUNTER — Other Ambulatory Visit (HOSPITAL_BASED_OUTPATIENT_CLINIC_OR_DEPARTMENT_OTHER): Payer: Self-pay

## 2021-11-15 DIAGNOSIS — E1165 Type 2 diabetes mellitus with hyperglycemia: Secondary | ICD-10-CM

## 2021-11-15 MED ORDER — SEMAGLUTIDE (1 MG/DOSE) 4 MG/3ML ~~LOC~~ SOPN
1.0000 mg | PEN_INJECTOR | SUBCUTANEOUS | 3 refills | Status: DC
  Filled 2021-11-15: qty 3, 28d supply, fill #0

## 2021-11-15 NOTE — Telephone Encounter (Signed)
Called pharmacy downstairs and they did have Ozempic available. I sent a new Rx downstairs. Pt was called and I left a detailed message.

## 2021-11-17 ENCOUNTER — Telehealth: Payer: Self-pay | Admitting: Cardiology

## 2021-11-17 NOTE — Telephone Encounter (Signed)
Spoke to patient she stated she was calling to clarify Eliquis dose.Stated when she saw Ambrose Pancoast NP last week she was taking Eliquis 2.5 mg 2 tablets twice a day.On her AVS it said Eliquis 2.5 mg twice a day.She wanted to ask Dr.Hochrein if she should take '5mg'$  twice a day or 2.5 mg twice a day.I will send message to him for advice.

## 2021-11-17 NOTE — Telephone Encounter (Signed)
Pt c/o medication issue:  1. Name of Medication: Eliquis  2. How are you currently taking this medication (dosage and times per day)? 2.5 mg taking 2 to make it 5 mg  3. Are you having a reaction (difficulty breathing--STAT)?   4. What is your medication issue? What mg of Eliquis, should she be taking 5 mg or 2.5 mg?

## 2021-11-20 ENCOUNTER — Ambulatory Visit: Payer: Medicare PPO | Admitting: Pharmacist

## 2021-11-20 DIAGNOSIS — E1169 Type 2 diabetes mellitus with other specified complication: Secondary | ICD-10-CM

## 2021-11-20 DIAGNOSIS — I10 Essential (primary) hypertension: Secondary | ICD-10-CM

## 2021-11-20 DIAGNOSIS — E1165 Type 2 diabetes mellitus with hyperglycemia: Secondary | ICD-10-CM

## 2021-11-20 DIAGNOSIS — I5032 Chronic diastolic (congestive) heart failure: Secondary | ICD-10-CM

## 2021-11-20 NOTE — Progress Notes (Signed)
  Pharmacy Note  11/20/2021 Name: Sierra Ford MRN: [id number] DOB: 10/29/1940  Subjective: Sierra Ford is a 81 y.o. year old female who is a primary care patient of Lowne Chase, Yvonne R, DO. Clinical Pharmacist Practitioner referral was placed to assist with medication and diabetes management.    Engaged with patient by telephone for follow up visit today.  Type 2 DM: patient was having difficulty getting Ozempic last week but states that she was able to get Ozempic at [facility] on [address] blood glucose this morning 112. Usually 120's to 130's. Discussed annual eye exam. Able to use discount card with Ozempic and cost is $50/month.  CHF / Edema; Taking furosemide 40mg daily and potassium chloride 20 mEq daily. Last EF was 55 to 60% (03/03/2019). Improved edema / shortness of breath since restarted furosmide.  Hyperlipidemia: Taking Praulent and rosuvastatin. She previously was enrolled in [facility] but funding has run out. She is paying $35 per month for Praulent.  Loose stools: Patient mentions she has loose stool almost daily,  usually occurs in morning. Last colonscopy 06/2017.    Recent office Visits / hospital visits:  11/08/2021 - Cardio (Dick, NP) CHF / LEE. Note improved edema and SOB with improved adherence with furosemide. Continue furosemide 40mg daily. Ordered 2D ECHO.  10/26/2021 - Cardio (Peck, NP) CHF - SOB and LEE. Did not take furosemide while on her cruise. Increased furosemide to 80mg for 5 days, then decrease to 40mg daily thereafter. Prescribed potassium 40 mEQ for 5 days, then 20 mEq thereafter 10/04/2021 - Ortho (Dr Gioffre) buisitis of left hip and pain in left shoulder joint 10/05/2021 - ED Visit at [facility] - presented for COVID testing for vacation  Objective: Review of patient status, including review of consultants reports, laboratory and other test data, was performed as part of comprehensive evaluation and provision of  chronic care management services.   Lab Results  Component Value Date   CREATININE 0.89 11/08/2021   CREATININE 0.70 09/04/2021   CREATININE 0.84 04/04/2021    Lab Results  Component Value Date   HGBA1C 7.0 (H) 09/04/2021       Component Value Date/Time   CHOL 201 (H) 09/04/2021 1102   CHOL 209 (H) 08/26/2018 1018   TRIG 68.0 09/04/2021 1102   HDL 119.20 09/04/2021 1102   HDL 101 08/26/2018 1018   CHOLHDL 2 09/04/2021 1102   VLDL 13.6 09/04/2021 1102   LDLCALC 68 09/04/2021 1102   LDLCALC 69 11/10/2019 1122   LDLDIRECT 95.9 10/06/2012 1248     Clinical ASCVD: Yes  The ASCVD Risk score (Arnett DK, et al., 2019) failed to calculate for the following reasons:   The 2019 ASCVD risk score is only valid for ages 40 to 79    BP Readings from Last 3 Encounters:  11/08/21 126/70  10/26/21 122/68  10/05/21 (!) 149/81     No Known Allergies  Medications Reviewed Today     Reviewed by Dick, Ernest H Jr., NP (Nurse Practitioner) on 11/08/21 at 1017  Med List Status: <None>   Medication Order Taking? Sig Documenting Provider Last Dose Status Informant  Alirocumab (PRALUENT) 75 MG/ML SOAJ [id number] Yes INJECT 1 PEN INTO THE SKIN EVERY 14 DAYS Hochrein, James, MD Taking Active   apixaban (ELIQUIS) 2.5 MG TABS tablet [id number] Yes Take 1 tablet (2.5 mg total) by mouth 2 (two) times daily. Hochrein, James, MD Taking Active   blood glucose meter kit and supplies KIT [id number] Yes   Dispense based on patient and insurance preference. Use up to four times daily as directed. (FOR ICD-9 250.00, 250.01). Ann Held, DO Taking Active Self  CALCIUM-VITAMIN D PO [id number] Yes Take 600 mg by mouth daily. [provider] Taking Active Self  cholecalciferol (VITAMIN D3) 10 MCG (400 UNIT) TABS tablet [id number] Yes Take 400 Units by mouth daily. [provider] Taking Active Self  Coenzyme Q10 100 MG TABS [id number] Yes Take 100 mg by mouth daily. [provider]  Taking Active Self  Flaxseed, Linseed, (FLAXSEED OIL PO) [id number] Yes Take 1,000 mg by mouth daily.  [provider] Taking Active Self  furosemide (LASIX) 40 MG tablet [id number] Yes TAKE 1 TABLET DAILY Hochrein, Jeneen Rinks, MD Taking Active            Med Note Lia Hopping, MINDY L   Wed Nov 08, 2021  9:21 AM)    hydrocortisone (ANUSOL-HC) 25 MG suppository [id number] Yes Place 1 suppository (25 mg total) rectally every 12 (twelve) hours. Roma Schanz R, DO Taking Active   Insulin Pen Needle (NOVOFINE PLUS PEN NEEDLE) 32G X 4 MM MISC [id number] Yes As directed Ann Held, DO Taking Active   Multiple Vitamin (MULTIVITAMIN) capsule [id number] Yes Take 1 capsule by mouth daily. [provider] Taking Active     Discontinued 05/07/11 1124 (Discontinued by provider) Omega-3 Fatty Acids (FISH OIL PO) [id number] Yes Take 1,000 mg by mouth daily. [provider] Taking Active Self  potassium chloride SA (KLOR-CON M) 20 MEQ tablet [id number] Yes Take 1 tablet (20 mEq total) by mouth daily. Finis Bud, NP Taking Active     Discontinued 05/07/11 1124 (Change in therapy) rosuvastatin (CRESTOR) 10 MG tablet [id number] Yes Take 1 tablet (10 mg total) by mouth daily. Minus Breeding, MD Taking Active   Semaglutide, 1 MG/DOSE, 4 MG/3ML SOPN [id number] Yes Inject 1 mg as directed once a week. Carollee Herter, Kendrick Fries R, DO Taking Active   TRUE METRIX BLOOD GLUCOSE TEST test strip [id number] Yes TEST  UP  TO FOUR TIMES DAILY AS DIRECTED Ann Held, DO Taking Active Self  TRUEPLUS LANCETS 33G MISC [id number] Yes TEST  UP TO FOUR TIMES DAILY AS DIRECTED Ann Held, DO Taking Active Self            Patient Active Problem List   Diagnosis Date Noted   Lower abdominal pain 04/04/2021   Rectal bleeding 04/04/2021   Change in bowel habits 04/04/2021   Internal hemorrhoids 04/04/2021   Preventative health care 03/07/2021   Sacroiliitis ([place]) 03/07/2021   Morbid  (severe) obesity due to excess calories ([place]) 03/07/2021   Bronchitis 12/20/2020   Other pulmonary embolism with acute cor pulmonale, unspecified chronicity ([place])    Educated about COVID-19 virus infection 01/26/2019   Hyperlipidemia associated with type 2 diabetes mellitus ([place]) 04/24/2018   Right ankle swelling 05/13/2017   Diabetes mellitus, type II ([place]) 04/08/2017   Essential hypertension 04/08/2017   Atherosclerosis of aorta ([place]) 04/08/2017   Abnormal auditory perception of both ears 10/01/2016   Bilateral impacted cerumen 10/01/2016   OSA (obstructive sleep apnea) 12/16/2015   DOE (dyspnea on exertion) 06/12/2013   Obesity (BMI 30-39.9) 04/24/2013   Chest pain, unspecified 01/22/2011   Abnormal stress test 01/22/2011   Frequent urination 01/22/2011   Chronic diastolic heart failure ([place]) 05/15/2009   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 She is able to use discount coupon for Ozempic and Praluent. Has used [employer] in past as well.    Assessment / Plan Type 2 DM: controlled Continue Ozempic 62m weekly Continue to check blood glucose at home daily. Reviewed blood glucose goals.  CHF / Edema - improved Continue furosemide 451mdaily and potassium chloride 20 mEq daily.  Discussed signs and symptoms of CHF exacerbation - weight gain, SOB, abdominal fullness, swelling in legs or abdomen, Fatigue and weakness, changes  in ability to perform usual activities, persistent cough or wheezing with white or pink blood-tinged mucus, nausea and lack of appetite Reminded to weigh daily - report weight gain of more than 3 lbs in 24 hours or 5 lbs in 1 week. Hyperlipidemia: at goal of < 70 Continue Praulent and rosuvastatin.  Loose stools: Possibly related to not taking potassium with food.  Patient to start taking potassium with food to see if loose stools improve. If not change after 5 to 7 days, patient to call office to see PCP regarding loose stools.  Health Maintenance:  Discussed getting the following vaccines - annual flu, updated COVID - patient is planning to get at her local pharmacy.  Discussed mammogram - per patient she had physical and mammogram at [facility] - requested mammogram results.   Follow Up:  Telephone follow up appointment with care management team member scheduled for:  3 to 4 months.    TaCherre RobinsPharmD Clinical Pharmacist [place] [phone]

## 2021-11-21 ENCOUNTER — Telehealth: Payer: Self-pay | Admitting: *Deleted

## 2021-11-21 NOTE — Telephone Encounter (Signed)
Pt left a message on the refill voicemail regarding Praluent injections.  Returned the call and she said she has received a letter from [employer] regarding authorization expiring in 2 months?  She isn't sure what it means and is asking for a return call regarding it.    Pt aware I will send the PharmD a message to make them aware.  Pt was thankful for the return call.

## 2021-11-21 NOTE — Telephone Encounter (Signed)
Called patient left Dr.Hochrien's advice on personal voice mail.Advised to call back if she has any questions.

## 2021-11-23 ENCOUNTER — Telehealth: Payer: Self-pay | Admitting: Cardiology

## 2021-11-23 NOTE — Telephone Encounter (Signed)
Patient called stating she needs a prior auth for her Alirocumab ([place]) 75 MG/ML SOAJ sent to [facility], it can be faxed to [phone]. If someone can give her a call and let her know when this is done.

## 2021-11-24 NOTE — Telephone Encounter (Signed)
PA sent to the plan on Nov 24, 2021. (Key: [id number])

## 2021-11-27 ENCOUNTER — Encounter: Payer: Self-pay | Admitting: *Deleted

## 2021-11-27 NOTE — Telephone Encounter (Signed)
Insurance requires [place]. PA submitted Key: [id number]

## 2021-11-28 ENCOUNTER — Telehealth: Payer: Self-pay | Admitting: Cardiology

## 2021-11-28 ENCOUNTER — Ambulatory Visit (HOSPITAL_COMMUNITY): Payer: Medicare PPO | Attending: Cardiology

## 2021-11-28 DIAGNOSIS — I5032 Chronic diastolic (congestive) heart failure: Secondary | ICD-10-CM | POA: Diagnosis not present

## 2021-11-28 DIAGNOSIS — R600 Localized edema: Secondary | ICD-10-CM | POA: Diagnosis not present

## 2021-11-28 DIAGNOSIS — R0602 Shortness of breath: Secondary | ICD-10-CM | POA: Insufficient documentation

## 2021-11-28 LAB — ECHOCARDIOGRAM COMPLETE
Area-P 1/2: 2.97 cm2
S' Lateral: 2.4 cm

## 2021-11-28 MED ORDER — PERFLUTREN LIPID MICROSPHERE
2.0000 mL | INTRAVENOUS | Status: AC | PRN
  Administered 2021-11-28: 2 mL via INTRAVENOUS

## 2021-11-28 MED ORDER — REPATHA SURECLICK 140 MG/ML ~~LOC~~ SOAJ: 1.0000 mL | SUBCUTANEOUS | 11 refills | Status: DC

## 2021-11-28 NOTE — Telephone Encounter (Signed)
[employer]atha approved through 11/29/22. Spoke with patient, advised of change to [employer]. Gave her the # to Repatha Ready to get copay card since he has [employer] fund[employer].

## 2021-11-28 NOTE — Telephone Encounter (Signed)
Patient states during her echo today she lost an earring, but a man who was helping her during her appointment advised her that he found the earring, but she forgot to get it while she was there. Patient states she is headed to the office to pick them up now.

## 2021-11-28 NOTE — Addendum Note (Signed)
Addended by: Marcelle Overlie D on: 11/28/2021 01:16 PM   Modules accepted: Orders

## 2021-12-07 ENCOUNTER — Other Ambulatory Visit (HOSPITAL_BASED_OUTPATIENT_CLINIC_OR_DEPARTMENT_OTHER): Payer: Self-pay

## 2021-12-07 MED ORDER — COMIRNATY 30 MCG/0.3ML IM SUSY
PREFILLED_SYRINGE | INTRAMUSCULAR | 0 refills | Status: DC
  Filled 2021-12-07: qty 0.3, 1d supply, fill #0

## 2021-12-07 MED ORDER — FLUAD QUADRIVALENT 0.5 ML IM PRSY
PREFILLED_SYRINGE | INTRAMUSCULAR | 0 refills | Status: DC
  Filled 2021-12-07: qty 0.5, 1d supply, fill #0

## 2021-12-15 DIAGNOSIS — M25512 Pain in left shoulder: Secondary | ICD-10-CM | POA: Diagnosis not present

## 2021-12-15 DIAGNOSIS — M25551 Pain in right hip: Secondary | ICD-10-CM | POA: Diagnosis not present

## 2021-12-21 DIAGNOSIS — M25512 Pain in left shoulder: Secondary | ICD-10-CM | POA: Diagnosis not present

## 2022-01-25 LAB — LAB REPORT - SCANNED
A1c: 6.8
EGFR: 69

## 2022-01-26 DIAGNOSIS — M5432 Sciatica, left side: Secondary | ICD-10-CM | POA: Diagnosis not present

## 2022-01-26 DIAGNOSIS — M79662 Pain in left lower leg: Secondary | ICD-10-CM | POA: Diagnosis not present

## 2022-01-30 DIAGNOSIS — M79605 Pain in left leg: Secondary | ICD-10-CM | POA: Diagnosis not present

## 2022-01-30 DIAGNOSIS — M5432 Sciatica, left side: Secondary | ICD-10-CM | POA: Diagnosis not present

## 2022-01-31 ENCOUNTER — Ambulatory Visit: Payer: Medicare PPO | Admitting: Primary Care

## 2022-02-05 ENCOUNTER — Encounter: Payer: Self-pay | Admitting: *Deleted

## 2022-02-06 ENCOUNTER — Other Ambulatory Visit: Payer: Self-pay

## 2022-02-06 DIAGNOSIS — E1165 Type 2 diabetes mellitus with hyperglycemia: Secondary | ICD-10-CM

## 2022-02-06 MED ORDER — SEMAGLUTIDE (1 MG/DOSE) 4 MG/3ML ~~LOC~~ SOPN
1.0000 mg | PEN_INJECTOR | SUBCUTANEOUS | 3 refills | Status: DC
  Filled 2022-03-08 (×3): qty 3, 28d supply, fill #0
  Filled 2022-04-04: qty 3, 28d supply, fill #1

## 2022-02-07 ENCOUNTER — Encounter (HOSPITAL_COMMUNITY): Payer: Self-pay | Admitting: *Deleted

## 2022-02-07 ENCOUNTER — Other Ambulatory Visit: Payer: Self-pay

## 2022-02-07 ENCOUNTER — Emergency Department (HOSPITAL_COMMUNITY)
Admission: EM | Admit: 2022-02-07 | Discharge: 2022-02-07 | Disposition: A | Discharge status: Home / Self Care | Payer: Medicare PPO | Attending: Emergency Medicine | Admitting: Emergency Medicine

## 2022-02-07 ENCOUNTER — Emergency Department (HOSPITAL_COMMUNITY): Payer: Medicare PPO

## 2022-02-07 DIAGNOSIS — Z7984 Long term (current) use of oral hypoglycemic drugs: Secondary | ICD-10-CM | POA: Insufficient documentation

## 2022-02-07 DIAGNOSIS — M545 Low back pain, unspecified: Secondary | ICD-10-CM | POA: Diagnosis not present

## 2022-02-07 DIAGNOSIS — Z794 Long term (current) use of insulin: Secondary | ICD-10-CM | POA: Diagnosis not present

## 2022-02-07 DIAGNOSIS — E119 Type 2 diabetes mellitus without complications: Secondary | ICD-10-CM | POA: Diagnosis not present

## 2022-02-07 DIAGNOSIS — Z7901 Long term (current) use of anticoagulants: Secondary | ICD-10-CM | POA: Insufficient documentation

## 2022-02-07 DIAGNOSIS — M25552 Pain in left hip: Secondary | ICD-10-CM | POA: Diagnosis not present

## 2022-02-07 DIAGNOSIS — M5442 Lumbago with sciatica, left side: Secondary | ICD-10-CM | POA: Insufficient documentation

## 2022-02-07 LAB — URINALYSIS, ROUTINE W REFLEX MICROSCOPIC
Bilirubin Urine: NEGATIVE
Glucose, UA: NEGATIVE mg/dL
Hgb urine dipstick: NEGATIVE
Ketones, ur: NEGATIVE mg/dL
Nitrite: NEGATIVE
Protein, ur: 30 mg/dL — AB
Specific Gravity, Urine: 1.024 (ref 1.005–1.030)
pH: 5 (ref 5.0–8.0)

## 2022-02-07 MED ORDER — METHOCARBAMOL 500 MG PO TABS
500.0000 mg | ORAL_TABLET | Freq: Once | ORAL | Status: AC
  Administered 2022-02-07: 500 mg via ORAL
  Filled 2022-02-07: qty 1

## 2022-02-07 MED ORDER — PREDNISONE 10 MG PO TABS: 40.0000 mg | ORAL_TABLET | Freq: Every day | ORAL | 0 refills | Status: AC

## 2022-02-07 NOTE — ED Provider Triage Note (Signed)
Emergency Medicine Provider Triage Evaluation Note  Sierra Ford , a 81 y.o. female  was evaluated in triage.  Pt complains of back pain and lower hip pain.  Denies any falls.  Endorses that the pain feels like "hitting her funny bone".  Reports pain with getting up and movement.  She has still been able to ambulate though with difficulty due to pain.  She denies any urinary symptoms.  Denies any loss of bowel or bladder control.  Has taken Tylenol with little relief.  Review of Systems  Positive: As above Negative: As above  Physical Exam  BP 126/61 (BP Location: Right Arm)   Pulse 80   Temp 98.3 F (36.8 C) (Oral)   Resp 16   Ht '5\' 2"'$  (1.575 m)   Wt 79.4 kg   SpO2 94%   BMI 32.01 kg/m  Gen:   Awake, no distress   Resp:  Normal effort  MSK:   5/5 strength in bilateral lower extremities.  Sensations intact.  No visible leg shortening or deformities.  Tenderness to the midline lumbar spine and left flank/SI joint. Other:    Medical Decision Making  Medically screening exam initiated at 9:22 AM.  Appropriate orders placed.  LAMANDA RUDDER was informed that the remainder of the evaluation will be completed by another provider, this initial triage assessment does not replace that evaluation, and the importance of remaining in the ED until their evaluation is complete.     Garald Balding, PA-C 02/07/22 [phone]

## 2022-02-07 NOTE — ED Provider Notes (Cosign Needed Addendum)
[facility] DEPT Provider Note   CSN: [id number] Arrival date & time: 02/07/22  0830     History  No chief complaint on file.   Sierra Ford is a 81 y.o. female. With a history of diabetes, hyperlipidemia, depression, anxiety, chronic back pain, sleep apnea who presents to the ED for evaluation of atraumatic left sided low back pain. Symptoms have been present for 3 weeks and have acutely worsened over the past week. Currently has saddle paresthesia and had an episode of fecal incontinence while at a movie theater on Sunday. Symptoms are worsened with back extension. Pain radiates down the posterior of her L leg. States it feels tight and similar to hitting her funny bone. Feelings end at the posterior of the knee. Has had to use a cane for the past 2 weeks. Was given gabapentin by her primary care provider one week ago and states it has provided no relief. Denies urinary incontinence, fevers, dysuria, constipation, paresthesias of the lower legs.  HPI     Home Medications Prior to Admission medications   Medication Sig Start Date End Date Taking? Authorizing Provider  apixaban (ELIQUIS) 2.5 MG TABS tablet Take 1 tablet (2.5 mg total) by mouth 2 (two) times daily. 07/18/21  Yes Minus Breeding, MD  Calcium Carb-Cholecalciferol (CALCIUM + D3 PO) Take 1 tablet by mouth daily.   Yes [provider]  cholecalciferol (VITAMIN D3) 10 MCG (400 UNIT) TABS tablet Take 400 Units by mouth 2 (two) times daily.   Yes [provider]  Coenzyme Q10 100 MG TABS Take 100 mg by mouth daily.   Yes [provider]  Evolocumab (REPATHA SURECLICK) 035 MG/ML SOAJ Inject 140 mg into the skin every 14 (fourteen) days. 11/28/21  Yes Hochrein, Jeneen Rinks, MD  Flaxseed, Linseed, (FLAXSEED OIL PO) Take 1,000 mg by mouth daily.    Yes [provider]  furosemide (LASIX) 40 MG tablet TAKE 1 TABLET DAILY Patient taking differently: Take 40 mg by mouth in  the morning. 01/30/21  Yes Minus Breeding, MD  gabapentin (NEURONTIN) 300 MG capsule Take 300 mg by mouth at bedtime.   Yes [provider]  hydrocortisone (ANUSOL-HC) 25 MG suppository Place 1 suppository (25 mg total) rectally every 12 (twelve) hours. Patient taking differently: Place 25 mg rectally 2 (two) times daily as needed for hemorrhoids. 08/04/21  Yes Ann Held, DO  Multiple Vitamin (MULTIVITAMIN) capsule Take 1 capsule by mouth daily with breakfast.   Yes [provider]  Omega-3 Fatty Acids (FISH OIL) 1000 MG CAPS Take 1,000 mg by mouth daily.   Yes [provider]  potassium chloride SA (KLOR-CON M) 20 MEQ tablet Take 1 tablet (20 mEq total) by mouth daily. 10/26/21  Yes Finis Bud, NP  rosuvastatin (CRESTOR) 10 MG tablet Take 1 tablet (10 mg total) by mouth daily. 09/04/21  Yes Minus Breeding, MD  Semaglutide, 1 MG/DOSE, 4 MG/3ML SOPN Inject 1 mg as directed once a week. Patient taking differently: Inject 1 mg as directed every Tuesday. 02/06/22  Yes Roma Schanz R, DO  blood glucose meter kit and supplies KIT Dispense based on patient and insurance preference. Use up to four times daily as directed. (FOR ICD-9 250.00, 250.01). 04/08/17   Carollee Herter, Alferd Apa, DO  COVID-19 mRNA vaccine [phone] (COMIRNATY) syringe Inject into the muscle. Patient not taking: Reported on 02/07/2022 12/07/21   Carlyle Basques, MD  influenza vaccine adjuvanted (FLUAD QUADRIVALENT) 0.5 ML injection Inject into the  muscle. Patient not taking: Reported on 02/07/2022 12/07/21   Carlyle Basques, MD  Insulin Pen Needle (NOVOFINE PLUS PEN NEEDLE) 32G X 4 MM MISC As directed 05/10/20   Carollee Herter, Alferd Apa, DO  TRUE METRIX BLOOD GLUCOSE TEST test strip TEST  UP  TO FOUR TIMES DAILY AS DIRECTED 12/05/17   Carollee Herter, Alferd Apa, DO  TRUEPLUS LANCETS 33G MISC TEST  UP TO FOUR TIMES DAILY AS DIRECTED 12/05/17   Ann Held, DO  omega-3 acid ethyl esters  (LOVAZA) 1 G capsule Take 1 g by mouth 3 (three) times daily.    05/07/11  [provider]  pravastatin (PRAVACHOL) 20 MG tablet Take 20 mg by mouth daily.    02/04/19  [provider]      Allergies    Patient has no known allergies.    Review of Systems   Review of Systems  Musculoskeletal:  Positive for back pain.  All other systems reviewed and are negative.   Physical Exam Updated Vital Signs BP (!) 127/99   Pulse 72   Temp 98.4 F (36.9 C) (Oral)   Resp 20   Ht _0  (1.575 m)   Wt 79.4 kg   SpO2 98%   BMI 32.01 kg/m  Physical Exam Vitals and nursing note reviewed.  Constitutional:      General: She is not in acute distress.    Appearance: Normal appearance. She is well-developed. She is obese. She is not ill-appearing, toxic-appearing or diaphoretic.  HENT:     Head: Normocephalic and atraumatic.  Eyes:     Conjunctiva/sclera: Conjunctivae normal.  Cardiovascular:     Rate and Rhythm: Normal rate and regular rhythm.     Pulses:          Posterior tibial pulses are 1+ on the right side and 1+ on the left side.     Heart sounds: No murmur heard. Pulmonary:     Effort: Pulmonary effort is normal. No respiratory distress.     Breath sounds: Normal breath sounds.  Abdominal:     Palpations: Abdomen is soft.     Tenderness: There is no abdominal tenderness.  Musculoskeletal:        General: No swelling. Normal range of motion.     Cervical back: Neck supple.     Comments: Positive straight leg raise on the R  Skin:    General: Skin is warm and dry.     Capillary Refill: Capillary refill takes less than 2 seconds.  Neurological:     General: No focal deficit present.     Mental Status: She is alert and oriented to person, place, and time.  Psychiatric:        Mood and Affect: Mood normal.     ED Results / Procedures / Treatments   Labs (all labs ordered are listed, but only abnormal results are displayed) Labs Reviewed  URINALYSIS,  ROUTINE W REFLEX MICROSCOPIC - Abnormal; Notable for the following components:      Result Value   Color, Urine AMBER (*)    APPearance HAZY (*)    Protein, ur 30 (*)    Leukocytes,Ua MODERATE (*)    Bacteria, UA RARE (*)    All other components within normal limits    EKG None  Radiology MR LUMBAR SPINE WO CONTRAST  Result Date: 02/07/2022 CLINICAL DATA:  Low back pain and left hip pain EXAM: MRI LUMBAR SPINE WITHOUT CONTRAST TECHNIQUE: Multiplanar, multisequence MR imaging of the  lumbar spine was performed. No intravenous contrast was administered. COMPARISON:  05/23/2017 MRI lumbar spine FINDINGS: Segmentation:  5 lumbar type vertebral bodies. Alignment: 4 mm anterolisthesis of L3 on L4, unchanged. Mild dextrocurvature Vertebrae:  No fracture, evidence of discitis, or bone lesion. Conus medullaris and cauda equina: Conus extends to the L1 level. Conus and cauda equina appear normal. Paraspinal and other soft tissues: No acute finding. Disc levels: T12-L1: No significant disc bulge. No spinal canal stenosis or neural foraminal narrowing. L1-L2: No significant disc bulge. No spinal canal stenosis or neural foraminal narrowing. L2-L3: Minimal disc bulge. Mild-to-moderate right and mild left facet arthropathy. Ligamentum flavum hypertrophy. Mild spinal canal stenosis, slightly progressed from the prior exam. No neural foraminal narrowing. L3-L4: Grade 1 anterolisthesis with disc unroofing. Severe facet arthropathy. Ligamentum flavum hypertrophy. Unchanged severe spinal canal stenosis. Narrowing of the lateral recesses. Mild bilateral neural foraminal narrowing, unchanged. L4-L5: Mild disc bulge with increased right subarticular, foraminal, and extreme lateral disc protrusion, which likely contacts the exiting right L4 nerve. In addition, there is a new left foraminal protrusion. Effacement of the right lateral recess. Narrowing of the left lateral recess. Mild to moderate spinal canal stenosis, which  has progressed from the prior exam. Mild bilateral neural foraminal narrowing, similar to prior. L5-S1: Mild disc bulge. Moderate to severe right and mild left facet arthropathy. No spinal canal stenosis or neural foraminal narrowing. IMPRESSION: 1. L3-L4 severe spinal canal stenosis and mild bilateral neural foraminal narrowing, unchanged. Narrowing of the lateral recesses at this level could affect the descending L4 nerve roots. 2. L4-L5 mild-to-moderate spinal canal stenosis, which has progressed from the prior exam, and mild bilateral neural foraminal narrowing. Effacement of the right lateral recess at this level likely compresses the descending right L5 nerve roots. Narrowing of the left lateral recess at this level could affect the descending left L5 nerve roots. 3. L2-L3 mild spinal canal stenosis, slightly progressed from the prior exam. Electronically Signed   By: Merilyn Baba M.D.   On: 02/07/2022 22:12   DG HIP UNILAT WITH PELVIS 2-3 VIEWS LEFT  Result Date: 02/07/2022 CLINICAL DATA:  81 year old female with back and left hip pain for 3 weeks. No known injury. EXAM: DG HIP (WITH OR WITHOUT PELVIS) 2-3V LEFT COMPARISON:  Bilateral hip series 09/04/2021. FINDINGS: AP pelvis, AP and frog-leg views of the left hip. Chronic bilateral axial hip joint space loss, subchondral sclerosis, acetabular and femoral head degenerative spurring. Changes are perhaps more advanced on the left. But the pelvis and proximal left femur appear intact. Normal background bone mineralization. Normal SI joints. No acute osseous abnormality identified. Negative lower abdominal and pelvic visceral contours. IMPRESSION: Moderately advanced bilateral hip osteoarthritis. No acute osseous abnormality identified. Electronically Signed   By: Genevie Ann M.D.   On: 02/07/2022 10:07   DG Lumbar Spine Complete  Result Date: 02/07/2022 CLINICAL DATA:  Back pain EXAM: LUMBAR SPINE - COMPLETE 4+ VIEW COMPARISON:  Lumbar MRI 05/23/2017  FINDINGS: No evidence of acute fracture or aggressive bone lesion. Lumbar spine degeneration with diffuse disc space narrowing and degenerative facet spurring. Mild L3-4 anterolisthesis. Bilateral hip osteoarthritis with prominent axial joint space narrowing on the left. IMPRESSION: 1. No acute finding. 2. 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 Advanced spinal stenosis at this level by 2019 MRI. 3. Advanced hip osteoarthritis. Electronically Signed   By: Jorje Guild M.D.   On: 02/07/2022 10:07    Procedures Procedures    Medications Ordered in ED Medications - No  data to display  ED Course/ Medical Decision Making/ A&P Clinical Course as of 02/07/22 2217  Wed Feb 07, 2022  2207 81YOF with chronic low back pain. Worsening last 3 weeks. Sciatic nerve pain. Endorsing fecal incontinence, saddle anesthesia. MR to rule out cauda equina. NV intact, no pain. If negative, dose of prednisone 40 4 days. If positive, consult neurosurgery.  [CG]    Clinical Course User Index [CG] Azucena Cecil, PA-C                           Medical Decision Making Amount and/or Complexity of Data Reviewed Radiology: ordered.  This patient presents to the ED for concern of low back pain, this involves an extensive number of treatment options, and is a complaint that carries with it a high risk of complications and morbidity. The emergent differential diagnosis for back pain includes but is not limited to fracture, muscle strain, cauda equina, spinal stenosis. DDD, ankylosing spondylitis, acute ligamentous injury, disk herniation, spondylolisthesis, Epidural compression syndrome, metastatic cancer, transverse myelitis, vertebral osteomyelitis, diskitis, kidney stone, pyelonephritis, AAA, Perforated ulcer, Retrocecal appendicitis, pancreatitis, bowel obstruction, retroperitoneal hemorrhage or mass, meningitis.    Co morbidities that complicate the patient evaluation  diabetes, hyperlipidemia,  depression, anxiety, chronic back pain, sleep apnea  My initial workup includes x ray hips, lumbar spine, MRI lumbar spine  Additional history obtained from: Nursing notes from this visit. Previous records within EMR system office visit to [facility] on 01/26/2022 for low back pain  I ordered, reviewed and interpreted labs which include: urinalysis   I ordered imaging studies including x ray hips and lumbar spine, MRI lumbar spine I independently visualized and interpreted imaging which showed no cauda equina, does show numerous areas of stenosis. I agree with the radiologist interpretation  Afebrile, hemodynamically stable.  81 year old female presents ED for evaluation of low back pain with radiation into the posterior left leg.  She does have chronic back pain, however it is considerably worse over the last 3 weeks.  Has had to use a cane to ambulate recently.  Does report current saddle paresthesias and a recent episode of fecal incontinence.  She does have positive straight leg raise on the left side.  The remainder of her neurovascular status is intact.  She did ambulate to the ED.  MRI will be obtained to rule out cauda equina. MRI negative for cauda equina. Does show numerous areas of stenosis and foraminal narrowing. This is likely the cause of her symptoms. She follows with orthopedics for her chronic back pain. She will follow up regarding her MRI results. Will be given a prednisone burst for symptoms. Return precautions were discussed. Stable at the time of discharge.  At this time there does not appear to be any evidence of an acute emergency medical condition and the patient appears stable for discharge with appropriate outpatient follow up. Diagnosis was discussed with patient who verbalizes understanding of care plan and is agreeable to discharge. I have discussed return precautions with patient who verbalizes understanding. Patient encouraged to follow-up with their PCP within 1  week. All questions answered.  Patient's case discussed with Dr. Melina Copa who agrees with plan to discharge with orthopedic follow up.   Note: Portions of this report may have been transcribed using voice recognition software. Every effort was made to ensure accuracy; however, inadvertent computerized transcription errors may still be present.   Final Clinical Impression(s) / ED Diagnoses Final diagnoses:  Acute  left-sided low back pain with left-sided sciatica    Rx / DC Orders ED Discharge Orders     None         Roylene Reason, Hershal Coria 02/07/22 2214    Roylene Reason, PA-C 02/07/22 2229    Hayden Rasmussen, MD 02/08/22 [phone]

## 2022-02-07 NOTE — Discharge Instructions (Addendum)
You have been seen today for your complaint of low back pain. Your imaging results can be found on your MyChart. Your discharge medications include prednisone. This is a steroid. Take it as prescribed. Follow up with: orthopedics regarding your MRI results. Please seek immediate medical care if you develop any of the following symptoms: You are not able to control when you urinate or have bowel movements (incontinence). You have: Weakness in your lower back, pelvis, buttocks, or legs that gets worse. Redness or swelling of your back. A burning sensation when you urinate. At this time there does not appear to be the presence of an emergent medical condition, however there is always the potential for conditions to change. Please read and follow the below instructions.  Do not take your medicine if  develop an itchy rash, swelling in your mouth or lips, or difficulty breathing; call 911 and seek immediate emergency medical attention if this occurs.  You may review your lab tests and imaging results in their entirety on your MyChart account.  Please discuss all results of fully with your primary care provider and other specialist at your follow-up visit.  Note: Portions of this text may have been transcribed using voice recognition software. Every effort was made to ensure accuracy; however, inadvertent computerized transcription errors may still be present.

## 2022-02-07 NOTE — ED Triage Notes (Signed)
Left leg pain for a few weeks, getting worse, difficult to walk or get up and down, keeps her awake at night.

## 2022-02-13 ENCOUNTER — Encounter: Payer: Self-pay | Admitting: *Deleted

## 2022-02-13 ENCOUNTER — Telehealth: Payer: Self-pay | Admitting: *Deleted

## 2022-02-13 NOTE — Patient Outreach (Addendum)
  Care Coordination [facility] Note Transition Care Management Follow-up Telephone Call Date of discharge and from where: ED visit, 02/07/22 [place] Ford; worsening chronic (L) back pain with sciatica ED EMMI Red Alert notification: "No scheduled follow up appointment" How have you been since you were released from the hospital? "I am hanging in there; I still have this pain, and I took the prednisone they gave me for 4 days; I have made an appointment to see Dr. Teresita Madura PA to hopefully get some kind of answers/ help.  I hope they can make me a referral to a neurosurgeon, or somewhere that can help me get rid of this terrible pain.  I am able to drive myself to the appointment but if I feel terrible tomorrow, my daughter can take me.  My family is here at my house now and we are celebrating a second Christmas"" Any questions or concerns? No  Items Reviewed: Did the pt receive and understand the discharge instructions provided? Yes  Medications obtained and verified? Yes  Other? No  Any new allergies since your discharge? No  Dietary orders reviewed? Yes Do you have support at home? Yes  reports has several local family members that live nearby and can assist as/if needed; reports she lives alone and is independent in self-care  Home Care and Equipment/Supplies: Were home health services ordered? no If so, what is the name of the agency? N/A  Has the agency set up a time to come to the patient's home? not applicable Were any new equipment or medical supplies ordered?  No What is the name of the medical supply agency? N/A Were you able to get the supplies/equipment? not applicable Do you have any questions related to the use of the equipment or supplies? No N/A  Functional Questionnaire: (I = Independent and D = Dependent) ADLs: I  Bathing/Dressing- I  Meal Prep- I  Eating- I  Maintaining continence- I  Transferring/Ambulation- I  Managing Meds- I  Follow up appointments  reviewed:  PCP Hospital f/u appt confirmed? Yes  Scheduled to see PA Ed Saguier/ PCP on Wednesday 02/14/22 @ 4:00 pm Specialist Hospital f/u appt confirmed? No  Scheduled to see - on - @ - Are transportation arrangements needed? No  If their condition worsens, is the pt aware to call PCP or go to the Emergency Dept.? Yes Was the patient provided with contact information for the PCP's office or ED? No- patient declined reports already has contact information for all care providers Was to pt encouraged to call back with questions or concerns? Yes  SDOH assessments and interventions completed:   Yes SDOH Interventions Today    Flowsheet Row Most Recent Value  SDOH Interventions   Food Insecurity Interventions Intervention Not Indicated  Transportation Interventions Intervention Not Indicated  [drives self,  family assists as indicated]      Care Coordination Interventions:  Reviewed purpose of and dosing of recently prescribed prednisone; confirmed patient has scheduled post-ED PCP appointment    Encounter Outcome:  Pt. Visit Completed    Oneta Rack, RN, BSN, CCRN Alumnus RN CM Care Coordination/ Transition of [place] Management [phone]: direct office

## 2022-02-14 ENCOUNTER — Ambulatory Visit (INDEPENDENT_AMBULATORY_CARE_PROVIDER_SITE_OTHER): Payer: Medicare PPO | Admitting: Medical

## 2022-02-14 VITALS — BP 118/64 | HR 97 | Temp 98.0°F | Resp 18 | Ht 62.0 in | Wt 172.0 lb

## 2022-02-14 DIAGNOSIS — M48061 Spinal stenosis, lumbar region without neurogenic claudication: Secondary | ICD-10-CM | POA: Diagnosis not present

## 2022-02-14 DIAGNOSIS — M25562 Pain in left knee: Secondary | ICD-10-CM | POA: Diagnosis not present

## 2022-02-14 DIAGNOSIS — M25552 Pain in left hip: Secondary | ICD-10-CM | POA: Diagnosis not present

## 2022-02-14 DIAGNOSIS — M5440 Lumbago with sciatica, unspecified side: Secondary | ICD-10-CM | POA: Diagnosis not present

## 2022-02-14 MED ORDER — METHYLPREDNISOLONE 4 MG PO TABS: ORAL_TABLET | ORAL | 0 refills | Status: DC

## 2022-02-14 NOTE — Progress Notes (Signed)
Subjective:    Patient ID: Sierra Ford, female    DOB: 20-Oct-1940, 81 y.o.   MRN: [id number]  HPI Pt has one month of lower lumbar, si area pain, hip pain and some pain in left thigh and knee.     Imaging studies 02-07-22.  Pt had mri   MPRESSION: 1. L3-L4 severe spinal canal stenosis and mild bilateral neural foraminal narrowing, unchanged. Narrowing of the lateral recesses at this level could affect the descending L4 nerve roots. 2. L4-L5 mild-to-moderate spinal canal stenosis, which has progressed from the prior exam, and mild bilateral neural foraminal narrowing. Effacement of the right lateral recess at this level likely compresses the descending right L5 nerve roots. Narrowing of the left lateral recess at this level could affect the descending left L5 nerve roots. 3. L2-L3 mild spinal canal stenosis, slightly progressed from the prior exam.  Hip xray  IMPRESSION: Moderately advanced bilateral hip osteoarthritis. No acute osseous abnormality identified.    Lumbar xray-  IMPRESSION: 1. No acute finding. 2. 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 Advanced spinal stenosis at this level by 2019 MRI. 3. Advanced hip osteoarthritis.  She had prednisone 40 mg x 4 days and then gabapentin 300 mg at night. She states when used prednisone her symptoms improved but then came back as finished the course of illness.    Pt drove herself here. I was informed pt can't drive well at night so finished as quickly as possible with pt before [facility].    Review of Systems  Constitutional:  Negative for chills, fatigue and fever.  Respiratory:  Negative for cough, chest tightness, shortness of breath and wheezing.   Cardiovascular:  Negative for chest pain and palpitations.  Gastrointestinal:  Negative for abdominal pain, blood in stool and diarrhea.  Musculoskeletal:        See hpi.  Hematological:  Negative for adenopathy. Does not bruise/bleed easily.   Psychiatric/Behavioral:  Negative for confusion and decreased concentration.    Past Medical History:  Diagnosis Date   Anxiety    Back pain    Chest pain    CLite with apical ischemia in 2006 - normal coronary arteries by [facility] in 12/2004;  Myoview 11/12:  Low risk stress nuclear study with a small, partially reversible apical defect most likely related to apical thinning; cannot R/O very mild apical ischemia.  EF: 75%    Decreased hearing    Depression    Diabetes ([place])    DVT (deep venous thrombosis) ([place]) 2006   hx of   Ectopic pregnancy with intrauterine pregnancy    Hiatal hernia    Hyperlipidemia    PE (pulmonary embolism) 2006   hx of   Sleep apnea    CPAP      Social History   Socioeconomic History   Marital status: Divorced    Spouse name: Not on file   Number of children: Not on file   Years of education: Not on file   Highest education level: Not on file  Occupational History   Occupation: Retired Scientist, research (medical)  Tobacco Use   Smoking status: Former    Packs/day: 1.00    Years: 50.00    Total pack years: 50.00    Types: Cigarettes    Quit date: 11/21/2009    Years since quitting: 12.2   Smokeless tobacco: Never  Vaping Use   Vaping Use: Never used  Substance and Sexual Activity   Alcohol use: Yes    Comment: rare 3  times a year per pt   Drug use: No   Sexual activity: Not Currently    Partners: Male  Other Topics Concern   Not on file  Social History Narrative   Regular exercise--- no   Social Determinants of Health   Financial Resource Strain: Medium Risk (03/20/2021)   Overall Financial Resource Strain (CARDIA)    Difficulty of Paying Living Expenses: Somewhat hard  Food Insecurity: No Food Insecurity (02/13/2022)   Hunger Vital Sign    Worried About Running Out of Food in the Last Year: Never true    Ran Out of Food in the Last Year: Never true  Transportation Needs: No Transportation Needs (02/13/2022)   PRAPARE - Radiographer, therapeutic (Medical): No    Lack of Transportation (Non-Medical): No  Physical Activity: Sufficiently Active (08/15/2020)   Exercise Vital Sign    Days of Exercise per Week: 4 days    Minutes of Exercise per Session: 60 min  Stress: No Stress Concern Present (08/15/2020)   [place]    Feeling of Stress : Only a little  Social Connections: Moderately Integrated (08/15/2020)   Social Connection and Isolation Panel [NHANES]    Frequency of Communication with Friends and Family: More than three times a week    Frequency of Social Gatherings with Friends and Family: More than three times a week    Attends Religious Services: More than 4 times per year    Active Member of [employer] or Organizations: Yes    Attends Archivist Meetings: 1 to 4 times per year    Marital Status: Widowed  Intimate Partner Violence: Not At Risk (08/15/2020)   Humiliation, Afraid, Rape, and Kick questionnaire    Fear of Current or Ex-Partner: No    Emotionally Abused: No    Physically Abused: No    Sexually Abused: No    Past Surgical History:  Procedure Laterality Date   ABDOMINAL EXPLORATION SURGERY     BUNIONECTOMY     right   COLONOSCOPY  2009   ECTOPIC PREGNANCY SURGERY     FOOT SURGERY     TUBAL LIGATION      Family History  Problem Relation Age of Onset   Heart failure Mother        died from   Colon polyps Mother    Hypertension Mother    Sudden death Mother    Obesity Mother    Alcoholism Father    Diabetes Maternal Aunt        grandaughter   Diabetes Maternal Aunt    Colon cancer Neg Hx    Esophageal cancer Neg Hx    Stomach cancer Neg Hx     No Known Allergies  Current Outpatient Medications on File Prior to Visit  Medication Sig Dispense Refill   apixaban (ELIQUIS) 2.5 MG TABS tablet Take 1 tablet (2.5 mg total) by mouth 2 (two) times daily. 60 tablet 5   blood glucose meter kit and supplies KIT Dispense  based on patient and insurance preference. Use up to four times daily as directed. (FOR ICD-9 250.00, 250.01). 1 each 0   Calcium Carb-Cholecalciferol (CALCIUM + D3 PO) Take 1 tablet by mouth daily.     cholecalciferol (VITAMIN D3) 10 MCG (400 UNIT) TABS tablet Take 400 Units by mouth 2 (two) times daily.     Coenzyme Q10 100 MG TABS Take 100 mg by mouth daily.  COVID-19 mRNA vaccine 2023-2024 (COMIRNATY) syringe Inject into the muscle. 0.3 mL 0   Evolocumab (REPATHA SURECLICK) 914 MG/ML SOAJ Inject 140 mg into the skin every 14 (fourteen) days. 2 mL 11   Flaxseed, Linseed, (FLAXSEED OIL PO) Take 1,000 mg by mouth daily.      furosemide (LASIX) 40 MG tablet TAKE 1 TABLET DAILY (Patient taking differently: Take 40 mg by mouth in the morning.) 90 tablet 2   gabapentin (NEURONTIN) 300 MG capsule Take 300 mg by mouth at bedtime.     hydrocortisone (ANUSOL-HC) 25 MG suppository Place 1 suppository (25 mg total) rectally every 12 (twelve) hours. (Patient taking differently: Place 25 mg rectally 2 (two) times daily as needed for hemorrhoids.) 12 suppository 1   influenza vaccine adjuvanted (FLUAD QUADRIVALENT) 0.5 ML injection Inject into the muscle. 0.5 mL 0   Insulin Pen Needle (NOVOFINE PLUS PEN NEEDLE) 32G X 4 MM MISC As directed 100 each 1   Multiple Vitamin (MULTIVITAMIN) capsule Take 1 capsule by mouth daily with breakfast.     Omega-3 Fatty Acids (FISH OIL) 1000 MG CAPS Take 1,000 mg by mouth daily.     potassium chloride SA (KLOR-CON M) 20 MEQ tablet Take 1 tablet (20 mEq total) by mouth daily. 90 tablet 3   rosuvastatin (CRESTOR) 10 MG tablet Take 1 tablet (10 mg total) by mouth daily. 90 tablet 2   Semaglutide, 1 MG/DOSE, 4 MG/3ML SOPN Inject 1 mg as directed once a week. (Patient taking differently: Inject 1 mg as directed every Tuesday.) 3 mL 3   TRUE METRIX BLOOD GLUCOSE TEST test strip TEST  UP  TO FOUR TIMES DAILY AS DIRECTED 200 each 12   TRUEPLUS LANCETS 33G MISC TEST  UP TO FOUR  TIMES DAILY AS DIRECTED 200 each 12   [DISCONTINUED] omega-3 acid ethyl esters (LOVAZA) 1 G capsule Take 1 g by mouth 3 (three) times daily.       [DISCONTINUED] pravastatin (PRAVACHOL) 20 MG tablet Take 20 mg by mouth daily.       No current facility-administered medications on file prior to visit.    BP 118/64   Pulse 97   Temp 98 F (36.7 C)   Resp 18   Ht _0  (1.575 m)   Wt 172 lb (78 kg)   SpO2 98%   BMI 31.46 kg/m        Objective:   Physical Exam   General- No acute distress. Pleasant patient. Neck- Full range of motion, no jvd Lungs- Clear, even and unlabored. Heart- regular rate and rhythm. Neurologic- CNII- XII grossly intact.  Back-mild mid lumbar tenderness to palpation.  Left SI tenderness as well. Left hip-tenderness to palpation and even slight range of motion. Left knee-minimal pain to palpation anterior aspect.  No obvious crepitus on flexion and extension. Left calf nontender to palpation.  Is not swollen.  Negative Homans' sign.     Assessment & Plan:   Patient Instructions  For left hip pain, sciatica and spinal stenosis will rx medrol 6 day taper course and use gabapentin 300 mg at night. But keep in mind can use up to 600 mg at night. Rx advisement today.  Will refer to sports medicine as that woud be quickest referral and other speciialist if needed.  Some left knee pain as well but this is likely associated with the left hip pain.  Did place x-ray holder of the left knee but can get that later as you do express need to  get home before he gets to Dr.  Follow up with me in 10 days if needed.

## 2022-02-14 NOTE — Patient Instructions (Addendum)
For left hip pain, sciatica and spinal stenosis will rx medrol 6 day taper course and use gabapentin 300 mg at night. But keep in mind can use up to 600 mg at night. Rx advisement today.  Will refer to sports medicine as that woud be quickest referral and other speciialist if needed.  Some left knee pain as well but this is likely associated with the left hip pain.  Did place x-ray holder of the left knee but can get that later as you do express need to get home before he gets to Dr.  Follow up with me in 10 days if needed.

## 2022-02-20 ENCOUNTER — Ambulatory Visit: Payer: Self-pay

## 2022-02-20 ENCOUNTER — Encounter: Payer: Self-pay | Admitting: Family Medicine

## 2022-02-20 ENCOUNTER — Ambulatory Visit (INDEPENDENT_AMBULATORY_CARE_PROVIDER_SITE_OTHER): Payer: Medicare PPO | Admitting: Family Medicine

## 2022-02-20 VITALS — BP 130/82 | Ht 62.0 in | Wt 172.0 lb

## 2022-02-20 DIAGNOSIS — M1612 Unilateral primary osteoarthritis, left hip: Secondary | ICD-10-CM

## 2022-02-20 DIAGNOSIS — M12812 Other specific arthropathies, not elsewhere classified, left shoulder: Secondary | ICD-10-CM

## 2022-02-20 DIAGNOSIS — M12811 Other specific arthropathies, not elsewhere classified, right shoulder: Secondary | ICD-10-CM

## 2022-02-20 DIAGNOSIS — M48061 Spinal stenosis, lumbar region without neurogenic claudication: Secondary | ICD-10-CM

## 2022-02-20 MED ORDER — TRIAMCINOLONE ACETONIDE 40 MG/ML IJ SUSP
40.0000 mg | Freq: Once | INTRAMUSCULAR | Status: AC
  Administered 2022-02-20: 40 mg via INTRA_ARTICULAR

## 2022-02-20 NOTE — Assessment & Plan Note (Signed)
Acutely occurring.  Having bilateral shoulder pain.  Has decent range of motion and reports a history of repeated steroid injections. -Counseled on home exercise therapy and supportive care. -Referral to physical therapy. -Could consider injection.

## 2022-02-20 NOTE — Patient Instructions (Signed)
Nice to meet you Please use ice as needed We have made a referral to physical therapy  Please alternate heat and ice   Please stop the prednisone  Please send me a message in MyChart with any questions or updates.  Please see me back in 4-6 weeks.   --Dr. Raeford Razor

## 2022-02-20 NOTE — Progress Notes (Signed)
Sierra Ford - 82 y.o. female MRN [id number]  Date of birth: [birth year]  SUBJECTIVE:  Including CC & ROS.  No chief complaint on file.   Sierra Ford is a 82 y.o. female that is presenting with acute left-sided hip pain.  The pain is severe in nature.  No history of similar pain or surgery.  No history of injury or inciting event.  Having to limp with walking.  She is also having bilateral shoulder pain.  Endorses a component of radicular pain down the left leg.  Independent review of the lumbar spine x-ray from 12/20 shows facet arthrosis Independent review of AP pelvis from 12/20 shows degenerative changes of the left and right hip with the left being worse than the right.  Review of Systems See HPI   HISTORY: Past Medical, Surgical, Social, and Family History Reviewed & Updated per EMR.   Pertinent Historical Findings include:  Past Medical History:  Diagnosis Date   Anxiety    Back pain    Chest pain    CLite with apical ischemia in 2006 - normal coronary arteries by [facility] in 12/2004;  Myoview 11/12:  Low risk stress nuclear study with a small, partially reversible apical defect most likely related to apical thinning; cannot R/O very mild apical ischemia.  EF: 75%    Decreased hearing    Depression    Diabetes ([place])    DVT (deep venous thrombosis) ([place]) 2006   hx of   Ectopic pregnancy with intrauterine pregnancy    Hiatal hernia    Hyperlipidemia    PE (pulmonary embolism) 2006   hx of   Sleep apnea    CPAP     Past Surgical History:  Procedure Laterality Date   ABDOMINAL EXPLORATION SURGERY     BUNIONECTOMY     right   COLONOSCOPY  2009   ECTOPIC PREGNANCY SURGERY     FOOT SURGERY     TUBAL LIGATION       PHYSICAL EXAM:  VS: BP 130/82 (BP Location: Left Arm, Patient Position: Sitting)   Ht '5\' 2"'$  (1.575 m)   Wt 172 lb (78 kg)   BMI 31.46 kg/m  Physical Exam Gen: NAD, alert, cooperative with exam, well-appearing MSK:  Neurovascularly intact      Aspiration/Injection Procedure Note Sierra Ford 1940/07/13  Procedure: Injection Indications: Left hip pain  Procedure Details Consent: Risks of procedure as well as the alternatives and risks of each were explained to the (patient/caregiver).  Consent for procedure obtained. Time Out: Verified patient identification, verified procedure, site/side was marked, verified correct patient position, special equipment/implants available, medications/allergies/relevent history reviewed, required imaging and test results available.  Performed.  The area was cleaned with iodine and alcohol swabs.    The left hip joint was injected with 3 cc of 1% lidocaine on a 22-gauge 3-1/2 inch needle.  The syringe was switched and a mixture containing 1 cc of 6 mg betamethasone and 4 cc of 0.25% bupivacaine was injected.  Ultrasound was used. Images were obtained in long views showing the injection.     A sterile dressing was applied.  Patient did tolerate procedure well.     ASSESSMENT & PLAN:   Primary osteoarthritis of left hip Acutely occurring.  Has significant degenerative changes with the left worse than the right.  Is most consistent with the current pain that she is experiencing. -Counseled on home exercise therapy and supportive care. -Injection today. -Referral to physical therapy. -Could consider further imaging  Rotator cuff arthropathy of both shoulders Acutely occurring.  Having bilateral shoulder pain.  Has decent range of motion and reports a history of repeated steroid injections. -Counseled on home exercise therapy and supportive care. -Referral to physical therapy. -Could consider injection.  Spinal stenosis, lumbar region, without neurogenic claudication Acutely occurring.  She endorses a component of left-sided radicular type pain.  Does have significant stenosis in the lumbar region. -Counseled on home exercise therapy and supportive care. -Could consider  epidural.

## 2022-02-20 NOTE — Assessment & Plan Note (Signed)
Acutely occurring.  Has significant degenerative changes with the left worse than the right.  Is most consistent with the current pain that she is experiencing. -Counseled on home exercise therapy and supportive care. -Injection today. -Referral to physical therapy. -Could consider further imaging

## 2022-02-20 NOTE — Assessment & Plan Note (Signed)
Acutely occurring.  She endorses a component of left-sided radicular type pain.  Does have significant stenosis in the lumbar region. -Counseled on home exercise therapy and supportive care. -Could consider epidural.

## 2022-02-21 ENCOUNTER — Ambulatory Visit: Payer: Medicare PPO | Admitting: Primary Care

## 2022-02-21 NOTE — Addendum Note (Signed)
Addended by: Rosemarie Ax on: 02/21/2022 12:04 PM   Modules accepted: Orders

## 2022-03-06 ENCOUNTER — Other Ambulatory Visit (HOSPITAL_BASED_OUTPATIENT_CLINIC_OR_DEPARTMENT_OTHER): Payer: Self-pay

## 2022-03-06 NOTE — Progress Notes (Signed)
$'@Patient'r$  ID: Sierra Ford, female    DOB: [birth year], 82 y.o.   MRN: [id number]  Chief Complaint  Patient presents with   Follow-up    OSA  DOE     Referring provider: Carollee Herter, Alferd Apa, *  HPI: 82 year old female, former smoker (50 pack year hx) PMH significant for CAD, CHF, OSA, type 2 diabetes, DVT/PE in 2006  treated with coumadin x 54yr  01/2019  acute PE with R heart strain, RLE DVT  Status post thrombolytic  , was discharged on oxygen.    03/07/2022 Patient presents today for 1 year follow-up OSA. She is doing well, no acute complaints. No longer having dyspnea symptoms, repeat pulmonary testing in April 2023 was normal without evidence of COPD. She did not find that she needed or used Albuterol inhaler. She is compliant with CPAP nightly. She received new machine in 2022. No issues with pressure settings or mask fit.    Significant tests/ events reviewed   10/2020 ONO on CPAP/RA showed minimal desat , about 7 mins HST 11/08/15 AHI 28/h   05/2013 PFTs no airway obstruction, ratio 81, FEV1 98%, FVC 93%, DLCO 59% corrects to 80% for alveolar volume   02/2019 echo normal LVEF, normal RVSP   01/27/19 CTA chest PE >> Positive for acute PE with CT evidence of right heart strain (RV/LV Ratio = 1.7) consistent with at least submassive (intermediate risk)PE.    01/28/19  LE VAS duplex DVT >> Right: Findings consistent with acute deep vein thrombosis involving the right femoral vein, right popliteal vein, right posterior tibial veins, right peroneal veins, and right gastrocnemius veins.   No Known Allergies  Immunization History  Administered Date(s) Administered   COVID-19, mRNA, vaccine(Comirnaty)12 years and older 12/07/2021   Fluad Quad(high Dose 65+) 12/04/2019, 11/03/2020, 12/29/2020, 12/07/2021   Influenza, High Dose Seasonal PF 12/22/2018   Influenza-Unspecified 12/04/2018   PFIZER Comirnaty(Gray Top)Covid-19 Tri-Sucrose Vaccine 06/08/2020   PFIZER(Purple  Top)SARS-COV-2 Vaccination 03/06/2019, 03/27/2019, 11/10/2019   Pfizer Covid-19 Vaccine Bivalent Booster 163yr& up 12/06/2020   Pneumococcal Conjugate-13 04/24/2013   Pneumococcal Polysaccharide-23 08/07/2007, 12/04/2019   Td 03/03/2003   Td (Adult), 2 Lf Tetanus Toxid, Preservative Free 03/03/2003   Tdap 11/06/2018   Zoster Recombinat (Shingrix) 11/06/2018, 05/11/2019   Zoster, Live 08/14/2007, 11/13/2007    Past Medical History:  Diagnosis Date   Anxiety    Back pain    Chest pain    CLite with apical ischemia in 2006 - [facility];  Myoview 11/12:  Low risk stress nuclear study with a small, partially reversible apical defect most likely related to apical thinning; cannot R/O very mild apical ischemia.  EF: 75%    Decreased hearing    Depression    Di[place] ([facility]   [place]   hx of   Ectopic pregnancy with intrauterine pregnancy    Hiatal hernia    Hyperlipidemia    PE (pulmonary embolism) 2006   hx of   Sleep apnea    CPAP     Tobacco History: Social History   Tobacco Use  Smoking Status Former   Packs/day: 1.00   Years: 50.00   Total pack years: 50.00   Types: Cigarettes   Quit date: 11/21/2009   Years since quitting: 12.2  Smokeless Tobacco Never   Counseling given: Not Answered   Outpatient Medications Prior to Visit  Medication Sig Dispense Refill   apixaban (ELIQUIS) 2.5  MG TABS tablet Take 1 tablet (2.5 mg total) by mouth 2 (two) times daily. 60 tablet 5   blood glucose meter kit and supplies KIT Dispense based on patient and insurance preference. Use up to four times daily as directed. (FOR ICD-9 250.00, 250.01). 1 each 0   Calcium Carb-Cholecalciferol (CALCIUM + D3 PO) Take 1 tablet by mouth daily.     cholecalciferol (VITAMIN D3) 10 MCG (400 UNIT) TABS tablet Take 400 Units by mouth 2 (two) times daily.     Coenzyme Q10 100 MG TABS Take 100 mg by mouth daily.     COVID-19 mRNA vaccine  2023-2024 (COMIRNATY) syringe Inject into the muscle. 0.3 mL 0   Evolocumab (REPATHA SURECLICK) 381 MG/ML SOAJ Inject 140 mg into the skin every 14 (fourteen) days. 2 mL 11   Flaxseed, Linseed, (FLAXSEED OIL PO) Take 1,000 mg by mouth daily.      furosemide (LASIX) 40 MG tablet TAKE 1 TABLET DAILY (Patient taking differently: Take 40 mg by mouth in the morning.) 90 tablet 2   hydrocortisone (ANUSOL-HC) 25 MG suppository Place 1 suppository (25 mg total) rectally every 12 (twelve) hours. (Patient taking differently: Place 25 mg rectally 2 (two) times daily as needed for hemorrhoids.) 12 suppository 1   Insulin Pen Needle (NOVOFINE PLUS PEN NEEDLE) 32G X 4 MM MISC As directed 100 each 1   methylPREDNISolone (MEDROL) 4 MG tablet Standard 6 day taper dose 21 tablet 0   Multiple Vitamin (MULTIVITAMIN) capsule Take 1 capsule by mouth daily with breakfast.     Omega-3 Fatty Acids (FISH OIL) 1000 MG CAPS Take 1,000 mg by mouth daily.     potassium chloride SA (KLOR-CON M) 20 MEQ tablet Take 1 tablet (20 mEq total) by mouth daily. 90 tablet 3   rosuvastatin (CRESTOR) 10 MG tablet Take 1 tablet (10 mg total) by mouth daily. 90 tablet 2   Semaglutide, 1 MG/DOSE, 4 MG/3ML SOPN Inject 1 mg as directed once a week. (Patient taking differently: Inject 1 mg as directed every Tuesday.) 3 mL 3   TRUE METRIX BLOOD GLUCOSE TEST test strip TEST  UP  TO FOUR TIMES DAILY AS DIRECTED 200 each 12   TRUEPLUS LANCETS 33G MISC TEST  UP TO FOUR TIMES DAILY AS DIRECTED 200 each 12   gabapentin (NEURONTIN) 300 MG capsule Take 300 mg by mouth at bedtime. (Patient not taking: Reported on 03/07/2022)     influenza vaccine adjuvanted (FLUAD QUADRIVALENT) 0.5 ML injection Inject into the muscle. (Patient not taking: Reported on 03/07/2022) 0.5 mL 0   No facility-administered medications prior to visit.      Review of Systems  Review of Systems  Constitutional: Negative.   HENT: Negative.    Respiratory: Negative.     Cardiovascular: Negative.      Physical Exam  BP 118/74 (BP Location: Left Arm, Patient Position: Sitting, Cuff Size: Normal)   Pulse 91   Ht '5\' 2"'$  (1.575 m)   Wt 173 lb (78.5 kg)   SpO2 97%   BMI 31.64 kg/m  Physical Exam Constitutional:      Appearance: Normal appearance.  HENT:     Head: Normocephalic and atraumatic.     Mouth/Throat:     Mouth: Mucous membranes are moist.     Pharynx: Oropharynx is clear.  Cardiovascular:     Rate and Rhythm: Normal rate and regular rhythm.  Pulmonary:     Effort: Pulmonary effort is normal.     Breath sounds: Normal  breath sounds.  Musculoskeletal:        General: Normal range of motion.  Skin:    General: Skin is warm and dry.  Neurological:     General: No focal deficit present.     Mental Status: She is alert and oriented to person, place, and time. Mental status is at baseline.  Psychiatric:        Mood and Affect: Mood normal.        Behavior: Behavior normal.        Thought Content: Thought content normal.        Judgment: Judgment normal.      Lab Results:  CBC    Component Value Date/Time   WBC 4.4 09/04/2021 1102   RBC 4.32 09/04/2021 1102   HGB 14.0 09/04/2021 1102   HGB 14.4 05/27/2017 1224   HCT 42.5 09/04/2021 1102   HCT 42.2 05/27/2017 1224   PLT 248.0 09/04/2021 1102   PLT 221 05/27/2017 1224   MCV 98.6 09/04/2021 1102   MCV 90 05/27/2017 1224   MCH 31.1 01/31/2019 0230   MCHC 32.8 09/04/2021 1102   RDW 14.1 09/04/2021 1102   RDW 13.7 05/27/2017 1224   LYMPHSABS 1.4 09/04/2021 1102   LYMPHSABS 2.1 05/27/2017 1224   MONOABS 0.5 09/04/2021 1102   EOSABS 0.1 09/04/2021 1102   EOSABS 0.2 05/27/2017 1224   BASOSABS 0.0 09/04/2021 1102   BASOSABS 0.0 05/27/2017 1224    BMET    Component Value Date/Time   NA 139 11/08/2021 1016   K 4.7 11/08/2021 1016   CL 96 11/08/2021 1016   CO2 26 11/08/2021 1016   GLUCOSE 126 (H) 11/08/2021 1016   GLUCOSE 112 (H) 09/04/2021 1102   BUN 17 11/08/2021 1016    CREATININE 0.89 11/08/2021 1016   CREATININE 0.82 11/10/2019 1122   CALCIUM 9.9 11/08/2021 1016   GFRNONAA >60 01/31/2019 0230   GFRNONAA 71 10/04/2015 0845   GFRAA >60 01/31/2019 0230   GFRAA 82 10/04/2015 0845    BNP    Component Value Date/Time   BNP 37.6 03/04/2018 1609    ProBNP    Component Value Date/Time   PROBNP 28.0 05/18/2013 0948    Imaging: MR LUMBAR SPINE WO CONTRAST  Result Date: 02/07/2022 CLINICAL DATA:  Low back pain and left hip pain EXAM: MRI LUMBAR SPINE WITHOUT CONTRAST TECHNIQUE: Multiplanar, multisequence MR imaging of the lumbar spine was performed. No intravenous contrast was administered. COMPARISON:  05/23/2017 MRI lumbar spine FINDINGS: Segmentation:  5 lumbar type vertebral bodies. Alignment: 4 mm anterolisthesis of L3 on L4, unchanged. Mild dextrocurvature Vertebrae:  No fracture, evidence of discitis, or bone lesion. Conus medullaris and cauda equina: Conus extends to the L1 level. Conus and cauda equina appear normal. Paraspinal and other soft tissues: No acute finding. Disc levels: T12-L1: No significant disc bulge. No spinal canal stenosis or neural foraminal narrowing. L1-L2: No significant disc bulge. No spinal canal stenosis or neural foraminal narrowing. L2-L3: Minimal disc bulge. Mild-to-moderate right and mild left facet arthropathy. Ligamentum flavum hypertrophy. Mild spinal canal stenosis, slightly progressed from the prior exam. No neural foraminal narrowing. L3-L4: Grade 1 anterolisthesis with disc unroofing. Severe facet arthropathy. Ligamentum flavum hypertrophy. Unchanged severe spinal canal stenosis. Narrowing of the lateral recesses. Mild bilateral neural foraminal narrowing, unchanged. L4-L5: Mild disc bulge with increased right subarticular, foraminal, and extreme lateral disc protrusion, which likely contacts the exiting right L4 nerve. In addition, there is a new left foraminal protrusion. Effacement of the right  lateral recess.  Narrowing of the left lateral recess. Mild to moderate spinal canal stenosis, which has progressed from the prior exam. Mild bilateral neural foraminal narrowing, similar to prior. L5-S1: Mild disc bulge. Moderate to severe right and mild left facet arthropathy. No spinal canal stenosis or neural foraminal narrowing. IMPRESSION: 1. L3-L4 severe spinal canal stenosis and mild bilateral neural foraminal narrowing, unchanged. Narrowing of the lateral recesses at this level could affect the descending L4 nerve roots. 2. L4-L5 mild-to-moderate spinal canal stenosis, which has progressed from the prior exam, and mild bilateral neural foraminal narrowing. Effacement of the right lateral recess at this level likely compresses the descending right L5 nerve roots. Narrowing of the left lateral recess at this level could affect the descending left L5 nerve roots. 3. L2-L3 mild spinal canal stenosis, slightly progressed from the prior exam. Electronically Signed   By: Merilyn Baba M.D.   On: 02/07/2022 22:12   DG HIP UNILAT WITH PELVIS 2-3 VIEWS LEFT  Result Date: 02/07/2022 CLINICAL DATA:  82 year old female with back and left hip pain for 3 weeks. No known injury. EXAM: DG HIP (WITH OR WITHOUT PELVIS) 2-3V LEFT COMPARISON:  Bilateral hip series 09/04/2021. FINDINGS: AP pelvis, AP and frog-leg views of the left hip. Chronic bilateral axial hip joint space loss, subchondral sclerosis, acetabular and femoral head degenerative spurring. Changes are perhaps more advanced on the left. But the pelvis and proximal left femur appear intact. Normal background bone mineralization. Normal SI joints. No acute osseous abnormality identified. Negative lower abdominal and pelvic visceral contours. IMPRESSION: Moderately advanced bilateral hip osteoarthritis. No acute osseous abnormality identified. Electronically Signed   By: Genevie Ann M.D.   On: 02/07/2022 10:07   DG Lumbar Spine Complete  Result Date: 02/07/2022 CLINICAL DATA:   Back pain EXAM: LUMBAR SPINE - COMPLETE 4+ VIEW COMPARISON:  Lumbar MRI 05/23/2017 FINDINGS: No evidence of acute fracture or aggressive bone lesion. Lumbar spine degeneration with diffuse disc space narrowing and degenerative facet spurring. Mild L3-4 anterolisthesis. Bilateral hip osteoarthritis with prominent axial joint space narrowing on the left. IMPRESSION: 1. No acute finding. 2. 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 Advanced spinal stenosis at this level by 2019 MRI. 3. Advanced hip osteoarthritis. Electronically Signed   By: Jorje Guild M.D.   On: 02/07/2022 10:07     Assessment & Plan:   OSA (obstructive sleep apnea) - Sleep study September 2017 showed moderate OSA, AHI 28/hour  - Received new CPAP machine in Nov 2022 - Patient is 100% compliant with CPAP use last 30 days. Pressure 10cm h20; Residual AHI 1.4  - No changes needed today  - Encourage patient continue to wear CPAP every night min 4-6 hours or longer. Advised she focus on side sleeping position, continue to work on weight loss and avoid driving if experiencing daytime sleepiness  - FU in 1 year or sooner if needed   DOE (dyspnea on exertion) - Resolved; No significant COPD noted on PFTs, repeat pulmonary testing was normal in April 2023. Not needing to use Albuterol inhaler.    Martyn Ehrich, NP 03/07/2022

## 2022-03-07 ENCOUNTER — Ambulatory Visit (INDEPENDENT_AMBULATORY_CARE_PROVIDER_SITE_OTHER): Payer: Medicare PPO | Admitting: Primary Care

## 2022-03-07 ENCOUNTER — Encounter: Payer: Self-pay | Admitting: Primary Care

## 2022-03-07 VITALS — BP 118/74 | HR 91 | Ht 62.0 in | Wt 173.0 lb

## 2022-03-07 DIAGNOSIS — R0609 Other forms of dyspnea: Secondary | ICD-10-CM

## 2022-03-07 DIAGNOSIS — G4733 Obstructive sleep apnea (adult) (pediatric): Secondary | ICD-10-CM | POA: Diagnosis not present

## 2022-03-07 NOTE — Patient Instructions (Signed)
Excellent compliance with CPAP Continue to wear nightly and with naps Pulmonary testing in April 2023 was normal- no evidence of COPD Call DME company to discuss charges   Follow-up 1 year with Eustaquio Maize NP for CPAP compliance (can be virtual)  CPAP and BIPAP Information CPAP and BIPAP are methods that use air pressure to keep your airways open and to help you breathe well. CPAP and BIPAP use different amounts of pressure. Your health care provider will tell you whether CPAP or BIPAP would be more helpful for you. CPAP stands for "continuous positive airway pressure." With CPAP, the amount of pressure stays the same while you breathe in (inhale) and out (exhale). BIPAP stands for "bi-level positive airway pressure." With BIPAP, the amount of pressure will be higher when you inhale and lower when you exhale. This allows you to take larger breaths. CPAP or BIPAP may be used in the hospital, or your health care provider may want you to use it at home. You may need to have a sleep study before your health care provider can order a machine for you to use at home. What are the advantages? CPAP or BIPAP can be helpful if you have: Sleep apnea. Chronic obstructive pulmonary disease (COPD). Heart failure. Medical conditions that cause muscle weakness, including muscular dystrophy or amyotrophic lateral sclerosis (ALS). Other problems that cause breathing to be shallow, weak, abnormal, or difficult. CPAP and BIPAP are most commonly used for obstructive sleep apnea (OSA) to keep the airways from collapsing when the muscles relax during sleep. What are the risks? Generally, this is a safe treatment. However, problems may occur, including: Irritated skin or skin sores if the mask does not fit properly. Dry or stuffy nose or nosebleeds. Dry mouth. Feeling gassy or bloated. Sinus or lung infection if the equipment is not cleaned properly. When should CPAP or BIPAP be used? In most cases, the mask only needs  to be worn during sleep. Generally, the mask needs to be worn throughout the night and during any daytime naps. People with certain medical conditions may also need to wear the mask at other times, such as when they are awake. Follow instructions from your health care provider about when to use the machine. What happens during CPAP or BIPAP?  Both CPAP and BIPAP are provided by a small machine with a flexible plastic tube that attaches to a plastic mask that you wear. Air is blown through the mask into your nose or mouth. The amount of pressure that is used to blow the air can be adjusted on the machine. Your health care provider will set the pressure setting and help you find the best mask for you. Tips for using the mask Because the mask needs to be snug, some people feel trapped or closed-in (claustrophobic) when first using the mask. If you feel this way, you may need to get used to the mask. One way to do this is to hold the mask loosely over your nose or mouth and then gradually apply the mask more snugly. You can also gradually increase the amount of time that you use the mask. Masks are available in various types and sizes. If your mask does not fit well, talk with your health care provider about getting a different one. Some common types of masks include: Full face masks, which fit over the mouth and nose. Nasal masks, which fit over the nose. Nasal pillow or prong masks, which fit into the nostrils. If you are using a  mask that fits over your nose and you tend to breathe through your mouth, a chin strap may be applied to help keep your mouth closed. Use a skin barrier to protect your skin as told by your health care provider. Some CPAP and BIPAP machines have alarms that may sound if the mask comes off or develops a leak. If you have trouble with the mask, it is very important that you talk with your health care provider about finding a way to make the mask easier to tolerate. Do not stop  using the mask. There could be a negative impact on your health if you stop using the mask. Tips for using the machine Place your CPAP or BIPAP machine on a secure table or stand near an electrical outlet. Know where the on/off switch is on the machine. Follow instructions from your health care provider about how to set the pressure on your machine and when you should use it. Do not eat or drink while the CPAP or BIPAP machine is on. Food or fluids could get pushed into your lungs by the pressure of the CPAP or BIPAP. For home use, CPAP and BIPAP machines can be rented or purchased through home health care companies. Many different brands of machines are available. Renting a machine before purchasing may help you find out which particular machine works well for you. Your health insurance company may also decide which machine you may get. Keep the CPAP or BIPAP machine and attachments clean. Ask your health care provider for specific instructions. Check the humidifier if you have a dry stuffy nose or nosebleeds. Make sure it is working correctly. Follow these instructions at home: Take over-the-counter and prescription medicines only as told by your health care provider. Ask if you can take sinus medicine if your sinuses are blocked. Do not use any products that contain nicotine or tobacco. These products include cigarettes, chewing tobacco, and vaping devices, such as e-cigarettes. If you need help quitting, ask your health care provider. Keep all follow-up visits. This is important. Contact a health care provider if: You have redness or pressure sores on your head, face, mouth, or nose from the mask or head gear. You have trouble using the CPAP or BIPAP machine. You cannot tolerate wearing the CPAP or BIPAP mask. Someone tells you that you snore even when wearing your CPAP or BIPAP. Get help right away if: You have trouble breathing. You feel confused. Summary CPAP and BIPAP are methods that  use air pressure to keep your airways open and to help you breathe well. If you have trouble with the mask, it is very important that you talk with your health care provider about finding a way to make the mask easier to tolerate. Do not stop using the mask. There could be a negative impact to your health if you stop using the mask. Follow instructions from your health care provider about when to use the machine. This information is not intended to replace advice given to you by your health care provider. Make sure you discuss any questions you have with your health care provider. Document Revised: 09/14/2020 Document Reviewed: 01/15/2020 Elsevier Patient Education  [place].

## 2022-03-07 NOTE — Assessment & Plan Note (Addendum)
-  Resolved; No significant COPD noted on PFTs, repeat pulmonary testing was normal in April 2023. Not needing to use Albuterol inhaler.

## 2022-03-07 NOTE — Assessment & Plan Note (Addendum)
-  Sleep study September 2017 showed moderate OSA, AHI 28/hour  - Received new CPAP machine in Nov 2022 - Patient is 100% compliant with CPAP use last 30 days. Pressure 10cm h20; Residual AHI 1.4  - No changes needed today  - Encourage patient continue to wear CPAP every night min 4-6 hours or longer. Advised she focus on side sleeping position, continue to work on weight loss and avoid driving if experiencing daytime sleepiness  - FU in 1 year or sooner if needed

## 2022-03-08 ENCOUNTER — Other Ambulatory Visit (HOSPITAL_BASED_OUTPATIENT_CLINIC_OR_DEPARTMENT_OTHER): Payer: Self-pay

## 2022-03-08 ENCOUNTER — Ambulatory Visit: Payer: Medicare PPO | Admitting: Pharmacist

## 2022-03-08 DIAGNOSIS — E1165 Type 2 diabetes mellitus with hyperglycemia: Secondary | ICD-10-CM

## 2022-03-08 DIAGNOSIS — E1169 Type 2 diabetes mellitus with other specified complication: Secondary | ICD-10-CM

## 2022-03-08 NOTE — Progress Notes (Signed)
Pharmacy Note  03/08/2022 Name: Sierra Ford MRN: [id number] DOB: 11-06-1940  Subjective: Sierra Ford is a 82 y.o. year old female who is a primary care patient of Carollee Herter, Alferd Apa, DO. Clinical Pharmacist Practitioner referral was placed to assist with medication and diabetes management.    Engaged with patient by telephone for follow up visit today.  Type 2 DM: patient is having difficulty getting Ozempic from her usual pharmacy because they are out of stock. She states that she was told her coupon for Ozempic has expired. She need information sent [facility].  Called [facility]. Provided with new ozempic savings card information. Copay was $250 per month (up from $200 in 2023). Copay care lowers by $150 per month. So patient's cost would be $100 per month.   CHF / Edema: Taking furosemide '40mg'$  daily and potassium chloride 20 mEq daily. Last EF was 55 to 60% (03/03/2019). Improved edema / shortness of breath since restarted furosmide.   Hyperlipidemia: Praulent has been change to Repatha due to insurance preferance. She is also taking rosuvastatin. Enrolled in [facility] with cardiology office  Objective: Review of patient status, including review of consultants reports, laboratory and other test data, was performed as part of comprehensive evaluation and provision of chronic care management services.   Lab Results  Component Value Date   CREATININE 0.89 11/08/2021   CREATININE 0.70 09/04/2021   CREATININE 0.84 04/04/2021    Lab Results  Component Value Date   HGBA1C 7.0 (H) 09/04/2021       Component Value Date/Time   CHOL 201 (H) 09/04/2021 1102   CHOL 209 (H) 08/26/2018 1018   TRIG 68.0 09/04/2021 1102   HDL 119.20 09/04/2021 1102   HDL 101 08/26/2018 1018   CHOLHDL 2 09/04/2021 1102   VLDL 13.6 09/04/2021 1102   LDLCALC 68 09/04/2021 1102   LDLCALC 69 11/10/2019 1122   LDLDIRECT 95.9  10/06/2012 1248     Clinical ASCVD: Yes  The ASCVD Risk score (Arnett DK, et al., 2019) failed to calculate for the following reasons:   The 2019 ASCVD risk score is only valid for ages 36 to 63    BP Readings from Last 3 Encounters:  03/07/22 118/74  02/20/22 130/82  02/14/22 118/64     No Known Allergies  Medications Reviewed Today     Reviewed by Martyn Ehrich, NP (Nurse Practitioner) on 03/07/22 at 1135  Med List Status: <None>   Medication Order Taking? Sig Documenting Provider Last Dose Status Informant  apixaban (ELIQUIS) 2.5 MG TABS tablet [id number] Yes Take 1 tablet (2.5 mg total) by mouth 2 (two) times daily. Minus Breeding, MD Taking Active Self  blood glucose meter kit and supplies KIT [id number] Yes Dispense based on patient and insurance preference. Use up to four times daily as directed. (FOR ICD-9 250.00, 250.01). Ann Held, DO Taking Active Self  Calcium Carb-Cholecalciferol (CALCIUM + D3 PO) [id number] Yes Take 1 tablet by mouth daily. [provider] Taking Active Self  cholecalciferol (VITAMIN D3) 10 MCG (400 UNIT) TABS tablet [id number] Yes Take 400 Units by mouth 2 (two) times daily. [provider] Taking Active Self  Coenzyme Q10 100 MG TABS [id number] Yes Take 100 mg by mouth daily. [provider] Taking Active Self  COVID-19 mRNA vaccine 2023-2024 (COMIRNATY) syringe [id number] Yes Inject into the muscle. Carlyle Basques, MD Taking Active Self  Evolocumab (REPATHA SURECLICK) 124 MG/ML Darden Palmer  [id number] Yes Inject 140 mg into the skin every 14 (fourteen) days. Minus Breeding, MD Taking Active Self  Flaxseed, Linseed, (FLAXSEED OIL PO) [id number] Yes Take 1,000 mg by mouth daily.  [provider] Taking Active Self  furosemide (LASIX) 40 MG tablet [id number] Yes TAKE 1 TABLET DAILY  Patient taking differently: Take 40 mg by mouth in the morning.   Minus Breeding, MD Taking Active Self           Med Note Lia Hopping,  MINDY L   Wed Nov 08, 2021  9:21 AM)    gabapentin (NEURONTIN) 300 MG capsule [id number] No Take 300 mg by mouth at bedtime.  Patient not taking: Reported on 03/07/2022   [provider] Not Taking Active Self           Med Note Duffy Bruce, Legrand Como   Wed Feb 07, 2022  8:59 PM) "Not effective," per the patient  hydrocortisone (ANUSOL-HC) 25 MG suppository [id number] Yes Place 1 suppository (25 mg total) rectally every 12 (twelve) hours.  Patient taking differently: Place 25 mg rectally 2 (two) times daily as needed for hemorrhoids.   Ann Held, DO Taking Active Self  influenza vaccine adjuvanted (FLUAD QUADRIVALENT) 0.5 ML injection [id number] No Inject into the muscle.  Patient not taking: Reported on 03/07/2022   Carlyle Basques, MD Not Taking Active Self  Insulin Pen Needle (NOVOFINE PLUS PEN NEEDLE) 32G X 4 MM MISC [id number] Yes As directed Ann Held, DO Taking Active Self  methylPREDNISolone (MEDROL) 4 MG tablet [id number] Yes Standard 6 day taper dose Saguier, Percell Miller, PA-C Taking Active   Multiple Vitamin (MULTIVITAMIN) capsule [id number] Yes Take 1 capsule by mouth daily with breakfast. [provider] Taking Active Self    Discontinued 05/07/11 1124 (Discontinued by provider) Omega-3 Fatty Acids (FISH OIL) 1000 MG CAPS [id number] Yes Take 1,000 mg by mouth daily. [provider] Taking Active Self  potassium chloride SA (KLOR-CON M) 20 MEQ tablet [id number] Yes Take 1 tablet (20 mEq total) by mouth daily. Finis Bud, NP Taking Active Self    Discontinued 05/07/11 1124 (Change in therapy) rosuvastatin (CRESTOR) 10 MG tablet [id number] Yes Take 1 tablet (10 mg total) by mouth daily. Minus Breeding, MD Taking Active Self  Semaglutide, 1 MG/DOSE, 4 MG/3ML SOPN [id number] Yes Inject 1 mg as directed once a week.  Patient taking differently: Inject 1 mg as directed every Tuesday.   Carollee Herter, Kendrick Fries R, DO Taking Active Self  TRUE METRIX BLOOD GLUCOSE  TEST test strip [id number] Yes TEST  UP  TO FOUR TIMES DAILY AS DIRECTED Ann Held, DO Taking Active Self  TRUEPLUS LANCETS 33G [place] [id number] Yes TEST  UP TO FOUR TIMES DAILY AS DIRECTED Ann Held, DO Taking Active Self            Patient Active Problem List   Diagnosis Date Noted   Primary osteoarthritis of left hip 02/20/2022   Spinal stenosis, lumbar region, without neurogenic claudication 02/20/2022   Rotator cuff arthropathy of both shoulders 02/20/2022   Lower abdominal pain 04/04/2021   Rectal bleeding 04/04/2021   Change in bowel habits 04/04/2021   Internal hemorrhoids 04/04/2021   Preventative health care 03/07/2021   Sacroiliitis ([place]) 03/07/2021   Morbid (severe) obesity due to excess calories ([place]) 03/07/2021   Bronchitis 12/20/2020   Other pulmonary embolism with acute cor pulmonale, unspecified chronicity ([place])    Educated about COVID-19 virus infection 01/26/2019   Hyperlipidemia associated with  type 2 diabetes mellitus ([place]) 04/24/2018   Right ankle swelling 05/13/2017   Diabetes mellitus, type II ([place]) 04/08/2017   Essential hypertension 04/08/2017   Atherosclerosis of aorta ([place]) 04/08/2017   Abnormal auditory perception of both ears 10/01/2016   Bilateral impacted cerumen 10/01/2016   OSA (obstructive sleep apnea) 12/16/2015   DOE (dyspnea on exertion) 06/12/2013   Obesity (BMI 30-39.9) 04/24/2013   Chest pain, unspecified 01/22/2011   Abnormal stress test 01/22/2011   Frequent urination 01/22/2011   Chronic diastolic heart failure ([place]) 05/15/2009   CAROTID ARTERY DISEASE 01/12/2009   LEG EDEMA, RIGHT 07/27/2008   HX, URINARY INFECTION 09/19/2006   Hyperlipidemia LDL goal <70 08/20/2006   PREGNANCY, ECTOPIC NEC W/INTRAUTERINE PRG 08/20/2006   FREQUENCY, URINARY 08/20/2006   Other specified abnormal findings of blood chemistry 08/20/2006   Personal history of venous thrombosis and embolism 08/20/2006     Medication  Assistance:   Patient has [employer]HA coverage for prescriptions. She is able to use discount coupon for Ozempic. Uses [place] for [place] thru 06/2022 or upto $2500 per year.    Assessment / Plan Type 2 DM: controlled Continue Ozempic '1mg'$  weekly [place] [facility] was able to fill Ozempic. Cost on her [employer]HA plan was $250; Ozempic discount care lowered cost by $150 to $100 and [place] covered rest of cost for total cost to patient of $0.  Patient only has $96 left on [place] - so she will bet 1 more fill for cost of about $4. Options are to reapply for [employer] but is not currently open / accepting applications or try to apply for patient assistance program (patient has been turned down in past due to having [employer])  Checked formulary alternatives: Rybelsus, Mounjaro and Trulicity are also $233 per month. Their savings card are all similar to Ozempic - max discount is $150 per 30 days so will lower cost to about $100 / month.   Hyperlipidemia: at goal of < 70 Continue Repatha and rosuvastatin. Patient will check with cardiology office regarding [facility] for hypercholesterolemia and when it expires.      Follow Up:  by phone in 2 to 3 weeks   Cherre Robins, PharmD Clinical Pharmacist [place] [phone]

## 2022-03-08 NOTE — Progress Notes (Deleted)
   03/08/2022  Patient ID: Sierra Ford, female   DOB: [birth year], 82 y.o.   MRN: [id number]

## 2022-03-08 NOTE — Progress Notes (Signed)
Subjective:   By signing my name below, I, Daiva Huge, attest that this documentation has been prepared under the direction and in the presence of Ann Held, DO 03/09/22   Patient ID: Sierra Ford, female    DOB: 07/09/1940, 82 y.o.   MRN: [id number]  Chief Complaint  Patient presents with   Annual Exam    Pt states fasting.    HPI Patient is in today for a comprehensive physical exam.  Spinal Stenosis She is experiencing sciatica. She has consulted multiple specialists and visited the ED with no relief.  Using a heated blanket gives some relief.  She receives regular Cortizone injections from her sports medicine doctor. She does not want to continue with Cortizone injections. She is uncertain regarding whether she wants to pursue surgical intervention to relieve sciatica. Her Gabapentin does not give her pain relief. She is interested in restarting physical therapy.  Diabetes She reports a Blood Sugar level of 121 this morning and 157 on Tuesday morning. Otherwise, it has been stable. Lab Results  Component Value Date   HGBA1C 7.0 (H) 09/04/2021    Sleep Deprivation She reports nocturnal arousal decreasing to once per night instead of 5 times after having a sleep study.  She is UTD on eye and dental care.  Colonoscopy: Last completed 07/17/2017.  Dexa: Overdue. Last completed 07/15/2019. Repeat in 2 years. Next scan postponed until 06/20/2022. Mammogram: Last completed on 10/20/2021. Repeat in 1 year. Lung Cancer Screening: Overdue. Last completed on 01/27/2019. Repeat in 1 year.  She reports smoking cessation in 2011.   Past Medical History:  Diagnosis Date   Anxiety    Back pain    Chest pain    CLite with apical ischemia in 2006 - normal coronary arteries by [facility] in 12/2004;  Myoview 11/12:  Low risk stress nuclear study with a small, partially reversible apical defect most likely related to apical thinning; cannot R/O very mild apical ischemia.  EF:  75%    Decreased hearing    Depression    Diabetes ([place])    DVT (deep venous thrombosis) ([place]) 2006   hx of   Ectopic pregnancy with intrauterine pregnancy    Hiatal hernia    Hyperlipidemia    PE (pulmonary embolism) 2006   hx of   Sleep apnea    CPAP     Past Surgical History:  Procedure Laterality Date   ABDOMINAL EXPLORATION SURGERY     BUNIONECTOMY     right   COLONOSCOPY  2009   ECTOPIC PREGNANCY SURGERY     FOOT SURGERY     TUBAL LIGATION      Family History  Problem Relation Age of Onset   Heart failure Mother        died from   Colon polyps Mother    Hypertension Mother    Sudden death Mother    Obesity Mother    Alcoholism Father    Diabetes Maternal Aunt        grandaughter   Diabetes Maternal Aunt    Colon cancer Neg Hx    Esophageal cancer Neg Hx    Stomach cancer Neg Hx     Social History   Socioeconomic History   Marital status: Divorced    Spouse name: Not on file   Number of children: Not on file   Years of education: Not on file   Highest education level: Not on file  Occupational History   Occupation: Retired Therapist, sports  clerk  Tobacco Use   Smoking status: Former    Packs/day: 1.00    Years: 50.00    Total pack years: 50.00    Types: Cigarettes    Quit date: 11/21/2009    Years since quitting: 12.3   Smokeless tobacco: Never  Vaping Use   Vaping Use: Never used  Substance and Sexual Activity   Alcohol use: Yes    Comment: rare 3 times a year per pt   Drug use: No   Sexual activity: Not Currently    Partners: Male  Other Topics Concern   Not on file  Social History Narrative   Regular exercise--- no   Social Determinants of Health   Financial Resource Strain: Medium Risk (03/20/2021)   Overall Financial Resource Strain (CARDIA)    Difficulty of Paying Living Expenses: Somewhat hard  Food Insecurity: No Food Insecurity (02/13/2022)   Hunger Vital Sign    Worried About Running Out of Food in the Last Year: Never true    Ran  Out of Food in the Last Year: Never true  Transportation Needs: No Transportation Needs (02/13/2022)   PRAPARE - Hydrologist (Medical): No    Lack of Transportation (Non-Medical): No  Physical Activity: Sufficiently Active (08/15/2020)   Exercise Vital Sign    Days of Exercise per Week: 4 days    Minutes of Exercise per Session: 60 min  Stress: No Stress Concern Present (08/15/2020)   [place]    Feeling of Stress : Only a little  Social Connections: Moderately Integrated (08/15/2020)   Social Connection and Isolation Panel [NHANES]    Frequency of Communication with Friends and Family: More than three times a week    Frequency of Social Gatherings with Friends and Family: More than three times a week    Attends Religious Services: More than 4 times per year    Active Member of [employer] or Organizations: Yes    Attends Archivist Meetings: 1 to 4 times per year    Marital Status: Widowed  Intimate Partner Violence: Not At Risk (08/15/2020)   Humiliation, Afraid, Rape, and Kick questionnaire    Fear of Current or Ex-Partner: No    Emotionally Abused: No    Physically Abused: No    Sexually Abused: No    Outpatient Medications Prior to Visit  Medication Sig Dispense Refill   apixaban (ELIQUIS) 2.5 MG TABS tablet Take 1 tablet (2.5 mg total) by mouth 2 (two) times daily. 60 tablet 5   blood glucose meter kit and supplies KIT Dispense based on patient and insurance preference. Use up to four times daily as directed. (FOR ICD-9 250.00, 250.01). 1 each 0   Calcium Carb-Cholecalciferol (CALCIUM + D3 PO) Take 1 tablet by mouth daily.     cholecalciferol (VITAMIN D3) 10 MCG (400 UNIT) TABS tablet Take 400 Units by mouth 2 (two) times daily.     Coenzyme Q10 100 MG TABS Take 100 mg by mouth daily.     Evolocumab (REPATHA SURECLICK) 403 MG/ML SOAJ Inject 140 mg into the skin every 14 (fourteen)  days. 2 mL 11   Flaxseed, Linseed, (FLAXSEED OIL PO) Take 1,000 mg by mouth daily.      furosemide (LASIX) 40 MG tablet TAKE 1 TABLET DAILY (Patient taking differently: Take 40 mg by mouth in the morning.) 90 tablet 2   hydrocortisone (ANUSOL-HC) 25 MG suppository Place 1 suppository (25 mg total)  rectally every 12 (twelve) hours. (Patient taking differently: Place 25 mg rectally 2 (two) times daily as needed for hemorrhoids.) 12 suppository 1   Insulin Pen Needle (NOVOFINE PLUS PEN NEEDLE) 32G X 4 MM MISC As directed 100 each 1   Multiple Vitamin (MULTIVITAMIN) capsule Take 1 capsule by mouth daily with breakfast.     Omega-3 Fatty Acids (FISH OIL) 1000 MG CAPS Take 1,000 mg by mouth daily.     potassium chloride SA (KLOR-CON M) 20 MEQ tablet Take 1 tablet (20 mEq total) by mouth daily. 90 tablet 3   rosuvastatin (CRESTOR) 10 MG tablet Take 1 tablet (10 mg total) by mouth daily. 90 tablet 2   Semaglutide, 1 MG/DOSE, 4 MG/3ML SOPN Inject 1 mg as directed once a week. (Patient taking differently: Inject 1 mg as directed every Tuesday.) 3 mL 3   TRUE METRIX BLOOD GLUCOSE TEST test strip TEST  UP  TO FOUR TIMES DAILY AS DIRECTED 200 each 12   TRUEPLUS LANCETS 33G MISC TEST  UP TO FOUR TIMES DAILY AS DIRECTED 200 each 12   gabapentin (NEURONTIN) 300 MG capsule Take 300 mg by mouth at bedtime.     methylPREDNISolone (MEDROL) 4 MG tablet Standard 6 day taper dose 21 tablet 0   COVID-19 mRNA vaccine 2023-2024 (COMIRNATY) syringe Inject into the muscle. (Patient not taking: Reported on 03/09/2022) 0.3 mL 0   influenza vaccine adjuvanted (FLUAD QUADRIVALENT) 0.5 ML injection Inject into the muscle. (Patient not taking: Reported on 03/07/2022) 0.5 mL 0   No facility-administered medications prior to visit.    No Known Allergies  Review of Systems  Constitutional:  Negative for fever and malaise/fatigue.  HENT:  Negative for congestion, sinus pain and sore throat.   Eyes:  Negative for blurred vision.   Respiratory:  Negative for cough, hemoptysis, shortness of breath and wheezing.   Cardiovascular:  Negative for chest pain, palpitations and leg swelling.  Gastrointestinal:  Negative for abdominal pain, constipation, diarrhea, nausea and vomiting.  Genitourinary:  Negative for flank pain, hematuria and urgency.  Musculoskeletal:  Positive for myalgias (Pain in groin, legs, lower back from sciatica). Negative for back pain.  Skin:  Negative for rash.  Neurological:  Positive for weakness (Bilateral legs). Negative for loss of consciousness and headaches.       (+) Sciatica       Objective:    Physical Exam Vitals and nursing note reviewed.  Constitutional:      General: She is not in acute distress.    Appearance: Normal appearance. She is not ill-appearing.  HENT:     Head: Normocephalic and atraumatic.     Right Ear: Tympanic membrane, ear canal and external ear normal.     Left Ear: Tympanic membrane, ear canal and external ear normal.     Nose: Nose normal. No congestion or rhinorrhea.     Mouth/Throat:     Mouth: Mucous membranes are moist.     Pharynx: No posterior oropharyngeal erythema.  Eyes:     Extraocular Movements: Extraocular movements intact.     Conjunctiva/sclera: Conjunctivae normal.     Pupils: Pupils are equal, round, and reactive to light.  Cardiovascular:     Rate and Rhythm: Normal rate and regular rhythm.     Heart sounds: Normal heart sounds. No murmur heard.    No gallop.  Pulmonary:     Effort: Pulmonary effort is normal. No respiratory distress.     Breath sounds: Normal breath sounds. No wheezing  or rales.  Abdominal:     General: Bowel sounds are normal. There is no distension.     Palpations: Abdomen is soft.     Tenderness: There is no abdominal tenderness. There is no right CVA tenderness or guarding.  Musculoskeletal:        General: Normal range of motion.     Cervical back: Normal range of motion and neck supple.  Skin:    General:  Skin is warm and dry.  Neurological:     General: No focal deficit present.     Mental Status: She is alert and oriented to person, place, and time.  Psychiatric:        Mood and Affect: Mood normal.        Judgment: Judgment normal.     BP 110/78 (BP Location: Left Arm, Patient Position: Sitting, Cuff Size: Large)   Pulse 90   Temp 97.8 F (36.6 C) (Oral)   Resp 18   Ht '5\' 2"'$  (1.575 m)   Wt 176 lb 6.4 oz (80 kg)   SpO2 97%   BMI 32.26 kg/m  Wt Readings from Last 3 Encounters:  03/09/22 176 lb 6.4 oz (80 kg)  03/07/22 173 lb (78.5 kg)  02/20/22 172 lb (78 kg)       Assessment & Plan:  Preventative health care Assessment & Plan: Ghm utd Check labs  See AVS    Spinal stenosis of lumbar region, unspecified whether neurogenic claudication present -     Ambulatory referral to Orthopedic Surgery -     Lipid panel -     CBC with Differential/Platelet -     Comprehensive metabolic panel -     Hemoglobin A1c -     Microalbumin / creatinine urine ratio  Uncontrolled type 2 diabetes mellitus with hyperglycemia (HCC) Assessment & Plan: hgba1c to be checked. minimize simple carbs. Increase exercise as tolerated. Continue current meds   Orders: -     Lipid panel -     CBC with Differential/Platelet -     Comprehensive metabolic panel -     Hemoglobin A1c -     Microalbumin / creatinine urine ratio  Hyperlipidemia associated with type 2 diabetes mellitus ([place]) Assessment & Plan: Encourage heart healthy diet such as MIND or DASH diet, increase exercise, avoid trans fats, simple carbohydrates and processed foods, consider a krill or fish or flaxseed oil cap daily.    Orders: -     Lipid panel -     CBC with Differential/Platelet -     Comprehensive metabolic panel -     Hemoglobin A1c -     Microalbumin / creatinine urine ratio  Acute left-sided low back pain with sciatica, sciatica laterality unspecified  Primary hypertension Assessment & Plan: Well controlled,  no changes to meds. Encouraged heart healthy diet such as the DASH diet and exercise as tolerated.    Orders: -     Lipid panel -     CBC with Differential/Platelet -     Comprehensive metabolic panel -     Hemoglobin A1c -     Microalbumin / creatinine urine ratio  Former smoker -     Ambulatory Referral for Lung Cancer Scre     I,Alexander Ruley,acting as a Education administrator for [employer], DO.,have documented all relevant documentation on the behalf of Ann Held, DO,as directed by  Ann Held, DO while in the presence of Ann Held, DO.  IAnn Held, DO, personally preformed the services described in this documentation.  All medical record entries made by the scribe were at my direction and in my presence.  I have reviewed the chart and discharge instructions (if applicable) and agree that the record reflects my personal performance and is accurate and complete. 03/09/22   Ann Held, DO

## 2022-03-09 ENCOUNTER — Ambulatory Visit (INDEPENDENT_AMBULATORY_CARE_PROVIDER_SITE_OTHER): Payer: Medicare PPO | Admitting: Family Medicine

## 2022-03-09 ENCOUNTER — Encounter: Payer: Self-pay | Admitting: Family Medicine

## 2022-03-09 ENCOUNTER — Telehealth: Payer: Self-pay

## 2022-03-09 VITALS — BP 110/78 | HR 90 | Temp 97.8°F | Resp 18 | Ht 62.0 in | Wt 176.4 lb

## 2022-03-09 DIAGNOSIS — M5440 Lumbago with sciatica, unspecified side: Secondary | ICD-10-CM

## 2022-03-09 DIAGNOSIS — Z0000 Encounter for general adult medical examination without abnormal findings: Secondary | ICD-10-CM

## 2022-03-09 DIAGNOSIS — Z87891 Personal history of nicotine dependence: Secondary | ICD-10-CM

## 2022-03-09 DIAGNOSIS — E785 Hyperlipidemia, unspecified: Secondary | ICD-10-CM | POA: Diagnosis not present

## 2022-03-09 DIAGNOSIS — I10 Essential (primary) hypertension: Secondary | ICD-10-CM

## 2022-03-09 DIAGNOSIS — M48061 Spinal stenosis, lumbar region without neurogenic claudication: Secondary | ICD-10-CM | POA: Diagnosis not present

## 2022-03-09 DIAGNOSIS — E1165 Type 2 diabetes mellitus with hyperglycemia: Secondary | ICD-10-CM | POA: Diagnosis not present

## 2022-03-09 DIAGNOSIS — E1169 Type 2 diabetes mellitus with other specified complication: Secondary | ICD-10-CM | POA: Diagnosis not present

## 2022-03-09 LAB — MICROALBUMIN / CREATININE URINE RATIO
Creatinine,U: 328 mg/dL
Microalb Creat Ratio: 1.1 mg/g (ref 0.0–30.0)
Microalb, Ur: 3.8 mg/dL — ABNORMAL HIGH (ref 0.0–1.9)

## 2022-03-09 NOTE — Patient Instructions (Signed)
Preventive Care 65 Years and Older, Female Preventive care refers to lifestyle choices and visits with your health care provider that can promote health and wellness. Preventive care visits are also called wellness exams. What can I expect for my preventive care visit? Counseling Your health care provider may ask you questions about your: Medical history, including: Past medical problems. Family medical history. Pregnancy and menstrual history. History of falls. Current health, including: Memory and ability to understand (cognition). Emotional well-being. Home life and relationship well-being. Sexual activity and sexual health. Lifestyle, including: Alcohol, nicotine or tobacco, and drug use. Access to firearms. Diet, exercise, and sleep habits. Work and work environment. Sunscreen use. Safety issues such as seatbelt and bike helmet use. Physical exam Your health care provider will check your: Height and weight. These may be used to calculate your BMI (body mass index). BMI is a measurement that tells if you are at a healthy weight. Waist circumference. This measures the distance around your waistline. This measurement also tells if you are at a healthy weight and may help predict your risk of certain diseases, such as type 2 diabetes and high blood pressure. Heart rate and blood pressure. Body temperature. Skin for abnormal spots. What immunizations do I need?  Vaccines are usually given at various ages, according to a schedule. Your health care provider will recommend vaccines for you based on your age, medical history, and lifestyle or other factors, such as travel or where you work. What tests do I need? Screening Your health care provider may recommend screening tests for certain conditions. This may include: Lipid and cholesterol levels. Hepatitis C test. Hepatitis B test. HIV (human immunodeficiency virus) test. STI (sexually transmitted infection) testing, if you are at  risk. Lung cancer screening. Colorectal cancer screening. Diabetes screening. This is done by checking your blood sugar (glucose) after you have not eaten for a while (fasting). Mammogram. Talk with your health care provider about how often you should have regular mammograms. BRCA-related cancer screening. This may be done if you have a family history of breast, ovarian, tubal, or peritoneal cancers. Bone density scan. This is done to screen for osteoporosis. Talk with your health care provider about your test results, treatment options, and if necessary, the need for more tests. Follow these instructions at home: Eating and drinking  Eat a diet that includes fresh fruits and vegetables, whole grains, lean protein, and low-fat dairy products. Limit your intake of foods with high amounts of sugar, saturated fats, and salt. Take vitamin and mineral supplements as recommended by your health care provider. Do not drink alcohol if your health care provider tells you not to drink. If you drink alcohol: Limit how much you have to 0-1 drink a day. Know how much alcohol is in your drink. In the [place] one drink equals one 12 oz bottle of beer (355 mL), one 5 oz glass of wine (148 mL), or one 1 oz glass of hard liquor (44 mL). Lifestyle Brush your teeth every morning and night with fluoride toothpaste. Floss one time each day. Exercise for at least 30 minutes 5 or more days each week. Do not use any products that contain nicotine or tobacco. These products include cigarettes, chewing tobacco, and vaping devices, such as e-cigarettes. If you need help quitting, ask your health care provider. Do not use drugs. If you are sexually active, practice safe sex. Use a condom or other form of protection in order to prevent STIs. Take aspirin only as told by   your health care provider. Make sure that you understand how much to take and what form to take. Work with your health care provider to find out whether it  is safe and beneficial for you to take aspirin daily. Ask your health care provider if you need to take a cholesterol-lowering medicine (statin). Find healthy ways to manage stress, such as: Meditation, yoga, or listening to music. Journaling. Talking to a trusted person. Spending time with friends and family. Minimize exposure to UV radiation to reduce your risk of skin cancer. Safety Always wear your seat belt while driving or riding in a vehicle. Do not drive: If you have been drinking alcohol. Do not ride with someone who has been drinking. When you are tired or distracted. While texting. If you have been using any mind-altering substances or drugs. Wear a helmet and other protective equipment during sports activities. If you have firearms in your house, make sure you follow all gun safety procedures. What's next? Visit your health care provider once a year for an annual wellness visit. Ask your health care provider how often you should have your eyes and teeth checked. Stay up to date on all vaccines. This information is not intended to replace advice given to you by your health care provider. Make sure you discuss any questions you have with your health care provider. Document Revised: 08/03/2020 Document Reviewed: 08/03/2020 Elsevier Patient Education  2023 Elsevier Inc.  

## 2022-03-09 NOTE — Assessment & Plan Note (Signed)
hgba1c to be checked minimize simple carbs. Increase exercise as tolerated. Continue current meds 

## 2022-03-09 NOTE — Assessment & Plan Note (Signed)
Well controlled, no changes to meds. Encouraged heart healthy diet such as the DASH diet and exercise as tolerated.  °

## 2022-03-09 NOTE — Assessment & Plan Note (Signed)
Ghm utd Check labs  See AVS  

## 2022-03-09 NOTE — Assessment & Plan Note (Signed)
Encourage heart healthy diet such as MIND or DASH diet, increase exercise, avoid trans fats, simple carbohydrates and processed foods, consider a krill or fish or flaxseed oil cap daily.  °

## 2022-03-09 NOTE — Addendum Note (Signed)
Addended by: Manuela Schwartz on: 03/09/2022 12:51 PM   Modules accepted: Orders

## 2022-03-09 NOTE — Telephone Encounter (Signed)
Called left message for patient to return call to office she needs to schedule a lab appointment patient left without doing lab work.

## 2022-03-12 ENCOUNTER — Telehealth: Payer: Self-pay

## 2022-03-12 NOTE — Telephone Encounter (Signed)
Caller Name Pandora Mccrackin [place] Phone Number [phone] Patient Name Sierra Ford Patient DOB April 03, 1940 Call Type Message Only Information Provided Reason for Call Request for General Office Information Initial Comment Caller states she missed a call from the office. She left a urine sample but was unaware that she was supposed to go to the lab. Disp. Time Disposition Final User 03/09/2022 5:13:34 PM General Information Provided Yes Georgina Snell Call Closed By: Georgina Snell Transaction Date/Time: 03/09/2022 5:10:57 PM (ET)

## 2022-03-12 NOTE — Telephone Encounter (Signed)
Spoke with patient in reference to labs from office visit 02/27/2022 and she states she was not aware of labs. Patient is scheduled for 03/22/2022 at 11:00 am.

## 2022-03-19 DIAGNOSIS — M545 Low back pain, unspecified: Secondary | ICD-10-CM | POA: Diagnosis not present

## 2022-03-19 DIAGNOSIS — M48061 Spinal stenosis, lumbar region without neurogenic claudication: Secondary | ICD-10-CM | POA: Diagnosis not present

## 2022-03-20 ENCOUNTER — Ambulatory Visit: Payer: Medicare PPO | Admitting: Family Medicine

## 2022-03-21 DIAGNOSIS — M545 Low back pain, unspecified: Secondary | ICD-10-CM | POA: Diagnosis not present

## 2022-03-21 DIAGNOSIS — M48061 Spinal stenosis, lumbar region without neurogenic claudication: Secondary | ICD-10-CM | POA: Diagnosis not present

## 2022-03-22 ENCOUNTER — Other Ambulatory Visit (INDEPENDENT_AMBULATORY_CARE_PROVIDER_SITE_OTHER): Payer: Medicare PPO

## 2022-03-22 ENCOUNTER — Other Ambulatory Visit (HOSPITAL_BASED_OUTPATIENT_CLINIC_OR_DEPARTMENT_OTHER): Payer: Self-pay

## 2022-03-22 DIAGNOSIS — E785 Hyperlipidemia, unspecified: Secondary | ICD-10-CM | POA: Diagnosis not present

## 2022-03-22 DIAGNOSIS — I10 Essential (primary) hypertension: Secondary | ICD-10-CM

## 2022-03-22 DIAGNOSIS — M48061 Spinal stenosis, lumbar region without neurogenic claudication: Secondary | ICD-10-CM

## 2022-03-22 DIAGNOSIS — E1169 Type 2 diabetes mellitus with other specified complication: Secondary | ICD-10-CM | POA: Diagnosis not present

## 2022-03-22 DIAGNOSIS — E1165 Type 2 diabetes mellitus with hyperglycemia: Secondary | ICD-10-CM

## 2022-03-22 LAB — CBC WITH DIFFERENTIAL/PLATELET
Basophils Absolute: 0 10*3/uL (ref 0.0–0.1)
Basophils Relative: 0.7 % (ref 0.0–3.0)
Eosinophils Absolute: 0.1 10*3/uL (ref 0.0–0.7)
Eosinophils Relative: 1.7 % (ref 0.0–5.0)
HCT: 40.6 % (ref 36.0–46.0)
Hemoglobin: 13.7 g/dL (ref 12.0–15.0)
Lymphocytes Relative: 41.2 % (ref 12.0–46.0)
Lymphs Abs: 1.8 10*3/uL (ref 0.7–4.0)
MCHC: 33.7 g/dL (ref 30.0–36.0)
MCV: 99.8 fl (ref 78.0–100.0)
Monocytes Absolute: 0.5 10*3/uL (ref 0.1–1.0)
Monocytes Relative: 10.6 % (ref 3.0–12.0)
Neutro Abs: 2 10*3/uL (ref 1.4–7.7)
Neutrophils Relative %: 45.8 % (ref 43.0–77.0)
Platelets: 332 10*3/uL (ref 150.0–400.0)
RBC: 4.07 Mil/uL (ref 3.87–5.11)
RDW: 14.4 % (ref 11.5–15.5)
WBC: 4.3 10*3/uL (ref 4.0–10.5)

## 2022-03-22 LAB — COMPREHENSIVE METABOLIC PANEL
ALT: 21 U/L (ref 0–35)
AST: 19 U/L (ref 0–37)
Albumin: 4.4 g/dL (ref 3.5–5.2)
Alkaline Phosphatase: 80 U/L (ref 39–117)
BUN: 8 mg/dL (ref 6–23)
CO2: 28 mEq/L (ref 19–32)
Calcium: 9.7 mg/dL (ref 8.4–10.5)
Chloride: 104 mEq/L (ref 96–112)
Creatinine, Ser: 0.67 mg/dL (ref 0.40–1.20)
GFR: 82.5 mL/min (ref >60.00)
Glucose, Bld: 95 mg/dL (ref 70–99)
Potassium: 4.2 mEq/L (ref 3.5–5.1)
Sodium: 143 mEq/L (ref 135–145)
Total Bilirubin: 0.8 mg/dL (ref 0.2–1.2)
Total Protein: 6.6 g/dL (ref 6.0–8.3)

## 2022-03-22 LAB — HEMOGLOBIN A1C: Hgb A1c MFr Bld: 6.3 % (ref 4.6–6.5)

## 2022-03-22 LAB — LIPID PANEL
Cholesterol: 188 mg/dL (ref 0–200)
HDL: 119.6 mg/dL (ref >39.00)
LDL Cholesterol: 49 mg/dL (ref 0–99)
NonHDL: 68.34
Total CHOL/HDL Ratio: 2
Triglycerides: 97 mg/dL (ref 0.0–149.0)
VLDL: 19.4 mg/dL (ref 0.0–40.0)

## 2022-03-27 ENCOUNTER — Ambulatory Visit (INDEPENDENT_AMBULATORY_CARE_PROVIDER_SITE_OTHER): Payer: Medicare PPO | Admitting: Pharmacist

## 2022-03-27 DIAGNOSIS — E785 Hyperlipidemia, unspecified: Secondary | ICD-10-CM

## 2022-03-27 DIAGNOSIS — M48061 Spinal stenosis, lumbar region without neurogenic claudication: Secondary | ICD-10-CM | POA: Diagnosis not present

## 2022-03-27 DIAGNOSIS — M545 Low back pain, unspecified: Secondary | ICD-10-CM | POA: Diagnosis not present

## 2022-03-27 DIAGNOSIS — E1169 Type 2 diabetes mellitus with other specified complication: Secondary | ICD-10-CM

## 2022-03-27 DIAGNOSIS — E1165 Type 2 diabetes mellitus with hyperglycemia: Secondary | ICD-10-CM

## 2022-03-27 NOTE — Progress Notes (Deleted)
Subjective:   By signing my name below, I, Daiva Huge, attest that this documentation has been prepared under the direction and in the presence of [facility] PHARMACIST 03/27/22   Patient ID: Sierra Ford, female    DOB: 05/12/1940, 82 y.o.   MRN: [id number]  Chief Complaint  Patient presents with   Diabetes   Hyperlipidemia   Medication Management    HPI Patient is in today for a comprehensive physical exam.  Spinal Stenosis She is experiencing sciatica. She has consulted multiple specialists and visited the ED with no relief.  Using a heated blanket gives some relief.  She receives regular Cortizone injections from her sports medicine doctor. She does not want to continue with Cortizone injections. She is uncertain regarding whether she wants to pursue surgical intervention to relieve sciatica. Her Gabapentin does not give her pain relief. She is interested in restarting physical therapy.  Diabetes She reports a Blood Sugar level of 121 this morning and 157 on Tuesday morning. Otherwise, it has been stable. Lab Results  Component Value Date   HGBA1C 6.3 03/22/2022    Sleep Deprivation She reports nocturnal arousal decreasing to once per night instead of 5 times after having a sleep study.  She is UTD on eye and dental care.  Colonoscopy: Last completed 07/17/2017.  Dexa: Overdue. Last completed 07/15/2019. Repeat in 2 years. Next scan postponed until 06/20/2022. Mammogram: Last completed on 10/20/2021. Repeat in 1 year. Lung Cancer Screening: Overdue. Last completed on 01/27/2019. Repeat in 1 year.  She reports smoking cessation in 2011.   Past Medical History:  Diagnosis Date   Anxiety    Back pain    Chest pain    CLite with apical ischemia in 2006 - normal coronary arteries by [facility] in 12/2004;  Myoview 11/12:  Low risk stress nuclear study with a small, partially reversible apical defect most likely related to apical thinning; cannot R/O very mild apical  ischemia.  EF: 75%    Decreased hearing    Depression    Diabetes ([place])    DVT (deep venous thrombosis) ([place]) 2006   hx of   Ectopic pregnancy with intrauterine pregnancy    Hiatal hernia    Hyperlipidemia    PE (pulmonary embolism) 2006   hx of   Sleep apnea    CPAP     Past Surgical History:  Procedure Laterality Date   ABDOMINAL EXPLORATION SURGERY     BUNIONECTOMY     right   COLONOSCOPY  2009   ECTOPIC PREGNANCY SURGERY     FOOT SURGERY     TUBAL LIGATION      Family History  Problem Relation Age of Onset   Heart failure Mother        died from   Colon polyps Mother    Hypertension Mother    Sudden death Mother    Obesity Mother    Alcoholism Father    Diabetes Maternal Aunt        grandaughter   Diabetes Maternal Aunt    Colon cancer Neg Hx    Esophageal cancer Neg Hx    Stomach cancer Neg Hx     Social History   Socioeconomic History   Marital status: Divorced    Spouse name: Not on file   Number of children: Not on file   Years of education: Not on file   Highest education level: Not on file  Occupational History   Occupation: Retired Scientist, research (medical)  Tobacco Use  Smoking status: Former    Packs/day: 1.00    Years: 50.00    Total pack years: 50.00    Types: Cigarettes    Quit date: 11/21/2009    Years since quitting: 12.3   Smokeless tobacco: Never  Vaping Use   Vaping Use: Never used  Substance and Sexual Activity   Alcohol use: Yes    Comment: rare 3 times a year per pt   Drug use: No   Sexual activity: Not Currently    Partners: Male  Other Topics Concern   Not on file  Social History Narrative   Regular exercise--- no   Social Determinants of Health   Financial Resource Strain: Medium Risk (03/20/2021)   Overall Financial Resource Strain (CARDIA)    Difficulty of Paying Living Expenses: Somewhat hard  Food Insecurity: No Food Insecurity (02/13/2022)   Hunger Vital Sign    Worried About Running Out of Food in the Last Year:  Never true    Ran Out of Food in the Last Year: Never true  Transportation Needs: No Transportation Needs (02/13/2022)   PRAPARE - Hydrologist (Medical): No    Lack of Transportation (Non-Medical): No  Physical Activity: Sufficiently Active (08/15/2020)   Exercise Vital Sign    Days of Exercise per Week: 4 days    Minutes of Exercise per Session: 60 min  Stress: No Stress Concern Present (08/15/2020)   [place]    Feeling of Stress : Only a little  Social Connections: Moderately Integrated (08/15/2020)   Social Connection and Isolation Panel [NHANES]    Frequency of Communication with Friends and Family: More than three times a week    Frequency of Social Gatherings with Friends and Family: More than three times a week    Attends Religious Services: More than 4 times per year    Active Member of [employer] or Organizations: Yes    Attends Archivist Meetings: 1 to 4 times per year    Marital Status: Widowed  Intimate Partner Violence: Not At Risk (08/15/2020)   Humiliation, Afraid, Rape, and Kick questionnaire    Fear of Current or Ex-Partner: No    Emotionally Abused: No    Physically Abused: No    Sexually Abused: No    Outpatient Medications Prior to Visit  Medication Sig Dispense Refill   apixaban (ELIQUIS) 2.5 MG TABS tablet Take 1 tablet (2.5 mg total) by mouth 2 (two) times daily. 60 tablet 5   Evolocumab (REPATHA SURECLICK) 170 MG/ML SOAJ Inject 140 mg into the skin every 14 (fourteen) days. 2 mL 11   furosemide (LASIX) 40 MG tablet TAKE 1 TABLET DAILY (Patient taking differently: Take 40 mg by mouth in the morning.) 90 tablet 2   gabapentin (NEURONTIN) 300 MG capsule Take 300 mg by mouth at bedtime.     Insulin Pen Needle (NOVOFINE PLUS PEN NEEDLE) 32G X 4 MM MISC As directed 100 each 1   Multiple Vitamin (MULTIVITAMIN) capsule Take 1 capsule by mouth daily with breakfast.      potassium chloride SA (KLOR-CON M) 20 MEQ tablet Take 1 tablet (20 mEq total) by mouth daily. 90 tablet 3   rosuvastatin (CRESTOR) 10 MG tablet Take 1 tablet (10 mg total) by mouth daily. 90 tablet 2   Semaglutide, 1 MG/DOSE, 4 MG/3ML SOPN Inject 1 mg as directed once a week. (Patient taking differently: Inject 1 mg as directed every Tuesday.) 3  mL 3   blood glucose meter kit and supplies KIT Dispense based on patient and insurance preference. Use up to four times daily as directed. (FOR ICD-9 250.00, 250.01). 1 each 0   Calcium Carb-Cholecalciferol (CALCIUM + D3 PO) Take 1 tablet by mouth daily.     cholecalciferol (VITAMIN D3) 10 MCG (400 UNIT) TABS tablet Take 400 Units by mouth 2 (two) times daily.     Coenzyme Q10 100 MG TABS Take 100 mg by mouth daily.     Flaxseed, Linseed, (FLAXSEED OIL PO) Take 1,000 mg by mouth daily.      hydrocortisone (ANUSOL-HC) 25 MG suppository Place 1 suppository (25 mg total) rectally every 12 (twelve) hours. (Patient taking differently: Place 25 mg rectally 2 (two) times daily as needed for hemorrhoids.) 12 suppository 1   Omega-3 Fatty Acids (FISH OIL) 1000 MG CAPS Take 1,000 mg by mouth daily.     TRUE METRIX BLOOD GLUCOSE TEST test strip TEST  UP  TO FOUR TIMES DAILY AS DIRECTED (Patient not taking: Reported on 03/27/2022) 200 each 12   TRUEPLUS LANCETS 33G MISC TEST  UP TO FOUR TIMES DAILY AS DIRECTED (Patient not taking: Reported on 03/27/2022) 200 each 12   No facility-administered medications prior to visit.    No Known Allergies  Review of Systems  Constitutional:  Negative for fever and malaise/fatigue.  HENT:  Negative for congestion, sinus pain and sore throat.   Eyes:  Negative for blurred vision.  Respiratory:  Negative for cough, hemoptysis, shortness of breath and wheezing.   Cardiovascular:  Negative for chest pain, palpitations and leg swelling.  Gastrointestinal:  Negative for abdominal pain, constipation, diarrhea, nausea and vomiting.   Genitourinary:  Negative for flank pain, hematuria and urgency.  Musculoskeletal:  Positive for myalgias (Pain in groin, legs, lower back from sciatica). Negative for back pain.  Skin:  Negative for rash.  Neurological:  Positive for weakness (Bilateral legs). Negative for loss of consciousness and headaches.       (+) Sciatica       Objective:    Physical Exam Vitals and nursing note reviewed.  Constitutional:      General: She is not in acute distress.    Appearance: Normal appearance. She is not ill-appearing.  HENT:     Head: Normocephalic and atraumatic.     Right Ear: Tympanic membrane, ear canal and external ear normal.     Left Ear: Tympanic membrane, ear canal and external ear normal.     Nose: Nose normal. No congestion or rhinorrhea.     Mouth/Throat:     Mouth: Mucous membranes are moist.     Pharynx: No posterior oropharyngeal erythema.  Eyes:     Extraocular Movements: Extraocular movements intact.     Conjunctiva/sclera: Conjunctivae normal.     Pupils: Pupils are equal, round, and reactive to light.  Cardiovascular:     Rate and Rhythm: Normal rate and regular rhythm.     Heart sounds: Normal heart sounds. No murmur heard.    No gallop.  Pulmonary:     Effort: Pulmonary effort is normal. No respiratory distress.     Breath sounds: Normal breath sounds. No wheezing or rales.  Abdominal:     General: Bowel sounds are normal. There is no distension.     Palpations: Abdomen is soft.     Tenderness: There is no abdominal tenderness. There is no right CVA tenderness or guarding.  Musculoskeletal:        General:  Normal range of motion.     Cervical back: Normal range of motion and neck supple.  Skin:    General: Skin is warm and dry.  Neurological:     General: No focal deficit present.     Mental Status: She is alert and oriented to person, place, and time.  Psychiatric:        Mood and Affect: Mood normal.        Judgment: Judgment normal.     There  were no vitals taken for this visit. Wt Readings from Last 3 Encounters:  03/09/22 176 lb 6.4 oz (80 kg)  03/07/22 173 lb (78.5 kg)  02/20/22 172 lb (78 kg)       Assessment & Plan:  There are no diagnoses linked to this encounter.    I,Alexander Ruley,acting as a scribe for [facility] PHARMACIST.,have documented all relevant documentation on the behalf of [facility] PHARMACIST,as directed by  [facility] PHARMACIST while in the presence of [facility] PHARMACIST.   I, Elizebath Wever, RPH-CPP, personally preformed the services described in this documentation.  All medical record entries made by the scribe were at my direction and in my presence.  I have reviewed the chart and discharge instructions (if applicable) and agree that the record reflects my personal performance and is accurate and complete. 03/27/22   Cherre Robins, RPH-CPP

## 2022-03-27 NOTE — Patient Instructions (Signed)
Mrs. Bembenek It was a pleasure speaking with you today.  Below is a summary of our recent visit.   Diabetes / weight   A1c at goal of < 7.0% Lab Results  Component Value Date   HGBA1C 6.3 03/22/2022    Continue Ozempic '1mg'$  weekly Reviewed blood glucose goals:              Fasting / before a meal - 80 to 130              Within 2 hours of eating - less than 180     Elevated cholesterol: LDL at goal of < 70  Lab Results  Component Value Date   CHOL 188 03/22/2022   HDL 119.60 03/22/2022   LDLCALC 49 03/22/2022   TRIG 97.0 03/22/2022   CHOLHDL 2 03/22/2022   Continue Repatha and rosuvastatin. Continue to follow heart health diet and exercise as able.  As always if you have any questions or concerns especially regarding medications, please feel free to contact me either at the phone number below or with a MyChart message.   Keep up the good work!  Cherre Robins, PharmD Clinical Pharmacist [facility] [phone] (direct line)  [phone] (main office number)  Heart-Healthy Eating Plan Eating a healthy diet is important for the health of your heart. A heart-healthy eating plan includes: Eating less unhealthy fats. Eating more healthy fats. Eating less salt in your food. Salt is also called sodium. Making other changes in your diet. Talk with your doctor or a diet specialist (dietitian) to create an eating plan that is right for you. What is my plan? Your doctor may recommend an eating plan that includes: Total fat: 30% or less of total calories a day. Saturated fat: 10% or less of total calories a day. Cholesterol: less than '300mg'$  a day. Sodium: less than '2300mg'$  a day. What are tips for following this plan? Cooking Avoid frying your food. Try to bake, boil, grill, or broil it instead. You can also reduce fat by: Removing the skin from poultry. Removing all visible fats from meats. Steaming vegetables in water or broth. Meal  planning  At meals, divide your plate into four equal parts: Fill one-half of your plate with vegetables and green salads. Fill one-fourth of your plate with whole grains. Fill one-fourth of your plate with lean protein foods. Eat 2-4 cups of vegetables per day. One cup of vegetables is: 1 cup (91 g) broccoli or cauliflower florets. 2 medium carrots. 1 large bell pepper. 1 large sweet potato. 1 large tomato. 1 medium white potato. 2 cups (150 g) raw leafy greens. Eat 1-2 cups of fruit per day. One cup of fruit is: 1 small apple 1 large banana 1 cup (237 g) mixed fruit, 1 large orange,  cup (82 g) dried fruit, 1 cup (240 mL) 100% fruit juice. Eat more foods that have soluble fiber. These are apples, broccoli, carrots, beans, peas, and barley. Try to get 20-30 g of fiber per day. Eat 4-5 servings of nuts, legumes, and seeds per week: 1 serving of dried beans or legumes equals  cup (90 g) cooked. 1 serving of nuts is  oz (12 almonds, 24 pistachios, or 7 walnut halves). 1 serving of seeds equals  oz (8 g). General information Eat more home-cooked food. Eat less restaurant, buffet, and fast food. Limit or avoid alcohol. Limit foods that are high in starch and sugar. Avoid fried foods. Lose weight if you  are overweight. Keep track of how much salt (sodium) you eat. This is important if you have high blood pressure. Ask your doctor to tell you more about this. Try to add vegetarian meals each week. Fats Choose healthy fats. These include olive oil and canola oil, flaxseeds, walnuts, almonds, and seeds. Eat more omega-3 fats. These include salmon, mackerel, sardines, tuna, flaxseed oil, and ground flaxseeds. Try to eat fish at least 2 times each week. Check food labels. Avoid foods with trans fats or high amounts of saturated fat. Limit saturated fats. These are often found in animal products, such as meats, butter, and cream. These are also found in plant foods, such as palm  oil, palm kernel oil, and coconut oil. Avoid foods with partially hydrogenated oils in them. These have trans fats. Examples are stick margarine, some tub margarines, cookies, crackers, and other baked goods. What foods should I eat? Fruits All fresh, canned (in natural juice), or frozen fruits. Vegetables Fresh or frozen vegetables (raw, steamed, roasted, or grilled). Green salads. Grains Most grains. Choose whole wheat and whole grains most of the time. Rice and pasta, including brown rice and pastas made with whole wheat. Meats and other proteins Lean, well-trimmed beef, veal, pork, and lamb. Chicken and [place] without skin. All fish and shellfish. Wild duck, rabbit, pheasant, and venison. Egg whites or low-cholesterol egg substitutes. Dried beans, peas, lentils, and tofu. Seeds and most nuts. Dairy Low-fat or nonfat cheeses, including ricotta and mozzarella. Skim or 1% milk that is liquid, powdered, or evaporated. Buttermilk that is made with low-fat milk. Nonfat or low-fat yogurt. Fats and oils Non-hydrogenated (trans-free) margarines. Vegetable oils, including soybean, sesame, sunflower, olive, peanut, safflower, corn, canola, and cottonseed. Salad dressings or mayonnaise made with a vegetable oil. Beverages Mineral water. Coffee and tea. Diet carbonated beverages. Sweets and desserts Sherbet, gelatin, and fruit ice. Small amounts of dark chocolate. Limit all sweets and desserts. Seasonings and condiments All seasonings and condiments. The items listed above may not be a complete list of foods and drinks you can eat. Contact a dietitian for more options. What foods should I avoid? Fruits Canned fruit in heavy syrup. Fruit in cream or butter sauce. Fried fruit. Limit coconut. Vegetables Vegetables cooked in cheese, cream, or butter sauce. Fried vegetables. Grains Breads that are made with saturated or trans fats, oils, or whole milk. Croissants. Sweet rolls. Donuts. High-fat  crackers, such as cheese crackers. Meats and other proteins Fatty meats, such as hot dogs, ribs, sausage, bacon, rib-eye roast or steak. High-fat deli meats, such as salami and bologna. Caviar. Domestic duck and goose. Organ meats, such as liver. Dairy Cream, sour cream, cream cheese, and creamed cottage cheese. Whole-milk cheeses. Whole or 2% milk that is liquid, evaporated, or condensed. Whole buttermilk. Cream sauce or high-fat cheese sauce. Yogurt that is made from whole milk. Fats and oils Meat fat, or shortening. Cocoa butter, hydrogenated oils, palm oil, coconut oil, palm kernel oil. Solid fats and shortenings, including bacon fat, salt pork, lard, and butter. Nondairy cream substitutes. Salad dressings with cheese or sour cream. Beverages Regular sodas and juice drinks with added sugar. Sweets and desserts Frosting. Pudding. Cookies. Cakes. Pies. Milk chocolate or white chocolate. Buttered syrups. Full-fat ice cream or ice cream drinks. The items listed above may not be a complete list of foods and drinks to avoid. Contact a dietitian for more information. Summary Heart-healthy meal planning includes eating less unhealthy fats, eating more healthy fats, and making other changes in your  diet. Eat a balanced diet. This includes fruits and vegetables, low-fat or nonfat dairy, lean protein, nuts and legumes, whole grains, and heart-healthy oils and fats. This information is not intended to replace advice given to you by your health care provider. Make sure you discuss any questions you have with your health care provider. Document Revised: 03/13/2021 Document Reviewed: 03/13/2021 Elsevier Patient Education  [place].

## 2022-03-27 NOTE — Progress Notes (Signed)
Pharmacy Note  03/27/2022 Name: [name] MRN: [id number] DOB: [birth year]  Subjective: Sierra Ford is a 82 y.o. year old female who is a primary care patient of Sierra Ford, Alferd Apa, DO. Clinical Pharmacist Practitioner referral was placed to assist with medication, diabetes and hyperlipidemia management.    Engaged with patient by telephone for follow up visit today.  Type 2 DM:  Current medications: Ozempic 7m weekly on Tuesdays.  Patient end[place] [place] She has a dose for today and next week and then will need another refill. Cost on her [employer] is $250 / 28 days. Has a copay card [facility] [employer] [facility] by $150 per month. She has also been using [employer] off set cost - last month they covered rest of $150 copay so patient paid $0 but she has only $96 left on this fund. Currently the fund is closed for both re-applicants and new applicants.    Patient has glucometer at home but only check 2 or 3 times per month.  Starting weight prior to Ozempic = 193lbs.  Current weight = 176lbs Total amount of weight loss = 17lbs  CHF / Edema: Taking furosemide 476mdaily (expect when she is away from home) and potassium chloride 20 mEq daily. Last EF was 55 to 60% (03/03/2019).  She states LEE has improved. Denies shortness of breath.    Hyperlipidemia: Praulent has been change to Repatha due to insurance preferance. She is also taking rosuvastatin. Patient endorses that cost of Repatha has been $5 / 28 days and rosuvastatin $0.  Recent lipid panel was great - LDL was at goal   She is getting exercise thru physical therapy but not much outside of PT due to leg pain / weakness. She reports that she is taking gabapentin 30062mt bedtime - prescribed by orthopedist.   Objective: Review of patient status, including review of consultants reports, laboratory and other test data, was  performed as part of comprehensive evaluation and provision of chronic care management services.   Lab Results  Component Value Date   CREATININE 0.67 03/22/2022   CREATININE 0.89 11/08/2021   CREATININE 0.70 09/04/2021    Lab Results  Component Value Date   HGBA1C 6.3 03/22/2022       Component Value Date/Time   CHOL 188 03/22/2022 1123   CHOL 209 (H) 08/26/2018 1018   TRIG 97.0 03/22/2022 1123   HDL 119.60 03/22/2022 1123   HDL 101 08/26/2018 1018   CHOLHDL 2 03/22/2022 1123   VLDL 19.4 03/22/2022 1123   LDLCALC 49 03/22/2022 1123   LDLCALC 69 11/10/2019 1122   LDLDIRECT 95.9 10/06/2012 1248     Clinical ASCVD: Yes  The ASCVD Risk score (Arnett DK, et al., 2019) failed to calculate for the following reasons:   The 2019 ASCVD risk score is only valid for ages 40 58 79 56 BP Readings from Last 3 Encounters:  03/09/22 110/78  03/07/22 118/74  02/20/22 130/82     No Known Allergies  Medications Reviewed Today     Reviewed by EckCherre RobinsPH-CPP (Pharmacist) on 03/27/22 at 1032  Med List Status: <None>   Medication Order Taking? Sig Documenting Provider Last Dose Status Informant  apixaban (ELIQUIS) 2.5 MG TABS tablet [id number] Take 1 tablet (2.5 mg total) by mouth 2 (two) times daily. HocMinus BreedingD Taking Active Self  blood glucose meter  kit and supplies KIT [id number]  Dispense based on patient and insurance preference. Use up to four times daily as directed. (FOR ICD-9 250.00, 250.01). Ann Held, DO  Active Self  Calcium Carb-Cholecalciferol (CALCIUM + D3 PO) [id number]  Take 1 tablet by mouth daily. [provider]  Active Self  cholecalciferol (VITAMIN D3) 10 MCG (400 UNIT) TABS tablet [id number]  Take 400 Units by mouth 2 (two) times daily. [provider]  Active Self  Coenzyme Q10 100 MG TABS [id number]  Take 100 mg by mouth daily. [provider]  Active Self  Evolocumab (REPATHA SURECLICK) [id number] MG/ML SOAJ  [id number] Yes Inject 140 mg into the skin every 14 (fourteen) days. Minus Breeding, MD Taking Active Self  Flaxseed, Linseed, (FLAXSEED OIL PO) [id number]  Take 1,000 mg by mouth daily.  [provider]  Active Self  furosemide (LASIX) 40 MG tablet [id number] Yes TAKE 1 TABLET DAILY  Patient taking differently: Take 40 mg by mouth in the morning.   Minus Breeding, MD Taking Active Self           Med Note Lia Hopping, MINDY L   Wed Nov 08, 2021  9:21 AM)    gabapentin (NEURONTIN) 300 MG capsule [id number] Yes Take 300 mg by mouth at bedtime. [provider] Taking Active   hydrocortisone (ANUSOL-HC) 25 MG suppository [id number]  Place 1 suppository (25 mg total) rectally every 12 (twelve) hours.  Patient taking differently: Place 25 mg rectally 2 (two) times daily as needed for hemorrhoids.   Roma Schanz R, DO  Active Self  Insulin Pen Needle (NOVOFINE PLUS PEN NEEDLE) 32G X 4 MM MISC [id number] Yes As directed Ann Held, DO Taking Active Self  Multiple Vitamin (MULTIVITAMIN) capsule [id number] Yes Take 1 capsule by mouth daily with breakfast. [provider] Taking Active Self    Discontinued 05/07/11 1124 (Discontinued by provider) Omega-3 Fatty Acids (FISH OIL) 1000 MG CAPS [id number]  Take 1,000 mg by mouth daily. [provider]  Active Self  potassium chloride SA (KLOR-CON M) 20 MEQ tablet [id number] Yes Take 1 tablet (20 mEq total) by mouth daily. Finis Bud, NP Taking Active Self  Discontinued 05/07/11 1124 (Change in therapy) rosuvastatin (CRESTOR) 10 MG tablet [id number] Yes Take 1 tablet (10 mg total) by mouth daily. Minus Breeding, MD Taking Active Self  Semaglutide, 1 MG/DOSE, 4 MG/3ML SOPN [id number] Yes Inject 1 mg as directed once a week.  Patient taking differently: Inject 1 mg as directed every Tuesday.   Sierra Ford, Kendrick Fries R, DO Taking Active Self  TRUE METRIX BLOOD GLUCOSE TEST test strip [id number] No TEST  UP  TO FOUR TIMES  DAILY AS DIRECTED  Patient not taking: Reported on 03/27/2022   Ann Held, DO Not Taking Active Self  TRUEPLUS LANCETS 33G [place] [id number] No TEST  UP TO FOUR TIMES DAILY AS DIRECTED  Patient not taking: Reported on 03/27/2022   Ann Held, DO Not Taking Active Self            Patient Active Problem List   Diagnosis Date Noted   Uncontrolled type 2 diabetes mellitus with hyperglycemia ([place]) 03/09/2022   Acute left-sided low back pain with sciatica 03/09/2022   Former smoker 03/09/2022   Primary osteoarthritis of left hip 02/20/2022   Spinal stenosis of lumbar region 02/20/2022   Rotator cuff arthropathy of both shoulders 02/20/2022   Lower abdominal pain 04/04/2021   Rectal bleeding 04/04/2021   Change  in bowel habits 04/04/2021   Internal hemorrhoids 04/04/2021   Preventative health care 03/07/2021   Sacroiliitis ([place]) 03/07/2021   Morbid (severe) obesity due to excess calories ([place]) 03/07/2021   Bronchitis 12/20/2020   Other pulmonary embolism with acute cor pulmonale, unspecified chronicity ([place])    Educated about COVID-19 virus infection 01/26/2019   Hyperlipidemia associated with type 2 diabetes mellitus ([place]) 04/24/2018   Right ankle swelling 05/13/2017   Diabetes mellitus, type II ([place]) 04/08/2017   Primary hypertension 04/08/2017   Atherosclerosis of aorta ([place]) 04/08/2017   Abnormal auditory perception of both ears 10/01/2016   Bilateral impacted cerumen 10/01/2016   OSA (obstructive sleep apnea) 12/16/2015   DOE (dyspnea on exertion) 06/12/2013   Obesity (BMI 30-39.9) 04/24/2013   Chest pain, unspecified 01/22/2011   Abnormal stress test 01/22/2011   Frequent urination 01/22/2011   Chronic diastolic heart failure ([place]) 05/15/2009   CAROTID ARTERY DISEASE 01/12/2009   LEG EDEMA, RIGHT 07/27/2008   HX, URINARY INFECTION 09/19/2006   Hyperlipidemia LDL goal <70 08/20/2006   PREGNANCY, ECTOPIC NEC W/INTRAUTERINE PRG 08/20/2006   FREQUENCY,  URINARY 08/20/2006   Other specified abnormal findings of blood chemistry 08/20/2006   Personal history of venous thrombosis and embolism 08/20/2006     Medication Assistance:   Patient has [employer] coverage for prescriptions. She is able to use discount coupon for Ozempic. Uses [employer] as well but only has $96 left on grant.    Assessment / Plan Type 2 DM / obesity: A1c at goal; has lost 17lbs since starting Ozempic Continue Ozempic 49m weekly Reviewed blood glucose goals:   Fasting 80 to 130   Within 2 hours of eating less than 180  Checked formulary alternatives at previous visit: Rybelsus, Mounjaro and Trulicity are also $[id number] month. Their savings card are all similar to Ozempic - max discount is $150 per 30 days so will lower cost to about $100 / month.   Hyperlipidemia: LDL at goal of < 70 Continue Repatha and rosuvastatin. Continue to follow heart health diet and exercise as able.   Medication Management:  Will continue to monitor for funding to help with cost of Ozempic.  Reviewed medications and updated med list to include gabapentin Reviewed refill history and discuss adherence.    Follow Up:  by phone in 4 to 6 weeks   TCherre Robins PharmD Clinical Pharmacist [phone]

## 2022-03-29 DIAGNOSIS — M48061 Spinal stenosis, lumbar region without neurogenic claudication: Secondary | ICD-10-CM | POA: Diagnosis not present

## 2022-03-29 DIAGNOSIS — M545 Low back pain, unspecified: Secondary | ICD-10-CM | POA: Diagnosis not present

## 2022-04-03 DIAGNOSIS — M48061 Spinal stenosis, lumbar region without neurogenic claudication: Secondary | ICD-10-CM | POA: Diagnosis not present

## 2022-04-03 DIAGNOSIS — M545 Low back pain, unspecified: Secondary | ICD-10-CM | POA: Diagnosis not present

## 2022-04-06 DIAGNOSIS — M545 Low back pain, unspecified: Secondary | ICD-10-CM | POA: Diagnosis not present

## 2022-04-06 DIAGNOSIS — M48061 Spinal stenosis, lumbar region without neurogenic claudication: Secondary | ICD-10-CM | POA: Diagnosis not present

## 2022-04-09 DIAGNOSIS — M545 Low back pain, unspecified: Secondary | ICD-10-CM | POA: Diagnosis not present

## 2022-04-09 DIAGNOSIS — M48061 Spinal stenosis, lumbar region without neurogenic claudication: Secondary | ICD-10-CM | POA: Diagnosis not present

## 2022-04-12 DIAGNOSIS — M545 Low back pain, unspecified: Secondary | ICD-10-CM | POA: Diagnosis not present

## 2022-04-12 DIAGNOSIS — M48061 Spinal stenosis, lumbar region without neurogenic claudication: Secondary | ICD-10-CM | POA: Diagnosis not present

## 2022-04-16 DIAGNOSIS — M545 Low back pain, unspecified: Secondary | ICD-10-CM | POA: Diagnosis not present

## 2022-04-16 DIAGNOSIS — M48061 Spinal stenosis, lumbar region without neurogenic claudication: Secondary | ICD-10-CM | POA: Diagnosis not present

## 2022-04-19 DIAGNOSIS — M545 Low back pain, unspecified: Secondary | ICD-10-CM | POA: Diagnosis not present

## 2022-04-19 DIAGNOSIS — M48061 Spinal stenosis, lumbar region without neurogenic claudication: Secondary | ICD-10-CM | POA: Diagnosis not present

## 2022-04-23 DIAGNOSIS — M545 Low back pain, unspecified: Secondary | ICD-10-CM | POA: Diagnosis not present

## 2022-04-23 DIAGNOSIS — M48061 Spinal stenosis, lumbar region without neurogenic claudication: Secondary | ICD-10-CM | POA: Diagnosis not present

## 2022-04-24 ENCOUNTER — Telehealth: Payer: Self-pay | Admitting: Family Medicine

## 2022-04-24 NOTE — Telephone Encounter (Signed)
Spoke with patient. She has used all all her funding from [employer] type 2 DM grant.  She has 1 pen of Ozempic '1mg'$  left but is not sure that she will be able to continue to afford Ozempic.  She has [employer] which lowers cost to $250 / 28 days. Has a copay card from [employer] which lowers by $150 per month.  Checked to see if [employer] has funding for 2024 for Type 2 DM fund but this fund is currently closed. Also check Patient Assistance Fund and no funding currently for T2DM.   Will check when in office tomorrow to see if there are any samples.

## 2022-04-24 NOTE — Telephone Encounter (Signed)
Pt would like a callback regarding ozempic. Please call to discuss.

## 2022-04-25 NOTE — Telephone Encounter (Signed)
I did not have any '1mg'$  Ozempic samples available. Patient states she has at least 2 more doses. Will check back in 2 weeks.

## 2022-04-26 DIAGNOSIS — M48061 Spinal stenosis, lumbar region without neurogenic claudication: Secondary | ICD-10-CM | POA: Diagnosis not present

## 2022-04-26 DIAGNOSIS — M545 Low back pain, unspecified: Secondary | ICD-10-CM | POA: Diagnosis not present

## 2022-05-02 DIAGNOSIS — M79606 Pain in leg, unspecified: Secondary | ICD-10-CM | POA: Insufficient documentation

## 2022-05-02 NOTE — Progress Notes (Signed)
Cardiology Office Note   Date:  05/03/2022   ID:  Sierra Ford, DOB 1940/03/01, MRN [id number]  PCP:  Carollee Herter, Sierra Apa, DO  Cardiologist:   Minus Breeding, MD  Chief Complaint  Patient presents with   Pulmonary Embolism    History of Present Illness: Sierra Ford is a 82 y.o. female who presents for follow up of diastolic dysfunction and abnormal echo. Echo 04/16/17  demonstrated an EF of 65% I saw her last year and she returns for follow up.   She had a pulmonary embolism.   She had RV strain which eventually recovered.    She had leg pain at the last visit.  Doppler was negative for DVT.  Since I last saw her she has had problems with sciatic nerve pain.  She otherwise has done okay from a cardiovascular standpoint.  She is limited in her activities because of this. The patient denies any new symptoms such as chest discomfort, neck or arm discomfort. There has been no new shortness of breath, PND or orthopnea. There have been no reported palpitations, presyncope or syncope.    Past Medical History:  Diagnosis Date   Anxiety    Back pain    Chest pain    CLite with apical ischemia in 2006 - normal coronary arteries by [facility] in 12/2004;  Myoview 11/12:  Low risk stress nuclear study with a small, partially reversible apical defect most likely related to apical thinning; cannot R/O very mild apical ischemia.  EF: 75%    Decreased hearing    Depression    Diabetes ([place])    DVT (deep venous thrombosis) ([place]) 2006   hx of   Ectopic pregnancy with intrauterine pregnancy    Hiatal hernia    Hyperlipidemia    PE (pulmonary embolism) 2006   hx of   Sleep apnea    CPAP     Past Surgical History:  Procedure Laterality Date   ABDOMINAL EXPLORATION SURGERY     BUNIONECTOMY     right   COLONOSCOPY  2009   ECTOPIC PREGNANCY SURGERY     FOOT SURGERY     TUBAL LIGATION       Current Outpatient Medications  Medication Sig Dispense Refill   apixaban (ELIQUIS) 2.5 MG  TABS tablet Take 1 tablet (2.5 mg total) by mouth 2 (two) times daily. 60 tablet 5   blood glucose meter kit and supplies KIT Dispense based on patient and insurance preference. Use up to four times daily as directed. (FOR ICD-9 250.00, 250.01). 1 each 0   Calcium Carb-Cholecalciferol (CALCIUM + D3 PO) Take 1 tablet by mouth daily.     cholecalciferol (VITAMIN D3) 10 MCG (400 UNIT) TABS tablet Take 400 Units by mouth 2 (two) times daily.     Coenzyme Q10 100 MG TABS Take 100 mg by mouth daily.     Evolocumab (REPATHA SURECLICK) [id number] MG/ML SOAJ Inject 140 mg into the skin every 14 (fourteen) days. 2 mL 11   Flaxseed, Linseed, (FLAXSEED OIL PO) Take 1,000 mg by mouth daily.      furosemide (LASIX) 40 MG tablet TAKE 1 TABLET DAILY (Patient taking differently: Take 40 mg by mouth in the morning.) 90 tablet 2   gabapentin (NEURONTIN) 300 MG capsule Take 300 mg by mouth at bedtime.     hydrocortisone (ANUSOL-HC) 25 MG suppository Place 1 suppository (25 mg total) rectally every 12 (twelve) hours. (Patient taking differently: Place 25 mg rectally 2 (two)  times daily as needed for hemorrhoids.) 12 suppository 1   Insulin Pen Needle (NOVOFINE PLUS PEN NEEDLE) 32G X 4 MM MISC As directed 100 each 1   Multiple Vitamin (MULTIVITAMIN) capsule Take 1 capsule by mouth daily with breakfast.     Omega-3 Fatty Acids (FISH OIL) 1000 MG CAPS Take 1,000 mg by mouth daily.     potassium chloride SA (KLOR-CON M) 20 MEQ tablet Take 1 tablet (20 mEq total) by mouth daily. 90 tablet 3   rosuvastatin (CRESTOR) 10 MG tablet Take 1 tablet (10 mg total) by mouth daily. 90 tablet 2   Semaglutide, 1 MG/DOSE, 4 MG/3ML SOPN Inject 1 mg as directed once a week. (Patient taking differently: Inject 1 mg as directed every Tuesday.) 3 mL 3   TRUE METRIX BLOOD GLUCOSE TEST test strip TEST  UP  TO FOUR TIMES DAILY AS DIRECTED 200 each 12   TRUEPLUS LANCETS 33G MISC TEST  UP TO FOUR TIMES DAILY AS DIRECTED 200 each 12   No current  facility-administered medications for this visit.    Allergies:   Patient has no known allergies.     ROS:  Please see the history of present illness.   Otherwise, review of systems are positive for none.   All other systems are reviewed and negative.    PHYSICAL EXAM: VS:  BP 128/72   Pulse 75   Ht '5\' 2"'$  (1.575 m)   Wt 177 lb (80.3 kg)   SpO2 96%   BMI 32.37 kg/m  , BMI Body mass index is 32.37 kg/m. GENERAL:  Well appearing NECK:  No jugular venous distention, waveform within normal limits, carotid upstroke brisk and symmetric, no bruits, no thyromegaly LUNGS:  Clear to auscultation bilaterally CHEST:  Unremarkable HEART:  PMI not displaced or sustained,S1 and S2 within normal limits, no S3, no S4, no clicks, no rubs, no murmurs ABD:  Flat, positive bowel sounds normal in frequency in pitch, no bruits, no rebound, no guarding, no midline pulsatile mass, no hepatomegaly, no splenomegaly EXT:  2 plus pulses throughout, no edema, no cyanosis no clubbing  EKG:  EKG is  ordered today. Sinus rhythm, rate 75, left ventricular hypertrophy by voltage criteria, left axis deviation, poor anterior R wave progression, no change from previous.   Recent Labs: 03/22/2022: ALT 21; BUN 8; Creatinine, Ser 0.67; Hemoglobin 13.7; Platelets 332.0; Potassium 4.2; Sodium 143    Lipid Panel    Component Value Date/Time   CHOL 188 03/22/2022 1123   CHOL 209 (H) 08/26/2018 1018   TRIG 97.0 03/22/2022 1123   HDL 119.60 03/22/2022 1123   HDL 101 08/26/2018 1018   CHOLHDL 2 03/22/2022 1123   VLDL 19.4 03/22/2022 1123   LDLCALC 49 03/22/2022 1123   LDLCALC 69 11/10/2019 1122   LDLDIRECT 95.9 10/06/2012 1248     Lab Results  Component Value Date   HGBA1C 6.3 03/22/2022     Wt Readings from Last 3 Encounters:  05/03/22 177 lb (80.3 kg)  03/09/22 176 lb 6.4 oz (80 kg)  03/07/22 173 lb (78.5 kg)      Other studies Reviewed: Additional studies/ records that were reviewed today include:    None Review of the above records demonstrates: NA   ASSESSMENT AND PLAN:  PULMONARY EMBOLISM:    She is on lifelong anticoagulation for unprovoked PE.   DIASTOLIC HF:   She seems to be euvolemic.  No change in therapy.  SLEEP APNEA:   She uses her CPAP.  No change in therapy.  DYSLIPIDEMIA:   LDL is 70.  She will remain on the meds as listed.  LEG PAIN:   She had no evidence of DVT.  This apparently is related to sciatica.  No further workup.  Current medicines are reviewed at length with the patient today.  The patient does not have concerns regarding medicines.  The following changes have been made: None  Labs/ tests ordered today include:     None  Orders Placed This Encounter  Procedures   EKG 12-Lead     Disposition:   FU with me in one year.    Signed, Minus Breeding, MD  05/03/2022 9:34 AM    [facility]

## 2022-05-03 ENCOUNTER — Ambulatory Visit: Payer: Medicare PPO | Attending: Cardiology | Admitting: Cardiology

## 2022-05-03 ENCOUNTER — Encounter: Payer: Self-pay | Admitting: Cardiology

## 2022-05-03 VITALS — BP 128/72 | HR 75 | Ht 62.0 in | Wt 177.0 lb

## 2022-05-03 DIAGNOSIS — M79661 Pain in right lower leg: Secondary | ICD-10-CM | POA: Diagnosis not present

## 2022-05-03 DIAGNOSIS — I5032 Chronic diastolic (congestive) heart failure: Secondary | ICD-10-CM

## 2022-05-03 DIAGNOSIS — M48061 Spinal stenosis, lumbar region without neurogenic claudication: Secondary | ICD-10-CM | POA: Diagnosis not present

## 2022-05-03 DIAGNOSIS — G4730 Sleep apnea, unspecified: Secondary | ICD-10-CM | POA: Diagnosis not present

## 2022-05-03 DIAGNOSIS — M79606 Pain in leg, unspecified: Secondary | ICD-10-CM

## 2022-05-03 DIAGNOSIS — E785 Hyperlipidemia, unspecified: Secondary | ICD-10-CM | POA: Diagnosis not present

## 2022-05-03 DIAGNOSIS — M545 Low back pain, unspecified: Secondary | ICD-10-CM | POA: Diagnosis not present

## 2022-05-03 NOTE — Patient Instructions (Signed)
Medication Instructions:  Your physician recommends that you continue on your current medications as directed. Please refer to the Current Medication list given to you today.   *If you need a refill on your cardiac medications before your next appointment, please call your pharmacy*   Lab Work: NONE ordered at this time of appointment   If you have labs (blood work) drawn today and your tests are completely normal, you will receive your results only by: MyChart Message (if you have MyChart) OR A paper copy in the mail If you have any lab test that is abnormal or we need to change your treatment, we will call you to review the results.   Testing/Procedures: NONE ordered at this time of appointment     Follow-Up: At [facility], you and your health needs are our priority.  As part of our continuing mission to provide you with exceptional heart care, we have created designated Provider Care Teams.  These Care Teams include your primary Cardiologist (physician) and Advanced Practice Providers (APPs -  Physician Assistants and Nurse Practitioners) who all work together to provide you with the care you need, when you need it.  We recommend signing up for the patient portal called "MyChart".  Sign up information is provided on this After Visit Summary.  MyChart is used to connect with patients for Virtual Visits (Telemedicine).  Patients are able to view lab/test results, encounter notes, upcoming appointments, etc.  Non-urgent messages can be sent to your provider as well.   To learn more about what you can do with MyChart, go to [email or web address].    Your next appointment:   1 year(s)  Provider:   James Hochrein, MD     Other Instructions  

## 2022-05-08 DIAGNOSIS — M48061 Spinal stenosis, lumbar region without neurogenic claudication: Secondary | ICD-10-CM | POA: Diagnosis not present

## 2022-05-08 DIAGNOSIS — M545 Low back pain, unspecified: Secondary | ICD-10-CM | POA: Diagnosis not present

## 2022-05-10 DIAGNOSIS — M48061 Spinal stenosis, lumbar region without neurogenic claudication: Secondary | ICD-10-CM | POA: Diagnosis not present

## 2022-05-10 DIAGNOSIS — M545 Low back pain, unspecified: Secondary | ICD-10-CM | POA: Diagnosis not present

## 2022-05-15 DIAGNOSIS — M48061 Spinal stenosis, lumbar region without neurogenic claudication: Secondary | ICD-10-CM | POA: Diagnosis not present

## 2022-05-15 DIAGNOSIS — M545 Low back pain, unspecified: Secondary | ICD-10-CM | POA: Diagnosis not present

## 2022-05-17 DIAGNOSIS — M545 Low back pain, unspecified: Secondary | ICD-10-CM | POA: Diagnosis not present

## 2022-05-17 DIAGNOSIS — M48061 Spinal stenosis, lumbar region without neurogenic claudication: Secondary | ICD-10-CM | POA: Diagnosis not present

## 2022-05-21 DIAGNOSIS — G4733 Obstructive sleep apnea (adult) (pediatric): Secondary | ICD-10-CM | POA: Diagnosis not present

## 2022-05-22 ENCOUNTER — Ambulatory Visit (INDEPENDENT_AMBULATORY_CARE_PROVIDER_SITE_OTHER): Payer: Medicare PPO | Admitting: Family Medicine

## 2022-05-22 ENCOUNTER — Encounter: Payer: Self-pay | Admitting: Family Medicine

## 2022-05-22 VITALS — BP 142/90 | HR 91 | Temp 97.8°F | Resp 20 | Ht 62.0 in | Wt 174.0 lb

## 2022-05-22 DIAGNOSIS — M25512 Pain in left shoulder: Secondary | ICD-10-CM | POA: Diagnosis not present

## 2022-05-22 DIAGNOSIS — M898X1 Other specified disorders of bone, shoulder: Secondary | ICD-10-CM | POA: Diagnosis not present

## 2022-05-22 DIAGNOSIS — G8929 Other chronic pain: Secondary | ICD-10-CM

## 2022-05-22 DIAGNOSIS — M25511 Pain in right shoulder: Secondary | ICD-10-CM

## 2022-05-22 DIAGNOSIS — M48061 Spinal stenosis, lumbar region without neurogenic claudication: Secondary | ICD-10-CM

## 2022-05-22 MED ORDER — TRAMADOL HCL 50 MG PO TABS: 50.0000 mg | ORAL_TABLET | Freq: Three times a day (TID) | ORAL | 0 refills | Status: DC | PRN

## 2022-05-22 MED ORDER — METHOCARBAMOL 750 MG PO TABS: 750.0000 mg | ORAL_TABLET | Freq: Four times a day (QID) | ORAL | 1 refills | Status: DC

## 2022-05-22 MED ORDER — PREDNISONE 10 MG PO TABS
ORAL_TABLET | ORAL | 0 refills | Status: DC
Start: 2022-05-22 — End: 2022-06-29

## 2022-05-22 NOTE — Progress Notes (Signed)
Subjective:   By signing my name below, I, [name], attest that this documentation has been prepared under the direction and in the presence of Ann Held, DO. 05/22/2022   Patient ID: Sierra Ford, female    DOB: 1940-03-03, 82 y.o.   MRN: [id number]  No chief complaint on file.   HPI Patient is in today for a office visit.   She states that she has shoulder, back pain that radiates down both of her legs to her knees. She does not have both pains at the same time. She also recently seen a shoulder specialist in December. She denies gabapentin being helpful and admits it only makes her sleepy.   Past Surgical History:  Procedure Laterality Date   ABDOMINAL EXPLORATION SURGERY     BUNIONECTOMY     right   COLONOSCOPY  2009   ECTOPIC PREGNANCY SURGERY     FOOT SURGERY     TUBAL LIGATION      Family History  Problem Relation Age of Onset   Heart failure Mother        died from   Colon polyps Mother    Hypertension Mother    Sudden death Mother    Obesity Mother    Alcoholism Father    Diabetes Maternal Aunt        grandaughter   Diabetes Maternal Aunt    Colon cancer Neg Hx    Esophageal cancer Neg Hx    Stomach cancer Neg Hx     Social History   Socioeconomic History   Marital status: Divorced    Spouse name: Not on file   Number of children: Not on file   Years of education: Not on file   Highest education level: Not on file  Occupational History   Occupation: Retired Scientist, research (medical)  Tobacco Use   Smoking status: Former    Packs/day: 1.00    Years: 50.00    Additional pack years: 0.00    Total pack years: 50.00    Types: Cigarettes    Quit date: 11/21/2009    Years since quitting: 12.5   Smokeless tobacco: Never  Vaping Use   Vaping Use: Never used  Substance and Sexual Activity   Alcohol use: Yes    Comment: rare 3 times a year per pt   Drug use: No   Sexual activity: Not Currently    Partners: Male  Other Topics Concern   Not  on file  Social History Narrative   Regular exercise--- no   Social Determinants of Health   Financial Resource Strain: Medium Risk (03/20/2021)   Overall Financial Resource Strain (CARDIA)    Difficulty of Paying Living Expenses: Somewhat hard  Food Insecurity: No Food Insecurity (02/13/2022)   Hunger Vital Sign    Worried About Running Out of Food in the Last Year: Never true    Ran Out of Food in the Last Year: Never true  Transportation Needs: No Transportation Needs (02/13/2022)   PRAPARE - Hydrologist (Medical): No    Lack of Transportation (Non-Medical): No  Physical Activity: Sufficiently Active (08/15/2020)   Exercise Vital Sign    Days of Exercise per Week: 4 days    Minutes of Exercise per Session: 60 min  Stress: No Stress Concern Present (08/15/2020)   [place]    Feeling of Stress : Only a little  Social Connections: Moderately Integrated (08/15/2020)  Social Licensed conveyancer [NHANES]    Frequency of Communication with Friends and Family: More than three times a week    Frequency of Social Gatherings with Friends and Family: More than three times a week    Attends Religious Services: More than 4 times per year    Active Member of [employer] or Organizations: Yes    Attends Archivist Meetings: 1 to 4 times per year    Marital Status: Widowed  Intimate Partner Violence: Not At Risk (08/15/2020)   Humiliation, Afraid, Rape, and Kick questionnaire    Fear of Current or Ex-Partner: No    Emotionally Abused: No    Physically Abused: No    Sexually Abused: No    Outpatient Medications Prior to Visit  Medication Sig Dispense Refill   apixaban (ELIQUIS) 2.5 MG TABS tablet Take 1 tablet (2.5 mg total) by mouth 2 (two) times daily. 60 tablet 5   blood glucose meter kit and supplies KIT Dispense based on patient and insurance preference. Use up to four times  daily as directed. (FOR ICD-9 250.00, 250.01). 1 each 0   Calcium Carb-Cholecalciferol (CALCIUM + D3 PO) Take 1 tablet by mouth daily.     cholecalciferol (VITAMIN D3) 10 MCG (400 UNIT) TABS tablet Take 400 Units by mouth 2 (two) times daily.     Coenzyme Q10 100 MG TABS Take 100 mg by mouth daily.     Evolocumab (REPATHA SURECLICK) [id number] MG/ML SOAJ Inject 140 mg into the skin every 14 (fourteen) days. 2 mL 11   Flaxseed, Linseed, (FLAXSEED OIL PO) Take 1,000 mg by mouth daily.      furosemide (LASIX) 40 MG tablet TAKE 1 TABLET DAILY (Patient taking differently: Take 40 mg by mouth in the morning.) 90 tablet 2   gabapentin (NEURONTIN) 300 MG capsule Take 300 mg by mouth at bedtime.     hydrocortisone (ANUSOL-HC) 25 MG suppository Place 1 suppository (25 mg total) rectally every 12 (twelve) hours. (Patient taking differently: Place 25 mg rectally 2 (two) times daily as needed for hemorrhoids.) 12 suppository 1   Insulin Pen Needle (NOVOFINE PLUS PEN NEEDLE) 32G X 4 MM MISC As directed 100 each 1   Multiple Vitamin (MULTIVITAMIN) capsule Take 1 capsule by mouth daily with breakfast.     Omega-3 Fatty Acids (FISH OIL) 1000 MG CAPS Take 1,000 mg by mouth daily.     potassium chloride SA (KLOR-CON M) 20 MEQ tablet Take 1 tablet (20 mEq total) by mouth daily. 90 tablet 3   rosuvastatin (CRESTOR) 10 MG tablet Take 1 tablet (10 mg total) by mouth daily. 90 tablet 2   Semaglutide, 1 MG/DOSE, 4 MG/3ML SOPN Inject 1 mg as directed once a week. (Patient taking differently: Inject 1 mg as directed every Tuesday.) 3 mL 3   TRUE METRIX BLOOD GLUCOSE TEST test strip TEST  UP  TO FOUR TIMES DAILY AS DIRECTED 200 each 12   TRUEPLUS LANCETS 33G MISC TEST  UP TO FOUR TIMES DAILY AS DIRECTED 200 each 12   No facility-administered medications prior to visit.    No Known Allergies  Review of Systems  Musculoskeletal:        (+)shoulder pain (+)back pain (+)bilateral leg pain       Objective:    Physical  Exam Constitutional:      General: She is not in acute distress.    Appearance: Normal appearance. She is not ill-appearing.  HENT:     Head: Normocephalic  and atraumatic.     Right Ear: External ear normal.     Left Ear: External ear normal.  Eyes:     Extraocular Movements: Extraocular movements intact.     Pupils: Pupils are equal, round, and reactive to light.  Cardiovascular:     Rate and Rhythm: Normal rate and regular rhythm.     Heart sounds: Normal heart sounds. No murmur heard.    No gallop.  Pulmonary:     Effort: Pulmonary effort is normal. No respiratory distress.     Breath sounds: Normal breath sounds. No wheezing or rales.  Musculoskeletal:     Comments: She has 2/5 strength with leg extension. 3/5 with left hip flexor with knee.  Skin:    General: Skin is warm and dry.  Neurological:     Mental Status: She is alert and oriented to person, place, and time.  Psychiatric:        Judgment: Judgment normal.     There were no vitals taken for this visit. Wt Readings from Last 3 Encounters:  05/03/22 177 lb (80.3 kg)  03/09/22 176 lb 6.4 oz (80 kg)  03/07/22 173 lb (78.5 kg)       Assessment & Plan:  There are no diagnoses linked to this encounter.  I, Shehryar [place], personally preformed the services described in this documentation.  All medical record entries made by the scribe were at my direction and in my presence.  I have reviewed the chart and discharge instructions (if applicable) and agree that the record reflects my personal performance and is accurate and complete. 05/22/2022   I,[name],acting as a scribe for Ann Held, DO.,have documented all relevant documentation on the behalf of Ann Held, DO,as directed by  Ann Held, DO while in the presence of Ann Held, DO.   Shehryar [employer]

## 2022-05-24 DIAGNOSIS — G8929 Other chronic pain: Secondary | ICD-10-CM | POA: Insufficient documentation

## 2022-05-24 NOTE — Assessment & Plan Note (Signed)
Reviewed mri Refer to neurosurgery

## 2022-05-29 ENCOUNTER — Other Ambulatory Visit: Payer: Self-pay | Admitting: Family Medicine

## 2022-05-29 DIAGNOSIS — M48061 Spinal stenosis, lumbar region without neurogenic claudication: Secondary | ICD-10-CM

## 2022-05-29 DIAGNOSIS — G8929 Other chronic pain: Secondary | ICD-10-CM

## 2022-05-29 NOTE — Telephone Encounter (Signed)
Requesting: tramadol 50mg  Contract: No UDS: no Last Visit: 05/22/2022 Next Visit: Visit date not found Last Refill: 05/22/22  Please Advise

## 2022-05-30 ENCOUNTER — Telehealth: Payer: Self-pay | Admitting: *Deleted

## 2022-05-30 NOTE — Telephone Encounter (Signed)
  After reviewing your prescription benefit plan's established and approved criteria for this drug with your doctor, the request was approved for the following time period: 05/30/2022 - 11/26/2022*

## 2022-05-30 NOTE — Telephone Encounter (Signed)
Prior auth started via cover my meds.  Awaiting determination.  Key: [id number]

## 2022-06-07 ENCOUNTER — Encounter: Payer: Self-pay | Admitting: *Deleted

## 2022-06-08 ENCOUNTER — Telehealth: Payer: Self-pay | Admitting: *Deleted

## 2022-06-08 DIAGNOSIS — M48062 Spinal stenosis, lumbar region with neurogenic claudication: Secondary | ICD-10-CM | POA: Diagnosis not present

## 2022-06-08 DIAGNOSIS — Z6832 Body mass index (BMI) 32.0-32.9, adult: Secondary | ICD-10-CM | POA: Diagnosis not present

## 2022-06-08 DIAGNOSIS — M4316 Spondylolisthesis, lumbar region: Secondary | ICD-10-CM | POA: Diagnosis not present

## 2022-06-08 NOTE — Telephone Encounter (Signed)
   Pre-operative Risk Assessment    Patient Name: Sierra Ford  DOB: November 29, 1940 MRN: [id number]      Request for Surgical Clearance    Procedure:   L3-L4 ESI  Date of Surgery:  Clearance TBD                                 Surgeon:  Dr. Aileen Fass Surgeon's Group or Practice Name:  [facility] Phone number:  [phone] Fax number:  [phone]   Type of Clearance Requested:   - Medical  - Pharmacy:  Hold Apixaban (Eliquis) 3 days prior.    Type of Anesthesia:  Not Indicated   Additional requests/questions:    Signed, Emmit Pomfret   06/08/2022, 11:32 AM

## 2022-06-08 NOTE — Telephone Encounter (Signed)
Dr. Antoine Poche, patient recently seen by you on 05/03/2022.  We have received request for clearance for upcoming ESI. Would you please comment on cardiac risk for upcoming procedure?  Please route your response to p cv div preop.  Thank you, Marcelino Duster

## 2022-06-11 NOTE — Telephone Encounter (Signed)
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    Procedure: L3-L4 ESI  Date of procedure: TBD    CrCl 83 mL/min Platelet count 332   Per office protocol, patient can hold Eliquis for 3 days prior to procedure.     **This guidance is not considered finalized until pre-operative APP has relayed final recommendations.**

## 2022-06-11 NOTE — Telephone Encounter (Signed)
   Primary Cardiologist: Rollene Rotunda, MD  Chart reviewed as part of pre-operative protocol coverage. Given past medical history and time since last visit, based on ACC/AHA guidelines, Sierra Ford would be at acceptable risk for the planned procedure without further cardiovascular testing.   Patient was advised that if she develops new symptoms prior to surgery to contact our office to arrange a follow-up appointment. She verbalized understanding.  Per office protocol, patient can hold Eliquis for 3 days prior to procedure.   I will route this recommendation to the requesting party via Epic fax function and remove from pre-op pool.  Please call with questions.  Levi Aland, NP-C  06/11/2022, 8:13 AM [address] Office [phone] Fax [phone]

## 2022-06-12 ENCOUNTER — Other Ambulatory Visit: Payer: Self-pay | Admitting: Family Medicine

## 2022-06-12 DIAGNOSIS — G8929 Other chronic pain: Secondary | ICD-10-CM

## 2022-06-12 DIAGNOSIS — M48061 Spinal stenosis, lumbar region without neurogenic claudication: Secondary | ICD-10-CM

## 2022-06-12 NOTE — Telephone Encounter (Signed)
Requesting: tramadol Contract: No UDS: no Last Visit: 05/22/2022 Next Visit: Visit date not found Last Refill: 05/30/22  Please Advise

## 2022-06-14 DIAGNOSIS — E119 Type 2 diabetes mellitus without complications: Secondary | ICD-10-CM | POA: Diagnosis not present

## 2022-06-27 DIAGNOSIS — M48062 Spinal stenosis, lumbar region with neurogenic claudication: Secondary | ICD-10-CM | POA: Diagnosis not present

## 2022-06-27 DIAGNOSIS — M4316 Spondylolisthesis, lumbar region: Secondary | ICD-10-CM | POA: Diagnosis not present

## 2022-06-28 ENCOUNTER — Telehealth: Payer: Self-pay | Admitting: *Deleted

## 2022-06-28 ENCOUNTER — Telehealth: Payer: Self-pay | Admitting: Pharmacist

## 2022-06-28 NOTE — Telephone Encounter (Signed)
Patient called back. She was not able to apply early for [employer] fund. She plans to apply as soon as able in June 2024.  She endorses she has 1 pen of Ozempic left which should last until she applies for [employer] around 07/29/2022

## 2022-06-28 NOTE — Telephone Encounter (Signed)
   Pre-operative Risk Assessment    Patient Name: Sierra Ford  DOB: 01/24/1941 MRN: [id number]      Request for Surgical Clearance    Procedure:   Lumbar Fusion  Date of Surgery:  Clearance TBD                                 Surgeon:  Dr. Andres Labrum Surgeon's Group or Practice Name:  [facility] Phone number:  [phone] Fax number:  [phone]   Type of Clearance Requested:   - Medical  - Pharmacy:  Hold Apixaban (Eliquis) Not Indicated   Type of Anesthesia:  General    Additional requests/questions:    Signed, Emmit Pomfret   06/28/2022, 2:33 PM

## 2022-06-28 NOTE — Telephone Encounter (Signed)
Patient was enrolled in [employer] Type 2 DM fund in but she has used up $1000. The Type 2 DM fund has just opened up. I tried to enroll her again on line but it states she will have to wait until 07/29/2022.  I called patient and provided number to [employer] Phone: [phone]. She might be able to ask them if she could have an expemption to waiting until 07/29/2022.

## 2022-06-29 ENCOUNTER — Telehealth: Payer: Self-pay | Admitting: *Deleted

## 2022-06-29 NOTE — Telephone Encounter (Signed)
Left message on machine for pt to contact the office to schedule tele clearance visit.

## 2022-06-29 NOTE — Telephone Encounter (Signed)
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    Procedure: lumbar fusion Date of procedure: TBD  Patient with a single provoked PE/DVT in 2006.  CrCl 65 ml/min  Per office protocol, patient can hold Eliquis for 3 days prior to procedure.     **This guidance is not considered finalized until pre-operative APP has relayed final recommendations.**

## 2022-06-29 NOTE — Telephone Encounter (Signed)
S/w pt schedule tele phone clearance appt.     Patient Consent for Virtual Visit         Sierra Ford has provided verbal consent on 06/29/2022 for a virtual visit (video or telephone).   CONSENT FOR VIRTUAL VISIT FOR:  Sierra Ford  By participating in this virtual visit I agree to the following:  I hereby voluntarily request, consent and authorize [facility] and its employed or contracted physicians, physician assistants, nurse practitioners or other licensed health care professionals (the Practitioner), to provide me with telemedicine health care services (the "Services") as deemed necessary by the treating Practitioner. I acknowledge and consent to receive the Services by the Practitioner via telemedicine. I understand that the telemedicine visit will involve communicating with the Practitioner through live audiovisual communication technology and the disclosure of certain medical information by electronic transmission. I acknowledge that I have been given the opportunity to request an in-person assessment or other available alternative prior to the telemedicine visit and am voluntarily participating in the telemedicine visit.  I understand that I have the right to withhold or withdraw my consent to the use of telemedicine in the course of my care at any time, without affecting my right to future care or treatment, and that the Practitioner or I may terminate the telemedicine visit at any time. I understand that I have the right to inspect all information obtained and/or recorded in the course of the telemedicine visit and may receive copies of available information for a reasonable fee.  I understand that some of the potential risks of receiving the Services via telemedicine include:  Delay or interruption in medical evaluation due to technological equipment failure or disruption; Information transmitted may not be sufficient (e.g. poor resolution of images) to allow for  appropriate medical decision making by the Practitioner; and/or  In rare instances, security protocols could fail, causing a breach of personal health information.  Furthermore, I acknowledge that it is my responsibility to provide information about my medical history, conditions and care that is complete and accurate to the best of my ability. I acknowledge that Practitioner's advice, recommendations, and/or decision may be based on factors not within their control, such as incomplete or inaccurate data provided by me or distortions of diagnostic images or specimens that may result from electronic transmissions. I understand that the practice of medicine is not an exact science and that Practitioner makes no warranties or guarantees regarding treatment outcomes. I acknowledge that a copy of this consent can be made available to me via my patient portal [facility] MyChart), or I can request a printed copy by calling the office of [facility].    I understand that my insurance will be billed for this visit.   I have read or had this consent read to me. I understand the contents of this consent, which adequately explains the benefits and risks of the Services being provided via telemedicine.  I have been provided ample opportunity to ask questions regarding this consent and the Services and have had my questions answered to my satisfaction. I give my informed consent for the services to be provided through the use of telemedicine in my medical care

## 2022-06-29 NOTE — Telephone Encounter (Signed)
   Name: Sierra Ford  DOB: 1940-04-01  MRN: [id number]  Primary Cardiologist: Rollene Rotunda, MD  Chart reviewed as part of pre-operative protocol coverage. Because of Sierra Ford's past medical history and time since last visit, she will require a follow-up telephone visit in order to better assess preoperative cardiovascular risk.  Pre-op covering staff: - Please schedule appointment and call patient to inform them. If patient already had an upcoming appointment within acceptable timeframe, please add "pre-op clearance" to the appointment notes so provider is aware. - Please contact requesting surgeon's office via preferred method (i.e, phone, fax) to inform them of need for appointment prior to surgery.  Per office protocol, patient can hold Eliquis for 3 days prior to procedure.      Sharlene Dory, PA-C  06/29/2022, 8:02 AM

## 2022-07-04 NOTE — Progress Notes (Unsigned)
Virtual Visit via Telephone Note   Because of Sierra Ford's co-morbid illnesses, she is at least at moderate risk for complications without adequate follow up.  This format is felt to be most appropriate for this patient at this time.  The patient did not have access to video technology/had technical difficulties with video requiring transitioning to audio format only (telephone).  All issues noted in this document were discussed and addressed.  No physical exam could be performed with this format.  Please refer to the patient's chart for her consent to telehealth for [facility].  Evaluation Performed:  Preoperative cardiovascular risk assessment _____________   Date:  07/04/2022   Patient ID:  Sierra Ford, DOB 05/02/1940, MRN [id number] Patient Location:  Home Provider location:   Office  Primary Care Provider:  Zola Button, Grayling Congress, DO Primary Cardiologist:  Rollene Rotunda, MD  Chief Complaint / Patient Profile   82 y.o. y/o female with a h/o PE/DVT in 2006 on chronic anticoagulation, diastolic dysfunction, OSA on CPAP, HLD, who is pending lumbar fusion and presents today for telephonic preoperative cardiovascular risk assessment.  History of Present Illness    Sierra Ford is a 82 y.o. female who presents via audio/video conferencing for a telehealth visit today.  Pt was last seen in cardiology clinic on 05/03/22 by Dr. Antoine Poche.  At that time SCOTLYN GOTSHALL was doing well.  The patient is now pending procedure as outlined above. Since her last visit, she denies chest pain, shortness of breath, lower extremity edema, fatigue, palpitations, melena, hematuria, hemoptysis, diaphoresis, weakness, presyncope, syncope, orthopnea, and PND. She has been limited by back pain since October/November but continues to be active around her home and is able to achieve > 4 METS activity without concerning cardiac symptoms.    Past Medical History    Past Medical History:   Diagnosis Date   Anxiety    Back pain    Chest pain    CLite with apical ischemia in 2006 - normal coronary arteries by [facility] in 12/2004;  Myoview 11/12:  Low risk stress nuclear study with a small, partially reversible apical defect most likely related to apical thinning; cannot R/O very mild apical ischemia.  EF: 75%    Decreased hearing    Depression    Diabetes (HCC)    DVT (deep venous thrombosis) (HCC) 2006   hx of   Ectopic pregnancy with intrauterine pregnancy    Hiatal hernia    Hyperlipidemia    PE (pulmonary embolism) 2006   hx of   Sleep apnea    CPAP    Past Surgical History:  Procedure Laterality Date   ABDOMINAL EXPLORATION SURGERY     BUNIONECTOMY     right   COLONOSCOPY  2009   ECTOPIC PREGNANCY SURGERY     FOOT SURGERY     TUBAL LIGATION      Allergies  No Known Allergies  Home Medications    Prior to Admission medications   Medication Sig Start Date End Date Taking? Authorizing Provider  apixaban (ELIQUIS) 2.5 MG TABS tablet Take 1 tablet (2.5 mg total) by mouth 2 (two) times daily. 07/18/21   Rollene Rotunda, MD  blood glucose meter kit and supplies KIT Dispense based on patient and insurance preference. Use up to four times daily as directed. (FOR ICD-9 250.00, 250.01). 04/08/17   Zola Button, Grayling Congress, DO  Calcium Carb-Cholecalciferol (CALCIUM + D3 PO) Take 1 tablet by mouth daily.  [provider]  cholecalciferol (VITAMIN D3) 10 MCG (400 UNIT) TABS tablet Take 400 Units by mouth 2 (two) times daily.    [provider]  Coenzyme Q10 100 MG TABS Take 100 mg by mouth daily.    [provider]  Evolocumab (REPATHA SURECLICK) 140 MG/ML SOAJ Inject 140 mg into the skin every 14 (fourteen) days. 11/28/21   Rollene Rotunda, MD  Flaxseed, Linseed, (FLAXSEED OIL PO) Take 1,000 mg by mouth daily.     [provider]  furosemide (LASIX) 40 MG tablet TAKE 1 TABLET DAILY Patient taking differently: Take 40 mg by mouth in the  morning. 01/30/21   Rollene Rotunda, MD  gabapentin (NEURONTIN) 300 MG capsule Take 300 mg by mouth at bedtime.    [provider]  Insulin Pen Needle (NOVOFINE PLUS PEN NEEDLE) 32G X 4 MM MISC As directed 05/10/20   Zola Button, Grayling Congress, DO  methocarbamol (ROBAXIN-750) 750 MG tablet Take 1 tablet (750 mg total) by mouth 4 (four) times daily. 05/22/22   Donato Schultz, DO  Misc Natural Products (NEURIVA PO) Take 1 capsule by mouth daily.    [provider]  Multiple Vitamin (MULTIVITAMIN) capsule Take 1 capsule by mouth daily with breakfast.    [provider]  Omega-3 Fatty Acids (FISH OIL) 1000 MG CAPS Take 1,000 mg by mouth daily.    [provider]  potassium chloride SA (KLOR-CON M) 20 MEQ tablet Take 1 tablet (20 mEq total) by mouth daily. 10/26/21   Sharlene Dory, NP  rosuvastatin (CRESTOR) 10 MG tablet Take 1 tablet (10 mg total) by mouth daily. 09/04/21   Rollene Rotunda, MD  Semaglutide, 1 MG/DOSE, 4 MG/3ML SOPN Inject 1 mg as directed once a week. Patient taking differently: Inject 1 mg as directed every Tuesday. 02/06/22   Donato Schultz, DO  traMADol (ULTRAM) 50 MG tablet TAKE 1 TABLET BY MOUTH EVERY 8 HOURS AS NEEDED FOR UP TO 5 DAYS 06/12/22   Zola Button, Myrene Buddy R, DO  TRUE METRIX BLOOD GLUCOSE TEST test strip TEST  UP  TO FOUR TIMES DAILY AS DIRECTED 12/05/17   Zola Button, Grayling Congress, DO  TRUEPLUS LANCETS 33G MISC TEST  UP TO FOUR TIMES DAILY AS DIRECTED 12/05/17   Donato Schultz, DO  omega-3 acid ethyl esters (LOVAZA) 1 G capsule Take 1 g by mouth 3 (three) times daily.    05/07/11  [provider]  pravastatin (PRAVACHOL) 20 MG tablet Take 20 mg by mouth daily.    02/04/19  [provider]    Physical Exam    Vital Signs:  ARICELA TORREALBA does not have vital signs available for review today.  Given telephonic nature of communication, physical exam is limited. AAOx3. NAD. Normal affect.  Speech and  respirations are unlabored.  Accessory Clinical Findings    None  Assessment & Plan    1.  Preoperative Cardiovascular Risk Assessment: According to the Revised Cardiac Risk Index (RCRI), her Perioperative Risk of Major Cardiac Event is (%): 0.9. Her Functional Capacity in METs is: 6.05 according to the Duke Activity Status Index (DASI). The patient is doing well from a cardiac perspective. Therefore, based on ACC/AHA guidelines, the patient would be at acceptable risk for the planned procedure without further cardiovascular testing.   The patient was advised that if she develops new symptoms prior to surgery to contact our office to arrange for a follow-up visit, and she verbalized understanding.  Per office protocol, patient can hold Eliquis for 3 days prior to procedure.   A copy of this note will be routed to requesting surgeon.  Time:   Today, I have spent 8 minutes with the patient with telehealth technology discussing medical history, symptoms, and management plan.    Levi Aland, NP-C  07/05/2022, 10:00 AM [address] Office [phone] Fax [phone]

## 2022-07-05 ENCOUNTER — Encounter: Payer: Self-pay | Admitting: Nurse Practitioner

## 2022-07-05 ENCOUNTER — Ambulatory Visit: Payer: Medicare PPO | Attending: Interventional Cardiology | Admitting: Nurse Practitioner

## 2022-07-05 DIAGNOSIS — Z01810 Encounter for preprocedural cardiovascular examination: Secondary | ICD-10-CM | POA: Diagnosis not present

## 2022-07-09 ENCOUNTER — Other Ambulatory Visit: Payer: Self-pay | Admitting: Neurosurgery

## 2022-07-13 NOTE — Progress Notes (Signed)
Surgical Instructions    Your procedure is scheduled on Tuesday July 24, 2022.  Report to [facility] Main Entrance "A" at 7:30 A.M., then check in with the Admitting office.  Call this number if you have problems the morning of surgery:  [phone]   If you have any questions prior to your surgery date call [phone]: Open Monday-Friday 8am-4pm If you experience any cold or flu symptoms such as cough, fever, chills, shortness of breath, etc. between now and your scheduled surgery, please notify us at the above number     Remember:  Do not eat or drink after midnight the night before your surgery   Take these medicines the morning of surgery with A SIP OF WATER:  rosuvastatin (CRESTOR)   If needed:  methocarbamol (ROBAXIN-750)  traMADol (ULTRAM)   Follow your surgeon's instructions on when to stop Eliquis.  If no instructions were given by your surgeon then you will need to call the office to get those instructions.    Stop taking Semaglutide ( Ozempic) 7 days prior to surgery. Last dose on_____  As of today, STOP taking any Aspirin (unless otherwise instructed by your surgeon) Aleve, Naproxen, Ibuprofen, Motrin, Advil, Goody's, BC's, all herbal medications, fish oil, and all vitamins.  WHAT DO I DO ABOUT MY DIABETES MEDICATION?   Do not take oral diabetes medicines (pills) the morning of surgery.  The day of surgery, do not take other diabetes injectables, including Byetta (exenatide), Bydureon (exenatide ER), Victoza (liraglutide), or Trulicity (dulaglutide).  HOW TO MANAGE YOUR DIABETES BEFORE AND AFTER SURGERY  Why is it important to control my blood sugar before and after surgery? Improving blood sugar levels before and after surgery helps healing and can limit problems. A way of improving blood sugar control is eating a healthy diet by:  Eating less sugar and carbohydrates  Increasing activity/exercise  Talking with your doctor about reaching your blood sugar  goals High blood sugars (greater than 180 mg/dL) can raise your risk of infections and slow your recovery, so you will need to focus on controlling your diabetes during the weeks before surgery. Make sure that the doctor who takes care of your diabetes knows about your planned surgery including the date and location.  How do I manage my blood sugar before surgery? Check your blood sugar at least 4 times a day, starting 2 days before surgery, to make sure that the level is not too high or low.  Check your blood sugar the morning of your surgery when you wake up and every 2 hours until you get to the Short Stay unit.  If your blood sugar is less than 70 mg/dL, you will need to treat for low blood sugar: Do not take insulin. Treat a low blood sugar (less than 70 mg/dL) with  cup of clear juice (cranberry or apple), 4 glucose tablets, OR glucose gel. Recheck blood sugar in 15 minutes after treatment (to make sure it is greater than 70 mg/dL). If your blood sugar is not greater than 70 mg/dL on recheck, call [phone] for further instructions. Report your blood sugar to the short stay nurse when you get to Short Stay.  If you are admitted to the hospital after surgery: Your blood sugar will be checked by the staff and you will probably be given insulin after surgery (instead of oral diabetes medicines) to make sure you have good blood sugar levels. The goal for blood sugar control after surgery is 80-180 mg/dL.   Special instructions:  Oral Hygiene is also important to reduce your risk of infection.  Remember - BRUSH YOUR TEETH THE MORNING OF SURGERY WITH YOUR REGULAR TOOTHPASTE    Pre-operative 5 CHG Bath Instructions   You can play a key role in reducing the risk of infection after surgery. Your skin needs to be as free of germs as possible. You can reduce the number of germs on your skin by washing with CHG (chlorhexidine gluconate) soap before surgery. CHG is an antiseptic soap that  kills germs and continues to kill germs even after washing.   DO NOT use if you have an allergy to chlorhexidine/CHG or antibacterial soaps. If your skin becomes reddened or irritated, stop using the CHG and notify one of our RNs at [phone].   Please shower with the CHG soap starting 4 days before surgery using the following schedule:     Please keep in mind the following:  DO NOT shave, including legs and underarms, starting the day of your first shower.   You may shave your face at any point before/day of surgery.  Place clean sheets on your bed the day you start using CHG soap. Use a clean washcloth (not used since being washed) for each shower. DO NOT sleep with pets once you start using the CHG.   CHG Shower Instructions:  If you choose to wash your hair and private area, wash first with your normal shampoo/soap.  After you use shampoo/soap, rinse your hair and body thoroughly to remove shampoo/soap residue.  Turn the water OFF and apply about 3 tablespoons (45 ml) of CHG soap to a CLEAN washcloth.  Apply CHG soap ONLY FROM YOUR NECK DOWN TO YOUR TOES (washing for 3-5 minutes)  DO NOT use CHG soap on face, private areas, open wounds, or sores.  Pay special attention to the area where your surgery is being performed.  If you are having back surgery, having someone wash your back for you may be helpful. Wait 2 minutes after CHG soap is applied, then you may rinse off the CHG soap.  Pat dry with a clean towel  Put on clean clothes/pajamas   If you choose to wear lotion, please use ONLY the CHG-compatible lotions on the back of this paper.     Additional instructions for the day of surgery: DO NOT APPLY any lotions, deodorants, cologne, or perfumes.   Put on clean/comfortable clothes.  Brush your teeth.  Ask your nurse before applying any prescription medications to the skin.      CHG Compatible Lotions   Aveeno Moisturizing lotion  Cetaphil Moisturizing Cream   Cetaphil Moisturizing Lotion  Clairol Herbal Essence Moisturizing Lotion, Dry Skin  Clairol Herbal Essence Moisturizing Lotion, Extra Dry Skin  Clairol Herbal Essence Moisturizing Lotion, Normal Skin  Curel Age Defying Therapeutic Moisturizing Lotion with Alpha Hydroxy  Curel Extreme Care Body Lotion  Curel Soothing Hands Moisturizing Hand Lotion  Curel Therapeutic Moisturizing Cream, Fragrance-Free  Curel Therapeutic Moisturizing Lotion, Fragrance-Free  Curel Therapeutic Moisturizing Lotion, Original Formula  Eucerin Daily Replenishing Lotion  Eucerin Dry Skin Therapy Plus Alpha Hydroxy Crme  Eucerin Dry Skin Therapy Plus Alpha Hydroxy Lotion  Eucerin Original Crme  Eucerin Original Lotion  Eucerin Plus Crme Eucerin Plus Lotion  Eucerin TriLipid Replenishing Lotion  Keri Anti-Bacterial Hand Lotion  Keri Deep Conditioning Original Lotion Dry Skin Formula Softly Scented  Keri Deep Conditioning Original Lotion, Fragrance Free Sensitive Skin Formula  Keri Lotion Fast Absorbing Fragrance Free Sensitive Skin Formula  Keri Lotion  Fast Absorbing Softly Scented Dry Skin Formula  Keri Original Lotion  Keri Skin Renewal Lotion Keri Silky Smooth Lotion  Keri Silky Smooth Sensitive Skin Lotion  Nivea Body Creamy Conditioning Oil  Nivea Body Extra Enriched Teacher, adult education Moisturizing Lotion Nivea Crme  Nivea Skin Firming Lotion  NutraDerm 30 Skin Lotion  NutraDerm Skin Lotion  NutraDerm Therapeutic Skin Cream  NutraDerm Therapeutic Skin Lotion  ProShield Protective Hand Cream  Provon moisturizing lotion  Do not wear jewelry or makeup. Do not wear lotions, powders, perfumes/cologne or deodorant. Do not shave 48 hours prior to surgery.  Men may shave face and neck. Do not bring valuables to the hospital. Do not wear nail polish, gel polish, artificial nails, or any other type of covering on natural nails (fingers and toes) If you have artificial  nails or gel coating that need to be removed by a nail salon, please have this removed prior to surgery. Artificial nails or gel coating may interfere with anesthesia's ability to adequately monitor your vital signs.  [facility] is not responsible for any belongings or valuables.    Do NOT Smoke (Tobacco/Vaping)  24 hours prior to your procedure  If you use a CPAP at night, you may bring your mask for your overnight stay.   Contacts, glasses, hearing aids, dentures or partials may not be worn into surgery, please bring cases for these belongings   For patients admitted to the hospital, discharge time will be determined by your treatment team.   Patients discharged the day of surgery will not be allowed to drive home, and someone needs to stay with them for 24 hours.   SURGICAL WAITING ROOM VISITATION Patients having surgery or a procedure may have no more than 2 support people in the waiting area - these visitors may rotate.   Children under the age of 57 must have an adult with them who is not the patient. If the patient needs to stay at the hospital during part of their recovery, the visitor guidelines for inpatient rooms apply. Pre-op nurse will coordinate an appropriate time for 1 support person to accompany patient in pre-op.  This support person may not rotate.   Please refer to [email or web address] for the visitor guidelines for Inpatients (after your surgery is over and you are in a regular room).   If you received a COVID test during your pre-op visit, it is requested that you wear a mask when out in public, stay away from anyone that may not be feeling well, and notify your surgeon if you develop symptoms. If you have been in contact with anyone that has tested positive in the last 10 days, please notify your surgeon.    Please read over the following fact sheets that you were given.

## 2022-07-17 ENCOUNTER — Encounter (HOSPITAL_COMMUNITY): Payer: Self-pay

## 2022-07-17 ENCOUNTER — Other Ambulatory Visit: Payer: Self-pay

## 2022-07-17 ENCOUNTER — Encounter (HOSPITAL_COMMUNITY)
Admission: RE | Admit: 2022-07-17 | Discharge: 2022-07-17 | Disposition: A | Discharge status: Home / Self Care | Payer: Medicare PPO | Source: Ambulatory Visit | Attending: Neurosurgery | Admitting: Neurosurgery

## 2022-07-17 VITALS — BP 132/54 | HR 80 | Temp 98.3°F | Resp 18 | Ht 62.0 in | Wt 168.5 lb

## 2022-07-17 DIAGNOSIS — E1165 Type 2 diabetes mellitus with hyperglycemia: Secondary | ICD-10-CM | POA: Diagnosis not present

## 2022-07-17 DIAGNOSIS — F32A Depression, unspecified: Secondary | ICD-10-CM | POA: Insufficient documentation

## 2022-07-17 DIAGNOSIS — Z87891 Personal history of nicotine dependence: Secondary | ICD-10-CM | POA: Diagnosis not present

## 2022-07-17 DIAGNOSIS — Z01812 Encounter for preprocedural laboratory examination: Secondary | ICD-10-CM | POA: Insufficient documentation

## 2022-07-17 DIAGNOSIS — F419 Anxiety disorder, unspecified: Secondary | ICD-10-CM | POA: Insufficient documentation

## 2022-07-17 DIAGNOSIS — M4316 Spondylolisthesis, lumbar region: Secondary | ICD-10-CM | POA: Insufficient documentation

## 2022-07-17 DIAGNOSIS — Z86718 Personal history of other venous thrombosis and embolism: Secondary | ICD-10-CM | POA: Diagnosis not present

## 2022-07-17 DIAGNOSIS — G4733 Obstructive sleep apnea (adult) (pediatric): Secondary | ICD-10-CM | POA: Diagnosis not present

## 2022-07-17 DIAGNOSIS — I10 Essential (primary) hypertension: Secondary | ICD-10-CM | POA: Diagnosis not present

## 2022-07-17 DIAGNOSIS — Z01818 Encounter for other preprocedural examination: Secondary | ICD-10-CM

## 2022-07-17 HISTORY — DX: Other complications of anesthesia, initial encounter: T88.59XA

## 2022-07-17 LAB — CBC
HCT: 44.1 % (ref 36.0–46.0)
Hemoglobin: 14.3 g/dL (ref 12.0–15.0)
MCH: 31 pg (ref 26.0–34.0)
MCHC: 32.4 g/dL (ref 30.0–36.0)
MCV: 95.7 fL (ref 80.0–100.0)
Platelets: 258 10*3/uL (ref 150–400)
RBC: 4.61 MIL/uL (ref 3.87–5.11)
RDW: 13.2 % (ref 11.5–15.5)
WBC: 4.6 10*3/uL (ref 4.0–10.5)
nRBC: 0 % (ref 0.0–0.2)

## 2022-07-17 LAB — BASIC METABOLIC PANEL
Anion gap: 9 (ref 5–15)
BUN: 5 mg/dL — ABNORMAL LOW (ref 8–23)
CO2: 25 mmol/L (ref 22–32)
Calcium: 10.1 mg/dL (ref 8.9–10.3)
Chloride: 105 mmol/L (ref 98–111)
Creatinine, Ser: 0.76 mg/dL (ref 0.44–1.00)
GFR, Estimated: >60 mL/min (ref >60)
Glucose, Bld: 108 mg/dL — ABNORMAL HIGH (ref 70–99)
Potassium: 3.9 mmol/L (ref 3.5–5.1)
Sodium: 139 mmol/L (ref 135–145)

## 2022-07-17 LAB — TYPE AND SCREEN
ABO/RH(D): A POS
Antibody Screen: NEGATIVE

## 2022-07-17 LAB — GLUCOSE, CAPILLARY: Glucose-Capillary: 131 mg/dL — ABNORMAL HIGH (ref 70–99)

## 2022-07-17 LAB — SURGICAL PCR SCREEN
MRSA, PCR: NEGATIVE
Staphylococcus aureus: NEGATIVE

## 2022-07-17 NOTE — Progress Notes (Signed)
PCP - Loreen Freud Chase Cardiologist - Rollene Rotunda  PPM/ICD - Denies  Chest x-ray - N/A EKG - 05/03/22 Stress Test - 06/19/13 ECHO - 11/28/21 Cardiac Cath - 12/29/04  Sleep Study - Yes has OSA CPAP - Nightly  DM - Type II CBG at PAT appt 131 Fasting Blood Sugar - 120-130 average Checks Blood Sugar weekly  Last dose of GLP1 agonist-  Ozempic last dose 21st GLP1 instructions: Instructed to hold 7 days prior  Blood Thinner Instructions:Eliquis, per patient instructed to hold starting on the 1st of June Aspirin Instructions:N/A  ERAS Protcol -N/A  COVID TEST- N/A   Anesthesia review: yes cardiac history  Patient denies shortness of breath, fever, cough and chest pain at PAT appointment   All instructions explained to the patient, with a verbal understanding of the material. Patient agrees to go over the instructions while at home for a better understanding. The opportunity to ask questions was provided.

## 2022-07-18 LAB — HEMOGLOBIN A1C
Hgb A1c MFr Bld: 6.3 % — ABNORMAL HIGH (ref 4.8–5.6)
Mean Plasma Glucose: 134 mg/dL

## 2022-07-18 NOTE — Anesthesia Preprocedure Evaluation (Addendum)
Anesthesia Evaluation  Patient identified by MRN, date of birth, ID band Patient awake    Reviewed: Allergy & Precautions, H&P , NPO status , Patient's Chart, lab work & pertinent test results  Airway Mallampati: II  TM Distance: >3 FB Neck ROM: Full    Dental  (+) Poor Dentition, Missing   Pulmonary sleep apnea , former smoker   Pulmonary exam normal breath sounds clear to auscultation       Cardiovascular hypertension, Pt. on medications + DOE  Normal cardiovascular exam Rhythm:Regular Rate:Normal     Neuro/Psych   Anxiety Depression    negative neurological ROS  negative psych ROS   GI/Hepatic negative GI ROS, Neg liver ROS,,,  Endo/Other  diabetes, Type 2    Renal/GU negative Renal ROS  negative genitourinary   Musculoskeletal  (+) Arthritis , Osteoarthritis,    Abdominal  (+) + obese  Peds negative pediatric ROS (+)  Hematology negative hematology ROS (+)   Anesthesia Other Findings   Reproductive/Obstetrics negative OB ROS                             Anesthesia Physical Anesthesia Plan  ASA: 3  Anesthesia Plan: General   Post-op Pain Management: Dilaudid IV   Induction: Intravenous  PONV Risk Score and Plan: 3 and Ondansetron, Dexamethasone, Midazolam and Treatment may vary due to age or medical condition  Airway Management Planned: Oral ETT  Additional Equipment:   Intra-op Plan:   Post-operative Plan: Extubation in OR  Informed Consent: I have reviewed the patients History and Physical, chart, labs and discussed the procedure including the risks, benefits and alternatives for the proposed anesthesia with the patient or authorized representative who has indicated his/her understanding and acceptance.     Dental advisory given  Plan Discussed with: CRNA  Anesthesia Plan Comments: (See PAT note from 5/28 by Sherlie Ban PA-C )        Anesthesia Quick  Evaluation

## 2022-07-18 NOTE — Progress Notes (Addendum)
Case: [id number] Date/Time: 07/24/22 0915   Procedures:      DLIF PRONE TRANSPSOAS L34, L45     LUMBAR PERCUTANEOUS PEDICLE SCREW 2 LEVEL     Application of O-Arm   Anesthesia type: General   Pre-op diagnosis: SPONDYLOLISTHESIS, LUMBAR REGION   Location: [facility] ROOM [facility] / [facility]   Surgeons: Bedelia Person, MD       DISCUSSION: Sierra Ford is an 82 year old female who presents prior to surgery listed above.  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  Prior anesthesia complications includes prolonged emergence.   Patient follows with cardiology for diastolic dysfunction and was last seen on 3/14 by Dr. Antoine Poche. She was noted to be stable at the last office visit. The diastolic dysfunction was thought to be due to right heart strain from her prior PE. She also had a remote ischemic evaluation in 2006 due to SOB and had a low risk stress test. Had a telephone visit on 5/16 for preoperative risk assessment and clearance:  "Preoperative Cardiovascular Risk Assessment: According to the Revised Cardiac Risk Index (RCRI), her Perioperative Risk of Major Cardiac Event is (%): 0.9. Her Functional Capacity in METs is: 6.05 according to the Duke Activity Status Index (DASI). The patient is doing well from a cardiac perspective. Therefore, based on ACC/AHA guidelines, the patient would be at acceptable risk for the planned procedure without further cardiovascular testing.    The patient was advised that if she develops new symptoms prior to surgery to contact our office to arrange for a follow-up visit, and she verbalized understanding.   Per office protocol, patient"  Follows PCP for other chronic medical issues including HTN and DM. BP has been controlled on current regimen. HgA1c was 6.3 on 5/28  Pt will hold her Eliquis on 6/1  Follows with Pulmonology for OSA. Does not have asthma or COPD   VS: BP (!)  132/54   Pulse 80   Temp 36.8 C   Resp 18   Ht 5\' 2"  (1.575 m)   Wt 76.4 kg   SpO2 97%   BMI 30.82 kg/m   PROVIDERS: Donato Schultz, DO Cardiology: Rollene Rotunda, MD  Pulmonology: Cyril Mourning, MD  LABS: Labs reviewed: Acceptable for surgery. (all labs ordered are listed, but only abnormal results are displayed)  Labs Reviewed  GLUCOSE, CAPILLARY - Abnormal; Notable for the following components:      Result Value   Glucose-Capillary 131 (*)    All other components within normal limits  HEMOGLOBIN A1C - Abnormal; Notable for the following components:   Hgb A1c MFr Bld 6.3 (*)    All other components within normal limits  BASIC METABOLIC PANEL - Abnormal; Notable for the following components:   Glucose, Bld 108 (*)    BUN 5 (*)    All other components within normal limits  SURGICAL PCR SCREEN  CBC  TYPE AND SCREEN     IMAGES: n/a   EKG 05/03/22:  Sinus rhythm, rate 75, left ventricular hypertrophy by voltage criteria, left axis deviation, poor anterior R wave progression, no change from previous.   CV:  Echo 11/28/21:  IMPRESSIONS     1. Left ventricular ejection fraction, by estimation, is 60 to 65%. The  left ventricle has normal function. The left ventricle has no regional  wall motion abnormalities. Left ventricular diastolic parameters are  consistent with Grade I diastolic  dysfunction (impaired relaxation).  2. Right ventricular systolic function is normal. The right ventricular  size is normal.   3. The mitral valve is normal in structure. No evidence of mitral valve  regurgitation. No evidence of mitral stenosis.   4. The aortic valve is normal in structure. Aortic valve regurgitation is  not visualized. No aortic stenosis is present.   5. The inferior vena cava is normal in size with greater than 50%  respiratory variability, suggesting right atrial pressure of 3 mmHg.   Past Medical History:  Diagnosis Date   Anxiety    Back pain     Chest pain    CLite with apical ischemia in 2006 - normal coronary arteries by [facility] in 12/2004;  Myoview 11/12:  Low risk stress nuclear study with a small, partially reversible apical defect most likely related to apical thinning; cannot R/O very mild apical ischemia.  EF: 75%    Complication of anesthesia    trouble waking after colonoscopy slept all day afterward into the next morning   Decreased hearing    Depression    Diabetes (HCC)    type II   DVT (deep venous thrombosis) (HCC) 2006   hx of and again in 2020   Ectopic pregnancy with intrauterine pregnancy    Hiatal hernia    patient not aware of hernia   Hyperlipidemia    PE (pulmonary embolism) 2006   hx of and again in 2020   Sleep apnea    CPAP     Past Surgical History:  Procedure Laterality Date   ABDOMINAL EXPLORATION SURGERY     BUNIONECTOMY Right 1985   right   COLONOSCOPY  2009   every 5 years   ECTOPIC PREGNANCY SURGERY     FOOT SURGERY Right 1985   TUBAL LIGATION      MEDICATIONS:  apixaban (ELIQUIS) 2.5 MG TABS tablet   blood glucose meter kit and supplies KIT   Calcium Carb-Cholecalciferol (CALCIUM 600 + D PO)   cholecalciferol (VITAMIN D3) 25 MCG (1000 UNIT) tablet   Coenzyme Q10 100 MG TABS   diphenhydrAMINE HCl, Sleep, (ZZZQUIL) 25 MG CAPS   Evolocumab (REPATHA SURECLICK) 140 MG/ML SOAJ   Flaxseed, Linseed, (FLAXSEED OIL) 1200 MG CAPS   furosemide (LASIX) 40 MG tablet   gabapentin (NEURONTIN) 300 MG capsule   Insulin Pen Needle (NOVOFINE PLUS PEN NEEDLE) 32G X 4 MM MISC   methocarbamol (ROBAXIN-750) 750 MG tablet   Multiple Vitamin (MULTIVITAMIN) capsule   Omega-3 Fatty Acids (FISH OIL) 1000 MG CAPS   OVER THE COUNTER MEDICATION   potassium chloride SA (KLOR-CON M) 20 MEQ tablet   rosuvastatin (CRESTOR) 10 MG tablet   Semaglutide, 1 MG/DOSE, 4 MG/3ML SOPN   traMADol (ULTRAM) 50 MG tablet   TRUE METRIX BLOOD GLUCOSE TEST test strip   TRUEPLUS LANCETS 33G MISC   No current  facility-administered medications for this encounter.   Marcille Blanco [facility] Short Stay/Anesthesiology [facility] Phone [phone] 07/18/2022 3:14 PM

## 2022-07-19 ENCOUNTER — Other Ambulatory Visit: Payer: Self-pay | Admitting: Neurosurgery

## 2022-07-20 ENCOUNTER — Encounter: Payer: Self-pay | Admitting: *Deleted

## 2022-07-20 ENCOUNTER — Telehealth: Payer: Self-pay | Admitting: *Deleted

## 2022-07-20 ENCOUNTER — Ambulatory Visit: Payer: Medicare PPO | Admitting: Family Medicine

## 2022-07-20 NOTE — Telephone Encounter (Signed)
Left message on machine to call back to reschedule appointment.    No show letter sent.

## 2022-07-20 NOTE — Telephone Encounter (Signed)
[facility] Night - Client Client Site [facility] - Night Provider Seabron Spates- MD Contact Type Call Who Is Calling Patient / Member / Family / Caregiver Caller Name Elesha Wydra Caller Phone Number [phone] Patient Name Sierra Ford Patient DOB Oct 22, 2040 Call Type Message Only Information Provided Reason for Call Request to Cancel Office Appointment Initial Comment Caller states she needs to cancel her appt. Patient request to speak to RN No Additional Comment Provided office hours. Disp. Time Disposition Final User 07/20/2022 7:48:21 AM General Information Provided Yes Green, Amy Call Closed By: Rudi Rummage Transaction Date/Time: 07/20/2022 7:45:57 AM (ET

## 2022-07-24 ENCOUNTER — Encounter (HOSPITAL_COMMUNITY)
Admission: RE | Disposition: A | Discharge status: Skilled Nursing Facility | Payer: Self-pay | Source: Home / Self Care | Attending: Neurosurgery

## 2022-07-24 ENCOUNTER — Inpatient Hospital Stay (HOSPITAL_COMMUNITY): Payer: Medicare PPO | Admitting: Medical

## 2022-07-24 ENCOUNTER — Encounter (HOSPITAL_COMMUNITY): Payer: Self-pay

## 2022-07-24 ENCOUNTER — Inpatient Hospital Stay (HOSPITAL_COMMUNITY): Payer: Medicare PPO | Admitting: Certified Registered"

## 2022-07-24 ENCOUNTER — Inpatient Hospital Stay (HOSPITAL_COMMUNITY): Payer: Medicare PPO

## 2022-07-24 ENCOUNTER — Other Ambulatory Visit: Payer: Self-pay

## 2022-07-24 ENCOUNTER — Inpatient Hospital Stay (HOSPITAL_COMMUNITY)
Admission: RE | Admit: 2022-07-24 | Discharge: 2022-07-31 | DRG: 454 | Disposition: A | Discharge status: Skilled Nursing Facility | Payer: Medicare PPO | Attending: Neurosurgery | Admitting: Neurosurgery

## 2022-07-24 DIAGNOSIS — G8929 Other chronic pain: Secondary | ICD-10-CM | POA: Diagnosis present

## 2022-07-24 DIAGNOSIS — M5116 Intervertebral disc disorders with radiculopathy, lumbar region: Secondary | ICD-10-CM

## 2022-07-24 DIAGNOSIS — R531 Weakness: Secondary | ICD-10-CM | POA: Diagnosis not present

## 2022-07-24 DIAGNOSIS — M4316 Spondylolisthesis, lumbar region: Secondary | ICD-10-CM

## 2022-07-24 DIAGNOSIS — E1165 Type 2 diabetes mellitus with hyperglycemia: Secondary | ICD-10-CM

## 2022-07-24 DIAGNOSIS — R278 Other lack of coordination: Secondary | ICD-10-CM | POA: Diagnosis not present

## 2022-07-24 DIAGNOSIS — I10 Essential (primary) hypertension: Secondary | ICD-10-CM | POA: Diagnosis not present

## 2022-07-24 DIAGNOSIS — I5040 Unspecified combined systolic (congestive) and diastolic (congestive) heart failure: Secondary | ICD-10-CM | POA: Diagnosis not present

## 2022-07-24 DIAGNOSIS — Z794 Long term (current) use of insulin: Secondary | ICD-10-CM | POA: Diagnosis not present

## 2022-07-24 DIAGNOSIS — M4726 Other spondylosis with radiculopathy, lumbar region: Secondary | ICD-10-CM | POA: Diagnosis not present

## 2022-07-24 DIAGNOSIS — Z8249 Family history of ischemic heart disease and other diseases of the circulatory system: Secondary | ICD-10-CM

## 2022-07-24 DIAGNOSIS — M1612 Unilateral primary osteoarthritis, left hip: Secondary | ICD-10-CM | POA: Diagnosis present

## 2022-07-24 DIAGNOSIS — R339 Retention of urine, unspecified: Secondary | ICD-10-CM | POA: Diagnosis not present

## 2022-07-24 DIAGNOSIS — E785 Hyperlipidemia, unspecified: Secondary | ICD-10-CM | POA: Diagnosis present

## 2022-07-24 DIAGNOSIS — M48061 Spinal stenosis, lumbar region without neurogenic claudication: Secondary | ICD-10-CM

## 2022-07-24 DIAGNOSIS — Z7901 Long term (current) use of anticoagulants: Secondary | ICD-10-CM | POA: Diagnosis not present

## 2022-07-24 DIAGNOSIS — M2578 Osteophyte, vertebrae: Secondary | ICD-10-CM | POA: Diagnosis present

## 2022-07-24 DIAGNOSIS — E1169 Type 2 diabetes mellitus with other specified complication: Secondary | ICD-10-CM | POA: Diagnosis present

## 2022-07-24 DIAGNOSIS — Z87891 Personal history of nicotine dependence: Secondary | ICD-10-CM

## 2022-07-24 DIAGNOSIS — Z79899 Other long term (current) drug therapy: Secondary | ICD-10-CM

## 2022-07-24 DIAGNOSIS — Z86711 Personal history of pulmonary embolism: Secondary | ICD-10-CM | POA: Diagnosis not present

## 2022-07-24 DIAGNOSIS — M549 Dorsalgia, unspecified: Secondary | ICD-10-CM | POA: Diagnosis not present

## 2022-07-24 DIAGNOSIS — Z9851 Tubal ligation status: Secondary | ICD-10-CM

## 2022-07-24 DIAGNOSIS — G4733 Obstructive sleep apnea (adult) (pediatric): Secondary | ICD-10-CM | POA: Diagnosis present

## 2022-07-24 DIAGNOSIS — Z86718 Personal history of other venous thrombosis and embolism: Secondary | ICD-10-CM | POA: Diagnosis not present

## 2022-07-24 DIAGNOSIS — M6281 Muscle weakness (generalized): Secondary | ICD-10-CM | POA: Diagnosis not present

## 2022-07-24 DIAGNOSIS — Z981 Arthrodesis status: Secondary | ICD-10-CM | POA: Diagnosis not present

## 2022-07-24 DIAGNOSIS — Z833 Family history of diabetes mellitus: Secondary | ICD-10-CM

## 2022-07-24 DIAGNOSIS — R52 Pain, unspecified: Secondary | ICD-10-CM | POA: Diagnosis not present

## 2022-07-24 DIAGNOSIS — I110 Hypertensive heart disease with heart failure: Secondary | ICD-10-CM | POA: Diagnosis present

## 2022-07-24 DIAGNOSIS — I5032 Chronic diastolic (congestive) heart failure: Secondary | ICD-10-CM | POA: Diagnosis present

## 2022-07-24 DIAGNOSIS — B964 Proteus (mirabilis) (morganii) as the cause of diseases classified elsewhere: Secondary | ICD-10-CM | POA: Diagnosis not present

## 2022-07-24 DIAGNOSIS — F418 Other specified anxiety disorders: Secondary | ICD-10-CM | POA: Diagnosis not present

## 2022-07-24 DIAGNOSIS — N390 Urinary tract infection, site not specified: Secondary | ICD-10-CM | POA: Diagnosis not present

## 2022-07-24 DIAGNOSIS — Z7401 Bed confinement status: Secondary | ICD-10-CM | POA: Diagnosis not present

## 2022-07-24 DIAGNOSIS — R2689 Other abnormalities of gait and mobility: Secondary | ICD-10-CM | POA: Diagnosis not present

## 2022-07-24 DIAGNOSIS — M169 Osteoarthritis of hip, unspecified: Secondary | ICD-10-CM | POA: Diagnosis not present

## 2022-07-24 HISTORY — PX: ANTERIOR LAT LUMBAR FUSION: SHX1168

## 2022-07-24 HISTORY — PX: LUMBAR PERCUTANEOUS PEDICLE SCREW 2 LEVEL: SHX5561

## 2022-07-24 LAB — GLUCOSE, CAPILLARY
Glucose-Capillary: 133 mg/dL — ABNORMAL HIGH (ref 70–99)
Glucose-Capillary: 109 mg/dL — ABNORMAL HIGH (ref 70–99)
Glucose-Capillary: 155 mg/dL — ABNORMAL HIGH (ref 70–99)
Glucose-Capillary: 160 mg/dL — ABNORMAL HIGH (ref 70–99)
Glucose-Capillary: 158 mg/dL — ABNORMAL HIGH (ref 70–99)
Glucose-Capillary: 161 mg/dL — ABNORMAL HIGH (ref 70–99)

## 2022-07-24 LAB — ABO/RH: ABO/RH(D): A POS

## 2022-07-24 SURGERY — ANTERIOR LATERAL LUMBAR FUSION 2 LEVELS
Anesthesia: General | Site: Spine Lumbar

## 2022-07-24 MED ORDER — SODIUM CHLORIDE 0.9 % IV SOLN: INTRAVENOUS | Status: DC

## 2022-07-24 MED ORDER — PHENYLEPHRINE HCL-NACL 20-0.9 MG/250ML-% IV SOLN
INTRAVENOUS | Status: DC | PRN
  Administered 2022-07-24: 30 ug/min via INTRAVENOUS

## 2022-07-24 MED ORDER — POLYETHYLENE GLYCOL 3350 17 G PO PACK
17.0000 g | PACK | Freq: Every day | ORAL | Status: DC | PRN
  Administered 2022-07-26 – 2022-07-28 (×2): 17 g via ORAL
  Filled 2022-07-24 (×2): qty 1

## 2022-07-24 MED ORDER — DEXAMETHASONE SODIUM PHOSPHATE 10 MG/ML IJ SOLN
INTRAMUSCULAR | Status: AC
  Filled 2022-07-24: qty 1

## 2022-07-24 MED ORDER — THROMBIN 5000 UNITS EX SOLR
OROMUCOSAL | Status: DC | PRN
  Administered 2022-07-24 (×2): 5 mL

## 2022-07-24 MED ORDER — EPHEDRINE 5 MG/ML INJ
INTRAVENOUS | Status: AC
  Filled 2022-07-24: qty 5

## 2022-07-24 MED ORDER — LIDOCAINE-EPINEPHRINE 1 %-1:100000 IJ SOLN
INTRAMUSCULAR | Status: AC
  Filled 2022-07-24: qty 1

## 2022-07-24 MED ORDER — CHLORHEXIDINE GLUCONATE CLOTH 2 % EX PADS: 6.0000 | MEDICATED_PAD | Freq: Once | CUTANEOUS | Status: DC

## 2022-07-24 MED ORDER — PHENYLEPHRINE 80 MCG/ML (10ML) SYRINGE FOR IV PUSH (FOR BLOOD PRESSURE SUPPORT)
PREFILLED_SYRINGE | INTRAVENOUS | Status: AC
  Filled 2022-07-24: qty 10

## 2022-07-24 MED ORDER — THROMBIN 5000 UNITS EX SOLR
CUTANEOUS | Status: AC
  Filled 2022-07-24: qty 5000

## 2022-07-24 MED ORDER — HYDROMORPHONE HCL 1 MG/ML IJ SOLN
0.2500 mg | INTRAMUSCULAR | Status: DC | PRN
  Administered 2022-07-24: 0.5 mg via INTRAVENOUS

## 2022-07-24 MED ORDER — OXYCODONE HCL 5 MG PO TABS
5.0000 mg | ORAL_TABLET | ORAL | Status: DC | PRN
  Administered 2022-07-25 – 2022-07-29 (×2): 5 mg via ORAL
  Filled 2022-07-24 (×2): qty 1

## 2022-07-24 MED ORDER — LIDOCAINE 2% (20 MG/ML) 5 ML SYRINGE
INTRAMUSCULAR | Status: AC
  Filled 2022-07-24: qty 5

## 2022-07-24 MED ORDER — LIDOCAINE 2% (20 MG/ML) 5 ML SYRINGE
INTRAMUSCULAR | Status: DC | PRN
  Administered 2022-07-24: 80 mg via INTRAVENOUS

## 2022-07-24 MED ORDER — BUPIVACAINE HCL (PF) 0.5 % IJ SOLN
INTRAMUSCULAR | Status: DC | PRN
  Administered 2022-07-24: 30 mL

## 2022-07-24 MED ORDER — CEFAZOLIN SODIUM-DEXTROSE 2-4 GM/100ML-% IV SOLN
2.0000 g | Freq: Three times a day (TID) | INTRAVENOUS | Status: AC
  Administered 2022-07-24 – 2022-07-25 (×2): 2 g via INTRAVENOUS
  Filled 2022-07-24 (×2): qty 100

## 2022-07-24 MED ORDER — INSULIN ASPART 100 UNIT/ML IJ SOLN: 0.0000 [IU] | INTRAMUSCULAR | Status: DC | PRN

## 2022-07-24 MED ORDER — METHOCARBAMOL 500 MG PO TABS
500.0000 mg | ORAL_TABLET | Freq: Four times a day (QID) | ORAL | Status: DC | PRN
  Administered 2022-07-26 – 2022-07-30 (×9): 500 mg via ORAL
  Filled 2022-07-24 (×9): qty 1

## 2022-07-24 MED ORDER — OXYCODONE HCL 5 MG PO TABS
10.0000 mg | ORAL_TABLET | ORAL | Status: DC | PRN
  Administered 2022-07-25 – 2022-07-31 (×7): 10 mg via ORAL
  Filled 2022-07-24 (×7): qty 2

## 2022-07-24 MED ORDER — AMISULPRIDE (ANTIEMETIC) 5 MG/2ML IV SOLN: 10.0000 mg | Freq: Once | INTRAVENOUS | Status: DC | PRN

## 2022-07-24 MED ORDER — CHLORHEXIDINE GLUCONATE 0.12 % MT SOLN
15.0000 mL | Freq: Once | OROMUCOSAL | Status: AC
  Administered 2022-07-24: 15 mL via OROMUCOSAL

## 2022-07-24 MED ORDER — SUCCINYLCHOLINE CHLORIDE 200 MG/10ML IV SOSY
PREFILLED_SYRINGE | INTRAVENOUS | Status: DC | PRN
  Administered 2022-07-24: 120 mg via INTRAVENOUS

## 2022-07-24 MED ORDER — POTASSIUM CHLORIDE CRYS ER 20 MEQ PO TBCR: 20.0000 meq | EXTENDED_RELEASE_TABLET | Freq: Every day | ORAL | Status: DC | PRN

## 2022-07-24 MED ORDER — COENZYME Q10 100 MG PO TABS: 100.0000 mg | ORAL_TABLET | Freq: Every day | ORAL | Status: DC

## 2022-07-24 MED ORDER — OXYCODONE HCL 5 MG PO TABS: 5.0000 mg | ORAL_TABLET | Freq: Once | ORAL | Status: DC | PRN

## 2022-07-24 MED ORDER — PROPOFOL 10 MG/ML IV BOLUS
INTRAVENOUS | Status: AC
  Filled 2022-07-24: qty 20

## 2022-07-24 MED ORDER — ONDANSETRON HCL 4 MG/2ML IJ SOLN: 4.0000 mg | Freq: Four times a day (QID) | INTRAMUSCULAR | Status: DC | PRN

## 2022-07-24 MED ORDER — ACETAMINOPHEN 325 MG PO TABS
650.0000 mg | ORAL_TABLET | ORAL | Status: DC | PRN
  Administered 2022-07-26 – 2022-07-31 (×5): 650 mg via ORAL
  Filled 2022-07-24 (×5): qty 2

## 2022-07-24 MED ORDER — LIDOCAINE-EPINEPHRINE 1 %-1:100000 IJ SOLN
INTRAMUSCULAR | Status: DC | PRN
  Administered 2022-07-24: 17 mL
  Administered 2022-07-24: 10 mL

## 2022-07-24 MED ORDER — ROCURONIUM BROMIDE 10 MG/ML (PF) SYRINGE
PREFILLED_SYRINGE | INTRAVENOUS | Status: AC
  Filled 2022-07-24: qty 10

## 2022-07-24 MED ORDER — BUPIVACAINE LIPOSOME 1.3 % IJ SUSP
INTRAMUSCULAR | Status: DC | PRN
  Administered 2022-07-24: 20 mL

## 2022-07-24 MED ORDER — ORAL CARE MOUTH RINSE: 15.0000 mL | Freq: Once | OROMUCOSAL | Status: AC

## 2022-07-24 MED ORDER — DEXAMETHASONE SODIUM PHOSPHATE 10 MG/ML IJ SOLN
INTRAMUSCULAR | Status: DC | PRN
  Administered 2022-07-24: 5 mg via INTRAVENOUS

## 2022-07-24 MED ORDER — BUPIVACAINE LIPOSOME 1.3 % IJ SUSP
INTRAMUSCULAR | Status: AC
  Filled 2022-07-24: qty 20

## 2022-07-24 MED ORDER — ONDANSETRON HCL 4 MG/2ML IJ SOLN
INTRAMUSCULAR | Status: AC
  Filled 2022-07-24: qty 2

## 2022-07-24 MED ORDER — INSULIN ASPART 100 UNIT/ML IJ SOLN
0.0000 [IU] | Freq: Three times a day (TID) | INTRAMUSCULAR | Status: DC
  Administered 2022-07-26: 2 [IU] via SUBCUTANEOUS
  Administered 2022-07-27: 3 [IU] via SUBCUTANEOUS
  Administered 2022-07-28 (×2): 2 [IU] via SUBCUTANEOUS
  Administered 2022-07-29 (×2): 3 [IU] via SUBCUTANEOUS
  Administered 2022-07-29 – 2022-07-31 (×4): 2 [IU] via SUBCUTANEOUS

## 2022-07-24 MED ORDER — DOCUSATE SODIUM 100 MG PO CAPS
100.0000 mg | ORAL_CAPSULE | Freq: Two times a day (BID) | ORAL | Status: DC
  Administered 2022-07-25 – 2022-07-31 (×12): 100 mg via ORAL
  Filled 2022-07-24 (×13): qty 1

## 2022-07-24 MED ORDER — PROPOFOL 500 MG/50ML IV EMUL
INTRAVENOUS | Status: DC | PRN
  Administered 2022-07-24: 100 ug/kg/min via INTRAVENOUS

## 2022-07-24 MED ORDER — ROSUVASTATIN CALCIUM 5 MG PO TABS
10.0000 mg | ORAL_TABLET | Freq: Every day | ORAL | Status: DC
  Administered 2022-07-25 – 2022-07-31 (×7): 10 mg via ORAL
  Filled 2022-07-24 (×7): qty 2

## 2022-07-24 MED ORDER — ACETAMINOPHEN 650 MG RE SUPP: 650.0000 mg | RECTAL | Status: DC | PRN

## 2022-07-24 MED ORDER — FLEET ENEMA 7-19 GM/118ML RE ENEM
1.0000 | ENEMA | Freq: Once | RECTAL | Status: AC | PRN
  Administered 2022-07-28: 1 via RECTAL
  Filled 2022-07-24: qty 1

## 2022-07-24 MED ORDER — 0.9 % SODIUM CHLORIDE (POUR BTL) OPTIME
TOPICAL | Status: DC | PRN
  Administered 2022-07-24: 1000 mL

## 2022-07-24 MED ORDER — FENTANYL CITRATE (PF) 250 MCG/5ML IJ SOLN
INTRAMUSCULAR | Status: DC | PRN
  Administered 2022-07-24: 100 ug via INTRAVENOUS
  Administered 2022-07-24 (×2): 50 ug via INTRAVENOUS
  Administered 2022-07-24: 100 ug via INTRAVENOUS
  Administered 2022-07-24 (×2): 50 ug via INTRAVENOUS
  Administered 2022-07-24: 100 ug via INTRAVENOUS

## 2022-07-24 MED ORDER — BUPIVACAINE HCL (PF) 0.5 % IJ SOLN
INTRAMUSCULAR | Status: AC
  Filled 2022-07-24: qty 30

## 2022-07-24 MED ORDER — ONDANSETRON HCL 4 MG PO TABS: 4.0000 mg | ORAL_TABLET | Freq: Four times a day (QID) | ORAL | Status: DC | PRN

## 2022-07-24 MED ORDER — PROPOFOL 1000 MG/100ML IV EMUL
INTRAVENOUS | Status: AC
  Filled 2022-07-24: qty 100

## 2022-07-24 MED ORDER — PROMETHAZINE HCL 25 MG/ML IJ SOLN: 6.2500 mg | INTRAMUSCULAR | Status: DC | PRN

## 2022-07-24 MED ORDER — MIDAZOLAM HCL 2 MG/2ML IJ SOLN
INTRAMUSCULAR | Status: DC | PRN
  Administered 2022-07-24 (×2): 1 mg via INTRAVENOUS

## 2022-07-24 MED ORDER — SODIUM CHLORIDE 0.9% FLUSH
3.0000 mL | Freq: Two times a day (BID) | INTRAVENOUS | Status: DC
  Administered 2022-07-25 – 2022-07-31 (×10): 3 mL via INTRAVENOUS

## 2022-07-24 MED ORDER — LIDOCAINE IN D5W 4-5 MG/ML-% IV SOLN
INTRAVENOUS | Status: DC | PRN
  Administered 2022-07-24: 1.25 mg/min via INTRAVENOUS

## 2022-07-24 MED ORDER — ACETAMINOPHEN 10 MG/ML IV SOLN
INTRAVENOUS | Status: DC | PRN
  Administered 2022-07-24: 1000 mg via INTRAVENOUS

## 2022-07-24 MED ORDER — HYDROMORPHONE HCL 1 MG/ML IJ SOLN
INTRAMUSCULAR | Status: AC
  Filled 2022-07-24: qty 0.5

## 2022-07-24 MED ORDER — INSULIN ASPART 100 UNIT/ML IJ SOLN: 0.0000 [IU] | Freq: Every day | INTRAMUSCULAR | Status: DC

## 2022-07-24 MED ORDER — HYDROMORPHONE HCL 1 MG/ML IJ SOLN
INTRAMUSCULAR | Status: AC
  Filled 2022-07-24: qty 1

## 2022-07-24 MED ORDER — ONDANSETRON HCL 4 MG/2ML IJ SOLN
INTRAMUSCULAR | Status: DC | PRN
  Administered 2022-07-24: 4 mg via INTRAVENOUS

## 2022-07-24 MED ORDER — FENTANYL CITRATE (PF) 250 MCG/5ML IJ SOLN
INTRAMUSCULAR | Status: AC
  Filled 2022-07-24: qty 5

## 2022-07-24 MED ORDER — PROPOFOL 1000 MG/100ML IV EMUL
INTRAVENOUS | Status: AC
  Filled 2022-07-24: qty 400

## 2022-07-24 MED ORDER — CEFAZOLIN SODIUM-DEXTROSE 2-4 GM/100ML-% IV SOLN
INTRAVENOUS | Status: AC
  Filled 2022-07-24: qty 100

## 2022-07-24 MED ORDER — CHLORHEXIDINE GLUCONATE 0.12 % MT SOLN
OROMUCOSAL | Status: AC
  Filled 2022-07-24: qty 15

## 2022-07-24 MED ORDER — APIXABAN 2.5 MG PO TABS
2.5000 mg | ORAL_TABLET | Freq: Two times a day (BID) | ORAL | Status: DC
  Administered 2022-07-31: 2.5 mg via ORAL
  Filled 2022-07-24: qty 1

## 2022-07-24 MED ORDER — SODIUM CHLORIDE 0.9% FLUSH: 3.0000 mL | INTRAVENOUS | Status: DC | PRN

## 2022-07-24 MED ORDER — OXYCODONE HCL 5 MG/5ML PO SOLN: 5.0000 mg | Freq: Once | ORAL | Status: DC | PRN

## 2022-07-24 MED ORDER — GABAPENTIN 300 MG PO CAPS
300.0000 mg | ORAL_CAPSULE | Freq: Every evening | ORAL | Status: DC | PRN
  Administered 2022-07-29 – 2022-07-31 (×5): 300 mg via ORAL
  Filled 2022-07-24 (×5): qty 1

## 2022-07-24 MED ORDER — HYDROMORPHONE HCL 1 MG/ML IJ SOLN
INTRAMUSCULAR | Status: DC | PRN
  Administered 2022-07-24: .5 mg via INTRAVENOUS

## 2022-07-24 MED ORDER — MIDAZOLAM HCL 2 MG/2ML IJ SOLN
INTRAMUSCULAR | Status: AC
  Filled 2022-07-24: qty 2

## 2022-07-24 MED ORDER — CEFAZOLIN SODIUM-DEXTROSE 2-4 GM/100ML-% IV SOLN
2.0000 g | INTRAVENOUS | Status: AC
  Administered 2022-07-24: 2 g via INTRAVENOUS

## 2022-07-24 MED ORDER — SODIUM CHLORIDE 0.9 % IV SOLN: 250.0000 mL | INTRAVENOUS | Status: DC

## 2022-07-24 MED ORDER — METHOCARBAMOL 1000 MG/10ML IJ SOLN: 500.0000 mg | Freq: Four times a day (QID) | INTRAVENOUS | Status: DC | PRN

## 2022-07-24 MED ORDER — MENTHOL 3 MG MT LOZG: 1.0000 | LOZENGE | OROMUCOSAL | Status: DC | PRN

## 2022-07-24 MED ORDER — HYDROMORPHONE HCL 1 MG/ML IJ SOLN
0.5000 mg | INTRAMUSCULAR | Status: DC | PRN
  Administered 2022-07-25 – 2022-07-26 (×2): 0.5 mg via INTRAVENOUS
  Filled 2022-07-24 (×3): qty 0.5

## 2022-07-24 MED ORDER — KETOROLAC TROMETHAMINE 15 MG/ML IJ SOLN
7.5000 mg | Freq: Four times a day (QID) | INTRAMUSCULAR | Status: AC
  Administered 2022-07-24 – 2022-07-25 (×4): 7.5 mg via INTRAVENOUS
  Filled 2022-07-24 (×4): qty 1

## 2022-07-24 MED ORDER — PROPOFOL 10 MG/ML IV BOLUS
INTRAVENOUS | Status: DC | PRN
  Administered 2022-07-24: 150 mg via INTRAVENOUS
  Administered 2022-07-24: 20 mg via INTRAVENOUS

## 2022-07-24 MED ORDER — PHENOL 1.4 % MT LIQD: 1.0000 | OROMUCOSAL | Status: DC | PRN

## 2022-07-24 MED ORDER — LACTATED RINGERS IV SOLN: INTRAVENOUS | Status: DC

## 2022-07-24 MED ORDER — FUROSEMIDE 40 MG PO TABS
40.0000 mg | ORAL_TABLET | Freq: Every day | ORAL | Status: DC | PRN
  Administered 2022-07-29: 40 mg via ORAL
  Filled 2022-07-24: qty 1

## 2022-07-24 SURGICAL SUPPLY — 93 items
ADH SKN CLS APL DERMABOND .7 (GAUZE/BANDAGES/DRESSINGS) ×6
BAG COUNTER SPONGE SURGICOUNT (BAG) ×6 IMPLANT
BAG SPNG CNTER NS LX DISP (BAG) ×6
BLADE CLIPPER SURG (BLADE) IMPLANT
BUR 14 MATCH 3 (BUR) IMPLANT
BUR MR8 14 BALL 5 (BUR) IMPLANT
BUR PRECISION FLUTE 5.0 (BURR) IMPLANT
BURR 14 MATCH 3 (BUR)
BURR MR8 14 BALL 5 (BUR) ×3
CANISTER SUCT 3000ML PPV (MISCELLANEOUS) ×3 IMPLANT
CAP PUSHER PTP (ORTHOPEDIC DISPOSABLE SUPPLIES) IMPLANT
CLIP SPRING STIM LLIF SAFEOP (CLIP) IMPLANT
COVERAGE SUPPORT O-ARM STEALTH (MISCELLANEOUS) ×3 IMPLANT
DERMABOND ADVANCED .7 DNX12 (GAUZE/BANDAGES/DRESSINGS) ×3 IMPLANT
DILATOR INSULATED LLIF 8-13-18 (NEUROSURGERY SUPPLIES) IMPLANT
DISSECTOR BLUNT TIP ENDO 5MM (MISCELLANEOUS) IMPLANT
DRAPE C-ARM 42X72 X-RAY (DRAPES) ×9 IMPLANT
DRAPE C-ARMOR (DRAPES) ×3 IMPLANT
DRAPE LAPAROTOMY 100X72X124 (DRAPES) ×6 IMPLANT
DRAPE ORTHO SPLIT 77X108 STRL (DRAPES)
DRAPE SHEET LG 3/4 BI-LAMINATE (DRAPES) ×12 IMPLANT
DRAPE SURG 17X23 STRL (DRAPES) ×3 IMPLANT
DRAPE SURG ORHT 6 SPLT 77X108 (DRAPES) IMPLANT
DRSG OPSITE POSTOP 3X4 (GAUZE/BANDAGES/DRESSINGS) IMPLANT
DRSG OPSITE POSTOP 4X6 (GAUZE/BANDAGES/DRESSINGS) IMPLANT
DURAPREP 26ML APPLICATOR (WOUND CARE) ×6 IMPLANT
ELECT BLADE INSULATED 6.5IN (ELECTROSURGICAL) ×3
ELECT KIT SAFEOP SSEP/SURF (KITS) ×3
ELECT REM PT RETURN 9FT ADLT (ELECTROSURGICAL) ×6
ELECTRODE BLDE INSULATED 6.5IN (ELECTROSURGICAL) ×3 IMPLANT
ELECTRODE KT SAFEOP SSEP/SURF (KITS) IMPLANT
ELECTRODE REM PT RTRN 9FT ADLT (ELECTROSURGICAL) ×6 IMPLANT
FEE COVERAGE SUPPORT O-ARM (MISCELLANEOUS) ×3 IMPLANT
GAUZE 4X4 16PLY ~~LOC~~+RFID DBL (SPONGE) IMPLANT
GAUZE SPONGE 4X4 12PLY STRL (GAUZE/BANDAGES/DRESSINGS) IMPLANT
GLOVE BIO SURGEON STRL SZ7 (GLOVE) ×18 IMPLANT
GLOVE BIOGEL PI IND STRL 7.0 (GLOVE) ×9 IMPLANT
GLOVE BIOGEL PI IND STRL 7.5 (GLOVE) ×15 IMPLANT
GLOVE ECLIPSE 7.5 STRL STRAW (GLOVE) ×6 IMPLANT
GOWN STRL REUS W/ TWL LRG LVL3 (GOWN DISPOSABLE) ×6 IMPLANT
GOWN STRL REUS W/ TWL XL LVL3 (GOWN DISPOSABLE) ×15 IMPLANT
GOWN STRL REUS W/TWL 2XL LVL3 (GOWN DISPOSABLE) IMPLANT
GOWN STRL REUS W/TWL LRG LVL3 (GOWN DISPOSABLE) ×6
GOWN STRL REUS W/TWL XL LVL3 (GOWN DISPOSABLE) ×15
GRAFT BONE PROTEIOS LRG 5CC (Orthopedic Implant) IMPLANT
GRAFT BONE PROTEIOS MED 2.5CC (Orthopedic Implant) IMPLANT
GUIDEWIRE LLIF TT 310 (WIRE) IMPLANT
HEMOSTAT POWDER KIT SURGIFOAM (HEMOSTASIS) ×3 IMPLANT
KIT BASIN OR (CUSTOM PROCEDURE TRAY) ×6 IMPLANT
KIT TURNOVER KIT B (KITS) ×6 IMPLANT
KNIFE ANNULOTOMY (BLADE) IMPLANT
LIF ILLUMINATION SYSTEM STERIL (SYSTAGENIX WOUND MANAGEMENT) ×3
MARKER SPHERE PSV REFLC NDI (MISCELLANEOUS) ×15 IMPLANT
MATRIX STRIP NEOCORE 12C (Putty) IMPLANT
MODULE POSITIONING LIF 2.0 (INSTRUMENTS) IMPLANT
NDL HYPO 18GX1.5 BLUNT FILL (NEEDLE) IMPLANT
NDL HYPO 21X1.5 SAFETY (NEEDLE) ×3 IMPLANT
NEEDLE HYPO 18GX1.5 BLUNT FILL (NEEDLE) IMPLANT
NEEDLE HYPO 21X1.5 SAFETY (NEEDLE) ×3 IMPLANT
NEEDLE HYPO 22GX1.5 SAFETY (NEEDLE) ×6 IMPLANT
NS IRRIG 1000ML POUR BTL (IV SOLUTION) ×6 IMPLANT
PACK LAMINECTOMY NEURO (CUSTOM PROCEDURE TRAY) ×6 IMPLANT
PAD ARMBOARD 7.5X6 YLW CONV (MISCELLANEOUS) ×9 IMPLANT
PIN BONE FIX 150 (PIN) IMPLANT
PROBE BALL TIP LLIF SAFEOP (NEUROSURGERY SUPPLIES) IMPLANT
ROD LORD MIS TI 5.5X70 (Rod) IMPLANT
SCREW CANN INV FENS 5.5X45 (Screw) IMPLANT
SCREW CANN INV FENS 6.5X40 (Screw) IMPLANT
SET SCREW (Screw) ×18 IMPLANT
SET SCREW SPNE (Screw) IMPLANT
SHIM INTRADISCAL LTP N DISP (ORTHOPEDIC DISPOSABLE SUPPLIES) IMPLANT
SHIM INTRADISCAL LTP W DISP (ORTHOPEDIC DISPOSABLE SUPPLIES) IMPLANT
SOL ELECTROSURG ANTI STICK (MISCELLANEOUS) ×6
SOLUTION ELECTROSURG ANTI STCK (MISCELLANEOUS) ×9 IMPLANT
SPACER IDENTITI 18X50X10 10D (Spacer) IMPLANT
SPACER IDENTITI 8X18X50 10D (Spacer) IMPLANT
SPIKE FLUID TRANSFER (MISCELLANEOUS) ×6 IMPLANT
SPONGE NEURO XRAY DETECT 1X3 (DISPOSABLE) IMPLANT
SPONGE SURGIFOAM ABS GEL SZ50 (HEMOSTASIS) IMPLANT
SPONGE T-LAP 4X18 ~~LOC~~+RFID (SPONGE) IMPLANT
SPONGE TONSIL 1 RF SGL (DISPOSABLE) IMPLANT
STAPLER VISISTAT 35W (STAPLE) ×3 IMPLANT
SUT MNCRL AB 4-0 PS2 18 (SUTURE) ×6 IMPLANT
SUT VIC AB 0 CT1 18XCR BRD8 (SUTURE) ×3 IMPLANT
SUT VIC AB 0 CT1 8-18 (SUTURE) ×3
SUT VIC AB 2-0 CP2 18 (SUTURE) ×6 IMPLANT
SYR 20ML LL LF (SYRINGE) ×3 IMPLANT
SYR 5ML LL (SYRINGE) IMPLANT
SYSTEM ILLUMINATION LIF STERIL (SYSTAGENIX WOUND MANAGEMENT) IMPLANT
TOWEL GREEN STERILE (TOWEL DISPOSABLE) ×6 IMPLANT
TOWEL GREEN STERILE FF (TOWEL DISPOSABLE) ×6 IMPLANT
TRAY FOLEY MTR SLVR 16FR STAT (SET/KITS/TRAYS/PACK) ×3 IMPLANT
WATER STERILE IRR 1000ML POUR (IV SOLUTION) ×6 IMPLANT

## 2022-07-24 NOTE — Progress Notes (Signed)
Spoke to Dr. Jean Rosenthal and will not order hyperglycemic protocol at this time.  Will recheck [facility] in 1 hour.

## 2022-07-24 NOTE — H&P (Signed)
CC: back and leg pain  HPI:     Patient is a 82 y.o. female presents with leg pain and back pain, found ot have severe lumbar stenosis and spondylolisthesis.  PT, medical therapy, and injection therapy failed to help with her symptoms.    Patient Active Problem List   Diagnosis Date Noted   Chronic pain of both shoulders 05/24/2022   Pain of lower extremity 05/02/2022   Uncontrolled type 2 diabetes mellitus with hyperglycemia (HCC) 03/09/2022   Acute left-sided low back pain with sciatica 03/09/2022   Former smoker 03/09/2022   Primary osteoarthritis of left hip 02/20/2022   Spinal stenosis of lumbar region 02/20/2022   Rotator cuff arthropathy of both shoulders 02/20/2022   Lower abdominal pain 04/04/2021   Rectal bleeding 04/04/2021   Change in bowel habits 04/04/2021   Internal hemorrhoids 04/04/2021   Preventative health care 03/07/2021   Sacroiliitis (HCC) 03/07/2021   Morbid (severe) obesity due to excess calories (HCC) 03/07/2021   Bronchitis 12/20/2020   Other pulmonary embolism with acute cor pulmonale, unspecified chronicity (HCC)    Educated about COVID-19 virus infection 01/26/2019   Hyperlipidemia associated with type 2 diabetes mellitus (HCC) 04/24/2018   Right ankle swelling 05/13/2017   Diabetes mellitus, type II (HCC) 04/08/2017   Primary hypertension 04/08/2017   Atherosclerosis of aorta (HCC) 04/08/2017   Abnormal auditory perception of both ears 10/01/2016   Bilateral impacted cerumen 10/01/2016   OSA (obstructive sleep apnea) 12/16/2015   DOE (dyspnea on exertion) 06/12/2013   Obesity (BMI 30-39.9) 04/24/2013   Chest pain, unspecified 01/22/2011   Abnormal stress test 01/22/2011   Frequent urination 01/22/2011   Chronic diastolic heart failure (HCC) 05/15/2009   CAROTID ARTERY DISEASE 01/12/2009   LEG EDEMA, RIGHT 07/27/2008   HX, URINARY INFECTION 09/19/2006   Hyperlipidemia LDL goal <70 08/20/2006   PREGNANCY, ECTOPIC NEC W/INTRAUTERINE PRG  08/20/2006   FREQUENCY, URINARY 08/20/2006   Other specified abnormal findings of blood chemistry 08/20/2006   Personal history of venous thrombosis and embolism 08/20/2006   Past Medical History:  Diagnosis Date   Anxiety    Back pain    Chest pain    CLite with apical ischemia in 2006 - normal coronary arteries by [facility] in 12/2004;  Myoview 11/12:  Low risk stress nuclear study with a small, partially reversible apical defect most likely related to apical thinning; cannot R/O very mild apical ischemia.  EF: 75%    Complication of anesthesia    trouble waking after colonoscopy slept all day afterward into the next morning   Decreased hearing    Depression    Diabetes (HCC)    type II   DVT (deep venous thrombosis) (HCC) 2006   hx of and again in 2020   Ectopic pregnancy with intrauterine pregnancy    Hiatal hernia    patient not aware of hernia   Hyperlipidemia    PE (pulmonary embolism) 2006   hx of and again in 2020   Sleep apnea    CPAP     Past Surgical History:  Procedure Laterality Date   ABDOMINAL EXPLORATION SURGERY     BUNIONECTOMY Right 1985   right   COLONOSCOPY  2009   every 5 years   ECTOPIC PREGNANCY SURGERY     FOOT SURGERY Right 1985   TUBAL LIGATION      Medications Prior to Admission  Medication Sig Dispense Refill Last Dose   apixaban (ELIQUIS) 2.5 MG TABS tablet Take 1 tablet (2.5  mg total) by mouth 2 (two) times daily. 60 tablet 5 07/20/2022   Calcium Carb-Cholecalciferol (CALCIUM 600 + D PO) Take 1 tablet by mouth daily.   Past Week   cholecalciferol (VITAMIN D3) 25 MCG (1000 UNIT) tablet Take 1,000 Units by mouth 2 (two) times daily.   Past Week   Coenzyme Q10 100 MG TABS Take 100 mg by mouth daily.   Past Week   diphenhydrAMINE HCl, Sleep, (ZZZQUIL) 25 MG CAPS Take 25 mg by mouth at bedtime as needed (sleep).   Past Month   Evolocumab (REPATHA SURECLICK) 140 MG/ML SOAJ Inject 140 mg into the skin every 14 (fourteen) days. 2 mL 11 07/16/2022    Flaxseed, Linseed, (FLAXSEED OIL) 1200 MG CAPS Take 1,200 mg by mouth daily.   Past Week   gabapentin (NEURONTIN) 300 MG capsule Take 300 mg by mouth at bedtime as needed (pain/sleep).   07/22/2022   methocarbamol (ROBAXIN-750) 750 MG tablet Take 1 tablet (750 mg total) by mouth 4 (four) times daily. (Patient taking differently: Take 750 mg by mouth 4 (four) times daily as needed for muscle spasms.) 45 tablet 1 07/24/2022 at 0500   Multiple Vitamin (MULTIVITAMIN) capsule Take 1 capsule by mouth daily with breakfast.   Past Week   Omega-3 Fatty Acids (FISH OIL) 1000 MG CAPS Take 1,000 mg by mouth daily.   Past Week   OVER THE COUNTER MEDICATION Apply 1 Application topically at bedtime. Nervive cream, apply to hands   Past Week   rosuvastatin (CRESTOR) 10 MG tablet Take 1 tablet (10 mg total) by mouth daily. 90 tablet 2 07/24/2022 at 0500   Semaglutide, 1 MG/DOSE, 4 MG/3ML SOPN Inject 1 mg as directed once a week. 3 mL 3 07/10/2022   traMADol (ULTRAM) 50 MG tablet TAKE 1 TABLET BY MOUTH EVERY 8 HOURS AS NEEDED FOR UP TO 5 DAYS 15 tablet 0 Past Month   blood glucose meter kit and supplies KIT Dispense based on patient and insurance preference. Use up to four times daily as directed. (FOR ICD-9 250.00, 250.01). 1 each 0    furosemide (LASIX) 40 MG tablet TAKE 1 TABLET DAILY (Patient taking differently: Take 40 mg by mouth daily as needed for edema.) 90 tablet 2 More than a month   Insulin Pen Needle (NOVOFINE PLUS PEN NEEDLE) 32G X 4 MM MISC As directed 100 each 1    potassium chloride SA (KLOR-CON M) 20 MEQ tablet Take 1 tablet (20 mEq total) by mouth daily. (Patient taking differently: Take 20 mEq by mouth daily as needed (when taking lasix).) 90 tablet 3 More than a month   TRUE METRIX BLOOD GLUCOSE TEST test strip TEST  UP  TO FOUR TIMES DAILY AS DIRECTED 200 each 12    TRUEPLUS LANCETS 33G MISC TEST  UP TO FOUR TIMES DAILY AS DIRECTED 200 each 12    No Known Allergies  Social History   Tobacco Use    Smoking status: Former    Packs/day: 1.00    Years: 50.00    Additional pack years: 0.00    Total pack years: 50.00    Types: Cigarettes    Quit date: 11/21/2009    Years since quitting: 12.6   Smokeless tobacco: Never  Substance Use Topics   Alcohol use: Yes    Comment: rare 3 times a year per pt    Family History  Problem Relation Age of Onset   Heart failure Mother        died  from   Colon polyps Mother    Hypertension Mother    Sudden death Mother    Obesity Mother    Alcoholism Father    Diabetes Maternal Aunt        grandaughter   Diabetes Maternal Aunt    Colon cancer Neg Hx    Esophageal cancer Neg Hx    Stomach cancer Neg Hx      Review of Systems Pertinent items noted in HPI and remainder of comprehensive ROS otherwise negative.  Objective:   Patient Vitals for the past 8 hrs:  BP Temp Temp src Pulse Resp SpO2 Height Weight  07/24/22 0739 139/72 98.6 F (37 C) Oral 85 16 98 % 5\' 2"  (1.575 m) 74.8 kg   No intake/output data recorded. No intake/output data recorded.      General : Alert, cooperative, no distress, appears stated age   Head:  Normocephalic/atraumatic    Eyes: PERRL, conjunctiva/corneas clear, EOM's intact. Fundi could not be visualized Neck: Supple Chest:  Respirations unlabored Chest wall: no tenderness or deformity Heart: Regular rate and rhythm Abdomen: Soft, nontender and nondistended Extremities: warm and well-perfused Skin: normal turgor, color and texture Neurologic:  Alert, oriented x 3.  Eyes open spontaneously. PERRL, EOMI, VFC, no facial droop. V1-3 intact.  No dysarthria, tongue protrusion symmetric.  CNII-XII intact. Normal strength, sensation and reflexes throughout.  No pronator drift, full strength in legs. + L SLR       Data ReviewCBC:  Lab Results  Component Value Date   WBC 4.6 07/17/2022   RBC 4.61 07/17/2022   BMP:  Lab Results  Component Value Date   GLUCOSE 108 (H) 07/17/2022   CO2 25 07/17/2022   BUN  5 (L) 07/17/2022   BUN 17 11/08/2021   CREATININE 0.76 07/17/2022   CREATININE 0.82 11/10/2019   CALCIUM 10.1 07/17/2022   Radiology review:  See clinic note  Assessment:   Active Problems:   * No active hospital problems. *  Lumbar stenosis with spondylolisthesis Plan:   - plan for L3-4, L4-5 DLIF today.  Risks, benefits, alternatives, and expected convalescence were discussed with her.  Risks discussed included, but were not limited to bleeding, pain, infection, scar, spinal fluid leak, neurologic deficit, instability, pseudoarthrosis, damage to nearby organs, and death.  Informed consent was obtained.

## 2022-07-24 NOTE — Transfer of Care (Signed)
Immediate Anesthesia Transfer of Care Note  Patient: Sierra Ford  Procedure(s) Performed: Direct Lumbar Interbody Fusion PRONE TRANSPSOAS Lumbar three-four, Lumbar four-five (Spine Lumbar) LUMBAR PERCUTANEOUS PEDICLE SCREW LUMBAR THREE-FOUR, LUMBAR FOUR-FIVE (Back) Application of O-Arm  Patient Location: PACU  Anesthesia Type:General  Level of Consciousness: awake and sedated  Airway & Oxygen Therapy: Patient Spontanous Breathing and Patient connected to face mask oxygen  Post-op Assessment: Report given to RN and Post -op Vital signs reviewed and stable  Post vital signs: Reviewed and stable  Last Vitals:  Vitals Value Taken Time  BP 131/102 07/24/22 1556  Temp    Pulse 100 07/24/22 1559  Resp 24 07/24/22 1559  SpO2 95 % 07/24/22 1559  Vitals shown include unvalidated device data.  Last Pain:  Vitals:   07/24/22 0800  TempSrc:   PainSc: 0-No pain         Complications: No notable events documented.

## 2022-07-24 NOTE — Op Note (Signed)
Procedure(s): Direct Lumbar Interbody Fusion PRONE TRANSPSOAS Lumbar three-four, Lumbar four-five LUMBAR PERCUTANEOUS PEDICLE SCREW LUMBAR THREE-FOUR, LUMBAR FOUR-FIVE Application of O-Arm Procedure Note  LATICHA NERI female 82 y.o. 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  She also had moderate to severe L4-5 stenosis with disc degeneration.  PT, medical therapy and injection therapy failed to control her symptoms which were very debilitating.  As such, surgical options were discussed with her.  I discussed with her the option of L3-4 and L4-5 DLIF.  Risks, benefits, alternatives, and expected convalescence were discussed with her.  Risks discussed included, but were not limited to bleeding, pain, infection, scar, spinal fluid leak, neurologic deficit, instability, pseudoarthrosis, damage to nearby organs, and death.  I also specifically discussed possibility of left thigh dysesthesias, pain, and femoral nerve neuropraxia as part of recovery of lateral approach.  Informed consent was obtained.      Surgeon: Bedelia Person   Assistants: Barnett Abu, MD, and Patrici Ranks, PA.  Please note there were no qualified trainees available to assist.  Assistance was required for instrumentation and closure.  Anesthesia: General endotracheal anesthesia   Procedure Detail  Direct Lumbar Interbody Fusion PRONE TRANSPSOAS Lumbar three-four, Lumbar four-five, LUMBAR PERCUTANEOUS PEDICLE SCREW  LUMBAR THREE-FOUR, LUMBAR FOUR-FIVE, Application of O-Arm  L3-4 lateral interbody fusion via left prone transpsoas approach L4-5 lateral interbody fusion via left prone transpsoas approach 3.  L3-4, L4-5 posterior arthrodesis 4.  L3-4-5 posterior segemental percutaneous instrumentation 5.  Use of morselized allograft  Findings: Successful interbody and posterior fusion L3-4-5  Estimated Blood Loss:  200 mL         Drains: none         Total IV Fluids: see anesthesia records  Blood Given: none          Specimens: none         Implants:  Alpha-Tec Identi-Ti Interbodies L3-4: 10 x 18 x 50 mm, 10 degree lordosis L4-5: 8 x 18 x 50 mm, 10 degree lordosis Pedicle screws 5.5 x 45 mm x2 at L3 and L4 6.5 x 40 mm at L5 70 mm rods x 2 Neocore, ProteiOs        Complications:  * No complications entered in OR log *         Disposition: PACU - hemodynamically stable.         Condition: stable      The patient was brought to the operative room.  General anesthesia was induced and patient was intubated by the anesthesia service.  After appropriate lines and monitors were placed, patient was positioned in the lateral decubitus position with the right side up.  All pressure points padded and the eyes were protected.  The patient and the bed was positioned for a true lateral and AP C-arm x-rays at each level.  The flank was preprepped with alcohol and prepped and draped in sterile fashion.  A timeout was performed.  1% at  lidocaine with epinephrine was injected in the planned incision.  Using the C-arm x-ray, a oblique incision was planned over the flank between the 2 disc spaceson the left side.  Incision was made with a 10 blade and the fascia was opened.  The external oblique, internal oblique, and transversalis muscle and transversalis fascia were opened bluntly with spreading instruments.  The retroperitoneal space was dissected and the peritoneal contents were reflected anteriorly.  The  quadratus lumborum, transverse process and finally the psoas muscle was dissected out.  A initial dilator was placed on the lateral surface of the psoas muscle over the L4-5 disc space as confirmed by x-ray.  The dilator was then passed through the psoas muscle and docked onto the disc.  Stimulation showed there was no proximity of any lumbar plexus nerves.  The dilator was secured in place with a K wire and subsequent dilators were passed and stimulated and again yielded no neural activity nearby.  Retractor was then passed onto the disc space and secured in place with a bed attachment.  The dilators were removed and the field on the posterior blade were directly stimulated.  There was no visual evidence or electromyographic evidence of nerves in the field.  The retractor was then opened cranially, caudally, and anteriorly and again the field was stimulated with no evidence of nerves in the field.  Annulotomy was performed with a retractable blade and discectomy was performed with pituitary rongeurs and box cutters.  The contralateral annulus was released with Cobb and the disc space was prepared with curettes and rasps.  At this point, decrement in the saphenous SSEPs was noted, so as the discectomy was already complete, I decided to expeditiously place the interbody.  Trial implants were placed and with the aid of fluoroscopy with AP and lateral projections, a size 8 mm graft was placed.  The retractor was then removed. SSEPs returned to baseline.   Attention was then turned to the L3-4 disc space where the lateral aspect of the psoas was palpated and initial dilator was placed over it with positioning confirmed by x-ray.  The dilator was then placed over the lateral osteophyte at the disc space and stimulated which revealed the lumbar plexus nerves were posterior to the dilator but remote.  In similar fashion, subsequent dilators were placed and stimulated and finally the retractor was placed and secured to  the bed attachment.  The field was stimulated and no nerves were able to be identified in the field.  Posterior shim was placed to lock the retractor in place and the retractor blades were opened cranially, caudally, and anteriorly.  The collapsed disc space was entered with a osteotome using x-ray guidance.  Subsequently, Cobb was used to release the contralateral annulus.  This allowed good opening of the disc space.  The disc was fairly degenerated, with minimal disc remnants, but complete discectomy was performed with curettes and pituitary rongeurs.  The endplates were prepared with rasps.  Trials and a final size 10 mm  implant was then placed under x-ray guidance.  Good reduction of sponylolisthesis and restoration of disc space height was noted.The retractor was then removed.   There were no changes in the saphenous SSEPs throughout this second discectomy.  Final x-rays showed good reduction of spondylolisthesis and lateral listhesis, restoration of disc space height and foraminal height.  The retroperitoneal space was then irrigated and examined with no evidence of any bleeding.  The fascia was closed with 0 Vicryl stitches.  The  dermal layer was closed with 2-0 Vicryl stitches.  Skin was closed with 4-0 Monocryl subcuticular manner followed by Dermabond.   A PSIS iliac spine was placed, and intraoperative CT obtained to allow for neuronavigation.  Using the Stealth navigation, paramedian incisions were planned for pedicle screw trajectories.  Incisions were made with a 10 blade.  Navigated high speed drill was used to cannulate the L3, L4, and L5 pedicles bilaterally, and pedicles were then tapped using navigation.  Ball ended feeler confirmed good cannulation with no breaches.  Appropriately sized pedicle screws were placed using navigation.  Good purchase was noted.    Rods were then passed through the screw towers bilaterally and secured with screw caps.  C-arm AP and lateral X-ray confirmed good  placement of posterior instrumentation.  The screw caps were final tightened and the towers removed.  Navigated drill was used to decorticate the facet joints of L3-4 and L4-5, and morselized allograft was placed over the decorticated joints with tamp.    Wounds were irrigated thoroughly .  Exparel mixed with Marcaine was injected into the paraspinous muscles and subcutaneous tissues bilaterally.  The fascia was closed with 0 Vicryl stitches.  The dermal layer was closed with 2-0 Vicryl stitches in buried interrupted fashion.  The skin incisions were closed with 4-0 Monocryl subcuticular manner followed by Dermabond.  The PSIS pin was removed and small incision closed with 3-0 vicryl in buried fashion and Dermabond.  Sterile dressings were placed.  Patient was then flipped supine and extubated by the anesthesia service following commands and all 4 extremities.  All counts were correct at the end of surgery.  No complications were noted.

## 2022-07-24 NOTE — Anesthesia Postprocedure Evaluation (Signed)
Anesthesia Post Note  Patient: Sierra Ford  Procedure(s) Performed: Direct Lumbar Interbody Fusion PRONE TRANSPSOAS Lumbar three-four, Lumbar four-five (Spine Lumbar) LUMBAR PERCUTANEOUS PEDICLE SCREW LUMBAR THREE-FOUR, LUMBAR FOUR-FIVE (Back) Application of O-Arm     Patient location during evaluation: PACU Anesthesia Type: General Level of consciousness: patient cooperative, oriented and sedated Pain management: pain level controlled Vital Signs Assessment: post-procedure vital signs reviewed and stable Respiratory status: spontaneous breathing, nonlabored ventilation, respiratory function stable and patient connected to nasal cannula oxygen Cardiovascular status: blood pressure returned to baseline and stable Postop Assessment: no apparent nausea or vomiting Anesthetic complications: no   No notable events documented.  Last Vitals:  Vitals:   07/24/22 1800 07/24/22 1815  BP: 122/68 132/74  Pulse: 76 77  Resp: (!) 9 15  Temp:    SpO2: 98% 95%    Last Pain:  Vitals:   07/24/22 0800  TempSrc:   PainSc: 0-No pain                 Gavyn Zoss,E. Theodor Mustin

## 2022-07-24 NOTE — Progress Notes (Signed)
CBG 109 

## 2022-07-24 NOTE — Anesthesia Procedure Notes (Signed)

## 2022-07-25 LAB — GLUCOSE, CAPILLARY
Glucose-Capillary: 104 mg/dL — ABNORMAL HIGH (ref 70–99)
Glucose-Capillary: 100 mg/dL — ABNORMAL HIGH (ref 70–99)
Glucose-Capillary: 116 mg/dL — ABNORMAL HIGH (ref 70–99)
Glucose-Capillary: 98 mg/dL (ref 70–99)
Glucose-Capillary: 131 mg/dL — ABNORMAL HIGH (ref 70–99)

## 2022-07-25 NOTE — TOC Transition Note (Addendum)
Transition of Care [facility]) - CM/SW Discharge Note   Patient Details  Name: Sierra Ford MRN: [id number] Date of Birth: Jan 19, 1941  [facility] CM/SW Contact:  Eduard Roux, LCSW Phone Number: 07/25/2022, 10:39 AM   Clinical Narrative:     CSW met with patient - CSW introduced self and explained role. CSW discussed therapy recommendations. Patient inquired about CIR- informed of Rehab Admission Coordinator note. Patient reports she lives home alone. Patient agreeable to CSW sending referrals to SNFs at this time. Patient states never been to rehab before. CSW explained the SNF process. All questions answered.   TOC will provide bed offers once available  TOC will continue to follow and assist with discharge planning.  Antony Blackbird, MSW, LCSW Clinical Social Worker    Final next level of care: Skilled Nursing Facility Barriers to Discharge: English as a second language teacher, SNF Pending bed offer, Continued Medical Work up   Patient Goals and CMS Choice      Discharge Placement                         Discharge Plan and Services Additional resources added to the After Visit Summary for   In-house Referral: Clinical Social Work                                   Social Determinants of Health (SDOH) Interventions SDOH Screenings   Food Insecurity: No Food Insecurity (07/24/2022)  Housing: Low Risk  (07/24/2022)  Transportation Needs: No Transportation Needs (07/24/2022)  Utilities: Not At Risk (07/24/2022)  Alcohol Screen: Low Risk  (08/15/2020)  Depression (PHQ2-9): Low Risk  (03/09/2022)  Financial Resource Strain: Medium Risk (03/20/2021)  Physical Activity: Sufficiently Active (08/15/2020)  Social Connections: Moderately Integrated (08/15/2020)  Stress: No Stress Concern Present (08/15/2020)  Tobacco Use: Medium Risk (07/24/2022)     Readmission Risk Interventions     No data to display

## 2022-07-25 NOTE — NC FL2 (Signed)
[place] MEDICAID FL2 LEVEL OF CARE FORM     IDENTIFICATION  Patient Name: Sierra Ford Birthdate: [birth year] Sex: female Admission Date (Current Location): 07/24/2022  [facility] and [place] Number:  Producer, television/film/video and Address:  [facility], [address] [place], [place] [address]      Provider Number: [id number]  Attending Physician Name and Address:  Bedelia Person, MD  Relative Name and Phone Number:       Current Level of Care: Hospital Recommended Level of Care: Skilled Nursing Facility Prior Approval Number:    Date Approved/Denied:   PASRR Number: [id number]  Discharge Plan: SNF    Current Diagnoses: Patient Active Problem List   Diagnosis Date Noted   Lumbar stenosis 07/24/2022   Other spondylosis with radiculopathy, lumbar region 07/24/2022   Chronic pain of both shoulders 05/24/2022   Pain of lower extremity 05/02/2022   Uncontrolled type 2 diabetes mellitus with hyperglycemia (HCC) 03/09/2022   Acute left-sided low back pain with sciatica 03/09/2022   Former smoker 03/09/2022   Primary osteoarthritis of left hip 02/20/2022   Spinal stenosis of lumbar region 02/20/2022   Rotator cuff arthropathy of both shoulders 02/20/2022   Lower abdominal pain 04/04/2021   Rectal bleeding 04/04/2021   Change in bowel habits 04/04/2021   Internal hemorrhoids 04/04/2021   Preventative health care 03/07/2021   Sacroiliitis (HCC) 03/07/2021   Morbid (severe) obesity due to excess calories (HCC) 03/07/2021   Bronchitis 12/20/2020   Other pulmonary embolism with acute cor pulmonale, unspecified chronicity (HCC)    Educated about COVID-19 virus infection 01/26/2019   Hyperlipidemia associated with type 2 diabetes mellitus (HCC) 04/24/2018   Right ankle swelling 05/13/2017   Diabetes mellitus, type II (HCC) 04/08/2017   Primary hypertension 04/08/2017   Atherosclerosis of aorta (HCC) 04/08/2017   Abnormal auditory perception of  both ears 10/01/2016   Bilateral impacted cerumen 10/01/2016   OSA (obstructive sleep apnea) 12/16/2015   DOE (dyspnea on exertion) 06/12/2013   Obesity (BMI 30-39.9) 04/24/2013   Chest pain, unspecified 01/22/2011   Abnormal stress test 01/22/2011   Frequent urination 01/22/2011   Chronic diastolic heart failure (HCC) 05/15/2009   CAROTID ARTERY DISEASE 01/12/2009   LEG EDEMA, RIGHT 07/27/2008   HX, URINARY INFECTION 09/19/2006   Hyperlipidemia LDL goal <70 08/20/2006   PREGNANCY, ECTOPIC NEC W/INTRAUTERINE PRG 08/20/2006   FREQUENCY, URINARY 08/20/2006   Other specified abnormal findings of blood chemistry 08/20/2006   Personal history of venous thrombosis and embolism 08/20/2006    Orientation RESPIRATION BLADDER Height & Weight     Self, Time, Situation, Place  Normal External catheter, Continent Weight: 165 lb (74.8 kg) Height:  5\' 2"  (157.5 cm)  BEHAVIORAL SYMPTOMS/MOOD NEUROLOGICAL BOWEL NUTRITION STATUS      Continent Diet (please see discharge summary)  AMBULATORY STATUS COMMUNICATION OF NEEDS Skin   Limited Assist Verbally Surgical wounds (closed incision left, back)                       Personal Care Assistance Level of Assistance  Feeding, Dressing, Bathing Bathing Assistance: Limited assistance Feeding assistance: Independent Dressing Assistance: Limited assistance     Functional Limitations Info  Sight, Hearing, Speech Sight Info: Adequate (wears glasses) Hearing Info: Adequate Speech Info: Adequate    SPECIAL CARE FACTORS FREQUENCY  PT (By licensed PT), OT (By licensed OT)     PT Frequency: 5x per week OT Frequency: 5x per week  Contractures Contractures Info: Not present    Additional Factors Info  Code Status, Allergies Code Status Info: FULL Allergies Info: NKA           Current Medications (07/25/2022):  This is the current hospital active medication list Current Facility-Administered Medications  Medication Dose  Route Frequency Provider Last Rate Last Admin   0.9 %  sodium chloride infusion  250 mL Intravenous Continuous Patrici Ranks [place], PA-C       0.9 %  sodium chloride infusion   Intravenous Continuous Clovis Riley, PA-C 75 mL/hr at 07/24/22 1953 New Bag at 07/24/22 1953   acetaminophen (TYLENOL) tablet 650 mg  650 mg Oral Q4H PRN Clovis Riley, PA-C       Or   acetaminophen (TYLENOL) suppository 650 mg  650 mg Rectal Q4H PRN Patrici Ranks [place], PA-C       [START ON 07/31/2022] apixaban (ELIQUIS) tablet 2.5 mg  2.5 mg Oral BID Patrici Ranks [place], PA-C       docusate sodium (COLACE) capsule 100 mg  100 mg Oral BID Patrici Ranks [place], PA-C   100 mg at 07/25/22 1128   furosemide (LASIX) tablet 40 mg  40 mg Oral Daily PRN Patrici Ranks [place], PA-C       gabapentin (NEURONTIN) capsule 300 mg  300 mg Oral QHS PRN Patrici Ranks [place], PA-C       HYDROmorphone (DILAUDID) injection 0.5 mg  0.5 mg Intravenous Q3H PRN Patrici Ranks [place], PA-C   0.5 mg at 07/25/22 0414   insulin aspart (novoLOG) injection 0-15 Units  0-15 Units Subcutaneous TID WC Patrici Ranks [place], PA-C       insulin aspart (novoLOG) injection 0-5 Units  0-5 Units Subcutaneous QHS Patrici Ranks [place], PA-C       ketorolac (TORADOL) 15 MG/ML injection 7.5 mg  7.5 mg Intravenous Q6H Patrici Ranks [place], PA-C   7.5 mg at 07/25/22 0510   menthol-cetylpyridinium (CEPACOL) lozenge 3 mg  1 lozenge Oral PRN Patrici Ranks [place], PA-C       Or   phenol (CHLORASEPTIC) mouth spray 1 spray  1 spray Mouth/Throat PRN Patrici Ranks [place], PA-C       methocarbamol (ROBAXIN) tablet 500 mg  500 mg Oral Q6H PRN Patrici Ranks [place], PA-C       Or   methocarbamol (ROBAXIN) 500 mg in dextrose 5 % 50 mL IVPB  500 mg Intravenous Q6H PRN Patrici Ranks [place], PA-C       ondansetron [facility]) tablet 4 mg  4 mg Oral Q6H PRN Patrici Ranks [place], PA-C       Or   ondansetron [facility]) injection 4 mg  4 mg  Intravenous Q6H PRN Patrici Ranks [place], PA-C       oxyCODONE (Oxy IR/ROXICODONE) immediate release tablet 10 mg  10 mg Oral Q4H PRN Patrici Ranks [place], PA-C       oxyCODONE (Oxy IR/ROXICODONE) immediate release tablet 5 mg  5 mg Oral Q4H PRN Patrici Ranks [place], PA-C       polyethylene glycol (MIRALAX / GLYCOLAX) packet 17 g  17 g Oral Daily PRN Patrici Ranks [place], PA-C       potassium chloride SA (KLOR-CON M) CR tablet 20 mEq  20 mEq Oral Daily PRN Patrici Ranks [place], PA-C       rosuvastatin (CRESTOR) tablet 10 mg  10 mg Oral Daily Patrici Ranks [place], PA-C   10 mg at 07/25/22 1127   sodium chloride flush (NS) 0.9 %  injection 3 mL  3 mL Intravenous Q12H Patrici Ranks [place], [place]   3 mL at 07/25/22 1000   sodium chloride flush (NS) 0.9 % injection 3 mL  3 mL Intravenous PRN Patrici Ranks [place], PA-C       sodium phosphate (FLEET) 7-19 GM/118ML enema 1 enema  1 enema Rectal Once PRN Clovis Riley, PA-C         Discharge Medications: Please see discharge summary for a list of discharge medications.  Relevant Imaging Results:  Relevant Lab Results:   Additional Information SSN [id number]  Eduard Roux, LCSW

## 2022-07-25 NOTE — Evaluation (Signed)
Occupational Therapy Evaluation Patient Details Name: Sierra Ford MRN: [id number] DOB: 1940/04/30 Today's Date: 07/25/2022   History of Present Illness 82 y.o. female presents to [facility] hospital on 07/24/2022 with lumbar stenosis and spondylolisthesis, underwent lateral interbody fusion L3-5 with percutaneous pedicle screw placement on 6/4. Pt now with limited AROM and sensation LLE. PMH includes anxiety, depression, DMII, HLD, OSA, PE.   Clinical Impression   This 82 yo female admitted and underwent above presents to acute OT with PLOF of being Mod I with all basic ADLs from a rollator standpoint and doing most of her IADLs. She currently is setup/S (supported sitting)-total A for basic ADLs, cannot maintain forward sitting balance in recliner without UE support, cannot stand totally upright and needs mod-max A for squat pivot transfers depending what direction she is transferring. She will continue to benefit from acute OT with follow up from intensive inpatient follow up therapy, >3 hours/day.       Recommendations for follow up therapy are one component of a multi-disciplinary discharge planning process, led by the attending physician.  Recommendations may be updated based on patient status, additional functional criteria and insurance authorization.   Assistance Recommended at Discharge Frequent or constant Supervision/Assistance  Patient can return home with the following Two people to help with walking and/or transfers;Two people to help with bathing/dressing/bathroom;Assistance with cooking/housework;Help with stairs or ramp for entrance;Assist for transportation    Functional Status Assessment  Patient has had a recent decline in their functional status and demonstrates the ability to make significant improvements in function in a reasonable and predictable amount of time.  Equipment Recommendations  Other (comment) (TBD next venue)       Precautions / Restrictions  Precautions Precautions: Fall;Back Precaution Booklet Issued: No Precaution Comments: verbally reviewed back precautions Required Braces or Orthoses:  (no brace needed per orders) Restrictions Weight Bearing Restrictions: No      Mobility Bed Mobility               General bed mobility comments: Pt up in recliner upon arrival    Transfers Overall transfer level: Needs assistance   Transfers: Bed to chair/wheelchair/BSC     Squat pivot transfers: Max assist, Mod assist       General transfer comment: Mod A going to right side, Max A going to left side (both level surfaces)      Balance Overall balance assessment: Needs assistance Sitting-balance support: No upper extremity supported, Feet supported   Sitting balance - Comments: poor to zero in recliner sitting forward; can maintain forward sitting in recliner with hands on both arms of recliner       Standing balance comment: unable to full stand and maintain due to LLE not supporting her                           ADL either performed or assessed with clinical judgement   ADL Overall ADL's : Needs assistance/impaired Eating/Feeding: Independent;Sitting Eating/Feeding Details (indicate cue type and reason): in recliner Grooming: Set up;Sitting Grooming Details (indicate cue type and reason): recliner Upper Body Bathing: Minimal assistance;Sitting Upper Body Bathing Details (indicate cue type and reason): recliner Lower Body Bathing: Total assistance;Bed level Lower Body Bathing Details (indicate cue type and reason): Pt cannot stand nor can she maintain sitting balance for lateral leans to wash front and back peri area Upper Body Dressing : Total assistance;Sitting Upper Body Dressing Details (indicate cue type and reason): recliner Lower  Body Dressing: Total assistance;Bed level;Sitting/lateral leans Lower Body Dressing Details (indicate cue type and reason): Pt cannot stand nor can she maintain  sitting balance for lateral leans to pull up/down clothing Toilet Transfer: Maximal assistance;Squat-pivot;Comfort height toilet;Grab bars   Toileting- Clothing Manipulation and Hygiene: Moderate assistance Toileting - Clothing Manipulation Details (indicate cue type and reason): can wipe front peri area, but not back while seated and cannot stand to do this either             Vision Patient Visual Report: No change from baseline              Pertinent Vitals/Pain Pain Assessment Pain Assessment: 0-10 Pain Score: 5  Pain Location: back Pain Descriptors / Indicators: Sore Pain Intervention(s): Limited activity within patient's tolerance, Monitored during session, Repositioned     Hand Dominance Right   Extremity/Trunk Assessment Upper Extremity Assessment Upper Extremity Assessment: Generalized weakness      Communication Communication Communication: No difficulties   Cognition Arousal/Alertness: Awake/alert Behavior During Therapy: WFL for tasks assessed/performed Overall Cognitive Status: Within Functional Limits for tasks assessed                                       General Comments  VSS on RA    Exercises Other Exercises Other Exercises: Decreased sensation and movement LLE since surgery. Pt reports it was weak prior to surgery but she could move and feel it.    Home Living Family/patient expects to be discharged to:: Inpatient rehab Living Arrangements: Alone Available Help at Discharge: Family;Available 24 hours/day (for next 2-3 weeks) Type of Home: House Home Access: Stairs to enter [employer] of Steps: 1 Entrance Stairs-Rails: None Home Layout: One level     Bathroom Shower/Tub: Producer, television/film/video: Standard     Home Equipment: Agricultural consultant (2 wheels);Rollator (4 wheels);Cane - single point;Shower seat;Wheelchair - manual          Prior Functioning/Environment Prior Level of Function :  Independent/Modified Independent             Mobility Comments: ambulatory with rollator          OT Problem List: Decreased strength;Decreased range of motion;Impaired balance (sitting and/or standing);Pain;Decreased knowledge of use of DME or AE;Decreased knowledge of precautions;Impaired sensation      OT Treatment/Interventions: Self-care/ADL training;DME and/or AE instruction;Patient/family education;Balance training    OT Goals(Current goals can be found in the care plan section) Acute Rehab OT Goals Patient Stated Goal: to go to rehab here OT Goal Formulation: With patient/family Time For Goal Achievement: 08/08/22 Potential to Achieve Goals: Good  OT Frequency: Min 1X/week       AM-PAC OT "6 Clicks" Daily Activity     Outcome Measure Help from another person eating meals?: None Help from another person taking care of personal grooming?: A Little Help from another person toileting, which includes using toliet, bedpan, or urinal?: A Lot Help from another person bathing (including washing, rinsing, drying)?: Total Help from another person to put on and taking off regular upper body clothing?: A Lot Help from another person to put on and taking off regular lower body clothing?: Total 6 Click Score: 13   End of Session Equipment Utilized During Treatment: Gait belt Nurse Communication: Mobility status (for toileting from recliner standpoint; PT is to talk to RN and NT about transfers to/from bed)  Activity Tolerance: Patient  tolerated treatment well Patient left: in chair;with call bell/phone within reach;with chair alarm set;with family/visitor present  OT Visit Diagnosis: Unsteadiness on feet (R26.81);Other abnormalities of gait and mobility (R26.89);Muscle weakness (generalized) (M62.81);Pain Pain - Right/Left:  (incisional)                Time: 1610-9604 OT Time Calculation (min): 38 min Charges:  OT General Charges $OT Visit: 1 Visit OT Evaluation $OT Eval  Moderate Complexity: 1 Mod OT Treatments $Self Care/Home Management : 23-37 mins  Lindon Romp OT Acute Rehabilitation Services Office [phone]    Evette Georges 07/25/2022, 11:31 AM

## 2022-07-25 NOTE — Evaluation (Signed)
Physical Therapy Evaluation Patient Details Name: Sierra Ford MRN: [id number] DOB: Aug 14, 1940 Today's Date: 07/25/2022  History of Present Illness  82 y.o. female presents to [facility] hospital on 07/24/2022 with lumbar stenosis and spondylolisthesis, underwent lateral interbody fusion L3-5 with percutaneous pedicle screw placement on 6/4. PMH includes anxiety, depression, DMII, HLD, OSA, PE.  Clinical Impression  Pt presents to PT with deficits in functional mobility, gait, balance, strength, power, endurance, sensation, proprioception. Pt is at a high risk for falls due to LLE weakness and impaired sensation. Pt often with L hip and knee flexion in standing, unable to extend without visual input. Pt requires significant physical assistance for bed mobility and transfers at this time, with gait training deferred due to LLE deficits. PT recommends high intensity inpatient PT services at the time of discharge, as the pt was independent prior to admission and demonstrates the potential to return to this level with intensive PT services.       Recommendations for follow up therapy are one component of a multi-disciplinary discharge planning process, led by the attending physician.  Recommendations may be updated based on patient status, additional functional criteria and insurance authorization.  Follow Up Recommendations       Assistance Recommended at Discharge Frequent or constant Supervision/Assistance  Patient can return home with the following  A lot of help with walking and/or transfers;A lot of help with bathing/dressing/bathroom;Assistance with cooking/housework;Assist for transportation;Help with stairs or ramp for entrance    Equipment Recommendations BSC/3in1  Recommendations for Other Services  Rehab consult    Functional Status Assessment Patient has had a recent decline in their functional status and demonstrates the ability to make significant improvements in function in a reasonable  and predictable amount of time.     Precautions / Restrictions Precautions Precautions: Fall;Back Precaution Booklet Issued: No Precaution Comments: verbally reviewed back precautions Required Braces or Orthoses:  (no brace needed) Restrictions Weight Bearing Restrictions: No      Mobility  Bed Mobility Overal bed mobility: Needs Assistance Bed Mobility: Rolling, Sidelying to Sit Rolling: Min guard Sidelying to sit: Mod assist            Transfers Overall transfer level: Needs assistance Equipment used: Rolling walker (2 wheels), 1 person hand held assist Transfers: Sit to/from Stand, Bed to chair/wheelchair/BSC Sit to Stand: Mod assist, From elevated surface     Squat pivot transfers: Mod assist     General transfer comment: pt with impaired proprioception and sensation in LLE, resulting in impaired ability to extend L knee and hip. Pt often flexed at L knee, with toe touch only on LLE. Pt requires significant assistance to pivot from bed to recliner    Ambulation/Gait Ambulation/Gait assistance:  (deferred due to high falls risk and LLE impairments)                Stairs            Wheelchair Mobility    Modified Rankin (Stroke Patients Only)       Balance Overall balance assessment: Needs assistance Sitting-balance support: Single extremity supported, Bilateral upper extremity supported, Feet supported Sitting balance-Leahy Scale: Poor Sitting balance - Comments: minA Postural control: Posterior lean Standing balance support: Bilateral upper extremity supported Standing balance-Leahy Scale: Poor Standing balance comment: min-modA                             Pertinent Vitals/Pain Pain Assessment Pain Assessment: Faces Faces  Pain Scale: Hurts little more Pain Location: back Pain Descriptors / Indicators: Sore Pain Intervention(s): Monitored during session    Home Living Family/patient expects to be discharged to:: Private  residence Living Arrangements: Alone Available Help at Discharge: Family;Available 24 hours/day (daughter, other family members) Type of Home: House Home Access: Stairs to enter Entrance Stairs-Rails: None Entrance Stairs-Number of Steps: 1   Home Layout: One level Home Equipment: Agricultural consultant (2 wheels);Rollator (4 wheels);Cane - single point;Shower seat;Wheelchair - manual      Prior Function Prior Level of Function : Independent/Modified Independent             Mobility Comments: ambulatory with rollator       Hand Dominance   Dominant Hand: Right    Extremity/Trunk Assessment   Upper Extremity Assessment Upper Extremity Assessment: Generalized weakness    Lower Extremity Assessment Lower Extremity Assessment: LLE deficits/detail;RLE deficits/detail RLE Deficits / Details: RLE grossly 4-/5 LLE Deficits / Details: L thigh and buttock numbness, knee extension 2-/5, ankle PF/DF 4-/5    Cervical / Trunk Assessment Cervical / Trunk Assessment: Back Surgery  Communication   Communication: No difficulties  Cognition Arousal/Alertness: Awake/alert Behavior During Therapy: WFL for tasks assessed/performed Overall Cognitive Status: Within Functional Limits for tasks assessed                                          General Comments General comments (skin integrity, edema, etc.): VSS on RA    Exercises General Exercises - Lower Extremity Quad Sets: AROM, Left, 5 reps Gluteal Sets: AROM, Left, 5 reps   Assessment/Plan    PT Assessment Patient needs continued PT services  PT Problem List Decreased strength;Decreased activity tolerance;Decreased balance;Decreased mobility;Decreased knowledge of use of DME;Decreased safety awareness;Decreased knowledge of precautions;Impaired sensation;Pain       PT Treatment Interventions DME instruction;Gait training;Stair training;Therapeutic activities;Functional mobility training;Therapeutic exercise;Balance  training;Neuromuscular re-education;Patient/family education    PT Goals (Current goals can be found in the Care Plan section)  Acute Rehab PT Goals Patient Stated Goal: to improve LLE strenght and reduce falls risk PT Goal Formulation: With patient Time For Goal Achievement: 08/08/22 Potential to Achieve Goals: Fair    Frequency Min 5X/week     Co-evaluation               AM-PAC PT "6 Clicks" Mobility  Outcome Measure Help needed turning from your back to your side while in a flat bed without using bedrails?: A Little Help needed moving from lying on your back to sitting on the side of a flat bed without using bedrails?: A Lot Help needed moving to and from a bed to a chair (including a wheelchair)?: A Lot Help needed standing up from a chair using your arms (e.g., wheelchair or bedside chair)?: A Lot Help needed to walk in hospital room?: Total Help needed climbing 3-5 steps with a railing? : Total 6 Click Score: 11    End of Session Equipment Utilized During Treatment: Gait belt Activity Tolerance: Patient tolerated treatment well Patient left: in chair;with call bell/phone within reach;with chair alarm set Nurse Communication: Mobility status PT Visit Diagnosis: Other abnormalities of gait and mobility (R26.89);Muscle weakness (generalized) (M62.81);Pain    Time: 1610-9604 PT Time Calculation (min) (ACUTE ONLY): 24 min   Charges:   PT Evaluation $PT Eval Low Complexity: 1 Low  Arlyss Gandy, PT, DPT Acute Rehabilitation Office [phone]   Arlyss Gandy 07/25/2022, 9:22 AM

## 2022-07-25 NOTE — Progress Notes (Signed)
  Inpatient Rehab Admissions Coordinator :  Per therapy recommendations, patient was screened for CIR candidacy by Ottie Glazier RN MSN. Patient is not yet at a level to tolerate the intensity required to pursue a CIR admit. I also question if [facility] Medicare would approve for this diagnosis. Recommend other rehab venues to be pursued at this time. Please contact me with any questions.  Ottie Glazier RN MSN Admissions Coordinator [phone]

## 2022-07-25 NOTE — Progress Notes (Signed)
Subjective: NAEs o/n. Pt reports some L anterior thigh decreased sensation. Notes preop weakness to this area.   Objective: Vital signs in last 24 hours: Temp:  [97.5 F (36.4 C)-98.2 F (36.8 C)] 98.2 F (36.8 C) (06/05 0657) Pulse Rate:  [71-100] 91 (06/04 1951) Resp:  [9-24] 19 (06/05 0657) BP: (117-154)/(54-94) 136/92 (06/05 0657) SpO2:  [95 %-100 %] 100 % (06/05 0300)  Intake/Output from previous day: 06/04 0701 - 06/05 0700 In: 2096.7 [I.V.:1996.7; IV Piggyback:100] Out: 1780 [Urine:1380; Blood:400] Intake/Output this shift: Total I/O In: -  Out: 275 [Urine:275]  Awake, alert, oriented Speech fluent, appropriate 5/5 BUE grossly 4/5 PF/DF/KF/KE BLE. 2/5 HF LLE. 4/5 HF RLE. SILTx4 grossly, Fcx4, MAEx4 Wounds c/d/i   Lab Results: No results for input(s): "WBC", "HGB", "HCT", "PLT" in the last 72 hours. BMET No results for input(s): "NA", "K", "CL", "CO2", "GLUCOSE", "BUN", "CREATININE", "CALCIUM" in the last 72 hours.  Studies/Results: DG Lumbar Spine 2-3 Views  Result Date: 07/24/2022 CLINICAL DATA:  PLIF L3-L5. EXAM: LUMBAR SPINE - 2-3 VIEW; DG C-ARM 1-60 MIN-NO REPORT COMPARISON:  Lumbar spine MRI-02/07/2022 FLUOROSCOPY TIME: FLUOROSCOPY TIME 3 minutes, 31 seconds (126.6 mGy) FINDINGS: Two spot fluoroscopic intraoperative images of the lower lumbar spine are provided for review. Spinal labeling is based on the presumption that the most inferior vertebral body is L5. Provided images demonstrate the sequela of ongoing L3-L5 posterior paraspinal fusion and intervertebral disc space replacement. Interval restoration of the L3-L4 and L4-L5 intervertebral disc spaces. Alignment appears anatomic. Overlying surgical support apparatus. No radiopaque foreign body. IMPRESSION: Ongoing L3-L5 posterior paraspinal fusion intervertebral disc space replacement as above. Electronically Signed   By: Simonne Come M.D.   On: 07/24/2022 15:56   DG C-Arm 1-60 Min-No Report  Result Date:  07/24/2022 Fluoroscopy was utilized by the requesting physician.  No radiographic interpretation.   DG C-Arm 1-60 Min-No Report  Result Date: 07/24/2022 Fluoroscopy was utilized by the requesting physician.  No radiographic interpretation.   DG O-ARM IMAGE ONLY/NO REPORT  Result Date: 07/24/2022 There is no Radiologist interpretation  for this exam.   Assessment/Plan: S/p L3-5 DLIF w/ percutaneous instrumentation POD#1  -Continue PT/OT as tolerated -Consider CIR vs SNF for d/c planning.    Risa Auman CAYLIN Press [place] 07/25/2022, 9:55 AM

## 2022-07-26 LAB — GLUCOSE, CAPILLARY
Glucose-Capillary: 133 mg/dL — ABNORMAL HIGH (ref 70–99)
Glucose-Capillary: 119 mg/dL — ABNORMAL HIGH (ref 70–99)
Glucose-Capillary: 108 mg/dL — ABNORMAL HIGH (ref 70–99)
Glucose-Capillary: 130 mg/dL — ABNORMAL HIGH (ref 70–99)

## 2022-07-26 NOTE — TOC Progression Note (Signed)
Transition of Care [facility]) - Progression Note    Patient Details  Name: Sierra Ford MRN: [id number] Date of Birth: 03-Sep-1940  Transition of Care [facility]) CM/SW Contact  Eduard Roux, [place] Phone Number: 07/26/2022, 1:19 PM  Clinical Narrative:     CSW met with patient at bedside. Provided SNF bed offers with Medicare.gov star ratings. Patient states she will review and informed CSW of her top two choices.   Antony Blackbird, MSW, LCSW Clinical Social Worker    Expected Discharge Plan: Skilled Nursing Facility Barriers to Discharge: Insurance Authorization, SNF Pending bed offer, Continued Medical Work up  Expected Discharge Plan and Services In-house Referral: Clinical Social Work                                             Social Determinants of Health (SDOH) Interventions SDOH Screenings   Food Insecurity: No Food Insecurity (07/24/2022)  Housing: Low Risk  (07/24/2022)  Transportation Needs: No Transportation Needs (07/24/2022)  Utilities: Not At Risk (07/24/2022)  Alcohol Screen: Low Risk  (08/15/2020)  Depression (PHQ2-9): Low Risk  (03/09/2022)  Financial Resource Strain: Medium Risk (03/20/2021)  Physical Activity: Sufficiently Active (08/15/2020)  Social Connections: Moderately Integrated (08/15/2020)  Stress: No Stress Concern Present (08/15/2020)  Tobacco Use: Medium Risk (07/24/2022)    Readmission Risk Interventions     No data to display

## 2022-07-26 NOTE — Progress Notes (Signed)
Physical Therapy Treatment Patient Details Name: Sierra Ford MRN: [id number] DOB: 1940/11/05 Today's Date: 07/26/2022   History of Present Illness The pt is an 82 yo female presenting 6/4 for lateral interbody fusion L3-5 with percutaneous pedicle screw placement due to lumbar stenosis and spondylolisthesis. PMH includes anxiety, depression, DMII, HLD, OSA, PE.    PT Comments    The pt was agreeable to session with focus on progressing LE strength and standing tolerance. She was able to complete repeated sit-stand transfers with both use of HHA from PT and later with use of stedy. The pt continues to require significant assist to complete sit-stand and pivot due to poor proprioception and strength in LLE. She was able to complete repeated attempts at static standing and progressed to standing wt shift within session, but does require seated rest between tasks. Recommendations remain appropriate, pt eager to progress with functional strength and OOB mobility.     Recommendations for follow up therapy are one component of a multi-disciplinary discharge planning process, led by the attending physician.  Recommendations may be updated based on patient status, additional functional criteria and insurance authorization.  Follow Up Recommendations       Assistance Recommended at Discharge Frequent or constant Supervision/Assistance  Patient can return home with the following A lot of help with walking and/or transfers;A lot of help with bathing/dressing/bathroom;Assistance with cooking/housework;Assist for transportation;Help with stairs or ramp for entrance   Equipment Recommendations  BSC/3in1    Recommendations for Other Services Rehab consult     Precautions / Restrictions Precautions Precautions: Fall;Back Precaution Booklet Issued: No Precaution Comments: verbally reviewed back precautions Required Braces or Orthoses:  (no brace needed) Restrictions Weight Bearing Restrictions:  No     Mobility  Bed Mobility Overal bed mobility: Needs Assistance Bed Mobility: Rolling, Sidelying to Sit Rolling: Min guard Sidelying to sit: Mod assist       General bed mobility comments: modA to elevate trunk to sitting and maintain initially, leans to R    Transfers Overall transfer level: Needs assistance Equipment used: Ambulation equipment used, 1 person hand held assist Transfers: Sit to/from Stand, Bed to chair/wheelchair/BSC Sit to Stand: Mod assist, From elevated surface, Min assist Stand pivot transfers: Mod assist         General transfer comment: modA to facilitate anterior wt shift and extension at knees and hips for stand, modA with front-facing HHA sit-stand. then mod-maxA to transfer with stand-pivot to [facility]. used stedy for remaining transfers with pt needing min-modA Transfer via Lift Equipment: Stedy  Ambulation/Gait             Pre-gait activities: standing wt shift in stedy General Gait Details: pt unable     Balance Overall balance assessment: Needs assistance Sitting-balance support: Single extremity supported, Bilateral upper extremity supported, Feet supported Sitting balance-Leahy Scale: Poor Sitting balance - Comments: minA-modA Postural control: Posterior lean, Right lateral lean Standing balance support: Bilateral upper extremity supported Standing balance-Leahy Scale: Poor Standing balance comment: min-modA                            Cognition Arousal/Alertness: Awake/alert Behavior During Therapy: WFL for tasks assessed/performed Overall Cognitive Status: Within Functional Limits for tasks assessed  Exercises Other Exercises Other Exercises: sit-stand from stedy flaps x4 Other Exercises: standing wt shift with modA to facilitate    General Comments General comments (skin integrity, edema, etc.): SpO2 to low of 89% on RA, replaced 1L O2      Pertinent  Vitals/Pain Pain Assessment Pain Assessment: Faces Faces Pain Scale: Hurts even more Pain Location: back Pain Descriptors / Indicators: Sore, Discomfort, Grimacing Pain Intervention(s): Limited activity within patient's tolerance, Monitored during session, Repositioned, Premedicated before session     PT Goals (current goals can now be found in the care plan section) Acute Rehab PT Goals Patient Stated Goal: to improve LLE strenght and reduce falls risk PT Goal Formulation: With patient Time For Goal Achievement: 08/08/22 Potential to Achieve Goals: Fair Progress towards PT goals: Progressing toward goals    Frequency    Min 5X/week      PT Plan Current plan remains appropriate       AM-PAC PT "6 Clicks" Mobility   Outcome Measure  Help needed turning from your back to your side while in a flat bed without using bedrails?: A Little Help needed moving from lying on your back to sitting on the side of a flat bed without using bedrails?: A Lot Help needed moving to and from a bed to a chair (including a wheelchair)?: A Lot Help needed standing up from a chair using your arms (e.g., wheelchair or bedside chair)?: A Lot Help needed to walk in hospital room?: Total Help needed climbing 3-5 steps with a railing? : Total 6 Click Score: 11    End of Session Equipment Utilized During Treatment: Gait belt Activity Tolerance: Patient tolerated treatment well Patient left: in chair;with call bell/phone within reach;with chair alarm set Nurse Communication: Mobility status PT Visit Diagnosis: Other abnormalities of gait and mobility (R26.89);Muscle weakness (generalized) (M62.81);Pain     Time: 4098-1191 PT Time Calculation (min) (ACUTE ONLY): 41 min  Charges:  $Therapeutic Exercise: 23-37 mins $Therapeutic Activity: 8-22 mins                     Vickki Muff, PT, DPT   Acute Rehabilitation Department Office [phone] Secure Chat Communication Preferred   Ronnie Derby 07/26/2022, 11:59 AM

## 2022-07-27 ENCOUNTER — Telehealth: Payer: Self-pay | Admitting: Family Medicine

## 2022-07-27 LAB — GLUCOSE, CAPILLARY
Glucose-Capillary: 158 mg/dL — ABNORMAL HIGH (ref 70–99)
Glucose-Capillary: 96 mg/dL (ref 70–99)
Glucose-Capillary: 104 mg/dL — ABNORMAL HIGH (ref 70–99)
Glucose-Capillary: 131 mg/dL — ABNORMAL HIGH (ref 70–99)

## 2022-07-27 MED ORDER — ENOXAPARIN SODIUM 40 MG/0.4ML IJ SOSY: 40.0000 mg | PREFILLED_SYRINGE | INTRAMUSCULAR | Status: DC

## 2022-07-27 MED ORDER — ENOXAPARIN SODIUM 40 MG/0.4ML IJ SOSY
40.0000 mg | PREFILLED_SYRINGE | INTRAMUSCULAR | Status: AC
  Administered 2022-07-27 – 2022-07-30 (×4): 40 mg via SUBCUTANEOUS
  Filled 2022-07-27 (×4): qty 0.4

## 2022-07-27 NOTE — Care Management Important Message (Signed)
Important Message  Patient Details  Name: Sierra Ford MRN: [id number] Date of Birth: [birth year]   Medicare Important Message Given:  Yes     Sherilyn Banker 07/27/2022, 4:12 PM

## 2022-07-27 NOTE — Progress Notes (Signed)
RN tried weaning patient from 2L [place].    2325- Decreased to 1L [place].   0020- Patient started desating into high 28s. RN increased back to 2L [place]

## 2022-07-27 NOTE — Telephone Encounter (Signed)
Patient has called wanting to speak with Tammy about her REPATHA medication. She said she is currently in the hospital and they are charging her $211 but normally she only pays $9.95 with her grants. Please advise   Patient Cell: [phone]

## 2022-07-27 NOTE — Progress Notes (Signed)
Occupational Therapy Treatment Patient Details Name: Sierra Ford MRN: [id number] DOB: 09/11/1940 Today's Date: 07/27/2022   History of present illness The pt is an 82 yo female presenting 6/4 for lateral interbody fusion L3-5 with percutaneous pedicle screw placement due to lumbar stenosis and spondylolisthesis. PMH includes anxiety, depression, DMII, HLD, OSA, PE.   OT comments  Patient making great progress towards goals to date. Patient now able to feel her L knee, but decreased sensation remains. Able to complete sit<>stands with min A of 2 to come into standing (bed elevated) and able to take steps towards window (assist provided for weight shifting initially). Due to decreased sensation, patient remains limited in lower body ADLs. OT recommendation remains appropriate, will conitnue to follow.    Recommendations for follow up therapy are one component of a multi-disciplinary discharge planning process, led by the attending physician.  Recommendations may be updated based on patient status, additional functional criteria and insurance authorization.    Assistance Recommended at Discharge Frequent or constant Supervision/Assistance  Patient can return home with the following  Two people to help with walking and/or transfers;A lot of help with bathing/dressing/bathroom;Assistance with cooking/housework;Assist for transportation;Help with stairs or ramp for entrance   Equipment Recommendations  Other (comment) (defer to next venue)    Recommendations for Other Services      Precautions / Restrictions Precautions Precautions: Fall;Back Precaution Booklet Issued: No Precaution Comments: verbally reviewed back precautions Required Braces or Orthoses:  (no brace needed) Restrictions Weight Bearing Restrictions: No       Mobility Bed Mobility Overal bed mobility: Needs Assistance Bed Mobility: Rolling, Sidelying to Sit Rolling: Min guard Sidelying to sit: Mod assist, +2 for  physical assistance       General bed mobility comments: modA of 2 from flat bed to elevate trunk to sitting and maintain initially    Transfers Overall transfer level: Needs assistance Equipment used: Rolling walker (2 wheels) Transfers: Sit to/from Stand Sit to Stand: Mod assist, Min assist, +2 physical assistance           General transfer comment: modA initially, progressed to minA of 2. pt needing max cues for hip and knee extension as well as posture to come to full upright. repeated cues for positioning and posture with each attempt. completed x5 in session     Balance Overall balance assessment: Needs assistance Sitting-balance support: Single extremity supported, Bilateral upper extremity supported, Feet supported Sitting balance-Leahy Scale: Poor Sitting balance - Comments: minA-modA Postural control: Posterior lean, Right lateral lean Standing balance support: Bilateral upper extremity supported Standing balance-Leahy Scale: Poor Standing balance comment: min-modA                           ADL either performed or assessed with clinical judgement   ADL Overall ADL's : Needs assistance/impaired Eating/Feeding: Independent;Sitting Eating/Feeding Details (indicate cue type and reason): in recliner Grooming: Set up;Sitting           Upper Body Dressing : Minimal assistance;Sitting   Lower Body Dressing: Total assistance;Bed level;Sitting/lateral leans Lower Body Dressing Details (indicate cue type and reason): decreased balance and sensation in LLE to complete Toilet Transfer: Minimal assistance;+2 for safety/equipment;+2 for physical assistance;Ambulation Toilet Transfer Details (indicate cue type and reason): simulated with functional mobility Toileting- Clothing Manipulation and Hygiene: Moderate assistance;Sit to/from stand;Sitting/lateral lean Toileting - Clothing Manipulation Details (indicate cue type and reason): can wipe front peri area, but  not back while seated and cannot  stand and complete     Functional mobility during ADLs: Moderate assistance;+2 for safety/equipment;+2 for physical assistance;Cueing for safety;Cueing for sequencing;Rolling walker (2 wheels) General ADL Comments: Patient now able to feel her L knee, but decreased sensation remains. Able to complete sit<>stands with min A of 2 to come into standing (bed elevated) and able to take steps towards window (assist provided for weight shifting initially). OT recommendation remains appropriate, will conitnue to follow.    Extremity/Trunk Assessment              Vision       Perception     Praxis      Cognition Arousal/Alertness: Awake/alert Behavior During Therapy: WFL for tasks assessed/performed Overall Cognitive Status: Within Functional Limits for tasks assessed                                          Exercises Exercises: General Lower Extremity General Exercises - Lower Extremity Mini-Sqauts: Strengthening, Both, 10 reps, Standing    Shoulder Instructions       General Comments VSS on RA    Pertinent Vitals/ Pain       Pain Assessment Pain Assessment: Faces Faces Pain Scale: Hurts even more Pain Location: back Pain Descriptors / Indicators: Sore, Discomfort, Grimacing Pain Intervention(s): Limited activity within patient's tolerance, Monitored during session, Repositioned  Home Living                                          Prior Functioning/Environment              Frequency  Min 2X/week        Progress Toward Goals  OT Goals(current goals can now be found in the care plan section)  Progress towards OT goals: Progressing toward goals  Acute Rehab OT Goals Patient Stated Goal: to get to rehab OT Goal Formulation: With patient Time For Goal Achievement: 08/08/22 Potential to Achieve Goals: Good  Plan Discharge plan needs to be updated    Co-evaluation                  AM-PAC OT "6 Clicks" Daily Activity     Outcome Measure   Help from another person eating meals?: None Help from another person taking care of personal grooming?: A Little Help from another person toileting, which includes using toliet, bedpan, or urinal?: A Lot Help from another person bathing (including washing, rinsing, drying)?: A Lot Help from another person to put on and taking off regular upper body clothing?: A Lot Help from another person to put on and taking off regular lower body clothing?: Total 6 Click Score: 14    End of Session Equipment Utilized During Treatment: Gait belt;Rolling walker (2 wheels)  OT Visit Diagnosis: Unsteadiness on feet (R26.81);Other abnormalities of gait and mobility (R26.89);Muscle weakness (generalized) (M62.81);Pain   Activity Tolerance Patient tolerated treatment well   Patient Left in chair;with call bell/phone within reach;with chair alarm set   Nurse Communication Mobility status (stedy back to bed and to [facility])        Time: 1125-1200 OT Time Calculation (min): 35 min  Charges: OT General Charges $OT Visit: 1 Visit OT Treatments $Self Care/Home Management : 8-22 mins  Pollyann Glen E. Thailyn Khalid, OTR/L Acute Rehabilitation Services [phone]  Cherlyn Cushing 07/27/2022, 2:57 PM

## 2022-07-27 NOTE — Progress Notes (Signed)
Subjective: Patient reports improvement in her left leg strength and sensation  Objective: Vital signs in last 24 hours: Temp:  [98.6 F (37 C)-99.3 F (37.4 C)] 98.7 F (37.1 C) (06/07 0807) Pulse Rate:  [83-99] 99 (06/07 0807) Resp:  [16-18] 16 (06/07 0807) BP: (106-128)/(46-60) 106/46 (06/07 0807) SpO2:  [91 %-96 %] 93 % (06/07 0807)  Intake/Output from previous day: No intake/output data recorded. Intake/Output this shift: Total I/O In: 240 [P.O.:240] Out: -  NAD Incision dressings c/d Full strength distally 2-3/5 left quadriceps Improved gross touch sensation left lower thigh/knee  Lab Results: No results for input(s): "WBC", "HGB", "HCT", "PLT" in the last 72 hours. BMET No results for input(s): "NA", "K", "CL", "CO2", "GLUCOSE", "BUN", "CREATININE", "CALCIUM" in the last 72 hours.  Studies/Results: No results found.  Assessment/Plan: S/p L3-4, L4-5 DLIF with postoperative left lumbar plexopathy that appears to be improving fairly rapidly - cont PT/OT - as we do expect continued improvement in her neurologic function, I think she would be a good candidate for acute rehab if she can tolerate the therapies   Sierra Ford 07/27/2022, 12:08 PM

## 2022-07-27 NOTE — TOC Progression Note (Signed)
Transition of Care [facility]) - Progression Note    Patient Details  Name: Sierra Ford MRN: [id number] Date of Birth: [birth year]  [facility]) CM/SW Contact  Eduard Roux, [place] Phone Number: 07/27/2022, 9:06 PM  Clinical Narrative:     Patient received SNF([facility]) insurance authorization [id number] reference # [id number] 06/08-06/11.   TOC will continue to follow and assist with discharge planning.   **note** patient will need a script for any narcotics if prescribed to take to SNF once discharged.   Antony Blackbird, MSW, LCSW Clinical Social Worker    Expected Discharge Plan: Skilled Nursing Facility Barriers to Discharge: Continued Medical Work up  Expected Discharge Plan and Services In-house Referral: Clinical Social Work                                             Social Determinants of Health (SDOH) Interventions SDOH Screenings   Food Insecurity: No Food Insecurity (07/24/2022)  Housing: Low Risk  (07/24/2022)  Transportation Needs: No Transportation Needs (07/24/2022)  Utilities: Not At Risk (07/24/2022)  Alcohol Screen: Low Risk  (08/15/2020)  Depression (PHQ2-9): Low Risk  (03/09/2022)  Financial Resource Strain: Medium Risk (03/20/2021)  Physical Activity: Sufficiently Active (08/15/2020)  Social Connections: Moderately Integrated (08/15/2020)  Stress: No Stress Concern Present (08/15/2020)  Tobacco Use: Medium Risk (07/24/2022)    Readmission Risk Interventions     No data to display

## 2022-07-27 NOTE — Progress Notes (Signed)
Physical Therapy Treatment Patient Details Name: Sierra Ford MRN: [id number] DOB: 1940-04-02 Today's Date: 07/27/2022   History of Present Illness The pt is an 82 yo female presenting 6/4 for lateral interbody fusion L3-5 with percutaneous pedicle screw placement due to lumbar stenosis and spondylolisthesis. PMH includes anxiety, depression, DMII, HLD, OSA, PE.    PT Comments    The pt was eager to progress with mobility and strength training of LLE today. She was able to make great progress and was able to initiate her first steps with modA of 2 and use of RW this session. She was able to progress with sit-stand from modA of 2 to minA of 2 and then ambulated ~6 ft total. Pt continues to need max cues for positioning in RW, posture, hip and knee extension, and movement of RW with gait. The pt was able to progress from needing facilitate wt shift to left for R stepping and to advance LLE when stepping with L to modA of 2 for balance and movement of RW only. Continue to recommend intensive therapies.    Recommendations for follow up therapy are one component of a multi-disciplinary discharge planning process, led by the attending physician.  Recommendations may be updated based on patient status, additional functional criteria and insurance authorization.  Follow Up Recommendations       Assistance Recommended at Discharge Frequent or constant Supervision/Assistance  Patient can return home with the following A lot of help with walking and/or transfers;A lot of help with bathing/dressing/bathroom;Assistance with cooking/housework;Assist for transportation;Help with stairs or ramp for entrance   Equipment Recommendations  BSC/3in1    Recommendations for Other Services Rehab consult     Precautions / Restrictions Precautions Precautions: Fall;Back Precaution Booklet Issued: No Precaution Comments: verbally reviewed back precautions Required Braces or Orthoses:  (no brace  needed) Restrictions Weight Bearing Restrictions: No     Mobility  Bed Mobility Overal bed mobility: Needs Assistance Bed Mobility: Rolling, Sidelying to Sit Rolling: Min guard Sidelying to sit: Mod assist, +2 for physical assistance       General bed mobility comments: modA of 2 from flat bed to elevate trunk to sitting and maintain initially    Transfers Overall transfer level: Needs assistance Equipment used: Rolling walker (2 wheels) Transfers: Sit to/from Stand Sit to Stand: Mod assist, Min assist, +2 physical assistance           General transfer comment: modA initially, progressed to minA of 2. pt needing max cues for hip and knee extension as well as posture to come to full upright. repeated cues for positioning and posture with each attempt. completed x5 in session    Ambulation/Gait Ambulation/Gait assistance: Max assist, Mod assist, +2 physical assistance Gait Distance (Feet): 6 Feet Assistive device: Rolling walker (2 wheels) Gait Pattern/deviations: Step-through pattern, Decreased step length - left, Decreased stance time - left, Decreased stride length, Decreased weight shift to left Gait velocity: decreased Gait velocity interpretation: <1.31 ft/sec, indicative of household ambulator   General Gait Details: pt with trunk flexed and initially needing assist to facilitate wt shift to left for R stepping and to advance LLE when stepping with L. minimal DF bilaterally, but pt able to progress to advancing without assist. cues for posture and movement of RW repeated in session     Balance Overall balance assessment: Needs assistance Sitting-balance support: Single extremity supported, Bilateral upper extremity supported, Feet supported Sitting balance-Leahy Scale: Poor Sitting balance - Comments: minA-modA Postural control: Posterior lean, Right lateral  lean Standing balance support: Bilateral upper extremity supported Standing balance-Leahy Scale:  Poor Standing balance comment: min-modA                            Cognition Arousal/Alertness: Awake/alert Behavior During Therapy: WFL for tasks assessed/performed Overall Cognitive Status: Within Functional Limits for tasks assessed                                          Exercises General Exercises - Lower Extremity Mini-Sqauts: Strengthening, Both, 10 reps, Standing    General Comments General comments (skin integrity, edema, etc.): VSS on RA      Pertinent Vitals/Pain Pain Assessment Pain Assessment: Faces Faces Pain Scale: Hurts even more Pain Location: back Pain Descriptors / Indicators: Sore, Discomfort, Grimacing Pain Intervention(s): Limited activity within patient's tolerance, Monitored during session, Repositioned     PT Goals (current goals can now be found in the care plan section) Acute Rehab PT Goals Patient Stated Goal: to improve LLE strength and reduce falls risk PT Goal Formulation: With patient Time For Goal Achievement: 08/08/22 Potential to Achieve Goals: Fair Progress towards PT goals: Progressing toward goals    Frequency    Min 5X/week      PT Plan Current plan remains appropriate       AM-PAC PT "6 Clicks" Mobility   Outcome Measure  Help needed turning from your back to your side while in a flat bed without using bedrails?: A Little Help needed moving from lying on your back to sitting on the side of a flat bed without using bedrails?: A Lot Help needed moving to and from a bed to a chair (including a wheelchair)?: A Lot Help needed standing up from a chair using your arms (e.g., wheelchair or bedside chair)?: A Lot Help needed to walk in hospital room?: Total Help needed climbing 3-5 steps with a railing? : Total 6 Click Score: 11    End of Session Equipment Utilized During Treatment: Gait belt Activity Tolerance: Patient tolerated treatment well Patient left: in chair;with call bell/phone within  reach;with chair alarm set Nurse Communication: Mobility status PT Visit Diagnosis: Other abnormalities of gait and mobility (R26.89);Muscle weakness (generalized) (M62.81);Pain     Time: 1125-1200 PT Time Calculation (min) (ACUTE ONLY): 35 min  Charges:  $Gait Training: 8-22 mins                     Vickki Muff, PT, DPT   Acute Rehabilitation Department Office [phone] Secure Chat Communication Preferred   Ronnie Derby 07/27/2022, 1:56 PM

## 2022-07-27 NOTE — TOC Progression Note (Signed)
Transition of Care [facility]) - Progression Note    Patient Details  Name: Sierra Ford MRN: [id number] Date of Birth: 12-18-1940  [facility]) CM/SW Contact  Eduard Roux, [place] Phone Number: 07/27/2022, 4:06 PM  Clinical Narrative:     CSW met with patient at bedside. Patient has decided on [facility] SNF. CSW will start insurance authorization.   TOC will continue to follow and assist with discharge planning.  Antony Blackbird, MSW, LCSW Clinical Social Worker    Expected Discharge Plan: Skilled Nursing Facility Barriers to Discharge: Insurance Authorization, SNF Pending bed offer, Continued Medical Work up  Expected Discharge Plan and Services In-house Referral: Clinical Social Work                                             Social Determinants of Health (SDOH) Interventions SDOH Screenings   Food Insecurity: No Food Insecurity (07/24/2022)  Housing: Low Risk  (07/24/2022)  Transportation Needs: No Transportation Needs (07/24/2022)  Utilities: Not At Risk (07/24/2022)  Alcohol Screen: Low Risk  (08/15/2020)  Depression (PHQ2-9): Low Risk  (03/09/2022)  Financial Resource Strain: Medium Risk (03/20/2021)  Physical Activity: Sufficiently Active (08/15/2020)  Social Connections: Moderately Integrated (08/15/2020)  Stress: No Stress Concern Present (08/15/2020)  Tobacco Use: Medium Risk (07/24/2022)    Readmission Risk Interventions     No data to display

## 2022-07-27 NOTE — Plan of Care (Signed)
  Problem: Education: Goal: Knowledge of General Education information will improve Description: Including pain rating scale, medication(s)/side effects and non-pharmacologic comfort measures Outcome: Progressing   Problem: Health Behavior/Discharge Planning: Goal: Ability to manage health-related needs will improve Outcome: Progressing   Problem: Clinical Measurements: Goal: Ability to maintain clinical measurements within normal limits will improve Outcome: Progressing Goal: Will remain free from infection Outcome: Progressing Goal: Diagnostic test results will improve Outcome: Progressing Goal: Respiratory complications will improve Outcome: Progressing Goal: Cardiovascular complication will be avoided Outcome: Progressing   Problem: Activity: Goal: Risk for activity intolerance will decrease Outcome: Progressing   Problem: Nutrition: Goal: Adequate nutrition will be maintained Outcome: Progressing   Problem: Coping: Goal: Level of anxiety will decrease Outcome: Progressing   Problem: Elimination: Goal: Will not experience complications related to bowel motility Outcome: Progressing Goal: Will not experience complications related to urinary retention Outcome: Progressing   Problem: Pain Managment: Goal: General experience of comfort will improve Outcome: Progressing   Problem: Safety: Goal: Ability to remain free from injury will improve Outcome: Progressing   Problem: Skin Integrity: Goal: Risk for impaired skin integrity will decrease Outcome: Progressing   Problem: Education: Goal: Ability to verbalize activity precautions or restrictions will improve Outcome: Progressing Goal: Knowledge of the prescribed therapeutic regimen will improve Outcome: Progressing Goal: Understanding of discharge needs will improve Outcome: Progressing   Problem: Activity: Goal: Ability to avoid complications of mobility impairment will improve Outcome: Progressing Goal:  Ability to tolerate increased activity will improve Outcome: Progressing Goal: Will remain free from falls Outcome: Progressing   Problem: Bowel/Gastric: Goal: Gastrointestinal status for postoperative course will improve Outcome: Progressing   Problem: Clinical Measurements: Goal: Ability to maintain clinical measurements within normal limits will improve Outcome: Progressing Goal: Postoperative complications will be avoided or minimized Outcome: Progressing Goal: Diagnostic test results will improve Outcome: Progressing   Problem: Pain Management: Goal: Pain level will decrease Outcome: Progressing   Problem: Skin Integrity: Goal: Will show signs of wound healing Outcome: Progressing   Problem: Health Behavior/Discharge Planning: Goal: Identification of resources available to assist in meeting health care needs will improve Outcome: Progressing   Problem: Bladder/Genitourinary: Goal: Urinary functional status for postoperative course will improve Outcome: Progressing   Problem: Education: Goal: Ability to describe self-care measures that may prevent or decrease complications (Diabetes Survival Skills Education) will improve Outcome: Progressing Goal: Individualized Educational Video(s) Outcome: Progressing   Problem: Coping: Goal: Ability to adjust to condition or change in health will improve Outcome: Progressing   Problem: Fluid Volume: Goal: Ability to maintain a balanced intake and output will improve Outcome: Progressing   Problem: Health Behavior/Discharge Planning: Goal: Ability to identify and utilize available resources and services will improve Outcome: Progressing Goal: Ability to manage health-related needs will improve Outcome: Progressing   Problem: Metabolic: Goal: Ability to maintain appropriate glucose levels will improve Outcome: Progressing   Problem: Nutritional: Goal: Maintenance of adequate nutrition will improve Outcome:  Progressing Goal: Progress toward achieving an optimal weight will improve Outcome: Progressing   Problem: Skin Integrity: Goal: Risk for impaired skin integrity will decrease Outcome: Progressing   Problem: Tissue Perfusion: Goal: Adequacy of tissue perfusion will improve Outcome: Progressing   

## 2022-07-28 LAB — GLUCOSE, CAPILLARY
Glucose-Capillary: 118 mg/dL — ABNORMAL HIGH (ref 70–99)
Glucose-Capillary: 134 mg/dL — ABNORMAL HIGH (ref 70–99)
Glucose-Capillary: 145 mg/dL — ABNORMAL HIGH (ref 70–99)
Glucose-Capillary: 145 mg/dL — ABNORMAL HIGH (ref 70–99)

## 2022-07-28 NOTE — Progress Notes (Signed)
2300- patient started desating while sleeping. RN placed 2L  on patient. Patient told this RN she does wear a CPAP at night for her sleep apnea.

## 2022-07-28 NOTE — Progress Notes (Signed)
Patient has tried to have multiple bowel movements but unable to go. Patient is now requesting an enema. Will pass on to oncoming nurse.

## 2022-07-28 NOTE — Progress Notes (Signed)
   Providing Compassionate, Quality Care - Together  NEUROSURGERY PROGRESS NOTE   S: No issues overnight.  No bowel movement, left lower extremity remains stable  O: EXAM:  BP 137/70 (BP Location: Left Arm)   Pulse 79   Temp 99.1 F (37.3 C) (Oral)   Resp 18   Ht 5\' 2"  (1.575 m)   Wt 74.8 kg   SpO2 97%   BMI 30.18 kg/m   Awake, alert, oriented x 3 PERRL Speech fluent, appropriate  CNs grossly intact  5/5 BUE/BLE except left hip flexor 2/5, knee extensors 3/5 Decrease sensation to light touch along the anterior thigh on the left Abdomen soft, nontender  ASSESSMENT:  82 y.o. 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  Please do not hesitate to call with questions or concerns.   Monia Pouch, DO Neurosurgeon [facility] Cell: [phone]

## 2022-07-28 NOTE — Plan of Care (Signed)
  Problem: Education: Goal: Knowledge of General Education information will improve Description: Including pain rating scale, medication(s)/side effects and non-pharmacologic comfort measures Outcome: Progressing   Problem: Health Behavior/Discharge Planning: Goal: Ability to manage health-related needs will improve Outcome: Progressing   Problem: Clinical Measurements: Goal: Ability to maintain clinical measurements within normal limits will improve Outcome: Progressing Goal: Will remain free from infection Outcome: Progressing Goal: Diagnostic test results will improve Outcome: Progressing Goal: Respiratory complications will improve Outcome: Progressing Goal: Cardiovascular complication will be avoided Outcome: Progressing   Problem: Activity: Goal: Risk for activity intolerance will decrease Outcome: Progressing   Problem: Nutrition: Goal: Adequate nutrition will be maintained Outcome: Progressing   Problem: Coping: Goal: Level of anxiety will decrease Outcome: Progressing   Problem: Elimination: Goal: Will not experience complications related to bowel motility Outcome: Progressing Goal: Will not experience complications related to urinary retention Outcome: Progressing   Problem: Pain Managment: Goal: General experience of comfort will improve Outcome: Progressing   Problem: Safety: Goal: Ability to remain free from injury will improve Outcome: Progressing   Problem: Skin Integrity: Goal: Risk for impaired skin integrity will decrease Outcome: Progressing   Problem: Education: Goal: Ability to verbalize activity precautions or restrictions will improve Outcome: Progressing Goal: Knowledge of the prescribed therapeutic regimen will improve Outcome: Progressing Goal: Understanding of discharge needs will improve Outcome: Progressing   Problem: Activity: Goal: Ability to avoid complications of mobility impairment will improve Outcome: Progressing Goal:  Ability to tolerate increased activity will improve Outcome: Progressing Goal: Will remain free from falls Outcome: Progressing   Problem: Bowel/Gastric: Goal: Gastrointestinal status for postoperative course will improve Outcome: Progressing   Problem: Clinical Measurements: Goal: Ability to maintain clinical measurements within normal limits will improve Outcome: Progressing Goal: Postoperative complications will be avoided or minimized Outcome: Progressing Goal: Diagnostic test results will improve Outcome: Progressing   Problem: Pain Management: Goal: Pain level will decrease Outcome: Progressing   Problem: Skin Integrity: Goal: Will show signs of wound healing Outcome: Progressing   Problem: Health Behavior/Discharge Planning: Goal: Identification of resources available to assist in meeting health care needs will improve Outcome: Progressing   Problem: Bladder/Genitourinary: Goal: Urinary functional status for postoperative course will improve Outcome: Progressing   Problem: Education: Goal: Ability to describe self-care measures that may prevent or decrease complications (Diabetes Survival Skills Education) will improve Outcome: Progressing Goal: Individualized Educational Video(s) Outcome: Progressing   Problem: Coping: Goal: Ability to adjust to condition or change in health will improve Outcome: Progressing   Problem: Fluid Volume: Goal: Ability to maintain a balanced intake and output will improve Outcome: Progressing   Problem: Health Behavior/Discharge Planning: Goal: Ability to identify and utilize available resources and services will improve Outcome: Progressing Goal: Ability to manage health-related needs will improve Outcome: Progressing   Problem: Metabolic: Goal: Ability to maintain appropriate glucose levels will improve Outcome: Progressing   Problem: Nutritional: Goal: Maintenance of adequate nutrition will improve Outcome:  Progressing Goal: Progress toward achieving an optimal weight will improve Outcome: Progressing   Problem: Skin Integrity: Goal: Risk for impaired skin integrity will decrease Outcome: Progressing   Problem: Tissue Perfusion: Goal: Adequacy of tissue perfusion will improve Outcome: Progressing   

## 2022-07-29 LAB — GLUCOSE, CAPILLARY
Glucose-Capillary: 139 mg/dL — ABNORMAL HIGH (ref 70–99)
Glucose-Capillary: 188 mg/dL — ABNORMAL HIGH (ref 70–99)
Glucose-Capillary: 189 mg/dL — ABNORMAL HIGH (ref 70–99)
Glucose-Capillary: 122 mg/dL — ABNORMAL HIGH (ref 70–99)

## 2022-07-29 NOTE — Plan of Care (Signed)
  Problem: Education: Goal: Knowledge of General Education information will improve Description: Including pain rating scale, medication(s)/side effects and non-pharmacologic comfort measures Outcome: Progressing   Problem: Health Behavior/Discharge Planning: Goal: Ability to manage health-related needs will improve Outcome: Progressing   Problem: Clinical Measurements: Goal: Ability to maintain clinical measurements within normal limits will improve Outcome: Progressing Goal: Will remain free from infection Outcome: Progressing Goal: Diagnostic test results will improve Outcome: Progressing Goal: Respiratory complications will improve Outcome: Progressing Goal: Cardiovascular complication will be avoided Outcome: Progressing   Problem: Activity: Goal: Risk for activity intolerance will decrease Outcome: Progressing   Problem: Nutrition: Goal: Adequate nutrition will be maintained Outcome: Progressing   Problem: Coping: Goal: Level of anxiety will decrease Outcome: Progressing   Problem: Elimination: Goal: Will not experience complications related to bowel motility Outcome: Progressing Goal: Will not experience complications related to urinary retention Outcome: Progressing   Problem: Pain Managment: Goal: General experience of comfort will improve Outcome: Progressing   Problem: Safety: Goal: Ability to remain free from injury will improve Outcome: Progressing   Problem: Skin Integrity: Goal: Risk for impaired skin integrity will decrease Outcome: Progressing   Problem: Education: Goal: Ability to verbalize activity precautions or restrictions will improve Outcome: Progressing Goal: Knowledge of the prescribed therapeutic regimen will improve Outcome: Progressing Goal: Understanding of discharge needs will improve Outcome: Progressing   Problem: Activity: Goal: Ability to avoid complications of mobility impairment will improve Outcome: Progressing Goal:  Ability to tolerate increased activity will improve Outcome: Progressing Goal: Will remain free from falls Outcome: Progressing   Problem: Bowel/Gastric: Goal: Gastrointestinal status for postoperative course will improve Outcome: Progressing   Problem: Clinical Measurements: Goal: Ability to maintain clinical measurements within normal limits will improve Outcome: Progressing Goal: Postoperative complications will be avoided or minimized Outcome: Progressing Goal: Diagnostic test results will improve Outcome: Progressing   Problem: Pain Management: Goal: Pain level will decrease Outcome: Progressing   Problem: Skin Integrity: Goal: Will show signs of wound healing Outcome: Progressing   Problem: Health Behavior/Discharge Planning: Goal: Identification of resources available to assist in meeting health care needs will improve Outcome: Progressing   Problem: Bladder/Genitourinary: Goal: Urinary functional status for postoperative course will improve Outcome: Progressing   Problem: Education: Goal: Ability to describe self-care measures that may prevent or decrease complications (Diabetes Survival Skills Education) will improve Outcome: Progressing Goal: Individualized Educational Video(s) Outcome: Progressing   Problem: Coping: Goal: Ability to adjust to condition or change in health will improve Outcome: Progressing   Problem: Fluid Volume: Goal: Ability to maintain a balanced intake and output will improve Outcome: Progressing   Problem: Health Behavior/Discharge Planning: Goal: Ability to identify and utilize available resources and services will improve Outcome: Progressing Goal: Ability to manage health-related needs will improve Outcome: Progressing   Problem: Metabolic: Goal: Ability to maintain appropriate glucose levels will improve Outcome: Progressing   Problem: Nutritional: Goal: Maintenance of adequate nutrition will improve Outcome:  Progressing Goal: Progress toward achieving an optimal weight will improve Outcome: Progressing   Problem: Skin Integrity: Goal: Risk for impaired skin integrity will decrease Outcome: Progressing   Problem: Tissue Perfusion: Goal: Adequacy of tissue perfusion will improve Outcome: Progressing   

## 2022-07-29 NOTE — Progress Notes (Signed)
   Providing Compassionate, Quality Care - Together  NEUROSURGERY PROGRESS NOTE   S: No issues overnight.  Left lower extremity remained stable  O: EXAM:  BP 135/81 (BP Location: Left Arm)   Pulse 90   Temp 98.8 F (37.1 C) (Oral)   Resp 17   Ht 5\' 2"  (1.575 m)   Wt 74.8 kg   SpO2 96%   BMI 30.18 kg/m   Awake, alert, oriented x 3 PERRL Speech fluent, appropriate  CNs grossly intact  5/5 BUE/BLE except left hip flexor 2/5, knee extensors 3/5 Decrease sensation to light touch along the anterior thigh on the left Abdomen soft, nontender   ASSESSMENT:  82 y.o. 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  Please do not hesitate to call with questions or concerns.   Monia Pouch, DO Neurosurgeon [facility] Cell: [phone]

## 2022-07-30 ENCOUNTER — Ambulatory Visit (INDEPENDENT_AMBULATORY_CARE_PROVIDER_SITE_OTHER): Payer: Self-pay | Admitting: Pharmacist

## 2022-07-30 DIAGNOSIS — E1165 Type 2 diabetes mellitus with hyperglycemia: Secondary | ICD-10-CM

## 2022-07-30 LAB — GLUCOSE, CAPILLARY
Glucose-Capillary: 107 mg/dL — ABNORMAL HIGH (ref 70–99)
Glucose-Capillary: 132 mg/dL — ABNORMAL HIGH (ref 70–99)
Glucose-Capillary: 128 mg/dL — ABNORMAL HIGH (ref 70–99)
Glucose-Capillary: 136 mg/dL — ABNORMAL HIGH (ref 70–99)

## 2022-07-30 MED ORDER — CHLORHEXIDINE GLUCONATE CLOTH 2 % EX PADS
6.0000 | MEDICATED_PAD | Freq: Every day | CUTANEOUS | Status: DC
  Administered 2022-07-30 – 2022-07-31 (×2): 6 via TOPICAL

## 2022-07-30 NOTE — Progress Notes (Signed)
Physical Therapy Treatment Patient Details Name: Sierra Ford MRN: [id number] DOB: 1940-06-27 Today's Date: 07/30/2022   History of Present Illness The pt is an 82 yo female presenting 6/4 for lateral interbody fusion L3-5 with percutaneous pedicle screw placement due to lumbar stenosis and spondylolisthesis. PMH includes anxiety, depression, DMII, HLD, OSA, PE.    PT Comments    Patient progressing with mobility walking a few steps in the room with +1 A.  She has difficulty progressing with L foot and needs more UE support when in stance on L.  She reports numbness along the thigh since surgery.  She was able to lift the leg while seated in chair so strength improving.  She will continue to benefit from skilled PT in the acute setting.   Noted plans to transfer to [facility] for rehab when stable.    Recommendations for follow up therapy are one component of a multi-disciplinary discharge planning process, led by the attending physician.  Recommendations may be updated based on patient status, additional functional criteria and insurance authorization.  Follow Up Recommendations  Can patient physically be transported by private vehicle: No    Assistance Recommended at Discharge Frequent or constant Supervision/Assistance  Patient can return home with the following A lot of help with walking and/or transfers;A lot of help with bathing/dressing/bathroom;Assistance with cooking/housework;Assist for transportation;Help with stairs or ramp for entrance   Equipment Recommendations  BSC/3in1    Recommendations for Other Services       Precautions / Restrictions Precautions Precautions: Fall;Back Precaution Comments: no brace needed     Mobility  Bed Mobility Overal bed mobility: Needs Assistance Bed Mobility: Rolling, Sidelying to Sit Rolling: Mod assist Sidelying to sit: Mod assist       General bed mobility comments: help to flex L knee and to turn hips to roll, assist for  legs off bed and pt pulling up to sit; able to scoot to EOB with S    Transfers Overall transfer level: Needs assistance Equipment used: Rolling walker (2 wheels) Transfers: Sit to/from Stand Sit to Stand: Mod assist           General transfer comment: lifting help to stand from higher surface    Ambulation/Gait Ambulation/Gait assistance: Mod assist Gait Distance (Feet): 6 Feet Assistive device: Rolling walker (2 wheels) Gait Pattern/deviations: Step-to pattern, Decreased stride length, Decreased step length - left, Shuffle, Trunk flexed       General Gait Details: cues for posture, assist with walker and for stability during stance on L and pt dragging L foot to progress, chair brought behind her after ambulating forward from EOB   Stairs             Wheelchair Mobility    Modified Rankin (Stroke Patients Only)       Balance Overall balance assessment: Needs assistance   Sitting balance-Leahy Scale: Poor Sitting balance - Comments: sitting with UE support cues for balance without UE support and minguard to S provided for safety     Standing balance-Leahy Scale: Poor Standing balance comment: min-modA with UE support                            Cognition Arousal/Alertness: Awake/alert Behavior During Therapy: WFL for tasks assessed/performed Overall Cognitive Status: Within Functional Limits for tasks assessed  Exercises Other Exercises Other Exercises: seated LAQ on R in recliner    General Comments General comments (skin integrity, edema, etc.): VSS on RA; thought supposed to go to rehab at [facility] today, but once found out not going pt eager to participate      Pertinent Vitals/Pain Pain Assessment Faces Pain Scale: Hurts little more Pain Location: L hip/lower leg Pain Descriptors / Indicators: Sore, Discomfort, Grimacing Pain Intervention(s): Monitored during session,  Repositioned    Home Living                          Prior Function            PT Goals (current goals can now be found in the care plan section) Progress towards PT goals: Progressing toward goals    Frequency    Min 3X/week      PT Plan Discharge plan needs to be updated;Frequency needs to be updated    Co-evaluation              AM-PAC PT "6 Clicks" Mobility   Outcome Measure  Help needed turning from your back to your side while in a flat bed without using bedrails?: A Lot Help needed moving from lying on your back to sitting on the side of a flat bed without using bedrails?: A Lot Help needed moving to and from a bed to a chair (including a wheelchair)?: A Lot Help needed standing up from a chair using your arms (e.g., wheelchair or bedside chair)?: A Lot Help needed to walk in hospital room?: Total Help needed climbing 3-5 steps with a railing? : Total 6 Click Score: 10    End of Session Equipment Utilized During Treatment: Gait belt Activity Tolerance: Patient limited by fatigue Patient left: in chair;with call bell/phone within reach Nurse Communication: Need for lift equipment;Mobility status PT Visit Diagnosis: Other abnormalities of gait and mobility (R26.89);Muscle weakness (generalized) (M62.81);Pain     Time: 4782-9562 PT Time Calculation (min) (ACUTE ONLY): 27 min  Charges:  $Gait Training: 8-22 mins $Therapeutic Activity: 8-22 mins                     Sheran Lawless, PT Acute Rehabilitation Services Office:[phone] 07/30/2022    Elray Mcgregor 07/30/2022, 6:05 PM

## 2022-07-30 NOTE — Care Management Important Message (Signed)
Important Message  Patient Details  Name: Sierra Ford MRN: [id number] Date of Birth: [birth year]   Medicare Important Message Given:  Yes     Sherilyn Banker 07/30/2022, 2:49 PM

## 2022-07-30 NOTE — Telephone Encounter (Signed)
Patient needs assistance re-enrolling with [employer] Type 2 DM fund. Seen telephone visit notes from 07/30/2022

## 2022-07-30 NOTE — Progress Notes (Signed)
Pharmacy Note  07/30/2022 Name: Sierra Ford MRN: [id number] DOB: 02/23/1940  Subjective: Sierra Ford is a 82 y.o. year old female who is a primary care patient of Zola Button, Grayling Congress, DO. Clinical Pharmacist Practitioner referral was placed to assist with medication, diabetes and hyperlipidemia management.    Engaged with patient by telephone for  question regarding [employer]  today.  Type 2 DM:  Current medications: Ozempic 1mg  weekly on Tuesdays.  Cost on her [employer] insurance is $250 / 28 days. Has a copay card from [employer] which lowers by $150 per month. She has also been using [employer] to off set cost but funding ran out earlier this year. She was not eligible to re-enroll until today.   Patient is actually in the hospital today recovering from back surgery - has L3-4, L4-5 Direct Lumbar Interbody Fusion 07/24/2022 . She called from the hospital.   Patient has glucometer at home but only checks 2 or 3 times per month.  Starting weight prior to Ozempic = 193lbs.  Current weight = 165 lbs Total amount of weight loss = 28 lbs  Objective: Review of patient status, including review of consultants reports, laboratory and other test data, was performed as part of comprehensive evaluation and provision of chronic care management services.   Lab Results  Component Value Date   CREATININE 0.76 07/17/2022   CREATININE 0.67 03/22/2022   CREATININE 0.89 11/08/2021    Lab Results  Component Value Date   HGBA1C 6.3 (H) 07/17/2022       Component Value Date/Time   CHOL 188 03/22/2022 1123   CHOL 209 (H) 08/26/2018 1018   TRIG 97.0 03/22/2022 1123   HDL 119.60 03/22/2022 1123   HDL 101 08/26/2018 1018   CHOLHDL 2 03/22/2022 1123   VLDL 19.4 03/22/2022 1123   LDLCALC 49 03/22/2022 1123   LDLCALC 69 11/10/2019 1122   LDLDIRECT 95.9 10/06/2012 1248     Clinical ASCVD: Yes  The ASCVD Risk score (Arnett DK, et al., 2019) failed to calculate  for the following reasons:   The 2019 ASCVD risk score is only valid for ages 37 to 40    BP Readings from Last 3 Encounters:  07/30/22 (!) 121/58  07/17/22 (!) 132/54  05/22/22 (!) 142/90     No Known Allergies  Medications Reviewed Today     Reviewed by Henrene Pastor, RPH-CPP (Pharmacist) on 07/30/22 at 0909  Med List Status: <None>   Medication Order Taking? Sig Documenting Provider Last Dose Status Informant  apixaban (ELIQUIS) 2.5 MG TABS tablet [id number] Yes Take 1 tablet (2.5 mg total) by mouth 2 (two) times daily. Rollene Rotunda, MD Taking Active Self  blood glucose meter kit and supplies KIT [id number] Yes Dispense based on patient and insurance preference. Use up to four times daily as directed. (FOR ICD-9 250.00, 250.01). Donato Schultz, DO Taking Active Self  Calcium Carb-Cholecalciferol (CALCIUM 600 + D PO) [id number] Yes Take 1 tablet by mouth daily. [provider] Taking Active Self  cholecalciferol (VITAMIN D3) 25 MCG (1000 UNIT) tablet [id number] Yes Take 1,000 Units by mouth 2 (two) times daily. [provider] Taking Active Self  Coenzyme Q10 100 MG TABS [id number] Yes Take 100 mg by mouth daily. [provider] Taking Active Self  diphenhydrAMINE HCl, Sleep, (ZZZQUIL) 25 MG CAPS [id number]  Take 25 mg by mouth at bedtime as needed (sleep). [provider]  Active Self  Evolocumab (REPATHA SURECLICK)  140 MG/ML SOAJ [id number] Yes Inject 140 mg into the skin every 14 (fourteen) days. Rollene Rotunda, MD Taking Active Self  Flaxseed, Linseed, (FLAXSEED OIL) 1200 MG CAPS [id number] Yes Take 1,200 mg by mouth daily. [provider] Taking Active Self  furosemide (LASIX) 40 MG tablet [id number] Yes TAKE 1 TABLET DAILY  Patient taking differently: Take 40 mg by mouth daily as needed for edema.   Rollene Rotunda, MD Taking Active Self           Med Note Ernest Mallick, MINDY L   Wed Nov 08, 2021  9:21 AM)    gabapentin (NEURONTIN) 300 MG  capsule [id number] Yes Take 300 mg by mouth at bedtime as needed (pain/sleep). [provider] Taking Active Self  Insulin Pen Needle (NOVOFINE PLUS PEN NEEDLE) 32G X 4 MM MISC [id number] Yes As directed Donato Schultz, DO Taking Active Self  methocarbamol (ROBAXIN-750) 750 MG tablet [id number] Yes Take 1 tablet (750 mg total) by mouth 4 (four) times daily.  Patient taking differently: Take 750 mg by mouth 4 (four) times daily as needed for muscle spasms.   Donato Schultz, DO Taking Active Self  Multiple Vitamin (MULTIVITAMIN) capsule [id number] Yes Take 1 capsule by mouth daily with breakfast. [provider] Taking Active Self    Discontinued 05/07/11 1124 (Discontinued by provider) Omega-3 Fatty Acids (FISH OIL) 1000 MG CAPS [id number] Yes Take 1,000 mg by mouth daily. [provider] Taking Active Self  OVER THE COUNTER MEDICATION [id number] Yes Apply 1 Application topically at bedtime. Nervive cream, apply to hands [provider] Taking Active Self  potassium chloride SA (KLOR-CON M) 20 MEQ tablet [id number] Yes Take 1 tablet (20 mEq total) by mouth daily.  Patient taking differently: Take 20 mEq by mouth daily as needed (when taking lasix).   Sharlene Dory, NP Taking Active Self    Discontinued 05/07/11 1124 (Change in therapy) rosuvastatin (CRESTOR) 10 MG tablet [id number] Yes Take 1 tablet (10 mg total) by mouth daily. Rollene Rotunda, MD Taking Active Self  Semaglutide, 1 MG/DOSE, 4 MG/3ML SOPN [id number] Yes Inject 1 mg as directed once a week. Donato Schultz, DO Taking Active Self           Med Note Joella Prince A   Wed Jul 11, 2022  1:15 PM) Tuesdays   traMADol (ULTRAM) 50 MG tablet [id number] Yes TAKE 1 TABLET BY MOUTH EVERY 8 HOURS AS NEEDED FOR UP TO 5 DAYS Donato Schultz, DO Taking Active Self  TRUE METRIX BLOOD GLUCOSE TEST test strip [id number] Yes TEST  UP  TO FOUR TIMES DAILY AS DIRECTED Donato Schultz, DO Taking  Active Self  TRUEPLUS LANCETS 33G MISC [id number] Yes TEST  UP TO FOUR TIMES DAILY AS DIRECTED Donato Schultz, DO Taking Active Self            Patient Active Problem List   Diagnosis Date Noted   Lumbar stenosis 07/24/2022   Other spondylosis with radiculopathy, lumbar region 07/24/2022   Chronic pain of both shoulders 05/24/2022   Pain of lower extremity 05/02/2022   Uncontrolled type 2 diabetes mellitus with hyperglycemia (HCC) 03/09/2022   Acute left-sided low back pain with sciatica 03/09/2022   Former smoker 03/09/2022   Primary osteoarthritis of left hip 02/20/2022   Spinal stenosis of lumbar region 02/20/2022   Rotator cuff arthropathy of both shoulders 02/20/2022   Lower abdominal pain 04/04/2021   Rectal bleeding 04/04/2021   Change  in bowel habits 04/04/2021   Internal hemorrhoids 04/04/2021   Preventative health care 03/07/2021   Sacroiliitis (HCC) 03/07/2021   Morbid (severe) obesity due to excess calories (HCC) 03/07/2021   Bronchitis 12/20/2020   Other pulmonary embolism with acute cor pulmonale, unspecified chronicity (HCC)    Educated about COVID-19 virus infection 01/26/2019   Hyperlipidemia associated with type 2 diabetes mellitus (HCC) 04/24/2018   Right ankle swelling 05/13/2017   Diabetes mellitus, type II (HCC) 04/08/2017   Primary hypertension 04/08/2017   Atherosclerosis of aorta (HCC) 04/08/2017   Abnormal auditory perception of both ears 10/01/2016   Bilateral impacted cerumen 10/01/2016   OSA (obstructive sleep apnea) 12/16/2015   DOE (dyspnea on exertion) 06/12/2013   Obesity (BMI 30-39.9) 04/24/2013   Chest pain, unspecified 01/22/2011   Abnormal stress test 01/22/2011   Frequent urination 01/22/2011   Chronic diastolic heart failure (HCC) 05/15/2009   CAROTID ARTERY DISEASE 01/12/2009   LEG EDEMA, RIGHT 07/27/2008   HX, URINARY INFECTION 09/19/2006   Hyperlipidemia LDL goal <70 08/20/2006   PREGNANCY, ECTOPIC NEC W/INTRAUTERINE PRG  08/20/2006   FREQUENCY, URINARY 08/20/2006   Other specified abnormal findings of blood chemistry 08/20/2006   Personal history of venous thrombosis and embolism 08/20/2006     Medication Assistance:   Patient has [employer] coverage for prescriptions. She is able to use discount coupon for Ozempic but only lowers cost from $250 to $100 per 28 days supply. Unfortunately she is not able to re-enroll in [facility] for type 2 DM because fund is closed.     Assessment / Plan Type 2 DM / obesity: A1c at goal; has lost 28 lbs since starting Ozempic and last A1c was at goal.  Continue Ozempic 1mg  weekly if able (patient has 1 pen at home). We will supply #1 sample but patient is aware she will need to inject 0.5mg  twice to get same dose.  Reviewed blood glucose goals:   Fasting 80 to 130   Within 2 hours of eating less than 180  Formulary alternatives to Ozempic: Rybelsus, Mounjaro and Trulicity are also $250 per month. Their savings card are all similar to Ozempic - max discount is $150 per 30 days so will lower cost to about $100 / month.     Follow Up:  by phone in 4 to 6 weeks   Henrene Pastor, PharmD Clinical Pharmacist [facility]  - [facility] [phone]

## 2022-07-30 NOTE — Progress Notes (Signed)
Patient complaining of pain in lower abdomen. Abdomen distended.   Bladder scanned on 07/29/2022 at 0730. Bladder scan volume- 945. Intermittent cath- 1100  Bladder scanned on 07/29/2022 at 1952. Bladder scan volume- 923. Intermittent Cath- 1050.  Bladder scanned at 0305- 571.  Received verbal orders from Hildred Priest, NP to place foley if patient was still retaining fluids. Foley inserted.

## 2022-07-30 NOTE — Progress Notes (Addendum)
S: Pt notes some urinary retention. Left lower extremity remains stable   O: EXAM:  BP (!) 121/58 (BP Location: Right Arm)   Pulse 81   Temp 98.9 F (37.2 C) (Oral)   Resp 16   Ht 5\' 2"  (1.575 m)   Wt 74.8 kg   SpO2 96%   BMI 30.18 kg/m    Awake, alert, oriented x 3 Speech fluent, appropriate  5/5 BUE/BLE except left hip flexor 2/5, knee extensors 3/5 Decrease sensation to light touch along the anterior thigh on the left Abdomen soft, nontender Dressings c/d/i   ASSESSMENT:  82 y.o. 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 Pt prefers SNF vs dc to home.  -Bowel regimen -Attempt voiding trial tmrw AM.   Patrici Ranks, PAC

## 2022-07-30 NOTE — TOC Progression Note (Signed)
Transition of Care [facility]) - Progression Note    Patient Details  Name: Sierra Ford MRN: [id number] Date of Birth: 04/02/1940  [facility]) CM/SW Contact  Eduard Roux, [place] Phone Number: 07/30/2022, 9:49 AM  Clinical Narrative:     CSW met with patient at bedside today. CSW informed patient, authorization was approved for SNF.   [facility] confirmed they can admit today.   RN updated   Antony Blackbird, MSW, LCSW Clinical Social Worker    Expected Discharge Plan: Skilled Nursing Facility Barriers to Discharge: Continued Medical Work up  Expected Discharge Plan and Services In-house Referral: Clinical Social Work                                             Social Determinants of Health (SDOH) Interventions SDOH Screenings   Food Insecurity: No Food Insecurity (07/24/2022)  Housing: Low Risk  (07/24/2022)  Transportation Needs: No Transportation Needs (07/24/2022)  Utilities: Not At Risk (07/24/2022)  Alcohol Screen: Low Risk  (08/15/2020)  Depression (PHQ2-9): Low Risk  (03/09/2022)  Financial Resource Strain: Medium Risk (03/20/2021)  Physical Activity: Sufficiently Active (08/15/2020)  Social Connections: Moderately Integrated (08/15/2020)  Stress: No Stress Concern Present (08/15/2020)  Tobacco Use: Medium Risk (07/24/2022)    Readmission Risk Interventions     No data to display

## 2022-07-31 DIAGNOSIS — M4316 Spondylolisthesis, lumbar region: Secondary | ICD-10-CM | POA: Diagnosis not present

## 2022-07-31 DIAGNOSIS — R531 Weakness: Secondary | ICD-10-CM | POA: Diagnosis not present

## 2022-07-31 DIAGNOSIS — M6281 Muscle weakness (generalized): Secondary | ICD-10-CM | POA: Diagnosis not present

## 2022-07-31 DIAGNOSIS — Z960 Presence of urogenital implants: Secondary | ICD-10-CM | POA: Diagnosis not present

## 2022-07-31 DIAGNOSIS — M1611 Unilateral primary osteoarthritis, right hip: Secondary | ICD-10-CM | POA: Diagnosis not present

## 2022-07-31 DIAGNOSIS — I10 Essential (primary) hypertension: Secondary | ICD-10-CM | POA: Diagnosis not present

## 2022-07-31 DIAGNOSIS — M48061 Spinal stenosis, lumbar region without neurogenic claudication: Secondary | ICD-10-CM | POA: Diagnosis not present

## 2022-07-31 DIAGNOSIS — E1165 Type 2 diabetes mellitus with hyperglycemia: Secondary | ICD-10-CM | POA: Diagnosis not present

## 2022-07-31 DIAGNOSIS — M549 Dorsalgia, unspecified: Secondary | ICD-10-CM | POA: Diagnosis not present

## 2022-07-31 DIAGNOSIS — R2243 Localized swelling, mass and lump, lower limb, bilateral: Secondary | ICD-10-CM | POA: Diagnosis not present

## 2022-07-31 DIAGNOSIS — R2689 Other abnormalities of gait and mobility: Secondary | ICD-10-CM | POA: Diagnosis not present

## 2022-07-31 DIAGNOSIS — M169 Osteoarthritis of hip, unspecified: Secondary | ICD-10-CM | POA: Diagnosis not present

## 2022-07-31 DIAGNOSIS — N390 Urinary tract infection, site not specified: Secondary | ICD-10-CM | POA: Diagnosis not present

## 2022-07-31 DIAGNOSIS — I700 Atherosclerosis of aorta: Secondary | ICD-10-CM | POA: Diagnosis not present

## 2022-07-31 DIAGNOSIS — Z7401 Bed confinement status: Secondary | ICD-10-CM | POA: Diagnosis not present

## 2022-07-31 DIAGNOSIS — Z87440 Personal history of urinary (tract) infections: Secondary | ICD-10-CM | POA: Diagnosis not present

## 2022-07-31 DIAGNOSIS — R278 Other lack of coordination: Secondary | ICD-10-CM | POA: Diagnosis not present

## 2022-07-31 DIAGNOSIS — R52 Pain, unspecified: Secondary | ICD-10-CM | POA: Diagnosis not present

## 2022-07-31 DIAGNOSIS — R338 Other retention of urine: Secondary | ICD-10-CM | POA: Diagnosis not present

## 2022-07-31 DIAGNOSIS — B964 Proteus (mirabilis) (morganii) as the cause of diseases classified elsewhere: Secondary | ICD-10-CM | POA: Diagnosis not present

## 2022-07-31 DIAGNOSIS — I5040 Unspecified combined systolic (congestive) and diastolic (congestive) heart failure: Secondary | ICD-10-CM | POA: Diagnosis not present

## 2022-07-31 DIAGNOSIS — I509 Heart failure, unspecified: Secondary | ICD-10-CM | POA: Diagnosis not present

## 2022-07-31 LAB — GLUCOSE, CAPILLARY
Glucose-Capillary: 129 mg/dL — ABNORMAL HIGH (ref 70–99)
Glucose-Capillary: 111 mg/dL — ABNORMAL HIGH (ref 70–99)

## 2022-07-31 MED ORDER — OXYCODONE HCL 5 MG PO TABS: 5.0000 mg | ORAL_TABLET | Freq: Four times a day (QID) | ORAL | 0 refills | Status: AC | PRN

## 2022-07-31 NOTE — Progress Notes (Addendum)
Occupational Therapy Treatment Patient Details Name: [facility] MRN: [id number] DOB: Mar 09, 1940 Today's Date: 07/31/2022   History of present illness The pt is an 82 yo female presenting 6/4 for lateral interbody fusion L3-5 with percutaneous pedicle screw placement due to lumbar stenosis and spondylolisthesis. PMH includes anxiety, depression, DMII, HLD, OSA, PE.   OT comments  Pt supine in bed and agreeable to OT.  Completes bed mobility with mod assist, transfers into standing with mod assist and takes several steps to recliner with mod assist.  Increased time required and cueing for safety. Continues to require total assist for LB dressing.  Remains limited by pain, decreased activity tolerance, impaired balance and generalized weakness.  DC plan at inpatient setting with <3hrs/day remains appropriate.  Will follow.    Recommendations for follow up therapy are one component of a multi-disciplinary discharge planning process, led by the attending physician.  Recommendations may be updated based on patient status, additional functional criteria and insurance authorization.    Assistance Recommended at Discharge Frequent or constant Supervision/Assistance  Patient can return home with the following  A lot of help with bathing/dressing/bathroom;Assistance with cooking/housework;Assist for transportation;Help with stairs or ramp for entrance;A lot of help with walking and/or transfers   Equipment Recommendations  Other (comment) (defer)    Recommendations for Other Services      Precautions / Restrictions Precautions Precautions: Fall;Back Precaution Booklet Issued: No Precaution Comments: no brace needed Restrictions Weight Bearing Restrictions: No       Mobility Bed Mobility Overal bed mobility: Needs Assistance Bed Mobility: Rolling, Sidelying to Sit Rolling: Mod assist Sidelying to sit: Mod assist       General bed mobility comments: help to flex L knee and to turn  hips to roll, assist for legs off bed and pt pulling up to sit; able to scoot to EOB with S    Transfers Overall transfer level: Needs assistance Equipment used: Rolling walker (2 wheels) Transfers: Sit to/from Stand Sit to Stand: Mod assist           General transfer comment: mod assist to power up from EOB cueing cueing for hand placement and safety     Balance Overall balance assessment: Needs assistance Sitting-balance support: Feet supported, Bilateral upper extremity supported, Single extremity supported Sitting balance-Leahy Scale: Fair Sitting balance - Comments: supervision at EOB, but preference to UE support   Standing balance support: Bilateral upper extremity supported, During functional activity Standing balance-Leahy Scale: Poor Standing balance comment: relies on BUE and external support                           ADL either performed or assessed with clinical judgement   ADL Overall ADL's : Needs assistance/impaired     Grooming: Set up;Sitting Grooming Details (indicate cue type and reason): recliner             Lower Body Dressing: Total assistance;Sit to/from stand Lower Body Dressing Details (indicate cue type and reason): relies on BUE support in standing Toilet Transfer: Moderate assistance;Ambulation;Rolling walker (2 wheels) Toilet Transfer Details (indicate cue type and reason): simulated to recliner         Functional mobility during ADLs: Moderate assistance;Rolling walker (2 wheels) General ADL Comments: pt remains limited by decreased strength, balance, pain and tolerance    Extremity/Trunk Assessment              Vision       Perception  Praxis      Cognition Arousal/Alertness: Awake/alert Behavior During Therapy: WFL for tasks assessed/performed Overall Cognitive Status: Within Functional Limits for tasks assessed                                          Exercises      Shoulder  Instructions       General Comments VSS    Pertinent Vitals/ Pain       Pain Assessment Pain Assessment: Faces Faces Pain Scale: Hurts little more Pain Location: L hip/lower leg Pain Descriptors / Indicators: Sore, Discomfort, Grimacing Pain Intervention(s): Limited activity within patient's tolerance, Monitored during session, Repositioned  Home Living                                          Prior Functioning/Environment              Frequency  Min 2X/week        Progress Toward Goals  OT Goals(current goals can now be found in the care plan section)  Progress towards OT goals: Progressing toward goals  Acute Rehab OT Goals Patient Stated Goal: rehab OT Goal Formulation: With patient Time For Goal Achievement: 08/08/22 Potential to Achieve Goals: Good  Plan Discharge plan remains appropriate;Frequency remains appropriate    Co-evaluation                 AM-PAC OT "6 Clicks" Daily Activity     Outcome Measure   Help from another person eating meals?: None Help from another person taking care of personal grooming?: A Little Help from another person toileting, which includes using toliet, bedpan, or urinal?: A Lot Help from another person bathing (including washing, rinsing, drying)?: A Lot Help from another person to put on and taking off regular upper body clothing?: A Little Help from another person to put on and taking off regular lower body clothing?: Total 6 Click Score: 15    End of Session Equipment Utilized During Treatment: Gait belt;Rolling walker (2 wheels)  OT Visit Diagnosis: Unsteadiness on feet (R26.81);Other abnormalities of gait and mobility (R26.89);Muscle weakness (generalized) (M62.81);Pain   Activity Tolerance Patient tolerated treatment well   Patient Left in chair;with call bell/phone within reach;with chair alarm set   Nurse Communication Mobility status        Time: 1610-9604 OT Time Calculation  (min): 21 min  Charges: OT General Charges $OT Visit: 1 Visit OT Treatments $Self Care/Home Management : 8-22 mins  Barry Brunner, OT Acute Rehabilitation Services Office [phone]   Sierra Ford 07/31/2022, 1:45 PM

## 2022-07-31 NOTE — Progress Notes (Signed)
Pt left via [facility] , left in satisfactory condition.

## 2022-07-31 NOTE — Discharge Summary (Signed)
Physician Discharge Summary  Patient ID: Sierra Ford MRN: [id number] DOB/AGE: 81/25/1942 81 y.o.  Admit date: 07/24/2022 Discharge date: 07/31/2022  Admission Diagnoses:   L3-5 lumbar spondylosis, stenosis  Discharge Diagnoses:  Same Principal Problem:   Lumbar stenosis Active Problems:   Other spondylosis with radiculopathy, lumbar region   Discharged Condition: Stable  Hospital Course:  Sierra Ford is a 82 y.o. female who underwent L3-L5 LLIF w/ percutaneous instrumentation on 07/24/22. She was recovered in PACU and transferred to [facility]. It was noted postoperatively that patient was experiencing some decreased sensation to LLE anterior thigh c/w plexopathy. She continues to work with PT/OT and is amenable to discharge to SNF today. Pt also developed some urinary retention on 07/30/22. She was recatheterized overnight. Foley removed, and pt failed voiding trial. She will dc w/ foley and f/u w/ Urology in 1-2 weeks for reassessment.   Treatments: Direct Lumbar Interbody Fusion PRONE TRANSPSOAS Lumbar three-four, Lumbar four-five LUMBAR PERCUTANEOUS PEDICLE SCREW LUMBAR THREE-FOUR, LUMBAR FOUR-FIVE  Discharge Exam: Blood pressure 119/60, pulse 72, temperature 98.1 F (36.7 C), temperature source Oral, resp. rate 16, height 5\' 2"  (1.575 m), weight 74.8 kg, SpO2 97 %. Awake, alert, oriented Speech fluent, appropriate 4/5 BUE/BLE Wounds c/d/I Decreased sensation to L anterior thigh.  Disposition: Discharge disposition: 03-Skilled Nursing Facility       Discharge Instructions     Incentive spirometry RT   Complete by: As directed       Allergies as of 07/31/2022   No Known Allergies      Medication List     STOP taking these medications    traMADol 50 MG tablet Commonly known as: ULTRAM       TAKE these medications    apixaban 2.5 MG Tabs tablet Commonly known as: Eliquis Take 1 tablet (2.5 mg total) by mouth 2 (two) times daily.   blood glucose  meter kit and supplies Kit Dispense based on patient and insurance preference. Use up to four times daily as directed. (FOR ICD-9 250.00, 250.01).   CALCIUM 600 + D PO Take 1 tablet by mouth daily.   cholecalciferol 25 MCG (1000 UNIT) tablet Commonly known as: VITAMIN D3 Take 1,000 Units by mouth 2 (two) times daily.   Coenzyme Q10 100 MG Tabs Take 100 mg by mouth daily.   Fish Oil 1000 MG Caps Take 1,000 mg by mouth daily.   Flaxseed Oil 1200 MG Caps Take 1,200 mg by mouth daily.   furosemide 40 MG tablet Commonly known as: LASIX TAKE 1 TABLET DAILY What changed:  when to take this reasons to take this   gabapentin 300 MG capsule Commonly known as: NEURONTIN Take 300 mg by mouth at bedtime as needed (pain/sleep).   methocarbamol 750 MG tablet Commonly known as: Robaxin-750 Take 1 tablet (750 mg total) by mouth 4 (four) times daily. What changed:  when to take this reasons to take this   multivitamin capsule Take 1 capsule by mouth daily with breakfast.   NovoFine Plus Pen Needle 32G X 4 MM Misc Generic drug: Insulin Pen Needle As directed   OVER THE COUNTER MEDICATION Apply 1 Application topically at bedtime. Nervive cream, apply to hands   oxyCODONE 5 MG immediate release tablet Commonly known as: Oxy IR/ROXICODONE Take 1 tablet (5 mg total) by mouth every 6 (six) hours as needed for moderate pain ((score 4 to 6)).   Ozempic (1 MG/DOSE) 4 MG/3ML Sopn Generic drug: Semaglutide (1 MG/DOSE) Inject 1 mg as directed  once a week.   potassium chloride SA 20 MEQ tablet Commonly known as: KLOR-CON M Take 1 tablet (20 mEq total) by mouth daily. What changed:  when to take this reasons to take this   Repatha SureClick 140 MG/ML Soaj Generic drug: Evolocumab Inject 140 mg into the skin every 14 (fourteen) days.   rosuvastatin 10 MG tablet Commonly known as: CRESTOR Take 1 tablet (10 mg total) by mouth daily.   True Metrix Blood Glucose Test test  strip Generic drug: glucose blood TEST  UP  TO FOUR TIMES DAILY AS DIRECTED   TRUEplus Lancets 33G Misc TEST  UP TO FOUR TIMES DAILY AS DIRECTED   ZzzQuil 25 MG Caps Generic drug: diphenhydrAMINE HCl (Sleep) Take 25 mg by mouth at bedtime as needed (sleep).        Contact information for follow-up providers     [facility]. Schedule an appointment as soon as possible for a visit in 1 week(s).   Contact information: [address] [place] [address] [phone]        Bedelia Person, MD. Go to.   Specialty: Neurosurgery Contact information: [address] [place] [address] [phone]              Contact information for after-discharge care     Destination     [facility] Preferred SNF .   Service: Skilled Nursing Contact information: [address] Test Update Address [place] [address] [phone]                     Signed: Clovis Riley 07/31/2022, 3:02 PM

## 2022-07-31 NOTE — TOC Transition Note (Signed)
Transition of Care [facility]) - CM/SW Discharge Note   Patient Details  Name: Sierra Ford MRN: [id number] Date of Birth: [birth year]  Transition of Care [facility]) CM/SW Contact:  Eduard Roux, LCSW Phone Number: 07/31/2022, 3:16 PM   Clinical Narrative:     Patient will Discharge to: [facility] Discharge Date: 07/31/2022 Family Notified: LVM for daughter Transport By: Sharin Mons  Per MD patient is ready for discharge. RN, patient, and facility notified of discharge. Discharge Summary sent to facility. RN given number for [phone], Room 604-A. Ambulance transport requested for patient.   Clinical Social Worker signing off.  Antony Blackbird, MSW, LCSW Clinical Social Worker     Final next level of care: Skilled Nursing Facility Barriers to Discharge: Barriers Resolved   Patient Goals and CMS Choice      Discharge Placement                Patient chooses bed at: [facility] Patient to be transferred to facility by: [facility] Name of family member notified: left voice message w/daughter Patient and family notified of of transfer: 07/31/22  Discharge Plan and Services Additional resources added to the After Visit Summary for   In-house Referral: Clinical Social Work                                   Social Determinants of Health (SDOH) Interventions SDOH Screenings   Food Insecurity: No Food Insecurity (07/24/2022)  Housing: Low Risk  (07/24/2022)  Transportation Needs: No Transportation Needs (07/24/2022)  Utilities: Not At Risk (07/24/2022)  Alcohol Screen: Low Risk  (08/15/2020)  Depression (PHQ2-9): Low Risk  (03/09/2022)  Financial Resource Strain: Medium Risk (03/20/2021)  Physical Activity: Sufficiently Active (08/15/2020)  Social Connections: Moderately Integrated (08/15/2020)  Stress: No Stress Concern Present (08/15/2020)  Tobacco Use: Medium Risk (07/24/2022)     Readmission Risk Interventions     No data to display

## 2022-08-01 DIAGNOSIS — M4316 Spondylolisthesis, lumbar region: Secondary | ICD-10-CM | POA: Diagnosis not present

## 2022-08-01 DIAGNOSIS — M1611 Unilateral primary osteoarthritis, right hip: Secondary | ICD-10-CM | POA: Diagnosis not present

## 2022-08-01 DIAGNOSIS — I10 Essential (primary) hypertension: Secondary | ICD-10-CM | POA: Diagnosis not present

## 2022-08-01 DIAGNOSIS — Z960 Presence of urogenital implants: Secondary | ICD-10-CM | POA: Diagnosis not present

## 2022-08-01 DIAGNOSIS — E1165 Type 2 diabetes mellitus with hyperglycemia: Secondary | ICD-10-CM | POA: Diagnosis not present

## 2022-08-01 DIAGNOSIS — R338 Other retention of urine: Secondary | ICD-10-CM | POA: Diagnosis not present

## 2022-08-06 DIAGNOSIS — Z87440 Personal history of urinary (tract) infections: Secondary | ICD-10-CM | POA: Diagnosis not present

## 2022-08-06 DIAGNOSIS — Z960 Presence of urogenital implants: Secondary | ICD-10-CM | POA: Diagnosis not present

## 2022-08-06 DIAGNOSIS — R338 Other retention of urine: Secondary | ICD-10-CM | POA: Diagnosis not present

## 2022-08-07 ENCOUNTER — Encounter (HOSPITAL_COMMUNITY): Payer: Self-pay | Admitting: Neurosurgery

## 2022-08-07 ENCOUNTER — Telehealth: Payer: Self-pay | Admitting: Family Medicine

## 2022-08-07 NOTE — Telephone Encounter (Signed)
Pt wanted to speak to [facility] regarding ozempic samples. She sated she is runing low and wanted to get that set up before she leaves rehab.

## 2022-08-07 NOTE — Telephone Encounter (Signed)
Patient will leave rehab 08/16/2022 - She will need Ozempic 1mg  pen due to cost of Ozempic and she has not qualified for medication assistance program due to having commercial insurance. She has Medicare but only part A and B (no part D coverage). Cost of Ozempic with her insurance is $250. Using discount card only lowers to about $100 or 150 per month.  I have a sample of Ozempic 1mg  = 4 doses that I will leave for her. Dorcas Mcmurray her nephew will pick up 08/08/2022

## 2022-08-08 DIAGNOSIS — I700 Atherosclerosis of aorta: Secondary | ICD-10-CM | POA: Diagnosis not present

## 2022-08-08 DIAGNOSIS — M4316 Spondylolisthesis, lumbar region: Secondary | ICD-10-CM | POA: Diagnosis not present

## 2022-08-08 DIAGNOSIS — I10 Essential (primary) hypertension: Secondary | ICD-10-CM | POA: Diagnosis not present

## 2022-08-08 DIAGNOSIS — E1165 Type 2 diabetes mellitus with hyperglycemia: Secondary | ICD-10-CM | POA: Diagnosis not present

## 2022-08-15 DIAGNOSIS — R338 Other retention of urine: Secondary | ICD-10-CM | POA: Diagnosis not present

## 2022-08-17 DIAGNOSIS — E1165 Type 2 diabetes mellitus with hyperglycemia: Secondary | ICD-10-CM | POA: Diagnosis not present

## 2022-08-17 DIAGNOSIS — M4316 Spondylolisthesis, lumbar region: Secondary | ICD-10-CM | POA: Diagnosis not present

## 2022-08-17 DIAGNOSIS — R52 Pain, unspecified: Secondary | ICD-10-CM | POA: Diagnosis not present

## 2022-08-17 DIAGNOSIS — I509 Heart failure, unspecified: Secondary | ICD-10-CM | POA: Diagnosis not present

## 2022-08-17 DIAGNOSIS — I10 Essential (primary) hypertension: Secondary | ICD-10-CM | POA: Diagnosis not present

## 2022-08-17 DIAGNOSIS — R278 Other lack of coordination: Secondary | ICD-10-CM | POA: Diagnosis not present

## 2022-08-17 DIAGNOSIS — I5040 Unspecified combined systolic (congestive) and diastolic (congestive) heart failure: Secondary | ICD-10-CM | POA: Diagnosis not present

## 2022-08-17 DIAGNOSIS — R2689 Other abnormalities of gait and mobility: Secondary | ICD-10-CM | POA: Diagnosis not present

## 2022-08-17 DIAGNOSIS — M169 Osteoarthritis of hip, unspecified: Secondary | ICD-10-CM | POA: Diagnosis not present

## 2022-08-17 DIAGNOSIS — B964 Proteus (mirabilis) (morganii) as the cause of diseases classified elsewhere: Secondary | ICD-10-CM

## 2022-08-22 ENCOUNTER — Ambulatory Visit: Payer: Medicare PPO | Admitting: Pharmacist

## 2022-08-22 ENCOUNTER — Telehealth: Payer: Medicare PPO

## 2022-08-22 DIAGNOSIS — E1165 Type 2 diabetes mellitus with hyperglycemia: Secondary | ICD-10-CM

## 2022-08-22 NOTE — Progress Notes (Signed)
08/22/2022 Name: Sierra Ford MRN: [id number] DOB: 13-May-1940  Chief Complaint  Patient presents with   Diabetes   Medication Management    Follow up    Sierra Ford is a 82 y.o. year old female who presented for a telephone visit.   They were referred to the pharmacist by their PCP for assistance in managing diabetes, medication access, and complex medication management.    Subjective:  Patient has returned home on 08/20/22 from [facility] after having back surgery 07/24/2022. Has not started home physical therapy yet but is expecting call today or tomorrow.   Care Team: Primary Care Provider: Seabron Spates, DO; Next Scheduled Visit: not scheduled at this time Cardiologist: Sierra Ford; Next Scheduled Visit: recall scheduled for 04/2023 Pulmonology - [facility] - follow up CPAP check.    Type 2 DM:  Current medications: Ozempic 1mg  weekly on Tuesdays.  Cost on her [employer] insurance is $250 / 28 days. Has a copay card from [employer] which lowers by $150 per month. She has also been using [employer] to off set cost but funding ran out earlier this year. She was not eligible to re-enroll until today.    Patient has 4 weeks of Ozempic 1mg  on hand.   Patient has glucometer at home but only checks blood glucose a few times per month. Blood glucose usually 110 to 145.  Starting weight prior to Ozempic = 193lbs.  Last office weight = 165 lbs Total amount of weight loss = 28 lbs  BP Readings from Last 3 Encounters:  07/31/22 119/60  07/17/22 (!) 132/54  05/22/22 (!) 142/90    Objective:  Lab Results  Component Value Date   HGBA1C 6.3 (H) 07/17/2022    Lab Results  Component Value Date   CREATININE 0.76 07/17/2022   BUN 5 (L) 07/17/2022   NA 139 07/17/2022   K 3.9 07/17/2022   CL 105 07/17/2022   CO2 25 07/17/2022    Lab Results  Component Value Date   CHOL 188 03/22/2022   HDL 119.60 03/22/2022   LDLCALC 49 03/22/2022   LDLDIRECT 95.9  10/06/2012   TRIG 97.0 03/22/2022   CHOLHDL 2 03/22/2022    Medications Reviewed Today     Reviewed by Sierra Ford, RPH-CPP (Pharmacist) on 07/30/22 at 0909  Med List Status: <None>   Medication Order Taking? Sig Documenting Provider Last Dose Status Informant  apixaban (ELIQUIS) 2.5 MG TABS tablet [id number] Yes Take 1 tablet (2.5 mg total) by mouth 2 (two) times daily. Rollene Rotunda, MD Taking Active Self  blood glucose meter kit and supplies KIT [id number] Yes Dispense based on patient and insurance preference. Use up to four times daily as directed. (FOR ICD-9 250.00, 250.01). Donato Schultz, DO Taking Active Self  Calcium Carb-Cholecalciferol (CALCIUM 600 + D PO) [id number] Yes Take 1 tablet by mouth daily. [provider] Taking Active Self  cholecalciferol (VITAMIN D3) 25 MCG (1000 UNIT) tablet [id number] Yes Take 1,000 Units by mouth 2 (two) times daily. [provider] Taking Active Self  Coenzyme Q10 100 MG TABS [id number] Yes Take 100 mg by mouth daily. [provider] Taking Active Self  diphenhydrAMINE HCl, Sleep, (ZZZQUIL) 25 MG CAPS [id number]  Take 25 mg by mouth at bedtime as needed (sleep). [provider]  Active Self  Evolocumab (REPATHA SURECLICK) 140 MG/ML SOAJ [id number] Yes Inject 140 mg into the skin every 14 (fourteen) days. Rollene Rotunda, MD Taking Active Self  Flaxseed, Linseed, (  FLAXSEED OIL) 1200 MG CAPS [id number] Yes Take 1,200 mg by mouth daily. [provider] Taking Active Self  furosemide (LASIX) 40 MG tablet [id number] Yes TAKE 1 TABLET DAILY  Patient taking differently: Take 40 mg by mouth daily as needed for edema.   Rollene Rotunda, MD Taking Active Self           Med Note Ernest Mallick, MINDY L   Wed Nov 08, 2021  9:21 AM)    gabapentin (NEURONTIN) 300 MG capsule [id number] Yes Take 300 mg by mouth at bedtime as needed (pain/sleep). [provider] Taking Active Self  Insulin Pen Needle (NOVOFINE PLUS  PEN NEEDLE) 32G X 4 MM MISC [id number] Yes As directed Donato Schultz, DO Taking Active Self  methocarbamol (ROBAXIN-750) 750 MG tablet [id number] Yes Take 1 tablet (750 mg total) by mouth 4 (four) times daily.  Patient taking differently: Take 750 mg by mouth 4 (four) times daily as needed for muscle spasms.   Donato Schultz, DO Taking Active Self  Multiple Vitamin (MULTIVITAMIN) capsule [id number] Yes Take 1 capsule by mouth daily with breakfast. [provider] Taking Active Self    Discontinued 05/07/11 1124 (Discontinued by provider) Omega-3 Fatty Acids (FISH OIL) 1000 MG CAPS [id number] Yes Take 1,000 mg by mouth daily. [provider] Taking Active Self  OVER THE COUNTER MEDICATION [id number] Yes Apply 1 Application topically at bedtime. Nervive cream, apply to hands [provider] Taking Active Self  potassium chloride SA (KLOR-CON M) 20 MEQ tablet [id number] Yes Take 1 tablet (20 mEq total) by mouth daily.  Patient taking differently: Take 20 mEq by mouth daily as needed (when taking lasix).   Sharlene Dory, NP Taking Active Self    Discontinued 05/07/11 1124 (Change in therapy) rosuvastatin (CRESTOR) 10 MG tablet [id number] Yes Take 1 tablet (10 mg total) by mouth daily. Rollene Rotunda, MD Taking Active Self  Semaglutide, 1 MG/DOSE, 4 MG/3ML SOPN [id number] Yes Inject 1 mg as directed once a week. Donato Schultz, DO Taking Active Self           Med Note Joella Prince A   Wed Jul 11, 2022  1:15 PM) Tuesdays   traMADol (ULTRAM) 50 MG tablet [id number] Yes TAKE 1 TABLET BY MOUTH EVERY 8 HOURS AS NEEDED FOR UP TO 5 DAYS Donato Schultz, DO Taking Active Self  TRUE METRIX BLOOD GLUCOSE TEST test strip [id number] Yes TEST  UP  TO FOUR TIMES DAILY AS DIRECTED Donato Schultz, DO Taking Active Self  TRUEPLUS LANCETS 33G MISC [id number] Yes TEST  UP TO FOUR TIMES DAILY AS DIRECTED Donato Schultz, DO Taking Active Self               Assessment/Plan:   Diabetes: - Currently controlled - Reviewed goal A1c, goal fasting, and goal 2 hour post prandial glucose - Recommend to continue Ozempic 1mg  weekly (if cost becomes an issue later or we are unable to get samples, then could try metoformin ER 500mg ) - Recommend to check glucose once or twice per week.    Follow Up Plan: 4 weeks.   Sierra Ford, PharmD Clinical Pharmacist [facility]

## 2022-08-27 ENCOUNTER — Telehealth: Payer: Self-pay | Admitting: Internal Medicine

## 2022-08-27 ENCOUNTER — Ambulatory Visit (INDEPENDENT_AMBULATORY_CARE_PROVIDER_SITE_OTHER): Payer: Medicare PPO | Admitting: *Deleted

## 2022-08-27 ENCOUNTER — Encounter: Payer: Self-pay | Admitting: Internal Medicine

## 2022-08-27 DIAGNOSIS — Z0000 Encounter for general adult medical examination without abnormal findings: Secondary | ICD-10-CM

## 2022-08-27 DIAGNOSIS — Z87891 Personal history of nicotine dependence: Secondary | ICD-10-CM | POA: Diagnosis not present

## 2022-08-27 NOTE — Telephone Encounter (Signed)
FYI

## 2022-08-27 NOTE — Patient Instructions (Signed)
[name] , Thank you for taking time to come for your Medicare Wellness Visit. I appreciate your ongoing commitment to your health goals. Please review the following plan we discussed and let me know if I can assist you in the future.      This is a list of the screening recommended for you and due dates:  Health Maintenance  Topic Date Due   Screening for Lung Cancer  01/27/2020   DEXA scan (bone density measurement)  07/14/2021   Eye exam for diabetics  10/20/2021   Complete foot exam   09/05/2022   Flu Shot  09/20/2022   Mammogram  10/21/2022   Hemoglobin A1C  01/17/2023   Yearly kidney health urinalysis for diabetes  03/10/2023   Yearly kidney function blood test for diabetes  07/17/2023   Medicare Annual Wellness Visit  08/27/2023   DTaP/Tdap/Td vaccine (4 - Td or Tdap) 11/05/2028   Pneumonia Vaccine  Completed   COVID-19 Vaccine  Completed   Zoster (Shingles) Vaccine  Completed   HPV Vaccine  Aged Out   Hepatitis C Screening  Discontinued    Next appointment: Follow up in one year for your annual wellness visit.   Preventive Care 10 Years and Older, Female Preventive care refers to lifestyle choices and visits with your health care provider that can promote health and wellness. What does preventive care include? A yearly physical exam. This is also called an annual well check. Dental exams once or twice a year. Routine eye exams. Ask your health care provider how often you should have your eyes checked. Personal lifestyle choices, including: Daily care of your teeth and gums. Regular physical activity. Eating a healthy diet. Avoiding tobacco and drug use. Limiting alcohol use. Practicing safe sex. Taking low-dose aspirin every day. Taking vitamin and mineral supplements as recommended by your health care provider. What happens during an annual well check? The services and screenings done by your health care provider during your annual well check will depend on  your age, overall health, lifestyle risk factors, and family history of disease. Counseling  Your health care provider may ask you questions about your: Alcohol use. Tobacco use. Drug use. Emotional well-being. Home and relationship well-being. Sexual activity. Eating habits. History of falls. Memory and ability to understand (cognition). Work and work Astronomer. Reproductive health. Screening  You may have the following tests or measurements: Height, weight, and BMI. Blood pressure. Lipid and cholesterol levels. These may be checked every 5 years, or more frequently if you are over 24 years old. Skin check. Lung cancer screening. You may have this screening every year starting at age 6 if you have a 30-pack-year history of smoking and currently smoke or have quit within the past 15 years. Fecal occult blood test (FOBT) of the stool. You may have this test every year starting at age 70. Flexible sigmoidoscopy or colonoscopy. You may have a sigmoidoscopy every 5 years or a colonoscopy every 10 years starting at age 81. Hepatitis C blood test. Hepatitis B blood test. Sexually transmitted disease (STD) testing. Diabetes screening. This is done by checking your blood sugar (glucose) after you have not eaten for a while (fasting). You may have this done every 1-3 years. Bone density scan. This is done to screen for osteoporosis. You may have this done starting at age 61. Mammogram. This may be done every 1-2 years. Talk to your health care provider about how often you should have regular mammograms. Talk with your health care  provider about your test results, treatment options, and if necessary, the need for more tests. Vaccines  Your health care provider may recommend certain vaccines, such as: Influenza vaccine. This is recommended every year. Tetanus, diphtheria, and acellular pertussis (Tdap, Td) vaccine. You may need a Td booster every 10 years. Zoster vaccine. You may need this  after age 59. Pneumococcal 13-valent conjugate (PCV13) vaccine. One dose is recommended after age 79. Pneumococcal polysaccharide (PPSV23) vaccine. One dose is recommended after age 68. Talk to your health care provider about which screenings and vaccines you need and how often you need them. This information is not intended to replace advice given to you by your health care provider. Make sure you discuss any questions you have with your health care provider. Document Released: 03/04/2015 Document Revised: 10/26/2015 Document Reviewed: 12/07/2014 Elsevier Interactive Patient Education  2017 [employer].  Fall Prevention in the Home Falls can cause injuries. They can happen to people of all ages. There are many things you can do to make your home safe and to help prevent falls. What can I do on the outside of my home? Regularly fix the edges of walkways and driveways and fix any cracks. Remove anything that might make you trip as you walk through a door, such as a raised step or threshold. Trim any bushes or trees on the path to your home. Use bright outdoor lighting. Clear any walking paths of anything that might make someone trip, such as rocks or tools. Regularly check to see if handrails are loose or broken. Make sure that both sides of any steps have handrails. Any raised decks and porches should have guardrails on the edges. Have any leaves, snow, or ice cleared regularly. Use sand or salt on walking paths during winter. Clean up any spills in your garage right away. This includes oil or grease spills. What can I do in the bathroom? Use night lights. Install grab bars by the toilet and in the tub and shower. Do not use towel bars as grab bars. Use non-skid mats or decals in the tub or shower. If you need to sit down in the shower, use a plastic, non-slip stool. Keep the floor dry. Clean up any water that spills on the floor as soon as it happens. Remove soap buildup in the tub or  shower regularly. Attach bath mats securely with double-sided non-slip rug tape. Do not have throw rugs and other things on the floor that can make you trip. What can I do in the bedroom? Use night lights. Make sure that you have a light by your bed that is easy to reach. Do not use any sheets or blankets that are too big for your bed. They should not hang down onto the floor. Have a firm chair that has side arms. You can use this for support while you get dressed. Do not have throw rugs and other things on the floor that can make you trip. What can I do in the kitchen? Clean up any spills right away. Avoid walking on wet floors. Keep items that you use a lot in easy-to-reach places. If you need to reach something above you, use a strong step stool that has a grab bar. Keep electrical cords out of the way. Do not use floor polish or wax that makes floors slippery. If you must use wax, use non-skid floor wax. Do not have throw rugs and other things on the floor that can make you trip. What can I  do with my stairs? Do not leave any items on the stairs. Make sure that there are handrails on both sides of the stairs and use them. Fix handrails that are broken or loose. Make sure that handrails are as long as the stairways. Check any carpeting to make sure that it is firmly attached to the stairs. Fix any carpet that is loose or worn. Avoid having throw rugs at the top or bottom of the stairs. If you do have throw rugs, attach them to the floor with carpet tape. Make sure that you have a light switch at the top of the stairs and the bottom of the stairs. If you do not have them, ask someone to add them for you. What else can I do to help prevent falls? Wear shoes that: Do not have high heels. Have rubber bottoms. Are comfortable and fit you well. Are closed at the toe. Do not wear sandals. If you use a stepladder: Make sure that it is fully opened. Do not climb a closed stepladder. Make  sure that both sides of the stepladder are locked into place. Ask someone to hold it for you, if possible. Clearly mark and make sure that you can see: Any grab bars or handrails. First and last steps. Where the edge of each step is. Use tools that help you move around (mobility aids) if they are needed. These include: Canes. Walkers. Scooters. Crutches. Turn on the lights when you go into a dark area. Replace any light bulbs as soon as they burn out. Set up your furniture so you have a clear path. Avoid moving your furniture around. If any of your floors are uneven, fix them. If there are any pets around you, be aware of where they are. Review your medicines with your doctor. Some medicines can make you feel dizzy. This can increase your chance of falling. Ask your doctor what other things that you can do to help prevent falls. This information is not intended to replace advice given to you by your health care provider. Make sure you discuss any questions you have with your health care provider. Document Released: 12/02/2008 Document Revised: 07/14/2015 Document Reviewed: 03/12/2014 Elsevier Interactive Patient Education  2017 [employer].

## 2022-08-27 NOTE — Progress Notes (Signed)
Subjective:   Sierra Ford is a 82 y.o. female who presents for Medicare Annual (Subsequent) preventive examination.  Visit Complete: Virtual  I connected with  FRANCESE Ford on 08/27/22 by a audio enabled telemedicine application and verified that I am speaking with the correct person using two identifiers.  Patient Location: Home  Provider Location: Office/Clinic  I discussed the limitations of evaluation and management by telemedicine. The patient expressed understanding and agreed to proceed.   Review of Systems     Cardiac Risk Factors include: advanced age (>22men, >99 women);obesity (BMI >30kg/m2);diabetes mellitus;dyslipidemia;hypertension     Objective:    Today's Vitals   08/27/22 1104  PainSc: 6    There is no height or weight on file to calculate BMI.     08/27/2022   11:17 AM 07/24/2022    8:00 PM 07/24/2022    7:56 AM 07/17/2022   11:27 AM 02/07/2022    9:08 AM 08/24/2021    1:04 PM 08/15/2020   11:47 AM  Advanced Directives  Does Patient Have a Medical Advance Directive? No No Yes Yes No Yes No;Yes  Type of Best boy of [place];Living will Healthcare Power of [place];Living will  Healthcare Power of [place];Out of facility DNR (pink MOST or yellow form);Living will Healthcare Power of Attorney  Does patient want to make changes to medical advance directive?  No - Patient declined No - Patient declined No - Patient declined  No - Patient declined   Copy of Healthcare Power of Attorney in Chart?   No - copy requested   No - copy requested No - copy requested  Would patient like information on creating a medical advance directive? No - Patient declined No - Patient declined         Current Medications (verified) Outpatient Encounter Medications as of 08/27/2022  Medication Sig   apixaban (ELIQUIS) 2.5 MG TABS tablet Take 1 tablet (2.5 mg total) by mouth 2 (two) times daily.   blood glucose meter kit and supplies KIT Dispense  based on patient and insurance preference. Use up to four times daily as directed. (FOR ICD-9 250.00, 250.01).   Calcium Carb-Cholecalciferol (CALCIUM 600 + D PO) Take 1 tablet by mouth daily.   cholecalciferol (VITAMIN D3) 25 MCG (1000 UNIT) tablet Take 1,000 Units by mouth 2 (two) times daily.   Coenzyme Q10 100 MG TABS Take 100 mg by mouth daily.   diphenhydrAMINE HCl, Sleep, (ZZZQUIL) 25 MG CAPS Take 25 mg by mouth at bedtime as needed (sleep).   Evolocumab (REPATHA SURECLICK) 140 MG/ML SOAJ Inject 140 mg into the skin every 14 (fourteen) days.   Flaxseed, Linseed, (FLAXSEED OIL) 1200 MG CAPS Take 1,200 mg by mouth daily.   furosemide (LASIX) 40 MG tablet TAKE 1 TABLET DAILY (Patient taking differently: Take 40 mg by mouth daily as needed for edema.)   gabapentin (NEURONTIN) 300 MG capsule Take 300 mg by mouth at bedtime as needed (pain/sleep).   Insulin Pen Needle (NOVOFINE PLUS PEN NEEDLE) 32G X 4 MM MISC As directed   methocarbamol (ROBAXIN-750) 750 MG tablet Take 1 tablet (750 mg total) by mouth 4 (four) times daily. (Patient taking differently: Take 750 mg by mouth 4 (four) times daily as needed for muscle spasms.)   Multiple Vitamin (MULTIVITAMIN) capsule Take 1 capsule by mouth daily with breakfast.   Omega-3 Fatty Acids (FISH OIL) 1000 MG CAPS Take 1,000 mg by mouth daily.   OVER THE COUNTER MEDICATION Apply  1 Application topically at bedtime. Nervive cream, apply to hands   oxyCODONE (OXY IR/ROXICODONE) 5 MG immediate release tablet Take 1 tablet (5 mg total) by mouth every 6 (six) hours as needed for moderate pain ((score 4 to 6)).   potassium chloride SA (KLOR-CON M) 20 MEQ tablet Take 1 tablet (20 mEq total) by mouth daily. (Patient taking differently: Take 20 mEq by mouth daily as needed (when taking lasix).)   rosuvastatin (CRESTOR) 10 MG tablet Take 1 tablet (10 mg total) by mouth daily.   Semaglutide, 1 MG/DOSE, 4 MG/3ML SOPN Inject 1 mg as directed once a week.   TRUE METRIX  BLOOD GLUCOSE TEST test strip TEST  UP  TO FOUR TIMES DAILY AS DIRECTED   TRUEPLUS LANCETS 33G MISC TEST  UP TO FOUR TIMES DAILY AS DIRECTED   [DISCONTINUED] omega-3 acid ethyl esters (LOVAZA) 1 G capsule Take 1 g by mouth 3 (three) times daily.     [DISCONTINUED] pravastatin (PRAVACHOL) 20 MG tablet Take 20 mg by mouth daily.     No facility-administered encounter medications on file as of 08/27/2022.    Allergies (verified) Patient has no known allergies.   History: Past Medical History:  Diagnosis Date   Anxiety    Back pain    Chest pain    CLite with apical ischemia in 2006 - normal coronary arteries by [facility] in 12/2004;  Myoview 11/12:  Low risk stress nuclear study with a small, partially reversible apical defect most likely related to apical thinning; cannot R/O very mild apical ischemia.  EF: 75%    Complication of anesthesia    trouble waking after colonoscopy slept all day afterward into the next morning   Decreased hearing    Depression    Diabetes (HCC)    type II   DVT (deep venous thrombosis) (HCC) 2006   hx of and again in 2020   Ectopic pregnancy with intrauterine pregnancy    Hiatal hernia    patient not aware of hernia   Hyperlipidemia    PE (pulmonary embolism) 2006   hx of and again in 2020   Sleep apnea    CPAP    Past Surgical History:  Procedure Laterality Date   ABDOMINAL EXPLORATION SURGERY     ANTERIOR LAT LUMBAR FUSION N/A 07/24/2022   Procedure: Direct Lumbar Interbody Fusion PRONE TRANSPSOAS Lumbar three-four, Lumbar four-five;  Surgeon: Bedelia Person, MD;  Location: [facility] OR;  Service: Neurosurgery;  Laterality: N/A;   BUNIONECTOMY Right 1985   right   COLONOSCOPY  2009   every 5 years   ECTOPIC PREGNANCY SURGERY     FOOT SURGERY Right 1985   LUMBAR PERCUTANEOUS PEDICLE SCREW 2 LEVEL N/A 07/24/2022   Procedure: LUMBAR PERCUTANEOUS PEDICLE SCREW LUMBAR THREE-FOUR, LUMBAR FOUR-FIVE;  Surgeon: Bedelia Person, MD;  Location: [facility] OR;  Service:  Neurosurgery;  Laterality: N/A;   TUBAL LIGATION     Family History  Problem Relation Age of Onset   Heart failure Mother        died from   Colon polyps Mother    Hypertension Mother    Sudden death Mother    Obesity Mother    Alcoholism Father    Diabetes Maternal Aunt        grandaughter   Diabetes Maternal Aunt    Colon cancer Neg Hx    Esophageal cancer Neg Hx    Stomach cancer Neg Hx    Social History   Socioeconomic History  Marital status: Divorced    Spouse name: Not on file   Number of children: 1   Years of education: Not on file   Highest education level: Not on file  Occupational History   Occupation: Retired Research scientist (physical sciences)  Tobacco Use   Smoking status: Former    Packs/day: 1.00    Years: 50.00    Additional pack years: 0.00    Total pack years: 50.00    Types: Cigarettes    Quit date: 11/21/2009    Years since quitting: 12.7   Smokeless tobacco: Never  Vaping Use   Vaping Use: Never used  Substance and Sexual Activity   Alcohol use: Yes    Comment: rare 3 times a year per pt   Drug use: No   Sexual activity: Not Currently    Partners: Male  Other Topics Concern   Not on file  Social History Narrative   Regular exercise--- no   Social Determinants of Health   Financial Resource Strain: Low Risk  (08/27/2022)   Overall Financial Resource Strain (CARDIA)    Difficulty of Paying Living Expenses: Not hard at all  Food Insecurity: No Food Insecurity (07/24/2022)   Hunger Vital Sign    Worried About Running Out of Food in the Last Year: Never true    Ran Out of Food in the Last Year: Never true  Transportation Needs: No Transportation Needs (07/24/2022)   PRAPARE - Administrator, Civil Service (Medical): No    Lack of Transportation (Non-Medical): No  Physical Activity: Inactive (08/27/2022)   Exercise Vital Sign    Days of Exercise per Week: 0 days    Minutes of Exercise per Session: 0 min  Stress: No Stress Concern Present (08/15/2020)    [employer] of Occupational Health - Occupational Stress Questionnaire    Feeling of Stress : Only a little  Social Connections: Moderately Integrated (08/15/2020)   Social Connection and Isolation Panel [NHANES]    Frequency of Communication with Friends and Family: More than three times a week    Frequency of Social Gatherings with Friends and Family: More than three times a week    Attends Religious Services: More than 4 times per year    Active Member of [employer] or Organizations: Yes    Attends Banker Meetings: 1 to 4 times per year    Marital Status: Widowed    Tobacco Counseling Counseling given: Not Answered   Clinical Intake:  Pre-visit preparation completed: Yes  Pain : 0-10 Pain Score: 6  Pain Type: Chronic pain Pain Location: Leg Pain Orientation: Left Pain Descriptors / Indicators: Aching, Constant, Radiating Pain Onset: More than a month ago Pain Frequency: Constant Pain Relieving Factors: Oxycodone  Pain Relieving Factors: Oxycodone  Nutritional Risks: None Diabetes: Yes CBG done?: No Did pt. bring in CBG monitor from home?: No  How often do you need to have someone help you when you read instructions, pamphlets, or other written materials from your doctor or pharmacy?: 1 - Never  Interpreter Needed?: No  Information entered by :: Donne Anon, CMA   Activities of Daily Living    08/27/2022   11:09 AM 07/24/2022    8:00 PM  In your present state of health, do you have any difficulty performing the following activities:  Hearing? 1 0  Comment hearing loss worse in left ear   Vision? 0 0  Difficulty concentrating or making decisions? 1 0  Comment remembering things   Walking or  climbing stairs? 1 1  Dressing or bathing? 0 0  Doing errands, shopping? 0 0  Comment hasn't been released to drive yet from back surgery-family members are assisting   Preparing Food and eating ? Y   Comment preparing food   Using the Toilet? N   In the  past six months, have you accidently leaked urine? N   Do you have problems with loss of bowel control? Y   Comment has had a few accidents   Managing your Medications? N   Managing your Finances? N   Housekeeping or managing your Housekeeping? Y   Comment recent back surgery     Patient Care Team: Eloisa Northern, MD as PCP - General (Internal Medicine) Rollene Rotunda, MD as PCP - Cardiology (Cardiology) Freddy Finner, MD (Inactive) as Consulting Physician (Obstetrics and Gynecology) Henrene Pastor, RPH-CPP (Pharmacist) Oretha Milch, MD as Consulting Physician (Pulmonary Disease)  Indicate any recent Medical Services you may have received from other than [facility] providers in the past year (date may be approximate).     Assessment:   This is a routine wellness examination for Laural.  Hearing/Vision screen No results found.  Dietary issues and exercise activities discussed:     Goals Addressed   None    Depression Screen    08/27/2022   11:16 AM 03/09/2022   10:33 AM 08/24/2021    1:05 PM 08/15/2020   11:51 AM 08/11/2020   10:28 AM 05/10/2020   10:31 AM 05/05/2019    2:01 PM  PHQ 2/9 Scores  PHQ - 2 Score 0 0 1 0 0 0 0    Fall Risk    08/27/2022   11:08 AM 03/09/2022   10:31 AM 08/24/2021    1:05 PM 08/15/2020   11:49 AM 08/11/2020   10:28 AM  Fall Risk   Falls in the past year? 0 0 0 0 0  Number falls in past yr: 0 0 0 0 0  Injury with Fall? 0 0 0 0 0  Risk for fall due to : Impaired mobility  No Fall Risks    Risk for fall due to: Comment recent back surgery      Follow up Falls evaluation completed Falls evaluation completed Falls evaluation completed Falls prevention discussed     MEDICARE RISK AT HOME:  Medicare Risk at Home - 08/27/22 1108     Any stairs in or around the home? No    If so, are there any without handrails? No    Home free of loose throw rugs in walkways, pet beds, electrical cords, etc? Yes    Adequate lighting in your home to reduce risk of falls?  Yes    Life alert? No    Use of a cane, walker or w/c? Yes    Grab bars in the bathroom? Yes    Shower chair or bench in shower? Yes    Elevated toilet seat or a handicapped toilet? Yes             TIMED UP AND GO:  Was the test performed?  No    Cognitive Function:    10/09/2016    4:07 PM 07/15/2015   11:42 AM  MMSE - Mini Mental State Exam  Orientation to time 5 5  Orientation to Place 5 5  Registration 3 3  Attention/ Calculation 5 5  Recall 3 3  Language- name 2 objects 2 2  Language- repeat 1 1  Language- follow 3 step  command 3 3  Language- read & follow direction 1 1  Write a sentence 1 1  Copy design 1 1  Total score 30 30        08/27/2022   11:19 AM 08/24/2021    1:15 PM  6CIT Screen  What Year? 0 points 0 points  What month? 0 points 0 points  What time? 0 points 0 points  Count back from 20 0 points 0 points  Months in reverse 0 points 0 points  Repeat phrase 0 points 0 points  Total Score 0 points 0 points    Immunizations Immunization History  Administered Date(s) Administered   COVID-19, mRNA, vaccine(Comirnaty)12 years and older 12/07/2021   Fluad Quad(high Dose 65+) 12/04/2019, 11/03/2020, 12/29/2020, 12/07/2021   Influenza, High Dose Seasonal PF 12/22/2018   Influenza-Unspecified 12/04/2018   PFIZER Comirnaty(Gray Top)Covid-19 Tri-Sucrose Vaccine 06/08/2020   PFIZER(Purple Top)SARS-COV-2 Vaccination 03/06/2019, 03/27/2019, 11/10/2019   Pfizer Covid-19 Vaccine Bivalent Booster 34yrs & up 12/06/2020   Pneumococcal Conjugate-13 04/24/2013   Pneumococcal Polysaccharide-23 08/07/2007, 12/04/2019   Td 03/03/2003   Td (Adult), 2 Lf Tetanus Toxid, Preservative Free 03/03/2003   Tdap 11/06/2018   Zoster Recombinant(Shingrix) 11/06/2018, 05/11/2019   Zoster, Live 08/14/2007, 11/13/2007    TDAP status: Up to date  Flu Vaccine status: Up to date  Pneumococcal vaccine status: Up to date  Covid-19 vaccine status: Completed  vaccines  Qualifies for Shingles Vaccine? Yes   Zostavax completed Yes   Shingrix Completed?: Yes  Screening Tests Health Maintenance  Topic Date Due   Lung Cancer Screening  01/27/2020   DEXA SCAN  07/14/2021   OPHTHALMOLOGY EXAM  10/20/2021   Medicare Annual Wellness (AWV)  08/25/2022   FOOT EXAM  09/05/2022   INFLUENZA VACCINE  09/20/2022   MAMMOGRAM  10/21/2022   HEMOGLOBIN A1C  01/17/2023   Diabetic kidney evaluation - Urine ACR  03/10/2023   Diabetic kidney evaluation - eGFR measurement  07/17/2023   DTaP/Tdap/Td (4 - Td or Tdap) 11/05/2028   Pneumonia Vaccine 87+ Years old  Completed   COVID-19 Vaccine  Completed   Zoster Vaccines- Shingrix  Completed   HPV VACCINES  Aged Out   Hepatitis C Screening  Discontinued    Health Maintenance  Health Maintenance Due  Topic Date Due   Lung Cancer Screening  01/27/2020   DEXA SCAN  07/14/2021   OPHTHALMOLOGY EXAM  10/20/2021   Medicare Annual Wellness (AWV)  08/25/2022    Colorectal cancer screening: No longer required.   Mammogram status: Completed 10/20/21. Repeat every year  Bone Density status: Completed 07/15/19. Results reflect: Bone density results: NORMAL. Repeat every 2 years.  Lung Cancer Screening: (Low Dose CT Chest recommended if Age 69-80 years, 20 pack-year currently smoking OR have quit w/in 15years.) does qualify.   Lung Cancer Screening Referral: 08/27/22  Additional Screening:  Hepatitis C Screening: does not qualify  Vision Screening: Recommended annual ophthalmology exams for early detection of glaucoma and other disorders of the eye. Is the patient up to date with their annual eye exam?  Yes  Who is the provider or what is the name of the office in which the patient attends annual eye exams? [facility] If pt is not established with a provider, would they like to be referred to a provider to establish care? No .   Dental Screening: Recommended annual dental exams for proper oral  hygiene  Diabetic Foot Exam: Diabetic Foot Exam: Completed 09/04/21  Community Resource Referral / Chronic Care  Management: CRR required this visit?  No   CCM required this visit?  No     Plan:     I have personally reviewed and noted the following in the patient's chart:   Medical and social history Use of alcohol, tobacco or illicit drugs  Current medications and supplements including opioid prescriptions. Patient is currently taking opioid prescriptions. Information provided to patient regarding non-opioid alternatives. Patient advised to discuss non-opioid treatment plan with their provider. Functional ability and status Nutritional status Physical activity Advanced directives List of other physicians Hospitalizations, surgeries, and ER visits in previous 12 months Vitals Screenings to include cognitive, depression, and falls Referrals and appointments  In addition, I have reviewed and discussed with patient certain preventive protocols, quality metrics, and best practice recommendations. A written personalized care plan for preventive services as well as general preventive health recommendations were provided to patient.     Donne Anon, CMA   08/27/2022   After Visit Summary: (MyChart) Due to this being a telephonic visit, the after visit summary with patients personalized plan was offered to patient via MyChart   Nurse Notes: None

## 2022-08-27 NOTE — Telephone Encounter (Signed)
Frances Furbish called and wanted to inform provider there was a delay in referral due to not being able to get in contact with p-patient until today 7/8 and will go out 7/10.

## 2022-08-28 NOTE — Telephone Encounter (Signed)
error 

## 2022-08-29 DIAGNOSIS — M48061 Spinal stenosis, lumbar region without neurogenic claudication: Secondary | ICD-10-CM | POA: Diagnosis not present

## 2022-08-29 DIAGNOSIS — I110 Hypertensive heart disease with heart failure: Secondary | ICD-10-CM | POA: Diagnosis not present

## 2022-08-29 DIAGNOSIS — Z4789 Encounter for other orthopedic aftercare: Secondary | ICD-10-CM | POA: Diagnosis not present

## 2022-08-29 DIAGNOSIS — M1990 Unspecified osteoarthritis, unspecified site: Secondary | ICD-10-CM | POA: Diagnosis not present

## 2022-08-29 DIAGNOSIS — M4726 Other spondylosis with radiculopathy, lumbar region: Secondary | ICD-10-CM | POA: Diagnosis not present

## 2022-08-29 DIAGNOSIS — R338 Other retention of urine: Secondary | ICD-10-CM | POA: Diagnosis not present

## 2022-08-29 DIAGNOSIS — I509 Heart failure, unspecified: Secondary | ICD-10-CM | POA: Diagnosis not present

## 2022-08-29 DIAGNOSIS — H9190 Unspecified hearing loss, unspecified ear: Secondary | ICD-10-CM | POA: Diagnosis not present

## 2022-08-29 DIAGNOSIS — E119 Type 2 diabetes mellitus without complications: Secondary | ICD-10-CM | POA: Diagnosis not present

## 2022-08-30 ENCOUNTER — Telehealth: Payer: Self-pay | Admitting: Family Medicine

## 2022-08-30 DIAGNOSIS — I110 Hypertensive heart disease with heart failure: Secondary | ICD-10-CM | POA: Diagnosis not present

## 2022-08-30 DIAGNOSIS — M1990 Unspecified osteoarthritis, unspecified site: Secondary | ICD-10-CM | POA: Diagnosis not present

## 2022-08-30 DIAGNOSIS — H9190 Unspecified hearing loss, unspecified ear: Secondary | ICD-10-CM | POA: Diagnosis not present

## 2022-08-30 DIAGNOSIS — M4726 Other spondylosis with radiculopathy, lumbar region: Secondary | ICD-10-CM | POA: Diagnosis not present

## 2022-08-30 DIAGNOSIS — E119 Type 2 diabetes mellitus without complications: Secondary | ICD-10-CM | POA: Diagnosis not present

## 2022-08-30 DIAGNOSIS — R338 Other retention of urine: Secondary | ICD-10-CM | POA: Diagnosis not present

## 2022-08-30 DIAGNOSIS — Z4789 Encounter for other orthopedic aftercare: Secondary | ICD-10-CM | POA: Diagnosis not present

## 2022-08-30 DIAGNOSIS — I509 Heart failure, unspecified: Secondary | ICD-10-CM | POA: Diagnosis not present

## 2022-08-30 DIAGNOSIS — M48061 Spinal stenosis, lumbar region without neurogenic claudication: Secondary | ICD-10-CM | POA: Diagnosis not present

## 2022-08-30 NOTE — Telephone Encounter (Signed)
Caller/Agency: Rosanne Ashing [facility]) Callback Number: [phone] Requesting OT/PT/Skilled Nursing/Social Work/Speech Therapy: PT Frequency: 1 w 1, 2 w 4, 1 w 4  HH was concerned about numbness and weakness in her left thigh. Pt recently had surgery on lower back.  HH also wanted to get clarification on pt's methocarbamol. Not sure if it should be 500mg  every 6 hrs (hospital) or 750mg  (PCP).  HH also wanted to know if it is ok to use ice on lower back to control pain and swelling

## 2022-08-31 ENCOUNTER — Other Ambulatory Visit: Payer: Self-pay | Admitting: Cardiology

## 2022-08-31 DIAGNOSIS — R338 Other retention of urine: Secondary | ICD-10-CM | POA: Diagnosis not present

## 2022-08-31 DIAGNOSIS — I110 Hypertensive heart disease with heart failure: Secondary | ICD-10-CM | POA: Diagnosis not present

## 2022-08-31 DIAGNOSIS — M1990 Unspecified osteoarthritis, unspecified site: Secondary | ICD-10-CM | POA: Diagnosis not present

## 2022-08-31 DIAGNOSIS — H9190 Unspecified hearing loss, unspecified ear: Secondary | ICD-10-CM | POA: Diagnosis not present

## 2022-08-31 DIAGNOSIS — I509 Heart failure, unspecified: Secondary | ICD-10-CM | POA: Diagnosis not present

## 2022-08-31 DIAGNOSIS — M4726 Other spondylosis with radiculopathy, lumbar region: Secondary | ICD-10-CM | POA: Diagnosis not present

## 2022-08-31 DIAGNOSIS — E119 Type 2 diabetes mellitus without complications: Secondary | ICD-10-CM | POA: Diagnosis not present

## 2022-08-31 DIAGNOSIS — Z4789 Encounter for other orthopedic aftercare: Secondary | ICD-10-CM | POA: Diagnosis not present

## 2022-08-31 DIAGNOSIS — I10 Essential (primary) hypertension: Secondary | ICD-10-CM

## 2022-08-31 DIAGNOSIS — R600 Localized edema: Secondary | ICD-10-CM

## 2022-08-31 DIAGNOSIS — M48061 Spinal stenosis, lumbar region without neurogenic claudication: Secondary | ICD-10-CM | POA: Diagnosis not present

## 2022-09-03 ENCOUNTER — Other Ambulatory Visit: Payer: Self-pay | Admitting: Family Medicine

## 2022-09-03 ENCOUNTER — Telehealth: Payer: Self-pay | Admitting: Family Medicine

## 2022-09-03 DIAGNOSIS — I110 Hypertensive heart disease with heart failure: Secondary | ICD-10-CM | POA: Diagnosis not present

## 2022-09-03 DIAGNOSIS — H9190 Unspecified hearing loss, unspecified ear: Secondary | ICD-10-CM | POA: Diagnosis not present

## 2022-09-03 DIAGNOSIS — I509 Heart failure, unspecified: Secondary | ICD-10-CM | POA: Diagnosis not present

## 2022-09-03 DIAGNOSIS — M4726 Other spondylosis with radiculopathy, lumbar region: Secondary | ICD-10-CM | POA: Diagnosis not present

## 2022-09-03 DIAGNOSIS — M48061 Spinal stenosis, lumbar region without neurogenic claudication: Secondary | ICD-10-CM | POA: Diagnosis not present

## 2022-09-03 DIAGNOSIS — Z4789 Encounter for other orthopedic aftercare: Secondary | ICD-10-CM | POA: Diagnosis not present

## 2022-09-03 DIAGNOSIS — E119 Type 2 diabetes mellitus without complications: Secondary | ICD-10-CM | POA: Diagnosis not present

## 2022-09-03 DIAGNOSIS — R300 Dysuria: Secondary | ICD-10-CM

## 2022-09-03 DIAGNOSIS — R338 Other retention of urine: Secondary | ICD-10-CM | POA: Diagnosis not present

## 2022-09-03 DIAGNOSIS — M1990 Unspecified osteoarthritis, unspecified site: Secondary | ICD-10-CM | POA: Diagnosis not present

## 2022-09-03 NOTE — Telephone Encounter (Signed)
Pt called to follow up and advised pt that nephew could come pick up specimen cup.

## 2022-09-03 NOTE — Telephone Encounter (Signed)
Returned call. LDVM an verbal orders.

## 2022-09-03 NOTE — Telephone Encounter (Signed)
Please advise 

## 2022-09-03 NOTE — Telephone Encounter (Signed)
Pt called stating that she thinks she may have a UTI. Pt stated that she cannot come in for an appt due to still recovering from surgery. Advised pt that a note would be sent back to look into possible lab orders to check for UTI and asked if someone could facilitate grabbing a specimen cup from [name] and bring it back to test. Pt stated her nephew should be able to do that.

## 2022-09-03 NOTE — Telephone Encounter (Signed)
 Noted  

## 2022-09-04 ENCOUNTER — Other Ambulatory Visit (INDEPENDENT_AMBULATORY_CARE_PROVIDER_SITE_OTHER): Payer: Medicare PPO

## 2022-09-04 DIAGNOSIS — R300 Dysuria: Secondary | ICD-10-CM

## 2022-09-04 LAB — POC URINALSYSI DIPSTICK (AUTOMATED)
Bilirubin, UA: NEGATIVE
Blood, UA: NEGATIVE
Glucose, UA: NEGATIVE
Ketones, UA: NEGATIVE
Nitrite, UA: POSITIVE
Protein, UA: NEGATIVE
Spec Grav, UA: 1.01 (ref 1.010–1.025)
Urobilinogen, UA: 0.2 E.U./dL
pH, UA: 7 (ref 5.0–8.0)

## 2022-09-04 NOTE — Telephone Encounter (Signed)
Once orders come back pt would like it sent to her local pharmacy.   [id number] - [place] ([place]), [place] - [address] [place] ([place]) [place] [address] Phone: [phone]  Fax: [phone]

## 2022-09-05 DIAGNOSIS — Z4789 Encounter for other orthopedic aftercare: Secondary | ICD-10-CM | POA: Diagnosis not present

## 2022-09-05 DIAGNOSIS — M48061 Spinal stenosis, lumbar region without neurogenic claudication: Secondary | ICD-10-CM | POA: Diagnosis not present

## 2022-09-05 DIAGNOSIS — I509 Heart failure, unspecified: Secondary | ICD-10-CM | POA: Diagnosis not present

## 2022-09-05 DIAGNOSIS — M4726 Other spondylosis with radiculopathy, lumbar region: Secondary | ICD-10-CM | POA: Diagnosis not present

## 2022-09-05 DIAGNOSIS — E119 Type 2 diabetes mellitus without complications: Secondary | ICD-10-CM | POA: Diagnosis not present

## 2022-09-05 DIAGNOSIS — R338 Other retention of urine: Secondary | ICD-10-CM | POA: Diagnosis not present

## 2022-09-05 DIAGNOSIS — H9190 Unspecified hearing loss, unspecified ear: Secondary | ICD-10-CM | POA: Diagnosis not present

## 2022-09-05 DIAGNOSIS — I110 Hypertensive heart disease with heart failure: Secondary | ICD-10-CM | POA: Diagnosis not present

## 2022-09-05 DIAGNOSIS — M1990 Unspecified osteoarthritis, unspecified site: Secondary | ICD-10-CM | POA: Diagnosis not present

## 2022-09-06 ENCOUNTER — Telehealth: Payer: Self-pay | Admitting: Pharmacist

## 2022-09-06 ENCOUNTER — Other Ambulatory Visit: Payer: Self-pay

## 2022-09-06 LAB — URINE CULTURE
MICRO NUMBER:: 15206130
SPECIMEN QUALITY:: ADEQUATE

## 2022-09-06 MED ORDER — CEPHALEXIN 500 MG PO CAPS: 500.0000 mg | ORAL_CAPSULE | Freq: Two times a day (BID) | ORAL | 0 refills | Status: DC

## 2022-09-06 NOTE — Telephone Encounter (Signed)
Patient called to ask about results of her Urinalysis and culture. It looks like culture and sensitivity results have has just been release and reviewed. Dr Zola Button recommended Keflex 500mg  twice a day for  7 days. Rx was just sent by her nurse to [facility]. Patient aware.

## 2022-09-06 NOTE — Telephone Encounter (Signed)
Pt called back to advise that she is waiting on her antibiotic. She said one of the urine tests aren't back yet so she keeps calling. Advised there is not a lot the CMA can do if the lab results are not back yet from the second test. Pt would like a call back from CMA.

## 2022-09-06 NOTE — Telephone Encounter (Signed)
Sierra Ford called back to advise that she never heard back from lunch. I advised the CMA routed to provider after lunch. Sierra Ford said she has been Dr. Ernst Spell patient for years and has never been treated this way not being able to speak to anyone. Sierra Ford advised that the provider is looking at her the message now. Sierra Ford would like a call back today.

## 2022-09-07 DIAGNOSIS — Z4789 Encounter for other orthopedic aftercare: Secondary | ICD-10-CM | POA: Diagnosis not present

## 2022-09-07 DIAGNOSIS — E119 Type 2 diabetes mellitus without complications: Secondary | ICD-10-CM | POA: Diagnosis not present

## 2022-09-07 DIAGNOSIS — H9190 Unspecified hearing loss, unspecified ear: Secondary | ICD-10-CM | POA: Diagnosis not present

## 2022-09-07 DIAGNOSIS — M1990 Unspecified osteoarthritis, unspecified site: Secondary | ICD-10-CM | POA: Diagnosis not present

## 2022-09-07 DIAGNOSIS — R338 Other retention of urine: Secondary | ICD-10-CM | POA: Diagnosis not present

## 2022-09-07 DIAGNOSIS — I110 Hypertensive heart disease with heart failure: Secondary | ICD-10-CM | POA: Diagnosis not present

## 2022-09-07 DIAGNOSIS — M48061 Spinal stenosis, lumbar region without neurogenic claudication: Secondary | ICD-10-CM | POA: Diagnosis not present

## 2022-09-07 DIAGNOSIS — M4726 Other spondylosis with radiculopathy, lumbar region: Secondary | ICD-10-CM | POA: Diagnosis not present

## 2022-09-07 DIAGNOSIS — I509 Heart failure, unspecified: Secondary | ICD-10-CM | POA: Diagnosis not present

## 2022-09-10 DIAGNOSIS — M1990 Unspecified osteoarthritis, unspecified site: Secondary | ICD-10-CM | POA: Diagnosis not present

## 2022-09-10 DIAGNOSIS — I509 Heart failure, unspecified: Secondary | ICD-10-CM | POA: Diagnosis not present

## 2022-09-10 DIAGNOSIS — M48061 Spinal stenosis, lumbar region without neurogenic claudication: Secondary | ICD-10-CM | POA: Diagnosis not present

## 2022-09-10 DIAGNOSIS — I110 Hypertensive heart disease with heart failure: Secondary | ICD-10-CM | POA: Diagnosis not present

## 2022-09-10 DIAGNOSIS — Z4789 Encounter for other orthopedic aftercare: Secondary | ICD-10-CM | POA: Diagnosis not present

## 2022-09-10 DIAGNOSIS — M4726 Other spondylosis with radiculopathy, lumbar region: Secondary | ICD-10-CM | POA: Diagnosis not present

## 2022-09-10 DIAGNOSIS — E119 Type 2 diabetes mellitus without complications: Secondary | ICD-10-CM | POA: Diagnosis not present

## 2022-09-10 DIAGNOSIS — R338 Other retention of urine: Secondary | ICD-10-CM | POA: Diagnosis not present

## 2022-09-10 DIAGNOSIS — H9190 Unspecified hearing loss, unspecified ear: Secondary | ICD-10-CM | POA: Diagnosis not present

## 2022-09-11 DIAGNOSIS — R338 Other retention of urine: Secondary | ICD-10-CM | POA: Diagnosis not present

## 2022-09-11 DIAGNOSIS — I509 Heart failure, unspecified: Secondary | ICD-10-CM | POA: Diagnosis not present

## 2022-09-11 DIAGNOSIS — M4726 Other spondylosis with radiculopathy, lumbar region: Secondary | ICD-10-CM | POA: Diagnosis not present

## 2022-09-11 DIAGNOSIS — Z4789 Encounter for other orthopedic aftercare: Secondary | ICD-10-CM | POA: Diagnosis not present

## 2022-09-11 DIAGNOSIS — H9190 Unspecified hearing loss, unspecified ear: Secondary | ICD-10-CM | POA: Diagnosis not present

## 2022-09-11 DIAGNOSIS — I110 Hypertensive heart disease with heart failure: Secondary | ICD-10-CM | POA: Diagnosis not present

## 2022-09-11 DIAGNOSIS — E119 Type 2 diabetes mellitus without complications: Secondary | ICD-10-CM | POA: Diagnosis not present

## 2022-09-11 DIAGNOSIS — M48061 Spinal stenosis, lumbar region without neurogenic claudication: Secondary | ICD-10-CM | POA: Diagnosis not present

## 2022-09-11 DIAGNOSIS — M1990 Unspecified osteoarthritis, unspecified site: Secondary | ICD-10-CM | POA: Diagnosis not present

## 2022-09-12 ENCOUNTER — Telehealth: Payer: Self-pay | Admitting: Pharmacist

## 2022-09-12 ENCOUNTER — Other Ambulatory Visit: Payer: Self-pay

## 2022-09-12 ENCOUNTER — Ambulatory Visit (INDEPENDENT_AMBULATORY_CARE_PROVIDER_SITE_OTHER): Payer: Medicare PPO | Admitting: Pharmacist

## 2022-09-12 DIAGNOSIS — E1165 Type 2 diabetes mellitus with hyperglycemia: Secondary | ICD-10-CM

## 2022-09-12 DIAGNOSIS — Z4789 Encounter for other orthopedic aftercare: Secondary | ICD-10-CM | POA: Diagnosis not present

## 2022-09-12 DIAGNOSIS — E119 Type 2 diabetes mellitus without complications: Secondary | ICD-10-CM | POA: Diagnosis not present

## 2022-09-12 DIAGNOSIS — H9190 Unspecified hearing loss, unspecified ear: Secondary | ICD-10-CM | POA: Diagnosis not present

## 2022-09-12 DIAGNOSIS — M1990 Unspecified osteoarthritis, unspecified site: Secondary | ICD-10-CM | POA: Diagnosis not present

## 2022-09-12 DIAGNOSIS — M48061 Spinal stenosis, lumbar region without neurogenic claudication: Secondary | ICD-10-CM | POA: Diagnosis not present

## 2022-09-12 DIAGNOSIS — R338 Other retention of urine: Secondary | ICD-10-CM | POA: Diagnosis not present

## 2022-09-12 DIAGNOSIS — I509 Heart failure, unspecified: Secondary | ICD-10-CM | POA: Diagnosis not present

## 2022-09-12 DIAGNOSIS — M4726 Other spondylosis with radiculopathy, lumbar region: Secondary | ICD-10-CM | POA: Diagnosis not present

## 2022-09-12 DIAGNOSIS — I110 Hypertensive heart disease with heart failure: Secondary | ICD-10-CM | POA: Diagnosis not present

## 2022-09-12 MED ORDER — CIPROFLOXACIN HCL 500 MG PO TABS: 500.0000 mg | ORAL_TABLET | Freq: Two times a day (BID) | ORAL | 0 refills | Status: AC

## 2022-09-12 MED ORDER — SEMAGLUTIDE (1 MG/DOSE) 4 MG/3ML ~~LOC~~ SOPN
1.0000 mg | PEN_INJECTOR | SUBCUTANEOUS | Status: DC
Start: 2022-09-12 — End: 2023-03-06

## 2022-09-12 NOTE — Addendum Note (Signed)
Addended by: Henrene Pastor B on: 09/12/2022 03:18 PM   Modules accepted: Orders

## 2022-09-12 NOTE — Telephone Encounter (Signed)
Attempted to call patient x2. Phone went to busy signal both times

## 2022-09-12 NOTE — Telephone Encounter (Signed)
Left message on patient's VM about ciprofloxacin

## 2022-09-12 NOTE — Telephone Encounter (Signed)
Rx was sent in already 

## 2022-09-12 NOTE — Telephone Encounter (Signed)
Rx for ciprofloxacin 500mg  bid for 5 days send electronically to [facility] per Dr Maryln Gottron order. Patient notified.

## 2022-09-12 NOTE — Telephone Encounter (Signed)
Sierra Ford states she is still experiencing some pressure and burning when she urinates. She has 2 capsules left of cephalexin.  She is requesting a few more days of antibiotics.  Forwarding to PCP for review and recommendations

## 2022-09-12 NOTE — Progress Notes (Signed)
09/12/2022 Name: Sierra Ford MRN: [id number] DOB: 06-Feb-1941  Chief Complaint  Patient presents with   Medication Management   Diabetes    Sierra Ford is a 82 y.o. year old female who presented for a telephone visit.   They were referred to the pharmacist by their PCP for assistance in managing diabetes, medication access, and complex medication management.    Subjective:  Care Team: Primary Care Provider: Seabron Spates, DO; Next Scheduled Visit: not scheduled at this time Cardiologist: P; Next Scheduled Visit: recall scheduled for 04/2023 Pulmonology - Clent Ridges - follow up CPAP check.    Type 2 DM:  Current medications: Ozempic 1mg  weekly on Tuesdays.  Cost on her [employer] insurance is $250 / 28 days. Has a copay card from [employer] which lowers by $150 per month. She has also been using [employer] to off set cost but funding ran out earlier this year. She was not eligible to re-enroll until today.    Patient has 2 weeks of Ozempic 1mg  on hand.   Patient has glucometer at home but only checks blood glucose a few times per month. Blood glucose usually 114 to 150 Starting weight prior to Ozempic = 193lbs.  Last office weight = 174 lbs Total amount of weight loss = 19 lbs (had lost about 28 lbs)  BP Readings from Last 3 Encounters:  07/31/22 119/60  07/17/22 (!) 132/54  05/22/22 (!) 142/90   Edema:  She has been taking furosemide every day the last few weeks. Also taking potassium supplement with furosemide.  Report LEE has improved. [facility] nurse that visited today noted that edema was better.   UTI:  Patient has 1 or 2 doses of cephalexin left. She reports she is still feeling some pressure and burning when she urinates.   Objective:  Lab Results  Component Value Date   HGBA1C 6.3 (H) 07/17/2022    Lab Results  Component Value Date   CREATININE 0.76 07/17/2022   BUN 5 (L) 07/17/2022   NA 139 07/17/2022   K 3.9 07/17/2022   CL 105  07/17/2022   CO2 25 07/17/2022    Lab Results  Component Value Date   CHOL 188 03/22/2022   HDL 119.60 03/22/2022   LDLCALC 49 03/22/2022   LDLDIRECT 95.9 10/06/2012   TRIG 97.0 03/22/2022   CHOLHDL 2 03/22/2022    Medications Reviewed Today     Reviewed by Henrene Pastor, RPH-CPP (Pharmacist) on 09/12/22 at 1346  Med List Status: <None>   Medication Order Taking? Sig Documenting Provider Last Dose Status Informant  apixaban (ELIQUIS) 2.5 MG TABS tablet [id number] Yes Take 1 tablet (2.5 mg total) by mouth 2 (two) times daily. Rollene Rotunda, MD Taking Active Self  blood glucose meter kit and supplies KIT [id number] Yes Dispense based on patient and insurance preference. Use up to four times daily as directed. (FOR ICD-9 250.00, 250.01). Donato Schultz, DO Taking Active Self  Calcium Carb-Cholecalciferol (CALCIUM 600 + D PO) [id number] Yes Take 1 tablet by mouth daily. [provider] Taking Active Self  cephALEXin (KEFLEX) 500 MG capsule [id number] Yes Take 1 capsule (500 mg total) by mouth 2 (two) times daily. Seabron Spates R, DO Taking Active   cholecalciferol (VITAMIN D3) 25 MCG (1000 UNIT) tablet [id number] Yes Take 1,000 Units by mouth 2 (two) times daily. [provider] Taking Active Self  Coenzyme Q10 100 MG TABS [id number] Yes Take 100 mg by mouth daily. [provider]  Taking Active Self  diphenhydrAMINE HCl, Sleep, (ZZZQUIL) 25 MG CAPS [id number] Yes Take 25 mg by mouth at bedtime as needed (sleep). [provider] Taking Active Self  Evolocumab (REPATHA SURECLICK) 140 MG/ML SOAJ [id number] Yes Inject 140 mg into the skin every 14 (fourteen) days. Rollene Rotunda, MD Taking Active Self  Flaxseed, Linseed, (FLAXSEED OIL) 1200 MG CAPS [id number] Yes Take 1,200 mg by mouth daily. [provider] Taking Active Self  furosemide (LASIX) 40 MG tablet [id number] Yes TAKE 1 TABLET DAILY Hochrein, James, MD Taking Active   gabapentin  (NEURONTIN) 300 MG capsule [id number] Yes Take 300 mg by mouth at bedtime as needed (pain/sleep). [provider] Taking Active Self  Insulin Pen Needle (NOVOFINE PLUS PEN NEEDLE) 32G X 4 MM MISC [id number] Yes As directed Donato Schultz, DO Taking Active Self  methocarbamol (ROBAXIN-750) 750 MG tablet [id number] Yes Take 1 tablet (750 mg total) by mouth 4 (four) times daily.  Patient taking differently: Take 750 mg by mouth 4 (four) times daily as needed for muscle spasms.   Donato Schultz, DO Taking Active Self  Multiple Vitamin (MULTIVITAMIN) capsule [id number] Yes Take 1 capsule by mouth daily with breakfast. [provider] Taking Active Self    Discontinued 05/07/11 1124 (Discontinued by provider) Omega-3 Fatty Acids (FISH OIL) 1000 MG CAPS [id number] Yes Take 1,000 mg by mouth daily. [provider] Taking Active Self  OVER THE COUNTER MEDICATION [id number] Yes Apply 1 Application topically at bedtime. Nervive cream, apply to hands [provider] Taking Active Self  oxyCODONE (OXY IR/ROXICODONE) 5 MG immediate release tablet [id number] Yes Take 1 tablet (5 mg total) by mouth every 6 (six) hours as needed for moderate pain ((score 4 to 6)). Patrici Ranks [place], PA-C Taking Active   potassium chloride SA (KLOR-CON M) 20 MEQ tablet [id number] Yes Take 1 tablet (20 mEq total) by mouth daily.  Patient taking differently: Take 20 mEq by mouth daily as needed (when taking lasix).   Sharlene Dory, NP Taking Active Self    Discontinued 05/07/11 1124 (Change in therapy) rosuvastatin (CRESTOR) 10 MG tablet [id number] Yes TAKE 1 TABLET DAILY Rollene Rotunda, MD Taking Active   Semaglutide, 1 MG/DOSE, 4 MG/3ML SOPN [id number] Yes Inject 1 mg as directed once a week. Donato Schultz, DO Taking Active Self           Med Note Joella Prince A   Wed Jul 11, 2022  1:15 PM) Tuesdays   TRUE METRIX BLOOD GLUCOSE TEST test strip [id number] Yes TEST  UP  TO FOUR  TIMES DAILY AS DIRECTED Donato Schultz, DO Taking Active Self  TRUEPLUS LANCETS 33G MISC [id number] Yes TEST  UP TO FOUR TIMES DAILY AS DIRECTED Donato Schultz, DO Taking Active Self              Assessment/Plan:   Diabetes: - Currently controlled - Reviewed goal A1c, goal fasting, and goal 2 hour post prandial glucose - Recommend to continue Ozempic 1mg  weekly - left 1 sample pen of Ozempic = 4 doses.  (if cost becomes an issue later or we are unable to get samples, then could try metoformin ER 500mg ) - Recommend to check glucose once or twice per week.   Edema:  Continue furosemide and potassium supplement daily.   UTI:  Will send message to PCP about continued UTI symptoms.   Follow Up Plan: 4 weeks.   Henrene Pastor, PharmD Clinical Pharmacist [facility]  [facility] [employer]

## 2022-09-13 DIAGNOSIS — I509 Heart failure, unspecified: Secondary | ICD-10-CM | POA: Diagnosis not present

## 2022-09-13 DIAGNOSIS — M4726 Other spondylosis with radiculopathy, lumbar region: Secondary | ICD-10-CM | POA: Diagnosis not present

## 2022-09-13 DIAGNOSIS — M48061 Spinal stenosis, lumbar region without neurogenic claudication: Secondary | ICD-10-CM | POA: Diagnosis not present

## 2022-09-13 DIAGNOSIS — R338 Other retention of urine: Secondary | ICD-10-CM | POA: Diagnosis not present

## 2022-09-13 DIAGNOSIS — E119 Type 2 diabetes mellitus without complications: Secondary | ICD-10-CM | POA: Diagnosis not present

## 2022-09-13 DIAGNOSIS — Z4789 Encounter for other orthopedic aftercare: Secondary | ICD-10-CM | POA: Diagnosis not present

## 2022-09-13 DIAGNOSIS — M1990 Unspecified osteoarthritis, unspecified site: Secondary | ICD-10-CM | POA: Diagnosis not present

## 2022-09-13 DIAGNOSIS — H9190 Unspecified hearing loss, unspecified ear: Secondary | ICD-10-CM | POA: Diagnosis not present

## 2022-09-13 DIAGNOSIS — I110 Hypertensive heart disease with heart failure: Secondary | ICD-10-CM | POA: Diagnosis not present

## 2022-09-18 DIAGNOSIS — R338 Other retention of urine: Secondary | ICD-10-CM | POA: Diagnosis not present

## 2022-09-18 DIAGNOSIS — I509 Heart failure, unspecified: Secondary | ICD-10-CM | POA: Diagnosis not present

## 2022-09-18 DIAGNOSIS — I110 Hypertensive heart disease with heart failure: Secondary | ICD-10-CM | POA: Diagnosis not present

## 2022-09-18 DIAGNOSIS — H9190 Unspecified hearing loss, unspecified ear: Secondary | ICD-10-CM | POA: Diagnosis not present

## 2022-09-18 DIAGNOSIS — Z4789 Encounter for other orthopedic aftercare: Secondary | ICD-10-CM | POA: Diagnosis not present

## 2022-09-18 DIAGNOSIS — M1990 Unspecified osteoarthritis, unspecified site: Secondary | ICD-10-CM | POA: Diagnosis not present

## 2022-09-18 DIAGNOSIS — E119 Type 2 diabetes mellitus without complications: Secondary | ICD-10-CM | POA: Diagnosis not present

## 2022-09-18 DIAGNOSIS — M4726 Other spondylosis with radiculopathy, lumbar region: Secondary | ICD-10-CM | POA: Diagnosis not present

## 2022-09-18 DIAGNOSIS — M48061 Spinal stenosis, lumbar region without neurogenic claudication: Secondary | ICD-10-CM | POA: Diagnosis not present

## 2022-09-19 ENCOUNTER — Telehealth: Payer: Self-pay | Admitting: Family Medicine

## 2022-09-19 DIAGNOSIS — I509 Heart failure, unspecified: Secondary | ICD-10-CM | POA: Diagnosis not present

## 2022-09-19 DIAGNOSIS — E119 Type 2 diabetes mellitus without complications: Secondary | ICD-10-CM | POA: Diagnosis not present

## 2022-09-19 DIAGNOSIS — Z4789 Encounter for other orthopedic aftercare: Secondary | ICD-10-CM | POA: Diagnosis not present

## 2022-09-19 DIAGNOSIS — M4726 Other spondylosis with radiculopathy, lumbar region: Secondary | ICD-10-CM | POA: Diagnosis not present

## 2022-09-19 DIAGNOSIS — R338 Other retention of urine: Secondary | ICD-10-CM | POA: Diagnosis not present

## 2022-09-19 DIAGNOSIS — M1990 Unspecified osteoarthritis, unspecified site: Secondary | ICD-10-CM | POA: Diagnosis not present

## 2022-09-19 DIAGNOSIS — I110 Hypertensive heart disease with heart failure: Secondary | ICD-10-CM | POA: Diagnosis not present

## 2022-09-19 DIAGNOSIS — M48061 Spinal stenosis, lumbar region without neurogenic claudication: Secondary | ICD-10-CM | POA: Diagnosis not present

## 2022-09-19 DIAGNOSIS — H9190 Unspecified hearing loss, unspecified ear: Secondary | ICD-10-CM | POA: Diagnosis not present

## 2022-09-19 NOTE — Telephone Encounter (Signed)
Sierra Ford with [facility] called to follow up on order faxed to our office. Order is in [employer]. Please have provider review and sign and fax back.

## 2022-09-21 ENCOUNTER — Telehealth: Payer: Self-pay | Admitting: Family Medicine

## 2022-09-21 DIAGNOSIS — M48061 Spinal stenosis, lumbar region without neurogenic claudication: Secondary | ICD-10-CM | POA: Diagnosis not present

## 2022-09-21 DIAGNOSIS — R338 Other retention of urine: Secondary | ICD-10-CM | POA: Diagnosis not present

## 2022-09-21 DIAGNOSIS — M1990 Unspecified osteoarthritis, unspecified site: Secondary | ICD-10-CM | POA: Diagnosis not present

## 2022-09-21 DIAGNOSIS — H9190 Unspecified hearing loss, unspecified ear: Secondary | ICD-10-CM | POA: Diagnosis not present

## 2022-09-21 DIAGNOSIS — E119 Type 2 diabetes mellitus without complications: Secondary | ICD-10-CM | POA: Diagnosis not present

## 2022-09-21 DIAGNOSIS — I110 Hypertensive heart disease with heart failure: Secondary | ICD-10-CM | POA: Diagnosis not present

## 2022-09-21 DIAGNOSIS — M4726 Other spondylosis with radiculopathy, lumbar region: Secondary | ICD-10-CM | POA: Diagnosis not present

## 2022-09-21 DIAGNOSIS — Z4789 Encounter for other orthopedic aftercare: Secondary | ICD-10-CM | POA: Diagnosis not present

## 2022-09-21 DIAGNOSIS — I509 Heart failure, unspecified: Secondary | ICD-10-CM | POA: Diagnosis not present

## 2022-09-21 NOTE — Telephone Encounter (Signed)
Attempted to call patient x2. Phone line was busy.

## 2022-09-21 NOTE — Telephone Encounter (Signed)
Patient called and states she is having a UTI and can not come in office due to just having back surgery. She would like to know if anything can be called in.

## 2022-09-25 DIAGNOSIS — Z4789 Encounter for other orthopedic aftercare: Secondary | ICD-10-CM | POA: Diagnosis not present

## 2022-09-25 DIAGNOSIS — R338 Other retention of urine: Secondary | ICD-10-CM | POA: Diagnosis not present

## 2022-09-25 DIAGNOSIS — I110 Hypertensive heart disease with heart failure: Secondary | ICD-10-CM | POA: Diagnosis not present

## 2022-09-25 DIAGNOSIS — M4726 Other spondylosis with radiculopathy, lumbar region: Secondary | ICD-10-CM | POA: Diagnosis not present

## 2022-09-25 DIAGNOSIS — E119 Type 2 diabetes mellitus without complications: Secondary | ICD-10-CM | POA: Diagnosis not present

## 2022-09-25 DIAGNOSIS — M48061 Spinal stenosis, lumbar region without neurogenic claudication: Secondary | ICD-10-CM | POA: Diagnosis not present

## 2022-09-25 DIAGNOSIS — H9190 Unspecified hearing loss, unspecified ear: Secondary | ICD-10-CM | POA: Diagnosis not present

## 2022-09-25 DIAGNOSIS — M1990 Unspecified osteoarthritis, unspecified site: Secondary | ICD-10-CM | POA: Diagnosis not present

## 2022-09-25 DIAGNOSIS — I509 Heart failure, unspecified: Secondary | ICD-10-CM | POA: Diagnosis not present

## 2022-09-26 DIAGNOSIS — R338 Other retention of urine: Secondary | ICD-10-CM | POA: Diagnosis not present

## 2022-09-26 DIAGNOSIS — E119 Type 2 diabetes mellitus without complications: Secondary | ICD-10-CM | POA: Diagnosis not present

## 2022-09-26 DIAGNOSIS — M48061 Spinal stenosis, lumbar region without neurogenic claudication: Secondary | ICD-10-CM | POA: Diagnosis not present

## 2022-09-26 DIAGNOSIS — M4726 Other spondylosis with radiculopathy, lumbar region: Secondary | ICD-10-CM | POA: Diagnosis not present

## 2022-09-26 DIAGNOSIS — H9190 Unspecified hearing loss, unspecified ear: Secondary | ICD-10-CM | POA: Diagnosis not present

## 2022-09-26 DIAGNOSIS — Z4789 Encounter for other orthopedic aftercare: Secondary | ICD-10-CM | POA: Diagnosis not present

## 2022-09-26 DIAGNOSIS — M1990 Unspecified osteoarthritis, unspecified site: Secondary | ICD-10-CM | POA: Diagnosis not present

## 2022-09-26 DIAGNOSIS — I110 Hypertensive heart disease with heart failure: Secondary | ICD-10-CM | POA: Diagnosis not present

## 2022-09-26 DIAGNOSIS — I509 Heart failure, unspecified: Secondary | ICD-10-CM | POA: Diagnosis not present

## 2022-09-28 DIAGNOSIS — I509 Heart failure, unspecified: Secondary | ICD-10-CM | POA: Diagnosis not present

## 2022-09-28 DIAGNOSIS — I110 Hypertensive heart disease with heart failure: Secondary | ICD-10-CM | POA: Diagnosis not present

## 2022-09-28 DIAGNOSIS — Z4789 Encounter for other orthopedic aftercare: Secondary | ICD-10-CM | POA: Diagnosis not present

## 2022-09-28 DIAGNOSIS — M4726 Other spondylosis with radiculopathy, lumbar region: Secondary | ICD-10-CM | POA: Diagnosis not present

## 2022-09-28 DIAGNOSIS — M1990 Unspecified osteoarthritis, unspecified site: Secondary | ICD-10-CM | POA: Diagnosis not present

## 2022-09-28 DIAGNOSIS — H9190 Unspecified hearing loss, unspecified ear: Secondary | ICD-10-CM | POA: Diagnosis not present

## 2022-09-28 DIAGNOSIS — R338 Other retention of urine: Secondary | ICD-10-CM | POA: Diagnosis not present

## 2022-09-28 DIAGNOSIS — E119 Type 2 diabetes mellitus without complications: Secondary | ICD-10-CM | POA: Diagnosis not present

## 2022-09-28 DIAGNOSIS — M48061 Spinal stenosis, lumbar region without neurogenic claudication: Secondary | ICD-10-CM | POA: Diagnosis not present

## 2022-10-03 DIAGNOSIS — I110 Hypertensive heart disease with heart failure: Secondary | ICD-10-CM | POA: Diagnosis not present

## 2022-10-03 DIAGNOSIS — M48061 Spinal stenosis, lumbar region without neurogenic claudication: Secondary | ICD-10-CM | POA: Diagnosis not present

## 2022-10-03 DIAGNOSIS — E119 Type 2 diabetes mellitus without complications: Secondary | ICD-10-CM | POA: Diagnosis not present

## 2022-10-03 DIAGNOSIS — H9190 Unspecified hearing loss, unspecified ear: Secondary | ICD-10-CM | POA: Diagnosis not present

## 2022-10-03 DIAGNOSIS — M4726 Other spondylosis with radiculopathy, lumbar region: Secondary | ICD-10-CM | POA: Diagnosis not present

## 2022-10-03 DIAGNOSIS — M1990 Unspecified osteoarthritis, unspecified site: Secondary | ICD-10-CM | POA: Diagnosis not present

## 2022-10-03 DIAGNOSIS — I509 Heart failure, unspecified: Secondary | ICD-10-CM | POA: Diagnosis not present

## 2022-10-03 DIAGNOSIS — R338 Other retention of urine: Secondary | ICD-10-CM | POA: Diagnosis not present

## 2022-10-03 DIAGNOSIS — Z4789 Encounter for other orthopedic aftercare: Secondary | ICD-10-CM | POA: Diagnosis not present

## 2022-10-03 NOTE — Telephone Encounter (Signed)
Sierra Ford from [facility] calling to follow up on orders that were faxed to [name] on 09/26/22. Order # [id number]. Please refax  [phone]

## 2022-10-10 DIAGNOSIS — I110 Hypertensive heart disease with heart failure: Secondary | ICD-10-CM | POA: Diagnosis not present

## 2022-10-10 DIAGNOSIS — E119 Type 2 diabetes mellitus without complications: Secondary | ICD-10-CM | POA: Diagnosis not present

## 2022-10-10 DIAGNOSIS — I509 Heart failure, unspecified: Secondary | ICD-10-CM | POA: Diagnosis not present

## 2022-10-10 DIAGNOSIS — M4726 Other spondylosis with radiculopathy, lumbar region: Secondary | ICD-10-CM | POA: Diagnosis not present

## 2022-10-10 DIAGNOSIS — H9190 Unspecified hearing loss, unspecified ear: Secondary | ICD-10-CM | POA: Diagnosis not present

## 2022-10-10 DIAGNOSIS — M1990 Unspecified osteoarthritis, unspecified site: Secondary | ICD-10-CM | POA: Diagnosis not present

## 2022-10-10 DIAGNOSIS — Z4789 Encounter for other orthopedic aftercare: Secondary | ICD-10-CM | POA: Diagnosis not present

## 2022-10-10 DIAGNOSIS — R338 Other retention of urine: Secondary | ICD-10-CM | POA: Diagnosis not present

## 2022-10-10 DIAGNOSIS — M48061 Spinal stenosis, lumbar region without neurogenic claudication: Secondary | ICD-10-CM | POA: Diagnosis not present

## 2022-10-15 ENCOUNTER — Telehealth: Payer: Self-pay | Admitting: Cardiology

## 2022-10-15 ENCOUNTER — Other Ambulatory Visit (HOSPITAL_COMMUNITY): Payer: Self-pay

## 2022-10-15 NOTE — Telephone Encounter (Signed)
Pharmacy Patient Advocate Encounter   Received notification from Physician's Office that prior authorization for REPATHA  is required/requested.   Insurance verification completed.   The patient is insured through  [employer]  .   Per test claim: PA required; PA submitted to [employer] via CoverMyMeds Key/confirmation #/EOC Key: [id number]    Status is pending

## 2022-10-15 NOTE — Telephone Encounter (Signed)
Pt c/o medication issue:  1. Name of Medication: Repatha  2. How are you currently taking this medication (dosage and times per day)?   3. Are you having a reaction (difficulty breathing--STAT)?   4. What is your medication issue?   Patient stated she received a confirmation letter date 8/13 from [facility], [address] [place], [place] [address] approving her getting this medication.  Auth# [id number].  Patient noted the letter noted this authorization will expire in 60 days.

## 2022-10-15 NOTE — Telephone Encounter (Signed)
Patient states she received letter for authorization for Repatha that will expire in 60 days, to continue after this date, please have prescriber update with [facility]. Please do PA for this medication thank you

## 2022-10-16 ENCOUNTER — Telehealth: Payer: Self-pay | Admitting: Family Medicine

## 2022-10-16 NOTE — Telephone Encounter (Signed)
Patient called to see if we had a sample for Ozempic - I am not in office today so will check tomorrow. Patient states she took last dose today. Next dose due 10/23/2022.

## 2022-10-16 NOTE — Telephone Encounter (Signed)
Pt would like to discuss her ozempic with Tammy. Please advise.

## 2022-10-17 ENCOUNTER — Other Ambulatory Visit (HOSPITAL_COMMUNITY): Payer: Self-pay

## 2022-10-17 DIAGNOSIS — I110 Hypertensive heart disease with heart failure: Secondary | ICD-10-CM | POA: Diagnosis not present

## 2022-10-17 DIAGNOSIS — M48061 Spinal stenosis, lumbar region without neurogenic claudication: Secondary | ICD-10-CM | POA: Diagnosis not present

## 2022-10-17 DIAGNOSIS — E119 Type 2 diabetes mellitus without complications: Secondary | ICD-10-CM | POA: Diagnosis not present

## 2022-10-17 DIAGNOSIS — M4726 Other spondylosis with radiculopathy, lumbar region: Secondary | ICD-10-CM | POA: Diagnosis not present

## 2022-10-17 DIAGNOSIS — M1990 Unspecified osteoarthritis, unspecified site: Secondary | ICD-10-CM | POA: Diagnosis not present

## 2022-10-17 DIAGNOSIS — R338 Other retention of urine: Secondary | ICD-10-CM | POA: Diagnosis not present

## 2022-10-17 DIAGNOSIS — I509 Heart failure, unspecified: Secondary | ICD-10-CM | POA: Diagnosis not present

## 2022-10-17 DIAGNOSIS — Z4789 Encounter for other orthopedic aftercare: Secondary | ICD-10-CM | POA: Diagnosis not present

## 2022-10-17 DIAGNOSIS — H9190 Unspecified hearing loss, unspecified ear: Secondary | ICD-10-CM | POA: Diagnosis not present

## 2022-10-17 NOTE — Telephone Encounter (Signed)
Spoke with patient. The [employer] notification was actually for the hypercholesterolemia fund and not Type 2 DM. I was able to get patient a sample - Left # 1 pen of Ozempic 0.25/0.5mg  - she will need to injection 0.5mg  twice to get 1mg  - still inject once weekly.

## 2022-10-17 NOTE — Telephone Encounter (Signed)
Pt called back to check on ozempic. Also wanted to advise Tammy that the [employer] fund is open again.

## 2022-10-19 ENCOUNTER — Encounter: Payer: Self-pay | Admitting: Family Medicine

## 2022-10-19 ENCOUNTER — Ambulatory Visit (INDEPENDENT_AMBULATORY_CARE_PROVIDER_SITE_OTHER): Payer: Medicare PPO | Admitting: Family Medicine

## 2022-10-19 VITALS — BP 116/78 | HR 88 | Temp 98.8°F | Resp 18 | Ht 62.0 in | Wt 159.0 lb

## 2022-10-19 DIAGNOSIS — Z23 Encounter for immunization: Secondary | ICD-10-CM | POA: Diagnosis not present

## 2022-10-19 DIAGNOSIS — Z20822 Contact with and (suspected) exposure to covid-19: Secondary | ICD-10-CM | POA: Diagnosis not present

## 2022-10-19 LAB — POC COVID19 BINAXNOW: SARS Coronavirus 2 Ag: NEGATIVE

## 2022-10-19 NOTE — Progress Notes (Signed)
Established Patient Office Visit  Subjective   Patient ID: Sierra Ford, female    DOB: 1940/10/23  Age: 82 y.o. MRN: [id number]  Chief Complaint  Patient presents with   Nasal Congestion    X2 days, no cough, sore throat. Pt states having exposure.     HPI Discussed the use of AI scribe software for clinical note transcription with the patient, who gave verbal consent to proceed.  History of Present Illness   The patient, with a history of back pain, underwent back surgery on June 4th. The surgery was performed by Dr. Hoyt Koch at [facility]. Post-operatively, the patient experienced significant complications, including an inability to move her left leg. She was hospitalized for seven days and then transferred to a rehabilitation facility for 21 days. Despite ongoing physical and occupational therapy, the patient continues to struggle with mobility and is still reliant on a walker. She has seen Dr. Maisie Fus twice since the surgery, but her next appointment is not until October. The patient is frustrated with her slow recovery and is concerned about her ability to walk independently again.  In addition to her mobility issues, the patient has been experiencing a runny nose, a frog in her throat, and increased bowel movements. She recently attended a housewarming party where another guest later tested positive for COVID-19. The patient has tested negative for COVID-19 at the clinic, but she is concerned about her symptoms. She also reports a loss of appetite and weight loss, which she attributes to her current health issues.  The patient also mentions a history of carpal tunnel syndrome, which has been exacerbated by her use of a walker. She is unable to have surgery for this condition due to her current mobility issues.      Patient Active Problem List   Diagnosis Date Noted   Lumbar stenosis 07/24/2022   Other spondylosis with radiculopathy, lumbar region 07/24/2022    Chronic pain of both shoulders 05/24/2022   Pain of lower extremity 05/02/2022   Uncontrolled type 2 diabetes mellitus with hyperglycemia (HCC) 03/09/2022   Acute left-sided low back pain with sciatica 03/09/2022   Former smoker 03/09/2022   Primary osteoarthritis of left hip 02/20/2022   Spinal stenosis of lumbar region 02/20/2022   Rotator cuff arthropathy of both shoulders 02/20/2022   Lower abdominal pain 04/04/2021   Rectal bleeding 04/04/2021   Change in bowel habits 04/04/2021   Internal hemorrhoids 04/04/2021   Preventative health care 03/07/2021   Sacroiliitis (HCC) 03/07/2021   Morbid (severe) obesity due to excess calories (HCC) 03/07/2021   Bronchitis 12/20/2020   Other pulmonary embolism with acute cor pulmonale, unspecified chronicity (HCC)    Educated about COVID-19 virus infection 01/26/2019   Hyperlipidemia associated with type 2 diabetes mellitus (HCC) 04/24/2018   Right ankle swelling 05/13/2017   Diabetes mellitus, type II (HCC) 04/08/2017   Primary hypertension 04/08/2017   Atherosclerosis of aorta (HCC) 04/08/2017   Abnormal auditory perception of both ears 10/01/2016   Bilateral impacted cerumen 10/01/2016   OSA (obstructive sleep apnea) 12/16/2015   DOE (dyspnea on exertion) 06/12/2013   Obesity (BMI 30-39.9) 04/24/2013   Chest pain, unspecified 01/22/2011   Abnormal stress test 01/22/2011   Frequent urination 01/22/2011   Chronic diastolic heart failure (HCC) 05/15/2009   CAROTID ARTERY DISEASE 01/12/2009   LEG EDEMA, RIGHT 07/27/2008   HX, URINARY INFECTION 09/19/2006   Hyperlipidemia LDL goal <70 08/20/2006   PREGNANCY, ECTOPIC NEC W/INTRAUTERINE PRG 08/20/2006   FREQUENCY,  URINARY 08/20/2006   Other specified abnormal findings of blood chemistry 08/20/2006   Personal history of venous thrombosis and embolism 08/20/2006   Past Medical History:  Diagnosis Date   Anxiety    Back pain    Chest pain    CLite with apical ischemia in 2006 - normal  coronary arteries by [facility] in 12/2004;  Myoview 11/12:  Low risk stress nuclear study with a small, partially reversible apical defect most likely related to apical thinning; cannot R/O very mild apical ischemia.  EF: 75%    Complication of anesthesia    trouble waking after colonoscopy slept all day afterward into the next morning   Decreased hearing    Depression    Diabetes (HCC)    type II   DVT (deep venous thrombosis) (HCC) 2006   hx of and again in 2020   Ectopic pregnancy with intrauterine pregnancy    Hiatal hernia    patient not aware of hernia   Hyperlipidemia    PE (pulmonary embolism) 2006   hx of and again in 2020   Sleep apnea    CPAP    Past Surgical History:  Procedure Laterality Date   ABDOMINAL EXPLORATION SURGERY     ANTERIOR LAT LUMBAR FUSION N/A 07/24/2022   Procedure: Direct Lumbar Interbody Fusion PRONE TRANSPSOAS Lumbar three-four, Lumbar four-five;  Surgeon: Bedelia Person, MD;  Location: [facility] OR;  Service: Neurosurgery;  Laterality: N/A;   BUNIONECTOMY Right 1985   right   COLONOSCOPY  2009   every 5 years   ECTOPIC PREGNANCY SURGERY     FOOT SURGERY Right 1985   LUMBAR PERCUTANEOUS PEDICLE SCREW 2 LEVEL N/A 07/24/2022   Procedure: LUMBAR PERCUTANEOUS PEDICLE SCREW LUMBAR THREE-FOUR, LUMBAR FOUR-FIVE;  Surgeon: Bedelia Person, MD;  Location: [facility] OR;  Service: Neurosurgery;  Laterality: N/A;   TUBAL LIGATION     Social History   Tobacco Use   Smoking status: Former    Current packs/day: 0.00    Average packs/day: 1 pack/day for 50.0 years (50.0 ttl pk-yrs)    Types: Cigarettes    Start date: 11/22/1959    Quit date: 11/21/2009    Years since quitting: 12.9   Smokeless tobacco: Never  Vaping Use   Vaping status: Never Used  Substance Use Topics   Alcohol use: Yes    Comment: rare 3 times a year per pt   Drug use: No   Social History   Socioeconomic History   Marital status: Divorced    Spouse name: Not on file   Number of children: 1    Years of education: Not on file   Highest education level: Not on file  Occupational History   Occupation: Retired Research scientist (physical sciences)  Tobacco Use   Smoking status: Former    Current packs/day: 0.00    Average packs/day: 1 pack/day for 50.0 years (50.0 ttl pk-yrs)    Types: Cigarettes    Start date: 11/22/1959    Quit date: 11/21/2009    Years since quitting: 12.9   Smokeless tobacco: Never  Vaping Use   Vaping status: Never Used  Substance and Sexual Activity   Alcohol use: Yes    Comment: rare 3 times a year per pt   Drug use: No   Sexual activity: Not Currently    Partners: Male  Other Topics Concern   Not on file  Social History Narrative   Regular exercise--- no   Social Determinants of Health   Financial Resource Strain:  Low Risk  (08/27/2022)   Overall Financial Resource Strain (CARDIA)    Difficulty of Paying Living Expenses: Not hard at all  Food Insecurity: No Food Insecurity (07/24/2022)   Hunger Vital Sign    Worried About Running Out of Food in the Last Year: Never true    Ran Out of Food in the Last Year: Never true  Transportation Needs: No Transportation Needs (07/24/2022)   PRAPARE - Administrator, Civil Service (Medical): No    Lack of Transportation (Non-Medical): No  Physical Activity: Inactive (08/27/2022)   Exercise Vital Sign    Days of Exercise per Week: 0 days    Minutes of Exercise per Session: 0 min  Stress: No Stress Concern Present (08/15/2020)   [employer] of Occupational Health - Occupational Stress Questionnaire    Feeling of Stress : Only a little  Social Connections: Moderately Integrated (08/15/2020)   Social Connection and Isolation Panel [NHANES]    Frequency of Communication with Friends and Family: More than three times a week    Frequency of Social Gatherings with Friends and Family: More than three times a week    Attends Religious Services: More than 4 times per year    Active Member of [employer] or Organizations: Yes     Attends Banker Meetings: 1 to 4 times per year    Marital Status: Widowed  Intimate Partner Violence: Not At Risk (07/24/2022)   Humiliation, Afraid, Rape, and Kick questionnaire    Fear of Current or Ex-Partner: No    Emotionally Abused: No    Physically Abused: No    Sexually Abused: No   Family Status  Relation Name Status   Mother  Deceased at age 1       heart failure   Father  Deceased       unknown causes   Mat Aunt  (Not Specified)   Mat Aunt  (Not Specified)   MGM  Deceased   MGF  Deceased   PGM  Deceased   PGF  Deceased   Neg Hx  (Not Specified)  No partnership data on file   Family History  Problem Relation Age of Onset   Heart failure Mother        died from   Colon polyps Mother    Hypertension Mother    Sudden death Mother    Obesity Mother    Alcoholism Father    Diabetes Maternal Aunt        grandaughter   Diabetes Maternal Aunt    Colon cancer Neg Hx    Esophageal cancer Neg Hx    Stomach cancer Neg Hx    No Known Allergies    Review of Systems  Constitutional:  Negative for fever and malaise/fatigue.  HENT:  Positive for congestion.   Eyes:  Negative for blurred vision.  Respiratory:  Negative for cough and shortness of breath.   Cardiovascular:  Negative for chest pain, palpitations and leg swelling.  Gastrointestinal:  Negative for abdominal pain, blood in stool and nausea.  Genitourinary:  Negative for dysuria and frequency.  Musculoskeletal:  Negative for falls.  Skin:  Negative for rash.  Neurological:  Negative for dizziness, loss of consciousness and headaches.  Endo/Heme/Allergies:  Negative for environmental allergies.  Psychiatric/Behavioral:  Negative for depression. The patient is not nervous/anxious.       Objective:     BP 116/78 (BP Location: Left Arm, Patient Position: Sitting, Cuff Size: Normal)   Pulse 88  Temp 98.8 F (37.1 C) (Oral)   Resp 18   Ht 5\' 2"  (1.575 m)   Wt 159 lb (72.1 kg)   SpO2 98%    BMI 29.08 kg/m  BP Readings from Last 3 Encounters:  10/19/22 116/78  07/31/22 119/60  07/17/22 (!) 132/54   Wt Readings from Last 3 Encounters:  10/19/22 159 lb (72.1 kg)  07/24/22 165 lb (74.8 kg)  07/17/22 168 lb 8 oz (76.4 kg)   SpO2 Readings from Last 3 Encounters:  10/19/22 98%  07/31/22 97%  07/17/22 97%      Physical Exam Vitals and nursing note reviewed.  Constitutional:      General: She is not in acute distress.    Appearance: Normal appearance. She is well-developed.  HENT:     Head: Normocephalic and atraumatic.     Right Ear: Tympanic membrane, ear canal and external ear normal. There is no impacted cerumen.     Left Ear: Tympanic membrane, ear canal and external ear normal. There is no impacted cerumen.     Nose: Rhinorrhea present.     Mouth/Throat:     Mouth: Mucous membranes are moist.     Pharynx: Oropharynx is clear. No oropharyngeal exudate or posterior oropharyngeal erythema.  Eyes:     General: No scleral icterus.       Right eye: No discharge.        Left eye: No discharge.     Conjunctiva/sclera: Conjunctivae normal.     Pupils: Pupils are equal, round, and reactive to light.  Neck:     Thyroid: No thyromegaly or thyroid tenderness.     Vascular: No JVD.  Cardiovascular:     Rate and Rhythm: Normal rate and regular rhythm.     Heart sounds: Normal heart sounds. No murmur heard. Pulmonary:     Effort: Pulmonary effort is normal. No respiratory distress.     Breath sounds: Normal breath sounds.  Abdominal:     General: Bowel sounds are normal. There is no distension.     Palpations: Abdomen is soft. There is no mass.     Tenderness: There is no abdominal tenderness. There is no guarding or rebound.  Genitourinary:    Vagina: Normal.  Musculoskeletal:        General: Normal range of motion.     Cervical back: Normal range of motion and neck supple.     Right lower leg: No edema.     Left lower leg: No edema.  Lymphadenopathy:      Cervical: No cervical adenopathy.  Skin:    General: Skin is warm and dry.     Findings: No erythema or rash.  Neurological:     Mental Status: She is alert and oriented to person, place, and time.     Cranial Nerves: No cranial nerve deficit.     Deep Tendon Reflexes: Reflexes are normal and symmetric.  Psychiatric:        Mood and Affect: Mood normal.        Behavior: Behavior normal.        Thought Content: Thought content normal.        Judgment: Judgment normal.      Results for orders placed or performed in visit on 10/19/22  POC COVID-19  Result Value Ref Range   SARS Coronavirus 2 Ag Negative Negative      The ASCVD Risk score (Arnett DK, et al., 2019) failed to calculate for the following reasons:   The  2019 ASCVD risk score is only valid for ages 18 to 49    Assessment & Plan:   Problem List Items Addressed This Visit   None Visit Diagnoses     Close exposure to COVID-19 virus    -  Primary   Relevant Orders   POC COVID-19 (Completed)   Need for influenza vaccination       Relevant Orders   Flu Vaccine Trivalent High Dose (Fluad)     Assessment and Plan    Post-operative Complication from Back Surgery Patient underwent back surgery on June 4th with Dr. Hoyt Koch and has since experienced left leg immobility. Patient has been in physical therapy since the surgery and has seen some improvement. -Continue physical therapy. -If no further improvement in mobility, patient should contact Dr. Maisie Fus for further evaluation.  Carpal Tunnel Syndrome Exacerbated by use of walker due to post-operative mobility issues. -Postpone any surgical intervention until patient regains mobility.  Upper Respiratory Symptoms Patient reports runny nose and throat discomfort. COVID-19 test conducted in office was negative. -Advise patient to use over-the-counter antihistamines and nasal spray for symptom relief. -If symptoms persist or worsen, patient should conduct a  home COVID-19 test and contact the office for further guidance.  Hearing Difficulty Patient reports difficulty hearing, especially in one ear. -Advise patient to use Debrox drops to help with wax buildup. -If hearing does not improve, patient should return for further evaluation.  General Health Maintenance -Administered flu shot during visit. -Advise patient to increase fiber intake or consider a probiotic for increased bowel movements.        No follow-ups on file.    Donato Schultz, DO

## 2022-10-24 ENCOUNTER — Telehealth: Payer: Self-pay | Admitting: Cardiology

## 2022-10-24 ENCOUNTER — Encounter: Payer: Self-pay | Admitting: Pharmacist

## 2022-10-24 ENCOUNTER — Telehealth: Payer: Self-pay

## 2022-10-24 ENCOUNTER — Telehealth: Payer: Self-pay | Admitting: Family Medicine

## 2022-10-24 DIAGNOSIS — R338 Other retention of urine: Secondary | ICD-10-CM | POA: Diagnosis not present

## 2022-10-24 DIAGNOSIS — M1990 Unspecified osteoarthritis, unspecified site: Secondary | ICD-10-CM | POA: Diagnosis not present

## 2022-10-24 DIAGNOSIS — E119 Type 2 diabetes mellitus without complications: Secondary | ICD-10-CM | POA: Diagnosis not present

## 2022-10-24 DIAGNOSIS — Z4789 Encounter for other orthopedic aftercare: Secondary | ICD-10-CM | POA: Diagnosis not present

## 2022-10-24 DIAGNOSIS — E785 Hyperlipidemia, unspecified: Secondary | ICD-10-CM

## 2022-10-24 DIAGNOSIS — H9190 Unspecified hearing loss, unspecified ear: Secondary | ICD-10-CM | POA: Diagnosis not present

## 2022-10-24 DIAGNOSIS — M4726 Other spondylosis with radiculopathy, lumbar region: Secondary | ICD-10-CM | POA: Diagnosis not present

## 2022-10-24 DIAGNOSIS — I509 Heart failure, unspecified: Secondary | ICD-10-CM | POA: Diagnosis not present

## 2022-10-24 DIAGNOSIS — I110 Hypertensive heart disease with heart failure: Secondary | ICD-10-CM | POA: Diagnosis not present

## 2022-10-24 DIAGNOSIS — M48061 Spinal stenosis, lumbar region without neurogenic claudication: Secondary | ICD-10-CM | POA: Diagnosis not present

## 2022-10-24 NOTE — Telephone Encounter (Signed)
Pt c/o medication issue:  1. Name of Medication:   Evolocumab (REPATHA SURECLICK) 140 MG/ML SOAJ    2. How are you currently taking this medication (dosage and times per day)? Inject 140 mg into the skin every 14 (fourteen) days.   3. Are you having a reaction (difficulty breathing--STAT)? No  4. What is your medication issue? Patient was calling because she received a denial from her insurance for the PA. Patient would like to know the next plan of action. Patient requested if she does not answer her cell phone, then to call her home phone.

## 2022-10-24 NOTE — Telephone Encounter (Signed)
[facility]) called to relay the following info:   Pt reported having loose stools for the last week Covid test was negative  Discharging from [facility] PT but pt need a referral to Outpatient PT at [facility] PT @ [address] [place], [place], [place] [address] - P: [phone]

## 2022-10-24 NOTE — Telephone Encounter (Signed)
We only have 1 phone number listed and it's not in service. I previously sent patient a mychart message requesting follow up about this. Do you know what phone number she called in from?

## 2022-10-24 NOTE — Telephone Encounter (Signed)
Pharmacy Patient Advocate Encounter  Received notification from  [facility]  that Prior Authorization for Repatha has been DENIED.  Full denial letter will be uploaded to the media tab. See denial reason below.    (The patient plan requires LDL-C LABS FROM WITHIN THE LAST )

## 2022-10-24 NOTE — Telephone Encounter (Signed)
Annoying insurance requirement that they won't accept her labs from 7 months ago. Pt's phone # is not in service. I have sent her a mychart message asking her to come in for repeat labs.

## 2022-10-24 NOTE — Telephone Encounter (Signed)
  (  The patient plan requires LDL-C LABS FROM WITHIN THE LAST )

## 2022-10-25 ENCOUNTER — Other Ambulatory Visit: Payer: Self-pay

## 2022-10-25 DIAGNOSIS — M48061 Spinal stenosis, lumbar region without neurogenic claudication: Secondary | ICD-10-CM

## 2022-10-25 NOTE — Telephone Encounter (Signed)
I called pt back and left a detailed voicemail.

## 2022-10-25 NOTE — Telephone Encounter (Signed)
Referral placed.

## 2022-10-25 NOTE — Telephone Encounter (Signed)
Patient called back, FLP scheduled at [facility] lab for 10/26/2022

## 2022-10-25 NOTE — Telephone Encounter (Signed)
Yes, she called from the [phone].

## 2022-10-26 ENCOUNTER — Ambulatory Visit: Payer: Medicare PPO | Attending: Family Medicine

## 2022-10-26 DIAGNOSIS — E785 Hyperlipidemia, unspecified: Secondary | ICD-10-CM | POA: Diagnosis not present

## 2022-10-27 LAB — LIPID PANEL
Chol/HDL Ratio: 1.5 ratio (ref 0.0–4.4)
Cholesterol, Total: 187 mg/dL (ref 100–199)
HDL: 126 mg/dL (ref >39)
LDL Chol Calc (NIH): 46 mg/dL (ref 0–99)
Triglycerides: 83 mg/dL (ref 0–149)
VLDL Cholesterol Cal: 15 mg/dL (ref 5–40)

## 2022-10-29 NOTE — Telephone Encounter (Signed)
Updated lipid panel has been checked for Repatha reauthorization.

## 2022-10-30 ENCOUNTER — Other Ambulatory Visit: Payer: Self-pay | Admitting: Family Medicine

## 2022-10-30 ENCOUNTER — Telehealth: Payer: Self-pay | Admitting: Family Medicine

## 2022-10-30 DIAGNOSIS — M48061 Spinal stenosis, lumbar region without neurogenic claudication: Secondary | ICD-10-CM

## 2022-10-30 MED ORDER — METHOCARBAMOL 500 MG PO TABS
500.0000 mg | ORAL_TABLET | Freq: Four times a day (QID) | ORAL | 3 refills | Status: DC
Start: 2022-10-30 — End: 2023-06-20

## 2022-10-30 NOTE — Telephone Encounter (Signed)
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 Pt needs it sent to [facility] on [address]. Not sure that this needs to be sent to Tammy Eckard so will route to PCP to fill prescription. Routing to pharmacist as well in case she wants to follow up with patient.

## 2022-10-30 NOTE — Telephone Encounter (Signed)
Just FYI.  It does look like last Rx was for 500mg  methocarbamol but was written by other provider in July

## 2022-10-31 DIAGNOSIS — M48061 Spinal stenosis, lumbar region without neurogenic claudication: Secondary | ICD-10-CM | POA: Diagnosis not present

## 2022-10-31 DIAGNOSIS — M545 Low back pain, unspecified: Secondary | ICD-10-CM | POA: Diagnosis not present

## 2022-10-31 NOTE — Telephone Encounter (Signed)
Noted. Pt called. LVM advising refill

## 2022-11-01 ENCOUNTER — Telehealth: Payer: Self-pay | Admitting: Pharmacist

## 2022-11-01 ENCOUNTER — Telehealth: Payer: Self-pay | Admitting: Pharmacy Technician

## 2022-11-01 NOTE — Telephone Encounter (Signed)
Pharmacy Patient Advocate Encounter   Received notification from Pt Calls Messages that prior authorization for repatha is required/requested.   Insurance verification completed.   The patient is insured through  [employer]  .   Per test claim: PA required; PA submitted to [employer] via Fax Key/confirmation #/EOC [id number] Status is pending

## 2022-11-01 NOTE — Telephone Encounter (Signed)
PA request has been Submitted with labs. New Encounter created for follow up. For additional info see Pharmacy Prior Auth telephone encounter from 11/01/22.

## 2022-11-01 NOTE — Telephone Encounter (Signed)
Patient called to ask about methocarbamol prescription and Ozempic.  Rx for methocarbamol was sent to [facility] 10/30/2022 - patient to check with pharmacy.   Patient has not been able to afford cost of Ozempic at $250 / month on her [facility] pharmacy benefits. She could use discount care but only lowers cost by $150 / month which would still be $100 / month. Per patient she cannot afford this cost.  Previoulsy we were able to enroll her in [place] fund for Type 2 DM but that ended 07/2022 and current this fund is closed / has no funding.  Will check to see if we have a sample to help with cost.

## 2022-11-01 NOTE — Telephone Encounter (Signed)
Pharmacy Patient Advocate Encounter   Received notification from Pt Calls Messages that prior authorization for repatha is required/requested.   Insurance verification completed.   The patient is insured through  [employer]  .   Per test claim: PA required; PA started via CoverMyMeds. KEY [id number] . Waiting for clinical questions to populate.

## 2022-11-02 ENCOUNTER — Other Ambulatory Visit (HOSPITAL_COMMUNITY): Payer: Self-pay

## 2022-11-02 MED ORDER — REPATHA SURECLICK 140 MG/ML ~~LOC~~ SOAJ: 1.0000 mL | SUBCUTANEOUS | 11 refills | Status: DC

## 2022-11-02 NOTE — Telephone Encounter (Signed)
Pharmacy Patient Advocate Encounter  Received notification from  [employer]  that Prior Authorization for REPATHA has been APPROVED from 11/02/22 to 11/02/23

## 2022-11-02 NOTE — Addendum Note (Signed)
Addended by: Mammie Meras E on: 11/02/2022 01:57 PM   Modules accepted: Orders

## 2022-11-02 NOTE — Telephone Encounter (Signed)
Refill sent in, left detailed voicemail for pt with approval.

## 2022-11-05 NOTE — Telephone Encounter (Signed)
LM on VM that I left a sample of Ozempic 0.25/0.5mg  dose pen. She will inject 0.5mg  x 2 to equal 1mg  once weekly.

## 2022-11-06 ENCOUNTER — Telehealth: Payer: Self-pay | Admitting: Pharmacist

## 2022-11-06 MED ORDER — REPATHA SURECLICK 140 MG/ML ~~LOC~~ SOAJ: 140.0000 mg | SUBCUTANEOUS | 0 refills | Status: DC

## 2022-11-06 NOTE — Telephone Encounter (Signed)
Received fax from [facility] pharmacy to replace 1 Repatha pen. Rx sent in.

## 2022-11-07 DIAGNOSIS — M48061 Spinal stenosis, lumbar region without neurogenic claudication: Secondary | ICD-10-CM | POA: Diagnosis not present

## 2022-11-07 DIAGNOSIS — M545 Low back pain, unspecified: Secondary | ICD-10-CM | POA: Diagnosis not present

## 2022-11-09 DIAGNOSIS — M545 Low back pain, unspecified: Secondary | ICD-10-CM | POA: Diagnosis not present

## 2022-11-09 DIAGNOSIS — M48061 Spinal stenosis, lumbar region without neurogenic claudication: Secondary | ICD-10-CM | POA: Diagnosis not present

## 2022-11-12 DIAGNOSIS — M545 Low back pain, unspecified: Secondary | ICD-10-CM | POA: Diagnosis not present

## 2022-11-12 DIAGNOSIS — M48061 Spinal stenosis, lumbar region without neurogenic claudication: Secondary | ICD-10-CM | POA: Diagnosis not present

## 2022-11-14 DIAGNOSIS — M545 Low back pain, unspecified: Secondary | ICD-10-CM | POA: Diagnosis not present

## 2022-11-14 DIAGNOSIS — M48061 Spinal stenosis, lumbar region without neurogenic claudication: Secondary | ICD-10-CM | POA: Diagnosis not present

## 2022-11-19 DIAGNOSIS — M545 Low back pain, unspecified: Secondary | ICD-10-CM | POA: Diagnosis not present

## 2022-11-19 DIAGNOSIS — M48061 Spinal stenosis, lumbar region without neurogenic claudication: Secondary | ICD-10-CM | POA: Diagnosis not present

## 2022-11-21 DIAGNOSIS — M545 Low back pain, unspecified: Secondary | ICD-10-CM | POA: Diagnosis not present

## 2022-11-21 DIAGNOSIS — M48061 Spinal stenosis, lumbar region without neurogenic claudication: Secondary | ICD-10-CM | POA: Diagnosis not present

## 2022-11-22 ENCOUNTER — Telehealth: Payer: Self-pay | Admitting: Pharmacist

## 2022-11-22 DIAGNOSIS — G5603 Carpal tunnel syndrome, bilateral upper limbs: Secondary | ICD-10-CM | POA: Diagnosis not present

## 2022-11-22 NOTE — Telephone Encounter (Signed)
Pt called wanting to speak to Tammy regarding some questions about Ozempic. Advised pt a message would be sent back to her to give her a call back when possible.

## 2022-11-23 DIAGNOSIS — M545 Low back pain, unspecified: Secondary | ICD-10-CM | POA: Diagnosis not present

## 2022-11-23 DIAGNOSIS — M48062 Spinal stenosis, lumbar region with neurogenic claudication: Secondary | ICD-10-CM | POA: Diagnosis not present

## 2022-11-23 DIAGNOSIS — M48061 Spinal stenosis, lumbar region without neurogenic claudication: Secondary | ICD-10-CM | POA: Diagnosis not present

## 2022-11-23 DIAGNOSIS — Z6830 Body mass index (BMI) 30.0-30.9, adult: Secondary | ICD-10-CM | POA: Diagnosis not present

## 2022-11-23 NOTE — Telephone Encounter (Signed)
Pt called back to discuss her medication. Please call and advise.

## 2022-11-26 DIAGNOSIS — M545 Low back pain, unspecified: Secondary | ICD-10-CM | POA: Diagnosis not present

## 2022-11-26 DIAGNOSIS — M48061 Spinal stenosis, lumbar region without neurogenic claudication: Secondary | ICD-10-CM | POA: Diagnosis not present

## 2022-11-26 MED ORDER — OZEMPIC (0.25 OR 0.5 MG/DOSE) 2 MG/3ML ~~LOC~~ SOPN: 1.0000 mg | PEN_INJECTOR | SUBCUTANEOUS | Status: DC

## 2022-11-26 NOTE — Telephone Encounter (Signed)
Patient is requesting sample of Ozempic - provided #1 pen of Ozempic 0.25/0.5mg  dose - she will need to inject 0.5mg  x 2 each week.   Patient aware of sample was left for her.

## 2022-11-26 NOTE — Addendum Note (Signed)
Addended by: Henrene Pastor B on: 11/26/2022 12:45 PM   Modules accepted: Orders

## 2022-11-28 DIAGNOSIS — M545 Low back pain, unspecified: Secondary | ICD-10-CM | POA: Diagnosis not present

## 2022-11-28 DIAGNOSIS — M48061 Spinal stenosis, lumbar region without neurogenic claudication: Secondary | ICD-10-CM | POA: Diagnosis not present

## 2022-11-30 DIAGNOSIS — M545 Low back pain, unspecified: Secondary | ICD-10-CM | POA: Diagnosis not present

## 2022-11-30 DIAGNOSIS — M48061 Spinal stenosis, lumbar region without neurogenic claudication: Secondary | ICD-10-CM | POA: Diagnosis not present

## 2022-12-03 DIAGNOSIS — M545 Low back pain, unspecified: Secondary | ICD-10-CM | POA: Diagnosis not present

## 2022-12-03 DIAGNOSIS — M48061 Spinal stenosis, lumbar region without neurogenic claudication: Secondary | ICD-10-CM | POA: Diagnosis not present

## 2022-12-05 DIAGNOSIS — M545 Low back pain, unspecified: Secondary | ICD-10-CM | POA: Diagnosis not present

## 2022-12-05 DIAGNOSIS — G4733 Obstructive sleep apnea (adult) (pediatric): Secondary | ICD-10-CM | POA: Diagnosis not present

## 2022-12-05 DIAGNOSIS — M48061 Spinal stenosis, lumbar region without neurogenic claudication: Secondary | ICD-10-CM | POA: Diagnosis not present

## 2022-12-07 DIAGNOSIS — M48061 Spinal stenosis, lumbar region without neurogenic claudication: Secondary | ICD-10-CM | POA: Diagnosis not present

## 2022-12-07 DIAGNOSIS — M545 Low back pain, unspecified: Secondary | ICD-10-CM | POA: Diagnosis not present

## 2022-12-10 ENCOUNTER — Telehealth: Payer: Self-pay | Admitting: Pharmacist

## 2022-12-10 DIAGNOSIS — M545 Low back pain, unspecified: Secondary | ICD-10-CM | POA: Diagnosis not present

## 2022-12-10 DIAGNOSIS — M48061 Spinal stenosis, lumbar region without neurogenic claudication: Secondary | ICD-10-CM | POA: Diagnosis not present

## 2022-12-10 NOTE — Telephone Encounter (Signed)
Patient called to ask about samples for Ozempic. We were out but [employer] will be by to bring samples Wednesday, October 23rd. LM on VM regarding when samples might be available.

## 2022-12-12 ENCOUNTER — Other Ambulatory Visit: Payer: Self-pay | Admitting: Pharmacist

## 2022-12-12 MED ORDER — OZEMPIC (0.25 OR 0.5 MG/DOSE) 2 MG/3ML ~~LOC~~ SOPN: 1.0000 mg | PEN_INJECTOR | SUBCUTANEOUS | Status: DC

## 2022-12-12 NOTE — Telephone Encounter (Signed)
Patient's cost of Ozempic has increased. Provided patient with 2 pens of Ozempic 0.5mg  to try to get closer to the end of 2024 when benefits change.  Patient notofied.

## 2022-12-13 NOTE — Telephone Encounter (Signed)
See other phone note - samples left for patient

## 2022-12-14 DIAGNOSIS — M48061 Spinal stenosis, lumbar region without neurogenic claudication: Secondary | ICD-10-CM | POA: Diagnosis not present

## 2022-12-14 DIAGNOSIS — M545 Low back pain, unspecified: Secondary | ICD-10-CM | POA: Diagnosis not present

## 2022-12-17 DIAGNOSIS — M48061 Spinal stenosis, lumbar region without neurogenic claudication: Secondary | ICD-10-CM | POA: Diagnosis not present

## 2022-12-17 DIAGNOSIS — M545 Low back pain, unspecified: Secondary | ICD-10-CM | POA: Diagnosis not present

## 2022-12-19 DIAGNOSIS — M48061 Spinal stenosis, lumbar region without neurogenic claudication: Secondary | ICD-10-CM | POA: Diagnosis not present

## 2022-12-19 DIAGNOSIS — M545 Low back pain, unspecified: Secondary | ICD-10-CM | POA: Diagnosis not present

## 2022-12-21 DIAGNOSIS — M48061 Spinal stenosis, lumbar region without neurogenic claudication: Secondary | ICD-10-CM | POA: Diagnosis not present

## 2022-12-21 DIAGNOSIS — M545 Low back pain, unspecified: Secondary | ICD-10-CM | POA: Diagnosis not present

## 2022-12-24 DIAGNOSIS — M48061 Spinal stenosis, lumbar region without neurogenic claudication: Secondary | ICD-10-CM | POA: Diagnosis not present

## 2022-12-24 DIAGNOSIS — M545 Low back pain, unspecified: Secondary | ICD-10-CM | POA: Diagnosis not present

## 2022-12-25 ENCOUNTER — Telehealth: Payer: Self-pay | Admitting: Cardiology

## 2022-12-25 NOTE — Telephone Encounter (Signed)
Pt states she needs some paperwork for [facility] patient assistance. Please advise

## 2022-12-25 NOTE — Telephone Encounter (Signed)
Called and spoke to patient  Patient states:   -[employer] patient assistance helps her get Eliquis for free   -Received notice stating coverage will be ending 02/19/23  -last time she completed her part of the forms and sent remainder to the office   -she would like forms mailed to home   -address is [address] [name] [address] Informed patient copy will be sent in the mail Patient has no further questions at this time    Copy of [employer] form placed in outgoing mail to patients home

## 2022-12-26 DIAGNOSIS — M48061 Spinal stenosis, lumbar region without neurogenic claudication: Secondary | ICD-10-CM | POA: Diagnosis not present

## 2022-12-26 DIAGNOSIS — M545 Low back pain, unspecified: Secondary | ICD-10-CM | POA: Diagnosis not present

## 2022-12-28 DIAGNOSIS — M545 Low back pain, unspecified: Secondary | ICD-10-CM | POA: Diagnosis not present

## 2022-12-28 DIAGNOSIS — M48061 Spinal stenosis, lumbar region without neurogenic claudication: Secondary | ICD-10-CM | POA: Diagnosis not present

## 2022-12-31 DIAGNOSIS — M545 Low back pain, unspecified: Secondary | ICD-10-CM | POA: Diagnosis not present

## 2022-12-31 DIAGNOSIS — M48061 Spinal stenosis, lumbar region without neurogenic claudication: Secondary | ICD-10-CM | POA: Diagnosis not present

## 2023-01-02 DIAGNOSIS — M48061 Spinal stenosis, lumbar region without neurogenic claudication: Secondary | ICD-10-CM | POA: Diagnosis not present

## 2023-01-02 DIAGNOSIS — M545 Low back pain, unspecified: Secondary | ICD-10-CM | POA: Diagnosis not present

## 2023-01-03 ENCOUNTER — Telehealth: Payer: Self-pay | Admitting: Cardiology

## 2023-01-03 DIAGNOSIS — G5603 Carpal tunnel syndrome, bilateral upper limbs: Secondary | ICD-10-CM | POA: Diagnosis not present

## 2023-01-03 NOTE — Telephone Encounter (Signed)
Patient dropped off assistance forms In providers box

## 2023-01-04 DIAGNOSIS — M48061 Spinal stenosis, lumbar region without neurogenic claudication: Secondary | ICD-10-CM | POA: Diagnosis not present

## 2023-01-04 DIAGNOSIS — M545 Low back pain, unspecified: Secondary | ICD-10-CM | POA: Diagnosis not present

## 2023-01-07 ENCOUNTER — Telehealth: Payer: Self-pay | Admitting: Pharmacist

## 2023-01-07 NOTE — Telephone Encounter (Signed)
Patient called to request samples of Ozempic due to end of year cost. Left #2 samples at front desk of 0.5mg  - she will give 0.5mg  x 2 weekly.

## 2023-01-09 DIAGNOSIS — M545 Low back pain, unspecified: Secondary | ICD-10-CM | POA: Diagnosis not present

## 2023-01-09 DIAGNOSIS — M48061 Spinal stenosis, lumbar region without neurogenic claudication: Secondary | ICD-10-CM | POA: Diagnosis not present

## 2023-01-11 DIAGNOSIS — M545 Low back pain, unspecified: Secondary | ICD-10-CM | POA: Diagnosis not present

## 2023-01-11 DIAGNOSIS — M48061 Spinal stenosis, lumbar region without neurogenic claudication: Secondary | ICD-10-CM | POA: Diagnosis not present

## 2023-01-11 NOTE — Telephone Encounter (Signed)
Application has been faxed to the company.

## 2023-01-14 DIAGNOSIS — M545 Low back pain, unspecified: Secondary | ICD-10-CM | POA: Diagnosis not present

## 2023-01-14 DIAGNOSIS — M48061 Spinal stenosis, lumbar region without neurogenic claudication: Secondary | ICD-10-CM | POA: Diagnosis not present

## 2023-01-16 DIAGNOSIS — M545 Low back pain, unspecified: Secondary | ICD-10-CM | POA: Diagnosis not present

## 2023-01-16 DIAGNOSIS — M48061 Spinal stenosis, lumbar region without neurogenic claudication: Secondary | ICD-10-CM | POA: Diagnosis not present

## 2023-01-21 DIAGNOSIS — M48061 Spinal stenosis, lumbar region without neurogenic claudication: Secondary | ICD-10-CM | POA: Diagnosis not present

## 2023-01-21 DIAGNOSIS — M48062 Spinal stenosis, lumbar region with neurogenic claudication: Secondary | ICD-10-CM | POA: Diagnosis not present

## 2023-01-21 DIAGNOSIS — Z6830 Body mass index (BMI) 30.0-30.9, adult: Secondary | ICD-10-CM | POA: Diagnosis not present

## 2023-01-21 DIAGNOSIS — M545 Low back pain, unspecified: Secondary | ICD-10-CM | POA: Diagnosis not present

## 2023-01-23 DIAGNOSIS — M545 Low back pain, unspecified: Secondary | ICD-10-CM | POA: Diagnosis not present

## 2023-01-23 DIAGNOSIS — M48061 Spinal stenosis, lumbar region without neurogenic claudication: Secondary | ICD-10-CM | POA: Diagnosis not present

## 2023-01-24 DIAGNOSIS — M545 Low back pain, unspecified: Secondary | ICD-10-CM | POA: Diagnosis not present

## 2023-01-24 DIAGNOSIS — M48061 Spinal stenosis, lumbar region without neurogenic claudication: Secondary | ICD-10-CM | POA: Diagnosis not present

## 2023-01-30 ENCOUNTER — Telehealth: Payer: Self-pay | Admitting: Pharmacist

## 2023-01-30 DIAGNOSIS — M545 Low back pain, unspecified: Secondary | ICD-10-CM | POA: Diagnosis not present

## 2023-01-30 DIAGNOSIS — M48061 Spinal stenosis, lumbar region without neurogenic claudication: Secondary | ICD-10-CM | POA: Diagnosis not present

## 2023-01-30 MED ORDER — OZEMPIC (0.25 OR 0.5 MG/DOSE) 2 MG/3ML ~~LOC~~ SOPN: 1.0000 mg | PEN_INJECTOR | SUBCUTANEOUS | Status: DC

## 2023-01-30 NOTE — Telephone Encounter (Signed)
Patient called to request samples of Ozempic - provided #2 pens of Ozempic 0.5mg  - she will inject 0.5mg  2 times each week to equal 1mg .  Will reassess coverage of Ozempic in 2025.

## 2023-02-01 ENCOUNTER — Other Ambulatory Visit (HOSPITAL_BASED_OUTPATIENT_CLINIC_OR_DEPARTMENT_OTHER): Payer: Self-pay

## 2023-02-01 DIAGNOSIS — M48061 Spinal stenosis, lumbar region without neurogenic claudication: Secondary | ICD-10-CM | POA: Diagnosis not present

## 2023-02-01 DIAGNOSIS — M545 Low back pain, unspecified: Secondary | ICD-10-CM | POA: Diagnosis not present

## 2023-02-01 MED ORDER — COMIRNATY 30 MCG/0.3ML IM SUSY
0.3000 mL | PREFILLED_SYRINGE | Freq: Once | INTRAMUSCULAR | 0 refills | Status: AC
  Filled 2023-02-01: qty 0.3, 1d supply, fill #0

## 2023-02-04 ENCOUNTER — Telehealth: Payer: Self-pay | Admitting: Cardiology

## 2023-02-04 DIAGNOSIS — M48061 Spinal stenosis, lumbar region without neurogenic claudication: Secondary | ICD-10-CM | POA: Diagnosis not present

## 2023-02-04 DIAGNOSIS — M545 Low back pain, unspecified: Secondary | ICD-10-CM | POA: Diagnosis not present

## 2023-02-04 NOTE — Telephone Encounter (Signed)
Pt c/o medication issue:  1. Name of Medication:   apixaban (ELIQUIS) 2.5 MG TABS tablet    2. How are you currently taking this medication (dosage and times per day)? Take 1 tablet (2.5 mg total) by mouth 2 (two) times daily.   3. Are you having a reaction (difficulty breathing--STAT)? No  4. What is your medication issue? Patient is requesting to speak with pharmacist Megan in regards to the patient's grant renewal. Please advise.

## 2023-02-04 NOTE — Telephone Encounter (Signed)
Patient made aware that application was faxed 01/11/23. She was given phone # to [facility]MS Pt ass to call for follow up.

## 2023-02-06 DIAGNOSIS — M48061 Spinal stenosis, lumbar region without neurogenic claudication: Secondary | ICD-10-CM | POA: Diagnosis not present

## 2023-02-06 DIAGNOSIS — M545 Low back pain, unspecified: Secondary | ICD-10-CM | POA: Diagnosis not present

## 2023-02-08 DIAGNOSIS — M545 Low back pain, unspecified: Secondary | ICD-10-CM | POA: Diagnosis not present

## 2023-02-08 DIAGNOSIS — M48061 Spinal stenosis, lumbar region without neurogenic claudication: Secondary | ICD-10-CM | POA: Diagnosis not present

## 2023-02-18 ENCOUNTER — Telehealth: Payer: Self-pay

## 2023-02-18 NOTE — Telephone Encounter (Signed)
Called and spoke with patient to let her know that this office has received a fax from [facility] and states that they have approved her assistance program for her Eliquis. The letter states she will to be able to receive Eliquis free of charge from 02/20/2023 thru 02/19/2024.  She states that she has received a copy of the letter from them in the mail as well.  Will send documentation to HIM for scanning into her patient chart.

## 2023-02-21 DIAGNOSIS — M48061 Spinal stenosis, lumbar region without neurogenic claudication: Secondary | ICD-10-CM | POA: Diagnosis not present

## 2023-02-21 DIAGNOSIS — M79604 Pain in right leg: Secondary | ICD-10-CM | POA: Diagnosis not present

## 2023-02-21 DIAGNOSIS — M48062 Spinal stenosis, lumbar region with neurogenic claudication: Secondary | ICD-10-CM | POA: Diagnosis not present

## 2023-02-21 DIAGNOSIS — M4807 Spinal stenosis, lumbosacral region: Secondary | ICD-10-CM | POA: Diagnosis not present

## 2023-02-21 DIAGNOSIS — M79605 Pain in left leg: Secondary | ICD-10-CM | POA: Diagnosis not present

## 2023-02-22 DIAGNOSIS — G5601 Carpal tunnel syndrome, right upper limb: Secondary | ICD-10-CM | POA: Diagnosis not present

## 2023-02-22 HISTORY — PX: CARPAL TUNNEL RELEASE: SHX101

## 2023-02-28 DIAGNOSIS — G5601 Carpal tunnel syndrome, right upper limb: Secondary | ICD-10-CM | POA: Diagnosis not present

## 2023-03-06 ENCOUNTER — Telehealth: Payer: Self-pay

## 2023-03-06 ENCOUNTER — Ambulatory Visit (INDEPENDENT_AMBULATORY_CARE_PROVIDER_SITE_OTHER): Payer: Medicare HMO | Admitting: Pharmacist

## 2023-03-06 ENCOUNTER — Encounter: Payer: Self-pay | Admitting: Pharmacist

## 2023-03-06 DIAGNOSIS — E1165 Type 2 diabetes mellitus with hyperglycemia: Secondary | ICD-10-CM

## 2023-03-06 DIAGNOSIS — Z7985 Long-term (current) use of injectable non-insulin antidiabetic drugs: Secondary | ICD-10-CM

## 2023-03-06 DIAGNOSIS — E119 Type 2 diabetes mellitus without complications: Secondary | ICD-10-CM | POA: Insufficient documentation

## 2023-03-06 MED ORDER — SEMAGLUTIDE (1 MG/DOSE) 4 MG/3ML ~~LOC~~ SOPN: 1.0000 mg | PEN_INJECTOR | SUBCUTANEOUS | 2 refills | Status: DC

## 2023-03-06 NOTE — Telephone Encounter (Signed)
 Copied from CRM [id number]. Topic: General - Other >> Mar 06, 2023  2:51 PM Barney Boozer wrote: Reason for CRM: [facility] >> Mar 06, 2023  2:57 PM Barney Boozer wrote: Sierra Ford "NP' [facility] , Direct Cell Number [phone]  Patient is being used for a healthy brain study, she had an MRI of the brain that shows abnormalities that needs to be followed up on.  [name] states she as well will be sending a message through the portal.

## 2023-03-06 NOTE — Progress Notes (Signed)
 03/06/2023 Name: Sierra Ford MRN: [id number] DOB: Mar 22, 1940  Chief Complaint  Patient presents with   Hyperlipidemia   Diabetes   Medication Management    Sierra Ford is a 83 y.o. year old female who presented for a telephone visit.   They were referred to the pharmacist by their PCP for assistance in managing medication access.    Subjective:  Received call from patient that her medication coverage had changed for 2025. She now has Part D coverage with [facility] and not [employer] coverage. She reports that the cost of Repatha  was >$400 when she filled recently. She had previously been getting for $5 with either a grant or coupon.   We have also been supplying samples of Ozempic  due to copay on her previous insurance [employer] was $250 / month. Once she reaches deductible with her [facility] plan, cost of Ozempic  and Repatha  should be $47/month.   Medication Access/Adherence  Current Pharmacy:  [id number] - [place] - [address] [place] [address] Phone: [phone] Fax: [phone]  [id number] [place], [place] - [address] AT [address] [place] [address] Phone: [phone] Fax: [phone]  [facility] - [place], [place] - [address] AT Portal to Registered [facility] Sites [address] [place] [address] Phone: [phone] Fax: [phone]  [facility] - [place], [place] - [address] [place] [address] Phone: [phone] Fax: [phone]   Patient reports affordability concerns with their medications: Yes  Patient reports access/transportation concerns to their pharmacy: No  Patient reports adherence concerns with their medications:  Yes  due to cost    Objective:  Lab Results  Component Value Date   HGBA1C 6.3 (H) 07/17/2022    Lab Results   Component Value Date   CREATININE 0.76 07/17/2022   BUN 5 (L) 07/17/2022   NA 139 07/17/2022   K 3.9 07/17/2022   CL 105 07/17/2022   CO2 25 07/17/2022    Lab Results  Component Value Date   CHOL 187 10/26/2022   HDL 126 10/26/2022   LDLCALC 46 10/26/2022   LDLDIRECT 95.9 10/06/2012   TRIG 83 10/26/2022   CHOLHDL 1.5 10/26/2022    Medications Reviewed Today     Reviewed by Cecilie Coffee, RPH-CPP (Pharmacist) on 03/06/23 at 0954  Med List Status: <None>   Medication Order Taking? Sig Documenting Provider Last Dose Status Informant  apixaban  (ELIQUIS ) 2.5 MG TABS tablet [id number]  Take 1 tablet (2.5 mg total) by mouth 2 (two) times daily. Eilleen Grates, MD  Active Self  blood glucose meter kit and supplies KIT [id number]  Dispense based on patient and insurance preference. Use up to four times daily as directed. (FOR ICD-9 250.00, 250.01). Estill Hemming, DO  Active Self  Calcium  Carb-Cholecalciferol (CALCIUM  600 + D PO) [id number]  Take 1 tablet by mouth daily. [provider]  Active Self  cephALEXin  (KEFLEX ) 500 MG capsule [id number]  Take 1 capsule (500 mg total) by mouth 2 (two) times daily. Lowne Chase, Yvonne R, DO  Active   cholecalciferol (VITAMIN D3) 25 MCG (1000 UNIT) tablet [id number]  Take 1,000 Units by mouth 2 (two) times daily. [provider]  Active Self  Coenzyme Q10 100 MG TABS [id number]  Take 100 mg by mouth daily. [provider]  Active Self  diphenhydrAMINE HCl,  Sleep, (ZZZQUIL) 25 MG CAPS [id number]  Take 25 mg by mouth at bedtime as needed (sleep). [provider]  Active Self  Evolocumab  (REPATHA  SURECLICK) 140 MG/ML SOAJ [id number]  Inject 140 mg into the skin every 14 (fourteen) days. Eilleen Grates, MD  Active   Flaxseed, Linseed, (FLAXSEED OIL) 1200 MG CAPS [id number]  Take 1,200 mg by mouth daily. [provider]  Active Self  furosemide  (LASIX ) 40 MG tablet [id number]  TAKE 1 TABLET DAILY Hochrein, Royston Cornea,  MD  Active   gabapentin  (NEURONTIN ) 300 MG capsule [id number]  Take 300 mg by mouth at bedtime as needed (pain/sleep). [provider]  Active Self  Insulin  Pen Needle (NOVOFINE PLUS PEN NEEDLE) 32G X 4 MM MISC [id number]  As directed Lowne Chase, Yvonne R, DO  Active Self  methocarbamol  (ROBAXIN ) 500 MG tablet [id number]  Take 1 tablet (500 mg total) by mouth 4 (four) times daily. Lowne Chase, Yvonne R, DO  Active   Multiple Vitamin (MULTIVITAMIN) capsule [id number]  Take 1 capsule by mouth daily with breakfast. [provider]  Active Self    Discontinued 05/07/11 1124 (Discontinued by provider) Omega-3 Fatty Acids (FISH OIL) 1000 MG CAPS [id number]  Take 1,000 mg by mouth daily. [provider]  Active Self  OVER THE COUNTER MEDICATION [id number]  Apply 1 Application topically at bedtime. Nervive cream, apply to hands [provider]  Active Self  oxyCODONE  (OXY IR/ROXICODONE ) 5 MG immediate release tablet [id number]  Take 1 tablet (5 mg total) by mouth every 6 (six) hours as needed for moderate pain ((score 4 to 6)). Easter Golden Taylor, PA-C  Active   potassium chloride  SA (KLOR-CON  M) 20 MEQ tablet [id number]  Take 1 tablet (20 mEq total) by mouth daily.  Patient taking differently: Take 20 mEq by mouth daily as needed (when taking lasix ).   Lasalle Pointer, NP  Active Self    Discontinued 05/07/11 1124 (Change in therapy) rosuvastatin  (CRESTOR ) 10 MG tablet [id number]  TAKE 1 TABLET DAILY Eilleen Grates, MD  Active   Semaglutide , 1 MG/DOSE, 4 MG/3ML SOPN [id number]  Inject 1 mg as directed once a week. Lowne Chase, Yvonne R, DO  Active   Semaglutide ,0.25 or 0.5MG /DOS, (OZEMPIC , 0.25 OR 0.5 MG/DOSE,) 2 MG/3ML SOPN [id number]  Inject 1 mg into the skin once a week. [name], Adel Holt R, [place]  Active   TRUE METRIX BLOOD GLUCOSE TEST test strip [id number]  TEST  UP  TO FOUR TIMES DAILY AS DIRECTED Lowne Chase, Yvonne R, DO  Active Self  TRUEPLUS LANCETS 33G MISC [id number]   TEST  UP TO FOUR TIMES DAILY AS DIRECTED [name], Yvonne R, DO  Active Self              Assessment/Plan:   Medication Management / Access:  - Reviewed 2025 [employer] Part D benefits. Patient has deductible to meet which is why Repatha  cost is high. Should be $47 once she meets deductible.   - Applied for for [employer] - patient was approved for hypercholesterolemia grant $2500 thru 02/02/2024.    - Called [employer] to give [employer] information - covered deductible and cost was $0.  - Ozemipc should also be $47 per month. Updated Rx for patient. Will mail patient application for Novo Nordisk medication assistance program (she did not qualify last year because she has a Secondary school teacher with [employer])   Meds ordered this encounter  Medications   Semaglutide , 1 MG/DOSE, 4 MG/3ML SOPN    Sig: Inject  1 mg as directed once a week.    Dispense:  3 mL    Refill:  2     Follow Up Plan: 1 to 2 month  Cecilie Coffee, PharmD Clinical Pharmacist [facility]

## 2023-03-06 NOTE — Telephone Encounter (Signed)
 FYI

## 2023-03-07 ENCOUNTER — Other Ambulatory Visit: Payer: Self-pay | Admitting: Family Medicine

## 2023-03-07 DIAGNOSIS — G5601 Carpal tunnel syndrome, right upper limb: Secondary | ICD-10-CM | POA: Diagnosis not present

## 2023-03-07 DIAGNOSIS — R9089 Other abnormal findings on diagnostic imaging of central nervous system: Secondary | ICD-10-CM

## 2023-03-08 ENCOUNTER — Telehealth: Payer: Self-pay | Admitting: Emergency Medicine

## 2023-03-08 ENCOUNTER — Encounter: Payer: Self-pay | Admitting: Primary Care

## 2023-03-08 ENCOUNTER — Ambulatory Visit: Payer: Medicare HMO | Admitting: Primary Care

## 2023-03-08 VITALS — BP 143/80 | HR 78 | Temp 97.3°F | Ht 62.0 in | Wt 167.0 lb

## 2023-03-08 DIAGNOSIS — J302 Other seasonal allergic rhinitis: Secondary | ICD-10-CM | POA: Diagnosis not present

## 2023-03-08 DIAGNOSIS — G4733 Obstructive sleep apnea (adult) (pediatric): Secondary | ICD-10-CM

## 2023-03-08 MED ORDER — IPRATROPIUM BROMIDE 0.06 % NA SOLN: 2.0000 | Freq: Four times a day (QID) | NASAL | 1 refills | Status: DC

## 2023-03-08 NOTE — Telephone Encounter (Signed)
Copied from CRM [id number]. Topic: General - Other >> Mar 08, 2023  3:18 PM Truddie Crumble wrote: Reason for CRM: Pt called stating [facility] did [facility] on her and they found something on her brain and pt want to know if she has to see the provider first or schedule an MRI with contrast

## 2023-03-08 NOTE — Patient Instructions (Signed)
-  OBSTRUCTIVE SLEEP APNEA: Obstructive sleep apnea is a condition where your breathing stops and starts repeatedly during sleep due to blocked airways. Your condition is well controlled with your CPAP machine, which you are using consistently for about 8 hours and 39 minutes each night. Your apnea score is excellent at 0.3 events per hour. Continue with your current CPAP settings and renew your supplies with [employer] [facility].  -RHINITIS: Rhinitis is the irritation and inflammation of the mucous membrane inside the nose, often causing a runny nose. You have a new onset of nasal discharge, which might be due to environmental factors or a viral cold. If the symptoms persist, you can use the prescribed Atrovent nasal spray. Follow up if the symptoms last more than today.  Orders: DME renew CPAP supplies x1 year with [employer]   Follow-up; Follow up in 1 year with [facility] NP or sooner if issues arise.

## 2023-03-08 NOTE — Progress Notes (Signed)
@Patient  ID: Sierra Ford, female    DOB: [birth year], 83 y.o.   MRN: [id number]  Chief Complaint  Patient presents with   Follow-up    Referring provider: Donato Schultz, *  HPI: 83 year old female, former smoker (50 pack year hx) PMH significant for CAD, CHF, OSA, type 2 diabetes, DVT/PE in 2006  treated with coumadin x 45yrs  03/08/2023- Interim hx  Discussed the use of AI scribe software for clinical note transcription with the patient, who gave verbal consent to proceed.  History of Present Illness   The patient, with a history of moderate to severe sleep apnea diagnosed in 2017, has been compliant with CPAP therapy for approximately eight years. She reports significant improvement in sleep quality with consistent CPAP use, noting that without it, her sleep is fragmented and she wakes up frequently. She denies any noticeable increase in energy levels but attributes this to a retired and relatively sedentary lifestyle.  The patient's CPAP machine is reportedly functioning well, and she has recently received a replacement after five years of use. She uses a nasal pillow mask with no reported issues. The patient's compliance report indicates excellent adherence to CPAP therapy, with an average use of about eight hours and thirty-nine minutes per night. Her apnea score is well controlled at 0.3 events per hour, down from 27 events per hour at the time of diagnosis.  The patient reported a new onset of continuous nasal discharge that started the morning of the consultation. She denied any changes in the humidification of her CPAP machine or any other identifiable triggers. The patient opted to monitor the symptom and contact the clinic if it persists beyond the day of the consultation.      Airview download 12/06/2022 - 03/05/2023 Usage days 90/90 days (100%) greater than 4 hours Average usage 8 hours 39 minutes Pressure 10 cm H2 air leaks 9.9 L/min (95%) AHI 0.3      Significant tests/ events reviewed   10/2020 ONO on CPAP/RA showed minimal desat , about 7 mins HST 11/08/15 AHI 28/h   05/2013 PFTs no airway obstruction, ratio 81, FEV1 98%, FVC 93%, DLCO 59% corrects to 80% for alveolar volume   02/2019 echo normal LVEF, normal RVSP   01/27/19 CTA chest PE >> Positive for acute PE with CT evidence of right heart strain (RV/LV Ratio = 1.7) consistent with at least submassive (intermediate risk)PE.    01/28/19  LE VAS duplex DVT >> Right: Findings consistent with acute deep vein thrombosis involving the right femoral vein, right popliteal vein, right posterior tibial veins, right peroneal veins, and right gastrocnemius veins.  No Known Allergies  Immunization History  Administered Date(s) Administered   Fluad Quad(high Dose 65+) 12/04/2019, 11/03/2020, 12/29/2020, 12/07/2021   Fluad Trivalent(High Dose 65+) 10/19/2022   Influenza, High Dose Seasonal PF 12/22/2018   Influenza-Unspecified 12/04/2018   PFIZER Comirnaty(Gray Top)Covid-19 Tri-Sucrose Vaccine 06/08/2020   PFIZER(Purple Top)SARS-COV-2 Vaccination 03/06/2019, 03/27/2019, 11/10/2019   Pfizer Covid-19 Vaccine Bivalent Booster 8yrs & up 12/06/2020   Pfizer(Comirnaty)Fall Seasonal Vaccine 12 years and older 12/07/2021, 02/01/2023   Pneumococcal Conjugate-13 04/24/2013   Pneumococcal Polysaccharide-23 08/07/2007, 12/04/2019   Td 03/03/2003   Td (Adult), 2 Lf Tetanus Toxid, Preservative Free 03/03/2003   Tdap 11/06/2018   Zoster Recombinant(Shingrix) 11/06/2018, 05/11/2019   Zoster, Live 08/14/2007, 11/13/2007    Past Medical History:  Diagnosis Date   Anxiety    Back pain    Chest pain    CLite with  apical ischemia in 2006 - normal coronary arteries by [facility] in 12/2004;  Myoview 11/12:  Low risk stress nuclear study with a small, partially reversible apical defect most likely related to apical thinning; cannot R/O very mild apical ischemia.  EF: 75%    Complication of anesthesia     trouble waking after colonoscopy slept all day afterward into the next morning   Decreased hearing    Depression    Diabetes (HCC)    type II   DVT (deep venous thrombosis) (HCC) 2006   hx of and again in 2020   Ectopic pregnancy with intrauterine pregnancy    Hiatal hernia    patient not aware of hernia   Hyperlipidemia    PE (pulmonary embolism) 2006   hx of and again in 2020   Sleep apnea    CPAP     Tobacco History: Social History   Tobacco Use  Smoking Status Former   Current packs/day: 0.00   Average packs/day: 1 pack/day for 50.0 years (50.0 ttl pk-yrs)   Types: Cigarettes   Start date: 11/22/1959   Quit date: 11/21/2009   Years since quitting: 13.3  Smokeless Tobacco Never   Counseling given: Not Answered   Outpatient Medications Prior to Visit  Medication Sig Dispense Refill   apixaban (ELIQUIS) 2.5 MG TABS tablet Take 1 tablet (2.5 mg total) by mouth 2 (two) times daily. 60 tablet 5   blood glucose meter kit and supplies KIT Dispense based on patient and insurance preference. Use up to four times daily as directed. (FOR ICD-9 250.00, 250.01). 1 each 0   Calcium Carb-Cholecalciferol (CALCIUM 600 + D PO) Take 1 tablet by mouth daily.     cephALEXin (KEFLEX) 500 MG capsule Take 1 capsule (500 mg total) by mouth 2 (two) times daily. 14 capsule 0   cholecalciferol (VITAMIN D3) 25 MCG (1000 UNIT) tablet Take 1,000 Units by mouth 2 (two) times daily.     Coenzyme Q10 100 MG TABS Take 100 mg by mouth daily.     diphenhydrAMINE HCl, Sleep, (ZZZQUIL) 25 MG CAPS Take 25 mg by mouth at bedtime as needed (sleep).     Evolocumab (REPATHA SURECLICK) 140 MG/ML SOAJ Inject 140 mg into the skin every 14 (fourteen) days. 2 mL 11   Flaxseed, Linseed, (FLAXSEED OIL) 1200 MG CAPS Take 1,200 mg by mouth daily.     furosemide (LASIX) 40 MG tablet TAKE 1 TABLET DAILY 90 tablet 2   gabapentin (NEURONTIN) 300 MG capsule Take 300 mg by mouth at bedtime as needed (pain/sleep).     Insulin  Pen Needle (NOVOFINE PLUS PEN NEEDLE) 32G X 4 MM MISC As directed 100 each 1   methocarbamol (ROBAXIN) 500 MG tablet Take 1 tablet (500 mg total) by mouth 4 (four) times daily. 45 tablet 3   Multiple Vitamin (MULTIVITAMIN) capsule Take 1 capsule by mouth daily with breakfast.     Omega-3 Fatty Acids (FISH OIL) 1000 MG CAPS Take 1,000 mg by mouth daily.     OVER THE COUNTER MEDICATION Apply 1 Application topically at bedtime. Nervive cream, apply to hands     oxyCODONE (OXY IR/ROXICODONE) 5 MG immediate release tablet Take 1 tablet (5 mg total) by mouth every 6 (six) hours as needed for moderate pain ((score 4 to 6)). 30 tablet 0   potassium chloride SA (KLOR-CON M) 20 MEQ tablet Take 1 tablet (20 mEq total) by mouth daily. (Patient taking differently: Take 20 mEq by mouth daily as needed (  when taking lasix).) 90 tablet 3   rosuvastatin (CRESTOR) 10 MG tablet TAKE 1 TABLET DAILY 90 tablet 2   Semaglutide, 1 MG/DOSE, 4 MG/3ML SOPN Inject 1 mg as directed once a week. 3 mL 2   TRUE METRIX BLOOD GLUCOSE TEST test strip TEST  UP  TO FOUR TIMES DAILY AS DIRECTED 200 each 12   TRUEPLUS LANCETS 33G MISC TEST  UP TO FOUR TIMES DAILY AS DIRECTED 200 each 12   No facility-administered medications prior to visit.    Review of Systems  Review of Systems  Constitutional:  Negative for fatigue.  HENT:  Positive for postnasal drip.   Respiratory: Negative.    Cardiovascular: Negative.    Physical Exam  BP (!) 143/80 (BP Location: Left Arm, Patient Position: Sitting, Cuff Size: Large)   Pulse 78   Temp (!) 97.3 F (36.3 C) (Temporal)   Ht 5\' 2"  (1.575 m)   Wt 167 lb (75.8 kg)   SpO2 94%   BMI 30.54 kg/m  Physical Exam Constitutional:      General: She is not in acute distress.    Appearance: Normal appearance. She is not ill-appearing.  HENT:     Head: Normocephalic and atraumatic.     Nose: Rhinorrhea present.  Cardiovascular:     Rate and Rhythm: Normal rate and regular rhythm.   Pulmonary:     Effort: Pulmonary effort is normal.     Breath sounds: Normal breath sounds.  Skin:    General: Skin is warm and dry.  Neurological:     General: No focal deficit present.     Mental Status: She is alert and oriented to person, place, and time. Mental status is at baseline.  Psychiatric:        Mood and Affect: Mood normal.        Behavior: Behavior normal.        Thought Content: Thought content normal.        Judgment: Judgment normal.      Lab Results:  CBC    Component Value Date/Time   WBC 4.6 07/17/2022 1200   RBC 4.61 07/17/2022 1200   HGB 14.3 07/17/2022 1200   HGB 14.4 05/27/2017 1224   HCT 44.1 07/17/2022 1200   HCT 42.2 05/27/2017 1224   PLT 258 07/17/2022 1200   PLT 221 05/27/2017 1224   MCV 95.7 07/17/2022 1200   MCV 90 05/27/2017 1224   MCH 31.0 07/17/2022 1200   MCHC 32.4 07/17/2022 1200   RDW 13.2 07/17/2022 1200   RDW 13.7 05/27/2017 1224   LYMPHSABS 1.8 03/22/2022 1123   LYMPHSABS 2.1 05/27/2017 1224   MONOABS 0.5 03/22/2022 1123   EOSABS 0.1 03/22/2022 1123   EOSABS 0.2 05/27/2017 1224   BASOSABS 0.0 03/22/2022 1123   BASOSABS 0.0 05/27/2017 1224    BMET    Component Value Date/Time   NA 139 07/17/2022 1200   NA 139 11/08/2021 1016   K 3.9 07/17/2022 1200   CL 105 07/17/2022 1200   CO2 25 07/17/2022 1200   GLUCOSE 108 (H) 07/17/2022 1200   BUN 5 (L) 07/17/2022 1200   BUN 17 11/08/2021 1016   CREATININE 0.76 07/17/2022 1200   CREATININE 0.82 11/10/2019 1122   CALCIUM 10.1 07/17/2022 1200   GFRNONAA >60 07/17/2022 1200   GFRNONAA 71 10/04/2015 0845   GFRAA >60 01/31/2019 0230   GFRAA 82 10/04/2015 0845    BNP    Component Value Date/Time   BNP 37.6 03/04/2018 1609  ProBNP    Component Value Date/Time   PROBNP 28.0 05/18/2013 0948    Imaging: No results found.   Assessment & Plan:   1. OSA (obstructive sleep apnea) (Primary) - AMB REFERRAL FOR DME  2. Seasonal allergic rhinitis, unspecified  trigger     Obstructive Sleep Apnea Well controlled with CPAP at a pressure of 10cm h20. Reports improved sleep quality and less fragmented sleep. Compliance report shows 100% compliance with an average use of 8 hours and 39 minutes per night. Apnea score is 0.3, indicating excellent control. -Continue current CPAP settings. -Renew supplies with [employer].  Rhinitis New onset of runny nose, possibly due to environmental factors or a viral cold. No changes in CPAP humidification settings. -Send prescription for Atrovent nasal spray to be filled if symptoms persist. -Follow up if symptoms last more than today.  Follow-up in 1 year unless issues arise.        Glenford Bayley, NP 03/08/2023

## 2023-03-12 NOTE — Telephone Encounter (Signed)
The results are not under imaging but under referrals on 03/07/23.Please advise

## 2023-03-14 ENCOUNTER — Other Ambulatory Visit (HOSPITAL_COMMUNITY): Payer: Self-pay

## 2023-03-14 ENCOUNTER — Telehealth: Payer: Self-pay | Admitting: Pharmacist

## 2023-03-14 ENCOUNTER — Telehealth: Payer: Self-pay | Admitting: Pharmacy Technician

## 2023-03-14 ENCOUNTER — Telehealth: Payer: Self-pay | Admitting: Cardiology

## 2023-03-14 NOTE — Telephone Encounter (Signed)
Pharmacy Patient Advocate Encounter  Received notification from [facility] that Prior Authorization for repatha has been APPROVED from 02/20/23 to 02/19/24   PA #/Case ID/Reference #: [id number]

## 2023-03-14 NOTE — Telephone Encounter (Signed)
Patient left a message on VM of Clinical Pharmacist Practitioner. She reports that she has received 4 letters from [facility] stating that they are not going to cover Repatha however on Human website it looks like patient's [facility] Medicare plan has Repatha as tier 3.  Patient read the letter she received to me and it did state that Repatha was not on their formulary and would require a prior authorization.  Since Dr Kirtland Bouchard is prescribed for Repatha recommended she contact their office for prior authorization.  Patient endorsed that she did have Repatha on hand - filled 28 day supply on 03/02/2023.   She called back and states she was routed to the [facility] billing department and left a message with them about the needed prior authorization but I don't think this is the correct department.  Recommended she call cardiology office back and leave a message with Dr Balinda Quails nurse.   I will also forward to our pharmacy technician prior authorization team to see if they are able to help.

## 2023-03-14 NOTE — Telephone Encounter (Signed)
Pt c/o medication issue:  1. Name of Medication: Evolocumab (REPATHA SURECLICK) 140 MG/ML SOAJ   2. How are you currently taking this medication (dosage and times per day)?    3. Are you having a reaction (difficulty breathing--STAT)? no  4. What is your medication issue? Patient has new insurance so prior Berkley Harvey is needed now. Please advise

## 2023-03-14 NOTE — Telephone Encounter (Signed)
Pharmacy Patient Advocate Encounter   Received notification from Patient Advice Request messages that prior authorization for repatha is required/requested.   Insurance verification completed.   The patient is insured through [employer] .   Per test claim: PA required; PA submitted to above mentioned insurance via CoverMyMeds Key/confirmation #/EOC [facility] Status is pending

## 2023-03-14 NOTE — Telephone Encounter (Signed)
 Called patient with no answer. Left message to call back

## 2023-03-15 NOTE — Telephone Encounter (Signed)
Called patient to inform her Repatha PA approved. Left detail message PA approved and to contact pharmacy.

## 2023-03-19 ENCOUNTER — Encounter: Payer: Self-pay | Admitting: Pharmacist

## 2023-03-19 NOTE — Progress Notes (Signed)
   03/19/2023 Name: Sierra Ford MRN: [id number] DOB: 24-Mar-1940  Chief Complaint  Patient presents with   Medication Management    Sierra Ford is a 83 y.o. year old female who presented for a telephone visit.   They were referred to the pharmacist by their PCP for assistance in managing medication access.    Subjective:  Received call from patient that [facility] is requiring prior authorization for Ozempic. She receive a letter in the mail that they have filled 1 time but need PCP to provide prior authorization.    Medication Access/Adherence  Current Pharmacy:  [id number] - [place] ([place]), [place] - [address] [place] ([place] [address] Phone: [phone] Fax: [phone]  [id number] [place], [place] - [address] AT [facility] [address] [place] [address] Phone: [phone] Fax: [phone]  [facility] - [place], [place] - [address] AT Portal to Registered [facility] Sites [address] [place] [address] Phone: [phone] Fax: [phone]  [facility] - [place], [place] - [address] [place] [address] Phone: [phone] Fax: [phone]   Patient reports affordability concerns with their medications: Yes  Patient reports access/transportation concerns to their pharmacy: No  Patient reports adherence concerns with their medications:  Yes  due to cost    Objective:  Lab Results  Component Value Date   HGBA1C 6.3 (H) 07/17/2022    Lab Results  Component Value Date   CREATININE 0.76 07/17/2022   BUN 5 (L) 07/17/2022   NA 139 07/17/2022   K 3.9 07/17/2022   CL 105 07/17/2022   CO2 25 07/17/2022    Lab Results  Component Value Date   CHOL 187 10/26/2022   HDL 126 10/26/2022   LDLCALC 46 10/26/2022   LDLDIRECT 95.9 10/06/2012   TRIG 83 10/26/2022   CHOLHDL 1.5 10/26/2022     Medications Reviewed Today   Medications were not reviewed in this encounter       Assessment/Plan:   Medication Management / Access:  - Completed prior authorization for Ozempic - should hear decision in 72 hours.   Cover My meds -  (Key: [id number])    Follow Up Plan: 1 to 2 month  Henrene Pastor, PharmD Clinical Pharmacist [facility]

## 2023-03-22 ENCOUNTER — Ambulatory Visit
Admission: RE | Admit: 2023-03-22 | Discharge: 2023-03-22 | Disposition: A | Discharge status: Home / Self Care | Payer: Medicare HMO | Source: Ambulatory Visit | Attending: Family Medicine | Admitting: Family Medicine

## 2023-03-22 DIAGNOSIS — D320 Benign neoplasm of cerebral meninges: Secondary | ICD-10-CM | POA: Diagnosis not present

## 2023-03-22 DIAGNOSIS — R9089 Other abnormal findings on diagnostic imaging of central nervous system: Secondary | ICD-10-CM

## 2023-03-22 MED ORDER — GADOPICLENOL 0.5 MMOL/ML IV SOLN
7.5000 mL | Freq: Once | INTRAVENOUS | Status: AC | PRN
  Administered 2023-03-22: 7.5 mL via INTRAVENOUS

## 2023-03-26 NOTE — Telephone Encounter (Signed)
 PA request has been Approved. New Encounter created for follow up. For additional info see Pharmacy Prior Auth telephone encounter from 03/14/23.

## 2023-04-01 ENCOUNTER — Telehealth: Payer: Self-pay

## 2023-04-01 NOTE — Telephone Encounter (Signed)
 Copied from CRM [id number]. Topic: Clinical - Lab/Test Results >> Apr 01, 2023 12:31 PM Sierra Ford B wrote: Reason for CRM: Pt stated that she would like a callback to follow up regarding an MRI w/ contrast results.

## 2023-04-01 NOTE — Telephone Encounter (Signed)
 Results not back yet

## 2023-04-05 DIAGNOSIS — Z6831 Body mass index (BMI) 31.0-31.9, adult: Secondary | ICD-10-CM | POA: Diagnosis not present

## 2023-04-05 DIAGNOSIS — M4316 Spondylolisthesis, lumbar region: Secondary | ICD-10-CM | POA: Diagnosis not present

## 2023-04-06 ENCOUNTER — Encounter: Payer: Self-pay | Admitting: Family Medicine

## 2023-04-08 ENCOUNTER — Ambulatory Visit (INDEPENDENT_AMBULATORY_CARE_PROVIDER_SITE_OTHER): Payer: Medicare HMO | Admitting: Pharmacist

## 2023-04-08 DIAGNOSIS — E119 Type 2 diabetes mellitus without complications: Secondary | ICD-10-CM

## 2023-04-08 DIAGNOSIS — Z7985 Long-term (current) use of injectable non-insulin antidiabetic drugs: Secondary | ICD-10-CM

## 2023-04-08 DIAGNOSIS — R9089 Other abnormal findings on diagnostic imaging of central nervous system: Secondary | ICD-10-CM

## 2023-04-08 DIAGNOSIS — E1165 Type 2 diabetes mellitus with hyperglycemia: Secondary | ICD-10-CM

## 2023-04-08 NOTE — Progress Notes (Signed)
04/08/2023 Name: Sierra Ford MRN: [id number] DOB: [birth year]  Chief Complaint  Patient presents with   Diabetes   Medication Management    Sierra Ford is a 83 y.o. year old female who presented for a telephone visit.   They were referred to the pharmacist by their PCP for assistance in managing medication access.    Subjective:  Received call from patient that [facility] is requiring prior authorization for Ozempic. She receive a letter in the mail that they have filled 1 time but need PCP to provide prior authorization.   Patient asks about next steps after her recent MRI.  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 They did check MRIs about once per year. Her most recent MRI with the study showed something suspicious and they recommended that she have an MRI with contract.  MRI with contract was done 03/22/2023 and showed a meningeoma but there were no past MRIs to compare. Patient has her records from [facility]   She reports that she only has minor headaches every once in awhile. Lasts a short period of time. Has not had any changes in the last few months and no change in intensity.    Medication Access/Adherence  Current Pharmacy:  [id number] - [place] ([place] - [address] [place] ([place] [address] Phone: [phone] Fax: [phone]  [id number] [place], [place] - [address] AT [facility] [address] [place] [address] Phone: [phone] Fax: [phone]  [facility] - [place], [place] - [address] AT Portal to Registered [facility] Sites [address] [place] [address] Phone: [phone] Fax: [phone]  [facility] - [place], [place] - [address] [place] [address] Phone: [phone] Fax:  [phone]   Patient reports affordability concerns with their medications: Yes  Patient reports access/transportation concerns to their pharmacy: No  Patient reports adherence concerns with their medications:  Yes  due to cost    Diabetes:  Current medications: Ozempic 1mg  weekly  Medications tried in the past:   Current glucose readings: 105 last night before evening meal Using True Metrix meter; testing 1 or 2 times weekly   Patient denies hypoglycemic s/sx including no dizziness, shakiness, sweating. Patient reports hyperglycemic symptoms - states last night she urinated about every 45 minutes - denies fever, chills or dysuria. This morning she has had normal urination.  Denies polydipsia, polyphagia, nocturia, neuropathy, blurred vision.   Current physical activity: not exercising much due to back pain - she had surgery last year.   Current medication access support: gets assistance with Repatha cost thru [employer]. Has also received assistance thru their Type 2 DM fund in past but this fund is not currently open.     Objective:  Lab Results  Component Value Date   HGBA1C 6.3 (H) 07/17/2022    Lab Results  Component Value Date   CREATININE 0.76 07/17/2022   BUN 5 (L) 07/17/2022   NA 139 07/17/2022   K 3.9 07/17/2022   CL 105 07/17/2022   CO2 25 07/17/2022    Lab Results  Component Value Date   CHOL 187 10/26/2022   HDL 126 10/26/2022   LDLCALC 46 10/26/2022   LDLDIRECT 95.9 10/06/2012   TRIG 83 10/26/2022   CHOLHDL 1.5 10/26/2022  Medications Reviewed Today   Medications were not reviewed in this encounter       Assessment/Plan:   Diabetes: - Continue Ozempic 1mg  weekly. Received patient's application for [employer] medication assistance program today. Completed provider portion and forward to PCP to review and sign. Patient Endorses that she has 4 doses of Ozempic 1mg  on hand.  - Recommended patient check blood glucose daily  for the next 2 to 3 days. She is to notify office ifi FBG > 150 or ranom blood glucose > 200. She will also monitor urinary symptoms and contact office if she has recurrence of polyuria or start of dysuria, fever or chills.  - Reviewed signs and symptoms of hypo and hyperglycemia and when to contact office / check blood glucose.   Abnormal MRI results:  - sent message to PCP for advice on next steps for patient - either appt with PCP or specialist?    Follow Up Plan: 2 to 3 weeks  Henrene Pastor, PharmD Clinical Pharmacist [facility]

## 2023-04-10 ENCOUNTER — Telehealth: Payer: Self-pay | Admitting: Pharmacist

## 2023-04-10 DIAGNOSIS — D329 Benign neoplasm of meninges, unspecified: Secondary | ICD-10-CM

## 2023-04-10 NOTE — Telephone Encounter (Signed)
Patient was notified of Dr Maryln Gottron recommendations. Ms. Schmaltz would like to get a referral to see a neurosurgeon regarding meningioma. at [facility] in [place].  Ms. Garrette will request a printout of past MRI results to be faxed to our office.

## 2023-04-10 NOTE — Telephone Encounter (Signed)
-----   Message from Donato Schultz sent at 04/08/2023  2:09 PM EST ----- If we can get the report from [facility] that would be good----- if she has a copy of the films that would not be helpful but she should take them to whichever [facility] she sees.  If she does not go to [facility] in [place]--- we would need tp refer to [facility]? ----- Message ----- From: Henrene Pastor, RPH-CPP Sent: 04/08/2023   9:49 AM EST To: Donato Schultz, DO  Patient states she has not noticed change in headaches - she has occasional mild headache. She has a copy of previous MRIs from [facility] study for comparision. Do you want her to make and appointment with you or refer to neurosurgeon? She would prefer a different office than [facility] who did her back surgery.

## 2023-04-24 DIAGNOSIS — D329 Benign neoplasm of meninges, unspecified: Secondary | ICD-10-CM | POA: Diagnosis not present

## 2023-04-29 ENCOUNTER — Ambulatory Visit (INDEPENDENT_AMBULATORY_CARE_PROVIDER_SITE_OTHER): Payer: Medicare HMO | Admitting: Pharmacist

## 2023-04-29 DIAGNOSIS — Z7985 Long-term (current) use of injectable non-insulin antidiabetic drugs: Secondary | ICD-10-CM

## 2023-04-29 DIAGNOSIS — E119 Type 2 diabetes mellitus without complications: Secondary | ICD-10-CM

## 2023-04-29 MED ORDER — ROSUVASTATIN CALCIUM 10 MG PO TABS: 10.0000 mg | ORAL_TABLET | Freq: Every day | ORAL | 3 refills | Status: AC

## 2023-04-29 NOTE — Progress Notes (Signed)
 04/29/2023 Name: Sierra Ford MRN: [id number] DOB: [birth year]  Chief Complaint  Patient presents with   Diabetes   Medication Management    Sierra Ford is a 83 y.o. year old female who presented for a telephone visit.   They were referred to the pharmacist by their PCP for assistance in managing medication access.    Subjective:  Medication Access/Adherence  Current Pharmacy:  [id number] - [place] - [address] [place]) [place] [address] Phone: [phone] Fax: [phone]  [id number] [place], [place] - [address] AT [address] [place]  [address] Phone: [phone] Fax: [phone]  [facility] - [place], [place] - [address] AT Portal to Registered [facility] Sites [address] [place] [address] Phone: [phone] Fax: [phone]  [facility] - [place], [place] - [address] [place] [address] Phone: [phone] Fax: [phone]   Patient reports affordability concerns with their medications: Yes  Patient reports access/transportation concerns to their pharmacy: No  Patient reports adherence concerns with their medications:  Yes  due to cost   Diabetes:  Current medications: Ozempic 1mg  weekly  Patient has 1 dose of Ozempic left - tomorrow is last dose she has on hand Current glucose readings: hasn't checked lately but last time was about 1 week ago. Usually between 120 and 130.  Using True Metrix meter; testing 1 or 2 times weekly   Patient denies hypoglycemic s/sx including no dizziness, shakiness, sweating. Patient reports hyperglycemic symptoms - states last night she urinated about every 45 minutes - denies fever, chills or dysuria. This morning she has had normal urination.  Denies polydipsia, polyphagia, nocturia, neuropathy, blurred  vision.  Current physical activity: not exercising much due to back pain - she had surgery last year.   Current medication access support: gets assistance with Repatha cost thru [employer]. Has also received assistance thru their Type 2 DM fund in past but this fund is not currently open.   Approved to receive Eliquis thru [employer] for 2025 (patient assistance program completed thru cardiology office) Approved to receive Ozempic from [employer] thru 02/19/2024 - has not received first delivery yet since just approved in March 2025.   Per patient she spoke with Dr Tempie Donning about meningioma last week. They are planning to do a 1 time radioaction treatment and then follow every 6 to 12 months to make sure no increase in size.    Objective:  Lab Results  Component Value Date   HGBA1C 6.3 (H) 07/17/2022    Lab Results  Component Value Date   CREATININE 0.76 07/17/2022   BUN 5 (L) 07/17/2022   NA 139 07/17/2022   K 3.9 07/17/2022   CL 105 07/17/2022   CO2 25 07/17/2022    Lab Results  Component Value Date   CHOL 187 10/26/2022   HDL 126 10/26/2022   LDLCALC 46 10/26/2022   LDLDIRECT 95.9 10/06/2012   TRIG 83 10/26/2022   CHOLHDL 1.5 10/26/2022    Medications Reviewed Today     Reviewed by Sierra Ford, RPH-CPP (Pharmacist) on 04/29/23 at [phone]  Med List Status: <None>   Medication Order Taking? Sig Documenting Provider Last Dose Status Informant  apixaban (ELIQUIS) 2.5 MG TABS tablet [id number] No Take 1 tablet (2.5 mg total) by mouth 2 (two) times daily. Rollene Rotunda, MD Taking Active Self  Med Note Clydie Braun, Oliviarose Punch B   Mon Apr 08, 2023  9:26 AM) Gets thru [employer] medication assistance  blood glucose meter kit and supplies KIT [id number] No Dispense based on patient and insurance preference. Use up to four times daily as directed. (FOR ICD-9 250.00, 250.01). Donato Schultz, DO Taking Active Self  Calcium Carb-Cholecalciferol (CALCIUM 600 + D PO) [id number]  No Take 1 tablet by mouth daily. [provider] Taking Active Self  cholecalciferol (VITAMIN D3) 25 MCG (1000 UNIT) tablet [id number] No Take 1,000 Units by mouth 2 (two) times daily. [provider] Taking Active Self  Coenzyme Q10 100 MG TABS [id number] No Take 100 mg by mouth daily. [provider] Taking Active Self  diphenhydrAMINE HCl, Sleep, (ZZZQUIL) 25 MG CAPS [id number] No Take 25 mg by mouth at bedtime as needed (sleep). [provider] Taking Active Self  Evolocumab (REPATHA SURECLICK) 140 MG/ML SOAJ [id number] No Inject 140 mg into the skin every 14 (fourteen) days. Rollene Rotunda, MD Taking Active   Flaxseed, Linseed, (FLAXSEED OIL) 1200 MG CAPS [id number] No Take 1,200 mg by mouth daily. [provider] Taking Active Self  furosemide (LASIX) 40 MG tablet [id number] No TAKE 1 TABLET DAILY  Patient taking differently: Take 40 mg by mouth daily as needed for fluid.   Rollene Rotunda, MD Taking Active   gabapentin (NEURONTIN) 300 MG capsule [id number] No Take 300 mg by mouth at bedtime as needed (pain/sleep). [provider] Taking Active Self  Insulin Pen Needle (NOVOFINE PLUS PEN NEEDLE) 32G X 4 MM MISC [id number] No As directed Donato Schultz, DO Taking Active Self  ipratropium (ATROVENT) 0.06 % nasal spray [id number]  Place 2 sprays into both nostrils 4 (four) times daily. Glenford Bayley, NP  Active   methocarbamol (ROBAXIN) 500 MG tablet [id number] No Take 1 tablet (500 mg total) by mouth 4 (four) times daily. Donato Schultz, DO Taking Active   Multiple Vitamin (MULTIVITAMIN) capsule [id number] No Take 1 capsule by mouth daily with breakfast. [provider] Taking Active Self  Discontinued 05/07/11 1124 (Discontinued by provider) Omega-3 Fatty Acids (FISH OIL) 1000 MG CAPS [id number] No Take 1,000 mg by mouth daily. [provider] Taking Active Self  OVER THE COUNTER MEDICATION [id number] No Apply 1  Application topically at bedtime. Nervive cream, apply to hands [provider] Taking Active Self  oxyCODONE (OXY IR/ROXICODONE) 5 MG immediate release tablet [id number] No Take 1 tablet (5 mg total) by mouth every 6 (six) hours as needed for moderate pain ((score 4 to 6)). Clovis Riley, PA-C Taking Active   potassium chloride SA (KLOR-CON M) 20 MEQ tablet [id number] No Take 1 tablet (20 mEq total) by mouth daily.  Patient taking differently: Take 20 mEq by mouth daily as needed (when taking lasix).   Sharlene Dory, NP Taking Active Self  Discontinued 05/07/11 1124 (Change in therapy) rosuvastatin (CRESTOR) 10 MG tablet [id number] No TAKE 1 TABLET DAILY Hochrein, Fayrene Fearing, MD Taking Active   Semaglutide, 1 MG/DOSE, 4 MG/3ML SOPN [id number] No Inject 1 mg as directed once a week. Donato Schultz, DO Taking Active            Med Note Clydie Braun, Inda Merlin Apr 29, 2023  9:51 AM) Approved for Novo patient assistance 04/2023 thru 02/19/2024  TRUE METRIX BLOOD GLUCOSE TEST test strip [id number] No TEST  UP  TO FOUR TIMES DAILY AS DIRECTED Donato Schultz, DO Taking Active  Self  TRUEPLUS LANCETS 33G MISC [id number] No TEST  UP TO FOUR TIMES DAILY AS DIRECTED Zola Button, Grayling Congress, DO Taking Active Self              Assessment/Plan:   Diabetes: - Continue Ozempic 1mg  weekly. Approved to receive Ozempic from 03/20225 thru 02/19/2024 from [employer] medication assistance program. If first shipment is not received by next week - Monday 3/17, then will provide a sample.  - Recommended patient check blood glucose 2 to 3 times per week. She is to notify office ifi FBG > 150 or ranom blood glucose > 200.   Abnormal MRI results:  - Has established with neurosurgeon and they are planning 1 time radiation treatment and to continue to follow.    Patient is due to have labs and f/u with PCP - appt made today fro 05/07/2023  Follow Up Plan: 2 to 3 months  Sierra Ford,  PharmD Clinical Pharmacist [facility]

## 2023-05-02 ENCOUNTER — Ambulatory Visit: Payer: Medicare HMO | Admitting: Cardiology

## 2023-05-03 ENCOUNTER — Telehealth: Payer: Self-pay

## 2023-05-03 NOTE — Telephone Encounter (Signed)
 Pt called and lvm and advised that Ozempic has arrived and ready for picked up. Placed in frig.

## 2023-05-07 ENCOUNTER — Ambulatory Visit: Admitting: Family Medicine

## 2023-05-07 ENCOUNTER — Encounter: Payer: Self-pay | Admitting: Family Medicine

## 2023-05-07 VITALS — BP 148/80 | HR 97 | Temp 98.0°F | Resp 18 | Ht 62.0 in | Wt 175.0 lb

## 2023-05-07 DIAGNOSIS — D329 Benign neoplasm of meninges, unspecified: Secondary | ICD-10-CM

## 2023-05-07 DIAGNOSIS — E1165 Type 2 diabetes mellitus with hyperglycemia: Secondary | ICD-10-CM | POA: Diagnosis not present

## 2023-05-07 DIAGNOSIS — E119 Type 2 diabetes mellitus without complications: Secondary | ICD-10-CM | POA: Diagnosis not present

## 2023-05-07 DIAGNOSIS — Z7985 Long-term (current) use of injectable non-insulin antidiabetic drugs: Secondary | ICD-10-CM

## 2023-05-07 DIAGNOSIS — I10 Essential (primary) hypertension: Secondary | ICD-10-CM | POA: Diagnosis not present

## 2023-05-07 DIAGNOSIS — E1169 Type 2 diabetes mellitus with other specified complication: Secondary | ICD-10-CM | POA: Diagnosis not present

## 2023-05-07 DIAGNOSIS — E785 Hyperlipidemia, unspecified: Secondary | ICD-10-CM | POA: Diagnosis not present

## 2023-05-07 NOTE — Progress Notes (Signed)
 Established Patient Office Visit  Subjective   Patient ID: Sierra Ford, female    DOB: [birth year]  Age: 83 y.o. MRN: [id number]  Chief Complaint  Patient presents with   Diabetes   Hyperlipidemia   Follow-up    HPI Discussed the use of AI scribe software for clinical note transcription with the patient, who gave verbal consent to proceed.  History of Present Illness   Sierra Ford is an 83 year old female with a meningioma who presents for follow-up and management of her condition.  A meningioma, approximately 1.5 centimeters in size, was discovered during a study at [facility]. She is currently undergoing treatment with [facility] radiosurgery, scheduled for May 14, 2023. Initially asymptomatic, she attributed her symptoms to diabetes, including worsening vision and hearing. Recently, she experienced a severe headache on Sunday morning.  She continues to experience back pain and issues with her left leg, using a cane for mobility. Current treatments have not provided complete relief for her back pain.  She has a history of diabetes, with a blood sugar reading of 110 mg/dL this morning. She manages her diabetes with Ozempic, which was recently sent to her pharmacy. She reports never having a bad reading.  She experiences some swelling in her feet and has difficulty wearing compression stockings due to her back and leg issues. She has not been taking her diuretics regularly due to the inconvenience of frequent urination.  She underwent carpal tunnel surgery on her right hand on February 22, 2023, and anticipates needing surgery on her left hand in the future.  She continues to have regular mammograms, Pap smears, and bone density tests.      Patient Active Problem List   Diagnosis Date Noted   Diabetes mellitus treated with injections of non-insulin medication (HCC) 03/06/2023   Lumbar stenosis 07/24/2022   Other spondylosis with radiculopathy, lumbar region 07/24/2022    Chronic pain of both shoulders 05/24/2022   Pain of lower extremity 05/02/2022   Uncontrolled type 2 diabetes mellitus with hyperglycemia (HCC) 03/09/2022   Acute left-sided low back pain with sciatica 03/09/2022   Former smoker 03/09/2022   Primary osteoarthritis of left hip 02/20/2022   Spinal stenosis of lumbar region 02/20/2022   Rotator cuff arthropathy of both shoulders 02/20/2022   Lower abdominal pain 04/04/2021   Rectal bleeding 04/04/2021   Change in bowel habits 04/04/2021   Internal hemorrhoids 04/04/2021   Preventative health care 03/07/2021   Sacroiliitis (HCC) 03/07/2021   Morbid (severe) obesity due to excess calories (HCC) 03/07/2021   Bronchitis 12/20/2020   Other pulmonary embolism with acute cor pulmonale, unspecified chronicity (HCC)    Educated about COVID-19 virus infection 01/26/2019   Hyperlipidemia associated with type 2 diabetes mellitus (HCC) 04/24/2018   Right ankle swelling 05/13/2017   Diabetes mellitus, type II (HCC) 04/08/2017   Primary hypertension 04/08/2017   Atherosclerosis of aorta (HCC) 04/08/2017   Abnormal auditory perception of both ears 10/01/2016   Bilateral impacted cerumen 10/01/2016   OSA (obstructive sleep apnea) 12/16/2015   DOE (dyspnea on exertion) 06/12/2013   Obesity (BMI 30-39.9) 04/24/2013   Chest pain, unspecified 01/22/2011   Abnormal stress test 01/22/2011   Frequent urination 01/22/2011   Chronic diastolic heart failure (HCC) 05/15/2009   CAROTID ARTERY DISEASE 01/12/2009   LEG EDEMA, RIGHT 07/27/2008   HX, URINARY INFECTION 09/19/2006   Hyperlipidemia LDL goal <70 08/20/2006   PREGNANCY, ECTOPIC NEC W/INTRAUTERINE PRG 08/20/2006   FREQUENCY, URINARY 08/20/2006  Other specified abnormal findings of blood chemistry 08/20/2006   Personal history of venous thrombosis and embolism 08/20/2006   Past Medical History:  Diagnosis Date   Anxiety    Back pain    Chest pain    CLite with apical ischemia in 2006 - normal  coronary arteries by [facility] in 12/2004;  Myoview 11/12:  Low risk stress nuclear study with a small, partially reversible apical defect most likely related to apical thinning; cannot R/O very mild apical ischemia.  EF: 75%    Complication of anesthesia    trouble waking after colonoscopy slept all day afterward into the next morning   Decreased hearing    Depression    Diabetes (HCC)    type II   DVT (deep venous thrombosis) (HCC) 2006   hx of and again in 2020   Ectopic pregnancy with intrauterine pregnancy    Hiatal hernia    patient not aware of hernia   Hyperlipidemia    PE (pulmonary embolism) 2006   hx of and again in 2020   Sleep apnea    CPAP    Past Surgical History:  Procedure Laterality Date   ABDOMINAL EXPLORATION SURGERY     ANTERIOR LAT LUMBAR FUSION N/A 07/24/2022   Procedure: Direct Lumbar Interbody Fusion PRONE TRANSPSOAS Lumbar three-four, Lumbar four-five;  Surgeon: Bedelia Person, MD;  Location[facility] OR;  Service: Neurosurgery;  Laterality: N/A;   BUNIONECTOMY Right 1985   right   CARPAL TUNNEL RELEASE Right 02/22/2023   ortman   COLONOSCOPY  2009   every 5 years   ECTOPIC PREGNANCY SURGERY     FOOT SURGERY Right 1985   LUMBAR PERCUTANEOUS PEDICLE SCREW 2 LEVEL N/A 07/24/2022   Procedure: LUMBAR PERCUTANEOUS PEDICLE SCREW LUMBAR THREE-FOUR, LUMBAR FOUR-FIVE;  Surgeon: Bedelia Person, MD;  Location: [facility] OR;  Service: Neurosurgery;  Laterality: N/A;   TUBAL LIGATION     Social History   Tobacco Use   Smoking status: Former    Current packs/day: 0.00    Average packs/day: 1 pack/day for 50.0 years (50.0 ttl pk-yrs)    Types: Cigarettes    Start date: 11/22/1959    Quit date: 11/21/2009    Years since quitting: 13.5   Smokeless tobacco: Never  Vaping Use   Vaping status: Never Used  Substance Use Topics   Alcohol use: Yes    Comment: rare 3 times a year per pt   Drug use: No   Social History   Socioeconomic History   Marital status: Divorced     Spouse name: Not on file   Number of children: 1   Years of education: Not on file   Highest education level: Not on file  Occupational History   Occupation: Retired Research scientist (physical sciences)  Tobacco Use   Smoking status: Former    Current packs/day: 0.00    Average packs/day: 1 pack/day for 50.0 years (50.0 ttl pk-yrs)    Types: Cigarettes    Start date: 11/22/1959    Quit date: 11/21/2009    Years since quitting: 13.5   Smokeless tobacco: Never  Vaping Use   Vaping status: Never Used  Substance and Sexual Activity   Alcohol use: Yes    Comment: rare 3 times a year per pt   Drug use: No   Sexual activity: Not Currently    Partners: Male  Other Topics Concern   Not on file  Social History Narrative   Regular exercise--- no   Social Drivers of  Health   Financial Resource Strain: Low Risk  (08/27/2022)   Overall Financial Resource Strain (CARDIA)    Difficulty of Paying Living Expenses: Not hard at all  Food Insecurity: No Food Insecurity (07/24/2022)   Hunger Vital Sign    Worried About Running Out of Food in the Last Year: Never true    Ran Out of Food in the Last Year: Never true  Transportation Needs: No Transportation Needs (07/24/2022)   PRAPARE - Administrator, Civil Service (Medical): No    Lack of Transportation (Non-Medical): No  Physical Activity: Inactive (08/27/2022)   Exercise Vital Sign    Days of Exercise per Week: 0 days    Minutes of Exercise per Session: 0 min  Stress: No Stress Concern Present (08/15/2020)   [employer] of Occupational Health - Occupational Stress Questionnaire    Feeling of Stress : Only a little  Social Connections: Moderately Integrated (08/15/2020)   Social Connection and Isolation Panel [NHANES]    Frequency of Communication with Friends and Family: More than three times a week    Frequency of Social Gatherings with Friends and Family: More than three times a week    Attends Religious Services: More than 4 times per year    Active  Member of [employer] or Organizations: Yes    Attends Banker Meetings: 1 to 4 times per year    Marital Status: Widowed  Intimate Partner Violence: Not At Risk (07/24/2022)   Humiliation, Afraid, Rape, and Kick questionnaire    Fear of Current or Ex-Partner: No    Emotionally Abused: No    Physically Abused: No    Sexually Abused: No   Family Status  Relation Name Status   Mother  Deceased at age 79       heart failure   Father  Deceased       unknown causes   Mat Aunt  (Not Specified)   Mat Aunt  (Not Specified)   MGM  Deceased   MGF  Deceased   PGM  Deceased   PGF  Deceased   Neg Hx  (Not Specified)  No partnership data on file   Family History  Problem Relation Age of Onset   Heart failure Mother        died from   Colon polyps Mother    Hypertension Mother    Sudden death Mother    Obesity Mother    Alcoholism Father    Diabetes Maternal Aunt        grandaughter   Diabetes Maternal Aunt    Colon cancer Neg Hx    Esophageal cancer Neg Hx    Stomach cancer Neg Hx    No Known Allergies    ROS    Objective:     BP (!) 148/80 (BP Location: Left Arm, Patient Position: Sitting, Cuff Size: Large)   Pulse 97   Temp 98 F (36.7 C) (Oral)   Resp 18   Ht 5\' 2"  (1.575 m)   Wt 175 lb (79.4 kg)   SpO2 95%   BMI 32.01 kg/m  BP Readings from Last 3 Encounters:  05/07/23 (!) 148/80  03/08/23 (!) 143/80  10/19/22 116/78   Wt Readings from Last 3 Encounters:  05/07/23 175 lb (79.4 kg)  03/08/23 167 lb (75.8 kg)  10/19/22 159 lb (72.1 kg)   SpO2 Readings from Last 3 Encounters:  05/07/23 95%  03/08/23 94%  10/19/22 98%      Physical Exam  No results found for any visits on 05/07/23.  Last CBC Lab Results  Component Value Date   WBC 4.6 07/17/2022   HGB 14.3 07/17/2022   HCT 44.1 07/17/2022   MCV 95.7 07/17/2022   MCH 31.0 07/17/2022   RDW 13.2 07/17/2022   PLT 258 07/17/2022   Last metabolic panel Lab Results  Component Value Date    GLUCOSE 102 (H) 05/07/2023   NA 141 05/07/2023   K 4.5 05/07/2023   CL 105 05/07/2023   CO2 27 05/07/2023   BUN 10 05/07/2023   CREATININE 0.62 05/07/2023   GFR 83.39 05/07/2023   CALCIUM 10.2 05/07/2023   PROT 7.0 05/07/2023   ALBUMIN 4.5 05/07/2023   LABGLOB 2.4 01/28/2018   AGRATIO 2.1 01/28/2018   BILITOT 1.0 05/07/2023   ALKPHOS 134 (H) 05/07/2023   AST 19 05/07/2023   ALT 19 05/07/2023   ANIONGAP 9 07/17/2022   Last lipids Lab Results  Component Value Date   CHOL 227 (H) 05/07/2023   HDL 144.70 05/07/2023   LDLCALC 69 05/07/2023   LDLDIRECT 95.9 10/06/2012   TRIG 66.0 05/07/2023   CHOLHDL 2 05/07/2023   Last hemoglobin A1c Lab Results  Component Value Date   HGBA1C 6.2 05/07/2023   Last thyroid functions Lab Results  Component Value Date   TSH 1.250 05/27/2017   T3TOTAL 81 05/27/2017   Last vitamin D Lab Results  Component Value Date   VD25OH 39.1 01/28/2018   Last vitamin B12 and Folate No results found for: "VITAMINB12", "FOLATE"    The ASCVD Risk score (Arnett DK, et al., 2019) failed to calculate for the following reasons:   The 2019 ASCVD risk score is only valid for ages 104 to 75    Assessment & Plan:   Problem List Items Addressed This Visit       Unprioritized   Uncontrolled type 2 diabetes mellitus with hyperglycemia (HCC)   Relevant Orders   Comprehensive metabolic panel   Lipid panel   Hemoglobin A1c   Microalbumin / creatinine urine ratio   Primary hypertension   Hyperlipidemia associated with type 2 diabetes mellitus (HCC)   Relevant Orders   Comprehensive metabolic panel   Lipid panel   Diabetes mellitus treated with injections of non-insulin medication (HCC) - Primary   Relevant Orders   Comprehensive metabolic panel   Hemoglobin A1c   Microalbumin / creatinine urine ratio   Other Visit Diagnoses       Meningioma (HCC)         Assessment and Plan    Meningioma   A 1.5 cm non-cancerous meningioma was discovered  in her brain during an MRI. She is scheduled for [facility] radiosurgery on May 14, 2023, to reduce or halt tumor growth. She recently experienced a headache but no other significant symptoms. She is concerned about potential hearing and vision issues, which may or may not improve post-procedure. The procedure is expected to be a one-time treatment. Proceed with Gamma Knife radiosurgery on May 14, 2023.  Chronic Back and Knee Pain   Ongoing back and knee pain affects her mobility, requiring the use of a cane. She struggles with daily activities, such as putting on compression stockings, due to these issues. Recommend compression stockings with Velcro for easier application.  Diabetes Mellitus   Her diabetes is well-controlled with a recent blood glucose reading of 110 mg/dL. She is on Ozempic, managed by the pharmacy, and reports no adverse symptoms. Ensure Ozempic is available for her.  Carpal Tunnel Syndrome   She underwent carpal tunnel surgery on the right hand on February 22, 2023, and anticipates needing surgery on the left hand in the future.  General Health Maintenance   She is up to date with mammograms and Pap smears, performed by [facility]. She is aware of the recommended frequency for bone density tests and Pap smears under Medicare guidelines and plans to verify her schedule for the bone density test. Continue regular mammograms and Pap smears as per schedule. Verify schedule for bone density test.  Follow-up   She is actively engaged in her healthcare management and follows up with multiple specialists. Perform lab work as discussed. Follow up on lab results and provide comments to her.        No follow-ups on file.    Donato Schultz, DO

## 2023-05-08 LAB — LIPID PANEL
Cholesterol: 227 mg/dL — ABNORMAL HIGH (ref 0–200)
HDL: 144.7 mg/dL (ref >39.00)
LDL Cholesterol: 69 mg/dL (ref 0–99)
NonHDL: 82.48
Total CHOL/HDL Ratio: 2
Triglycerides: 66 mg/dL (ref 0.0–149.0)
VLDL: 13.2 mg/dL (ref 0.0–40.0)

## 2023-05-08 LAB — COMPREHENSIVE METABOLIC PANEL
ALT: 19 U/L (ref 0–35)
AST: 19 U/L (ref 0–37)
Albumin: 4.5 g/dL (ref 3.5–5.2)
Alkaline Phosphatase: 134 U/L — ABNORMAL HIGH (ref 39–117)
BUN: 10 mg/dL (ref 6–23)
CO2: 27 meq/L (ref 19–32)
Calcium: 10.2 mg/dL (ref 8.4–10.5)
Chloride: 105 meq/L (ref 96–112)
Creatinine, Ser: 0.62 mg/dL (ref 0.40–1.20)
GFR: 83.39 mL/min (ref >60.00)
Glucose, Bld: 102 mg/dL — ABNORMAL HIGH (ref 70–99)
Potassium: 4.5 meq/L (ref 3.5–5.1)
Sodium: 141 meq/L (ref 135–145)
Total Bilirubin: 1 mg/dL (ref 0.2–1.2)
Total Protein: 7 g/dL (ref 6.0–8.3)

## 2023-05-08 LAB — HEMOGLOBIN A1C: Hgb A1c MFr Bld: 6.2 % (ref 4.6–6.5)

## 2023-05-08 LAB — MICROALBUMIN / CREATININE URINE RATIO
Creatinine,U: 121.4 mg/dL
Microalb Creat Ratio: 39.3 mg/g — ABNORMAL HIGH (ref 0.0–30.0)
Microalb, Ur: 4.8 mg/dL — ABNORMAL HIGH (ref 0.0–1.9)

## 2023-05-10 ENCOUNTER — Encounter: Payer: Self-pay | Admitting: Family Medicine

## 2023-05-14 DIAGNOSIS — Z510 Encounter for antineoplastic radiation therapy: Secondary | ICD-10-CM | POA: Diagnosis not present

## 2023-05-14 DIAGNOSIS — D329 Benign neoplasm of meninges, unspecified: Secondary | ICD-10-CM | POA: Diagnosis not present

## 2023-05-14 DIAGNOSIS — D333 Benign neoplasm of cranial nerves: Secondary | ICD-10-CM | POA: Diagnosis not present

## 2023-05-14 HISTORY — PX: BRAIN MENINGIOMA EXCISION: SHX576

## 2023-05-15 DIAGNOSIS — Z1231 Encounter for screening mammogram for malignant neoplasm of breast: Secondary | ICD-10-CM | POA: Diagnosis not present

## 2023-05-15 DIAGNOSIS — Z6832 Body mass index (BMI) 32.0-32.9, adult: Secondary | ICD-10-CM | POA: Diagnosis not present

## 2023-05-15 DIAGNOSIS — Z124 Encounter for screening for malignant neoplasm of cervix: Secondary | ICD-10-CM | POA: Diagnosis not present

## 2023-06-19 NOTE — Progress Notes (Signed)
  Cardiology Office Note:   Date:  06/20/2023  ID:  Sierra Ford, DOB [birth year], MRN [id number] PCP: Sierra Ford, Sierra Chalk, DO  [facility] Providers Cardiologist:  Eilleen Grates, MD {  History of Present Illness:   Sierra Ford is a 83 y.o. female who presents for follow up of diastolic dysfunction and abnormal echo. Echo 04/16/17  demonstrated an EF of 65% I saw her last year and she returns for follow up.   She had a pulmonary embolism.   She had RV strain which eventually recovered.   Since I last saw her she has had no new cardiovascular complaints.  She has had to be treated for meningioma with gamma knife surgery.  She has had carpal tunnel surgery.  She had back surgery with 7 days of hospitalization and 21 days of rehab.  Unfortunately she is still having a lot of back pain and walking with a cane or using a wheelchair  The patient denies any new symptoms such as chest discomfort, neck or arm discomfort. There has been no new shortness of breath, PND or orthopnea. There have been no reported palpitations, presyncope or syncope.  ROS: As stated in the HPI and negative for all other systems.  Studies Reviewed:    EKG:   EKG Interpretation Date/Time:  Thursday Jun 20 2023 11:57:44 EDT Ventricular Rate:  73 PR Interval:  120 QRS Duration:  104 QT Interval:  424 QTC Calculation: 467 R Axis:   -52  Text Interpretation: Normal sinus rhythm Left axis deviation Left ventricular hypertrophy Cannot rule out Septal infarct , age undetermined When compared with ECG of 27-Jan-2019 17:09, No significant change since last tracing Confirmed by Eilleen Grates ([id number]) on 06/20/2023 11:59:45 AM     Risk Assessment/Calculations:              Physical Exam:   VS:  BP 132/80   Pulse 73   Ht 5\' 2"  (1.575 m)   Wt 177 lb 9.6 oz (80.6 kg)   SpO2 94%   BMI 32.48 kg/m    Wt Readings from Last 3 Encounters:  06/20/23 177 lb 9.6 oz (80.6 kg)  05/07/23 175 lb (79.4 kg)  03/08/23  167 lb (75.8 kg)     GEN: Well nourished, well developed in no acute distress NECK: No JVD; No carotid bruits CARDIAC: RRR, no murmurs, rubs, gallops RESPIRATORY:  Clear to auscultation without rales, wheezing or rhonchi  ABDOMEN: Soft, non-tender, non-distended EXTREMITIES:  No edema; No deformity   ASSESSMENT AND PLAN:   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  No change in therapy.   DIASTOLIC HF: She seems to be euvolemic.  No change in therapy.   SLEEP APNEA:   She uses her CPAP.  No change in therapy.   DYSLIPIDEMIA:   LDL is 69.  No change in therapy.       Follow up with me in 18 months.   Signed, Eilleen Grates, MD

## 2023-06-20 ENCOUNTER — Ambulatory Visit: Payer: Medicare HMO | Attending: Cardiology | Admitting: Cardiology

## 2023-06-20 ENCOUNTER — Encounter: Payer: Self-pay | Admitting: Cardiology

## 2023-06-20 VITALS — BP 132/80 | HR 73 | Ht 62.0 in | Wt 177.6 lb

## 2023-06-20 DIAGNOSIS — E785 Hyperlipidemia, unspecified: Secondary | ICD-10-CM | POA: Diagnosis not present

## 2023-06-20 DIAGNOSIS — I2699 Other pulmonary embolism without acute cor pulmonale: Secondary | ICD-10-CM | POA: Diagnosis not present

## 2023-06-20 DIAGNOSIS — I5032 Chronic diastolic (congestive) heart failure: Secondary | ICD-10-CM

## 2023-06-20 DIAGNOSIS — G4730 Sleep apnea, unspecified: Secondary | ICD-10-CM

## 2023-06-20 NOTE — Patient Instructions (Signed)
 Medication Instructions:  Your physician recommends that you continue on your current medications as directed. Please refer to the Current Medication list given to you today.    *If you need a refill on your cardiac medications before your next appointment, please call your pharmacy*   Lab Work: None    If you have labs (blood work) drawn today and your tests are completely normal, you will receive your results only by: MyChart Message (if you have MyChart) OR A paper copy in the mail If you have any lab test that is abnormal or we need to change your treatment, we will call you to review the results.   Testing/Procedures: None    Follow-Up: At [facility], you and your health needs are our priority.  As part of our continuing mission to provide you with exceptional heart care, we have created designated Provider Care Teams.  These Care Teams include your primary Cardiologist (physician) and Advanced Practice Providers (APPs -  Physician Assistants and Nurse Practitioners) who all work together to provide you with the care you need, when you need it.  We recommend signing up for the patient portal called "MyChart".  Sign up information is provided on this After Visit Summary.  MyChart is used to connect with patients for Virtual Visits (Telemedicine).  Patients are able to view lab/test results, encounter notes, upcoming appointments, etc.  Non-urgent messages can be sent to your provider as well.   To learn more about what you can do with MyChart, go to [email or web address].    Your next appointment:   18 months   The format for your next appointment:   In Person  Provider:   Eilleen Grates, MD   Other Instructions

## 2023-06-21 DIAGNOSIS — H524 Presbyopia: Secondary | ICD-10-CM | POA: Diagnosis not present

## 2023-06-21 DIAGNOSIS — E119 Type 2 diabetes mellitus without complications: Secondary | ICD-10-CM | POA: Diagnosis not present

## 2023-07-01 ENCOUNTER — Ambulatory Visit: Admitting: Family Medicine

## 2023-07-02 ENCOUNTER — Encounter: Payer: Self-pay | Admitting: Family Medicine

## 2023-07-02 ENCOUNTER — Ambulatory Visit (INDEPENDENT_AMBULATORY_CARE_PROVIDER_SITE_OTHER): Admitting: Family Medicine

## 2023-07-02 VITALS — BP 140/80 | HR 93 | Temp 98.2°F | Resp 20 | Ht 62.0 in | Wt 179.6 lb

## 2023-07-02 DIAGNOSIS — H60331 Swimmer's ear, right ear: Secondary | ICD-10-CM

## 2023-07-02 DIAGNOSIS — R52 Pain, unspecified: Secondary | ICD-10-CM | POA: Diagnosis not present

## 2023-07-02 DIAGNOSIS — M25532 Pain in left wrist: Secondary | ICD-10-CM | POA: Diagnosis not present

## 2023-07-02 MED ORDER — OFLOXACIN 0.3 % OT SOLN
10.0000 [drp] | Freq: Every day | OTIC | 0 refills | Status: AC
Start: 2023-07-02 — End: ?

## 2023-07-02 NOTE — Progress Notes (Signed)
 Established Patient Office Visit  Subjective   Patient ID: Sierra Ford, female    DOB: 05-26-1940  Age: 83 y.o. MRN: [id number]  Chief Complaint  Patient presents with   Cerumen Impaction    HPI Discussed the use of AI scribe software for clinical note transcription with the patient, who gave verbal consent to proceed.  History of Present Illness Sierra Ford is an 83 year old female who presents with left arm pain.  She is experiencing pain in her left arm, distinct from her previous carpal tunnel symptoms. The pain is located on the back of her arm, worsening, and radiating up towards her elbow but not past it. No numbness or tingling in her fingers.  She has a history of back surgery, carpal tunnel surgery on her right arm, and treatment for a meningioma with gamma knife therapy. She has not had further problems with the meningioma and is scheduled for a follow-up in November.  She mentions having cataracts and has opted to change her glasses first before considering cataract surgery. She has also had a hearing test and notes that her left side is not functioning well.  She humorously notes that since turning [age], she feels like she has 'just fell apart' with various health issues arising.   Patient Active Problem List   Diagnosis Date Noted   Diabetes mellitus treated with injections of non-insulin  medication (HCC) 03/06/2023   Lumbar stenosis 07/24/2022   Other spondylosis with radiculopathy, lumbar region 07/24/2022   Chronic pain of both shoulders 05/24/2022   Pain of lower extremity 05/02/2022   Uncontrolled type 2 diabetes mellitus with hyperglycemia (HCC) 03/09/2022   Acute left-sided low back pain with sciatica 03/09/2022   Former smoker 03/09/2022   Primary osteoarthritis of left hip 02/20/2022   Spinal stenosis of lumbar region 02/20/2022   Rotator cuff arthropathy of both shoulders 02/20/2022   Lower abdominal pain 04/04/2021   Rectal bleeding  04/04/2021   Change in bowel habits 04/04/2021   Internal hemorrhoids 04/04/2021   Preventative health care 03/07/2021   Sacroiliitis (HCC) 03/07/2021   Morbid (severe) obesity due to excess calories (HCC) 03/07/2021   Bronchitis 12/20/2020   Other pulmonary embolism with acute cor pulmonale, unspecified chronicity (HCC)    Educated about COVID-19 virus infection 01/26/2019   Hyperlipidemia associated with type 2 diabetes mellitus (HCC) 04/24/2018   Right ankle swelling 05/13/2017   Diabetes mellitus, type II (HCC) 04/08/2017   Primary hypertension 04/08/2017   Atherosclerosis of aorta (HCC) 04/08/2017   Abnormal auditory perception of both ears 10/01/2016   Bilateral impacted cerumen 10/01/2016   OSA (obstructive sleep apnea) 12/16/2015   DOE (dyspnea on exertion) 06/12/2013   Obesity (BMI 30-39.9) 04/24/2013   Chest pain, unspecified 01/22/2011   Abnormal stress test 01/22/2011   Frequent urination 01/22/2011   Chronic diastolic heart failure (HCC) 05/15/2009   CAROTID ARTERY DISEASE 01/12/2009   LEG EDEMA, RIGHT 07/27/2008   HX, URINARY INFECTION 09/19/2006   Hyperlipidemia LDL goal <70 08/20/2006   PREGNANCY, ECTOPIC NEC W/INTRAUTERINE PRG 08/20/2006   FREQUENCY, URINARY 08/20/2006   Other specified abnormal findings of blood chemistry 08/20/2006   Personal history of venous thrombosis and embolism 08/20/2006   Past Medical History:  Diagnosis Date   Anxiety    Back pain    Chest pain    CLite with apical ischemia in 2006 - normal coronary arteries by [facility] in 12/2004;  Myoview  11/12:  Low risk stress nuclear study with a small,  partially reversible apical defect most likely related to apical thinning; cannot R/O very mild apical ischemia.  EF: 75%    Complication of anesthesia    trouble waking after colonoscopy slept all day afterward into the next morning   Decreased hearing    Depression    Diabetes (HCC)    type II   DVT (deep venous thrombosis) (HCC) 2006   hx of  and again in 2020   Ectopic pregnancy with intrauterine pregnancy    Hiatal hernia    patient not aware of hernia   Hyperlipidemia    PE (pulmonary embolism) 2006   hx of and again in 2020   Sleep apnea    CPAP    Past Surgical History:  Procedure Laterality Date   ABDOMINAL EXPLORATION SURGERY     ANTERIOR LAT LUMBAR FUSION N/A 07/24/2022   Procedure: Direct Lumbar Interbody Fusion PRONE TRANSPSOAS Lumbar three-four, Lumbar four-five;  Surgeon: Van Gelinas, MD;  Location: [facility] OR;  Service: Neurosurgery;  Laterality: N/A;   BUNIONECTOMY Right 1985   right   CARPAL TUNNEL RELEASE Right 02/22/2023   ortman   COLONOSCOPY  2009   every 5 years   ECTOPIC PREGNANCY SURGERY     FOOT SURGERY Right 1985   LUMBAR PERCUTANEOUS PEDICLE SCREW 2 LEVEL N/A 07/24/2022   Procedure: LUMBAR PERCUTANEOUS PEDICLE SCREW LUMBAR THREE-FOUR, LUMBAR FOUR-FIVE;  Surgeon: Van Gelinas, MD;  Location: [facility] OR;  Service: Neurosurgery;  Laterality: N/A;   TUBAL LIGATION     Social History   Tobacco Use   Smoking status: Former    Current packs/day: 0.00    Average packs/day: 1 pack/day for 50.0 years (50.0 ttl pk-yrs)    Types: Cigarettes    Start date: 11/22/1959    Quit date: 11/21/2009    Years since quitting: 13.6   Smokeless tobacco: Never  Vaping Use   Vaping status: Never Used  Substance Use Topics   Alcohol use: Yes    Comment: rare 3 times a year per pt   Drug use: No   Social History   Socioeconomic History   Marital status: Divorced    Spouse name: Not on file   Number of children: 1   Years of education: Not on file   Highest education level: Not on file  Occupational History   Occupation: Retired Research scientist (physical sciences)  Tobacco Use   Smoking status: Former    Current packs/day: 0.00    Average packs/day: 1 pack/day for 50.0 years (50.0 ttl pk-yrs)    Types: Cigarettes    Start date: 11/22/1959    Quit date: 11/21/2009    Years since quitting: 13.6   Smokeless tobacco: Never   Vaping Use   Vaping status: Never Used  Substance and Sexual Activity   Alcohol use: Yes    Comment: rare 3 times a year per pt   Drug use: No   Sexual activity: Not Currently    Partners: Male  Other Topics Concern   Not on file  Social History Narrative   Regular exercise--- no   Social Drivers of Health   Financial Resource Strain: Low Risk  (08/27/2022)   Overall Financial Resource Strain (CARDIA)    Difficulty of Paying Living Expenses: Not hard at all  Food Insecurity: No Food Insecurity (07/24/2022)   Hunger Vital Sign    Worried About Running Out of Food in the Last Year: Never true    Ran Out of Food in the Last Year: Never  true  Transportation Needs: No Transportation Needs (07/24/2022)   PRAPARE - Administrator, Civil Service (Medical): No    Lack of Transportation (Non-Medical): No  Physical Activity: Inactive (08/27/2022)   Exercise Vital Sign    Days of Exercise per Week: 0 days    Minutes of Exercise per Session: 0 min  Stress: No Stress Concern Present (08/15/2020)   [employer] of Occupational Health - Occupational Stress Questionnaire    Feeling of Stress : Only a little  Social Connections: Moderately Integrated (08/15/2020)   Social Connection and Isolation Panel [NHANES]    Frequency of Communication with Friends and Family: More than three times a week    Frequency of Social Gatherings with Friends and Family: More than three times a week    Attends Religious Services: More than 4 times per year    Active Member of [employer] or Organizations: Yes    Attends Banker Meetings: 1 to 4 times per year    Marital Status: Widowed  Intimate Partner Violence: Not At Risk (07/24/2022)   Humiliation, Afraid, Rape, and Kick questionnaire    Fear of Current or Ex-Partner: No    Emotionally Abused: No    Physically Abused: No    Sexually Abused: No   Family Status  Relation Name Status   Mother  Deceased at age 68       heart failure    Father  Deceased       unknown causes   Mat Aunt  (Not Specified)   Mat Aunt  (Not Specified)   MGM  Deceased   MGF  Deceased   PGM  Deceased   PGF  Deceased   Neg Hx  (Not Specified)  No partnership data on file   Family History  Problem Relation Age of Onset   Heart failure Mother        died from   Colon polyps Mother    Hypertension Mother    Sudden death Mother    Obesity Mother    Alcoholism Father    Diabetes Maternal Aunt        grandaughter   Diabetes Maternal Aunt    Colon cancer Neg Hx    Esophageal cancer Neg Hx    Stomach cancer Neg Hx    No Known Allergies    Review of Systems  Constitutional:  Negative for chills, fever and malaise/fatigue.  HENT:  Negative for congestion and hearing loss.   Eyes:  Negative for blurred vision and discharge.  Respiratory:  Negative for cough, sputum production and shortness of breath.   Cardiovascular:  Negative for chest pain, palpitations and leg swelling.  Gastrointestinal:  Negative for abdominal pain, blood in stool, constipation, diarrhea, heartburn, nausea and vomiting.  Genitourinary:  Negative for dysuria, frequency, hematuria and urgency.  Musculoskeletal:  Negative for back pain, falls and myalgias.  Skin:  Negative for rash.  Neurological:  Negative for dizziness, sensory change, loss of consciousness, weakness and headaches.  Endo/Heme/Allergies:  Negative for environmental allergies. Does not bruise/bleed easily.  Psychiatric/Behavioral:  Negative for depression and suicidal ideas. The patient is not nervous/anxious and does not have insomnia.       Objective:     BP (!) 140/80 (BP Location: Left Arm, Patient Position: Sitting)   Pulse 93   Temp 98.2 F (36.8 C) (Oral)   Resp 20   Ht 5\' 2"  (1.575 m)   Wt 179 lb 9.6 oz (81.5 kg)   SpO2  98%   BMI 32.85 kg/m  BP Readings from Last 3 Encounters:  07/02/23 (!) 140/80  06/20/23 132/80  05/07/23 (!) 148/80   Wt Readings from Last 3 Encounters:   07/02/23 179 lb 9.6 oz (81.5 kg)  06/20/23 177 lb 9.6 oz (80.6 kg)  05/07/23 175 lb (79.4 kg)   SpO2 Readings from Last 3 Encounters:  07/02/23 98%  06/20/23 94%  05/07/23 95%      Physical Exam Vitals and nursing note reviewed.  HENT:     Right Ear: There is impacted cerumen.     Left Ear: There is impacted cerumen.   Unable to remove with hoop---- irrigated with no complications  Caaal erythema--- R>L   No results found for any visits on 07/02/23.  Last CBC Lab Results  Component Value Date   WBC 4.6 07/17/2022   HGB 14.3 07/17/2022   HCT 44.1 07/17/2022   MCV 95.7 07/17/2022   MCH 31.0 07/17/2022   RDW 13.2 07/17/2022   PLT 258 07/17/2022   Last metabolic panel Lab Results  Component Value Date   GLUCOSE 102 (H) 05/07/2023   NA 141 05/07/2023   K 4.5 05/07/2023   CL 105 05/07/2023   CO2 27 05/07/2023   BUN 10 05/07/2023   CREATININE 0.62 05/07/2023   GFR 83.39 05/07/2023   CALCIUM  10.2 05/07/2023   PROT 7.0 05/07/2023   ALBUMIN 4.5 05/07/2023   LABGLOB 2.4 01/28/2018   AGRATIO 2.1 01/28/2018   BILITOT 1.0 05/07/2023   ALKPHOS 134 (H) 05/07/2023   AST 19 05/07/2023   ALT 19 05/07/2023   ANIONGAP 9 07/17/2022   Last lipids Lab Results  Component Value Date   CHOL 227 (H) 05/07/2023   HDL 144.70 05/07/2023   LDLCALC 69 05/07/2023   LDLDIRECT 95.9 10/06/2012   TRIG 66.0 05/07/2023   CHOLHDL 2 05/07/2023   Last hemoglobin A1c Lab Results  Component Value Date   HGBA1C 6.2 05/07/2023   Last thyroid  functions Lab Results  Component Value Date   TSH 1.250 05/27/2017   T3TOTAL 81 05/27/2017   Last vitamin D  Lab Results  Component Value Date   VD25OH 39.1 01/28/2018   Last vitamin B12 and Folate No results found for: "VITAMINB12", "FOLATE"    The ASCVD Risk score (Arnett DK, et al., 2019) failed to calculate for the following reasons:   The 2019 ASCVD risk score is only valid for ages 48 to 4    Assessment & Plan:   Problem List  Items Addressed This Visit   None Visit Diagnoses       Acute swimmer's ear of right side    -  Primary   Relevant Medications   ofloxacin (FLOXIN) 0.3 % OTIC solution      Assessment and Plan Assessment & Plan Meningioma   Treated with gamma knife surgery, successfully halting tumor growth. No further issues reported. Follow-up in November to monitor condition.  Cataracts   Bilateral cataracts present. She opted to change glasses to address vision issues before considering surgery. Pick up new glasses.   No follow-ups on file.    Herley Bernardini R Lowne Chase, DO

## 2023-07-05 DIAGNOSIS — Z6832 Body mass index (BMI) 32.0-32.9, adult: Secondary | ICD-10-CM | POA: Diagnosis not present

## 2023-07-05 DIAGNOSIS — M4316 Spondylolisthesis, lumbar region: Secondary | ICD-10-CM | POA: Diagnosis not present

## 2023-07-08 ENCOUNTER — Encounter: Payer: Self-pay | Admitting: Pharmacist

## 2023-07-08 NOTE — Progress Notes (Signed)
 Pharmacy Quality Measure Review  This patient is appearing on a report for being at risk of failing the adherence measure for cholesterol (statin) and diabetes medications this calendar year.   Medication: Ozempic  Last fill date: 03/31/2023 for 28 day supply  Patient was approved to get Ozempic  thru [employer] medication assistance program 05/06/2023 thru 02/19/2024   Medication: rosuvastatin   Last fill date: 04/29/2023 for 90 day supply    Plan:  Patient is taking Ozempic  1mg  weekly as prescribed. Unfortunately because patient's initial application for Ozempic  was denied she had to fill twice before approved. She will fail MAD for 2025.  She has filled cholesterol medication / rosuvastatin  on time this year so far.   Cecilie Coffee, PharmD Clinical Pharmacist [facility]  Population Health [phone]

## 2023-07-09 ENCOUNTER — Telehealth: Payer: Self-pay | Admitting: Family Medicine

## 2023-07-09 NOTE — Telephone Encounter (Signed)
 Copied from CRM [id number]. Topic: Referral - Request for Referral >> Jul 09, 2023  9:51 AM Artemio Larry wrote: Gregary Lean from [facility] called, patient needs back date referral date of service 06/21/23 put in to Dr. Dione Franks. Please fax to [facility] at [phone]. [place] call back number is [phone].

## 2023-07-16 DIAGNOSIS — M25532 Pain in left wrist: Secondary | ICD-10-CM | POA: Diagnosis not present

## 2023-07-23 ENCOUNTER — Other Ambulatory Visit: Payer: Self-pay | Admitting: Surgery

## 2023-07-29 ENCOUNTER — Telehealth

## 2023-07-29 ENCOUNTER — Telehealth: Payer: Self-pay | Admitting: Pharmacist

## 2023-07-29 NOTE — Telephone Encounter (Signed)
 Attempt was made to contact patient by phone today for follow up by Clinical Pharmacist regarding follow up diabetes and medication management..  Unable to reach patient. LM on VM with my contact number [phone].

## 2023-08-02 NOTE — Telephone Encounter (Signed)
 Patient returned Clinical Pharmacist Practitioner phone call. She LM on VM that she hs 3 doses of Ozempic  left. Last dose was 08/01/2023 so she should have enough thru 08/22/2023.  Called [employer] and her next refill / delivery is in process but no expected date for shipment available yet.  Patient notified.

## 2023-08-07 ENCOUNTER — Telehealth

## 2023-08-07 NOTE — Telephone Encounter (Unsigned)
 Copied from CRM [id number]. Topic: Appointments - Scheduling Inquiry for Clinic >> Aug 07, 2023  1:43 PM Sierra Ford wrote: Reason for CRM: Patient called in wanting to cancel pharmacist telephone call today

## 2023-08-08 ENCOUNTER — Encounter: Payer: Self-pay | Admitting: Family Medicine

## 2023-08-08 DIAGNOSIS — M25532 Pain in left wrist: Secondary | ICD-10-CM | POA: Diagnosis not present

## 2023-08-15 DIAGNOSIS — M25532 Pain in left wrist: Secondary | ICD-10-CM | POA: Diagnosis not present

## 2023-08-29 ENCOUNTER — Ambulatory Visit (INDEPENDENT_AMBULATORY_CARE_PROVIDER_SITE_OTHER): Admitting: Family Medicine

## 2023-08-29 ENCOUNTER — Encounter: Payer: Self-pay | Admitting: Family Medicine

## 2023-08-29 ENCOUNTER — Ambulatory Visit: Payer: Self-pay | Admitting: Family Medicine

## 2023-08-29 VITALS — BP 134/80 | HR 82 | Temp 98.2°F | Resp 18 | Ht 62.0 in | Wt 179.6 lb

## 2023-08-29 DIAGNOSIS — Z7985 Long-term (current) use of injectable non-insulin antidiabetic drugs: Secondary | ICD-10-CM | POA: Diagnosis not present

## 2023-08-29 DIAGNOSIS — E1165 Type 2 diabetes mellitus with hyperglycemia: Secondary | ICD-10-CM

## 2023-08-29 DIAGNOSIS — I10 Essential (primary) hypertension: Secondary | ICD-10-CM | POA: Diagnosis not present

## 2023-08-29 DIAGNOSIS — Z0000 Encounter for general adult medical examination without abnormal findings: Secondary | ICD-10-CM | POA: Diagnosis not present

## 2023-08-29 DIAGNOSIS — E785 Hyperlipidemia, unspecified: Secondary | ICD-10-CM | POA: Diagnosis not present

## 2023-08-29 DIAGNOSIS — E1169 Type 2 diabetes mellitus with other specified complication: Secondary | ICD-10-CM | POA: Diagnosis not present

## 2023-08-29 DIAGNOSIS — E119 Type 2 diabetes mellitus without complications: Secondary | ICD-10-CM

## 2023-08-29 LAB — CBC WITH DIFFERENTIAL/PLATELET
Basophils Absolute: 0 K/uL (ref 0.0–0.1)
Basophils Relative: 0.8 % (ref 0.0–3.0)
Eosinophils Absolute: 0.1 K/uL (ref 0.0–0.7)
Eosinophils Relative: 2 % (ref 0.0–5.0)
HCT: 43 % (ref 36.0–46.0)
Hemoglobin: 14.3 g/dL (ref 12.0–15.0)
Lymphocytes Relative: 35.3 % (ref 12.0–46.0)
Lymphs Abs: 1.5 K/uL (ref 0.7–4.0)
MCHC: 33.2 g/dL (ref 30.0–36.0)
MCV: 96.3 fl (ref 78.0–100.0)
Monocytes Absolute: 0.4 K/uL (ref 0.1–1.0)
Monocytes Relative: 9.7 % (ref 3.0–12.0)
Neutro Abs: 2.2 K/uL (ref 1.4–7.7)
Neutrophils Relative %: 52.2 % (ref 43.0–77.0)
Platelets: 232 K/uL (ref 150.0–400.0)
RBC: 4.47 Mil/uL (ref 3.87–5.11)
RDW: 13.5 % (ref 11.5–15.5)
WBC: 4.2 K/uL (ref 4.0–10.5)

## 2023-08-29 LAB — COMPREHENSIVE METABOLIC PANEL WITH GFR
ALT: 16 U/L (ref 0–35)
AST: 19 U/L (ref 0–37)
Albumin: 4.3 g/dL (ref 3.5–5.2)
Alkaline Phosphatase: 116 U/L (ref 39–117)
BUN: 11 mg/dL (ref 6–23)
CO2: 29 meq/L (ref 19–32)
Calcium: 9.8 mg/dL (ref 8.4–10.5)
Chloride: 101 meq/L (ref 96–112)
Creatinine, Ser: 0.74 mg/dL (ref 0.40–1.20)
GFR: 75.6 mL/min (ref >60.00)
Glucose, Bld: 109 mg/dL — ABNORMAL HIGH (ref 70–99)
Potassium: 4.3 meq/L (ref 3.5–5.1)
Sodium: 139 meq/L (ref 135–145)
Total Bilirubin: 0.8 mg/dL (ref 0.2–1.2)
Total Protein: 6.6 g/dL (ref 6.0–8.3)

## 2023-08-29 LAB — MICROALBUMIN / CREATININE URINE RATIO
Creatinine,U: 178.9 mg/dL
Microalb Creat Ratio: 18.9 mg/g (ref 0.0–30.0)
Microalb, Ur: 3.4 mg/dL — ABNORMAL HIGH (ref 0.0–1.9)

## 2023-08-29 LAB — TSH: TSH: 0.91 u[IU]/mL (ref 0.35–5.50)

## 2023-08-29 LAB — HEMOGLOBIN A1C: Hgb A1c MFr Bld: 6.3 % (ref 4.6–6.5)

## 2023-08-29 LAB — LIPID PANEL
Cholesterol: 187 mg/dL (ref 0–200)
HDL: 123.6 mg/dL (ref >39.00)
LDL Cholesterol: 54 mg/dL (ref 0–99)
NonHDL: 63.79
Total CHOL/HDL Ratio: 2
Triglycerides: 50 mg/dL (ref 0.0–149.0)
VLDL: 10 mg/dL (ref 0.0–40.0)

## 2023-08-29 MED ORDER — POTASSIUM CHLORIDE CRYS ER 20 MEQ PO TBCR: 20.0000 meq | EXTENDED_RELEASE_TABLET | Freq: Every day | ORAL | 3 refills | Status: AC

## 2023-08-29 NOTE — Assessment & Plan Note (Signed)
 Well controlled, no changes to meds. Encouraged heart healthy diet such as the DASH diet and exercise as tolerated.

## 2023-08-29 NOTE — Assessment & Plan Note (Signed)
 Ghm utd Check labs  See AVS Health Maintenance  Topic Date Due   DEXA SCAN  07/14/2021   OPHTHALMOLOGY EXAM  10/20/2021   Medicare Annual Wellness (AWV)  08/27/2023   COVID-19 Vaccine (8 - 2024-25 season) 08/07/2024 (Originally 08/02/2023)   INFLUENZA VACCINE  09/20/2023   HEMOGLOBIN A1C  11/07/2023   Diabetic kidney evaluation - eGFR measurement  05/06/2024   Diabetic kidney evaluation - Urine ACR  05/06/2024   FOOT EXAM  05/06/2024   DTaP/Tdap/Td (4 - Td or Tdap) 11/05/2028   Pneumococcal Vaccine: 50+ Years  Completed   Zoster Vaccines- Shingrix   Completed   Hepatitis B Vaccines  Aged Out   HPV VACCINES  Aged Out   Meningococcal B Vaccine  Aged Out   Lung Cancer Screening  Discontinued   Hepatitis C Screening  Discontinued

## 2023-08-29 NOTE — Progress Notes (Signed)
 Established Patient Office Visit  Subjective   Patient ID: Sierra Ford, female    DOB: 10-18-1940  Age: 83 y.o. MRN: [id number]  Chief Complaint  Patient presents with   Annual Exam    Pt states fasting     HPI Discussed the use of AI scribe software for clinical note transcription with the patient, who gave verbal consent to proceed.  History of Present Illness Sierra Ford is an 83 year old female who presents for a follow-up on her hand pain and physical therapy for her legs.  Her hand pain is no longer hurting after a cortisone injection, and she is going back to physical therapy. Prior imaging included an x-ray and MRI.  She is currently attending physical therapy at [facility] for her leg discomfort, which she was previously unable to attend due to other medical issues.  She notes a change in bowel movements, describing them as 'coming out in a ball,' without associated pain or straining.  She underwent surgery for a meningioma on March 25th at [facility], with successful treatment of the tumor.  She has a history of carpal tunnel surgery and continues to experience issues with her legs and back.  She recently acquired new glasses and is adjusting to them, noting they are heavier than her previous pair.  She has not had a recent bone density test but anticipates needing one this year.  Her blood sugar levels have been stable. No significant changes in family history.   Patient Active Problem List   Diagnosis Date Noted   Diabetes mellitus treated with injections of non-insulin  medication (HCC) 03/06/2023   Lumbar stenosis 07/24/2022   Other spondylosis with radiculopathy, lumbar region 07/24/2022   Chronic pain of both shoulders 05/24/2022   Pain of lower extremity 05/02/2022   Uncontrolled type 2 diabetes mellitus with hyperglycemia (HCC) 03/09/2022   Acute left-sided low back pain with sciatica 03/09/2022   Former smoker 03/09/2022    Primary osteoarthritis of left hip 02/20/2022   Spinal stenosis of lumbar region 02/20/2022   Rotator cuff arthropathy of both shoulders 02/20/2022   Lower abdominal pain 04/04/2021   Rectal bleeding 04/04/2021   Change in bowel habits 04/04/2021   Internal hemorrhoids 04/04/2021   Preventative health care 03/07/2021   Sacroiliitis (HCC) 03/07/2021   Morbid (severe) obesity due to excess calories (HCC) 03/07/2021   Bronchitis 12/20/2020   Other pulmonary embolism with acute cor pulmonale, unspecified chronicity (HCC)    Educated about COVID-19 virus infection 01/26/2019   Hyperlipidemia associated with type 2 diabetes mellitus (HCC) 04/24/2018   Right ankle swelling 05/13/2017   Diabetes mellitus, type II (HCC) 04/08/2017   Primary hypertension 04/08/2017   Atherosclerosis of aorta (HCC) 04/08/2017   Abnormal auditory perception of both ears 10/01/2016   Bilateral impacted cerumen 10/01/2016   OSA (obstructive sleep apnea) 12/16/2015   DOE (dyspnea on exertion) 06/12/2013   Obesity (BMI 30-39.9) 04/24/2013   Chest pain, unspecified 01/22/2011   Abnormal stress test 01/22/2011   Frequent urination 01/22/2011   Chronic diastolic heart failure (HCC) 05/15/2009   CAROTID ARTERY DISEASE 01/12/2009   LEG EDEMA, RIGHT 07/27/2008   HX, URINARY INFECTION 09/19/2006   Hyperlipidemia LDL goal <70 08/20/2006   PREGNANCY, ECTOPIC NEC W/INTRAUTERINE PRG 08/20/2006   FREQUENCY, URINARY 08/20/2006   Other specified abnormal findings of blood chemistry 08/20/2006   Personal history of venous thrombosis and embolism 08/20/2006   Past Medical History:  Diagnosis Date   Anxiety  Back pain    Chest pain    CLite with apical ischemia in 2006 - normal coronary arteries by [facility] in 12/2004;  Myoview  11/12:  Low risk stress nuclear study with a small, partially reversible apical defect most likely related to apical thinning; cannot R/O very mild apical ischemia.  EF: 75%    Complication of  anesthesia    trouble waking after colonoscopy slept all day afterward into the next morning   Decreased hearing    Depression    Diabetes (HCC)    type II   DVT (deep venous thrombosis) (HCC) 2006   hx of and again in 2020   Ectopic pregnancy with intrauterine pregnancy    Hiatal hernia    patient not aware of hernia   Hyperlipidemia    PE (pulmonary embolism) 2006   hx of and again in 2020   Sleep apnea    CPAP    Past Surgical History:  Procedure Laterality Date   ABDOMINAL EXPLORATION SURGERY     ANTERIOR LAT LUMBAR FUSION N/A 07/24/2022   Procedure: Direct Lumbar Interbody Fusion PRONE TRANSPSOAS Lumbar three-four, Lumbar four-five;  Surgeon: Debby Dorn MATSU, MD;  Location: [facility] OR;  Service: Neurosurgery;  Laterality: N/A;   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/2023   [facility]   BUNIONECTOMY Right 1985   right   CARPAL TUNNEL RELEASE Right 02/22/2023   [facility]   COLONOSCOPY  2009   every 5 years   ECTOPIC PREGNANCY SURGERY     FOOT SURGERY Right 1985   LUMBAR PERCUTANEOUS PEDICLE SCREW 2 LEVEL N/A 07/24/2022   Procedure: LUMBAR PERCUTANEOUS PEDICLE SCREW LUMBAR THREE-FOUR, LUMBAR FOUR-FIVE;  Surgeon: Debby Dorn MATSU, MD;  Location: [facility] OR;  Service: Neurosurgery;  Laterality: N/A;   TUBAL LIGATION     Social History   Tobacco Use   Smoking status: Former    Current packs/day: 0.00    Average packs/day: 1 pack/day for 50.0 years (50.0 ttl pk-yrs)    Types: Cigarettes    Start date: 11/22/1959    Quit date: 11/21/2009    Years since quitting: 13.7   Smokeless tobacco: Never  Vaping Use   Vaping status: Never Used  Substance Use Topics   Alcohol use: Yes    Comment: rare 3 times a year per pt   Drug use: No   Social History   Socioeconomic History   Marital status: Divorced    Spouse name: Not on file   Number of children: 1   Years of education: Not on file   Highest education level: Not on file  Occupational History   Occupation: Retired Research scientist (physical sciences)   Tobacco Use   Smoking status: Former    Current packs/day: 0.00    Average packs/day: 1 pack/day for 50.0 years (50.0 ttl pk-yrs)    Types: Cigarettes    Start date: 11/22/1959    Quit date: 11/21/2009    Years since quitting: 13.7   Smokeless tobacco: Never  Vaping Use   Vaping status: Never Used  Substance and Sexual Activity   Alcohol use: Yes    Comment: rare 3 times a year per pt   Drug use: No   Sexual activity: Not Currently    Partners: Male  Other Topics Concern   Not on file  Social History Narrative   Regular exercise--- no   Social Drivers of Health   Financial Resource Strain: Low Risk  (08/27/2022)   Overall Financial Resource Strain (CARDIA)    Difficulty  of Paying Living Expenses: Not hard at all  Food Insecurity: No Food Insecurity (07/24/2022)   Hunger Vital Sign    Worried About Running Out of Food in the Last Year: Never true    Ran Out of Food in the Last Year: Never true  Transportation Needs: No Transportation Needs (07/24/2022)   PRAPARE - Administrator, Civil Service (Medical): No    Lack of Transportation (Non-Medical): No  Physical Activity: Inactive (08/27/2022)   Exercise Vital Sign    Days of Exercise per Week: 0 days    Minutes of Exercise per Session: 0 min  Stress: No Stress Concern Present (08/15/2020)   [employer] of Occupational Health - Occupational Stress Questionnaire    Feeling of Stress : Only a little  Social Connections: Moderately Integrated (08/15/2020)   Social Connection and Isolation Panel    Frequency of Communication with Friends and Family: More than three times a week    Frequency of Social Gatherings with Friends and Family: More than three times a week    Attends Religious Services: More than 4 times per year    Active Member of [employer] or Organizations: Yes    Attends Banker Meetings: 1 to 4 times per year    Marital Status: Widowed  Intimate Partner Violence: Not At Risk (07/24/2022)    Humiliation, Afraid, Rape, and Kick questionnaire    Fear of Current or Ex-Partner: No    Emotionally Abused: No    Physically Abused: No    Sexually Abused: No   Family Status  Relation Name Status   Mother  Deceased at age 30       heart failure   Father  Deceased       unknown causes   Mat Aunt  (Not Specified)   Mat Aunt  (Not Specified)   MGM  Deceased   MGF  Deceased   PGM  Deceased   PGF  Deceased   Neg Hx  (Not Specified)  No partnership data on file   Family History  Problem Relation Age of Onset   Heart failure Mother        died from   Colon polyps Mother    Hypertension Mother    Sudden death Mother    Obesity Mother    Alcoholism Father    Diabetes Maternal Aunt        grandaughter   Diabetes Maternal Aunt    Colon cancer Neg Hx    Esophageal cancer Neg Hx    Stomach cancer Neg Hx    No Known Allergies    Review of Systems  Constitutional:  Negative for fever and malaise/fatigue.  HENT:  Negative for congestion.   Eyes:  Negative for blurred vision.  Respiratory:  Negative for shortness of breath.   Cardiovascular:  Negative for chest pain, palpitations and leg swelling.  Gastrointestinal:  Negative for abdominal pain, blood in stool and nausea.  Genitourinary:  Negative for dysuria and frequency.  Musculoskeletal:  Negative for falls.  Skin:  Negative for rash.  Neurological:  Negative for dizziness, loss of consciousness and headaches.  Endo/Heme/Allergies:  Negative for environmental allergies.  Psychiatric/Behavioral:  Negative for depression. The patient is not nervous/anxious.       Objective:     BP 134/80 (BP Location: Left Arm, Patient Position: Sitting, Cuff Size: Large)   Pulse 82   Temp 98.2 F (36.8 C) (Oral)   Resp 18   Ht 5' 2 (1.575  m)   Wt 179 lb 9.6 oz (81.5 kg)   SpO2 96%   BMI 32.85 kg/m  BP Readings from Last 3 Encounters:  08/29/23 134/80  07/02/23 (!) 140/80  06/20/23 132/80   Wt Readings from Last 3  Encounters:  08/29/23 179 lb 9.6 oz (81.5 kg)  07/02/23 179 lb 9.6 oz (81.5 kg)  06/20/23 177 lb 9.6 oz (80.6 kg)   SpO2 Readings from Last 3 Encounters:  08/29/23 96%  07/02/23 98%  06/20/23 94%      Physical Exam Vitals and nursing note reviewed.  Constitutional:      General: She is not in acute distress.    Appearance: Normal appearance. She is well-developed.  HENT:     Head: Normocephalic and atraumatic.     Right Ear: Tympanic membrane, ear canal and external ear normal. There is no impacted cerumen.     Left Ear: Tympanic membrane, ear canal and external ear normal. There is no impacted cerumen.     Nose: Nose normal.     Mouth/Throat:     Mouth: Mucous membranes are moist.     Pharynx: Oropharynx is clear. No oropharyngeal exudate or posterior oropharyngeal erythema.  Eyes:     General: No scleral icterus.       Right eye: No discharge.        Left eye: No discharge.     Conjunctiva/sclera: Conjunctivae normal.     Pupils: Pupils are equal, round, and reactive to light.  Neck:     Thyroid : No thyromegaly or thyroid  tenderness.     Vascular: No JVD.  Cardiovascular:     Rate and Rhythm: Normal rate and regular rhythm.     Heart sounds: Normal heart sounds. No murmur heard. Pulmonary:     Effort: Pulmonary effort is normal. No respiratory distress.     Breath sounds: Normal breath sounds.  Abdominal:     General: Bowel sounds are normal. There is no distension.     Palpations: Abdomen is soft. There is no mass.     Tenderness: There is no abdominal tenderness. There is no guarding or rebound.  Musculoskeletal:        General: Normal range of motion.     Cervical back: Normal range of motion and neck supple.     Right lower leg: No edema.     Left lower leg: No edema.  Lymphadenopathy:     Cervical: No cervical adenopathy.  Skin:    General: Skin is warm and dry.     Findings: No erythema or rash.  Neurological:     Mental Status: She is alert and oriented  to person, place, and time.     Cranial Nerves: No cranial nerve deficit.     Deep Tendon Reflexes: Reflexes are normal and symmetric.  Psychiatric:        Mood and Affect: Mood normal.        Behavior: Behavior normal.        Thought Content: Thought content normal.        Judgment: Judgment normal.      No results found for any visits on 08/29/23.  Last CBC Lab Results  Component Value Date   WBC 4.6 07/17/2022   HGB 14.3 07/17/2022   HCT 44.1 07/17/2022   MCV 95.7 07/17/2022   MCH 31.0 07/17/2022   RDW 13.2 07/17/2022   PLT 258 07/17/2022   Last metabolic panel Lab Results  Component Value Date   GLUCOSE 102 (  H) 05/07/2023   NA 141 05/07/2023   K 4.5 05/07/2023   CL 105 05/07/2023   CO2 27 05/07/2023   BUN 10 05/07/2023   CREATININE 0.62 05/07/2023   GFR 83.39 05/07/2023   CALCIUM  10.2 05/07/2023   PROT 7.0 05/07/2023   ALBUMIN 4.5 05/07/2023   LABGLOB 2.4 01/28/2018   AGRATIO 2.1 01/28/2018   BILITOT 1.0 05/07/2023   ALKPHOS 134 (H) 05/07/2023   AST 19 05/07/2023   ALT 19 05/07/2023   ANIONGAP 9 07/17/2022   Last lipids Lab Results  Component Value Date   CHOL 227 (H) 05/07/2023   HDL 144.70 05/07/2023   LDLCALC 69 05/07/2023   LDLDIRECT 95.9 10/06/2012   TRIG 66.0 05/07/2023   CHOLHDL 2 05/07/2023   Last hemoglobin A1c Lab Results  Component Value Date   HGBA1C 6.2 05/07/2023   Last thyroid  functions Lab Results  Component Value Date   TSH 1.250 05/27/2017   T3TOTAL 81 05/27/2017   Last vitamin D  Lab Results  Component Value Date   VD25OH 39.1 01/28/2018   Last vitamin B12 and Folate No results found for: VITAMINB12, FOLATE    The ASCVD Risk score (Arnett DK, et al., 2019) failed to calculate for the following reasons:   The 2019 ASCVD risk score is only valid for ages 25 to 6    Assessment & Plan:   Problem List Items Addressed This Visit       Unprioritized   Hyperlipidemia associated with type 2 diabetes mellitus  (HCC)   Relevant Orders   Comprehensive metabolic panel with GFR   Lipid panel   Diabetes mellitus treated with injections of non-insulin  medication (HCC)   Relevant Orders   CBC with Differential/Platelet   Comprehensive metabolic panel with GFR   Lipid panel   TSH   Hemoglobin A1c   Microalbumin / creatinine urine ratio   Uncontrolled type 2 diabetes mellitus with hyperglycemia (HCC)   hgba1c to be checked , minimize simple carbs. Increase exercise as tolerated. Continue current meds       Primary hypertension   Well controlled, no changes to meds. Encouraged heart healthy diet such as the DASH diet and exercise as tolerated.        Relevant Medications   potassium chloride  SA (KLOR-CON  M) 20 MEQ tablet   Other Relevant Orders   CBC with Differential/Platelet   Comprehensive metabolic panel with GFR   Lipid panel   TSH   Preventative health care - Primary   Ghm utd Check labs  See AVS Health Maintenance  Topic Date Due   DEXA SCAN  07/14/2021   OPHTHALMOLOGY EXAM  10/20/2021   Medicare Annual Wellness (AWV)  08/27/2023   COVID-19 Vaccine (8 - 2024-25 season) 08/07/2024 (Originally 08/02/2023)   INFLUENZA VACCINE  09/20/2023   HEMOGLOBIN A1C  11/07/2023   Diabetic kidney evaluation - eGFR measurement  05/06/2024   Diabetic kidney evaluation - Urine ACR  05/06/2024   FOOT EXAM  05/06/2024   DTaP/Tdap/Td (4 - Td or Tdap) 11/05/2028   Pneumococcal Vaccine: 50+ Years  Completed   Zoster Vaccines- Shingrix   Completed   Hepatitis B Vaccines  Aged Out   HPV VACCINES  Aged Out   Meningococcal B Vaccine  Aged Out   Lung Cancer Screening  Discontinued   Hepatitis C Screening  Discontinued         Assessment and Plan Assessment & Plan Hand pain   Hand pain was evaluated by a specialist. X-ray and MRI  were performed, followed by a cortisone injection, which alleviated the pain. She can now return to physical therapy. Continue physical therapy for hand  rehabilitation.  Leg and back pain   Chronic leg and back pain persist without improvement. She is attending physical therapy for her legs. Continue physical therapy for leg rehabilitation.  Meningioma (post-surgical)   Surgically treated meningioma on March 25th at [facility] with successful tumor removal.  Carpal tunnel syndrome (post-surgical)   Post-surgical status for carpal tunnel syndrome with no current issues reported.  Bowel movement changes   Reports bowel movements that are formed in a ball shape without straining or pain, not causing discomfort.  General Health Maintenance   Eye examination completed this year with new glasses. Due for a bone density scan, typically coordinated with gynecological visits. Mammogram scheduled around the same time. Check for bone density scan during next gynecological visit. Coordinate mammogram with gynecological visit.  Follow-up   Generally in good health, turning 83 at the end of the month. Scheduled to attend a high school class reunion and declined a family reunion due to mobility concerns. Check urine for protein. Schedule lab work.   No follow-ups on file.    Jaleiah Asay R Lowne Chase, DO

## 2023-08-29 NOTE — Assessment & Plan Note (Signed)
 hgba1c to be checked, minimize simple carbs. Increase exercise as tolerated. Continue current meds

## 2023-09-02 DIAGNOSIS — M48061 Spinal stenosis, lumbar region without neurogenic claudication: Secondary | ICD-10-CM | POA: Diagnosis not present

## 2023-09-02 DIAGNOSIS — M545 Low back pain, unspecified: Secondary | ICD-10-CM | POA: Diagnosis not present

## 2023-09-04 DIAGNOSIS — M545 Low back pain, unspecified: Secondary | ICD-10-CM | POA: Diagnosis not present

## 2023-09-04 DIAGNOSIS — M48061 Spinal stenosis, lumbar region without neurogenic claudication: Secondary | ICD-10-CM | POA: Diagnosis not present

## 2023-09-06 DIAGNOSIS — M545 Low back pain, unspecified: Secondary | ICD-10-CM | POA: Diagnosis not present

## 2023-09-06 DIAGNOSIS — M48061 Spinal stenosis, lumbar region without neurogenic claudication: Secondary | ICD-10-CM | POA: Diagnosis not present

## 2023-09-09 DIAGNOSIS — M48061 Spinal stenosis, lumbar region without neurogenic claudication: Secondary | ICD-10-CM | POA: Diagnosis not present

## 2023-09-09 DIAGNOSIS — M545 Low back pain, unspecified: Secondary | ICD-10-CM | POA: Diagnosis not present

## 2023-09-11 ENCOUNTER — Ambulatory Visit (INDEPENDENT_AMBULATORY_CARE_PROVIDER_SITE_OTHER)

## 2023-09-11 VITALS — BP 134/80 | Ht 62.0 in | Wt 175.0 lb

## 2023-09-11 DIAGNOSIS — M48061 Spinal stenosis, lumbar region without neurogenic claudication: Secondary | ICD-10-CM | POA: Diagnosis not present

## 2023-09-11 DIAGNOSIS — M545 Low back pain, unspecified: Secondary | ICD-10-CM | POA: Diagnosis not present

## 2023-09-11 DIAGNOSIS — Z0000 Encounter for general adult medical examination without abnormal findings: Secondary | ICD-10-CM

## 2023-09-11 NOTE — Patient Instructions (Signed)
 Sierra Ford , Thank you for taking time out of your busy schedule to complete your Annual Wellness Visit with me. I enjoyed our conversation and look forward to speaking with you again next year. I, as well as your care team,  appreciate your ongoing commitment to your health goals. Please review the following plan we discussed and let me know if I can assist you in the future. Your Game plan/ To Do List    Referrals: If you haven't heard from the office you've been referred to, please reach out to them at the phone provided.  none Follow up Visits: Next Medicare AWV with our clinical staff: 09/16/2024   Have you seen your provider in the last 6 months (3 months if uncontrolled diabetes)? Yes Next Office Visit with your provider: 03/03/2024  Clinician Recommendations:  Aim for 30 minutes of exercise or brisk walking, 6-8 glasses of water, and 5 servings of fruits and vegetables each day.       This is a list of the screening recommended for you and due dates:  Health Maintenance  Topic Date Due   DEXA scan (bone density measurement)  07/14/2021   Eye exam for diabetics  10/20/2021   COVID-19 Vaccine (8 - 2024-25 season) 08/07/2024*   Flu Shot  09/20/2023   Hemoglobin A1C  02/29/2024   Complete foot exam   05/06/2024   Yearly kidney function blood test for diabetes  08/28/2024   Yearly kidney health urinalysis for diabetes  08/28/2024   Medicare Annual Wellness Visit  09/10/2024   DTaP/Tdap/Td vaccine (4 - Td or Tdap) 11/05/2028   Pneumococcal Vaccine for age over 31  Completed   Zoster (Shingles) Vaccine  Completed   Hepatitis B Vaccine  Aged Out   HPV Vaccine  Aged Out   Meningitis B Vaccine  Aged Out   Screening for Lung Cancer  Discontinued   Hepatitis C Screening  Discontinued  *Topic was postponed. The date shown is not the original due date.    Advanced directives: (Declined) Advance directive discussed with you today. Even though you declined this today, please call our  office should you change your mind, and we can give you the proper paperwork for you to fill out. Advance Care Planning is important because it:  [x]  Makes sure you receive the medical care that is consistent with your values, goals, and preferences  [x]  It provides guidance to your family and loved ones and reduces their decisional burden about whether or not they are making the right decisions based on your wishes.  Follow the link provided in your after visit summary or read over the paperwork we have mailed to you to help you started getting your Advance Directives in place. If you need assistance in completing these, please reach out to us  so that we can help you!  See attachments for Preventive Care and Fall Prevention Tips.

## 2023-09-11 NOTE — Progress Notes (Signed)
 Because this visit was a virtual/telehealth visit,  certain criteria was not obtained, such a blood pressure, CBG if applicable, and timed get up and go. Any medications not marked as taking were not mentioned during the medication reconciliation part of the visit. Any vitals not documented were not able to be obtained due to this being a telehealth visit or patient was unable to self-report a recent blood pressure reading due to a lack of equipment at home via telehealth. Vitals that have been documented are verbally provided by the patient.  This visit was performed by a medical professional under my direct supervision. I was immediately available for consultation/collaboration. I have reviewed and agree with the Annual Wellness Visit documentation.  Subjective:   Sierra Ford is a 83 y.o. who presents for a Medicare Wellness preventive visit.  As a reminder, Annual Wellness Visits don't include a physical exam, and some assessments may be limited, especially if this visit is performed virtually. We may recommend an in-person follow-up visit with your provider if needed.  Visit Complete: Virtual I connected with  Sierra Ford on 09/11/23 by a audio enabled telemedicine application and verified that I am speaking with the correct person using two identifiers.  Patient Location: Home  Provider Location: Home Office  I discussed the limitations of evaluation and management by telemedicine. The patient expressed understanding and agreed to proceed.  Vital Signs: Because this visit was a virtual/telehealth visit, some criteria may be missing or patient reported. Any vitals not documented were not able to be obtained and vitals that have been documented are patient reported.  VideoDeclined- This patient declined Librarian, academic. Therefore the visit was completed with audio only.  Persons Participating in Visit: Patient.  AWV Questionnaire: No: Patient  Medicare AWV questionnaire was not completed prior to this visit.  Cardiac Risk Factors include: advanced age (>48men, >24 women);diabetes mellitus;obesity (BMI >30kg/m2);dyslipidemia;hypertension     Objective:    Today's Vitals   09/11/23 1101 09/11/23 1102  BP: 134/80   Weight: 175 lb (79.4 kg)   Height: 5' 2 (1.575 m)   PainSc:  6    Body mass index is 32.01 kg/m.     09/11/2023   11:00 AM 08/27/2022   11:17 AM 07/24/2022    8:00 PM 07/24/2022    7:56 AM 07/17/2022   11:27 AM 02/07/2022    9:08 AM 08/24/2021    1:04 PM  Advanced Directives  Does Patient Have a Medical Advance Directive? Yes No No Yes Yes No Yes  Type of Estate agent of [place];Living will   Healthcare Power of [place];Living will Healthcare Power of [place];Living will  Healthcare Power of [place];Out of facility DNR (pink MOST or yellow form);Living will  Does patient want to make changes to medical advance directive? No - Patient declined  No - Patient declined No - Patient declined No - Patient declined  No - Patient declined  Copy of Healthcare Power of Attorney in Chart? No - copy requested   No - copy requested   No - copy requested  Would patient like information on creating a medical advance directive?  No - Patient declined No - Patient declined        Current Medications (verified) Outpatient Encounter Medications as of 09/11/2023  Medication Sig   apixaban  (ELIQUIS ) 2.5 MG TABS tablet Take 1 tablet (2.5 mg total) by mouth 2 (two) times daily.   blood glucose meter kit and supplies KIT Dispense based  on patient and insurance preference. Use up to four times daily as directed. (FOR ICD-9 250.00, 250.01).   Calcium  Carb-Cholecalciferol (CALCIUM  600 + D PO) Take 1 tablet by mouth daily.   cholecalciferol (VITAMIN D3) 25 MCG (1000 UNIT) tablet Take 1,000 Units by mouth 2 (two) times daily.   Coenzyme Q10 100 MG TABS Take 100 mg by mouth daily.   diphenhydrAMINE HCl, Sleep, (ZZZQUIL)  25 MG CAPS Take 25 mg by mouth at bedtime as needed (sleep).   Evolocumab  (REPATHA  SURECLICK) 140 MG/ML SOAJ Inject 140 mg into the skin every 14 (fourteen) days.   Flaxseed, Linseed, (FLAXSEED OIL) 1200 MG CAPS Take 1,200 mg by mouth daily.   furosemide  (LASIX ) 40 MG tablet TAKE 1 TABLET DAILY (Patient taking differently: Take 40 mg by mouth daily as needed for fluid.)   gabapentin  (NEURONTIN ) 300 MG capsule Take 300 mg by mouth at bedtime as needed (pain/sleep).   Insulin  Pen Needle (NOVOFINE PLUS PEN NEEDLE) 32G X 4 MM MISC As directed   Multiple Vitamin (MULTIVITAMIN) capsule Take 1 capsule by mouth daily with breakfast.   ofloxacin  (FLOXIN ) 0.3 % OTIC solution Place 10 drops into the right ear daily.   Omega-3 Fatty Acids (FISH OIL) 1000 MG CAPS Take 1,000 mg by mouth daily.   oxyCODONE  (OXY IR/ROXICODONE ) 5 MG immediate release tablet Take 1 tablet (5 mg total) by mouth every 6 (six) hours as needed for moderate pain ((score 4 to 6)).   potassium chloride  SA (KLOR-CON  M) 20 MEQ tablet Take 1 tablet (20 mEq total) by mouth daily.   rosuvastatin  (CRESTOR ) 10 MG tablet Take 1 tablet (10 mg total) by mouth daily.   Semaglutide , 1 MG/DOSE, 4 MG/3ML SOPN Inject 1 mg as directed once a week.   TRUE METRIX BLOOD GLUCOSE TEST test strip TEST  UP  TO FOUR TIMES DAILY AS DIRECTED   TRUEPLUS LANCETS 33G MISC TEST  UP TO FOUR TIMES DAILY AS DIRECTED   [DISCONTINUED] omega-3 acid ethyl esters (LOVAZA) 1 G capsule Take 1 g by mouth 3 (three) times daily.     [DISCONTINUED] pravastatin  (PRAVACHOL ) 20 MG tablet Take 20 mg by mouth daily.     No facility-administered encounter medications on file as of 09/11/2023.    Allergies (verified) Patient has no known allergies.   History: Past Medical History:  Diagnosis Date   Anxiety    Back pain    Chest pain    CLite with apical ischemia in 2006 - normal coronary arteries by [facility] in 12/2004;  Myoview  11/12:  Low risk stress nuclear study with a small,  partially reversible apical defect most likely related to apical thinning; cannot R/O very mild apical ischemia.  EF: 75%    Complication of anesthesia    trouble waking after colonoscopy slept all day afterward into the next morning   Decreased hearing    Depression    Diabetes (HCC)    type II   DVT (deep venous thrombosis) (HCC) 2006   hx of and again in 2020   Ectopic pregnancy with intrauterine pregnancy    Hiatal hernia    patient not aware of hernia   Hyperlipidemia    PE (pulmonary embolism) 2006   hx of and again in 2020   Sleep apnea    CPAP    Past Surgical History:  Procedure Laterality Date   ABDOMINAL EXPLORATION SURGERY     ANTERIOR LAT LUMBAR FUSION N/A 07/24/2022   Procedure: Direct Lumbar Interbody Fusion PRONE  TRANSPSOAS Lumbar three-four, Lumbar four-five;  Surgeon: Debby Dorn MATSU, MD;  Location: [facility] OR;  Service: Neurosurgery;  Laterality: N/A;   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/2023   [facility]   BUNIONECTOMY Right 1985   right   CARPAL TUNNEL RELEASE Right 02/22/2023   [facility]   COLONOSCOPY  2009   every 5 years   ECTOPIC PREGNANCY SURGERY     FOOT SURGERY Right 1985   LUMBAR PERCUTANEOUS PEDICLE SCREW 2 LEVEL N/A 07/24/2022   Procedure: LUMBAR PERCUTANEOUS PEDICLE SCREW LUMBAR THREE-FOUR, LUMBAR FOUR-FIVE;  Surgeon: Debby Dorn MATSU, MD;  Location: [facility] OR;  Service: Neurosurgery;  Laterality: N/A;   TUBAL LIGATION     Family History  Problem Relation Age of Onset   Heart failure Mother        died from   Colon polyps Mother    Hypertension Mother    Sudden death Mother    Obesity Mother    Alcoholism Father    Diabetes Maternal Aunt        grandaughter   Diabetes Maternal Aunt    Colon cancer Neg Hx    Esophageal cancer Neg Hx    Stomach cancer Neg Hx    Social History   Socioeconomic History   Marital status: Divorced    Spouse name: Not on file   Number of children: 1   Years of education: Not on file   Highest education  level: Not on file  Occupational History   Occupation: Retired Research scientist (physical sciences)  Tobacco Use   Smoking status: Former    Current packs/day: 0.00    Average packs/day: 1 pack/day for 50.0 years (50.0 ttl pk-yrs)    Types: Cigarettes    Start date: 11/22/1959    Quit date: 11/21/2009    Years since quitting: 13.8   Smokeless tobacco: Never  Vaping Use   Vaping status: Never Used  Substance and Sexual Activity   Alcohol use: Yes    Comment: rare 3 times a year per pt   Drug use: No   Sexual activity: Not Currently    Partners: Male  Other Topics Concern   Not on file  Social History Narrative   Regular exercise--- no   Social Drivers of Health   Financial Resource Strain: Low Risk  (08/27/2022)   Overall Financial Resource Strain (CARDIA)    Difficulty of Paying Living Expenses: Not hard at all  Food Insecurity: No Food Insecurity (07/24/2022)   Hunger Vital Sign    Worried About Running Out of Food in the Last Year: Never true    Ran Out of Food in the Last Year: Never true  Transportation Needs: No Transportation Needs (07/24/2022)   PRAPARE - Administrator, Civil Service (Medical): No    Lack of Transportation (Non-Medical): No  Physical Activity: Inactive (08/27/2022)   Exercise Vital Sign    Days of Exercise per Week: 0 days    Minutes of Exercise per Session: 0 min  Stress: No Stress Concern Present (08/15/2020)   [employer] of Occupational Health - Occupational Stress Questionnaire    Feeling of Stress : Only a little  Social Connections: Moderately Integrated (08/15/2020)   Social Connection and Isolation Panel    Frequency of Communication with Friends and Family: More than three times a week    Frequency of Social Gatherings with Friends and Family: More than three times a week    Attends Religious Services: More than 4 times per year  Active Member of Clubs or Organizations: Yes    Attends Banker Meetings: 1 to 4 times per year    Marital  Status: Widowed    Tobacco Counseling Counseling given: Not Answered    Clinical Intake:  Pre-visit preparation completed: Yes  Pain : 0-10 Pain Score: 6  Pain Type: Chronic pain Pain Location: Back Pain Orientation: Mid Pain Onset: Today Pain Frequency: Intermittent     BMI - recorded: 32.01 Nutritional Status: BMI > 30  Obese Nutritional Risks: None Diabetes: Yes CBG done?: No Did pt. bring in CBG monitor from home?: No  Lab Results  Component Value Date   HGBA1C 6.3 08/29/2023   HGBA1C 6.2 05/07/2023   HGBA1C 6.3 (H) 07/17/2022     How often do you need to have someone help you when you read instructions, pamphlets, or other written materials from your doctor or pharmacy?: 1 - Never  Interpreter Needed?: No  Information entered by :: Lyle Anderson LATHER   Activities of Daily Living     09/11/2023   11:05 AM  In your present state of health, do you have any difficulty performing the following activities:  Hearing? 0  Vision? 0  Difficulty concentrating or making decisions? 1  Walking or climbing stairs? 0  Dressing or bathing? 0  Doing errands, shopping? 0  Preparing Food and eating ? N  Using the Toilet? N  In the past six months, have you accidently leaked urine? Y  Do you have problems with loss of bowel control? Y  Managing your Medications? N  Managing your Finances? N  Housekeeping or managing your Housekeeping? N    Patient Care Team: Antonio Meth, Jamee SAUNDERS, DO as PCP - General Lavona Agent, MD as PCP - Cardiology (Cardiology) Rosalynn LELON Ingle, MD (Inactive) as Consulting Physician (Obstetrics and Gynecology) Carla Milling, RPH-CPP (Pharmacist) Jude Harden GAILS, MD as Consulting Physician (Pulmonary Disease) Glendia Bare, OD (Optometry)  I have updated your Care Teams any recent Medical Services you may have received from other providers in the past year.     Assessment:   This is a routine wellness examination for  Sierra Ford.  Hearing/Vision screen Hearing Screening - Comments:: No difficulties Vision Screening - Comments:: Patient wears glasses   Goals Addressed             This Visit's Progress    Patient Stated   On track    Eat healthier & lose some weight       Depression Screen     09/11/2023   11:07 AM 08/27/2022   11:16 AM 03/09/2022   10:33 AM 08/24/2021    1:05 PM 08/15/2020   11:51 AM 08/11/2020   10:28 AM 05/10/2020   10:31 AM  PHQ 2/9 Scores  PHQ - 2 Score 2 0 0 1 0 0 0  PHQ- 9 Score 2          Fall Risk     09/11/2023   11:04 AM 03/08/2023   11:13 AM 08/27/2022   11:08 AM 03/09/2022   10:31 AM 08/24/2021    1:05 PM  Fall Risk   Falls in the past year? 0 0 0 0 0  Number falls in past yr: 0  0 0 0  Injury with Fall? 0  0 0 0  Risk for fall due to : No Fall Risks  Impaired mobility  No Fall Risks  Risk for fall due to: Comment   recent back surgery    Follow  up Falls evaluation completed  Falls evaluation completed Falls evaluation completed  Falls evaluation completed      Data saved with a previous flowsheet row definition    MEDICARE RISK AT HOME:  Medicare Risk at Home Any stairs in or around the home?: No If so, are there any without handrails?: No Home free of loose throw rugs in walkways, pet beds, electrical cords, etc?: Yes Adequate lighting in your home to reduce risk of falls?: Yes Life alert?: No Use of a cane, walker or w/c?: No Grab bars in the bathroom?: Yes Shower chair or bench in shower?: Yes Elevated toilet seat or a handicapped toilet?: Yes  TIMED UP AND GO:  Was the test performed?  No  Cognitive Function: 6CIT completed    10/09/2016    4:07 PM 07/15/2015   11:42 AM  MMSE - Mini Mental State Exam  Orientation to time 5  5   Orientation to Place 5  5   Registration 3  3   Attention/ Calculation 5  5   Recall 3  3   Language- name 2 objects 2  2   Language- repeat 1 1  Language- follow 3 step command 3  3   Language- read & follow  direction 1  1   Write a sentence 1  1   Copy design 1  1   Total score 30  30      Data saved with a previous flowsheet row definition        08/27/2022   11:19 AM 08/24/2021    1:15 PM  6CIT Screen  What Year? 0 points 0 points  What month? 0 points 0 points  What time? 0 points 0 points  Count back from 20 0 points 0 points  Months in reverse 0 points 0 points  Repeat phrase 0 points 0 points  Total Score 0 points 0 points    Immunizations Immunization History  Administered Date(s) Administered   Fluad  Quad(high Dose 65+) 12/04/2019, 11/03/2020, 12/29/2020, 12/07/2021   Fluad  Trivalent(High Dose 65+) 10/19/2022   Influenza, High Dose Seasonal PF 12/22/2018   Influenza-Unspecified 12/04/2018   PFIZER Comirnaty (Gray Top)Covid-19 Tri-Sucrose Vaccine 06/08/2020   PFIZER(Purple Top)SARS-COV-2 Vaccination 03/06/2019, 03/27/2019, 11/10/2019   Pfizer Covid-19 Vaccine Bivalent Booster 36yrs & up 12/06/2020   Pfizer(Comirnaty )Fall Seasonal Vaccine 12 years and older 12/07/2021, 02/01/2023   Pneumococcal Conjugate-13 04/24/2013   Pneumococcal Polysaccharide-23 08/07/2007, 12/04/2019   Td 03/03/2003   Td (Adult), 2 Lf Tetanus Toxid, Preservative Free 03/03/2003   Tdap 11/06/2018   Zoster Recombinant(Shingrix ) 11/06/2018, 05/11/2019   Zoster, Live 08/14/2007, 11/13/2007    Screening Tests Health Maintenance  Topic Date Due   DEXA SCAN  07/14/2021   OPHTHALMOLOGY EXAM  10/20/2021   COVID-19 Vaccine (8 - 2024-25 season) 08/07/2024 (Originally 08/02/2023)   INFLUENZA VACCINE  09/20/2023   HEMOGLOBIN A1C  02/29/2024   FOOT EXAM  05/06/2024   Diabetic kidney evaluation - eGFR measurement  08/28/2024   Diabetic kidney evaluation - Urine ACR  08/28/2024   Medicare Annual Wellness (AWV)  09/10/2024   DTaP/Tdap/Td (4 - Td or Tdap) 11/05/2028   Pneumococcal Vaccine: 50+ Years  Completed   Zoster Vaccines- Shingrix   Completed   Hepatitis B Vaccines  Aged Out   HPV VACCINES  Aged Out    Meningococcal B Vaccine  Aged Out   Lung Cancer Screening  Discontinued   Hepatitis C Screening  Discontinued    Health Maintenance  Health Maintenance Due  Topic  Date Due   DEXA SCAN  07/14/2021   OPHTHALMOLOGY EXAM  10/20/2021   Health Maintenance Items Addressed:  Additional Screening:  Vision Screening: Recommended annual ophthalmology exams for early detection of glaucoma and other disorders of the eye. Would you like a referral to an eye doctor? No    Dental Screening: Recommended annual dental exams for proper oral hygiene  Community Resource Referral / Chronic Care Management: CRR required this visit?  No   CCM required this visit?  No   Plan:    I have personally reviewed and noted the following in the patient's chart:   Medical and social history Use of alcohol, tobacco or illicit drugs  Current medications and supplements including opioid prescriptions. Patient is not currently taking opioid prescriptions. Functional ability and status Nutritional status Physical activity Advanced directives List of other physicians Hospitalizations, surgeries, and ER visits in previous 12 months Vitals Screenings to include cognitive, depression, and falls Referrals and appointments  In addition, I have reviewed and discussed with patient certain preventive protocols, quality metrics, and best practice recommendations. A written personalized care plan for preventive services as well as general preventive health recommendations were provided to patient.   Lyle MARLA Right, [place]   09/11/2023   After Visit Summary: (MyChart) Due to this being a telephonic visit, the after visit summary with patients personalized plan was offered to patient via MyChart   Notes: Nothing significant to report at this time.

## 2023-09-13 DIAGNOSIS — M48061 Spinal stenosis, lumbar region without neurogenic claudication: Secondary | ICD-10-CM | POA: Diagnosis not present

## 2023-09-13 DIAGNOSIS — M545 Low back pain, unspecified: Secondary | ICD-10-CM | POA: Diagnosis not present

## 2023-09-16 DIAGNOSIS — M545 Low back pain, unspecified: Secondary | ICD-10-CM | POA: Diagnosis not present

## 2023-09-16 DIAGNOSIS — M48061 Spinal stenosis, lumbar region without neurogenic claudication: Secondary | ICD-10-CM | POA: Diagnosis not present

## 2023-09-17 ENCOUNTER — Telehealth: Payer: Self-pay | Admitting: Pharmacist

## 2023-09-17 NOTE — Telephone Encounter (Signed)
 Patient called because she received a letter from [facility] that her authorization for Repatha  will expire in 60 days.  Patient called because she was worried about not being able to get Repatha  which she actually gets from [facility] and not [facility].  Called [facility] and they were able to fill Repatha  1 more time but she will need a new Rx for next refill - prescribed by her cardiologist, Dr Lindi.  [facility] was able to use her [employer] grant for Repatha  cost and it was $0.  Patient reminded to call for her August refill around 10/11/2023 so there will be time to request refill and get prior authorization if necessary.   Madelin Ray, PharmD Clinical Pharmacist [facility]  Population Health [phone]

## 2023-09-18 DIAGNOSIS — M545 Low back pain, unspecified: Secondary | ICD-10-CM | POA: Diagnosis not present

## 2023-09-18 DIAGNOSIS — M48061 Spinal stenosis, lumbar region without neurogenic claudication: Secondary | ICD-10-CM | POA: Diagnosis not present

## 2023-09-20 DIAGNOSIS — M48061 Spinal stenosis, lumbar region without neurogenic claudication: Secondary | ICD-10-CM | POA: Diagnosis not present

## 2023-09-20 DIAGNOSIS — M545 Low back pain, unspecified: Secondary | ICD-10-CM | POA: Diagnosis not present

## 2023-09-25 DIAGNOSIS — M48061 Spinal stenosis, lumbar region without neurogenic claudication: Secondary | ICD-10-CM | POA: Diagnosis not present

## 2023-09-25 DIAGNOSIS — M545 Low back pain, unspecified: Secondary | ICD-10-CM | POA: Diagnosis not present

## 2023-09-30 DIAGNOSIS — M48061 Spinal stenosis, lumbar region without neurogenic claudication: Secondary | ICD-10-CM | POA: Diagnosis not present

## 2023-09-30 DIAGNOSIS — M545 Low back pain, unspecified: Secondary | ICD-10-CM | POA: Diagnosis not present

## 2023-10-02 DIAGNOSIS — M545 Low back pain, unspecified: Secondary | ICD-10-CM | POA: Diagnosis not present

## 2023-10-02 DIAGNOSIS — M48061 Spinal stenosis, lumbar region without neurogenic claudication: Secondary | ICD-10-CM | POA: Diagnosis not present

## 2023-10-04 DIAGNOSIS — M48061 Spinal stenosis, lumbar region without neurogenic claudication: Secondary | ICD-10-CM | POA: Diagnosis not present

## 2023-10-04 DIAGNOSIS — M545 Low back pain, unspecified: Secondary | ICD-10-CM | POA: Diagnosis not present

## 2023-10-07 DIAGNOSIS — M48061 Spinal stenosis, lumbar region without neurogenic claudication: Secondary | ICD-10-CM | POA: Diagnosis not present

## 2023-10-07 DIAGNOSIS — M545 Low back pain, unspecified: Secondary | ICD-10-CM | POA: Diagnosis not present

## 2023-10-09 DIAGNOSIS — M545 Low back pain, unspecified: Secondary | ICD-10-CM | POA: Diagnosis not present

## 2023-10-09 DIAGNOSIS — M48061 Spinal stenosis, lumbar region without neurogenic claudication: Secondary | ICD-10-CM | POA: Diagnosis not present

## 2023-10-11 DIAGNOSIS — M545 Low back pain, unspecified: Secondary | ICD-10-CM | POA: Diagnosis not present

## 2023-10-11 DIAGNOSIS — M48061 Spinal stenosis, lumbar region without neurogenic claudication: Secondary | ICD-10-CM | POA: Diagnosis not present

## 2023-10-13 ENCOUNTER — Other Ambulatory Visit: Payer: Self-pay | Admitting: Cardiology

## 2023-10-14 DIAGNOSIS — M545 Low back pain, unspecified: Secondary | ICD-10-CM | POA: Diagnosis not present

## 2023-10-14 DIAGNOSIS — M48061 Spinal stenosis, lumbar region without neurogenic claudication: Secondary | ICD-10-CM | POA: Diagnosis not present

## 2023-10-16 DIAGNOSIS — M48061 Spinal stenosis, lumbar region without neurogenic claudication: Secondary | ICD-10-CM | POA: Diagnosis not present

## 2023-10-16 DIAGNOSIS — M545 Low back pain, unspecified: Secondary | ICD-10-CM | POA: Diagnosis not present

## 2023-10-18 DIAGNOSIS — M545 Low back pain, unspecified: Secondary | ICD-10-CM | POA: Diagnosis not present

## 2023-10-18 DIAGNOSIS — M48061 Spinal stenosis, lumbar region without neurogenic claudication: Secondary | ICD-10-CM | POA: Diagnosis not present

## 2023-10-23 DIAGNOSIS — M545 Low back pain, unspecified: Secondary | ICD-10-CM | POA: Diagnosis not present

## 2023-10-23 DIAGNOSIS — M48061 Spinal stenosis, lumbar region without neurogenic claudication: Secondary | ICD-10-CM | POA: Diagnosis not present

## 2023-10-28 DIAGNOSIS — M48061 Spinal stenosis, lumbar region without neurogenic claudication: Secondary | ICD-10-CM | POA: Diagnosis not present

## 2023-10-28 DIAGNOSIS — M545 Low back pain, unspecified: Secondary | ICD-10-CM | POA: Diagnosis not present

## 2023-10-29 ENCOUNTER — Telehealth: Payer: Self-pay | Admitting: Pharmacist

## 2023-10-29 NOTE — Telephone Encounter (Signed)
 Patient called Clinical Pharmacist Practitioner with question regarding her Repatha . She picked up from [facility] today and she got 3 boxes and she usually only gets 1 box or 30 days. She wanted to verify this was correct.  It looks like Dr Lavona did write most recent Rx for 3 month supply at a time. Patient's cost with [employer] [place] was $0.  She will be due to renew grant in November / December 2025.    Has 2 pens of Ozempic  remaining = 8 doses. Called [facility] to see when next refill request if available. Reported that we can request refill now. Since there have been shipping delays recently will go ahead and start paperwork to request next refill for patient.   Madelin Ray, PharmD Clinical Pharmacist [facility]

## 2023-11-04 DIAGNOSIS — M48061 Spinal stenosis, lumbar region without neurogenic claudication: Secondary | ICD-10-CM | POA: Diagnosis not present

## 2023-11-04 DIAGNOSIS — M545 Low back pain, unspecified: Secondary | ICD-10-CM | POA: Diagnosis not present

## 2023-11-06 DIAGNOSIS — M48061 Spinal stenosis, lumbar region without neurogenic claudication: Secondary | ICD-10-CM | POA: Diagnosis not present

## 2023-11-06 DIAGNOSIS — M545 Low back pain, unspecified: Secondary | ICD-10-CM | POA: Diagnosis not present

## 2023-11-08 DIAGNOSIS — M48061 Spinal stenosis, lumbar region without neurogenic claudication: Secondary | ICD-10-CM | POA: Diagnosis not present

## 2023-11-08 DIAGNOSIS — M25552 Pain in left hip: Secondary | ICD-10-CM | POA: Diagnosis not present

## 2023-11-08 DIAGNOSIS — Z6831 Body mass index (BMI) 31.0-31.9, adult: Secondary | ICD-10-CM | POA: Diagnosis not present

## 2023-11-08 DIAGNOSIS — M4316 Spondylolisthesis, lumbar region: Secondary | ICD-10-CM | POA: Diagnosis not present

## 2023-11-08 DIAGNOSIS — M545 Low back pain, unspecified: Secondary | ICD-10-CM | POA: Diagnosis not present

## 2023-11-11 DIAGNOSIS — M48061 Spinal stenosis, lumbar region without neurogenic claudication: Secondary | ICD-10-CM | POA: Diagnosis not present

## 2023-11-11 DIAGNOSIS — M545 Low back pain, unspecified: Secondary | ICD-10-CM | POA: Diagnosis not present

## 2023-11-13 DIAGNOSIS — M48061 Spinal stenosis, lumbar region without neurogenic claudication: Secondary | ICD-10-CM | POA: Diagnosis not present

## 2023-11-13 DIAGNOSIS — M545 Low back pain, unspecified: Secondary | ICD-10-CM | POA: Diagnosis not present

## 2023-11-18 DIAGNOSIS — M545 Low back pain, unspecified: Secondary | ICD-10-CM | POA: Diagnosis not present

## 2023-11-18 DIAGNOSIS — M48061 Spinal stenosis, lumbar region without neurogenic claudication: Secondary | ICD-10-CM | POA: Diagnosis not present

## 2023-11-20 DIAGNOSIS — M545 Low back pain, unspecified: Secondary | ICD-10-CM | POA: Diagnosis not present

## 2023-11-20 DIAGNOSIS — M48061 Spinal stenosis, lumbar region without neurogenic claudication: Secondary | ICD-10-CM | POA: Diagnosis not present

## 2023-11-22 DIAGNOSIS — M545 Low back pain, unspecified: Secondary | ICD-10-CM | POA: Diagnosis not present

## 2023-11-22 DIAGNOSIS — M48061 Spinal stenosis, lumbar region without neurogenic claudication: Secondary | ICD-10-CM | POA: Diagnosis not present

## 2023-11-25 DIAGNOSIS — M48061 Spinal stenosis, lumbar region without neurogenic claudication: Secondary | ICD-10-CM | POA: Diagnosis not present

## 2023-11-25 DIAGNOSIS — M545 Low back pain, unspecified: Secondary | ICD-10-CM | POA: Diagnosis not present

## 2023-11-26 NOTE — Progress Notes (Signed)
 Pharmacy Quality Measure Review  This patient is appearing on a report for being at risk of failing the adherence measure for diabetes medications this calendar year.   Medication: Ozempic  1 mg weekly Last fill date: 03/31/2023 for 28 day supply  Patient has been receiving Ozempic  through [facility] PAP. No further action required.   Woodie Jock, PharmD PGY1 Pharmacy Resident  11/26/2023

## 2023-11-29 DIAGNOSIS — M545 Low back pain, unspecified: Secondary | ICD-10-CM | POA: Diagnosis not present

## 2023-11-29 DIAGNOSIS — M48061 Spinal stenosis, lumbar region without neurogenic claudication: Secondary | ICD-10-CM | POA: Diagnosis not present

## 2023-12-02 DIAGNOSIS — M545 Low back pain, unspecified: Secondary | ICD-10-CM | POA: Diagnosis not present

## 2023-12-02 DIAGNOSIS — M48061 Spinal stenosis, lumbar region without neurogenic claudication: Secondary | ICD-10-CM | POA: Diagnosis not present

## 2023-12-04 DIAGNOSIS — M545 Low back pain, unspecified: Secondary | ICD-10-CM | POA: Diagnosis not present

## 2023-12-04 DIAGNOSIS — M48061 Spinal stenosis, lumbar region without neurogenic claudication: Secondary | ICD-10-CM | POA: Diagnosis not present

## 2023-12-06 ENCOUNTER — Telehealth: Payer: Self-pay | Admitting: Family Medicine

## 2023-12-06 ENCOUNTER — Telehealth: Payer: Self-pay

## 2023-12-06 DIAGNOSIS — M545 Low back pain, unspecified: Secondary | ICD-10-CM | POA: Diagnosis not present

## 2023-12-06 DIAGNOSIS — M48061 Spinal stenosis, lumbar region without neurogenic claudication: Secondary | ICD-10-CM | POA: Diagnosis not present

## 2023-12-06 NOTE — Telephone Encounter (Signed)
 Pt called. LDVM on both numbers in the chart. Ozempic  placed in the fridge for pickup.

## 2023-12-06 NOTE — Progress Notes (Signed)
 12/06/2023 Name: Sierra Ford MRN: [id number] DOB: 05/05/1940  Chief Complaint  Patient presents with   Advice Only    Sierra Ford is a 83 y.o. year old female who presented for a telephone visit.   They were referred to the pharmacist by their PCP for assistance in managing medication access.    Subjective:  Medication Access/Adherence  Current Pharmacy:  [facility] - [place] ([place]), [place] - [address]  ([place]) [place] [address] Phone: [phone] Fax: [phone]  [id number] [place], [place] - [address] AT [facility] [address] [place] [address] Phone: [phone] Fax: [phone]  [facility] - [place], [place] - [address] [facility] AT Portal to Registered [facility] Sites [address] [place] [address] Phone: [phone] Fax: [phone]  [facility] - [place], [place] - [address] [place] [address] Phone: [phone] Fax: [phone]  Patient has been getting Ozempic  from [employer] patient assistance program. She has only 3 doses left and also received a letter from [employer] but patient is not sure what it means.  She also is asking if her assistance for Repatha  is still valid.     Objective:  Lab Results  Component Value Date   HGBA1C 6.3 08/29/2023    Lab Results  Component Value Date   CREATININE 0.74 08/29/2023   BUN 11 08/29/2023   NA 139 08/29/2023   K 4.3 08/29/2023   CL 101 08/29/2023   CO2 29 08/29/2023    Lab Results  Component Value Date   CHOL 187 08/29/2023   HDL 123.60 08/29/2023   LDLCALC 54 08/29/2023   LDLDIRECT 95.9 10/06/2012   TRIG 50.0 08/29/2023   CHOLHDL 2 08/29/2023    Medications Reviewed Today     Reviewed by Carla Milling, RPH-CPP (Pharmacist) on 12/06/23 at 1244  Med List Status: <None>   Medication Order Taking? Sig Documenting  Provider Last Dose Status Informant  apixaban  (ELIQUIS ) 2.5 MG TABS tablet [id number]  Take 1 tablet (2.5 mg total) by mouth 2 (two) times daily. Lavona Agent, MD  Active Self           Med Note Sierra Ford Pablo Apr 08, 2023  9:26 AM) Gets thru [employer] medication assistance  blood glucose meter kit and supplies KIT [id number]  Dispense based on patient and insurance preference. Use up to four times daily as directed. (FOR ICD-9 250.00, 250.01). Sierra Cyndee Jamee JONELLE, DO  Active Self  Calcium  Carb-Cholecalciferol (CALCIUM  600 + D PO) [id number]  Take 1 tablet by mouth daily. [provider]  Active Self  cholecalciferol (VITAMIN D3) 25 MCG (1000 UNIT) tablet [id number]  Take 1,000 Units by mouth 2 (two) times daily. [provider]  Active Self  Coenzyme Q10 100 MG TABS [id number]  Take 100 mg by mouth daily. [provider]  Active Self  diphenhydrAMINE HCl, Sleep, (ZZZQUIL) 25 MG CAPS [id number]  Take 25 mg by mouth at bedtime as needed (sleep). [provider]  Active Self  Evolocumab  (REPATHA  SURECLICK) 140 MG/ML SOAJ [id number]  INJECT 140 MG INTO THE SKIN EVERY 14 DAYS. Lavona Agent, MD  Active   Flaxseed, Linseed, (FLAXSEED OIL) 1200 MG CAPS [id number]  Take 1,200 mg by mouth daily. [provider]  Active Self  furosemide  (LASIX ) 40 MG tablet [id number]  TAKE 1 TABLET DAILY  Patient taking  differently: Take 40 mg by mouth daily as needed for fluid.   Lavona Agent, MD  Active   gabapentin  (NEURONTIN ) 300 MG capsule [id number]  Take 300 mg by mouth at bedtime as needed (pain/sleep). [provider]  Active Self  Insulin  Pen Needle (NOVOFINE PLUS PEN NEEDLE) 32G X 4 MM MISC [id number]  As directed Sierra Ford, Yvonne R, DO  Active Self  Multiple Vitamin (MULTIVITAMIN) capsule [id number]  Take 1 capsule by mouth daily with breakfast. [provider]  Active Self  ofloxacin  (FLOXIN ) 0.3 % OTIC solution [id number]  Place 10 drops into the  right ear daily. Sierra Cyndee Rockers R, DO  Active   Discontinued 05/07/11 1124 (Discontinued by provider) Omega-3 Fatty Acids (FISH OIL) 1000 MG CAPS [id number]  Take 1,000 mg by mouth daily. [provider]  Active Self  oxyCODONE  (OXY IR/ROXICODONE ) 5 MG immediate release tablet [id number]  Take 1 tablet (5 mg total) by mouth every 6 (six) hours as needed for moderate pain ((score 4 to 6)). Tomlinson, Sara Caylin, PA-C  Active   potassium chloride  SA (KLOR-CON  M) 20 MEQ tablet [id number]  Take 1 tablet (20 mEq total) by mouth daily. Sierra Cyndee Rockers R, DO  Active   Discontinued 05/07/11 1124 (Change in therapy) rosuvastatin  (CRESTOR ) 10 MG tablet [id number]  Take 1 tablet (10 mg total) by mouth daily. Sierra Cyndee Rockers R, DO  Active   Semaglutide , 1 MG/DOSE, 4 MG/3ML SOPN [id number]  Inject 1 mg as directed once a week. Sierra Cyndee Rockers JONELLE, DO  Active            Med Note Sierra, Sierra Ford   Mon Apr 29, 2023  9:51 AM) Approved for Novo patient assistance 04/2023 thru 02/19/2024  TRUE METRIX BLOOD GLUCOSE TEST test strip [id number]  TEST  UP  TO FOUR TIMES DAILY AS DIRECTED Sierra Ford, Yvonne R, DO  Active Self  TRUEPLUS LANCETS 33G MISC [id number]  TEST  UP TO FOUR TIMES DAILY AS DIRECTED Sierra Ford, Yvonne R, DO  Active Self              Assessment/Plan:   Medication Management / Access:  - Explained to patient that [employer]'s patient assistance program program has changed for 2026 and since her Medicare plan covers Ozempic , she will not be eligible for their patient assistance program plan for 2026.  - Reviewed patient's [employer] plan for 2026 if she continues with the same plan ([id number]) she will have a $450 deductible. Then Ozempic , Repatha  and Eliquis  will be $47/month. Patient has a Orthoptist for Repatha  that will cover her $450 deductible and the $47 copay for Repatha . She has been getting Eliquis  from [employer] thru her cardiology office. I suspect she might only have  $47 copay per month for Ozempic  for 2026.  - I have an appointment already scheduled for when she is due to submit re-enrollment for [facility] for November 2025 [facility] [employer] regarding her last shipment for 2025 for Ozempic . Received a tracking number for UPS - [phone]. Appears her shipment was received in the [facility] office this morning. She should receive a call once it is unpacked and processed.   Follow Up Plan: November 2025  Madelin Ray, PharmD Clinical Pharmacist [facility]  Population Health [phone]

## 2023-12-06 NOTE — Telephone Encounter (Signed)
 Copied from CRM [id number]. Topic: Clinical - Medication Question >> Dec 06, 2023  8:17 AM Robinson H wrote: Reason for CRM: Patient calling to speak with the Pharmacist Tammy at [facility] regarding some medication that she was supposed to check on for her.  Angelisa [phone]

## 2023-12-11 DIAGNOSIS — M48061 Spinal stenosis, lumbar region without neurogenic claudication: Secondary | ICD-10-CM | POA: Diagnosis not present

## 2023-12-11 DIAGNOSIS — M545 Low back pain, unspecified: Secondary | ICD-10-CM | POA: Diagnosis not present

## 2023-12-13 DIAGNOSIS — M545 Low back pain, unspecified: Secondary | ICD-10-CM | POA: Diagnosis not present

## 2023-12-13 DIAGNOSIS — M48061 Spinal stenosis, lumbar region without neurogenic claudication: Secondary | ICD-10-CM | POA: Diagnosis not present

## 2023-12-16 DIAGNOSIS — M48061 Spinal stenosis, lumbar region without neurogenic claudication: Secondary | ICD-10-CM | POA: Diagnosis not present

## 2023-12-16 DIAGNOSIS — M545 Low back pain, unspecified: Secondary | ICD-10-CM | POA: Diagnosis not present

## 2023-12-18 DIAGNOSIS — M545 Low back pain, unspecified: Secondary | ICD-10-CM | POA: Diagnosis not present

## 2023-12-18 DIAGNOSIS — M48061 Spinal stenosis, lumbar region without neurogenic claudication: Secondary | ICD-10-CM | POA: Diagnosis not present

## 2023-12-18 LAB — LAB REPORT - SCANNED
A1c: 6.3
EGFR: 71
TSH: 1.38 (ref 0.41–5.90)

## 2023-12-20 DIAGNOSIS — M48061 Spinal stenosis, lumbar region without neurogenic claudication: Secondary | ICD-10-CM | POA: Diagnosis not present

## 2023-12-20 DIAGNOSIS — M545 Low back pain, unspecified: Secondary | ICD-10-CM | POA: Diagnosis not present

## 2023-12-23 DIAGNOSIS — M545 Low back pain, unspecified: Secondary | ICD-10-CM | POA: Diagnosis not present

## 2023-12-23 DIAGNOSIS — M48061 Spinal stenosis, lumbar region without neurogenic claudication: Secondary | ICD-10-CM | POA: Diagnosis not present

## 2023-12-25 ENCOUNTER — Telehealth: Payer: Self-pay | Admitting: Cardiology

## 2023-12-25 DIAGNOSIS — I429 Cardiomyopathy, unspecified: Secondary | ICD-10-CM

## 2023-12-25 NOTE — Telephone Encounter (Signed)
 Pt c/o medication issue:  1. Name of Medication:     apixaban  (ELIQUIS ) 2.5 MG TABS tablet   2. How are you currently taking this medication (dosage and times per day)?   As prescribed  3. Are you having a reaction (difficulty breathing--STAT)?   4. What is your medication issue?   Patient stated she wants to re-apply for the [facility] program to get this medication and will need to get the forms.

## 2023-12-26 ENCOUNTER — Telehealth: Payer: Self-pay | Admitting: Pharmacist

## 2023-12-26 DIAGNOSIS — Z7985 Long-term (current) use of injectable non-insulin antidiabetic drugs: Secondary | ICD-10-CM | POA: Diagnosis not present

## 2023-12-26 DIAGNOSIS — Z7901 Long term (current) use of anticoagulants: Secondary | ICD-10-CM | POA: Diagnosis not present

## 2023-12-26 DIAGNOSIS — F419 Anxiety disorder, unspecified: Secondary | ICD-10-CM | POA: Diagnosis not present

## 2023-12-26 DIAGNOSIS — E785 Hyperlipidemia, unspecified: Secondary | ICD-10-CM | POA: Diagnosis not present

## 2023-12-26 DIAGNOSIS — Z86011 Personal history of benign neoplasm of the brain: Secondary | ICD-10-CM | POA: Diagnosis not present

## 2023-12-26 DIAGNOSIS — I700 Atherosclerosis of aorta: Secondary | ICD-10-CM | POA: Diagnosis not present

## 2023-12-26 DIAGNOSIS — Z9181 History of falling: Secondary | ICD-10-CM | POA: Diagnosis not present

## 2023-12-26 DIAGNOSIS — R32 Unspecified urinary incontinence: Secondary | ICD-10-CM | POA: Diagnosis not present

## 2023-12-26 DIAGNOSIS — E1142 Type 2 diabetes mellitus with diabetic polyneuropathy: Secondary | ICD-10-CM | POA: Diagnosis not present

## 2023-12-26 DIAGNOSIS — M1990 Unspecified osteoarthritis, unspecified site: Secondary | ICD-10-CM | POA: Diagnosis not present

## 2023-12-26 DIAGNOSIS — Z9989 Dependence on other enabling machines and devices: Secondary | ICD-10-CM | POA: Diagnosis not present

## 2023-12-26 DIAGNOSIS — I509 Heart failure, unspecified: Secondary | ICD-10-CM | POA: Diagnosis not present

## 2023-12-26 DIAGNOSIS — Z6831 Body mass index (BMI) 31.0-31.9, adult: Secondary | ICD-10-CM | POA: Diagnosis not present

## 2023-12-26 DIAGNOSIS — D6949 Other primary thrombocytopenia: Secondary | ICD-10-CM | POA: Diagnosis not present

## 2023-12-26 DIAGNOSIS — F325 Major depressive disorder, single episode, in full remission: Secondary | ICD-10-CM | POA: Diagnosis not present

## 2023-12-26 DIAGNOSIS — M461 Sacroiliitis, not elsewhere classified: Secondary | ICD-10-CM | POA: Diagnosis not present

## 2023-12-26 DIAGNOSIS — M4800 Spinal stenosis, site unspecified: Secondary | ICD-10-CM | POA: Diagnosis not present

## 2023-12-26 DIAGNOSIS — Z79891 Long term (current) use of opiate analgesic: Secondary | ICD-10-CM | POA: Diagnosis not present

## 2023-12-26 DIAGNOSIS — M2040 Other hammer toe(s) (acquired), unspecified foot: Secondary | ICD-10-CM | POA: Diagnosis not present

## 2023-12-26 DIAGNOSIS — E1136 Type 2 diabetes mellitus with diabetic cataract: Secondary | ICD-10-CM | POA: Diagnosis not present

## 2023-12-26 DIAGNOSIS — I110 Hypertensive heart disease with heart failure: Secondary | ICD-10-CM | POA: Diagnosis not present

## 2023-12-26 NOTE — Telephone Encounter (Signed)
 Patient left a message on VM of Clinical Pharmacist Practitioner that she received a letter from [employer] about her Eliquis  patient assistance program application. She mentioned it said something about not applying until she has out of pocket cost for 2026.  Tried to call patient back bu thad to leave message on her voicemail. Explained that with [employer] / Eliquis  she will need to spend 3% of her income out of pocket before they will enroll her in program. She will need to supply either pharmacy report or insurance EOB (estimation of benefits) that has this amount documented.

## 2023-12-27 ENCOUNTER — Telehealth: Payer: Self-pay | Admitting: Pharmacy Technician

## 2023-12-27 ENCOUNTER — Other Ambulatory Visit (HOSPITAL_COMMUNITY): Payer: Self-pay

## 2023-12-27 NOTE — Telephone Encounter (Signed)
 Patient Advocate Encounter   The patient was approved for a [employer] grant that will help cover the cost of Eliquis  Total amount awarded, 7500.00.  Effective: 11/27/23 - 11/25/24   APW:[id number] ERW:[id number] Hmnle:[id number] PI:[id number]  [employer] ID: [id number]   Pharmacy provided with approval and processing information. Patient informed via mychart

## 2023-12-27 NOTE — Telephone Encounter (Signed)
 Called patient and made her aware.

## 2023-12-30 DIAGNOSIS — M48061 Spinal stenosis, lumbar region without neurogenic claudication: Secondary | ICD-10-CM | POA: Diagnosis not present

## 2023-12-30 DIAGNOSIS — M545 Low back pain, unspecified: Secondary | ICD-10-CM | POA: Diagnosis not present

## 2024-01-01 DIAGNOSIS — M48061 Spinal stenosis, lumbar region without neurogenic claudication: Secondary | ICD-10-CM | POA: Diagnosis not present

## 2024-01-06 ENCOUNTER — Other Ambulatory Visit: Payer: Self-pay

## 2024-01-06 DIAGNOSIS — M545 Low back pain, unspecified: Secondary | ICD-10-CM | POA: Diagnosis not present

## 2024-01-06 DIAGNOSIS — M48061 Spinal stenosis, lumbar region without neurogenic claudication: Secondary | ICD-10-CM | POA: Diagnosis not present

## 2024-01-06 MED ORDER — APIXABAN 2.5 MG PO TABS: 2.5000 mg | ORAL_TABLET | Freq: Two times a day (BID) | ORAL | 5 refills | Status: DC

## 2024-01-06 NOTE — Telephone Encounter (Signed)
 Prescription refill request for Eliquis  received. Indication:pe Last office visit:5/25 Scr:0.74  7/25 Age: 83 Weight:79.4  kg  Prescription refilled

## 2024-01-07 ENCOUNTER — Other Ambulatory Visit: Payer: Self-pay | Admitting: Pharmacist

## 2024-01-07 ENCOUNTER — Ambulatory Visit: Payer: Self-pay

## 2024-01-07 NOTE — Progress Notes (Signed)
 01/07/2024 Name: Sierra Ford MRN: [id number] DOB: 07-Dec-1940  Chief Complaint  Patient presents with   Diabetes   Hyperlipidemia    Sierra Ford is a 83 y.o. year old female who presented for a telephone visit.   They were referred to the pharmacist by their PCP for assistance in managing diabetes, hyperlipidemia/cardiovascular risk reduction, and medication access.    Subjective:  Medication Access/Adherence  Current Pharmacy:  [facility] - [place] ([place]), [place] - [address] [place] ([place]) [place] [address] Phone: [phone] Fax: [phone]  [id number] [place], [place] - [address] AT [facility] [address] [place] [address] Phone: [phone] Fax: [phone]  [facility] - [place], [place] - [address] [facility] AT Portal to Registered [facility]mark Sites [address] [place] [place] [address] Phone: [phone] Fax: [phone]  [facility] - [place], [place] - [address] [place] [place] [address] Phone: [phone] Fax: [phone]   Patient reports affordability concerns with their medications: Yes  - difficulty paying for Repatha , Ozempic  and Eliquis  Patient reports access/transportation concerns to their pharmacy: No  Patient reports adherence concerns with their medications:  No      Diabetes:  Current medications: Ozempic  1mg  weekly    Patient denies hypoglycemic s/sx including no dizziness, shakiness, sweating. Patient denies hyperglycemic symptoms including no polyuria, polydipsia, polyphagia, nocturia, neuropathy, blurred vision.   Current medication access support: Ozempic  received from [employer]o Nordisk patient assistance program thru 02/19/2024  Macrovascular and Microvascular Risk Reduction:  Statin? yes (rosuvastatin  ); ACEi/ARB? Not currently  Last urinary albumin/creatinine ratio:  Lab Results   Component Value Date   MICRALBCREAT 18.9 08/29/2023   MICRALBCREAT 39.3 (H) 05/07/2023   MICRALBCREAT 9 11/10/2019   Last eye exam:  Lab Results  Component Value Date   HMDIABEYEEXA No Retinopathy 11/05/2019   Last foot exam: 09/04/2021 Tobacco Use:  Tobacco Use: Medium Risk (09/11/2023)   Patient History    Smoking Tobacco Use: Former    Smokeless Tobacco Use: Never    Passive Exposure: Not on file   Hyperlipidemia/ASCVD Risk Reduction  Current lipid lowering medications: rosuvastatin  10mg  daily, Repatha  140mg  every 14 days and  Omega 3 Fatty Acids 1000mg  daily    Current medication access support: Has [employer] Grant for hyperlipidemia / hypercholesterolemia to help with cost of Repatha    History of Pulmonary Embolism and CHF Anticoagulation Regimen: Eliquis  2.5mg  twice a day  Medications for CHF:  Furosemide  40mg  daily as needed    BP Readings from Last 3 Encounters:  09/11/23 134/80  08/29/23 134/80  07/02/23 (!) 140/80     Objective:  Lab Results  Component Value Date   HGBA1C 6.3 08/29/2023    Lab Results  Component Value Date   CREATININE 0.74 08/29/2023   BUN 11 08/29/2023   NA 139 08/29/2023   K 4.3 08/29/2023   CL 101 08/29/2023   CO2 29 08/29/2023    Lab Results  Component Value Date   CHOL 187 08/29/2023   HDL 123.60 08/29/2023   LDLCALC 54 08/29/2023   LDLDIRECT 95.9 10/06/2012   TRIG 50.0 08/29/2023   CHOLHDL 2 08/29/2023    Medications Reviewed Today     Reviewed by Carla Milling, RPH-CPP (Pharmacist) on 01/07/24 at 1252  Med List Status: <None>   Medication Order Taking? Sig Documenting Provider Last Dose Status Informant  apixaban  (ELIQUIS ) 2.5 MG TABS tablet [id number]  Take 1 tablet (2.5 mg total) by mouth 2 (two) times daily. Lavona Agent, MD  Active   blood glucose meter kit and supplies KIT [id number]  Dispense based on patient and insurance preference. Use up to four times daily as directed. (FOR ICD-9 250.00, 250.01).  Lowne Chase, Yvonne R, DO  Active Self  Calcium  Carb-Cholecalciferol (CALCIUM  600 + D PO) [id number]  Take 1 tablet by mouth daily. [provider]  Active Self  cholecalciferol (VITAMIN D3) 25 MCG (1000 UNIT) tablet [id number]  Take 1,000 Units by mouth 2 (two) times daily. [provider]  Active Self  Coenzyme Q10 100 MG TABS [id number]  Take 100 mg by mouth daily. [provider]  Active Self  diphenhydrAMINE HCl, Sleep, (ZZZQUIL) 25 MG CAPS [id number]  Take 25 mg by mouth at bedtime as needed (sleep). [provider]  Active Self  Evolocumab  (REPATHA  SURECLICK) 140 MG/ML SOAJ [id number]  INJECT 140 MG INTO THE SKIN EVERY 14 DAYS. Lavona Agent, MD  Active   Flaxseed, Linseed, (FLAXSEED OIL) 1200 MG CAPS [id number]  Take 1,200 mg by mouth daily. [provider]  Active Self  furosemide  (LASIX ) 40 MG tablet [id number]  TAKE 1 TABLET DAILY  Patient taking differently: Take 40 mg by mouth daily as needed for fluid.   Lavona Agent, MD  Active   gabapentin  (NEURONTIN ) 300 MG capsule [id number]  Take 300 mg by mouth at bedtime as needed (pain/sleep). [provider]  Active Self  Insulin  Pen Needle (NOVOFINE PLUS PEN NEEDLE) 32G X 4 MM MISC [id number]  As directed Lowne Chase, Yvonne R, DO  Active Self  Multiple Vitamin (MULTIVITAMIN) capsule [id number]  Take 1 capsule by mouth daily with breakfast. [provider]  Active Self  ofloxacin  (FLOXIN ) 0.3 % OTIC solution [id number]  Place 10 drops into the right ear daily. Antonio Cyndee Rockers R, DO  Active   Discontinued 05/07/11 1124 (Discontinued by provider) Omega-3 Fatty Acids (FISH OIL) 1000 MG CAPS [id number]  Take 1,000 mg by mouth daily. [provider]  Active Self  oxyCODONE  (OXY IR/ROXICODONE ) 5 MG immediate release tablet [id number]  Take 1 tablet (5 mg total) by mouth every 6 (six) hours as needed for moderate pain ((score 4 to 6)). Johnanna Credit Caylin, PA-C  Active   potassium  chloride SA (KLOR-CON  M) 20 MEQ tablet [id number]  Take 1 tablet (20 mEq total) by mouth daily. Antonio Cyndee Rockers R, DO  Active   Discontinued 05/07/11 1124 (Change in therapy) rosuvastatin  (CRESTOR ) 10 MG tablet [id number]  Take 1 tablet (10 mg total) by mouth daily. Antonio Cyndee Rockers JONELLE, DO  Active   Semaglutide , 1 MG/DOSE, 4 MG/3ML SOPN [id number]  Inject 1 mg as directed once a week. Lowne Chase, Yvonne R, DO  Active            Med Note JUSTINO, MADELIN KATHEE Kitchens Apr 29, 2023  9:51 AM) Approved for Novo patient assistance 04/2023 thru 02/19/2024  TRUE METRIX BLOOD GLUCOSE TEST test strip [id number]  TEST  UP  TO FOUR TIMES DAILY AS DIRECTED Lowne Chase, Yvonne R, DO  Active Self  TRUEPLUS LANCETS 33G MISC [id number]  TEST  UP TO FOUR TIMES DAILY AS DIRECTED Antonio Cyndee, Rockers JONELLE, DO  Active Self              Assessment/Plan:   Diabetes: - Currently controlled; goal A1c <7%. Blood pressure is at goal <130/80. LDL is at goal.  - Reviewed goal A1c,  goal fasting, and goal 2 hour post prandial glucose. Recommended to check glucose daily  - Recommend to continue Ozempic  1mg  weekly . - Discussed side effects of gastrointestinal upset/nausea; eating smaller meals, avoiding high-fat foods, and remaining upright after eating may reduce nausea. Discussed that overeating is a major trigger of nausea with this class of medications, as often times patients will start to feel full sooner and may need to decrease portion sizes from what they were previously accustomed to.   Hyperlipidemia/ASCVD Risk Reduction: - Currently controlled. LDL goal is < 70 - Reviewed long term complications of uncontrolled cholesterol - Recommend to continue Repatha  140mg  every 14 days and rosuvastatin  40mg  daily   - Sent documentation to [employer] regarding hypercholesterolemia diagnosis. Will need to renew grant in the next few weeks.   Medication Management: - Assisting with re-enrollment in [facility] for  Hypercholesterolemia / Hyperlipidemia.  - Patient's cardiologist office is assisting with Bethene Ferretti for cardiomyopathy / Eliquis  - sent message to pharmacy technician that it looks like they are also requesting diagnosis verification for this grant.    Follow Up Plan: 2 to 4 weeks.   Madelin Ray, PharmD Clinical Pharmacist [place] [facility]

## 2024-01-07 NOTE — Telephone Encounter (Signed)
 Pt is calling because she went to [facility] pharmacy but was informed that they have not received the RX. Pt is requesting for it to be sent to  [facility] - [place] ([place]), [place] - [address]

## 2024-01-07 NOTE — Telephone Encounter (Signed)
 FYI Only or Action Required?: Action required by provider: request for documentation or forms. Pt needs form verifying pts diagnosis completed and faxed back to [facility] in order to cover cost of pt medications. Form faxed to office today.   Patient was last seen in primary care on 08/29/2023 by Antonio Meth, Jamee SAUNDERS, DO.  Called Nurse Triage reporting Advice Only.  Symptoms began No triage.  Interventions attempted: Other: No triage.  Symptoms are: No triage.  Triage Disposition: Information or Advice Only Call (overriding Call PCP When Office is Open)  Patient/caregiver understands and will follow disposition?: Yes  Copied from CRM #[id number]. Topic: Clinical - Prescription Issue >> Jan 07, 2024  2:16 PM Rea ORN wrote: Reason for CRM: Tajel with [employer] calling to get ICD codes for Evolocumab  and Eliquis .  Reason for Disposition  [1] Caller requesting NON-URGENT health information AND [2] PCP's office is the best resource  Answer Assessment - Initial Assessment Questions 1. REASON FOR CALL: What is the main reason for your call? or How can I best help you?     Received call from Tajel, representative from [employer] (organization that issues grants to patients to assist with healthcare costs). Requesting  ICD codes r/t the following medication prescribed to pt: Eliquis , evolocumab , and ozempic . Representative reports pts PCP office will need to complete a diagnosis verification form and fax it back to them at [phone] as soon as possible in order to continue with financial assistance with these medications. Form has been faxed to office today at [phone]. If form not received at the office, it can be accessed online at [email or web address], click on the forms tab, and scroll down to the diagnosis verification form. Reports their call-back number is [phone].  2. SYMPTOMS : Do you have any symptoms?      N/a  3. OTHER QUESTIONS: Do you  have any other questions?     None  Protocols used: Information Only Call - No Triage-A-AH

## 2024-01-08 DIAGNOSIS — M545 Low back pain, unspecified: Secondary | ICD-10-CM | POA: Diagnosis not present

## 2024-01-08 DIAGNOSIS — I429 Cardiomyopathy, unspecified: Secondary | ICD-10-CM | POA: Insufficient documentation

## 2024-01-08 DIAGNOSIS — M48061 Spinal stenosis, lumbar region without neurogenic claudication: Secondary | ICD-10-CM | POA: Diagnosis not present

## 2024-01-10 ENCOUNTER — Telehealth: Payer: Self-pay | Admitting: Cardiology

## 2024-01-10 DIAGNOSIS — M48061 Spinal stenosis, lumbar region without neurogenic claudication: Secondary | ICD-10-CM | POA: Diagnosis not present

## 2024-01-10 DIAGNOSIS — M545 Low back pain, unspecified: Secondary | ICD-10-CM | POA: Diagnosis not present

## 2024-01-10 NOTE — Telephone Encounter (Signed)
*  STAT* If patient is at the pharmacy, call can be transferred to refill team.   1. Which medications need to be refilled? (please list name of each medication and dose if known) apixaban  (ELIQUIS ) 2.5 MG TABS tablet   2. Which pharmacy/location (including street and city if local pharmacy) is medication to be sent to? [facility] -  ([place]), [place] - [address]   3. Do they need a 30 day or 90 day supply? 90  Medication was sent to wrong Pharmacy, and patient it out of medication.

## 2024-01-13 NOTE — Telephone Encounter (Signed)
 Pt c/o medication issue:  1. Name of Medication: apixaban  (ELIQUIS ) 2.5 MG TABS tablet   2. How are you currently taking this medication (dosage and times per day)?Take 1 tablet (2.5 mg total) by mouth 2 (two) times daily.   3. Are you having a reaction (difficulty breathing--STAT)? No  4. What is your medication issue? Patient is requesting for this medication to be sent to [facility] - [place] ([place]), [place] - [address]. Patient is currently out of medication. Patient is requesting for a call back. Please advise.

## 2024-01-13 NOTE — Telephone Encounter (Signed)
 Spoke to patient she stated she was told Eliquis  grant is good for 1 year.Advised when I look in chart Eliquis  refill was sent to mail order pharmacy 11/17.Stated a neurosurgeon came from [place].She has not opened yet.She will call back tomorrow and let me know if she needs a refill.

## 2024-01-14 ENCOUNTER — Other Ambulatory Visit: Payer: Self-pay | Admitting: Pharmacist

## 2024-01-14 ENCOUNTER — Telehealth: Payer: Self-pay | Admitting: Pharmacist

## 2024-01-14 MED ORDER — APIXABAN 2.5 MG PO TABS: 2.5000 mg | ORAL_TABLET | Freq: Two times a day (BID) | ORAL | 3 refills | Status: AC

## 2024-01-14 NOTE — Telephone Encounter (Signed)
 Spoke to patient she stated she did receive 1 month supply of Eliquis  from [facility] mail order.Advised I will update prescription to 90 day supply and send to [facility]

## 2024-01-14 NOTE — Progress Notes (Signed)
 01/14/2024 Name: Sierra Ford MRN: [id number] DOB: 1940-11-21  Chief Complaint  Patient presents with   Medication Management   Diabetes    Sierra Ford is a 83 y.o. year old female who presented for a telephone visit.   They were referred to the pharmacist by their PCP for assistance in managing diabetes, hyperlipidemia/cardiovascular risk reduction, and medication access.    Subjective:  Medication Access/Adherence  Current Pharmacy:  [id number] - [place] - [address] [place] [address] Phone: [phone] Fax: [phone]  [id number] [place], [place] - [address] AT [facility] [address] [place] [address] Phone: [phone] Fax: [phone]  [facility] - [place], [place] - [address] [facility] AT Portal to Registered [facility] Sites [address] [facility] [address] [place] [address] Phone: [phone] Fax: [phone]  [facility] - [place], [place] - [address] [place] [address] Phone: [phone] Fax: [phone]   Patient reports affordability concerns with their medications: Yes  - difficulty paying for Repatha , Ozempic  and Eliquis  Patient reports access/transportation concerns to their pharmacy: No  Patient reports adherence concerns with their medications:  No      Diabetes:  Current medications: Ozempic  1mg  weekly    Patient denies hypoglycemic s/sx including no dizziness, shakiness, sweating. Patient denies hyperglycemic symptoms including no polyuria, polydipsia, polyphagia, nocturia, neuropathy, blurred vision.   Current medication access support: Ozempic  received from [employer] patient assistance program thru 02/19/2024  Macrovascular and Microvascular Risk Reduction:  Statin? yes (rosuvastatin  );Also taking Repatha  ACEi/ARB? Not currently  Last urinary  albumin/creatinine ratio:  Lab Results  Component Value Date   MICRALBCREAT 18.9 08/29/2023   MICRALBCREAT 39.3 (H) 05/07/2023   MICRALBCREAT 9 11/10/2019   Last eye exam:  Lab Results  Component Value Date   HMDIABEYEEXA No Retinopathy 11/05/2019   Last foot exam: 09/04/2021 Tobacco Use:  Tobacco Use: Medium Risk (09/11/2023)   Patient History    Smoking Tobacco Use: Former    Smokeless Tobacco Use: Never    Passive Exposure: Not on file   Hyperlipidemia/ASCVD Risk Reduction  Current lipid lowering medications: rosuvastatin  10mg  daily, Repatha  140mg  every 14 days and  Omega 3 Fatty Acids 1000mg  daily    Current medication access support: Has [employer] for hyperlipidemia / hypercholesterolemia to help with cost of Repatha  - we resubmitted re-enrollment - currently pending. We have submitted diagnosis verification form - waiting on approval.   History of Pulmonary Embolism and CHF Anticoagulation Regimen: Eliquis  2.5mg  twice a day  Medications for CHF:  Furosemide  40mg  daily as needed    BP Readings from Last 3 Encounters:  09/11/23 134/80  08/29/23 134/80  07/02/23 (!) 140/80     Objective:  Lab Results  Component Value Date   HGBA1C 6.3 08/29/2023    Lab Results  Component Value Date   CREATININE 0.74 08/29/2023   BUN 11 08/29/2023   NA 139 08/29/2023   K 4.3 08/29/2023   CL 101 08/29/2023   CO2 29 08/29/2023    Lab Results  Component Value Date   CHOL 187 08/29/2023   HDL 123.60 08/29/2023   LDLCALC 54 08/29/2023   LDLDIRECT 95.9 10/06/2012   TRIG 50.0 08/29/2023   CHOLHDL 2 08/29/2023    Medications Reviewed Today     Reviewed by Carla Milling, RPH-CPP (Pharmacist) on 01/14/24 at 1324  Med List Status: <None>  Medication Order Taking? Sig Documenting Provider Last Dose Status Informant  apixaban  (ELIQUIS ) 2.5 MG TABS tablet [id number]  Take 1 tablet (2.5 mg total) by mouth 2 (two) times daily. Lavona Agent, MD  Active   blood  glucose meter kit and supplies KIT [id number]  Dispense based on patient and insurance preference. Use up to four times daily as directed. (FOR ICD-9 250.00, 250.01). Antonio Cyndee Jamee JONELLE, DO  Active Self  Calcium  Carb-Cholecalciferol (CALCIUM  600 + D PO) [id number]  Take 1 tablet by mouth daily. [provider]  Active Self  cholecalciferol (VITAMIN D3) 25 MCG (1000 UNIT) tablet [id number]  Take 1,000 Units by mouth 2 (two) times daily. [provider]  Active Self  Coenzyme Q10 100 MG TABS [id number]  Take 100 mg by mouth daily. [provider]  Active Self  diphenhydrAMINE HCl, Sleep, (ZZZQUIL) 25 MG CAPS [id number]  Take 25 mg by mouth at bedtime as needed (sleep). [provider]  Active Self  Evolocumab  (REPATHA  SURECLICK) 140 MG/ML SOAJ [id number]  INJECT 140 MG INTO THE SKIN EVERY 14 DAYS. Lavona Agent, MD  Active   Flaxseed, Linseed, (FLAXSEED OIL) 1200 MG CAPS [id number]  Take 1,200 mg by mouth daily. [provider]  Active Self  furosemide  (LASIX ) 40 MG tablet [id number]  TAKE 1 TABLET DAILY  Patient taking differently: Take 40 mg by mouth daily as needed for fluid.   Lavona Agent, MD  Active   gabapentin  (NEURONTIN ) 300 MG capsule [id number]  Take 300 mg by mouth at bedtime as needed (pain/sleep). [provider]  Active Self  Insulin  Pen Needle (NOVOFINE PLUS PEN NEEDLE) 32G X 4 MM MISC [id number]  As directed Lowne Chase, Yvonne R, DO  Active Self  Multiple Vitamin (MULTIVITAMIN) capsule [id number]  Take 1 capsule by mouth daily with breakfast. [provider]  Active Self  ofloxacin  (FLOXIN ) 0.3 % OTIC solution [id number]  Place 10 drops into the right ear daily. Antonio Cyndee Jamee R, DO  Active   Discontinued 05/07/11 1124 (Discontinued by provider) Omega-3 Fatty Acids (FISH OIL) 1000 MG CAPS [id number]  Take 1,000 mg by mouth daily. [provider]  Active Self  oxyCODONE  (OXY IR/ROXICODONE ) 5 MG immediate release  tablet [id number]  Take 1 tablet (5 mg total) by mouth every 6 (six) hours as needed for moderate pain ((score 4 to 6)). Tomlinson, Sara Caylin, PA-C  Active   potassium chloride  SA (KLOR-CON  M) 20 MEQ tablet [id number]  Take 1 tablet (20 mEq total) by mouth daily. Antonio Cyndee Jamee R, DO  Active   Discontinued 05/07/11 1124 (Change in therapy) rosuvastatin  (CRESTOR ) 10 MG tablet [id number]  Take 1 tablet (10 mg total) by mouth daily. Antonio Cyndee Jamee JONELLE, DO  Active   Semaglutide , 1 MG/DOSE, 4 MG/3ML SOPN [id number]  Inject 1 mg as directed once a week. Lowne Chase, Yvonne R, DO  Active            Med Note JUSTINO, MADELIN KATHEE Kitchens Apr 29, 2023  9:51 AM) Approved for Novo patient assistance 04/2023 thru 02/19/2024  TRUE METRIX BLOOD GLUCOSE TEST test strip [id number]  TEST  UP  TO FOUR TIMES DAILY AS DIRECTED Lowne Chase, Yvonne R, DO  Active Self  TRUEPLUS LANCETS 33G MISC [id number]  TEST  UP TO FOUR TIMES DAILY AS DIRECTED Antonio Cyndee, Yvonne R, DO  Active Self              Assessment/Plan:   Diabetes: - Currently  controlled; goal A1c <7%. Blood pressure is at goal <130/80. LDL is at goal.  - Reviewed goal A1c, goal fasting, and goal 2 hour post prandial glucose. Recommended to check glucose daily  - Recommend to continue Ozempic  1mg  weekly . - Discussed side effects of gastrointestinal upset/nausea; eating smaller meals, avoiding high-fat foods, and remaining upright after eating may reduce nausea. Discussed that overeating is a major trigger of nausea with this class of medications, as often times patients will start to feel full sooner and may need to decrease portion sizes from what they were previously accustomed to.   Hyperlipidemia/ASCVD Risk Reduction: - Currently controlled. LDL goal is < 70 - Reviewed long term complications of uncontrolled cholesterol - Recommend to continue Repatha  140mg  every 14 days and rosuvastatin  40mg  daily   - Sent documentation to [employer]  regarding hypercholesterolemia diagnosis - still waiting on approval  Medication Management: - Patient's cardiologist office is assisting with Bethene Ferretti for cardiomyopathy / Eliquis  - sent message to pharmacy technician and clinical pharmacist to check into [facility] that they have applied for regarding Eliquis .     Follow Up Plan: 2 to 4 weeks.   Madelin Ray, PharmD Clinical Pharmacist [facility]

## 2024-01-14 NOTE — Telephone Encounter (Signed)
 Attempt was made to contact patient by phone today for follow up by Clinical Pharmacist regarding medication assistance Unable to reach patient. LM on VM with my contact number [phone].   Patient called back - see phone visit note.

## 2024-01-14 NOTE — Telephone Encounter (Signed)
 Spoke to patient she stated she did receive Eliquis  in mail.Advised I will change to a 90 day refill.

## 2024-01-14 NOTE — Telephone Encounter (Signed)
 [facility] is the pharmacy that provides Eliquis  through the [employer] patient assistance program program. Sierra Ford is approved thru 02/19/2024 which is likely why she only received 30 days of Eliquis  in her recent delivery.  In order to apply for BMS program in 2026 since she has Medicare she will have to spend 3% of her income out of pocket and then she can apply again.   There was a technician that started an application for a [employer] Grant fro Cardiomyopathy which included coverage for Eliquis  but they often request a diagnosis verification and it looks like that was denied (from what I can tell the diagnosis of atrial fibrillation was sent in).   I am including both Sierra Ford and [employer] who were working on this.

## 2024-01-14 NOTE — Telephone Encounter (Signed)
 Patient returned RN's call.

## 2024-01-15 DIAGNOSIS — M48061 Spinal stenosis, lumbar region without neurogenic claudication: Secondary | ICD-10-CM | POA: Diagnosis not present

## 2024-01-15 DIAGNOSIS — M545 Low back pain, unspecified: Secondary | ICD-10-CM | POA: Diagnosis not present

## 2024-01-20 DIAGNOSIS — M545 Low back pain, unspecified: Secondary | ICD-10-CM | POA: Diagnosis not present

## 2024-01-20 DIAGNOSIS — M48061 Spinal stenosis, lumbar region without neurogenic claudication: Secondary | ICD-10-CM | POA: Diagnosis not present

## 2024-01-21 ENCOUNTER — Other Ambulatory Visit: Payer: Self-pay | Admitting: Pharmacist

## 2024-01-21 NOTE — Progress Notes (Signed)
 01/21/2024 Name: Sierra Ford MRN: [id number] DOB: [birth year]  Chief Complaint  Patient presents with   Medication Management   Diabetes    Sierra Ford is a 83 y.o. year old female who presented for a telephone visit.   They were referred to the pharmacist by their PCP for assistance in managing diabetes, hyperlipidemia/cardiovascular risk reduction, and medication access.    Subjective:  Medication Access/Adherence  Current Pharmacy:  [facility] - [place] - [address] [place] ([place] [address] Phone: [phone] Fax: [phone]  [id number] [place], [place] - [address] AT [facility] [address] [place] [address] Phone: [phone] Fax: [phone]  [facility] - [place], [place] - [address] [facility] AT Portal to Registered [facility] Sites One [place] [address] Phone: [phone] Fax: [phone]  [facility] - [place], [place] - [address] [place] [address] Phone: [phone] Fax: [phone]   Patient reports affordability concerns with their medications: Yes  - difficulty paying for Repatha , Ozempic  and Eliquis  Patient reports access/transportation concerns to their pharmacy: No  Patient reports adherence concerns with their medications:  No      Diabetes:  Current medications: Ozempic  1mg  weekly    Patient denies hypoglycemic s/sx including no dizziness, shakiness, sweating. Patient denies hyperglycemic symptoms including no polyuria, polydipsia, polyphagia, nocturia, neuropathy, blurred vision.   Current medication access support: Ozempic  received from [employer] patient assistance program thru 02/19/2024  Macrovascular and Microvascular Risk Reduction:  Statin? yes (rosuvastatin  );Also taking Repatha  ACEi/ARB? Not currently  Last urinary  albumin/creatinine ratio:  Lab Results  Component Value Date   MICRALBCREAT 18.9 08/29/2023   MICRALBCREAT 39.3 (H) 05/07/2023   MICRALBCREAT 9 11/10/2019   Last eye exam:  Lab Results  Component Value Date   HMDIABEYEEXA No Retinopathy 11/05/2019   Last foot exam: 09/04/2021 Tobacco Use:  Tobacco Use: Medium Risk (09/11/2023)   Patient History    Smoking Tobacco Use: Former    Smokeless Tobacco Use: Never    Passive Exposure: Not on file   Hyperlipidemia/ASCVD Risk Reduction  Current lipid lowering medications: rosuvastatin  10mg  daily, Repatha  140mg  every 14 days and  Omega 3 Fatty Acids 1000mg  daily    Current medication access support: Has [employer] for hyperlipidemia / hypercholesterolemia to help with cost of Repatha  - we resubmitted re-enrollment - currently pending. We have submitted diagnosis verification form - waiting on approval.   History of Pulmonary Embolism and CHF Anticoagulation Regimen: Eliquis  2.5mg  twice a day  Medications for CHF:  Furosemide  40mg  daily as needed    BP Readings from Last 3 Encounters:  09/11/23 134/80  08/29/23 134/80  07/02/23 (!) 140/80     Objective:  Lab Results  Component Value Date   HGBA1C 6.3 08/29/2023    Lab Results  Component Value Date   CREATININE 0.74 08/29/2023   BUN 11 08/29/2023   NA 139 08/29/2023   K 4.3 08/29/2023   CL 101 08/29/2023   CO2 29 08/29/2023    Lab Results  Component Value Date   CHOL 187 08/29/2023   HDL 123.60 08/29/2023   LDLCALC 54 08/29/2023   LDLDIRECT 95.9 10/06/2012   TRIG 50.0 08/29/2023   CHOLHDL 2 08/29/2023    Medications Reviewed Today     Reviewed by Sierra Ford, Sierra Ford (Pharmacist) on 01/21/24 at 1628  Med List Status: <None>  Medication Order Taking? Sig Documenting Provider Last Dose Status Informant  apixaban  (ELIQUIS ) 2.5 MG TABS tablet [id number]  Take 1 tablet (2.5 mg total) by mouth 2 (two) times daily. Sierra Agent, MD  Active   blood  glucose meter kit and supplies KIT [id number]  Dispense based on patient and insurance preference. Use up to four times daily as directed. (FOR ICD-9 250.00, 250.01). Sierra Cyndee Jamee JONELLE, DO  Active Self  Calcium  Carb-Cholecalciferol (CALCIUM  600 + D PO) [id number]  Take 1 tablet by mouth daily. [provider]  Active Self  cholecalciferol (VITAMIN D3) 25 MCG (1000 UNIT) tablet [id number]  Take 1,000 Units by mouth 2 (two) times daily. [provider]  Active Self  Coenzyme Q10 100 MG TABS [id number]  Take 100 mg by mouth daily. [provider]  Active Self  diphenhydrAMINE HCl, Sleep, (ZZZQUIL) 25 MG CAPS [id number]  Take 25 mg by mouth at bedtime as needed (sleep). [provider]  Active Self  Evolocumab  (REPATHA  SURECLICK) 140 MG/ML SOAJ [id number]  INJECT 140 MG INTO THE SKIN EVERY 14 DAYS. Sierra Agent, MD  Active   Flaxseed, Linseed, (FLAXSEED OIL) 1200 MG CAPS [id number]  Take 1,200 mg by mouth daily. [provider]  Active Self  furosemide  (LASIX ) 40 MG tablet [id number]  TAKE 1 TABLET DAILY  Patient taking differently: Take 40 mg by mouth daily as needed for fluid.   Sierra Agent, MD  Active   gabapentin  (NEURONTIN ) 300 MG capsule [id number]  Take 300 mg by mouth at bedtime as needed (pain/sleep). [provider]  Active Self  Insulin  Pen Needle (NOVOFINE PLUS PEN NEEDLE) 32G X 4 MM MISC [id number]  As directed Sierra Chase, Sierra R, DO  Active Self  Multiple Vitamin (MULTIVITAMIN) capsule [id number]  Take 1 capsule by mouth daily with breakfast. [provider]  Active Self  ofloxacin  (FLOXIN ) 0.3 % OTIC solution [id number]  Place 10 drops into the right ear daily. Sierra Cyndee Jamee R, DO  Active   Discontinued 05/07/11 1124 (Discontinued by provider) Omega-3 Fatty Acids (FISH OIL) 1000 MG CAPS [id number]  Take 1,000 mg by mouth daily. [provider]  Active Self  oxyCODONE  (OXY IR/ROXICODONE ) 5 MG immediate release  tablet [id number]  Take 1 tablet (5 mg total) by mouth every 6 (six) hours as needed for moderate pain ((score 4 to 6)). Tomlinson, Sara Caylin, PA-C  Active   potassium chloride  SA (KLOR-CON  M) 20 MEQ tablet [id number]  Take 1 tablet (20 mEq total) by mouth daily. Sierra Cyndee Jamee R, DO  Active   Discontinued 05/07/11 1124 (Change in therapy) rosuvastatin  (CRESTOR ) 10 MG tablet [id number]  Take 1 tablet (10 mg total) by mouth daily. Sierra Chase, Sierra R, DO  Active   Semaglutide , 1 MG/DOSE, 4 MG/3ML SOPN [id number]  Inject 1 mg as directed once a week. Sierra Chase, Sierra R, DO  Active            Med Note JUSTINO, MADELIN KATHEE Kitchens Apr 29, 2023  9:51 AM) Approved for Novo patient assistance 04/2023 thru 02/19/2024  TRUE METRIX BLOOD GLUCOSE TEST test strip [id number]  TEST  UP  TO FOUR TIMES DAILY AS DIRECTED Sierra Chase, Sierra R, DO  Active Self  TRUEPLUS LANCETS 33G MISC [id number]  TEST  UP TO FOUR TIMES DAILY AS DIRECTED Sierra Cyndee, Sierra R, DO  Active Self              Assessment/Plan:   Diabetes: - Currently  controlled; goal A1c <7%. Blood pressure is at goal <130/80. LDL is at goal.  - Reviewed goal A1c, goal fasting, and goal 2 hour post prandial glucose. Recommended to check glucose daily  - Recommend to continue Ozempic  1mg  weekly . - Discussed side effects of gastrointestinal upset/nausea; eating smaller meals, avoiding high-fat foods, and remaining upright after eating may reduce nausea. Discussed that overeating is a major trigger of nausea with this class of medications, as often times patients will start to feel full sooner and may need to decrease portion sizes from what they were previously accustomed to.   Hyperlipidemia/ASCVD Risk Reduction: - Currently controlled. LDL goal is < 70 - Reviewed long term complications of uncontrolled cholesterol - Recommend to continue Repatha  140mg  every 14 days and rosuvastatin  40mg  daily   - Sent documentation to [employer]  regarding hypercholesterolemia diagnosis again- still waiting on approval  Medication Management: - Patient's cardiologist office is assisting with Bethene Ferretti for cardiomyopathy - approved.  - Called [facility] pharmacy. They have shipped order of Eliquis  today already. Patient should received in 5 to 7 business days.     Follow Up Plan: 2 to 4 weeks.   Madelin Ray, PharmD Clinical Pharmacist [facility] [place]

## 2024-01-22 DIAGNOSIS — M545 Low back pain, unspecified: Secondary | ICD-10-CM | POA: Diagnosis not present

## 2024-01-22 DIAGNOSIS — M48061 Spinal stenosis, lumbar region without neurogenic claudication: Secondary | ICD-10-CM | POA: Diagnosis not present

## 2024-01-29 DIAGNOSIS — M545 Low back pain, unspecified: Secondary | ICD-10-CM | POA: Diagnosis not present

## 2024-01-29 DIAGNOSIS — M48061 Spinal stenosis, lumbar region without neurogenic claudication: Secondary | ICD-10-CM | POA: Diagnosis not present

## 2024-03-03 ENCOUNTER — Encounter: Payer: Self-pay | Admitting: Family Medicine

## 2024-03-03 ENCOUNTER — Ambulatory Visit: Admitting: Family Medicine

## 2024-03-03 VITALS — BP 110/70 | HR 86 | Temp 97.8°F | Resp 18 | Ht 62.0 in | Wt 176.2 lb

## 2024-03-03 DIAGNOSIS — Z7985 Long-term (current) use of injectable non-insulin antidiabetic drugs: Secondary | ICD-10-CM | POA: Diagnosis not present

## 2024-03-03 DIAGNOSIS — I5032 Chronic diastolic (congestive) heart failure: Secondary | ICD-10-CM | POA: Diagnosis not present

## 2024-03-03 DIAGNOSIS — Z23 Encounter for immunization: Secondary | ICD-10-CM

## 2024-03-03 DIAGNOSIS — I10 Essential (primary) hypertension: Secondary | ICD-10-CM

## 2024-03-03 DIAGNOSIS — E785 Hyperlipidemia, unspecified: Secondary | ICD-10-CM

## 2024-03-03 DIAGNOSIS — E1169 Type 2 diabetes mellitus with other specified complication: Secondary | ICD-10-CM | POA: Diagnosis not present

## 2024-03-03 DIAGNOSIS — E119 Type 2 diabetes mellitus without complications: Secondary | ICD-10-CM | POA: Diagnosis not present

## 2024-03-03 DIAGNOSIS — D329 Benign neoplasm of meninges, unspecified: Secondary | ICD-10-CM

## 2024-03-03 LAB — COMPREHENSIVE METABOLIC PANEL WITH GFR
ALT: 20 U/L (ref 3–35)
AST: 19 U/L (ref 5–37)
Albumin: 4.2 g/dL (ref 3.5–5.2)
Alkaline Phosphatase: 111 U/L (ref 39–117)
BUN: 11 mg/dL (ref 6–23)
CO2: 27 meq/L (ref 19–32)
Calcium: 9.9 mg/dL (ref 8.4–10.5)
Chloride: 102 meq/L (ref 96–112)
Creatinine, Ser: 0.7 mg/dL (ref 0.40–1.20)
GFR: 80.52 mL/min
Glucose, Bld: 101 mg/dL — ABNORMAL HIGH (ref 70–99)
Potassium: 4.4 meq/L (ref 3.5–5.1)
Sodium: 140 meq/L (ref 135–145)
Total Bilirubin: 0.7 mg/dL (ref 0.2–1.2)
Total Protein: 6.9 g/dL (ref 6.0–8.3)

## 2024-03-03 LAB — LIPID PANEL
Cholesterol: 212 mg/dL — ABNORMAL HIGH (ref 28–200)
HDL: 137.9 mg/dL
LDL Cholesterol: 56 mg/dL (ref 10–99)
NonHDL: 74.48
Total CHOL/HDL Ratio: 2
Triglycerides: 90 mg/dL (ref 10.0–149.0)
VLDL: 18 mg/dL (ref 0.0–40.0)

## 2024-03-03 LAB — HEMOGLOBIN A1C: Hgb A1c MFr Bld: 6 % (ref 4.6–6.5)

## 2024-03-03 MED ORDER — SEMAGLUTIDE (1 MG/DOSE) 4 MG/3ML ~~LOC~~ SOPN: 1.0000 mg | PEN_INJECTOR | SUBCUTANEOUS | 2 refills | Status: DC

## 2024-03-03 NOTE — Assessment & Plan Note (Signed)
 Encourage heart healthy diet such as MIND or DASH diet, increase exercise, avoid trans fats, simple carbohydrates and processed foods, consider a krill or fish or flaxseed oil cap daily.

## 2024-03-03 NOTE — Progress Notes (Unsigned)
 "  Subjective:    Patient ID: Sierra Ford, female    DOB: March 25, 1940, 84 y.o.   MRN: [id number]  Chief Complaint  Patient presents with   Hyperlipidemia   Follow-up    HPI Patient is in today for f/u.  Discussed the use of AI scribe software for clinical note transcription with the patient, who gave verbal consent to proceed.  History of Present Illness Sierra Ford is an 84 year old female who presents with a persistent cough lasting three weeks.  She has been experiencing a deep cough for three weeks, described as 'really deep.' Despite using over-the-counter medications like Robitussin and Nyquil, she has been unable to expectorate any mucus. No associated symptoms such as fever, shortness of breath, or chest pain were reported.  She is currently managing her diabetes with Ozempic , administered weekly on Tuesdays. She has one dose left for the following week and has arranged for a refill at [facility]. There is a concern about the cost of the medication as she may need to start paying for it this year.  She experiences persistent back and left leg pain. She has had back surgery in the past but continues to have pain, with the left leg being particularly affected.  She mentioned needing a flu shot, which she has not yet received this season.    Past Medical History:  Diagnosis Date   Anxiety    Back pain    Chest pain    CLite with apical ischemia in 2006 - normal coronary arteries by [facility] in 12/2004;  Myoview  11/12:  Low risk stress nuclear study with a small, partially reversible apical defect most likely related to apical thinning; cannot R/O very mild apical ischemia.  EF: 75%    Complication of anesthesia    trouble waking after colonoscopy slept all day afterward into the next morning   Decreased hearing    Depression    Diabetes (HCC)    type II   DVT (deep venous thrombosis) (HCC) 2006   hx of and again in 2020   Ectopic pregnancy with intrauterine pregnancy     Hiatal hernia    patient not aware of hernia   Hyperlipidemia    PE (pulmonary embolism) 2006   hx of and again in 2020   Sleep apnea    CPAP     Past Surgical History:  Procedure Laterality Date   ABDOMINAL EXPLORATION SURGERY     ANTERIOR LAT LUMBAR FUSION N/A 07/24/2022   Procedure: Direct Lumbar Interbody Fusion PRONE TRANSPSOAS Lumbar three-four, Lumbar four-five;  Surgeon: Debby Dorn MATSU, MD;  Location: [facility] OR;  Service: Neurosurgery;  Laterality: N/A;   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/2023   [facility]   BUNIONECTOMY Right 1985   right   CARPAL TUNNEL RELEASE Right 02/22/2023   [facility]   COLONOSCOPY  2009   every 5 years   ECTOPIC PREGNANCY SURGERY     FOOT SURGERY Right 1985   LUMBAR PERCUTANEOUS PEDICLE SCREW 2 LEVEL N/A 07/24/2022   Procedure: LUMBAR PERCUTANEOUS PEDICLE SCREW LUMBAR THREE-FOUR, LUMBAR FOUR-FIVE;  Surgeon: Debby Dorn MATSU, MD;  Location: [facility] OR;  Service: Neurosurgery;  Laterality: N/A;   TUBAL LIGATION      Family History  Problem Relation Age of Onset   Heart failure Mother        died from   Colon polyps Mother    Hypertension Mother    Sudden death Mother    Obesity Mother    Alcoholism  Father    Diabetes Maternal Aunt        grandaughter   Diabetes Maternal Aunt    Colon cancer Neg Hx    Esophageal cancer Neg Hx    Stomach cancer Neg Hx     Social History   Socioeconomic History   Marital status: Divorced    Spouse name: Not on file   Number of children: 1   Years of education: Not on file   Highest education level: Not on file  Occupational History   Occupation: Retired Research scientist (physical sciences)  Tobacco Use   Smoking status: Former    Current packs/day: 0.00    Average packs/day: 1 pack/day for 50.0 years (50.0 ttl pk-yrs)    Types: Cigarettes    Start date: 11/22/1959    Quit date: 11/21/2009    Years since quitting: 14.2   Smokeless tobacco: Never  Vaping Use   Vaping status: Never Used  Substance and Sexual Activity    Alcohol use: Yes    Comment: rare 3 times a year per pt   Drug use: No   Sexual activity: Not Currently    Partners: Male  Other Topics Concern   Not on file  Social History Narrative   Regular exercise--- no   Social Drivers of Health   Tobacco Use: Medium Risk (03/03/2024)   Patient History    Smoking Tobacco Use: Former    Smokeless Tobacco Use: Never    Passive Exposure: Not on Actuary Strain: Low Risk (08/27/2022)   Overall Financial Resource Strain (CARDIA)    Difficulty of Paying Living Expenses: Not hard at all  Food Insecurity: No Food Insecurity (07/24/2022)   Hunger Vital Sign    Worried About Running Out of Food in the Last Year: Never true    Ran Out of Food in the Last Year: Never true  Transportation Needs: No Transportation Needs (07/24/2022)   PRAPARE - Administrator, Civil Service (Medical): No    Lack of Transportation (Non-Medical): No  Physical Activity: Inactive (08/27/2022)   Exercise Vital Sign    Days of Exercise per Week: 0 days    Minutes of Exercise per Session: 0 min  Stress: Not on file  Social Connections: Not on file  Intimate Partner Violence: Not At Risk (07/24/2022)   Humiliation, Afraid, Rape, and Kick questionnaire    Fear of Current or Ex-Partner: No    Emotionally Abused: No    Physically Abused: No    Sexually Abused: No  Depression (PHQ2-9): Low Risk (09/11/2023)   Depression (PHQ2-9)    PHQ-2 Score: 2  Alcohol Screen: Low Risk (08/27/2022)   Alcohol Screen    Last Alcohol Screening Score (AUDIT): 1  Housing: Low Risk (07/24/2022)   Housing    Last Housing Risk Score: 0  Utilities: Not At Risk (07/24/2022)   AHC Utilities    Threatened with loss of utilities: No  Health Literacy: Not on file    Outpatient Medications Prior to Visit  Medication Sig Dispense Refill   apixaban  (ELIQUIS ) 2.5 MG TABS tablet Take 1 tablet (2.5 mg total) by mouth 2 (two) times daily. 180 tablet 3   blood glucose meter kit and  supplies KIT Dispense based on patient and insurance preference. Use up to four times daily as directed. (FOR ICD-9 250.00, 250.01). 1 each 0   Calcium  Carb-Cholecalciferol (CALCIUM  600 + D PO) Take 1 tablet by mouth daily.     cholecalciferol (VITAMIN D3) 25  MCG (1000 UNIT) tablet Take 1,000 Units by mouth 2 (two) times daily.     Coenzyme Q10 100 MG TABS Take 100 mg by mouth daily.     diphenhydrAMINE HCl, Sleep, (ZZZQUIL) 25 MG CAPS Take 25 mg by mouth at bedtime as needed (sleep).     Evolocumab  (REPATHA  SURECLICK) 140 MG/ML SOAJ INJECT 140 MG INTO THE SKIN EVERY 14 DAYS. 6 mL 3   Flaxseed, Linseed, (FLAXSEED OIL) 1200 MG CAPS Take 1,200 mg by mouth daily.     furosemide  (LASIX ) 40 MG tablet TAKE 1 TABLET DAILY (Patient taking differently: Take 40 mg by mouth daily as needed for fluid.) 90 tablet 2   gabapentin  (NEURONTIN ) 300 MG capsule Take 300 mg by mouth at bedtime as needed (pain/sleep).     Insulin  Pen Needle (NOVOFINE PLUS PEN NEEDLE) 32G X 4 MM MISC As directed 100 each 1   Multiple Vitamin (MULTIVITAMIN) capsule Take 1 capsule by mouth daily with breakfast.     ofloxacin  (FLOXIN ) 0.3 % OTIC solution Place 10 drops into the right ear daily. 5 mL 0   Omega-3 Fatty Acids (FISH OIL) 1000 MG CAPS Take 1,000 mg by mouth daily.     oxyCODONE  (OXY IR/ROXICODONE ) 5 MG immediate release tablet Take 1 tablet (5 mg total) by mouth every 6 (six) hours as needed for moderate pain ((score 4 to 6)). 30 tablet 0   potassium chloride  SA (KLOR-CON  M) 20 MEQ tablet Take 1 tablet (20 mEq total) by mouth daily. 90 tablet 3   rosuvastatin  (CRESTOR ) 10 MG tablet Take 1 tablet (10 mg total) by mouth daily. 90 tablet 3   TRUE METRIX BLOOD GLUCOSE TEST test strip TEST  UP  TO FOUR TIMES DAILY AS DIRECTED 200 each 12   TRUEPLUS LANCETS 33G MISC TEST  UP TO FOUR TIMES DAILY AS DIRECTED 200 each 12   Semaglutide , 1 MG/DOSE, 4 MG/3ML SOPN Inject 1 mg as directed once a week. 3 mL 2   No facility-administered  medications prior to visit.    No Known Allergies  Review of Systems  Constitutional:  Negative for fever and malaise/fatigue.  HENT:  Negative for congestion.   Eyes:  Negative for blurred vision.  Respiratory:  Negative for shortness of breath.   Cardiovascular:  Negative for chest pain, palpitations and leg swelling.  Gastrointestinal:  Negative for abdominal pain, blood in stool and nausea.  Genitourinary:  Negative for dysuria and frequency.  Musculoskeletal:  Negative for falls.  Skin:  Negative for rash.  Neurological:  Negative for dizziness, loss of consciousness and headaches.  Endo/Heme/Allergies:  Negative for environmental allergies.  Psychiatric/Behavioral:  Negative for depression. The patient is not nervous/anxious.        Objective:    Physical Exam Vitals and nursing note reviewed.  Constitutional:      General: She is not in acute distress.    Appearance: Normal appearance. She is well-developed.  HENT:     Head: Normocephalic and atraumatic.     Right Ear: Tympanic membrane, ear canal and external ear normal. There is no impacted cerumen.     Left Ear: Tympanic membrane, ear canal and external ear normal. There is no impacted cerumen.     Nose: Nose normal.     Mouth/Throat:     Mouth: Mucous membranes are moist.     Pharynx: Oropharynx is clear. No oropharyngeal exudate or posterior oropharyngeal erythema.  Eyes:     General: No scleral icterus.  Right eye: No discharge.        Left eye: No discharge.     Conjunctiva/sclera: Conjunctivae normal.     Pupils: Pupils are equal, round, and reactive to light.  Neck:     Thyroid : No thyromegaly or thyroid  tenderness.     Vascular: No JVD.  Cardiovascular:     Rate and Rhythm: Normal rate and regular rhythm.     Heart sounds: Normal heart sounds. No murmur heard. Pulmonary:     Effort: Pulmonary effort is normal. No respiratory distress.     Breath sounds: Normal breath sounds.  Abdominal:      General: Bowel sounds are normal. There is no distension.     Palpations: Abdomen is soft. There is no mass.     Tenderness: There is no abdominal tenderness. There is no guarding or rebound.  Genitourinary:    Vagina: Normal.  Musculoskeletal:        General: Normal range of motion.     Cervical back: Normal range of motion and neck supple.     Right lower leg: No edema.     Left lower leg: No edema.  Lymphadenopathy:     Cervical: No cervical adenopathy.  Skin:    General: Skin is warm and dry.     Findings: No erythema or rash.  Neurological:     Mental Status: She is alert and oriented to person, place, and time.     Cranial Nerves: No cranial nerve deficit.     Deep Tendon Reflexes: Reflexes are normal and symmetric.  Psychiatric:        Mood and Affect: Mood normal.        Behavior: Behavior normal.        Thought Content: Thought content normal.        Judgment: Judgment normal.    BP 110/70 (BP Location: Right Arm, Patient Position: Sitting, Cuff Size: Large)   Pulse 86   Temp 97.8 F (36.6 C) (Oral)   Resp 18   Ht 5' 2 (1.575 m)   Wt 176 lb 3.2 oz (79.9 kg)   SpO2 97%   BMI 32.23 kg/m  Wt Readings from Last 3 Encounters:  03/03/24 176 lb 3.2 oz (79.9 kg)  09/11/23 175 lb (79.4 kg)  08/29/23 179 lb 9.6 oz (81.5 kg)    Diabetic Foot Exam - Simple   No data filed    Lab Results  Component Value Date   WBC 4.2 08/29/2023   HGB 14.3 08/29/2023   HCT 43.0 08/29/2023   PLT 232.0 08/29/2023   GLUCOSE 109 (H) 08/29/2023   CHOL 187 08/29/2023   TRIG 50.0 08/29/2023   HDL 123.60 08/29/2023   LDLDIRECT 95.9 10/06/2012   LDLCALC 54 08/29/2023   ALT 16 08/29/2023   AST 19 08/29/2023   NA 139 08/29/2023   K 4.3 08/29/2023   CL 101 08/29/2023   CREATININE 0.74 08/29/2023   BUN 11 08/29/2023   CO2 29 08/29/2023   TSH 1.38 12/17/2023   INR 2.0 (H) 01/29/2019   HGBA1C 6.3 08/29/2023   MICROALBUR 3.4 (H) 08/29/2023    Lab Results  Component Value Date    TSH 1.38 12/17/2023   Lab Results  Component Value Date   WBC 4.2 08/29/2023   HGB 14.3 08/29/2023   HCT 43.0 08/29/2023   MCV 96.3 08/29/2023   PLT 232.0 08/29/2023   Lab Results  Component Value Date   NA 139 08/29/2023   K 4.3 08/29/2023  CO2 29 08/29/2023   GLUCOSE 109 (H) 08/29/2023   BUN 11 08/29/2023   CREATININE 0.74 08/29/2023   BILITOT 0.8 08/29/2023   ALKPHOS 116 08/29/2023   AST 19 08/29/2023   ALT 16 08/29/2023   PROT 6.6 08/29/2023   ALBUMIN 4.3 08/29/2023   CALCIUM  9.8 08/29/2023   ANIONGAP 9 07/17/2022   EGFR 71.0 12/17/2023   GFR 75.60 08/29/2023   Lab Results  Component Value Date   CHOL 187 08/29/2023   Lab Results  Component Value Date   HDL 123.60 08/29/2023   Lab Results  Component Value Date   LDLCALC 54 08/29/2023   Lab Results  Component Value Date   TRIG 50.0 08/29/2023   Lab Results  Component Value Date   CHOLHDL 2 08/29/2023   Lab Results  Component Value Date   HGBA1C 6.3 08/29/2023       Assessment & Plan:  Hyperlipidemia associated with type 2 diabetes mellitus (HCC) -     Comprehensive metabolic panel with GFR -     Lipid panel  Diabetes mellitus treated with injections of non-insulin  medication (HCC) -     Semaglutide  (1 MG/DOSE); Inject 1 mg as directed once a week.  Dispense: 3 mL; Refill: 2 -     Comprehensive metabolic panel with GFR -     Hemoglobin A1c  Primary hypertension -     Comprehensive metabolic panel with GFR -     Lipid panel  Need for influenza vaccination -     Flu vaccine HIGH DOSE PF(Fluzone Trivalent)  Assessment and Plan Assessment & Plan Suspected pneumonia   Symptoms have persisted for three weeks with a deep cough and inability to expectorate. Differential diagnosis includes pneumonia, but confirmation is needed. Ordered stat chest x-ray to evaluate for pneumonia.  Type 2 diabetes mellitus   Currently managed with Ozempic . Discussed cost concerns and insurance coverage issues.  Continue Ozempic  for diabetes management. Checked for Ozempic  samples and called in prescription to pharmacy.  General health maintenance   Due for a flu shot. Discussed the importance of vaccination due to the severity of the flu season. Administered flu shot.    Zay Yeargan R Lowne Chase, DO  "

## 2024-03-03 NOTE — Assessment & Plan Note (Signed)
 hgba1c to be checked, minimize simple carbs. Increase exercise as tolerated. Continue current meds

## 2024-03-03 NOTE — Assessment & Plan Note (Signed)
 Per cardiology

## 2024-03-04 ENCOUNTER — Telehealth: Payer: Self-pay | Admitting: Pharmacist

## 2024-03-04 MED ORDER — OZEMPIC (0.25 OR 0.5 MG/DOSE) 2 MG/3ML ~~LOC~~ SOPN: 1.0000 mg | PEN_INJECTOR | SUBCUTANEOUS | Status: AC

## 2024-03-04 NOTE — Telephone Encounter (Signed)
 Reviewed patient's 2026 [facility] plan / benefits.  She has a $450 deductible, then Ozempic  cost will be $47/month. Patient states she cannot afford the deductible at this time.  Provided patient a sample of Ozempic  0.5mg  dose - she will take 2 injection of 0.5mg  = 1mg  weekly. In February she will be due to fill Repatha  She has a Orthoptist for hypercholesterolemia which will help to cover her deductible when she fills Repatha , then she can fill Ozempic  after and cost will for Ozempic  will be $47 / month

## 2024-03-04 NOTE — Telephone Encounter (Signed)
 Please see newer phone message. Patient LM on VM of Clinical Pharmacist Practitioner.

## 2024-03-04 NOTE — Telephone Encounter (Signed)
 Patient left message on VM of Clinical Pharmacist Practitioner regarding cost of Ozempic  for 2026 - Rx sent in yesterday was > $400.

## 2024-03-04 NOTE — Telephone Encounter (Signed)
 Patient has tried to reach out to you, I did provide her with your number to reach out to you later this morning. Please return her call at your earliest convience.

## 2024-03-06 DIAGNOSIS — D329 Benign neoplasm of meninges, unspecified: Secondary | ICD-10-CM | POA: Insufficient documentation

## 2024-03-06 NOTE — Assessment & Plan Note (Signed)
 S/p [facility]  07/2023 Per neurosurgery

## 2024-03-08 ENCOUNTER — Ambulatory Visit: Payer: Self-pay | Admitting: Family Medicine

## 2024-03-23 ENCOUNTER — Other Ambulatory Visit: Admitting: Pharmacist

## 2024-03-24 ENCOUNTER — Telehealth: Payer: Self-pay | Admitting: Pharmacist

## 2024-03-26 ENCOUNTER — Telehealth: Payer: Self-pay | Admitting: Pharmacist

## 2024-03-26 DIAGNOSIS — Z7985 Long-term (current) use of injectable non-insulin antidiabetic drugs: Secondary | ICD-10-CM

## 2024-03-26 MED ORDER — SEMAGLUTIDE (1 MG/DOSE) 4 MG/3ML ~~LOC~~ SOPN: 1.0000 mg | PEN_INJECTOR | SUBCUTANEOUS | 2 refills | Status: AC

## 2024-03-26 NOTE — Progress Notes (Signed)
 Patient called to request refill for Ozempic . Rx sent in. She also has questions about cost. It should be $47 since she met her deductible when she filled Repatha  03/23/2024.

## 2024-09-16 ENCOUNTER — Ambulatory Visit
